# Patient Record
Sex: Female | Born: 1955 | Race: White | Hispanic: No | Marital: Married | Smoking: Former smoker
Health system: Southern US, Community
[De-identification: ages and names within clinical notes are randomized; demographics above are authoritative.]

## PROBLEM LIST (undated history)

## (undated) DIAGNOSIS — E7800 Pure hypercholesterolemia, unspecified: Secondary | ICD-10-CM

## (undated) DIAGNOSIS — T8859XA Other complications of anesthesia, initial encounter: Secondary | ICD-10-CM

## (undated) DIAGNOSIS — J45909 Unspecified asthma, uncomplicated: Secondary | ICD-10-CM

## (undated) DIAGNOSIS — C50919 Malignant neoplasm of unspecified site of unspecified female breast: Secondary | ICD-10-CM

## (undated) DIAGNOSIS — G629 Polyneuropathy, unspecified: Secondary | ICD-10-CM

## (undated) DIAGNOSIS — F32A Depression, unspecified: Secondary | ICD-10-CM

## (undated) DIAGNOSIS — R569 Unspecified convulsions: Secondary | ICD-10-CM

## (undated) DIAGNOSIS — Z8619 Personal history of other infectious and parasitic diseases: Secondary | ICD-10-CM

## (undated) DIAGNOSIS — G35 Multiple sclerosis: Secondary | ICD-10-CM

## (undated) DIAGNOSIS — B029 Zoster without complications: Secondary | ICD-10-CM

## (undated) DIAGNOSIS — T7840XA Allergy, unspecified, initial encounter: Secondary | ICD-10-CM

## (undated) DIAGNOSIS — R911 Solitary pulmonary nodule: Secondary | ICD-10-CM

## (undated) DIAGNOSIS — K635 Polyp of colon: Secondary | ICD-10-CM

## (undated) DIAGNOSIS — R42 Dizziness and giddiness: Secondary | ICD-10-CM

## (undated) DIAGNOSIS — M722 Plantar fascial fibromatosis: Secondary | ICD-10-CM

## (undated) DIAGNOSIS — K5190 Ulcerative colitis, unspecified, without complications: Secondary | ICD-10-CM

## (undated) DIAGNOSIS — G709 Myoneural disorder, unspecified: Secondary | ICD-10-CM

## (undated) DIAGNOSIS — G35D Multiple sclerosis, unspecified: Secondary | ICD-10-CM

## (undated) DIAGNOSIS — L989 Disorder of the skin and subcutaneous tissue, unspecified: Principal | ICD-10-CM

## (undated) DIAGNOSIS — J449 Chronic obstructive pulmonary disease, unspecified: Secondary | ICD-10-CM

## (undated) DIAGNOSIS — N959 Unspecified menopausal and perimenopausal disorder: Secondary | ICD-10-CM

## (undated) DIAGNOSIS — F419 Anxiety disorder, unspecified: Secondary | ICD-10-CM

## (undated) DIAGNOSIS — K219 Gastro-esophageal reflux disease without esophagitis: Secondary | ICD-10-CM

## (undated) DIAGNOSIS — F329 Major depressive disorder, single episode, unspecified: Secondary | ICD-10-CM

## (undated) DIAGNOSIS — G40909 Epilepsy, unspecified, not intractable, without status epilepticus: Secondary | ICD-10-CM

## (undated) DIAGNOSIS — H269 Unspecified cataract: Secondary | ICD-10-CM

## (undated) DIAGNOSIS — K2970 Gastritis, unspecified, without bleeding: Secondary | ICD-10-CM

## (undated) HISTORY — DX: Epilepsy, unspecified, not intractable, without status epilepticus: G40.909

## (undated) HISTORY — DX: Polyp of colon: K63.5

## (undated) HISTORY — DX: Polyneuropathy, unspecified: G62.9

## (undated) HISTORY — DX: Unspecified cataract: H26.9

## (undated) HISTORY — PX: ROTATOR CUFF REPAIR: SHX139

## (undated) HISTORY — DX: Malignant neoplasm of unspecified site of unspecified female breast: C50.919

## (undated) HISTORY — DX: Dizziness and giddiness: R42

## (undated) HISTORY — DX: Allergy, unspecified, initial encounter: T78.40XA

## (undated) HISTORY — DX: Ulcerative colitis, unspecified, without complications: K51.90

## (undated) HISTORY — DX: Unspecified menopausal and perimenopausal disorder: N95.9

## (undated) HISTORY — PX: FOOT SURGERY: SHX648

## (undated) HISTORY — DX: Zoster without complications: B02.9

## (undated) HISTORY — DX: Anxiety disorder, unspecified: F41.9

## (undated) HISTORY — DX: Plantar fascial fibromatosis: M72.2

## (undated) HISTORY — PX: TONSILLECTOMY: SUR1361

## (undated) HISTORY — DX: Depression, unspecified: F32.A

## (undated) HISTORY — DX: Unspecified convulsions: R56.9

## (undated) HISTORY — DX: Disorder of the skin and subcutaneous tissue, unspecified: L98.9

## (undated) HISTORY — DX: Myoneural disorder, unspecified: G70.9

## (undated) HISTORY — DX: Multiple sclerosis, unspecified: G35.D

## (undated) HISTORY — PX: DILATION AND CURETTAGE OF UTERUS: SHX78

## (undated) HISTORY — PX: CHOLECYSTECTOMY: SHX55

## (undated) HISTORY — PX: UPPER GASTROINTESTINAL ENDOSCOPY: SHX188

## (undated) HISTORY — DX: Chronic obstructive pulmonary disease, unspecified: J44.9

## (undated) HISTORY — PX: TUBAL LIGATION: SHX77

## (undated) HISTORY — DX: Multiple sclerosis: G35

## (undated) HISTORY — DX: Personal history of other infectious and parasitic diseases: Z86.19

## (undated) HISTORY — DX: Gastro-esophageal reflux disease without esophagitis: K21.9

## (undated) HISTORY — DX: Major depressive disorder, single episode, unspecified: F32.9

## (undated) HISTORY — DX: Solitary pulmonary nodule: R91.1

## (undated) HISTORY — DX: Gastritis, unspecified, without bleeding: K29.70

---

## 1994-12-23 ENCOUNTER — Encounter (INDEPENDENT_AMBULATORY_CARE_PROVIDER_SITE_OTHER): Payer: Self-pay | Admitting: *Deleted

## 1999-03-12 ENCOUNTER — Other Ambulatory Visit: Admission: RE | Admit: 1999-03-12 | Discharge: 1999-03-12 | Payer: Self-pay | Admitting: Family Medicine

## 1999-04-06 ENCOUNTER — Encounter: Admission: RE | Admit: 1999-04-06 | Discharge: 1999-04-06 | Payer: Self-pay | Admitting: Family Medicine

## 1999-04-06 ENCOUNTER — Encounter: Payer: Self-pay | Admitting: Family Medicine

## 1999-06-11 ENCOUNTER — Other Ambulatory Visit: Admission: RE | Admit: 1999-06-11 | Discharge: 1999-06-11 | Payer: Self-pay | Admitting: Family Medicine

## 1999-07-10 ENCOUNTER — Encounter (INDEPENDENT_AMBULATORY_CARE_PROVIDER_SITE_OTHER): Payer: Self-pay | Admitting: Specialist

## 1999-07-10 ENCOUNTER — Other Ambulatory Visit: Admission: RE | Admit: 1999-07-10 | Discharge: 1999-07-10 | Payer: Self-pay | Admitting: *Deleted

## 1999-12-22 ENCOUNTER — Ambulatory Visit (HOSPITAL_COMMUNITY): Admission: RE | Admit: 1999-12-22 | Discharge: 1999-12-22 | Payer: Self-pay | Admitting: Neurology

## 1999-12-22 ENCOUNTER — Encounter: Payer: Self-pay | Admitting: Neurology

## 1999-12-23 ENCOUNTER — Encounter: Payer: Self-pay | Admitting: Internal Medicine

## 1999-12-23 ENCOUNTER — Emergency Department (HOSPITAL_COMMUNITY): Admission: EM | Admit: 1999-12-23 | Discharge: 1999-12-23 | Payer: Self-pay | Admitting: Emergency Medicine

## 1999-12-25 ENCOUNTER — Other Ambulatory Visit: Admission: RE | Admit: 1999-12-25 | Discharge: 1999-12-25 | Payer: Self-pay | Admitting: *Deleted

## 2002-03-08 HISTORY — PX: CERVICAL DISCECTOMY: SHX98

## 2002-03-27 ENCOUNTER — Emergency Department (HOSPITAL_COMMUNITY): Admission: EM | Admit: 2002-03-27 | Discharge: 2002-03-27 | Payer: Self-pay | Admitting: Emergency Medicine

## 2002-03-27 ENCOUNTER — Encounter: Payer: Self-pay | Admitting: Emergency Medicine

## 2002-05-08 ENCOUNTER — Encounter: Payer: Self-pay | Admitting: Neurology

## 2002-05-08 ENCOUNTER — Ambulatory Visit (HOSPITAL_COMMUNITY): Admission: RE | Admit: 2002-05-08 | Discharge: 2002-05-08 | Payer: Self-pay | Admitting: Neurology

## 2002-05-09 ENCOUNTER — Encounter: Payer: Self-pay | Admitting: Family Medicine

## 2002-05-09 ENCOUNTER — Encounter: Admission: RE | Admit: 2002-05-09 | Discharge: 2002-05-09 | Payer: Self-pay | Admitting: Family Medicine

## 2002-05-23 ENCOUNTER — Other Ambulatory Visit: Admission: RE | Admit: 2002-05-23 | Discharge: 2002-05-23 | Payer: Self-pay | Admitting: Family Medicine

## 2002-08-09 ENCOUNTER — Encounter: Payer: Self-pay | Admitting: Neurology

## 2002-08-09 ENCOUNTER — Ambulatory Visit (HOSPITAL_COMMUNITY): Admission: RE | Admit: 2002-08-09 | Discharge: 2002-08-09 | Payer: Self-pay | Admitting: Neurology

## 2002-08-23 ENCOUNTER — Encounter: Admission: RE | Admit: 2002-08-23 | Discharge: 2002-09-20 | Payer: Self-pay | Admitting: Neurology

## 2002-10-22 ENCOUNTER — Encounter: Payer: Self-pay | Admitting: Family Medicine

## 2002-11-07 ENCOUNTER — Inpatient Hospital Stay (HOSPITAL_COMMUNITY): Admission: RE | Admit: 2002-11-07 | Discharge: 2002-11-08 | Payer: Self-pay | Admitting: Neurosurgery

## 2002-11-07 ENCOUNTER — Encounter: Payer: Self-pay | Admitting: Neurosurgery

## 2003-01-24 ENCOUNTER — Encounter: Admission: RE | Admit: 2003-01-24 | Discharge: 2003-01-24 | Payer: Self-pay | Admitting: Neurosurgery

## 2003-07-03 ENCOUNTER — Encounter: Admission: RE | Admit: 2003-07-03 | Discharge: 2003-07-03 | Payer: Self-pay | Admitting: Family Medicine

## 2003-07-29 ENCOUNTER — Encounter: Payer: Self-pay | Admitting: Family Medicine

## 2003-07-29 ENCOUNTER — Other Ambulatory Visit: Admission: RE | Admit: 2003-07-29 | Discharge: 2003-07-29 | Payer: Self-pay | Admitting: Family Medicine

## 2003-07-29 LAB — CONVERTED CEMR LAB: Pap Smear: NORMAL

## 2003-08-09 ENCOUNTER — Encounter: Admission: RE | Admit: 2003-08-09 | Discharge: 2003-08-09 | Payer: Self-pay | Admitting: Family Medicine

## 2003-09-12 ENCOUNTER — Emergency Department (HOSPITAL_COMMUNITY): Admission: EM | Admit: 2003-09-12 | Discharge: 2003-09-12 | Payer: Self-pay | Admitting: Emergency Medicine

## 2003-11-15 ENCOUNTER — Encounter: Admission: RE | Admit: 2003-11-15 | Discharge: 2003-12-19 | Payer: Self-pay | Admitting: Neurology

## 2004-01-16 ENCOUNTER — Ambulatory Visit: Payer: Self-pay | Admitting: Family Medicine

## 2004-02-18 ENCOUNTER — Ambulatory Visit: Payer: Self-pay | Admitting: Family Medicine

## 2004-02-25 ENCOUNTER — Ambulatory Visit: Payer: Self-pay | Admitting: Family Medicine

## 2004-03-26 ENCOUNTER — Ambulatory Visit: Payer: Self-pay | Admitting: Family Medicine

## 2004-04-02 ENCOUNTER — Encounter: Admission: RE | Admit: 2004-04-02 | Discharge: 2004-04-02 | Payer: Self-pay | Admitting: Family Medicine

## 2004-04-02 ENCOUNTER — Ambulatory Visit: Payer: Self-pay | Admitting: Family Medicine

## 2004-07-03 ENCOUNTER — Ambulatory Visit: Payer: Self-pay | Admitting: Family Medicine

## 2004-07-03 ENCOUNTER — Encounter: Admission: RE | Admit: 2004-07-03 | Discharge: 2004-07-03 | Payer: Self-pay | Admitting: Family Medicine

## 2004-10-12 ENCOUNTER — Ambulatory Visit: Payer: Self-pay | Admitting: Family Medicine

## 2004-10-15 ENCOUNTER — Encounter: Admission: RE | Admit: 2004-10-15 | Discharge: 2004-10-15 | Payer: Self-pay | Admitting: Family Medicine

## 2004-11-23 ENCOUNTER — Ambulatory Visit (HOSPITAL_COMMUNITY): Admission: RE | Admit: 2004-11-23 | Discharge: 2004-11-23 | Payer: Self-pay | Admitting: Obstetrics and Gynecology

## 2004-11-23 ENCOUNTER — Encounter (INDEPENDENT_AMBULATORY_CARE_PROVIDER_SITE_OTHER): Payer: Self-pay | Admitting: Specialist

## 2004-12-29 ENCOUNTER — Ambulatory Visit: Payer: Self-pay | Admitting: Family Medicine

## 2005-01-13 ENCOUNTER — Ambulatory Visit: Payer: Self-pay | Admitting: Family Medicine

## 2005-03-27 ENCOUNTER — Emergency Department (HOSPITAL_COMMUNITY): Admission: EM | Admit: 2005-03-27 | Discharge: 2005-03-28 | Payer: Self-pay | Admitting: Emergency Medicine

## 2005-07-21 ENCOUNTER — Ambulatory Visit: Payer: Self-pay | Admitting: Family Medicine

## 2005-09-14 ENCOUNTER — Ambulatory Visit: Payer: Self-pay | Admitting: Family Medicine

## 2006-02-06 ENCOUNTER — Encounter: Payer: Self-pay | Admitting: Family Medicine

## 2006-02-06 ENCOUNTER — Emergency Department: Payer: Self-pay | Admitting: Emergency Medicine

## 2006-02-06 LAB — CONVERTED CEMR LAB: Blood Glucose, Fasting: 84 mg/dL

## 2006-02-10 ENCOUNTER — Ambulatory Visit: Payer: Self-pay | Admitting: Family Medicine

## 2006-03-09 ENCOUNTER — Ambulatory Visit: Payer: Self-pay | Admitting: General Surgery

## 2006-03-10 ENCOUNTER — Ambulatory Visit: Payer: Self-pay | Admitting: Family Medicine

## 2006-03-29 ENCOUNTER — Ambulatory Visit: Payer: Self-pay | Admitting: General Surgery

## 2006-04-21 ENCOUNTER — Ambulatory Visit: Payer: Self-pay | Admitting: Family Medicine

## 2006-04-21 LAB — CONVERTED CEMR LAB
ALT: 18 units/L (ref 0–40)
AST: 21 units/L (ref 0–37)
Albumin: 3.7 g/dL (ref 3.5–5.2)
Alkaline Phosphatase: 61 units/L (ref 39–117)
Basophils Absolute: 0 10*3/uL (ref 0.0–0.1)
Basophils Relative: 0.3 % (ref 0.0–1.0)
Bilirubin, Direct: 0.2 mg/dL (ref 0.0–0.3)
Eosinophils Absolute: 0.2 10*3/uL (ref 0.0–0.6)
Eosinophils Relative: 2.2 % (ref 0.0–5.0)
HCT: 41.1 % (ref 36.0–46.0)
Hemoglobin: 14.4 g/dL (ref 12.0–15.0)
Lymphocytes Relative: 28.2 % (ref 12.0–46.0)
MCHC: 35.1 g/dL (ref 30.0–36.0)
MCV: 97.3 fL (ref 78.0–100.0)
Monocytes Absolute: 0.4 10*3/uL (ref 0.2–0.7)
Monocytes Relative: 5.7 % (ref 3.0–11.0)
Neutro Abs: 4.6 10*3/uL (ref 1.4–7.7)
Neutrophils Relative %: 63.6 % (ref 43.0–77.0)
Platelets: 201 10*3/uL (ref 150–400)
RBC: 4.23 M/uL (ref 3.87–5.11)
RDW: 12.8 % (ref 11.5–14.6)
Total Bilirubin: 0.9 mg/dL (ref 0.3–1.2)
Total Protein: 6.8 g/dL (ref 6.0–8.3)
WBC: 7.2 10*3/uL (ref 4.5–10.5)

## 2006-04-28 ENCOUNTER — Encounter: Payer: Self-pay | Admitting: Family Medicine

## 2006-05-18 ENCOUNTER — Encounter: Payer: Self-pay | Admitting: Family Medicine

## 2006-09-30 ENCOUNTER — Encounter: Payer: Self-pay | Admitting: Family Medicine

## 2006-09-30 DIAGNOSIS — G35 Multiple sclerosis: Secondary | ICD-10-CM | POA: Insufficient documentation

## 2006-09-30 DIAGNOSIS — E162 Hypoglycemia, unspecified: Secondary | ICD-10-CM | POA: Insufficient documentation

## 2006-09-30 DIAGNOSIS — R42 Dizziness and giddiness: Secondary | ICD-10-CM | POA: Insufficient documentation

## 2006-09-30 DIAGNOSIS — R32 Unspecified urinary incontinence: Secondary | ICD-10-CM | POA: Insufficient documentation

## 2006-09-30 DIAGNOSIS — E7800 Pure hypercholesterolemia, unspecified: Secondary | ICD-10-CM | POA: Insufficient documentation

## 2006-09-30 DIAGNOSIS — R569 Unspecified convulsions: Secondary | ICD-10-CM | POA: Insufficient documentation

## 2006-09-30 DIAGNOSIS — K219 Gastro-esophageal reflux disease without esophagitis: Secondary | ICD-10-CM | POA: Insufficient documentation

## 2006-09-30 DIAGNOSIS — N6019 Diffuse cystic mastopathy of unspecified breast: Secondary | ICD-10-CM | POA: Insufficient documentation

## 2006-10-03 ENCOUNTER — Ambulatory Visit: Payer: Self-pay | Admitting: Family Medicine

## 2006-10-03 DIAGNOSIS — R609 Edema, unspecified: Secondary | ICD-10-CM | POA: Insufficient documentation

## 2006-10-03 LAB — CONVERTED CEMR LAB
Bilirubin Urine: NEGATIVE
Blood in Urine, dipstick: NEGATIVE
Glucose, Urine, Semiquant: NEGATIVE
Ketones, urine, test strip: NEGATIVE
Nitrite: NEGATIVE
Protein, U semiquant: NEGATIVE
Specific Gravity, Urine: 1.015
Urobilinogen, UA: 0.2
WBC Urine, dipstick: NEGATIVE
pH: 6

## 2006-10-04 ENCOUNTER — Encounter: Payer: Self-pay | Admitting: Family Medicine

## 2006-10-06 ENCOUNTER — Encounter (INDEPENDENT_AMBULATORY_CARE_PROVIDER_SITE_OTHER): Payer: Self-pay | Admitting: *Deleted

## 2006-10-06 LAB — CONVERTED CEMR LAB
ALT: 21 units/L (ref 0–35)
AST: 23 units/L (ref 0–37)
Albumin: 4.3 g/dL (ref 3.5–5.2)
Alkaline Phosphatase: 67 units/L (ref 39–117)
BUN: 11 mg/dL (ref 6–23)
Basophils Absolute: 0 10*3/uL (ref 0.0–0.1)
Basophils Relative: 0.4 % (ref 0.0–1.0)
Bilirubin, Direct: 0.1 mg/dL (ref 0.0–0.3)
CO2: 30 meq/L (ref 19–32)
Calcium: 10 mg/dL (ref 8.4–10.5)
Chloride: 104 meq/L (ref 96–112)
Creatinine, Ser: 1.1 mg/dL (ref 0.4–1.2)
Eosinophils Absolute: 0.3 10*3/uL (ref 0.0–0.6)
Eosinophils Relative: 3.1 % (ref 0.0–5.0)
GFR calc Af Amer: 68 mL/min
GFR calc non Af Amer: 56 mL/min
Glucose, Bld: 83 mg/dL (ref 70–99)
HCT: 42 % (ref 36.0–46.0)
Hemoglobin: 14.5 g/dL (ref 12.0–15.0)
Hgb A1c MFr Bld: 5.7 % (ref 4.6–6.0)
Lymphocytes Relative: 38.7 % (ref 12.0–46.0)
MCHC: 34.6 g/dL (ref 30.0–36.0)
MCV: 97.3 fL (ref 78.0–100.0)
Monocytes Absolute: 0.3 10*3/uL (ref 0.2–0.7)
Monocytes Relative: 3.8 % (ref 3.0–11.0)
Neutro Abs: 4.4 10*3/uL (ref 1.4–7.7)
Neutrophils Relative %: 54 % (ref 43.0–77.0)
Phosphorus: 4.6 mg/dL (ref 2.3–4.6)
Platelets: 188 10*3/uL (ref 150–400)
Potassium: 4.4 meq/L (ref 3.5–5.1)
RBC: 4.32 M/uL (ref 3.87–5.11)
RDW: 12.8 % (ref 11.5–14.6)
Sodium: 141 meq/L (ref 135–145)
TSH: 1.88 microintl units/mL (ref 0.35–5.50)
Total Bilirubin: 0.7 mg/dL (ref 0.3–1.2)
Total Protein: 7.4 g/dL (ref 6.0–8.3)
WBC: 8.2 10*3/uL (ref 4.5–10.5)

## 2006-10-17 ENCOUNTER — Ambulatory Visit: Payer: Self-pay | Admitting: Family Medicine

## 2006-10-17 DIAGNOSIS — G629 Polyneuropathy, unspecified: Secondary | ICD-10-CM | POA: Insufficient documentation

## 2006-10-17 DIAGNOSIS — F17200 Nicotine dependence, unspecified, uncomplicated: Secondary | ICD-10-CM | POA: Insufficient documentation

## 2006-10-17 DIAGNOSIS — Z87891 Personal history of nicotine dependence: Secondary | ICD-10-CM | POA: Insufficient documentation

## 2006-10-17 DIAGNOSIS — M79609 Pain in unspecified limb: Secondary | ICD-10-CM | POA: Insufficient documentation

## 2006-10-18 LAB — CONVERTED CEMR LAB
ALT: 18 units/L (ref 0–35)
AST: 26 units/L (ref 0–37)
Albumin: 3.6 g/dL (ref 3.5–5.2)
BUN: 10 mg/dL (ref 6–23)
CO2: 29 meq/L (ref 19–32)
Calcium: 9.2 mg/dL (ref 8.4–10.5)
Chloride: 105 meq/L (ref 96–112)
Creatinine, Ser: 1.1 mg/dL (ref 0.4–1.2)
Folate: 9.1 ng/mL
GFR calc Af Amer: 68 mL/min
GFR calc non Af Amer: 56 mL/min
Glucose, Bld: 106 mg/dL — ABNORMAL HIGH (ref 70–99)
Hgb A1c MFr Bld: 5.5 % (ref 4.6–6.0)
Phosphorus: 3.3 mg/dL (ref 2.3–4.6)
Potassium: 4.4 meq/L (ref 3.5–5.1)
Pro B Natriuretic peptide (BNP): 70 pg/mL (ref 0.0–100.0)
Sed Rate: 18 mm/hr (ref 0–25)
Sodium: 140 meq/L (ref 135–145)
TSH: 1.76 microintl units/mL (ref 0.35–5.50)
Vitamin B-12: 304 pg/mL (ref 211–911)

## 2007-02-07 ENCOUNTER — Ambulatory Visit: Payer: Self-pay | Admitting: Family Medicine

## 2007-02-07 LAB — CONVERTED CEMR LAB
Bilirubin Urine: NEGATIVE
Blood in Urine, dipstick: NEGATIVE
Casts: 0 /lpf
Epithelial cells, urine: 1 /lpf
Glucose, Urine, Semiquant: NEGATIVE
Ketones, urine, test strip: NEGATIVE
Nitrite: POSITIVE
Protein, U semiquant: NEGATIVE
Specific Gravity, Urine: 1.01
Urine crystals, microscopic: 0 /hpf
Urobilinogen, UA: 0.2
WBC Urine, dipstick: NEGATIVE
Yeast, UA: 0
pH: 6

## 2007-02-08 ENCOUNTER — Encounter: Payer: Self-pay | Admitting: Family Medicine

## 2007-02-13 ENCOUNTER — Encounter (INDEPENDENT_AMBULATORY_CARE_PROVIDER_SITE_OTHER): Payer: Self-pay | Admitting: *Deleted

## 2007-04-27 ENCOUNTER — Encounter: Admission: RE | Admit: 2007-04-27 | Discharge: 2007-04-27 | Payer: Self-pay | Admitting: Family Medicine

## 2007-05-03 ENCOUNTER — Encounter (INDEPENDENT_AMBULATORY_CARE_PROVIDER_SITE_OTHER): Payer: Self-pay | Admitting: *Deleted

## 2007-06-30 ENCOUNTER — Ambulatory Visit: Payer: Self-pay | Admitting: Family Medicine

## 2007-07-07 ENCOUNTER — Ambulatory Visit: Payer: Self-pay | Admitting: Cardiology

## 2007-07-14 ENCOUNTER — Ambulatory Visit: Payer: Self-pay

## 2007-07-14 ENCOUNTER — Encounter: Payer: Self-pay | Admitting: Family Medicine

## 2007-07-27 ENCOUNTER — Ambulatory Visit: Payer: Self-pay | Admitting: Family Medicine

## 2007-07-31 LAB — CONVERTED CEMR LAB
ALT: 24 units/L (ref 0–35)
AST: 24 units/L (ref 0–37)
Albumin: 4.1 g/dL (ref 3.5–5.2)
Alkaline Phosphatase: 72 units/L (ref 39–117)
BUN: 10 mg/dL (ref 6–23)
Basophils Absolute: 0 10*3/uL (ref 0.0–0.1)
Basophils Relative: 0.4 % (ref 0.0–1.0)
Bilirubin, Direct: 0.1 mg/dL (ref 0.0–0.3)
CO2: 26 meq/L (ref 19–32)
Calcium: 9.5 mg/dL (ref 8.4–10.5)
Chloride: 104 meq/L (ref 96–112)
Cholesterol: 141 mg/dL (ref 0–200)
Creatinine, Ser: 1 mg/dL (ref 0.4–1.2)
Eosinophils Absolute: 0.3 10*3/uL (ref 0.0–0.7)
Eosinophils Relative: 3.7 % (ref 0.0–5.0)
GFR calc Af Amer: 76 mL/min
GFR calc non Af Amer: 63 mL/min
Glucose, Bld: 100 mg/dL — ABNORMAL HIGH (ref 70–99)
HCT: 43.4 % (ref 36.0–46.0)
HDL: 41.6 mg/dL (ref >39.0)
Hemoglobin: 14.7 g/dL (ref 12.0–15.0)
LDL Cholesterol: 83 mg/dL (ref 0–99)
Lymphocytes Relative: 30.1 % (ref 12.0–46.0)
MCHC: 33.9 g/dL (ref 30.0–36.0)
MCV: 98.8 fL (ref 78.0–100.0)
Monocytes Absolute: 0.6 10*3/uL (ref 0.1–1.0)
Monocytes Relative: 7.6 % (ref 3.0–12.0)
Neutro Abs: 4.4 10*3/uL (ref 1.4–7.7)
Neutrophils Relative %: 58.2 % (ref 43.0–77.0)
Phosphorus: 3.7 mg/dL (ref 2.3–4.6)
Platelets: 181 10*3/uL (ref 150–400)
Potassium: 3.9 meq/L (ref 3.5–5.1)
RBC: 4.39 M/uL (ref 3.87–5.11)
RDW: 12.6 % (ref 11.5–14.6)
Sodium: 139 meq/L (ref 135–145)
TSH: 2.18 microintl units/mL (ref 0.35–5.50)
Total Bilirubin: 0.9 mg/dL (ref 0.3–1.2)
Total CHOL/HDL Ratio: 3.4
Total Protein: 7.2 g/dL (ref 6.0–8.3)
Triglycerides: 82 mg/dL (ref 0–149)
VLDL: 16 mg/dL (ref 0–40)
WBC: 7.6 10*3/uL (ref 4.5–10.5)

## 2007-08-01 ENCOUNTER — Ambulatory Visit: Payer: Self-pay | Admitting: Family Medicine

## 2007-08-01 DIAGNOSIS — J309 Allergic rhinitis, unspecified: Secondary | ICD-10-CM | POA: Insufficient documentation

## 2007-11-20 ENCOUNTER — Encounter: Payer: Self-pay | Admitting: Family Medicine

## 2007-11-22 ENCOUNTER — Encounter: Admission: RE | Admit: 2007-11-22 | Discharge: 2007-11-22 | Payer: Self-pay | Admitting: Family Medicine

## 2007-11-22 ENCOUNTER — Ambulatory Visit: Payer: Self-pay | Admitting: Family Medicine

## 2007-11-24 ENCOUNTER — Telehealth (INDEPENDENT_AMBULATORY_CARE_PROVIDER_SITE_OTHER): Payer: Self-pay | Admitting: *Deleted

## 2007-11-28 ENCOUNTER — Encounter: Payer: Self-pay | Admitting: Family Medicine

## 2007-12-12 ENCOUNTER — Ambulatory Visit: Payer: Self-pay | Admitting: Family Medicine

## 2007-12-12 DIAGNOSIS — M2550 Pain in unspecified joint: Secondary | ICD-10-CM | POA: Insufficient documentation

## 2007-12-12 LAB — CONVERTED CEMR LAB
Basophils Absolute: 0 10*3/uL (ref 0.0–0.1)
Basophils Relative: 0.3 % (ref 0.0–3.0)
Eosinophils Absolute: 0.2 10*3/uL (ref 0.0–0.7)
Eosinophils Relative: 3.3 % (ref 0.0–5.0)
HCT: 40.8 % (ref 36.0–46.0)
Hemoglobin: 14 g/dL (ref 12.0–15.0)
Lymphocytes Relative: 30.7 % (ref 12.0–46.0)
MCHC: 34.2 g/dL (ref 30.0–36.0)
MCV: 97.6 fL (ref 78.0–100.0)
Monocytes Absolute: 0.3 10*3/uL (ref 0.1–1.0)
Monocytes Relative: 4.7 % (ref 3.0–12.0)
Neutro Abs: 4.1 10*3/uL (ref 1.4–7.7)
Neutrophils Relative %: 61 % (ref 43.0–77.0)
Platelets: 170 10*3/uL (ref 150–400)
RBC: 4.18 M/uL (ref 3.87–5.11)
RDW: 12.8 % (ref 11.5–14.6)
Rhuematoid fact SerPl-aCnc: <20 intl units/mL — ABNORMAL LOW (ref 0.0–20.0)
Sed Rate: 23 mm/hr — ABNORMAL HIGH (ref 0–22)
WBC: 6.7 10*3/uL (ref 4.5–10.5)

## 2007-12-14 LAB — CONVERTED CEMR LAB: Anti Nuclear Antibody(ANA): NEGATIVE

## 2008-01-05 ENCOUNTER — Encounter: Payer: Self-pay | Admitting: Family Medicine

## 2008-03-22 ENCOUNTER — Ambulatory Visit: Payer: Self-pay | Admitting: Family Medicine

## 2008-03-22 DIAGNOSIS — M19019 Primary osteoarthritis, unspecified shoulder: Secondary | ICD-10-CM | POA: Insufficient documentation

## 2008-03-22 DIAGNOSIS — M719 Bursopathy, unspecified: Secondary | ICD-10-CM

## 2008-03-22 DIAGNOSIS — M67919 Unspecified disorder of synovium and tendon, unspecified shoulder: Secondary | ICD-10-CM | POA: Insufficient documentation

## 2008-03-27 ENCOUNTER — Encounter: Admission: RE | Admit: 2008-03-27 | Discharge: 2008-03-27 | Payer: Self-pay | Admitting: Family Medicine

## 2008-04-30 ENCOUNTER — Encounter: Admission: RE | Admit: 2008-04-30 | Discharge: 2008-04-30 | Payer: Self-pay | Admitting: Family Medicine

## 2008-05-02 ENCOUNTER — Encounter: Payer: Self-pay | Admitting: Family Medicine

## 2008-05-02 LAB — HM MAMMOGRAPHY: HM Mammogram: NORMAL

## 2008-05-20 ENCOUNTER — Telehealth: Payer: Self-pay | Admitting: Family Medicine

## 2008-06-01 DIAGNOSIS — Z9889 Other specified postprocedural states: Secondary | ICD-10-CM | POA: Insufficient documentation

## 2008-06-01 DIAGNOSIS — Z9089 Acquired absence of other organs: Secondary | ICD-10-CM | POA: Insufficient documentation

## 2008-06-01 DIAGNOSIS — Z9189 Other specified personal risk factors, not elsewhere classified: Secondary | ICD-10-CM | POA: Insufficient documentation

## 2008-06-01 DIAGNOSIS — Z8669 Personal history of other diseases of the nervous system and sense organs: Secondary | ICD-10-CM | POA: Insufficient documentation

## 2008-07-23 ENCOUNTER — Ambulatory Visit: Payer: Self-pay | Admitting: Family Medicine

## 2008-07-23 DIAGNOSIS — E559 Vitamin D deficiency, unspecified: Secondary | ICD-10-CM | POA: Insufficient documentation

## 2008-07-23 LAB — CONVERTED CEMR LAB
Bacteria, UA: 0
Bilirubin Urine: NEGATIVE
Blood in Urine, dipstick: NEGATIVE
Casts: 0 /lpf
Epithelial cells, urine: 1 /lpf
Glucose, Urine, Semiquant: NEGATIVE
Ketones, urine, test strip: NEGATIVE
Nitrite: NEGATIVE
Protein, U semiquant: NEGATIVE
RBC / HPF: 0
Specific Gravity, Urine: 1.015
Urine crystals, microscopic: 0 /hpf
Urobilinogen, UA: 0.2
WBC Urine, dipstick: NEGATIVE
WBC, UA: 0 cells/hpf
Yeast, UA: 0
pH: 6

## 2008-07-25 LAB — CONVERTED CEMR LAB
ALT: 15 units/L (ref 0–35)
AST: 21 units/L (ref 0–37)
Albumin: 3.7 g/dL (ref 3.5–5.2)
BUN: 11 mg/dL (ref 6–23)
Basophils Absolute: 0.1 10*3/uL (ref 0.0–0.1)
Basophils Relative: 0.9 % (ref 0.0–3.0)
CO2: 31 meq/L (ref 19–32)
Calcium: 9.6 mg/dL (ref 8.4–10.5)
Chloride: 104 meq/L (ref 96–112)
Cholesterol: 155 mg/dL (ref 0–200)
Creatinine, Ser: 0.9 mg/dL (ref 0.4–1.2)
Eosinophils Absolute: 0.1 10*3/uL (ref 0.0–0.7)
Eosinophils Relative: 2 % (ref 0.0–5.0)
Glucose, Bld: 97 mg/dL (ref 70–99)
HCT: 44 % (ref 36.0–46.0)
HDL: 50.6 mg/dL (ref >39.00)
Hemoglobin: 15.2 g/dL — ABNORMAL HIGH (ref 12.0–15.0)
Hgb A1c MFr Bld: 5.9 % (ref 4.6–6.5)
LDL Cholesterol: 89 mg/dL (ref 0–99)
Lymphocytes Relative: 33.9 % (ref 12.0–46.0)
Lymphs Abs: 2.1 10*3/uL (ref 0.7–4.0)
MCHC: 34.5 g/dL (ref 30.0–36.0)
MCV: 97.9 fL (ref 78.0–100.0)
Monocytes Absolute: 0.3 10*3/uL (ref 0.1–1.0)
Monocytes Relative: 5.4 % (ref 3.0–12.0)
Neutro Abs: 3.7 10*3/uL (ref 1.4–7.7)
Neutrophils Relative %: 57.8 % (ref 43.0–77.0)
Phosphorus: 3.8 mg/dL (ref 2.3–4.6)
Platelets: 144 10*3/uL — ABNORMAL LOW (ref 150.0–400.0)
Potassium: 4.8 meq/L (ref 3.5–5.1)
RBC: 4.5 M/uL (ref 3.87–5.11)
RDW: 12.1 % (ref 11.5–14.6)
Sodium: 140 meq/L (ref 135–145)
Total CHOL/HDL Ratio: 3
Triglycerides: 78 mg/dL (ref 0.0–149.0)
VLDL: 15.6 mg/dL (ref 0.0–40.0)
Vit D, 25-Hydroxy: 44 ng/mL (ref 30–89)
WBC: 6.3 10*3/uL (ref 4.5–10.5)

## 2008-07-30 ENCOUNTER — Ambulatory Visit: Payer: Self-pay | Admitting: Gastroenterology

## 2008-07-31 ENCOUNTER — Ambulatory Visit: Payer: Self-pay | Admitting: Family Medicine

## 2008-08-06 DIAGNOSIS — K635 Polyp of colon: Secondary | ICD-10-CM

## 2008-08-06 DIAGNOSIS — K2970 Gastritis, unspecified, without bleeding: Secondary | ICD-10-CM

## 2008-08-06 HISTORY — DX: Polyp of colon: K63.5

## 2008-08-06 HISTORY — PX: ESOPHAGOGASTRODUODENOSCOPY: SHX1529

## 2008-08-06 HISTORY — DX: Gastritis, unspecified, without bleeding: K29.70

## 2008-08-06 HISTORY — PX: COLONOSCOPY: SHX174

## 2008-08-14 ENCOUNTER — Ambulatory Visit: Payer: Self-pay | Admitting: Gastroenterology

## 2008-08-15 ENCOUNTER — Ambulatory Visit: Payer: Self-pay | Admitting: Gastroenterology

## 2008-08-15 ENCOUNTER — Encounter: Payer: Self-pay | Admitting: Gastroenterology

## 2008-08-15 LAB — HM COLONOSCOPY

## 2008-08-19 LAB — CONVERTED CEMR LAB
Fecal Occult Blood: NEGATIVE
OCCULT 1: NEGATIVE
OCCULT 2: NEGATIVE
OCCULT 3: NEGATIVE
OCCULT 4: NEGATIVE
OCCULT 5: NEGATIVE

## 2008-08-21 ENCOUNTER — Encounter: Payer: Self-pay | Admitting: Gastroenterology

## 2008-08-22 ENCOUNTER — Encounter: Payer: Self-pay | Admitting: Gastroenterology

## 2008-10-07 ENCOUNTER — Telehealth: Payer: Self-pay | Admitting: Gastroenterology

## 2008-10-08 ENCOUNTER — Ambulatory Visit: Payer: Self-pay | Admitting: Internal Medicine

## 2008-10-08 DIAGNOSIS — R109 Unspecified abdominal pain: Secondary | ICD-10-CM | POA: Insufficient documentation

## 2008-10-08 DIAGNOSIS — Z860100 Personal history of colon polyps, unspecified: Secondary | ICD-10-CM | POA: Insufficient documentation

## 2008-10-11 ENCOUNTER — Ambulatory Visit: Payer: Self-pay | Admitting: Cardiology

## 2008-10-14 ENCOUNTER — Ambulatory Visit: Payer: Self-pay | Admitting: Family Medicine

## 2008-10-14 DIAGNOSIS — R7303 Prediabetes: Secondary | ICD-10-CM | POA: Insufficient documentation

## 2008-10-14 DIAGNOSIS — J984 Other disorders of lung: Secondary | ICD-10-CM

## 2008-10-14 DIAGNOSIS — R911 Solitary pulmonary nodule: Secondary | ICD-10-CM | POA: Insufficient documentation

## 2008-10-28 ENCOUNTER — Ambulatory Visit: Payer: Self-pay | Admitting: Family Medicine

## 2008-10-30 LAB — CONVERTED CEMR LAB: Hgb A1c MFr Bld: 5.8 % (ref 4.6–6.5)

## 2008-11-06 HISTORY — PX: OTHER SURGICAL HISTORY: SHX169

## 2008-11-07 ENCOUNTER — Ambulatory Visit: Payer: Self-pay | Admitting: Gastroenterology

## 2008-11-07 DIAGNOSIS — K644 Residual hemorrhoidal skin tags: Secondary | ICD-10-CM | POA: Insufficient documentation

## 2008-11-07 DIAGNOSIS — K589 Irritable bowel syndrome without diarrhea: Secondary | ICD-10-CM | POA: Insufficient documentation

## 2008-11-12 ENCOUNTER — Ambulatory Visit: Payer: Self-pay | Admitting: Cardiology

## 2008-12-27 ENCOUNTER — Ambulatory Visit: Payer: Self-pay | Admitting: Family Medicine

## 2008-12-27 DIAGNOSIS — M545 Low back pain, unspecified: Secondary | ICD-10-CM | POA: Insufficient documentation

## 2008-12-31 ENCOUNTER — Encounter: Admission: RE | Admit: 2008-12-31 | Discharge: 2008-12-31 | Payer: Self-pay | Admitting: Family Medicine

## 2009-01-03 ENCOUNTER — Telehealth: Payer: Self-pay | Admitting: Family Medicine

## 2009-01-16 ENCOUNTER — Encounter: Admission: RE | Admit: 2009-01-16 | Discharge: 2009-01-16 | Payer: Self-pay | Admitting: Neurosurgery

## 2009-02-03 ENCOUNTER — Ambulatory Visit: Payer: Self-pay | Admitting: Family Medicine

## 2009-02-05 HISTORY — PX: CT SINUS LTD W/O CM: HXRAD914

## 2009-02-07 ENCOUNTER — Encounter: Admission: RE | Admit: 2009-02-07 | Discharge: 2009-02-25 | Payer: Self-pay | Admitting: Neurosurgery

## 2009-02-12 ENCOUNTER — Ambulatory Visit: Payer: Self-pay | Admitting: Family Medicine

## 2009-02-12 ENCOUNTER — Encounter (INDEPENDENT_AMBULATORY_CARE_PROVIDER_SITE_OTHER): Payer: Self-pay | Admitting: *Deleted

## 2009-02-14 ENCOUNTER — Ambulatory Visit: Payer: Self-pay | Admitting: Internal Medicine

## 2009-02-20 ENCOUNTER — Encounter: Admission: RE | Admit: 2009-02-20 | Discharge: 2009-02-20 | Payer: Self-pay | Admitting: Neurosurgery

## 2009-02-24 ENCOUNTER — Telehealth: Payer: Self-pay | Admitting: Family Medicine

## 2009-03-11 ENCOUNTER — Encounter: Payer: Self-pay | Admitting: Family Medicine

## 2009-03-19 ENCOUNTER — Telehealth (INDEPENDENT_AMBULATORY_CARE_PROVIDER_SITE_OTHER): Payer: Self-pay | Admitting: *Deleted

## 2009-03-21 ENCOUNTER — Ambulatory Visit (HOSPITAL_COMMUNITY): Admission: RE | Admit: 2009-03-21 | Discharge: 2009-03-22 | Payer: Self-pay | Admitting: Neurosurgery

## 2009-04-17 ENCOUNTER — Encounter: Admission: RE | Admit: 2009-04-17 | Discharge: 2009-04-17 | Payer: Self-pay | Admitting: Neurosurgery

## 2009-04-17 ENCOUNTER — Encounter: Payer: Self-pay | Admitting: Family Medicine

## 2009-05-05 ENCOUNTER — Emergency Department: Payer: Self-pay | Admitting: Emergency Medicine

## 2009-06-06 ENCOUNTER — Encounter: Admission: RE | Admit: 2009-06-06 | Discharge: 2009-06-06 | Payer: Self-pay | Admitting: Neurosurgery

## 2009-06-24 ENCOUNTER — Encounter: Payer: Self-pay | Admitting: Family Medicine

## 2009-07-06 HISTORY — PX: OTHER SURGICAL HISTORY: SHX169

## 2009-07-17 ENCOUNTER — Ambulatory Visit: Payer: Self-pay | Admitting: Family Medicine

## 2009-07-17 DIAGNOSIS — F419 Anxiety disorder, unspecified: Secondary | ICD-10-CM | POA: Insufficient documentation

## 2009-07-18 LAB — CONVERTED CEMR LAB
Albumin: 4.2 g/dL (ref 3.5–5.2)
BUN: 10 mg/dL (ref 6–23)
CO2: 29 meq/L (ref 19–32)
Calcium: 9.4 mg/dL (ref 8.4–10.5)
Chloride: 104 meq/L (ref 96–112)
Creatinine, Ser: 0.8 mg/dL (ref 0.4–1.2)
GFR calc non Af Amer: 76.88 mL/min (ref >60)
Glucose, Bld: 91 mg/dL (ref 70–99)
Phosphorus: 3.7 mg/dL (ref 2.3–4.6)
Potassium: 4.6 meq/L (ref 3.5–5.1)
Sodium: 141 meq/L (ref 135–145)

## 2009-07-21 ENCOUNTER — Encounter: Payer: Self-pay | Admitting: Family Medicine

## 2009-07-22 ENCOUNTER — Ambulatory Visit: Payer: Self-pay | Admitting: Internal Medicine

## 2009-09-01 ENCOUNTER — Ambulatory Visit: Payer: Self-pay | Admitting: Family Medicine

## 2009-09-02 ENCOUNTER — Telehealth: Payer: Self-pay | Admitting: Family Medicine

## 2009-09-02 ENCOUNTER — Ambulatory Visit: Payer: Self-pay | Admitting: Psychology

## 2009-09-10 ENCOUNTER — Ambulatory Visit: Payer: Self-pay | Admitting: Psychology

## 2009-09-19 ENCOUNTER — Ambulatory Visit: Payer: Self-pay | Admitting: Psychology

## 2009-09-24 ENCOUNTER — Ambulatory Visit: Payer: Self-pay | Admitting: Psychology

## 2009-10-01 ENCOUNTER — Telehealth: Payer: Self-pay | Admitting: Family Medicine

## 2009-10-01 ENCOUNTER — Ambulatory Visit: Payer: Self-pay | Admitting: Psychology

## 2009-10-08 ENCOUNTER — Ambulatory Visit: Payer: Self-pay | Admitting: Psychology

## 2009-10-13 ENCOUNTER — Ambulatory Visit: Payer: Self-pay | Admitting: Family Medicine

## 2009-10-15 ENCOUNTER — Encounter: Payer: Self-pay | Admitting: Family Medicine

## 2009-10-15 ENCOUNTER — Ambulatory Visit: Payer: Self-pay | Admitting: Psychology

## 2009-10-17 LAB — CONVERTED CEMR LAB
ALT: 20 units/L (ref 0–35)
AST: 24 units/L (ref 0–37)
Albumin: 4.1 g/dL (ref 3.5–5.2)
BUN: 11 mg/dL (ref 6–23)
CO2: 30 meq/L (ref 19–32)
Calcium: 10.1 mg/dL (ref 8.4–10.5)
Chloride: 99 meq/L (ref 96–112)
Cholesterol: 156 mg/dL (ref 0–200)
Creatinine, Ser: 0.9 mg/dL (ref 0.4–1.2)
GFR calc non Af Amer: 67.36 mL/min (ref >60)
Glucose, Bld: 76 mg/dL (ref 70–99)
HDL: 49.9 mg/dL (ref >39.00)
LDL Cholesterol: 80 mg/dL (ref 0–99)
Phosphorus: 3.1 mg/dL (ref 2.3–4.6)
Potassium: 4.7 meq/L (ref 3.5–5.1)
Sodium: 138 meq/L (ref 135–145)
Total CHOL/HDL Ratio: 3
Triglycerides: 130 mg/dL (ref 0.0–149.0)
VLDL: 26 mg/dL (ref 0.0–40.0)
Vit D, 25-Hydroxy: 45 ng/mL (ref 30–89)

## 2009-10-28 ENCOUNTER — Ambulatory Visit: Payer: Self-pay | Admitting: Psychology

## 2009-11-19 ENCOUNTER — Telehealth: Payer: Self-pay | Admitting: Family Medicine

## 2009-11-19 ENCOUNTER — Ambulatory Visit: Payer: Self-pay | Admitting: Psychology

## 2009-11-25 ENCOUNTER — Encounter: Payer: Self-pay | Admitting: Family Medicine

## 2009-11-26 ENCOUNTER — Ambulatory Visit: Payer: Self-pay | Admitting: Family Medicine

## 2009-11-28 ENCOUNTER — Encounter: Payer: Self-pay | Admitting: Family Medicine

## 2009-12-02 ENCOUNTER — Ambulatory Visit: Payer: Self-pay | Admitting: Psychology

## 2009-12-08 ENCOUNTER — Ambulatory Visit: Payer: Self-pay | Admitting: Otolaryngology

## 2010-01-27 ENCOUNTER — Telehealth: Payer: Self-pay | Admitting: Family Medicine

## 2010-04-02 ENCOUNTER — Ambulatory Visit
Admission: RE | Admit: 2010-04-02 | Discharge: 2010-04-02 | Payer: Self-pay | Source: Home / Self Care | Attending: Family Medicine | Admitting: Family Medicine

## 2010-04-02 DIAGNOSIS — H0289 Other specified disorders of eyelid: Secondary | ICD-10-CM | POA: Insufficient documentation

## 2010-04-02 LAB — CONVERTED CEMR LAB
Bacteria, UA: 0
Bilirubin Urine: NEGATIVE
Blood in Urine, dipstick: NEGATIVE
Casts: 0 /lpf
Glucose, Urine, Semiquant: NEGATIVE
Ketones, urine, test strip: NEGATIVE
Mucus, UA: 0
Nitrite: NEGATIVE
Protein, U semiquant: NEGATIVE
RBC / HPF: 0
Specific Gravity, Urine: 1.015
Urine crystals, microscopic: 0 /hpf
Urobilinogen, UA: 0.2
WBC Urine, dipstick: NEGATIVE
WBC, UA: 0 cells/hpf
Yeast, UA: 0
pH: 6

## 2010-04-06 ENCOUNTER — Encounter: Payer: Self-pay | Admitting: Family Medicine

## 2010-04-09 NOTE — Miscellaneous (Signed)
Summary: mammo results   Clinical Lists Changes  Observations: Added new observation of MAMMO DUE: 05/2008 (05/03/2007 8:19) Added new observation of MAMMOGRAM: normal (05/03/2007 8:19)       Preventive Care Screening  Mammogram:    Date:  05/03/2007    Next Due:  05/2008    Results:  normal

## 2010-04-09 NOTE — Assessment & Plan Note (Signed)
Summary: ?SINUS INFECTION/CLE   Vital Signs:  Patient profile:   55 year old female Height:      61.5 inches Weight:      185.50 pounds BMI:     34.61 Temp:     97.9 degrees F oral Pulse rate:   76 / minute Pulse rhythm:   regular BP sitting:   118 / 88  (right arm) Cuff size:   large  Vitals Entered By: Lewanda Rife LPN (Jul 17, 2009 8:47 AM) CC: sinus drainage at back of throat,face hurts and h/a. also wants med for nerves   History of Present Illness: allergies have turned into infectio - now with chest congestion  drainage in throat  some yellow nasal drainage coughs up a lot of yellow d/c  face hurts under eyes  lots of headaches  has been sick for 10 days  no fever   thinks a lot of her headaches are stress driven   (did see ENT this year who recommeded headache workup if not imp)  is very irritable  ? if hormonal  wants to isolate herself  out of prozac  wants to try going up on dose   ? if menopause had novasure years ago - no periods so unsure if menop    still smokes the same- wants to cut down   mole on chest -- brown freckle is larger and darker area below it pink and swollen  wants derm eval  Allergies: 1)  ! Amitriptyline Hcl 2)  ! Lyrica 3)  Biaxin 4)  Lipitor 5)  Sudafed 6)  * Cymbalta 7)  Levaquin  Past History:  Past Surgical History: Last updated: 02/16/2009 ANTERIOR CERVICAL DISKECTOMY and fusion, c4-5, c5-6, c6-7. Dr. Donalee Citrin. 2004 TUBAL LIGATION, HX OF (ICD-V26.51) DILATION AND CURETTAGE, HX OF (ICD-V45.89) TONSILLECTOMY, HX OF (ICD-V45.79) FOOT SURGERY, HX OF (ICD-V15.89) CHOLECYSTECTOMY, HX OF (ICD-V45.79) 9/10 chest CT with small 4mm nodule L lung base (rec re check in 1 yr) 12/10 sinus CT negative  6/10 colonosco- polyps /- re check 5y 6/10 EGD- erosive gastritis , h pylori (treated )  Family History: Last updated: 07/30/2008 Father: CAD 6 bypass Mother: migraines, CAD - died of MI Siblings: 2 brothers, 1  sister cousin- CAD gm- CAD aunt- colon cancer 52s MGF- colon cancer 74s  Social History: Last updated: 07/30/2008 Marital Status: Married Children: 3 Occupation: disabled- from [place] smokes 1/2 to 1ppd  Alcohol Use - no Daily Caffeine Use-2  Risk Factors: Smoking Status: quit (09/30/2006)  Past Medical History: Dizziness or vertigo- chronic  GERD Seizure disorder Urinary incontinence MS plantar fasciitis s/p sx allergic rhinitis ADENOMATOUS COLON POLYPS 08/2008 GASTRITIS /H.PYLORI +08/2008 lung nodule  menopausal disorder anxiety  cardilogy--Dr [facility] neurology-- Dr Sandria Manly   Review of Systems General:  Complains of fatigue and malaise; denies chills, fever, and loss of appetite. Eyes:  Denies blurring, discharge, and eye irritation. ENT:  Complains of earache, nasal congestion, postnasal drainage, sinus pressure, and sore throat; denies ear discharge. CV:  Denies chest pain or discomfort, palpitations, shortness of breath with exertion, and swelling of feet. Resp:  Complains of cough and sputum productive; denies wheezing. GI:  Denies abdominal pain, bloody stools, change in bowel habits, indigestion, nausea, and vomiting. GU:  Denies dysuria and hematuria. MS:  Complains of cramps and stiffness; denies joint redness, joint swelling, and muscle weakness. Derm:  Complains of lesion(s); denies itching, poor wound healing, and rash. Neuro:  Denies numbness and tingling. Psych:  Complains of anxiety,  depression, easily tearful, and irritability; denies panic attacks and sense of great danger. Endo:  Denies cold intolerance and heat intolerance. Heme:  Denies abnormal bruising and bleeding.  Physical Exam  General:  overweight but generally well appearing - seems mildly fatigued  Head:  normocephalic, atraumatic, and no abnormalities observed.  bilat ethmoid and maxillary sinus tenderness  Eyes:  vision grossly intact, pupils equal, pupils round, and pupils reactive to  light.  mild conj injection  Ears:  R ear normal and L ear normal.   Nose:  nares are injected and congested bilaterally  Mouth:  pharynx pink and moist, no erythema, and no exudates.  some clear post nasal drip Neck:  supple with full rom and no masses or thyromegally, no JVD or carotid bruit  Chest Wall:  No deformities, masses, or tenderness noted. Lungs:  CTA with diffusely distant bs no wheeze today no rales or rhonchi Heart:  Normal rate and regular rhythm. S1 and S2 normal without gallop, murmur, click, rub or other extra sounds. Abdomen:  soft, non-tender, and normal bowel sounds.   Msk:  No deformity or scoliosis noted of thoracic or lumbar spine.  no new joint changes  Extremities:  trace left pedal edema and trace right pedal edema.   Neurologic:  sensation intact to light touch, gait normal, and DTRs symmetrical and normal.  no tremor  Skin:  dark 3-4 mm macule on mid chest raised pink 2 mmm area under it  tanned - solar change and aging Cervical Nodes:  No lymphadenopathy noted Inguinal Nodes:  No significant adenopathy Psych:  generally anxious today fair eye contact and comm skills good insignt    Impression & Recommendations:  Problem # 1:  NEOPLASM, SKIN, UNCERTAIN BEHAVIOR (ICD-238.2) ref to derm for maclule and raised lesion on chest Orders: Dermatology Referral (Derma) Prescription Created Electronically [phone])  Problem # 2:  LUNG NODULE (ICD-518.89) re check for change in smoker Orders: Venipuncture (21308) TLB-Renal Function Panel (80069-RENAL) Radiology Referral (Radiology) Prescription Created Electronically [phone])  Problem # 3:  SINUSITIS - ACUTE-NOS (ICD-461.9)  tx with augmentin adv to quit smoking  The following medications were removed from the medication list:    Augmentin 875-125 Mg Tabs (Amoxicillin-pot clavulanate) .Marland Kitchen... 1 by mouth two times a day for 10 days    Zithromax Z-pak 250 Mg Tabs (Azithromycin) .Marland Kitchen... Take by mouth as  directed Her updated medication list for this problem includes:    Flonase 50 Mcg/act Susp (Fluticasone propionate) .Marland Kitchen... 2 sprays in each nostril daily as needed    Robitussin Chest Congestion 100 Mg/75ml Syrp (Guaifenesin) ..... Otc as directed.    Guaifenesin-codeine 100-10 Mg/50ml Syrp (Guaifenesin-codeine) .Marland Kitchen... 1-2 teaspoon by mouth up to every 6 hours as needed cough caution of sedation    Augmentin 875-125 Mg Tabs (Amoxicillin-pot clavulanate) .Marland Kitchen... 1 by mouth two times a day for 10 days for sinus infection  Orders: Prescription Created Electronically [phone])  Problem # 4:  TOBACCO USE (ICD-305.1)  discussed in detail risks of smoking, and possible outcomes including COPD, vascular dz, cancer and also respiratory infections/sinus problems   pt may try electric cigarette unsure if ready to quit   Orders: Prescription Created Electronically [phone])  Problem # 5:  ANXIETY, SITUATIONAL (ICD-308.3) Assessment: New  worsening with father's alz and her stress also menopause causing irritability and mood lability  disc stressors/ coping tech/ support sources/ symptoms/ opt for tx and poss side eff in detail today re stat prozac and then inc to 20  consider counseling  f/u 1-2 mo   Orders: Prescription Created Electronically [phone])  Complete Medication List: 1)  Crestor 20 Mg Tabs (Rosuvastatin calcium) .... Take one half by mouth daily 2)  Prozac 10 Mg Caps (Fluoxetine hcl) .... Take one by mouth once daily for 1 week and then increase to 2 pills once daily by mouth 3)  Flexeril 10 Mg Tabs (Cyclobenzaprine hcl) .... Take one by mouth in am and 2 tabs in pm 4)  Clarinex 5 Mg Tabs (Desloratadine) .Marland Kitchen.. 1 by mouth once daily as needed 5)  Omeprazole 20 Mg Cpdr (Omeprazole) .Marland Kitchen.. 1 by mouth two times a day 6)  Flonase 50 Mcg/act Susp (Fluticasone propionate) .... 2 sprays in each nostril daily as needed 7)  Nystatin 100000 Unit/gm Crea (Nystatin) .... Apply once daily to affected area  as needed 8)  Proventil Hfa 108 (90 Base) Mcg/act Aers (Albuterol sulfate) .... 2 puffs up to every 4 hours as needed wheezing 9)  Gabapentin 600 Mg Tabs (Gabapentin) .... Take one by mouth every 2 hours, total of 6 daily 10)  Beta Seron Shots  .... For ms - dr love 11)  Robinul-forte 2 Mg Tabs (Glycopyrrolate) .Marland Kitchen.. 1 tablet twice a day 12)  Align Caps (Probiotic product) .... Take 1 tab samples given 13)  Analpram E 2.5-1 & 1 % Kit (Hydrocortisone ace-pramoxine) .... Apply two times a day to rectum as needed 14)  Anusol-hc 25 Mg Supp (Hydrocortisone acetate) .... Use rectally two times a day as needed 15)  Robitussin Chest Congestion 100 Mg/62ml Syrp (Guaifenesin) .... Otc as directed. 16)  Guaifenesin-codeine 100-10 Mg/65ml Syrp (Guaifenesin-codeine) .Marland Kitchen.. 1-2 teaspoon by mouth up to every 6 hours as needed cough caution of sedation 17)  Augmentin 875-125 Mg Tabs (Amoxicillin-pot clavulanate) .Marland Kitchen.. 1 by mouth two times a day for 10 days for sinus infection  Patient Instructions: 1)  we will set up chest CT to follow lung nodule at check out  2)  lab today for that  3)  we will ref to derm at check out for moles on chest  4)  take augmentin for respiratory infection as directed 5)  take prozac and inc to 20 mg in a week 6)  follow up with me in 1-2 months about your anxiety  Prescriptions: PROZAC 10 MG  CAPS (FLUOXETINE HCL) take one by mouth once daily for 1 week and then increase to 2 pills once daily by mouth  #60 x 11   Entered and Authorized by:   Judith Part MD   Signed by:   Judith Part MD on 07/17/2009   Method used:   Electron[facility] to        [facility] (retail)       [address]       [place], [place]  [address]       Ph: [phone] or [phone]       Fax: [phone]   RxID:   [id number] AUGMENTIN 875-125 MG TABS (AMOXICILLIN-POT CLAVULANATE) 1 by mouth two times a day for 10 days for sinus infection  #20 x 0   Entered and Authorized by:    Judith Part MD   Signed by:   Judith Part MD on 07/17/2009   Method used:   [facility]        [facility] [id number] ([facility]       [address]       [place], [place]  [address]  Ph: [phone] or [phone]       Fax: [phone]   RxID:   [id number]   Current Allergies (reviewed today): ! AMITRIPTYLINE HCL ! LYRICA BIAXIN LIPITOR SUDAFED * CYMBALTA LEVAQUIN

## 2010-04-09 NOTE — Progress Notes (Signed)
Summary: needs referral to neurosurgeon  Phone Note Call from Patient Call back at Home Phone [phone]   Caller: Patient Call For: Dr. Patsy Lager Summary of Call: Pt cant see Dr. Wynetta Emery because she owes money.  She will need referral to a different neurosurgeon.  She doesnt care where. Initial call taken by: Lowella Petties CMA,  January 03, 2009 2:56 PM  Follow-up for Phone Call        Appt has been made to see Dr Henreitta Cea on 01/14/2009 at 8:30am, patient will pay them the balance owed. Follow-up by: Carlton Adam,  January 09, 2009 4:24 PM

## 2010-04-09 NOTE — Miscellaneous (Signed)
Summary: Controlled Substances Contract  Controlled Substances Contract   Imported By: Maryln Gottron 10/17/2009 11:08:59  _____________________________________________________________________  External Attachment:    Type:   Image     Comment:   External Document

## 2010-04-09 NOTE — Miscellaneous (Signed)
Summary: [facility]  [facility]   Imported By: Beau Fanny 12/05/2007 10:21:41  _____________________________________________________________________  External Attachment:    Type:   Image     Comment:   External Document

## 2010-04-09 NOTE — Letter (Signed)
Summary: Results Follow up Letter  [place] [facility]  [address] [place], [place] [address]   Phone: [phone]  Fax: [phone]    05/02/2008 MRN: [id number]  MARISHA RENIER [address] [place], [place]  [address]  Dear Ms. Wellen,  The following are the results of your recent test(s):  Test         Result    Pap Smear:        Normal _____  Not Normal _____ Comments: ______________________________________________________ Cholesterol: LDL(Bad cholesterol):         Your goal is less than:         HDL (Good cholesterol):       Your goal is more than: Comments:  ______________________________________________________ Mammogram:        Normal _X___  Not Normal _____ Comments: Repeat in 1 year ___________________________________________________________________ Hemoccult:        Normal _____  Not normal _______ Comments:    _____________________________________________________________________ Other Tests:    We routinely do not discuss normal results over the telephone.  If you desire a copy of the results, or you have any questions about this information we can discuss them at your next office visit.   Sincerely,      Roxy Manns, MD

## 2010-04-09 NOTE — Assessment & Plan Note (Signed)
Summary: Shoulder; Back Return Visit   Vital Signs:  Patient Profile:   55 Years Old Female Weight:      180 pounds Temp:     97.9 degrees F oral Pulse rate:   76 / minute Pulse rhythm:   regular BP sitting:   130 / 96  (left arm) Cuff size:   regular  Vitals Entered By: Liane Comber (December 12, 2007 11:56 AM)                 Chief Complaint:  shoulder and back pain.  History of Present Illness: pain is still in R shoulderblade and R shoulder too is about the same -- no better or worse with PT  vicodin cuts her pain in 1`/2 has not changed in nature - mostly sharp  only position she can sleep -- is on her back -- but pain does not go away completely then gets worse as the day goes on  worse if she uses her r arm more   now her hands are painful-- for the past 2 weeks -- now a little numb too  stiff and swollen feeling-- hurting "really bad" in joints with knots in her fingers no personal hx of inflammatory arthritis   mom had scleroderma        Current Allergies (reviewed today): ! AMITRIPTYLINE HCL ! * ? PEANUT BUTTER BIAXIN LIPITOR SUDAFED * CYMBALTA LEVAQUIN  Past Medical History:    Reviewed history from 11/22/2007 and no changes required:       Dizziness or vertigo- chronic        GERD       Seizure disorder       Urinary incontinence       smoker       MS       plantar fasciitis s/p sx       allergic rhinitis       back pain               cardilogy--Dr [facility]       neurology-- Dr Sandria Manly   Past Surgical History:    Reviewed history from 11/22/2007 and no changes required:       Tubal ligation       Colposcopy- CIN 1, HPV typing neg (07/1999)       Cervical spine surgery (11/2002)       GYN surgery       Abd [place]- gallstones (02/2006)       Cholecystectomy       foot sx- plantar fasciitis- summer 08 (did not work per pt)       5/09 normal stress nuclear study       9/09 nl cxr        9/09 nl TS films for L sided back  pain   Family History:    Reviewed history from 06/30/2007 and no changes required:       Father: CAD 6 bypass       Mother: migraines, CAD - died of MI       Siblings: 2 brothers, 1 sister       cousin- CAD       gm- CAD  Social History:    Reviewed history from 11/22/2007 and no changes required:       Marital Status: Married       Children: 3       Occupation: disabled- from MS       smokes 1/2 to 1ppd  Review of Systems  General      Complains of fatigue.      Denies loss of appetite and malaise.  Eyes      Denies blurring.  CV      Denies chest pain or discomfort, palpitations, and shortness of breath with exertion.  Resp      Denies cough and wheezing.  GI      Denies abdominal pain.  MS      Complains of joint pain, joint swelling, mid back pain, and stiffness.      Denies joint redness and cramps.  Derm      Denies itching, lesion(s), and rash.  Neuro      Complains of numbness, tingling, and weakness.      Denies tremors.      from her MS as well  Psych      mood is fair    Physical Exam  General:     fatigued but generally well appearing  Head:     normocephalic, atraumatic, and no abnormalities observed.   Eyes:     vision grossly intact, pupils equal, pupils round, and pupils reactive to light.  eyes are not overly dry Mouth:     pharynx pink and moist.   Neck:     some tenderness lower CS with fairly good rom  Chest Wall:     No deformities, masses, or tenderness noted. Lungs:     diffusely distant bs with good air exch no rales/rhonchi or wheeze Heart:     Normal rate and regular rhythm. S1 and S2 normal without gallop, murmur, click, rub or other extra sounds. Abdomen:     soft and non-tender.   Msk:     tender over TS and medial to R scapula mild lower CS tenderness with full rom  tender over R acromion , with pos hawking's sign and scratch test  is able to abduct shoulder over 90 deg- however with minimal pain neg  tinel's sign - bilat wrist pos phalen sign for thumb pain and tingling on R grip R limited by pain  tender middle pip joints in hands - without obvious defomity Pulses:     plus one pedal pulses  Extremities:     no edema  Neurologic:     dec sens to soft touch in all fingers of R hand  Skin:     Intact without suspicious lesions or rashes-- ruddy complexion Cervical Nodes:     No lymphadenopathy noted Psych:     somewhat depressed affect     Impression & Recommendations:  Problem # 1:  BACK PAIN (ICD-724.5) Assessment: Unchanged ongoing pain around R scapula persists  nl TS and cxr , and no imp with PT (tens, deep heat, other modalities and rom) no help with naproxen -- adv to stop it  refilled vicodin-- with warning of sedation and poss habit forming potential labs today for general arthropathy-- pend result and consider orthopedic referral  adv to stop PT f/u- as is is not helping -- paper signed for [place]  The following medications were removed from the medication list:    Naproxen 500 Mg Tabs (Naproxen) .Marland Kitchen... 1 by mouth two times a day with food  Her updated medication list for this problem includes:    Flexeril 10 Mg Tabs (Cyclobenzaprine hcl) .Marland Kitchen... Take one by mouth daily    Vicodin 5-500 Mg Tabs (Hydrocodone-acetaminophen) .Marland Kitchen... 1 by mouth q 6 hours as needed severe pain   Problem #  2:  ARTHRALGIA (ICD-719.40) Assessment: New worsening in the past 2 weeks with hand pain and swelling (also some R hand numbness -- neg tinel but pos phalen test ) also ongoing L foot pain and gen arthropathy all over body fam hx of scleroderma diff incl OA or inflam arthritis, carpal tunnel check labs today and advise further  Orders: Venipuncture ([address]) TLB-CBC Platelet - w/Differential (85025-CBCD) TLB-Rheumatoid Factor (RA) (86430-RA) TLB-Sedimentation Rate (ESR) (85651-ESR) T-Antinuclear Antib (ANA) ([id number])   Problem # 3:  Preventive Health Care  (ICD-V70.0) Assessment: Comment Only do advise flu shot if it is safe to take with her beta seron-- she will check with her neurologist office   Problem # 4:  MULTIPLE SCLEROSIS (ICD-340) Assessment: Unchanged has been fairly stable in terms of weakness, but neuropathy continues f/u per neurology- on beta seron  Problem # 5:  TOBACCO USE (ICD-305.1) Assessment: Unchanged again advised to quit smoking due to multiple health risks  pt states she is not ready yet   Complete Medication List: 1)  Neurontin 600 Mg Tabs (Gabapentin) .... Take one by mouth tid 2)  Crestor 20 Mg Tabs (Rosuvastatin calcium) .... Take one half by mouth daily 3)  Prozac 10 Mg Caps (Fluoxetine hcl) .... Take one by mouth daily 4)  Flexeril 10 Mg Tabs (Cyclobenzaprine hcl) .... Take one by mouth daily 5)  Betaseron 0.3 Mg Solr (Interferon beta-1b) .... Injections as directed 6)  Clarinex 5 Mg Tabs (Desloratadine) .Marland Kitchen.. 1 by mouth once daily as needed 7)  Omeprazole 20 Mg Cpdr (Omeprazole) .Marland Kitchen.. 1 by mouth once daily in am 30 min before breakfast 8)  Flonase 50 Mcg/act Susp (Fluticasone propionate) .... 2 sprays in each nostril daily 9)  Trazodone Hcl 50 Mg Tabs (Trazodone hcl) .... One by mouth hs as needed 10)  Rozerem 8 Mg Tabs (Ramelteon) .... One by mouth hs as needed 11)  Nystatin 100000 Unit/gm Crea (Nystatin) .... Apply once daily to affected area 12)  Vicodin 5-500 Mg Tabs (Hydrocodone-acetaminophen) .Marland Kitchen.. 1 by mouth q 6 hours as needed severe pain   Patient Instructions: 1)  call and ask your neurology office if it safe to take a flu shot with your betaseron  2)  if so-- call us to schedule flu shot  3)  we are doing some blood work for arthritis today-- and make a plan based on result 4)  you can cancel any more PT appointments    Prescriptions: VICODIN 5-500 MG TABS (HYDROCODONE-ACETAMINOPHEN) 1 by mouth q 6 hours as needed severe pain  #30 x 0   Entered and Authorized by:   Judith Part MD    Signed by:   Judith Part MD on 12/12/2007   Method used:   Print then Give to Patient   RxID:   [id number]  ]

## 2010-04-09 NOTE — Assessment & Plan Note (Signed)
Summary: 1-2 month follow up anxiety/rbh   Vital Signs:  Patient profile:   55 year old female Height:      61.5 inches Weight:      180.75 pounds BMI:     33.72 Temp:     98 degrees F oral Pulse rate:   76 / minute Pulse rhythm:   regular BP sitting:   110 / 70  (left arm) Cuff size:   large  Vitals Entered By: Lewanda Rife LPN (September 01, 2009 3:33 PM) CC: follow-up visit   History of Present Illness: here for f/u of anxiety with stress rxn   wt is down 5 lb  went back on prozac and then inc to 20 mg at last visit  no improved in any way so far  still can't sleep and no appetitie   tried sister's xanax and that helped a bit   feels like knots all inside  stress is about the same - dealing with father's alz  is a big worrier   in past cymbalta made her like a zombie  amytriptyline - made her itch   had Ms attack last week when she got too hot  some hand pain/ numbness and fatigue / cramps         Allergies: 1)  ! Amitriptyline Hcl 2)  ! Lyrica 3)  Biaxin 4)  Lipitor 5)  Sudafed 6)  * Cymbalta 7)  Levaquin  Past History:  Past Medical History: Last updated: 07/17/2009 Dizziness or vertigo- chronic  GERD Seizure disorder Urinary incontinence MS plantar fasciitis s/p sx allergic rhinitis ADENOMATOUS COLON POLYPS 08/2008 GASTRITIS /H.PYLORI +08/2008 lung nodule  menopausal disorder anxiety  cardilogy--Dr [facility] neurology-- Dr Sandria Manly   Past Surgical History: Last updated: 07/22/2009 ANTERIOR CERVICAL DISKECTOMY and fusion, c4-5, c5-6, c6-7. Dr. Donalee Citrin. 2004 TUBAL LIGATION, HX OF (ICD-V26.51) DILATION AND CURETTAGE, HX OF (ICD-V45.89) TONSILLECTOMY, HX OF (ICD-V45.79) FOOT SURGERY, HX OF (ICD-V15.89) CHOLECYSTECTOMY, HX OF (ICD-V45.79) 9/10 chest CT with small 4mm nodule L lung base (rec re check in 1 yr) 5/11 re check chest CT- lung nodule stable 12/10 sinus CT negative  6/10 colonosco- polyps /- re check 5y 6/10 EGD- erosive gastritis  , h pylori (treated )  Family History: Last updated: 07/30/2008 Father: CAD 6 bypass Mother: migraines, CAD - died of MI Siblings: 2 brothers, 1 sister cousin- CAD gm- CAD aunt- colon cancer 36s MGF- colon cancer 40s  Social History: Last updated: 07/30/2008 Marital Status: Married Children: 3 Occupation: disabled- from [place] smokes 1/2 to 1ppd  Alcohol Use - no Daily Caffeine Use-2  Risk Factors: Smoking Status: quit (09/30/2006)  Review of Systems General:  Complains of fatigue and sleep disorder; denies loss of appetite, malaise, and sweats. Eyes:  Denies blurring and eye pain. CV:  Denies chest pain or discomfort, palpitations, and shortness of breath with exertion. Resp:  Denies cough, shortness of breath, and wheezing. GI:  Denies abdominal pain, change in bowel habits, and nausea. GU:  Denies dysuria and urinary frequency. MS:  Complains of cramps; denies muscle weakness. Derm:  Denies lesion(s), poor wound healing, and rash. Neuro:  Complains of tingling; denies headaches. Psych:  Complains of anxiety, easily tearful, and irritability. Endo:  Denies cold intolerance, excessive thirst, and heat intolerance. Heme:  Denies abnormal bruising and bleeding.  Physical Exam  General:  overweight but generally well appearing - seems mildly fatigued  Head:  normocephalic, atraumatic, and no abnormalities observed.   Eyes:  vision grossly intact, pupils equal,  pupils round, and pupils reactive to light.   Mouth:  pharynx pink and moist.   Neck:  supple with full rom and no masses or thyromegally, no JVD or carotid bruit  Lungs:  CTA with diffusely distant bs no wheeze today no rales or rhonchi Heart:  Normal rate and regular rhythm. S1 and S2 normal without gallop, murmur, click, rub or other extra sounds. Extremities:  trace left pedal edema and trace right pedal edema.   Neurologic:  sensation intact to light touch, gait normal, and DTRs symmetrical and normal.  no  tremor  Skin:  Intact without suspicious lesions or rashes Cervical Nodes:  No lymphadenopathy noted Psych:  depressed affect with slowed speech and fatigue eye contact fairly good  not tearful  no SI   Impression & Recommendations:  Problem # 1:  ANXIETY, SITUATIONAL (ICD-308.3) not improved with prozac - esp in light of sleep problems and worry  will try zoloft to see if more effective - at 50 mg daily also ref to counseling - after discussion of situational stress in detail -- coping techniques are in need of fine tuning   Orders: Psychology Referral (Psychology) Prescription Created Electronically [phone])  Complete Medication List: 1)  Crestor 20 Mg Tabs (Rosuvastatin calcium) .... Take one half by mouth daily 2)  Flexeril 10 Mg Tabs (Cyclobenzaprine hcl) .... Take one by mouth in am and 2 tabs in pm 3)  Clarinex 5 Mg Tabs (Desloratadine) .Marland Kitchen.. 1 by mouth once daily as needed 4)  Omeprazole 20 Mg Cpdr (Omeprazole) .Marland Kitchen.. 1 by mouth two times a day 5)  Flonase 50 Mcg/act Susp (Fluticasone propionate) .... 2 sprays in each nostril daily as needed 6)  Nystatin 100000 Unit/gm Crea (Nystatin) .... Apply once daily to affected area as needed 7)  Proventil Hfa 108 (90 Base) Mcg/act Aers (Albuterol sulfate) .... 2 puffs up to every 4 hours as needed wheezing 8)  Gabapentin 600 Mg Tabs (Gabapentin) .... Take one by mouth every 2 hours, total of 6 daily 9)  Beta Seron Shots  .... For ms - dr love 10)  Robinul-forte 2 Mg Tabs (Glycopyrrolate) .Marland Kitchen.. 1 tablet twice a day 11)  Align Caps (Probiotic product) .... Take 1 tab samples given 12)  Analpram E 2.5-1 & 1 % Kit (Hydrocortisone ace-pramoxine) .... Apply two times a day to rectum as needed 13)  Anusol-hc 25 Mg Supp (Hydrocortisone acetate) .... Use rectally two times a day as needed 14)  Robitussin Chest Congestion 100 Mg/37ml Syrp (Guaifenesin) .... Otc as directed. 15)  Guaifenesin-codeine 100-10 Mg/58ml Syrp (Guaifenesin-codeine) .Marland Kitchen.. 1-2  teaspoon by mouth up to every 6 hours as needed cough caution of sedation 16)  Augmentin 875-125 Mg Tabs (Amoxicillin-pot clavulanate) .Marland Kitchen.. 1 by mouth two times a day for 10 days for sinus infection 17)  Zoloft 50 Mg Tabs (Sertraline hcl) .Marland Kitchen.. 1 by mouth once daily in pm 18)  Alprazolam 0.5 Mg Tabs (Alprazolam) .Marland Kitchen.. 1 by mouth up to two times a day as needed severe anxiety - only use if necessary  Patient Instructions: 1)  stop prozac  2)  start the zoloft 50 mg each evening  3)  use the xanax only when absolutely necessary  4)  we will do counseling referral at check out  5)  follow up with me in 6 weeks  Prescriptions: ALPRAZOLAM 0.5 MG TABS (ALPRAZOLAM) 1 by mouth up to two times a day as needed severe anxiety - only use if necessary  #30 x 0  Entered and Authorized by:   Judith Part MD   Signed by:   Judith Part MD on 09/01/2009   Method used:   Print then Give to Patient   RxID:   [id number] ZOLOFT 50 MG TABS (SERTRALINE HCL) 1 by mouth once daily in pm  #30 x 11   Entered and Authorized by:   Judith Part MD   Signed by:   Judith Part MD on 09/01/2009   Method used:   [facility]        [facility]  [address] [facility]       [address]       [place], [place]  [address]       Ph: [phone] or [phone]       Fax: [phone]   RxID:   [id number]   Current Allergies (reviewed today): ! AMITRIPTYLINE HCL ! LYRICA BIAXIN LIPITOR SUDAFED * CYMBALTA LEVAQUIN

## 2010-04-09 NOTE — Consult Note (Signed)
Summary: [facility]/Consultation Report/Dr. Meyerdierks  [facility]/Consultation Report/Dr. Meyerdierks   Imported By: Mickle Asper 01/19/2008 15:51:24  _____________________________________________________________________  External Attachment:    Type:   Image     Comment:   External Document

## 2010-04-09 NOTE — Assessment & Plan Note (Signed)
Summary: GERD,DIARRHEA   History of Present Illness Visit Type: Initial Consult Primary GI MD: Elie Goody MD [facility] Primary Sion Thane: Roxy Manns, MD Chief Complaint: diarrhea/GERD History of Present Illness:   This is a 55 year old female who relates a 6 month history of frequent, watery, nonbloody diarrhea brought on by meals. She notes frequent lower abdominal discomfort that improves with bowel movements. She feels constantly bloated but denies any weight gain. She states when she has a normal bowel movement she has more severe lower abdominal pain and pain generally improves following a bowel movement. Her family history is remarkable for an aunt and paternal grandfather both colon cancer. She has had chronic reflux symptoms since her 65s her symptoms are currently controlled on omeprazole 20 mg daily.   GI Review of Systems    Reports abdominal pain, acid reflux, bloating, and  nausea.     Location of  Abdominal pain: lower abdomen.    Denies belching, chest pain, dysphagia with liquids, dysphagia with solids, heartburn, loss of appetite, vomiting, vomiting blood, weight loss, and  weight gain.      Reports diarrhea, light color stool, and  rectal pain.     Denies anal fissure, black tarry stools, change in bowel habit, constipation, diverticulosis, fecal incontinence, heme positive stool, hemorrhoids, irritable bowel syndrome, jaundice, liver problems, and  rectal bleeding.   Current Medications (verified): 1)  Crestor 20 Mg  Tabs (Rosuvastatin Calcium) .... Take One Half By Mouth Daily 2)  Prozac 10 Mg  Caps (Fluoxetine Hcl) .... Take One By Mouth Daily 3)  Flexeril 10 Mg  Tabs (Cyclobenzaprine Hcl) .... Take One By Mouth Daily 4)  Clarinex 5 Mg  Tabs (Desloratadine) .Marland Kitchen.. 1 By Mouth Once Daily As Needed 5)  Omeprazole 20 Mg  Cpdr (Omeprazole) .Marland Kitchen.. 1 By Mouth Once Daily in Am 30 Min Before Breakfast 6)  Flonase 50 Mcg/act  Susp (Fluticasone Propionate) .... 2 Sprays in Each  Nostril Daily 7)  Nystatin 100000 Unit/gm Crea (Nystatin) .... Apply Once Daily To Affected Area 8)  Proventil Hfa 108 (90 Base) Mcg/act Aers (Albuterol Sulfate) .... 2 Puffs Up To Every 4 Hours As Needed Wheezing 9)  Gabapentin 600 Mg Tabs (Gabapentin) .... Take One By Mouth Every 2 Hours, Total of 6 Daily 10)  Beta Seron Shots .... For Ms - Dr Sandria Manly  Allergies: 1)  ! Amitriptyline Hcl 2)  ! Lyrica 3)  Biaxin 4)  Lipitor 5)  Sudafed 6)  * Cymbalta 7)  Levaquin  Past History:  Past Medical History:    Dizziness or vertigo- chronic     GERD    Seizure disorder    Urinary incontinence    MS    plantar fasciitis s/p sx    allergic rhinitis    back pain         cardilogy--Dr [facility]    neurology-- Dr Sandria Manly   Past Surgical History:    Reviewed history from 11/22/2007 and no changes required:    Tubal ligation    Colposcopy- CIN 1, HPV typing neg (07/1999)    Cervical spine surgery (11/2002)    GYN surgery    Abd [place]- gallstones (02/2006)    Cholecystectomy    foot sx- plantar fasciitis- summer 08 (did not work per pt)    5/09 normal stress nuclear study    9/09 nl cxr     9/09 nl TS films for L sided back pain  Family History:    Reviewed history from 07/23/2008  and no changes required:       Father: CAD 6 bypass       Mother: migraines, CAD - died of MI       Siblings: 2 brothers, 1 sister       cousin- CAD       gm- CAD       aunt- colon cancer 42s       MGF- colon cancer 46s  Social History:    Marital Status: Married    Children: 3    Occupation: disabled- from [place]    smokes 1/2 to 1ppd     Alcohol Use - no    Daily Caffeine Use-2  Review of Systems       The patient complains of allergy/sinus, anxiety-new, arthritis/joint pain, back pain, depression-new, fatigue, headaches-new, sleeping problems, swelling of feet/legs, and urine leakage.         The pertinent positives and negatives are noted as above and in the HPI. All other ROS were reviewed and were  negative.   Vital Signs:  Patient profile:   55 year old female Height:      61.5 inches Weight:      182.13 pounds Pulse rate:   76 / minute Pulse rhythm:   regular BP sitting:   110 / 62  (right arm) Cuff size:   regular  Vitals Entered By: June McMurray CMA (Jul 30, 2008 8:46 AM)  Physical Exam  General:  Well developed, well nourished, no acute distress. Head:  Normocephalic and atraumatic. Eyes:  PERRLA, no icterus. Ears:  Normal auditory acuity. Mouth:  No deformity or lesions, dentition normal. Neck:  Supple; no masses or thyromegaly. Lungs:  Clear throughout to auscultation. Heart:  Regular rate and rhythm; no murmurs, rubs,  or bruits. Abdomen:  Soft, nontender and nondistended. No masses, hepatosplenomegaly or hernias noted. Normal bowel sounds. Rectal:  deferred until time of colonoscopy.   Msk:  Symmetrical with no gross deformities. Normal posture. Pulses:  Normal pulses noted. Extremities:  No clubbing, cyanosis, edema or deformities noted. Neurologic:  Alert and  oriented x4;  grossly normal neurologically. Cervical Nodes:  No significant cervical adenopathy. Inguinal Nodes:  No significant inguinal adenopathy. Psych:  Alert and cooperative. Normal mood and affect.   Impression & Recommendations:  Problem # 1:  DIARRHEA (ICD-787.91) Assessment Unchanged Diarrhea associated with meals and lower abdominal pain. Suspect irritable bowel syndrome. Rule out inflammatory bowel disease and colorectal neoplasms. The risks, benefits and alternatives to colonoscopy with possible biopsy and possible polypectomy were discussed with the patient and they consent to proceed. The procedure will be scheduled electively. Orders: Colon/Endo (Colon/Endo) [facility] Hemoccult Cards #3 (take home) (Hem cards #3)  Problem # 2:  ABDOMINAL PAIN-MULTIPLE SITES (ICD-789.09) Assessment: New As in problem #1. Consider abdominal and pelvic CT scan pending findings at  colonoscopy.  Problem # 3:  GERD (ICD-530.81) Assessment: Unchanged Continue omeprazole 20 mg q.a.m. Standard antireflux measures. Rule out Barrett's and erosive esophagitis. The risks, benefits and alternatives to endoscopy with possible biopsy and possible dilation were discussed with the patient and they consent to proceed. The procedure will be scheduled electively. Orders: Colon/Endo (Colon/Endo)  Patient Instructions: 1)  Colonoscopy brochure given.  2)  Conscious Sedation brochure given.  3)  Upper Endoscopy brochure given.  4)  Complete Hemoccult cards and return back to [place] in 2 weeks.  5)  Copy sent to : Roxy Manns, MD 6)  The medication list was reviewed and reconciled.  All changed / newly prescribed medications were explained.  A complete medication list was provided to the patient / caregiver.  Prescriptions: MOVIPREP 100 GM  SOLR (PEG-KCL-NACL-NASULF-NA ASC-C) As per prep instructions.  #1 x 0   Entered by:   Christie Nottingham CMA   Authorized by:   Meryl Dare MD [facility]   Signed by:   Christie Nottingham CMA on 07/30/2008   Method used:   [facility]        [facility]  [address] [facility]       [address]       [place], [place]  [address]       Ph: [phone] or [phone]       Fax: [phone]   RxID:   [id number]

## 2010-04-09 NOTE — Consult Note (Signed)
Summary: Dr.Howard Miller,Ortho,Note  Dr.Howard Miller,Ortho,Note   Imported By: Beau Fanny 11/23/2007 10:02:55  _____________________________________________________________________  External Attachment:    Type:   Image     Comment:   External Document

## 2010-04-09 NOTE — Consult Note (Signed)
Summary: [facility]   Imported By: Lanelle Bal 05/02/2009 09:10:59  _____________________________________________________________________  External Attachment:    Type:   Image     Comment:   External Document

## 2010-04-09 NOTE — Miscellaneous (Signed)
Summary: [facility] rx  Clinical Lists Changes  Medications: Added new medication of PYLERA 140-125-125 MG CAPS (BIS SUBCIT-METRONID-TETRACYC) 3 capsule by mouth four times a day x 4 more days, already given 6 days worth of samples - Signed Rx of PYLERA 140-125-125 MG CAPS (BIS SUBCIT-METRONID-TETRACYC) 3 capsule by mouth four times a day x 4 more days, already given 6 days worth of samples;  #48 x 0;  Signed;  Entered by: Christie Nottingham CMA;  Authorized by: Meryl Dare MD [facility];  Method used: [facility]  [address] [place], [place]  [address], Ph: [phone] or [phone], Fax: [phone]    Prescriptions: PYLERA 140-125-125 MG CAPS (BIS SUBCIT-METRONID-TETRACYC) 3 capsule by mouth four times a day x 4 more days, already given 6 days worth of samples  #48 x 0   Entered by:   Christie Nottingham CMA   Authorized by:   Meryl Dare MD [facility]   Signed by:   Christie Nottingham CMA on 08/22/2008   Method used:   [facility]ron[facility]        [facility]  [address] (retail)       [address]       [place], [place]  [address]       Ph: [phone] or [phone]       Fax: [phone]   RxID:   [id number]

## 2010-04-09 NOTE — Consult Note (Signed)
Summary: [facility]   [facility]   Imported By: Maryln Gottron 12/24/2009 15:26:26  _____________________________________________________________________  External Attachment:    Type:   Image     Comment:   External Document

## 2010-04-09 NOTE — Letter (Signed)
Summary: Patient Notice- Polyp Results  [facility]  [address] [place], [place] [address]   Phone: [phone]  Fax: [phone]        August 21, 2008 MRN: [id number]    [facility] [address] [place], [place]  [address]    Dear Ms. Kerper,  I am pleased to inform you that the colon polyp(s) removed during your recent colonoscopy was (were) found to be benign (no cancer detected) upon pathologic examination. The colonic biopsies were otherwise normal-no evidence of colitis. Continue treatment for irritable bowel syndrome.  I recommend you have a repeat colonoscopy examination in 5 years to look for recurrent polyps, as having colon polyps increases your risk for having recurrent polyps or even colon cancer in the future.  Should you develop new or worsening symptoms of abdominal pain, bowel habit changes or bleeding from the rectum or bowels, please schedule an evaluation with either your primary care physician or with me.  Continue treatment plan as outlined the day of your exam.  Please call us if you are having persistent problems or have questions about your condition that have not been fully answered at this time.  Sincerely,  Meryl Dare MD [facility]  This letter has been electronically signed by your physician.

## 2010-04-09 NOTE — Letter (Signed)
Summary: Dr.Gary Cram,Jr.,[facility]  Dr.Gary Cram,Jr.,[facility]   Imported By: Beau Fanny 04/04/2008 08:56:04  _____________________________________________________________________  External Attachment:    Type:   Image     Comment:   External Document

## 2010-04-09 NOTE — Assessment & Plan Note (Signed)
Summary: PRE-OP CLEARANCE/CLE   Vital Signs:  Patient Profile:   55 Years Old Female Weight:      184 pounds Temp:     98.2 degrees F oral Pulse rate:   60 / minute Pulse rhythm:   regular BP sitting:   102 / 72  (left arm) Cuff size:   large  Vitals Entered By: Lowella Petties (June 30, 2007 8:15 AM)                 Chief Complaint:  Surgical clearance. Cough and sinus sxs.  History of Present Illness: is planning bladder sling proceedure- with Dr Ernestina Penna now- has a lot of leaking   has a cold and sinus issue today- going on for about a week  stuffy, st , cough with phlegm - yellow wheeze a little if she lays down  no fever   still has chest pains- and gets out of breath 2-3 times per week, gets L sided chest pain and occ rad to arms  arms and legs hurt so much- can not do very much activity gets out of breath after a couple of minutes  has never had a cardiac w/u   she is stil on crestor  Dr Sandria Manly put her on amitryptiline for sleep and pain- started making her itch and swell  cut down to 1/2 ppd and plans to quit before sx is using nicotine patch- which does help      Current Allergies: ! AMITRIPTYLINE HCL ! * ? PEANUT BUTTER BIAXIN LIPITOR SUDAFED * CYMBALTA LEVAQUIN  Past Medical History:    Reviewed history from 09/30/2006 and no changes required:       Dizziness or vertigo       GERD       Seizure disorder       Urinary incontinence       smoker       MS       plantar fasciitis s/p sx  Past Surgical History:    Reviewed history from 02/07/2007 and no changes required:       Tubal ligation       Colposcopy- CIN 1, HPV typing neg (07/1999)       Cervical spine surgery (11/2002)       GYN surgery       Abd [place]- gallstones (02/2006)       Cholecystectomy       foot sx- plantar fasciitis- summer 08 (did not work per pt)   Family History:    Father: CAD 6 bypass    Mother: migraines, CAD - died of MI    Siblings: 2 brothers, 1 sister   cousin- CAD    gm- CAD  Social History:    Marital Status: Married    Children: 3    Occupation: disabled    smokes 1/2 ppd     Physical Exam  General:     overweight but generally well appearing  Head:     normocephalic, atraumatic, and no abnormalities observed.  tender ethmoid and max sinuses Eyes:     vision grossly intact, pupils equal, pupils round, pupils reactive to light, and no injection.   Ears:     R ear normal and L ear normal.   Nose:     mucosal erythema and mucosal edema.   Mouth:     pharynx pink and moist, no erythema, and no exudates.   Neck:     supple with full rom and no masses or  thyromegally, no JVD or carotid bruit  Chest Wall:     No deformities, masses, or tenderness noted. Lungs:     diffusely distant bs with good air exch no rales/rhonchi or wheeze Heart:     Normal rate and regular rhythm. S1 and S2 normal without gallop, murmur, click, rub or other extra sounds. Abdomen:     soft and non-tender.   Msk:     no cva tenderness no acute joint changes Pulses:     plus one pedal pulses Extremities:     No clubbing, cyanosis, edema, or deformity noted with normal full range of motion of all joints.   Neurologic:     cranial nerves II-XII intact, sensation intact to light touch, gait normal, and DTRs symmetrical and normal.   Skin:     Intact without suspicious lesions or rashes- ruddy complexion Cervical Nodes:     No lymphadenopathy noted Psych:     affect is somewhat depressed/blunted     Impression & Recommendations:  Problem # 1:  CHEST PAIN (ICD-786.50) Assessment: New sometimes exertional no acute changes on EKG today  with mult cardiac risk factors- fam hx, smoking, hyperlipidemia, obesity ref to cardiol for further eval check fasting lipids when able Orders: Cardiology Referral (Cardiology)   Problem # 2:  SINUSITIS- ACUTE-NOS (ICD-461.9) Assessment: Deteriorated tx with augmentin sympt care- expectorant and  saline update if no imp The following medications were removed from the medication list:    Levaquin 500 Mg Tabs (Levofloxacin) .Marland Kitchen... 1 by mouth qd  Her updated medication list for this problem includes:    Augmentin 875-125 Mg Tabs (Amoxicillin-pot clavulanate) .Marland Kitchen... 1 by mouth two times a day for 10 days   Problem # 3:  TOBACCO USE (ICD-305.1) Assessment: Unchanged needs to quit smoking before planning urol sx is trying to quit disc risks in detai Orders: Cardiology Referral (Cardiology)   Problem # 4:  URINARY INCONTINENCE (ICD-788.30) Assessment: Deteriorated pt interested in urol sx for incontinence needs cardiac clearance labs- f/u would prefer she quit smoking first will disc further at 2mo f/u after labs  Problem # 5:  Hx of HYPERCHOLESTEROLEMIA (ICD-272.0) Assessment: Unchanged check lipids and update--statin and diet currently  Her updated medication list for this problem includes:    Crestor 20 Mg Tabs (Rosuvastatin calcium) .Marland Kitchen... Take one half by mouth daily  Orders: Cardiology Referral (Cardiology)  Labs Reviewed: SGOT: 26 (10/17/2006)   SGPT: 18 (10/17/2006)   Complete Medication List: 1)  Neurontin 600 Mg Tabs (Gabapentin) .... Take one by mouth tid 2)  Crestor 20 Mg Tabs (Rosuvastatin calcium) .... Take one half by mouth daily 3)  Prozac 10 Mg Caps (Fluoxetine hcl) .... Take one by mouth daily 4)  Flexeril 10 Mg Tabs (Cyclobenzaprine hcl) .... Take one by mouth daily 5)  Betaseron 0.3 Mg Solr (Interferon beta-1b) .... Injections as directed 6)  Hydrochlorothiazide 12.5 Mg Caps (Hydrochlorothiazide) .... Take one by mouth q am 7)  Clarinex 5 Mg Tabs (Desloratadine) .Marland Kitchen.. 1 by mouth once daily as needed 8)  Augmentin 875-125 Mg Tabs (Amoxicillin-pot clavulanate) .Marland Kitchen.. 1 by mouth two times a day for 10 days   Patient Instructions: 1)  keep working on quitting smoking 2)  you can try mucinex over the counter twice daily as directed and nasal saline spray  for congestion 3)  tylenol over the counter as directed may help with aches, headache and fever 4)  call if symptoms worsen or if not improved in 4-5 days  5)  try to watch diet for cholesterol 6)  we will do cardiology for chest pain at check out 7)  schedule fasting labs when able to check lipid/hepatic function and renal panel 272, and tsh and cbc with diff 780.79 8)  take the augmentin as directed for sinus infection 9)  follow up with me 1 month     Prescriptions: AUGMENTIN 875-125 MG  TABS (AMOXICILLIN-POT CLAVULANATE) 1 by mouth two times a day for 10 days  #20 x 0   Entered and Authorized by:   Judith Part MD   Signed by:   Judith Part MD on 06/30/2007   Method used:   Print then Give to Patient   RxID:   [id number]  ]  EKG  Procedure date:  06/30/2007  Findings:      NSR with rate of 80 no acute changes- some artifact

## 2010-04-09 NOTE — Miscellaneous (Signed)
Summary: [facility]  [facility]   Imported By: Maryln Gottron 07/03/2009 15:53:49  _____________________________________________________________________  External Attachment:    Type:   Image     Comment:   External Document

## 2010-04-09 NOTE — Assessment & Plan Note (Signed)
Summary: BACK,R SHOULDER PAIN/CLE   Vital Signs:  Patient Profile:   55 Years Old Female Weight:      181.25 pounds (82.39 kg) Temp:     98.3 degrees F (36.83 degrees C) oral Pulse rate:   64 / minute Pulse rhythm:   regular BP sitting:   110 / 80  (left arm) Cuff size:   regular  Vitals Entered By: Silas Sacramento (March 22, 2008 11:51 AM)                 Chief Complaint:  Back and rt shoulder pain/Dr. T sent her for x-rays.  History of Present Illness: 55 year old female here report he wish and continued RIGHT shoulder pain, radicular pain, and some numbness and tingling on the RIGHT side.  1. Shoulder blade pain, cervical spine surgery: h/o cervical spine surgery 2004 - ?? [place] - NSG, [facility] - patient not sure of Writer. Multiple other evals, [facility], [facility], neg nerve conduction studies. has MS  Was having some hand pain in her hands and back. Saw hand MD at [facility] orth[facility], subsequently had nerve conduction studies ordered, all of which were negative. Cervical spine x-rays at [facility] - printed out versions of these x-rays are present in the office today, and she does have hardware still in her neck. There is evidence of a prior cervical fusion.  2. shoulder pain, RIGHT colon Hurts at the bone at her [facility] joint on the R. Pain in the right. no specific injury to the RIGHT shoulder but the patient can recall. PT a lot, not helped, hurt the pain hin her back. Her primary region of pain is in the lower scapular retractor areas. He has been seen by Dr. Hyacinth Meeker at [facility] in the past and had this injected with corticosteroids, which provided minimal to no relief. No spinal injection, has had some steroids into the scapula region. physical therapy and home exercises have made the patient feel worse in this region.  All this is compounded by the patient's multiple sclerosis.  Fairly debilitated. Raising arm up goes numb.         Current Allergies: ! AMITRIPTYLINE HCL ! * ? PEANUT BUTTER BIAXIN LIPITOR SUDAFED * CYMBALTA LEVAQUIN  Past Medical History:    Reviewed history from 11/22/2007 and no changes required:       Dizziness or vertigo- chronic        GERD       Seizure disorder       Urinary incontinence       smoker       MS       plantar fasciitis s/p sx       allergic rhinitis       back pain               cardilogy--Dr [facility]       neurology-- Dr Sandria Manly   Past Surgical History:    Reviewed history from 11/22/2007 and no changes required:       Tubal ligation       Colposcopy- CIN 1, HPV typing neg (07/1999)       Cervical spine surgery (11/2002)       GYN surgery       Abd [place]- gallstones (02/2006)       Cholecystectomy       foot sx- plantar fasciitis- summer 08 (did not work per pt)       5/09 normal stress nuclear study  9/09 nl cxr        9/09 nl TS films for L sided back pain   Social History:    Reviewed history from 12/12/2007 and no changes required:       Marital Status: Married       Children: 3       Occupation: disabled- from MS       smokes 1/2 to 1ppd     Review of Systems      See HPI  General      Complains of fatigue.      Denies chills, fever, and sweats.  MS      Complains of joint pain, mid back pain, muscle aches, muscle, cramps, muscle weakness, stiffness, and thoracic pain.      Denies joint redness, joint swelling, loss of strength, low back pain, and muscle .  Neuro      Complains of numbness and tingling.   Physical Exam  General:     fatigued but generally well appearing  Head:     normocephalic, atraumatic, and no abnormalities observed.   Ears:     no external deformities.   Nose:     no external deformity.   Lungs:     normal respiratory effort.   Msk:     cervical spine: The patient does have limitation on flexion, extension, lateral bending, and rotation in all her records with her cervical spine. She has lost at least and  10-15 of motion in all directions. Spurling test is negative.  LEFT shoulder: Full range of motion, all provocative testing is negative.  RIGHT shoulder: Tenderness to palpation directly at the a.c. joint. Positive a.c. crossover compression test. Moderately tenderness is present at the supraspinatus insertion with isolation. Mildly tender at bicipital groove. Negative speed's, negative Yergason's test. Mildly positive Hawkins test and Neer test. Negative sulcus test. Very tender in the lower scapular stabilizer region with deep palpation. Pulses:     radial pulses intact Neurologic:     alert & oriented X3.    sensation to light touch throughout the upper extremities is intact without any evidence of focal sensory deficit.  Strength bilaterally is appropriate, slightly diminished in the LEFT compared to the RIGHT, which the patient says is her baseline. However, this is 4+/5 in the biceps and triceps region. Grossly overall the patient is appropriate in strength. Skin:     Intact without suspicious lesions or rashes Psych:     memory intact for recent and remote, normally interactive, good eye contact, not anxious appearing, and not depressed appearing.      Impression & Recommendations:  Problem # 1:  ARM NUMBNESS (ICD-782.0) Assessment: Deteriorated status post cervical fusion in 2004, no recent neurosurgical evaluation and no followup with her spine surgeon. His numbness and tingling has become quite debilitating for the patient. Her parascapular pain may be associated with thoracic nerve involvement as well.  We'll obtain an MRI of the cervical spine and the thoracic spine for evaluation of nerve impingement or cord edema, given the patient's ongoing symptoms.  All this is made more difficult given the patient's multiple sclerosis.  I am going to go for the patient to neurosurgery for evaluation, and hopefully they will be able to definitively answer whether or not this is  coming from her cervical spine, thoracic spine or not.  I'm going to increase her Neurontin 900 mg p.o. t.i.d. as well.  Orders: Radiology Referral (Radiology) Neurosurgeon Referral (Neurosurgeon)   Problem #  2:  NECK PAIN (ICD-723.1) Assessment: Deteriorated  Her updated medication list for this problem includes:    Flexeril 10 Mg Tabs (Cyclobenzaprine hcl) .Marland Kitchen... Take one by mouth daily    Vicodin 5-500 Mg Tabs (Hydrocodone-acetaminophen) .Marland Kitchen... 1 by mouth q 6 hours as needed severe pain  Orders: Radiology Referral (Radiology) Neurosurgeon Referral (Neurosurgeon)   Problem # 3:  ROTATOR CUFF SYNDROME, RIGHT (ICD-726.10) Assessment: New clearly has rotator cuff pathology, however this is independent of her other radicular symptoms, numbness, and parascapular pain.  The patient is resistant to formal physical therapy. She has not had good success with home exercise program in the past. I did discuss that ultimately with rotator cuff pathology alone, a dedicated program of rotator cuff strengthening and scapular stabilization tends to be the greatest benefit long-term.  I do think it is reasonable and may help her rotator cuff as well as her acromioclavicular pain to do a subacromial Kenalog injection.  Right SubAC Injection Verbal consent was obtained from the patient. Risks, benefits, and alternatives were explained. Patient prepped with Betadine and Ethyl Chloride used for anesthesia. The subacromial space was injected using the posterior approach. The patient tolerated the procedure well and had decreased pain post injection. No complications. Injection: 9 cc of Marcaine 0.5% and 1cc of Kenalog 40 mg. 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  Her updated medication list for this problem includes:    Vicodin 5-500 Mg Tabs  (Hydrocodone-acetaminophen) .Marland Kitchen... 1 by mouth q 6 hours as needed severe pain  Orders: Joint Aspirate / Injection, Large (16109) Kenalog 10 mg inj (J3301)   Complete Medication List: 1)  Gabapentin 300 Mg Caps (Gabapentin) .... 3 caps by mouth three times a day 2)  Crestor 20 Mg Tabs (Rosuvastatin calcium) .... Take one half by mouth daily 3)  Prozac 10 Mg Caps (Fluoxetine hcl) .... Take one by mouth daily 4)  Flexeril 10 Mg Tabs (Cyclobenzaprine hcl) .... Take one by mouth daily 5)  Clarinex 5 Mg Tabs (Desloratadine) .Marland Kitchen.. 1 by mouth once daily as needed 6)  Omeprazole 20 Mg Cpdr (Omeprazole) .Marland Kitchen.. 1 by mouth once daily in am 30 min before breakfast 7)  Flonase 50 Mcg/act Susp (Fluticasone propionate) .... 2 sprays in each nostril daily 8)  Nystatin 100000 Unit/gm Crea (Nystatin) .... Apply once daily to affected area 9)  Vicodin 5-500 Mg Tabs (Hydrocodone-acetaminophen) .Marland Kitchen.. 1 by mouth q 6 hours as needed severe pain   Patient Instructions: 1)  Referral Appointment Information 2)  Day/Date: 3)  Time: 4)  Place/MD: 5)  Address: 6)  Phone/Fax: 7)  Patient given appointment information. Information/Orders faxed/mailed.     Prescriptions: GABAPENTIN 300 MG CAPS (GABAPENTIN) 3 caps by mouth three times a day  #270 x 5   Entered and Authorized by:   Hannah Beat MD   Signed by:   Hannah Beat MD on 03/22/2008   Method used:   Print then Give to Patient   RxID:   [id number]

## 2010-04-09 NOTE — Progress Notes (Signed)
Summary: TRIAGE/rectal bleeding/cramping   Phone Note Call from Patient Call back at Home Phone [phone]   Caller: Patient Call For: STARK Reason for Call: Talk to Nurse Summary of Call: blood with BM's  Initial call taken by: Guadlupe Spanish [facility],  October 07, 2008 10:05 AM  Follow-up for Phone Call        Patient with bright red rectal bleeding since colon in June.  Patient  states this am had a large amount of bright red bleeding and has some lower abdominal cramping and discomfort.  Patient  will come in and see Amy Esterwood PA on 10-08-08 2:00 Follow-up by: Darcey Nora RN, CGRN,  October 07, 2008 10:34 AM

## 2010-04-09 NOTE — Assessment & Plan Note (Signed)
Summary: BACK PAIN,DIZZY/CLE   Vital Signs:  Patient Profile:   55 Years Old Female Weight:      174 pounds Temp:     98.1 degrees F oral Pulse rate:   88 / minute Pulse rhythm:   regular BP sitting:   120 / 76  (left arm) Cuff size:   regular  Vitals Entered By: Liane Comber (November 22, 2007 12:14 PM)                 Chief Complaint:  back pain and dizzy.  History of Present Illness: back pain is really bad-- just under R shoulder blade  has been going on for a long time -- well over a year  started getting worse over 3 mo ago has tried nsad tens unit-- last used on monday  hot and cold pads  nothing gets rid of pain -- a deep pain -- kind of sharp or burning -- and no rash   has not had any x rays latey  once had a shot -- ? what doctor did it -- right in that spot   hands hurt also  numbness and tingling is worse-- also in legs and feet  had follow up with Dr Sandria Manly on monday [place] did her exam ) is falling more often --? MS is getting worse   given trazodone and rozerem to help her sleep because amitryptiline and ambien did not help   no particular new trauma or activity-- moved but did not lift   broke her toe-- - part of a table dropped on it few months ago   itchy rash on her bottom -- desitin did not help -- 2 mo   has cut back on smoking about a ppd  no appetite-- has lost some wt   still has dizzy and nausea -- ? what causes it -- Dr Sandria Manly does not know        Current Allergies (reviewed today): ! AMITRIPTYLINE HCL ! * ? PEANUT BUTTER BIAXIN LIPITOR SUDAFED * CYMBALTA LEVAQUIN  Past Medical History:    Reviewed history from 08/01/2007 and no changes required:       Dizziness or vertigo- chronic        GERD       Seizure disorder       Urinary incontinence       smoker       MS       plantar fasciitis s/p sx       allergic rhinitis              cardilogy--Dr [facility]       neurology-- Dr Sandria Manly   Past Surgical  History:    Reviewed history from 07/27/2007 and no changes required:       Tubal ligation       Colposcopy- CIN 1, HPV typing neg (07/1999)       Cervical spine surgery (11/2002)       GYN surgery       Abd [place]- gallstones (02/2006)       Cholecystectomy       foot sx- plantar fasciitis- summer 08 (did not work per pt)       5/09 normal stress nuclear study   Family History:    Reviewed history from 06/30/2007 and no changes required:       Father: CAD 6 bypass       Mother: migraines, CAD - died of MI  Siblings: 2 brothers, 1 sister       cousin- CAD       gm- CAD  Social History:    Reviewed history from 06/30/2007 and no changes required:       Marital Status: Married       Children: 3       Occupation: disabled       smokes 1/2 to 1ppd     Review of Systems  General      Complains of fatigue, loss of appetite, and weakness.      Denies fever and malaise.  Eyes      Denies blurring and eye pain.  CV      Denies chest pain or discomfort, palpitations, shortness of breath with exertion, and swelling of feet.  Resp      Denies cough, shortness of breath, and wheezing.  GI      Denies abdominal pain, bloody stools, and change in bowel habits.  GU      Denies dysuria.  MS      Complains of joint pain and thoracic pain.      Denies joint redness and joint swelling.  Derm      Denies itching, lesion(s), and rash.  Neuro      Complains of falling down, numbness, tingling, and weakness.      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   Physical Exam  General:     overweight but generally well appearing  Head:     normocephalic, atraumatic, and no abnormalities observed.   Eyes:     vision grossly intact, pupils equal, pupils round, and pupils reactive to light.  mild bilat conj injection  Ears:     TMs clear bilat  Mouth:     pharynx pink  and moist.   Neck:     supple with full rom and no masses or thyromegally, no JVD or carotid bruit  full flex and rot bilat reproduces her thoracic area pain (no bony tenderness ) Chest Wall:     No deformities, masses, or tenderness noted. Lungs:     diffusely distant bs with good air exch no rales/rhonchi or wheeze Heart:     Normal rate and regular rhythm. S1 and S2 normal without gallop, murmur, click, rub or other extra sounds. Abdomen:     Bowel sounds positive,abdomen soft and non-tender without masses, organomegaly or hernias noted.-- no murphy's sign Msk:     tender over R parathoracic muculature- some worsening with twist and head movement  no spinal tenderness no cva tenderness  no kyphpsis or scoliosis  Pulses:     R and L carotid,radial,femoral,dorsalis pedis and posterior tibial pulses are full and equal bilaterally Extremities:     trace left pedal edema and trace right pedal edema.   Neurologic:     strength normal in all extremities, gait normal, and DTRs symmetrical and normal.  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 shot in past  will try naproxen two times a day to calm inflammation check both TS and cxr- and make plan  adv to use heat and tens if helpful Her updated medication list for this problem includes:    Flexeril 10 Mg Tabs (Cyclobenzaprine hcl) .Marland Kitchen... Take one by mouth daily    Naproxen 500 Mg Tabs (Naproxen) .Marland Kitchen... 1 by mouth two times a day with  food  Orders: Radiology Referral (Radiology)   Problem # 2:  TOBACCO USE (ICD-305.1) Assessment: Unchanged is cutting down-- per pt prep to quit since she moved to  a new house  discussed in detail risks of smoking, and possible outcomes including COPD, vascular dz, cancer and also respiratory infections/sinus problems   enc her to continue cutting down with goal of quitting  Orders: Radiology Referral (Radiology)   Problem # 3:  DIZZINESS OR VERTIGO (ICD-780.4) chronic and ? if related to MS  will f/u with neurology- Dr Sandria Manly for ongoing eval and tx for this  if falls continue due to MS-- will contact Dr Sandria Manly about recommendation for cane or walker  Her updated medication list for this problem includes:    Clarinex 5 Mg Tabs (Desloratadine) .Marland Kitchen... 1 by mouth once daily as needed   Problem # 4:  MULTIPLE SCLEROSIS (ICD-340) Assessment: Deteriorated see above- some worsening weakness and falls continue neurol follow up   Problem # 5:  INTERTRIGO (ICD-695.89) in inguinal skin folds  adv to keep cool and dry and avoid shaving tx with nystatin and update   Complete Medication List: 1)  Neurontin 600 Mg Tabs (Gabapentin) .... Take one by mouth tid 2)  Crestor 20 Mg Tabs (Rosuvastatin calcium) .... Take one half by mouth daily 3)  Prozac 10 Mg Caps (Fluoxetine hcl) .... Take one by mouth daily 4)  Flexeril 10 Mg Tabs (Cyclobenzaprine hcl) .... Take one by mouth daily 5)  Betaseron 0.3 Mg Solr (Interferon beta-1b) .... Injections as directed 6)  Clarinex 5 Mg Tabs (Desloratadine) .Marland Kitchen.. 1 by mouth once daily as needed 7)  Omeprazole 20 Mg Cpdr (Omeprazole) .Marland Kitchen.. 1 by mouth once daily in am 30 min before breakfast 8)  Flonase 50 Mcg/act Susp (Fluticasone propionate) .... 2 sprays in each nostril daily 9)  Trazodone Hcl 50 Mg Tabs (Trazodone hcl) .... One by mouth hs as needed 10)  Rozerem 8 Mg Tabs (Ramelteon) .... One by mouth hs as needed 11)  Nystatin 100000 Unit/gm Crea (Nystatin)  .... Apply once daily to affected area 12)  Naproxen 500 Mg Tabs (Naproxen) .Marland Kitchen.. 1 by mouth two times a day with food   Patient Instructions: 1)  we will send you for TS xrays and chest x ray at check out  2)  start the naproxen 500 mg - twice daily with food and stop it if it bothers your stomach  3)  use nystatin cream - on rash area- let me know if not improving in next 1-2 weeks  4)  we will make a plan when your x rays return    Prescriptions: NAPROXEN 500 MG TABS (NAPROXEN) 1 by mouth two times a day with food  #60 x 1   Entered and Authorized by:   Judith Part MD   Signed by:   Judith Part MD on 11/22/2007  Method used:   Print then Give to Patient   RxID:   [id number] NYSTATIN 100000 UNIT/GM CREA (NYSTATIN) apply once daily to affected area  #1 med x 1   Entered and Authorized by:   Judith Part MD   Signed by:   Judith Part MD on 11/22/2007   Method used:   Print then Give to Patient   RxID:   [id number]  ]

## 2010-04-09 NOTE — Assessment & Plan Note (Signed)
Summary: DISCUSS CT RESULTS/CLE   Vital Signs:  Patient profile:   55 year old female Height:      61.5 inches Weight:      181 pounds BMI:     33.77 Temp:     97.9 degrees F oral Pulse rate:   88 / minute Pulse rhythm:   regular BP sitting:   130 / 90  (left arm) Cuff size:   regular  Vitals Entered By: Liane Comber CMA (October 14, 2008 12:00 PM)  History of Present Illness: here for f/u discuss CT  pt has seen GI several times - for abd pain/ibs/gastritis/ blood in stool EGD found erosive gastritis and h pylori- this is being tx colon- found 2 polyps- 5 y f/u was recommended  ws given robinal forte and align  is getting a little better   CT of abd and pelvis done saw gastric wall thickening saw 2 mm tin lung nodule L lower lung -- rad rec re check in 1y if high risk uterine fibroid on pelvic CT  is having a lot of congestion in lungs -- coughing  3 weeks coughing up a lot of phlegm- and heavy sensation to breathe some wheezing  no fever or chills   is really trying to quit smoking "it is impossible"-- smokes 2 ppd  with stress- cannot quit  cannot afford chantix patches did not help-- did smoke with them on  has never been on wellbutrin/zyban   stress- father in hospital  has hydrocephalus - after a stroke  dementia is really bad  is taking care of him- he lives close by  no time to care of herself   some swelling in ankles at end of the day- better by am    Allergies: 1)  ! Amitriptyline Hcl 2)  ! Lyrica 3)  Biaxin 4)  Lipitor 5)  Sudafed 6)  * Cymbalta 7)  Levaquin  Past History:  Past Medical History: Last updated: 10/08/2008 Dizziness or vertigo- chronic  GERD Seizure disorder Urinary incontinence MS plantar fasciitis s/p sx allergic rhinitis ADENOMATOUS COLON POLYPS GASTRITIS /H.PYLORI +6/10  cardilogy--Dr [facility] neurology-- Dr Sandria Manly   Family History: Last updated: 07/30/2008 Father: CAD 6 bypass Mother: migraines, CAD -  died of MI Siblings: 2 brothers, 1 sister cousin- CAD gm- CAD aunt- colon cancer 72s MGF- colon cancer 59s  Social History: Last updated: 07/30/2008 Marital Status: Married Children: 3 Occupation: disabled- from [place] smokes 1/2 to 1ppd  Alcohol Use - no Daily Caffeine Use-2  Risk Factors: Smoking Status: quit (09/30/2006)  Past Surgical History: * ANTERIOR CERVICAL DISKECTOMY, HX OF TUBAL LIGATION, HX OF (ICD-V26.51) DILATION AND CURETTAGE, HX OF (ICD-V45.89) TONSILLECTOMY, HX OF (ICD-V45.79) FOOT SURGERY, HX OF (ICD-V15.89) CHOLECYSTECTOMY, HX OF (ICD-V45.79) 6/10 colonosco- polyps /- re check 5y 6/10 EGD- erosive gastritis , h pylori (treated )  Review of Systems General:  Complains of fatigue; denies chills, fever, loss of appetite, and malaise. Eyes:  Complains of discharge and itching. ENT:  Complains of hoarseness, nasal congestion, postnasal drainage, and sinus pressure; denies earache and sore throat. CV:  Complains of swelling of feet; denies chest pain or discomfort, lightheadness, and palpitations. Resp:  Complains of cough, sputum productive, and wheezing; denies pleuritic and shortness of breath. GI:  Complains of indigestion; denies bloody stools and change in bowel habits; stomach symptoms are getting better . MS:  Complains of stiffness. Derm:  Denies itching, lesion(s), and rash. Neuro:  Denies numbness, seizures, and tingling. Psych:  depressed -  taking care of her ill father . Endo:  Denies excessive thirst and excessive urination.  Physical Exam  General:  overweight but generally well appearing - seems mildly fatigued  Head:  bilat ethmoid and maxillary sinus tenderness  normocephalic, atraumatic, and no abnormalities observed.   Eyes:  vision grossly intact, pupils equal, pupils round, and pupils reactive to light.  mild conj injectoin without d/c Ears:  R ear normal and L ear normal.   Nose:  nares congested and injected  Mouth:  pharynx pink and  moist, no erythema, and no exudates.   Neck:  supple with full rom and no masses or thyromegally, no JVD or carotid bruit  Chest Wall:  No deformities, masses, or tenderness noted. Lungs:  CTA with distant/harsh bs at bases  few rhonchi no wheeze or rales   Heart:  Normal rate and regular rhythm. S1 and S2 normal without gallop, murmur, click, rub or other extra sounds. Abdomen:  mild epigastric tenderness witnout rebound or gaurding  Msk:  No deformity or scoliosis noted of thoracic or lumbar spine.   Extremities:  trace left pedal edema and trace right pedal edema.   Neurologic:  sensation intact to light touch, gait normal, and DTRs symmetrical and normal.   Skin:  Intact without suspicious lesions or rashes Cervical Nodes:  No lymphadenopathy noted Psych:  fatigued but pleasant/talkative   Impression & Recommendations:  Problem # 1:  LUNG NODULE (ICD-518.89) Assessment New small 2 mm nodule seen incidentally on abd ct -- in L lower lung  would like to image remaining lung - as pt is high risk/smoker  will plan CT in 4-6 weeks after tx of presumed bronchitis  disc imp of smoking cessation again- pt is aware of risks  Orders: Radiology Referral (Radiology)  Problem # 2:  SINUSITIS - ACUTE-NOS (ICD-461.9) Assessment: New with facial tenderness/congestion  also prod cough- suspect bronchitis  will cover with augmentin - and update rec mucinex for congestion as needed  Her updated medication list for this problem includes:    Flonase 50 Mcg/act Susp (Fluticasone propionate) .Marland Kitchen... 2 sprays in each nostril daily    Augmentin 875-125 Mg Tabs (Amoxicillin-pot clavulanate) .Marland Kitchen... 1 by mouth two times a day for 10 days  Problem # 3:  TOBACCO USE (ICD-305.1) Assessment: Unchanged reviewed again with pt - heavy smoker and does not think she can quit  cannot afford chantix - has looked into it  no help with nicotine repl does not want to try wellbutrin/zyban at this time in light of  some family members that had bad psych side eff on it (also she has personal hx of seizures) discussed in detail risks of smoking, and possible outcomes including COPD, vascular dz, cancer and also respiratory infections/sinus problems    Problem # 4:  HYPERGLYCEMIA, BORDERLINE (ICD-790.29) Assessment: Comment Only watching sugar in diet  for upcoming labs   Problem # 5:  GASTRITIS (ICD-535.50) Assessment: Improved is doing better overall on PPI and also after tx of h pylori  Her updated medication list for this problem includes:    Omeprazole 20 Mg Cpdr (Omeprazole) .Marland Kitchen... 1 by mouth two times a day    Robinul-forte 2 Mg Tabs (Glycopyrrolate) .Marland Kitchen... 1 tablet twice a day  Complete Medication List: 1)  Crestor 20 Mg Tabs (Rosuvastatin calcium) .... Take one half by mouth daily 2)  Prozac 10 Mg Caps (Fluoxetine hcl) .... Take one by mouth daily 3)  Flexeril 10 Mg Tabs (Cyclobenzaprine hcl) .... Take  one by mouth daily 4)  Clarinex 5 Mg Tabs (Desloratadine) .Marland Kitchen.. 1 by mouth once daily as needed 5)  Omeprazole 20 Mg Cpdr (Omeprazole) .Marland Kitchen.. 1 by mouth two times a day 6)  Flonase 50 Mcg/act Susp (Fluticasone propionate) .... 2 sprays in each nostril daily 7)  Nystatin 100000 Unit/gm Crea (Nystatin) .... Apply once daily to affected area 8)  Proventil Hfa 108 (90 Base) Mcg/act Aers (Albuterol sulfate) .... 2 puffs up to every 4 hours as needed wheezing 9)  Gabapentin 600 Mg Tabs (Gabapentin) .... Take one by mouth every 2 hours, total of 6 daily 10)  Beta Seron Shots  .... For ms - dr love 11)  Robinul-forte 2 Mg Tabs (Glycopyrrolate) .Marland Kitchen.. 1 tablet twice a day 12)  Align Caps (Probiotic product) .... Take 1 tab samples given 13)  Augmentin 875-125 Mg Tabs (Amoxicillin-pot clavulanate) .Marland Kitchen.. 1 by mouth two times a day for 10 days  Patient Instructions: 1)  take the augmentin for sinus infection and bronchitis as directed 2)  work on quitting smoking best you can  3)  drink lots of fluids 4)   mucinex = over the counter can help loosen congestion  5)  elevate feet when able and avoid excess salt  6)  update me if cough and congestion worsen or do not improve in 1-2 weeks  7)  we will schedule CT of chest in 4-6 weeks  Prescriptions: AUGMENTIN 875-125 MG TABS (AMOXICILLIN-POT CLAVULANATE) 1 by mouth two times a day for 10 days  #20 x 0   Entered and Authorized by:   Judith Part MD   Signed by:   Judith Part MD on 10/14/2008   Method used:   Print then Give to Patient   RxID:   [id number]   Prior Medications (reviewed today): CRESTOR 20 MG  TABS (ROSUVASTATIN CALCIUM) take one half by mouth daily PROZAC 10 MG  CAPS (FLUOXETINE HCL) take one by mouth daily FLEXERIL 10 MG  TABS (CYCLOBENZAPRINE HCL) take one by mouth daily CLARINEX 5 MG  TABS (DESLORATADINE) 1 by mouth once daily as needed OMEPRAZOLE 20 MG  CPDR (OMEPRAZOLE) 1 by mouth two times a day FLONASE 50 MCG/ACT  SUSP (FLUTICASONE PROPIONATE) 2 sprays in each nostril daily NYSTATIN 100000 UNIT/GM CREA (NYSTATIN) apply once daily to affected area PROVENTIL HFA 108 (90 BASE) MCG/ACT AERS (ALBUTEROL SULFATE) 2 puffs up to every 4 hours as needed wheezing GABAPENTIN 600 MG TABS (GABAPENTIN) Take one by mouth every 2 hours, total of 6 daily BETA SERON SHOTS () for MS - Dr Sandria Manly ROBINUL-FORTE 2 MG  TABS (GLYCOPYRROLATE) 1 tablet twice a day ALIGN  CAPS (PROBIOTIC PRODUCT) Take 1 tab samples given AUGMENTIN 875-125 MG TABS (AMOXICILLIN-POT CLAVULANATE) 1 by mouth two times a day for 10 days Current Allergies (reviewed today): ! AMITRIPTYLINE HCL ! LYRICA BIAXIN LIPITOR SUDAFED * CYMBALTA LEVAQUIN Current Medications (including changes made in today's visit):  CRESTOR 20 MG  TABS (ROSUVASTATIN CALCIUM) take one half by mouth daily PROZAC 10 MG  CAPS (FLUOXETINE HCL) take one by mouth daily FLEXERIL 10 MG  TABS (CYCLOBENZAPRINE HCL) take one by mouth daily CLARINEX 5 MG  TABS (DESLORATADINE) 1 by mouth once  daily as needed OMEPRAZOLE 20 MG  CPDR (OMEPRAZOLE) 1 by mouth two times a day FLONASE 50 MCG/ACT  SUSP (FLUTICASONE PROPIONATE) 2 sprays in each nostril daily NYSTATIN 100000 UNIT/GM CREA (NYSTATIN) apply once daily to affected area PROVENTIL HFA 108 (90 BASE) MCG/ACT AERS (ALBUTEROL  SULFATE) 2 puffs up to every 4 hours as needed wheezing GABAPENTIN 600 MG TABS (GABAPENTIN) Take one by mouth every 2 hours, total of 6 daily * BETA SERON SHOTS for MS - Dr Sandria Manly ROBINUL-FORTE 2 MG  TABS (GLYCOPYRROLATE) 1 tablet twice a day ALIGN  CAPS (PROBIOTIC PRODUCT) Take 1 tab samples given AUGMENTIN 875-125 MG TABS (AMOXICILLIN-POT CLAVULANATE) 1 by mouth two times a day for 10 days

## 2010-04-09 NOTE — Progress Notes (Signed)
Summary: still not feeling well, needs different antibiotic  Phone Note Call from Patient Call back at Home Phone [phone]   Caller: Patient Call For: Judith Part MD Summary of Call: Patient is still not feeling well and says that the Amoxicillin did not help at all. Next surgery is scheduled for Mar 21, 2009 and needs to be feeling better with every thing cleared up before then. [facility] [place]  Initial call taken by: Melody Comas,  February 24, 2009 11:26 AM  Follow-up for Phone Call        remind her to keep appt with ENT in jan -- since I do not know what is going on with sinuses  with bronchitis- will tx with zithromax (please verify she is not allergic to this) update me if not imp or if sob or fever px written on EMR for call in  Follow-up by: Judith Part MD,  February 24, 2009 1:12 PM  Additional Follow-up for Phone Call Additional follow up Details #1::        Advised pt, [facility] [address] Additional Follow-up by: Lowella Petties CMA,  February 24, 2009 2:29 PM    New/Updated Medications: ZITHROMAX Z-PAK 250 MG TABS (AZITHROMYCIN) take by mouth as directed Prescriptions: ZITHROMAX Z-PAK 250 MG TABS (AZITHROMYCIN) take by mouth as directed  #1 pack x 0   Entered and Authorized by:   Judith Part MD   Signed by:   Judith Part MD on 02/24/2009   Method used:   Telephoned to ...       [facility]  [place] (retail)       [address]       [place], [place]  [address]       Ph: [phone] or [phone]       Fax: [phone]   RxID:   [id number]

## 2010-04-09 NOTE — Letter (Signed)
Summary: CT Letter  [place] [facility]  [address] [place], [place] [address]   Phone: [phone]  Fax: [phone]    [facility] CT [facility]  02/12/2009  Patient's Name: Karina [name] MRN: [id number]  The [place] (Patient Protection and [employer] Act, Sections [phone] and [phone], dated March 23. 2010) mandates that we provide information to Medicare/Medicaid patients on facilities other than those owned by [employer].   Section [phone] of the Act amends the in-office ancillary services exception under [place] laws by requiring a referring physician to inform patients in writing, at the time of a referral, that the patients may obtain specified imaging services such as a CT exam from an entity other than the designated imaging equipment requested from the referring physician.  The provision specifically requires the referring physician to provide the patient with a written list of suppliers who furnish such services in the area in which the patient resides.  [facility] is pleased to provide you with the Medicare required information as it relates to CT services so that you an make an informed decision. [facility] takes pride in delivering high quality services where same day and next day exams are available. When your exam is performed at the [facility] CT scanner, the results are accessible to physicians and are retained in your electronic medical chart, where your physician can review the results at any time and compare them to any previous or future images you may have.  We hope you will continue to allow [place] the privilege of using our [facility] services. Your scheduler has provided you a list of Imaging Centers within a radius of this office and your signature below indicates you have been given this information to make a wise choice.         Patient Signature:            Date:         The above signature verifies that the patient was given this  letter and a choice in their imaging location.

## 2010-04-09 NOTE — Miscellaneous (Signed)
Summary: [facility] Physical Therapy Note  [facility] Physical Therapy Note   Imported By: Beau Fanny 12/12/2007 16:01:46  _____________________________________________________________________  External Attachment:    Type:   Image     Comment:   External Document

## 2010-04-09 NOTE — Progress Notes (Signed)
Summary: Request for Pain Medication  Phone Note Call from Patient Call back at Home Phone [phone]   Caller: Patient Summary of Call: Pt called requestion pain medication for her back. She uses [facility]. Initial call taken by: Mickle Asper,  November 24, 2007 9:56 AM  Follow-up for Phone Call        I do want her to take naproxen  can give short supply of vicodin-- use with caution due to sedation and habit forming potential  px written on EMR for call in  Follow-up by: Judith Part MD,  November 24, 2007 1:45 PM  Additional Follow-up for Phone Call Additional follow up Details #1::        Advised patient.  ......................................................Marland KitchenLiane Comber November 24, 2007 2:52 PM Rx called to pharmacy. ................................................Marland KitchenMarcelle Smiling Elika Godar November 24, 2007 2:52 PM     New/Updated Medications: VICODIN 5-500 MG TABS (HYDROCODONE-ACETAMINOPHEN) 1 by mouth q 6 hours as needed severe pain   Prescriptions: VICODIN 5-500 MG TABS (HYDROCODONE-ACETAMINOPHEN) 1 by mouth q 6 hours as needed severe pain  #30 x 0   Entered and Authorized by:   Judith Part MD   Signed by:   Liane Comber on 11/24/2007   Method used:   Telephoned to ...         RxID:   [id number]

## 2010-04-09 NOTE — Progress Notes (Signed)
   Phone Note Other Incoming   Caller: Boneta Lucks Action Taken: Information Sent Initial call taken by: Marijean Niemann Stress over to Short Stay to fax [phone] [facility]  March 19, 2009 2:12 PM

## 2010-04-09 NOTE — Assessment & Plan Note (Signed)
Summary: lower abdominal cramping and rectal bleeding/sheri    History of Present Illness Visit Type: Follow-up Visit Primary GI MD: Elie Goody MD [facility] Primary Provider: [facility] Chief Complaint: abdominal pain and rectal bleeding with BM's History of Present Illness:   55 YO FEMALE KNOWN TO DR Karina [name] WHO HAS UNDERGONE RECENT EVALUATION FOR UPPER AND LOWER ABDOMINAL PAIN. EGD 08/15/08 SHOWED GASTRITIS ,H.PYLORI +. SHE WAS TREATED WITH PYLERA,AND IS ON PRILOSEC TWICE DAILY. COLONOSCOPY SHOWED  2  SAMLL POLYPS/ADENOMATOUS. SHE COMES IN TODAY STILL C/O PAIN IN SAME LOCATIONS. ALSO HAD AN EPISODE YESTERDAY WITH INCREASED CRAMPY ABDOMINAL PAIN, AND HAD A LOOSE BM MIXED WITH RED BLOOD X 1 . HASNT FELT WELL . NO FURTHER BMS OR BLOOD SINCE. SHE IS USING ROBINUL ONCE DAILY.   GI Review of Systems    Reports abdominal pain, bloating, and  loss of appetite.     Location of  Abdominal pain: generalized.    Denies acid reflux, belching, chest pain, dysphagia with liquids, dysphagia with solids, heartburn, nausea, vomiting, vomiting blood, and  weight loss.      Reports change in bowel habits and  rectal bleeding.     Denies anal fissure, black tarry stools, diarrhea, diverticulosis, fecal incontinence, heme positive stool, hemorrhoids, irritable bowel syndrome, jaundice, light color stool, liver problems, and  rectal pain.    Current Medications (verified): 1)  Crestor 20 Mg  Tabs (Rosuvastatin Calcium) .... Take One Half By Mouth Daily 2)  Prozac 10 Mg  Caps (Fluoxetine Hcl) .... Take One By Mouth Daily 3)  Flexeril 10 Mg  Tabs (Cyclobenzaprine Hcl) .... Take One By Mouth Daily 4)  Clarinex 5 Mg  Tabs (Desloratadine) .Marland Kitchen.. 1 By Mouth Once Daily As Needed 5)  Omeprazole 20 Mg  Cpdr (Omeprazole) .Marland Kitchen.. 1 By Mouth Two Times A Day 6)  Flonase Karina Mcg/act  Susp (Fluticasone Propionate) .... 2 Sprays in Each Nostril Daily 7)  Nystatin 100000 Unit/gm Crea (Nystatin) .... Apply Once Daily To Affected  Area 8)  Proventil Hfa 108 (90 Base) Mcg/act Aers (Albuterol Sulfate) .... 2 Puffs Up To Every 4 Hours As Needed Wheezing 9)  Gabapentin 600 Mg Tabs (Gabapentin) .... Take One By Mouth Every 2 Hours, Total of 6 Daily 10)  Beta Seron Shots .... For Ms - Dr Sandria Manly 11)  Robinul-Forte 2 Mg  Tabs (Glycopyrrolate) .Marland Kitchen.. 1 Tablet Twice A Day  Allergies (verified): 1)  ! Amitriptyline Hcl 2)  ! Lyrica 3)  Biaxin 4)  Lipitor 5)  Sudafed 6)  * Cymbalta 7)  Levaquin  Past History:  Past Surgical History: Last updated: 06/01/2008 * ANTERIOR CERVICAL DISKECTOMY, HX OF TUBAL LIGATION, HX OF (ICD-V26.51) DILATION AND CURETTAGE, HX OF (ICD-V45.89) TONSILLECTOMY, HX OF (ICD-V45.79) FOOT SURGERY, HX OF (ICD-V15.89) CHOLECYSTECTOMY, HX OF (ICD-V45.79)  Family History: Last updated: 07/30/2008 Father: CAD 6 bypass Mother: migraines, CAD - died of MI Siblings: 2 brothers, 1 sister cousin- CAD gm- CAD aunt- colon cancer 38s MGF- colon cancer 57s  Social History: Last updated: 07/30/2008 Marital Status: Married Children: 3 Occupation: disabled- from [place] smokes 1/2 to 1ppd  Alcohol Use - no Daily Caffeine Use-2  Past Medical History: Dizziness or vertigo- chronic  GERD Seizure disorder Urinary incontinence MS plantar fasciitis s/p sx allergic rhinitis ADENOMATOUS COLON POLYPS GASTRITIS /H.PYLORI +6/10  cardilogy--Dr [facility] neurology-- Dr Sandria Manly   Review of Systems       The patient complains of allergy/sinus, fainting, and fatigue.  The patient denies anemia, anxiety-new, arthritis/joint  pain, back pain, blood in urine, breast changes/lumps, change in vision, confusion, cough, coughing up blood, depression-new, fever, headaches-new, hearing problems, heart murmur, heart rhythm changes, itching, menstrual pain, muscle pains/cramps, night sweats, nosebleeds, pregnancy symptoms, shortness of breath, skin rash, sleeping problems, sore throat, swelling of feet/legs, swollen lymph glands,  thirst - excessive , urination - excessive , urination changes/pain, urine leakage, vision changes, and voice change.         dizziness  Vital Signs:  Patient profile:   55 year old female Height:      61.5 inches Weight:      178.38 pounds BMI:     33.28 BSA:     1.81 Pulse rate:   100 / minute Pulse rhythm:   regular BP sitting:   110 / 70  (left arm)  Vitals Entered By: Merri Ray CMA (AAMA) (October 08, 2008 2:02 PM)  Physical Exam  General:  Well developed, well nourished, no acute distress. Head:  Normocephalic and atraumatic. Eyes:  PERRLA, no icterus. Lungs:  Clear throughout to auscultation. Heart:  Regular rate and rhythm; no murmurs, rubs,  or bruits. Abdomen:  SOFT, MILD TENDERNESS LEFT AND RIGHT ABDOMEN,NO GUARDING NO REBOUND BS+ Rectal:  HEME NEGATIVE ,BROWN STOOL. Neurologic:  Alert and  oriented x4;  grossly normal neurologically. Psych:  depressed affect.     Impression & Recommendations:  Problem # 1:  RECTAL BLEEDING (ICD-569.3) Assessment New  ETIOLOGY UNCLEAR, SELF-LIMITED,RECENT COLONOSCOPY6/10 NEGATIVE EXCEPT TWO SMALL POLYPS,STOOL HEME NEGATIVE TODAY. REASSURANCE,OBSERVE Orders: CT Abdomen/Pelvis with Contrast (CT Abd/Pelvis w/con)  Problem # 2:  ABDOMINAL PAIN-MULTIPLE SITES (ICD-789.09) Assessment: Unchanged PERSISTENT ABDOMINAL PAIN X 3 MONTHS,ETIOLOGY NOT CLEAR.  SCHEDULE CT ABDOMEN/PELVIS  TRIAL OF ALIGN ONE DAILY Orders: CT Abdomen/Pelvis with Contrast (CT Abd/Pelvis w/con)  Problem # 3:  GASTRITIS (ICD-535.Karina) Assessment: Improved CONTINUE OMEPRAZOLE TWICE DAILY  Problem # 4:  PERSONAL HX COLONIC POLYPS (ICD-V12.72) Assessment: Comment Only ADENOMATOUS,HAD RECENT COLON ,PLAN FOLLOW UP  2015.  Problem # 5:  MULTIPLE SCLEROSIS (ICD-340) Assessment: Comment Only  Patient Instructions: 1)  We scheduled the CT for 10-11-08 . Directions and contrast provided. 2)  Follow up with Dr Karina [name] on 11-07-08 at 10:30AM. 3)  Take 1 Align  capsule once daily x 30 days. Samples and coupon provided. 4)  Pt given information regarding Franki Cabot on location of CT scans. 5)  Copy sent to : Roxy Manns, MD 6)  The medication list was reviewed and reconciled.  All changed / newly prescribed medications were explained.  A complete medication list was provided to the patient / caregiver.

## 2010-04-09 NOTE — Miscellaneous (Signed)
Summary: allergy update   Clinical Lists Changes  Allergies: Added new allergy or adverse reaction of LEVAQUIN      Prior Medications: NEURONTIN 600 MG  TABS (GABAPENTIN) take one by mouth tid CRESTOR 20 MG  TABS (ROSUVASTATIN CALCIUM) take one half by mouth daily PROZAC 10 MG  CAPS (FLUOXETINE HCL) take one by mouth daily FLEXERIL 10 MG  TABS (CYCLOBENZAPRINE HCL) take one by mouth daily BETASERON 0.3 MG  SOLR (INTERFERON BETA-1B) injections as directed HYDROCHLOROTHIAZIDE 12.5 MG  CAPS (HYDROCHLOROTHIAZIDE) take one by mouth q am LEVAQUIN 500 MG  TABS (LEVOFLOXACIN) 1 by mouth qd Current Allergies: BIAXIN LIPITOR SUDAFED * CYMBALTA LEVAQUIN

## 2010-04-09 NOTE — Assessment & Plan Note (Signed)
Summary: 1 m f/u  dlo   Vital Signs:  Patient Profile:   55 Years Old Female Weight:      183 pounds Temp:     98.5 degrees F oral Pulse rate:   76 / minute Pulse rhythm:   regular BP sitting:   110 / 82  (left arm) Cuff size:   large  Vitals Entered By: Lowella Petties (Aug 01, 2007 8:40 AM)                 Chief Complaint:  1 month follow up.  History of Present Illness: sinus symptoms are not completely better was tx with augmentin still stuffiness- with yellow d/c is on clarinex   some trouble with early satiety with nausea- then really bloated and swollen going on since last visit  also heartburn  since here last - had stress test which was ok still some chest pains at times-- f/u is planned with cardiol in june? pain rad under her arms   chol LDL 83, HDL 41-- at goal cbc , liver , kidney, thyroid all nl   still smoking about the same -- is trying to quit- unsure if she wil be able to     Current Allergies: ! AMITRIPTYLINE HCL ! * ? PEANUT BUTTER BIAXIN LIPITOR SUDAFED * CYMBALTA LEVAQUIN  Past Medical History:    Reviewed history from 07/27/2007 and no changes required:       Dizziness or vertigo       GERD       Seizure disorder       Urinary incontinence       smoker       MS       plantar fasciitis s/p sx       allergic rhinitis              cardilogy--Dr Antoine Poche  Past Surgical History:    Reviewed history from 07/27/2007 and no changes required:       Tubal ligation       Colposcopy- CIN 1, HPV typing neg (07/1999)       Cervical spine surgery (11/2002)       GYN surgery       Abd [place]- gallstones (02/2006)       Cholecystectomy       foot sx- plantar fasciitis- summer 08 (did not work per pt)       5/09 normal stress nuclear study   Family History:    Reviewed history from 06/30/2007 and no changes required:       Father: CAD 6 bypass       Mother: migraines, CAD - died of MI       Siblings: 2 brothers, 1 sister       cousin-  CAD       gm- CAD  Social History:    Reviewed history from 06/30/2007 and no changes required:       Marital Status: Married       Children: 3       Occupation: disabled       smokes 1/2 ppd    Review of Systems  General      Complains of fatigue.      Denies chills, fever, and malaise.  Eyes      Denies discharge and eye irritation.      unless she mows   ENT      Complains of nasal congestion, postnasal drainage, and sinus pressure.  Denies sore throat.      ears feel stopped up throat is fine  CV      Complains of chest pain or discomfort and palpitations.      Denies shortness of breath with exertion.      still a little flutter to heartrate   Resp      Complains of cough and sputum productive.      still some white sputum production occ wheezing   GI      Complains of constipation, diarrhea, indigestion, and nausea.      Denies abdominal pain and vomiting.      intermittent diarrhea and constipation heartburn every time she eats-- tried some prilosec otc- helps   GU      Denies dysuria and urinary frequency.  Derm      Complains of lesion(s).      Denies rash.      occ sores on the back of her legs  Psych      Complains of irritability.      Denies depression.  Endo      Denies excessive thirst and excessive urination.   Physical Exam  General:     overweight but generally well appearing  Head:     normocephalic, atraumatic, and no abnormalities observed.  some pressure over maxillary sinuses but not pain Eyes:     vision grossly intact, pupils equal, pupils round, and pupils reactive to light.  no conjunctival pallor, injection or icterus  Ears:     R ear normal and L ear normal.   Nose:     mucosal erythema and mucosal edema.   Mouth:     pharynx pink and moist, no erythema, and no exudates.   Neck:     supple with full rom and no masses or thyromegally, no JVD or carotid bruit  Chest Wall:     No deformities, masses, or  tenderness noted. Lungs:     diffusely distant bs with good air exch no rales/rhonchi or wheeze Heart:     Normal rate and regular rhythm. S1 and S2 normal without gallop, murmur, click, rub or other extra sounds. Abdomen:     mild epigastric tenderness without rebound or gaurding  no murphy sign soft, normal bowel sounds, no distention, no masses, no hepatomegaly, and no splenomegaly.   Msk:     No deformity or scoliosis noted of thoracic or lumbar spine.  no acute joint changes  no CVA tenderness  Pulses:     R and L carotid,radial,femoral,dorsalis pedis and posterior tibial pulses are full and equal bilaterally Extremities:     trace left pedal edema and trace right pedal edema.   Neurologic:     sensation intact to light touch, gait normal, and DTRs symmetrical and normal.  no tremor  Skin:     Intact without suspicious lesions or rashes Cervical Nodes:     No lymphadenopathy noted Inguinal Nodes:     No significant adenopathy Psych:     nl affect, quiet and pleasant     Impression & Recommendations:  Problem # 1:  CHEST PAIN (ICD-786.50) Assessment: Improved ongoing with occ palpitations- overall some imp but still present pt will schedule own f/u with Dr Dennison Lions to disc stress test and symptoms  Problem # 2:  SINUSITIS- ACUTE-NOS (ICD-461.9) Assessment: Improved some imp- but still very congested ongoing allergic rhinitis- worse this season adv to work on quitting smoking and continue saline  px flonase ns 2 sp in  each nostril daily-- to see if this helps with all component in 2 weeks- if not imp- pt inst to call (consider cxr) also adv to update if fever or green nasal d/c The following medications were removed from the medication list:    Augmentin 875-125 Mg Tabs (Amoxicillin-pot clavulanate) .Marland Kitchen... 1 by mouth two times a day for 10 days  Her updated medication list for this problem includes:    Flonase 50 Mcg/act Susp (Fluticasone propionate) .Marland Kitchen... 2 sprays  in each nostril daily   Problem # 3:  TOBACCO USE (ICD-305.1) discussed in detail risks of smoking, and possible outcomes including COPD, vascular dz, cancer and also respiratory infections/sinus problems   she knows she will not heal as fast from sx as a smoker- and this also aff sinus health despite all this-- states she is not ready to quit yet  Problem # 4:  Hx of HYPERCHOLESTEROLEMIA (ICD-272.0) Assessment: Improved good control overall with crestor-- adv to continue it and good diet  Her updated medication list for this problem includes:    Crestor 20 Mg Tabs (Rosuvastatin calcium) .Marland Kitchen... Take one half by mouth daily  Labs Reviewed: Chol: 141 (07/27/2007)   HDL: 41.6 (07/27/2007)   LDL: 83 (07/27/2007)   TG: 82 (07/27/2007) SGOT: 24 (07/27/2007)   SGPT: 24 (07/27/2007)   Problem # 5:  GERD (ICD-530.81) Assessment: Deteriorated with new bloating/early satiety with increased severity and frequency of heartburn will dose prilosec daily in am instead of as needed diet discussed if not imp in 2 weeks- conside GI consult/test for H pylori note- pt has already had ccy Her updated medication list for this problem includes:    Omeprazole 20 Mg Cpdr (Omeprazole) .Marland Kitchen... 1 by mouth once daily in am 30 min before breakfast   Problem # 6:  URINARY INCONTINENCE (ICD-788.30) Assessment: Comment Only is contemplating bladder sling sx once other med probs are clinically improved-- will f/u with cardiology  Complete Medication List: 1)  Neurontin 600 Mg Tabs (Gabapentin) .... Take one by mouth tid 2)  Crestor 20 Mg Tabs (Rosuvastatin calcium) .... Take one half by mouth daily 3)  Prozac 10 Mg Caps (Fluoxetine hcl) .... Take one by mouth daily 4)  Flexeril 10 Mg Tabs (Cyclobenzaprine hcl) .... Take one by mouth daily 5)  Betaseron 0.3 Mg Solr (Interferon beta-1b) .... Injections as directed 6)  Hydrochlorothiazide 12.5 Mg Caps (Hydrochlorothiazide) .... Take one by mouth q am 7)  Clarinex 5  Mg Tabs (Desloratadine) .Marland Kitchen.. 1 by mouth once daily as needed 8)  Omeprazole 20 Mg Cpdr (Omeprazole) .Marland Kitchen.. 1 by mouth once daily in am 30 min before breakfast 9)  Flonase 50 Mcg/act Susp (Fluticasone propionate) .... 2 sprays in each nostril daily   Patient Instructions: 1)  please call Dr Saul Fordyce office and set up follow up appt when he returns in June- to further discuss chest pain and palpitations 2)  start flonase as directed for sinuses--let me know if no imp in 2 weeks 3)  if not improved- I may send you for a chest x ray 4)  start omeprazole (prilosec)- daily in am 5)  if not improved with stomach symptoms in 2 weeks- update me  6)  labs/cholesterol are ok today   Prescriptions: FLONASE 50 MCG/ACT  SUSP (FLUTICASONE PROPIONATE) 2 sprays in each nostril daily  #1 mdi x 11   Entered and Authorized by:   Judith Part MD   Signed by:   Judith Part MD on 08/01/2007  Method used:   Print then Give to Patient   RxID:   [id number] OMEPRAZOLE 20 MG  CPDR (OMEPRAZOLE) 1 by mouth once daily in am 30 min before breakfast  #30 x 11   Entered and Authorized by:   Judith Part MD   Signed by:   Judith Part MD on 08/01/2007   Method used:   Print then Give to Patient   RxID:   [id number]  ]

## 2010-04-09 NOTE — Miscellaneous (Signed)
Summary: Mammogram   Clinical Lists Changes  Observations: Added new observation of MAMMOGRAM: normal (05/02/2008 9:46)      Preventive Care Screening  Mammogram:    Date:  05/02/2008    Results:  normal

## 2010-04-09 NOTE — Assessment & Plan Note (Signed)
Summary: problems with throat/burning/rbh   Vital Signs:  Patient profile:   55 year old female Weight:      172 pounds BMI:     32.09 Temp:     98.5 degrees F oral Pulse rate:   84 / minute Pulse rhythm:   regular BP sitting:   110 / 80  (left arm) Cuff size:   large  Vitals Entered By: Sydell Axon LPN (November 26, 2009 11:56 AM) CC: Problem with throat burning, difficulty swallowing, throat feels like it is closing up, Back Pain   History of Present Illness: off and on for a long time but worse lately feels full on sides of throat and neck  throat is burning (does not feel like gerd and no heartburn) when she lies down- feels like she is choking on own saliva  some post nasal drip but front of mouth and throat feel dry  sometimes has trouble swallowing -- saliva- not food (hard to initiating a swallow)  no fever  keeps a cough - no prod   smokes 1 ppd and working on that   aunt has a brain tumor -- from her lung - this worries her   also upper gums hurt for 4 years since having teeth pulled     Allergies: 1)  ! Amitriptyline Hcl 2)  ! Lyrica 3)  Biaxin 4)  Lipitor 5)  Sudafed 6)  * Cymbalta 7)  Levaquin  Past History:  Past Medical History: Last updated: 10/13/2009 Dizziness or vertigo- chronic  GERD Seizure disorder Urinary incontinence MS plantar fasciitis s/p sx allergic rhinitis ADENOMATOUS COLON POLYPS 08/2008 GASTRITIS /H.PYLORI +08/2008 lung nodule  menopausal disorder anxiety  counselor -Evalina Field  cardilogy--Dr [facility] neurology-- Dr Sandria Manly   Past Surgical History: Last updated: 07/22/2009 ANTERIOR CERVICAL DISKECTOMY and fusion, c4-5, c5-6, c6-7. Dr. Donalee Citrin. 2004 TUBAL LIGATION, HX OF (ICD-V26.51) DILATION AND CURETTAGE, HX OF (ICD-V45.89) TONSILLECTOMY, HX OF (ICD-V45.79) FOOT SURGERY, HX OF (ICD-V15.89) CHOLECYSTECTOMY, HX OF (ICD-V45.79) 9/10 chest CT with small 4mm nodule L lung base (rec re check in 1 yr) 5/11 re check  chest CT- lung nodule stable 12/10 sinus CT negative  6/10 colonosco- polyps /- re check 5y 6/10 EGD- erosive gastritis , h pylori (treated )  Family History: Last updated: 11/26/2009 Father: CAD 6 bypass Mother: migraines, CAD - died of MI Siblings: 2 brothers, 1 sister cousin- CAD gm- CAD aunt- colon cancer 23s MGF- colon cancer 84s aunt- lung cancer  Social History: Last updated: 07/30/2008 Marital Status: Married Children: 3 Occupation: disabled- from [place] smokes 1/2 to 1ppd  Alcohol Use - no Daily Caffeine Use-2  Risk Factors: Smoking Status: quit (09/30/2006)  Family History: Father: CAD 6 bypass Mother: migraines, CAD - died of MI Siblings: 2 brothers, 1 sister cousin- CAD gm- CAD aunt- colon cancer 31s MGF- colon cancer 70s aunt- lung cancer  Review of Systems General:  Denies chills, fatigue, and fever. Eyes:  Denies blurring and eye irritation. ENT:  Complains of difficulty swallowing, hoarseness, and sore throat; denies nasal congestion, postnasal drainage, and sinus pressure. CV:  Denies chest pain or discomfort, lightheadness, palpitations, and shortness of breath with exertion. Resp:  Denies cough, shortness of breath, and wheezing. GI:  Denies abdominal pain, indigestion, nausea, and vomiting. Derm:  Denies itching, lesion(s), poor wound healing, and rash. Neuro:  Denies numbness and tingling. Psych:  depression is improving. Endo:  Denies excessive thirst and excessive urination. Heme:  Denies abnormal bruising, bleeding, and enlarge lymph nodes.  Physical Exam  General:  overweight but generally well appearing - seems less fatigued today Head:  normocephalic, atraumatic, and no abnormalities observed.  no sinus or temporal tenderness Eyes:  vision grossly intact, pupils equal, pupils round, pupils reactive to light, and no injection.   Ears:  R ear normal and L ear normal.   Nose:  nares are boggy Mouth:  denture top plate out  pharynx pink  and moist, no erythema, and no exudates.   Neck:  supple with full rom and no masses or thyromegally, no JVD or carotid bruit  pt was vaguely tender in submandibular area without palpable swelling Chest Wall:  No deformities, masses, or tenderness noted. Lungs:  CTA with diffusely distant bs no wheeze today no rales or rhonchi Heart:  Normal rate and regular rhythm. S1 and S2 normal without gallop, murmur, click, rub or other extra sounds. Abdomen:  soft and non-tender.   Extremities:  No clubbing, cyanosis, edema, or deformity noted with normal full range of motion of all joints.   Neurologic:  cranial nerves II-XII intact, sensation intact to light touch, gait normal, and DTRs symmetrical and normal.   Skin:  Intact without suspicious lesions or rashes Cervical Nodes:  No lymphadenopathy noted Psych:  nl affect today   Impression & Recommendations:  Problem # 1:  SORE THROAT (ICD-462) Assessment New with ongoing burning and dysphagia in smoker (also some mouth pain ) ongoing and worsening -- esp after ant neck surgery nl exam for the most part but some hoarseness disc poss ? MS is playing a role will ref to ENT for further eval Orders: ENT Referral (ENT)  Problem # 2:  OTHER DYSPHAGIA (ZOX-096.04) Assessment: New see above  Orders: ENT Referral (ENT)  Problem # 3:  TOBACCO USE (ICD-305.1) Assessment: Unchanged discussed in detail risks of smoking, and possible outcomes including COPD, vascular dz, cancer and also respiratory infections/sinus problems  pt is getting more motivated to quit  she does have a lung nodule that is being watched   Complete Medication List: 1)  Crestor 20 Mg Tabs (Rosuvastatin calcium) .... Take one half by mouth daily 2)  Flexeril 10 Mg Tabs (Cyclobenzaprine hcl) .... Take one by mouth in am and 2 tabs in pm 3)  Clarinex 5 Mg Tabs (Desloratadine) .Marland Kitchen.. 1 by mouth once daily as needed 4)  Omeprazole 20 Mg Cpdr (Omeprazole) .Marland Kitchen.. 1 by mouth two  times a day 5)  Flonase 50 Mcg/act Susp (Fluticasone propionate) .... 2 sprays in each nostril daily as needed 6)  Nystatin 100000 Unit/gm Crea (Nystatin) .... Apply once daily to affected area as needed 7)  Proventil Hfa 108 (90 Base) Mcg/act Aers (Albuterol sulfate) .... 2 puffs up to every 4 hours as needed wheezing 8)  Gabapentin 600 Mg Tabs (Gabapentin) .... Take one by mouth every 2 hours, total of 6 daily 9)  Beta Seron Shots  .... For ms - dr love 10)  Robinul-forte 2 Mg Tabs (Glycopyrrolate) .Marland Kitchen.. 1 tablet twice a day 11)  Align Caps (Probiotic product) .... Take 1 tab samples given 12)  Analpram E 2.5-1 & 1 % Kit (Hydrocortisone ace-pramoxine) .... Apply two times a day to rectum as needed 13)  Anusol-hc 25 Mg Supp (Hydrocortisone acetate) .... Use rectally two times a day as needed 14)  Robitussin Chest Congestion 100 Mg/54ml Syrp (Guaifenesin) .... Otc as directed. 15)  Zoloft 100 Mg Tabs (Sertraline hcl) .Marland Kitchen.. 1 by mouth once daily 16)  Alprazolam 0.5 Mg Tabs (  Alprazolam) .Marland Kitchen.. 1 by mouth up to two times a day as needed severe anxiety - only use if necessary 17)  Duke's Magic Mouthwash  .... 1 -2 teaspoons gargle and spit three times a day as needed mouth and throat discomfort  Patient Instructions: 1)  we will do ENT doctor referral at check out  2)  try the magic mouthwash for burning  3)  update me if symptoms worsen Prescriptions: DUKE'S MAGIC MOUTHWASH 1 -2 teaspoons gargle and spit three times a day as needed mouth and throat discomfort  #120cc x 0   Entered and Authorized by:   Judith Part MD   Signed by:   Judith Part MD on 11/26/2009   Method used:   Print then Give to Patient   RxID:   [id number]   Current Allergies (reviewed today): ! AMITRIPTYLINE HCL ! LYRICA BIAXIN LIPITOR SUDAFED * CYMBALTA LEVAQUIN

## 2010-04-09 NOTE — Progress Notes (Signed)
Summary: Proventil HFA Inhaler  Phone Note From Pharmacy   [facility] Call For: Dr. Ermalene Searing  Summary of Call: Patient needs a new Rx. for Proventil HFA Inhaler.  The old Rx. is outdated.  This medication is not on the patient's meds list.  [facility] Initial call taken by: Delilah Shan,  May 20, 2008 10:36 AM  Follow-up for Phone Call        Dr. Milinda Antis pt.   px written on EMR for call in  Follow-up by: Kerby Nora MD,  May 20, 2008 12:19 PM    New/Updated Medications: PROVENTIL HFA 108 (90 BASE) MCG/ACT AERS (ALBUTEROL SULFATE) 2 puffs up to every 4 hours as needed wheezing   Prescriptions: PROVENTIL HFA 108 (90 BASE) MCG/ACT AERS (ALBUTEROL SULFATE) 2 puffs up to every 4 hours as needed wheezing  #1 mdi x 1   Entered by:   Liane Comber   Authorized by:   Kerby Nora MD   Signed by:   Liane Comber on 05/20/2008   Method used:   [facility]        [facility]  [address] [facility]retail)       [address]       [place], [place]  [address]       Ph: [phone] or [phone]       Fax: [phone]   RxID:   [id number]

## 2010-04-09 NOTE — Assessment & Plan Note (Signed)
Summary: fell off ladder/hurt right shoulder and right side of nect/rbh   Vital Signs:  Patient profile:   55 year old female Height:      61.5 inches Weight:      170.25 pounds BMI:     31.76 Temp:     98.5 degrees F oral Pulse rate:   72 / minute Pulse rhythm:   regular BP sitting:   100 / 74  (left arm) Cuff size:   large  Vitals Entered By: Lewanda Rife LPN (April 02, 2010 12:03 PM) CC: fell off ladder after Christmas, Pain rt shoulder and side of neck on rt especially when moves. Now pain level is 10 upon movement.   History of Present Illness: after christmas on step ladder painting   fell and hit her TV with R shoulder/ neck was extended  knot on back of neck  most of pain in shoulder -- in the back and laterally  not swollen  since then putting ice and heat on it and taking some excedrin   neck hurts to flex fully and extend fully (rotation is ok )   her eyelids are droopy and it is affecting her vision- needs to see someone and discuss surgery   cannot afford crestor every month=- wanted to see if we had any samples   could have uti  some pain over bladder  some frequency/ no burning / urine looks cloudy    Allergies: 1)  ! Amitriptyline Hcl 2)  ! Lyrica 3)  Biaxin 4)  Lipitor 5)  Sudafed 6)  * Cymbalta 7)  Levaquin  Past History:  Past Medical History: Last updated: 10/13/2009 Dizziness or vertigo- chronic  GERD Seizure disorder Urinary incontinence MS plantar fasciitis s/p sx allergic rhinitis ADENOMATOUS COLON POLYPS 08/2008 GASTRITIS /H.PYLORI +08/2008 lung nodule  menopausal disorder anxiety  counselor -Evalina Field  cardilogy--Dr [facility] neurology-- Dr Sandria Manly   Past Surgical History: Last updated: 07/22/2009 ANTERIOR CERVICAL DISKECTOMY and fusion, c4-5, c5-6, c6-7. Dr. Donalee Citrin. 2004 TUBAL LIGATION, HX OF (ICD-V26.51) DILATION AND CURETTAGE, HX OF (ICD-V45.89) TONSILLECTOMY, HX OF (ICD-V45.79) FOOT SURGERY, HX OF  (ICD-V15.89) CHOLECYSTECTOMY, HX OF (ICD-V45.79) 9/10 chest CT with small 4mm nodule L lung base (rec re check in 1 yr) 5/11 re check chest CT- lung nodule stable 12/10 sinus CT negative  6/10 colonosco- polyps /- re check 5y 6/10 EGD- erosive gastritis , h pylori (treated )  Family History: Last updated: 11/26/2009 Father: CAD 6 bypass Mother: migraines, CAD - died of MI Siblings: 2 brothers, 1 sister cousin- CAD gm- CAD aunt- colon cancer 69s MGF- colon cancer 57s aunt- lung cancer  Social History: Last updated: 07/30/2008 Marital Status: Married Children: 3 Occupation: disabled- from [place] smokes 1/2 to 1ppd  Alcohol Use - no Daily Caffeine Use-2  Risk Factors: Smoking Status: quit (09/30/2006)  Review of Systems General:  Complains of fatigue; denies fever, loss of appetite, and malaise. Eyes:  Denies blurring and eye irritation; not blurring but partially obscured vision. CV:  Denies chest pain or discomfort, palpitations, and shortness of breath with exertion. Resp:  Denies cough, shortness of breath, and wheezing. GI:  Complains of abdominal pain; denies change in bowel habits, nausea, and vomiting. GU:  Denies dysuria and urinary frequency. MS:  Complains of joint pain, muscle aches, and stiffness; denies joint redness and joint swelling. Derm:  Denies itching, lesion(s), poor wound healing, and rash. Neuro:  Complains of weakness; denies numbness and tingling. Psych:  Denies anxiety and depression. Endo:  Denies cold intolerance, excessive thirst, excessive urination, and heat intolerance. Heme:  Denies abnormal bruising and bleeding.  Physical Exam  General:  overwt and fatigued appearing  Head:  normocephalic, atraumatic, and no abnormalities observed.   Eyes:  eyelid droop worse on R than L pupils equal, pupils round, and pupils reactive to light.   Mouth:  pharynx pink and moist.   Neck:  supple with full rom and no masses or thyromegally, no JVD or  carotid bruit  Chest Wall:  No deformities, masses, or tenderness noted. Lungs:  CTA with diffusely distant bs no wheeze today no rales or rhonchi Heart:  Normal rate and regular rhythm. S1 and S2 normal without gallop, murmur, click, rub or other extra sounds. Msk:  R shoulder- tender acromion  pos hawking and neer tests  no swelling or crepitice abd passive and active limited to 90 deg with pain  pain on int and ext rot - cannot scatch back no trap tenderness mild lower CS tenderness  Pulses:  R and L carotid,radial,femoral,dorsalis pedis and posterior tibial pulses are full and equal bilaterally Extremities:  no CCE Neurologic:  strength normal in all extremities, sensation intact to light touch, gait normal, and DTRs symmetrical and normal.   Skin:  Intact without suspicious lesions or rashes Cervical Nodes:  No lymphadenopathy noted Psych:  normal affect, talkative and pleasant    Impression & Recommendations:  Problem # 1:  SHOULDER PAIN, RIGHT (ICD-719.41) Assessment Deteriorated acute on chronic with limited rom after a fall and trauma x ray today and then adv  per exam rot cuff pathology is possible Her updated medication list for this problem includes:    Flexeril 10 Mg Tabs (Cyclobenzaprine hcl) .Marland Kitchen... Take one by mouth in am and 2 tabs in pm  Orders: T-Shoulder Right (73030TC) T-Cervicle Spine 2-3 Views (09811BJ)  Problem # 2:  NECK PAIN (ICD-723.1) Assessment: New new -- after what sounds like hyperextension after a fall in dec pain to fully flex x ray and update Her updated medication list for this problem includes:    Flexeril 10 Mg Tabs (Cyclobenzaprine hcl) .Marland Kitchen... Take one by mouth in am and 2 tabs in pm  Orders: T-Shoulder Right (73030TC) T-Cervicle Spine 2-3 Views [phone])  Problem # 3:  ABDOMINAL PAIN, LOWER (ICD-789.09) Assessment: New  pt feels like she is getting uti no dysuria however ua negative inst to drink more water  Her updated  medication list for this problem includes:    Flexeril 10 Mg Tabs (Cyclobenzaprine hcl) .Marland Kitchen... 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 Take one half by mouth daily 2)  Flexeril 10 Mg Tabs (Cyclobenzaprine hcl) .... Take one by mouth in am and 2 tabs in pm 3)  Clarinex 5 Mg Tabs (Desloratadine) .Marland Kitchen.. 1 by mouth once daily as needed 4)  Omeprazole 20 Mg Cpdr (Omeprazole) .Marland Kitchen.. 1 by mouth two times a day 5)  Flonase 50 Mcg/act Susp (Fluticasone propionate) .... 2 sprays in each nostril daily as needed 6)  Nystatin 100000 Unit/gm Crea (Nystatin) .... Apply once daily to affected area as needed 7)  Proventil Hfa 108 (  90 Base) Mcg/act Aers (Albuterol sulfate) .... 2 puffs up to every 4 hours as needed wheezing 8)  Gabapentin 600 Mg Tabs (Gabapentin) .... Take one by mouth every 2 hours, total of 6 daily 9)  Beta Seron Shots  .... For ms - dr love 10)  Robinul-forte 2 Mg Tabs (Glycopyrrolate) .Marland Kitchen.. 1 tablet twice a day 11)  Align Caps (Probiotic product) .... Take 1 tab samples given 12)  Analpram E 2.5-1 & 1 % Kit (Hydrocortisone ace-pramoxine) .... Apply two times a day to rectum as needed 13)  Anusol-hc 25 Mg Supp (Hydrocortisone acetate) .... Use rectally two times a day as needed 14)  Robitussin Chest Congestion 100 Mg/57ml Syrp (Guaifenesin) .... Otc as directed. 15)  Zoloft 100 Mg Tabs (Sertraline hcl) .Marland Kitchen.. 1 by mouth once daily 16)  Alprazolam 0.5 Mg Tabs (Alprazolam) .Marland Kitchen.. 1 by mouth up to two times a day as needed severe anxiety - only use if necessary 17)  Duke's Magic Mouthwash  .... 1 -2 teaspoons gargle and spit three times a day as  needed mouth and throat discomfort  Patient Instructions: 1)  x rays today- then I will update you with a plan when I get results  2)  continue heat on shoulder/ neck as needed 3)  we will refer you to eye doctor at check out    Orders Added: 1)  T-Shoulder Right [73030TC] 2)  T-Cervicle Spine 2-3 Views [72040TC] 3)  Ophthalmology Referral [Ophthalmology] 4)  UA Dipstick W/ Micro (manual) [81000] 5)  Est. Patient Level IV [60454]    Current Allergies (reviewed today): ! AMITRIPTYLINE HCL ! Lauro Franklin [facility]  Laboratory Results   Urine Tests  Date/Time Received: April 02, 2010 12:46 PM  Date/Time Reported: April 02, 2010 12:46 PM   Routine Urinalysis   Color: yellow Appearance: Clear Glucose: negative   (Normal Range: Negative) Bilirubin: negative   (Normal Range: Negative) Ketone: negative   (Normal Range: Negative) Spec. Gravity: 1.015   (Normal Range: 1.003-1.035) Blood: negative   (Normal Range: Negative) pH: 6.0   (Normal Range: 5.0-8.0) Protein: negative   (Normal Range: Negative) Urobilinogen: 0.2   (Normal Range: 0-1) Nitrite: negative   (Normal Range: Negative) Leukocyte Esterace: negative   (Normal Range: Negative)  Urine Microscopic WBC/HPF: 0 RBC/HPF: 0 Bacteria/HPF: 0 Mucous/HPF: 0 Epithelial/HPF: few Crystals/HPF: 0 Casts/LPF: 0 Yeast/HPF: 0 Other: 0        Appended Document: fell off ladder/hurt right shoulder and right side of nect/rbh please let pt know that ua was negative - please inst her to inc water intake and update me if symptoms do not improve   Appended Document: fell off ladder/hurt right shoulder and right side of nect/rbh Patient notified as instructed by telephone.

## 2010-04-09 NOTE — Progress Notes (Signed)
  Phone Note Outgoing Call   Summary of Call: I was informed by pt's counselor that her dep was much worse not suicidal but isolating herself and anxious  Follow-up for Phone Call        I spoke to pt by phone and we disc symptoms of inc depression and anx  is going through lots of stress and hormone change/ also caring for family member and much stress agreed to inc her zoloft to 100 mg  will start this tomorrow and called into pharm disc poss side eff  if worse or SI - voiced understanding to seek care asap and alert me  she will f/u aug 8 as planned and also with her counselor next wk Follow-up by: Judith Part MD,  October 01, 2009 5:36 PM    New/Updated Medications: ZOLOFT 100 MG TABS (SERTRALINE HCL) 1 by mouth once daily Prescriptions: ZOLOFT 100 MG TABS (SERTRALINE HCL) 1 by mouth once daily  #30 x 11   Entered and Authorized by:   Judith Part MD   Signed by:   Judith Part MD on 10/01/2009   Method used:   [facility]        [facility]  [address] [facility]       [address]       [place], [place]  [address]       Ph: [phone] or [phone]       Fax: [phone]   RxID:   [id number]
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
## 2010-04-09 NOTE — Consult Note (Signed)
Summary: [facility]  [facility]   Imported By: Lanelle Bal 03/24/2009 09:53:21  _____________________________________________________________________  External Attachment:    Type:   Image     Comment:   External Document

## 2010-04-09 NOTE — Consult Note (Signed)
Summary: [facility]/Dr. Love   Imported By: Eleonore Chiquito 11/27/2007 11:25:11  _____________________________________________________________________  External Attachment:    Type:   Image     Comment:   External Document

## 2010-04-09 NOTE — Consult Note (Signed)
Summary: [facility]  [facility]   Imported By: Lanelle Bal 08/12/2009 09:16:45  _____________________________________________________________________  External Attachment:    Type:   Image     Comment:   External Document
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
## 2010-04-09 NOTE — Procedures (Signed)
Summary: Colonoscopy   Colonoscopy  Procedure date:  08/15/2008  Findings:      Location:  [facility].    Procedures Next Due Date:    Colonoscopy: 08/2013  COLONOSCOPY PROCEDURE REPORT  PATIENT:  Karina [name], Karina [name]  MR#:  [id number] BIRTHDATE:   Mar 17, 1955, 55 yrs. old   GENDER:   female  ENDOSCOPIST:   Judie Petit T. Russella Dar, MD, [facility] Referred by: Marne A. Milinda Antis, M.D.  PROCEDURE DATE:  08/15/2008 PROCEDURE:  Colonoscopy with biopsy and snare polypectomy ASA CLASS:   Class II INDICATIONS: 1) unexplained diarrhea  2) lower abdominal pain   MEDICATIONS:    Fentanyl 75 mcg IV, Versed 7 mg IV  DESCRIPTION OF PROCEDURE:   After the risks benefits and alternatives of the procedure were thoroughly explained, informed consent was obtained.  Digital rectal exam was performed and revealed no abnormalities.   The LB [id number]-[id number] endoscope was introduced through the anus and advanced to the terminal ileum which was intubated for a short distance, without limitations.  The quality of the prep was excellent, using MoviPrep.  The instrument was then slowly withdrawn as the colon was fully examined. <<PROCEDUREIMAGES>>              <<OLD IMAGES>>  FINDINGS:  The terminal ileum appeared normal. A normal appearing cecum, ileocecal valve, and appendiceal orifice were identified. The ascending, hepatic flexure, splenic flexure, descending, sigmoid colon, and rectum appeared unremarkable. Random biopsies were obtained throughout the colon and sent to pathology.  Two polyps were found in the mid transverse colon. They were 4 - 5 mm in size. Polyps were snared without cautery. Retrieval was successful. The remainder of the transverse colon was normal. Retroflexed views in the rectum revealed no abnormalities. The time to cecum =  3.75  minutes. The scope was then withdrawn (time =  11.75  min) from the patient and the procedure completed.  COMPLICATIONS:   None  ENDOSCOPIC IMPRESSION:  1) 4  - 5 mm Two polyps in the mid transverse colon  RECOMMENDATIONS:  1) await pathology results  2) If the polyps removed today are adenomatous (pre-cancerous), you will need a repeat colonoscopy in 5 years. Otherwise you should continue to follow colorectal cancer screening guidelines for "routine risk" patients with colonoscopy in 10 years. 3) Presumed IBS: trial of Robinul forte BID #60, 11 refills 4) EGD today     [name], MD, [facility]        REPORT OF SURGICAL PATHOLOGY   Case #: [id number] Patient Name: Karina [name], Karina [name] Office Chart Number:  [id number]   MRN: [id number] Pathologist: Alden Server A. Delila Spence, MD DOB/Age  03/17/1955 (Age: 55)    Gender: F Date Taken:  08/15/2008 Date Received: 08/16/2008   FINAL DIAGNOSIS   ***MICROSCOPIC EXAMINATION AND DIAGNOSIS***   1.  COLON, BIOPSY:  BENIGN COLONIC MUCOSA.  NO SIGNIFICANT INFLAMMATION OR OTHER ABNORMALITIES IDENTIFIED.    2.  TRANSVERSE COLON, POLYP(S):  ADENOMATOUS POLYP(S).  NO HIGH GRADE DYSPLASIA OR INVASIVE MALIGNANCY IDENTIFIED. (TWO)   3.  DUODENUM:  BENIGN SMALL BOWEL MUCOSA.  NO ACTIVE INFLAMMATION OR VILLOUS ATROPHY IDENTIFIED.   4.  STOMACH, BIOPSY:  CHRONIC, ACTIVE GASTRITIS WITH HELICOBACTER PYLORI.  NO DYSPLASIA OR MALIGNANCY IDENTIFIED.   COMMENT 1.  There is colorectal mucosa with normal crypt architecture and no objective increase in inflammation.  No active inflammation, microscopic colitis, collagenous colitis or significant chronic changes identified. No hyperplastic or adenomatous changes are seen, and there is no evidence of  malignancy.   3.  There is small bowel mucosa with normal villous architecture and no objective increase in inflammation.  No villous atrophy, active inflammation or other significant changes identified.   4.  A Warthin-Starry stain is performed to determine the possibility of the presence of Helicobacter pylori. Organisms of Helicobacter pylori are identified on  the Warthin-Starry stain. The control(s) stained appropriately. (EAA:mj 08/19/08)   mj Date Reported:  08/19/2008     Alden Server A. Delila Spence, MD   August 21, 2008 MRN: [id number]    [facility] [address] [place], [place]  [address]    Dear Ms. Reisen,  I am pleased to inform you that the colon polyp(s) removed during your recent colonoscopy was (were) found to be benign (no cancer detected) upon pathologic examination. The colonic biopsies were otherwise normal-no evidence of colitis. Continue treatment for irritable bowel syndrome.  I recommend you have a repeat colonoscopy examination in 5 years to look for recurrent polyps, as having colon polyps increases your risk for having recurrent polyps or even colon cancer in the future.  Should you develop new or worsening symptoms of abdominal pain, bowel habit changes or bleeding from the rectum or bowels, please schedule an evaluation with either your primary care physician or with me.  Continue treatment plan as outlined the day of your exam.  Please call us if you are having persistent problems or have questions about your condition that have not been fully answered at this time.  Sincerely,  Meryl Dare MD [facility]  This letter has been electronically signed by your physician.    This report was created from the original endoscopy report, which was reviewed and signed by the above listed endoscopist.

## 2010-04-09 NOTE — Letter (Signed)
Summary: E-Mail from Dr.Jane Rudean Curt from Dr.Jane Slovan   Imported By: Beau Fanny 10/21/2009 13:46:22  _____________________________________________________________________  External Attachment:    Type:   Image     Comment:   External Document

## 2010-04-09 NOTE — Progress Notes (Signed)
Summary: Alprazolam 0.5mg  refill  Phone Note Refill Request Call back at [phone] Message from:  Patient on November 19, 2009 10:34 AM  Refills Requested: Medication #1:  ALPRAZOLAM 0.5 MG TABS 1 by mouth up to two times a day as needed severe anxiety - only use if necessary. Pt saw Dr Laymond Purser and asked me for refill for alprazolam 0.5mg . [facility]. Pt is making appt on her way out of office to see Dr Milinda Antis re; feels like throat is closing up  on and off.Please advise.    Method Requested: Telephone to Pharmacy Initial call taken by: Lewanda Rife LPN,  November 19, 2009 10:36 AM  Follow-up for Phone Call        px written on EMR for call in will see at f/u  if severe sob - of course go to ER Follow-up by: Judith Part MD,  November 19, 2009 10:46 AM  Additional Follow-up for Phone Call Additional follow up Details #1::        Patient notified as instructed by telephone. [facility] as instructed. Lewanda Rife LPN  November 19, 2009 12:05 PM     Prescriptions: ALPRAZOLAM 0.5 MG TABS (ALPRAZOLAM) 1 by mouth up to two times a day as needed severe anxiety - only use if necessary  #30 x 0   Entered and Authorized by:   Judith Part MD   Signed by:   Lewanda Rife LPN on 45/40/9811   Method used:   Telephoned to ...       [facility]       [address]       [place], [place]  [address]       Ph: [phone] or [phone]       Fax: [phone]   RxID:   [id number]

## 2010-04-09 NOTE — Progress Notes (Signed)
Summary: alprazolam  Phone Note Refill Request Message from:  Fax from Pharmacy on January 27, 2010 11:33 AM  Refills Requested: Medication #1:  ALPRAZOLAM 0.5 MG TABS 1 by mouth up to two times a day as needed severe anxiety - only use if necessary   Supply Requested: [phone]   Method Requested: Telephone to Pharmacy Initial call taken by: Benny Lennert CMA Duncan Dull),  January 27, 2010 11:34 AM  Follow-up for Phone Call        px written on EMR for call in  Follow-up by: Judith Part MD,  January 27, 2010 3:07 PM  Additional Follow-up for Phone Call Additional follow up Details #1::        [facility] as instructed. Lewanda Rife LPN  January 27, 2010 3:47 PM     Prescriptions: ALPRAZOLAM 0.5 MG TABS (ALPRAZOLAM) 1 by mouth up to two times a day as needed severe anxiety - only use if necessary  #30 x 0   Entered and Authorized by:   Judith Part MD   Signed by:   Lewanda Rife LPN on 95/63/8756   Method used:   Telephoned to ...       [facility] ([facility]ail)       [address]       [place], [place]  [address]       Ph: [phone] or [phone]       Fax: [phone]   RxID:   [id number]

## 2010-04-09 NOTE — Assessment & Plan Note (Signed)
Summary: SWOLLEN,FEET,ANKLES,STOMACH,HANDS   Vital Signs:  Patient Profile:   55 Years Old Female Weight:      176 pounds Temp:     97.9 degrees F oral Pulse rate:   60 / minute Pulse rhythm:   regular BP sitting:   112 / 74  (left arm)  Vitals Entered By: Lowella Petties (October 03, 2006 3:18 PM)               Chief Complaint:  Swelling.  History of Present Illness: is having trouble with swelling all over- esp her feet and legs- quite a bit worse at end of day feet hurt and burn at end of day- so bad she can hardly walk- and can't stand covers on her has had diarrhea- no constipation diet has not changed, no fatty foods has never had this before  this has gotten worse since she had gallbladder taken out did check for blood clot in leg right after sx- and was neg a lot of gas and lower abd pain after she eats swollen abdomen too urine has bad smell- for about 11/2 no burning, but does have frequency a little itching vaginal too thought she had some shortness of breath last week cannot breathe well if she sleeps on back, but lying on stomach is better    Current Allergies: BIAXIN LIPITOR SUDAFED * CYMBALTA     Review of Systems      See HPI  General      Complains of fatigue.      Denies fever and loss of appetite.  Eyes      Denies blurring.  CV      Complains of difficulty breathing at night.      Denies chest pain or discomfort.  Resp      Denies cough.  GI      Complains of diarrhea.      Denies bloody stools, dark tarry stools, nausea, and vomiting.  GU      Complains of urinary frequency.      Denies dysuria and urinary hesitancy.  MS      chronic pain in feet  Derm      Denies changes in color of skin and rash.  Neuro      Complains of numbness.  Psych      feels quite stressed from her health condition  Endo      Complains of excessive urination.      Denies excessive thirst.   Physical Exam  General:     overwt but  well app Head:     Normocephalic and atraumatic without obvious abnormalities. No apparent alopecia or balding. Eyes:     vision grossly intact, pupils equal, pupils round, and pupils reactive to light.  no icterus Ears:     R ear normal and L ear normal.   Mouth:     pharynx pink and moist.   Neck:     No deformities, masses, or tenderness noted.no masses, no thyromegaly, and no JVD.   Lungs:     Normal respiratory effort, chest expands symmetrically. Lungs are clear to auscultation, no crackles or wheezes. Heart:     Normal rate and regular rhythm. S1 and S2 normal without gallop, murmur, click, rub or other extra sounds. Abdomen:     tender suprapubic, no rebound or guarding soft, normal bowel sounds, no distention, no masses, no hepatomegaly, and no splenomegaly.   Msk:     normal ROM, no joint tenderness, no joint  swelling, and no joint warmth.   Pulses:     R and L carotid,radial,femoral,dorsalis pedis and posterior tibial pulses are full and equal bilaterally Extremities:     trace left pedal edema and trace right pedal edema.   Neurologic:     sensation intact to light touch, gait normal, and DTRs symmetrical and normal.  no tremor Skin:     turgor normal, color normal, no rashes, and ruddy.   Cervical Nodes:     No lymphadenopathy noted Psych:     pt seems fatigued and stressed    Impression & Recommendations:  Problem # 1:  FREQUENCY, URINARY (ICD-788.41) suspect low grade uti based on micro of urine will inc her water intake will tx with cipro anc cx urine Orders: UA Dipstick W/ Micro (81000) T-Culture, Urine ([address])   Problem # 2:  SYMPTOM, EDEMA (ICD-782.3) will check lab work  low index of susp to check heart if no imp will disc further at f/u in 2 weeks if no abnormal labs, will consider low dose diuretic Orders: Venipuncture ([address]) TLB-Renal Function Panel (80069-RENAL) TLB-CBC Platelet - w/Differential (85025-CBCD) TLB-Hepatic/Liver  Function Pnl (80076-HEPATIC) TLB-TSH (Thyroid Stimulating Hormone) (84443-TSH) TLB-A1C / Hgb A1C (Glycohemoglobin) (83036-A1C)   Problem # 3:  ABDOMINAL PAIN, SUPRAPUBIC (ICD-789.09) suspect from uti with some post ccy diarrhea (or IBS) urged to start daily fiber supplement   Her updated medication list for this problem includes:    Flexeril 10 Mg Tabs (Cyclobenzaprine hcl) .Marland Kitchen... Take one by mouth daily   Medications Added to Medication List This Visit: 1)  Cipro 250 Mg Tabs (Ciprofloxacin hcl) .Marland Kitchen.. 1 by mouth two times a day for 5 days   Patient Instructions: 1)  we are going to check some labs for swelling and infection 2)  try some fiber like citrucel once daily 3)  take the antibiotic (cipro) as directed for urinary tract infection 4)  make sure to drink enough water 5)  follow up in 2 weeks   Prescriptions: CIPRO 250 MG  TABS (CIPROFLOXACIN HCL) 1 by mouth two times a day for 5 days  #10 x 0   Entered and Authorized by:   Judith Part MD   Signed by:   Judith Part MD on 10/03/2006   Method used:   Print then Give to Patient   RxID:   [id number]      Laboratory Results   Urine Tests  Date/Time Recieved: October 03, 2006 3:57 PM  Date/Time Reported: October 03, 2006 3:57 PM   Routine Urinalysis   Color: yellow Appearance: Clear Glucose: negative   (Normal Range: Negative) Bilirubin: negative   (Normal Range: Negative) Ketone: negative   (Normal Range: Negative) Spec. Gravity: 1.015   (Normal Range: 1.003-1.035) Blood: negative   (Normal Range: Negative) pH: 6.0   (Normal Range: 5.0-8.0) Protein: negative   (Normal Range: Negative) Urobilinogen: 0.2   (Normal Range: 0-1) Nitrite: negative   (Normal Range: Negative) Leukocyte Esterace: negative   (Normal Range: Negative)

## 2010-04-09 NOTE — Procedures (Signed)
Summary: EGD   EGD  Procedure date:  08/15/2008  Findings:      Location: [facility]    ENDOSCOPY PROCEDURE REPORT  PATIENT:  Karina [name], Karina [name]  MR#:  [id number] BIRTHDATE:   [birth year], 52 yrs. old   GENDER:   female  ENDOSCOPIST:   Judie Petit T. Russella Dar, MD, [facility] Referred by: Marne A. Milinda Antis, M.D.  PROCEDURE DATE:  08/15/2008 PROCEDURE:  EGD with biopsy ASA CLASS:   Class II INDICATIONS: GERD, diarrhea   MEDICATIONS:   Fentanyl 25 mcg IV, Versed 3 mg IV, There was residual sedation effect present from prior procedure. TOPICAL ANESTHETIC:   Exactacain Spray  DESCRIPTION OF PROCEDURE:   After the risks benefits and alternatives of the procedure were thoroughly explained, informed consent was obtained.  The LB GIF-H180 [id number] endoscope was introduced through the mouth and advanced to the second portion of the duodenum, without limitations.  The instrument was slowly withdrawn as the mucosa was fully examined. <<PROCEDUREIMAGES>>          <<OLD IMAGES>>  Moderate gastritis was found in the antrum. It was erosive and erythematous. Multiple biopsies were obtained and sent to pathology.  The duodenal bulb was normal in appearance, as was the postbulbar duodenum. Random biopsies were obtained and sent to pathology.  he esophagus and gastroesophageal junction were completely normal in appearance. Otherwise the examination was normal. Retroflexed views revealed no abnormalities.  The scope was then withdrawn from the patient and the procedure completed.  COMPLICATIONS:   None  ENDOSCOPIC IMPRESSION:  1) Moderate erosive gastritis  RECOMMENDATIONS:  1) await pathology results  2) anti-reflux regimen  3) PPI qam  4) avoid NSAIDs    Karina T. Russella Dar, MD, [facility]          August 21, 2008 MRN: [id number]    [facility] [address] [place], [place]  [address]    Dear Ms. Ken,  I am pleased to inform you that the biopsies taken during your recent  endoscopic examination did not show any evidence of cancer upon pathologic examination. The biopies showed gastritis with H. pylori infection. Please complete the antibiotics prescribed.  Continue with the treatment plan as outlined on the day of your      exam.  Please call us if you are having persistent problems or have questions about your condition that have not been fully answered at this time.  Sincerely,  Meryl Dare MD [facility]  This letter has been electronically signed by your physician.    August 21, 2008 MRN: [id number]    Karina [name] [address] [place], [place]  [address]    Dear Ms. Hegner,  I am pleased to inform you that the biopsies taken during your recent endoscopic examination did not show any evidence of cancer upon pathologic examination. The biopies showed gastritis with H. pylori infection. Please complete the antibiotics prescribed.  Continue with the treatment plan as outlined on the day of your      exam.  Please call us if you are having persistent problems or have questions about your condition that have not been fully answered at this time.  Sincerely,  Meryl Dare MD [facility]  This letter has been electronically signed by your physician.     This report was created from the original endoscopy report, which was reviewed and signed by the above listed endoscopist.   /

## 2010-04-09 NOTE — Progress Notes (Signed)
Summary: ?sinus infection  Phone Note Call from Patient Call back at [phone]   Caller: Patient Call For: Judith Part MD Summary of Call: Pt was seen in office yesterday 09/01/09. Pt thought med for sinus infection was going to be called in with her other meds (pt said usually gets Augmentin) but no antibiotic was called in. Face is sore and head is stuffy, sorethroat, left earache and no fever. Pt is using the saline spray and nose spray with no relief. [phone]. Please advise.   Initial call taken by: Lewanda Rife LPN,  September 02, 2009 4:40 PM  Follow-up for Phone Call        please call in augmentin 875 1 by mouth two times a day for 10 days #20 no ref update me if not improved  Follow-up by: Judith Part MD,  September 02, 2009 5:10 PM  Additional Follow-up for Phone Call Additional follow up Details #1::        Patient notified as instructed by telephone. [facility]. Med l;ist updated.Lewanda Rife LPN  September 02, 2009 5:14 PM

## 2010-04-09 NOTE — Assessment & Plan Note (Signed)
Summary: F/U CT Results/ Abd cramping/pp   History of Present Illness Visit Type: Follow-up Visit Primary GI MD: Elie Goody MD [facility] Primary [name] Chief Complaint: pt states she still occasionally gets a stomachache after eating, and she is still having burning with bowel movements History of Present Illness:   Karina [name] returns today complaining of burning rectal pain with each bowel movement. Her symptoms have been present for several weeks. She has occasional lower abdominal pain after meals, but this symptom has improved substantially on Robinul. Recent abdominal and pelvic CT scan showed slight gastric wall thickening, which was felt most likely to be due to under distention of the stomach. Endoscopy performed in June showed Helicobacter pylori gastritis, which was treated.   GI Review of Systems      Denies abdominal pain, acid reflux, belching, bloating, chest pain, dysphagia with liquids, dysphagia with solids, heartburn, loss of appetite, nausea, vomiting, vomiting blood, weight loss, and  weight gain.      Reports rectal pain.     Denies anal fissure, black tarry stools, change in bowel habit, constipation, diarrhea, diverticulosis, fecal incontinence, heme positive stool, hemorrhoids, irritable bowel syndrome, jaundice, light color stool, liver problems, and  rectal bleeding.   Current Medications (verified): 1)  Crestor 20 Mg  Tabs (Rosuvastatin Calcium) .... Take One Half By Mouth Daily 2)  Prozac 10 Mg  Caps (Fluoxetine Hcl) .... Take One By Mouth Daily 3)  Flexeril 10 Mg  Tabs (Cyclobenzaprine Hcl) .... Take One By Mouth Daily 4)  Clarinex 5 Mg  Tabs (Desloratadine) .Marland Kitchen.. 1 By Mouth Once Daily As Needed 5)  Omeprazole 20 Mg  Cpdr (Omeprazole) .Marland Kitchen.. 1 By Mouth Two Times A Day 6)  Flonase 50 Mcg/act  Susp (Fluticasone Propionate) .... 2 Sprays in Each Nostril Daily 7)  Nystatin 100000 Unit/gm Crea (Nystatin) .... Apply Once Daily To Affected Area 8)   Proventil Hfa 108 (90 Base) Mcg/act Aers (Albuterol Sulfate) .... 2 Puffs Up To Every 4 Hours As Needed Wheezing 9)  Gabapentin 600 Mg Tabs (Gabapentin) .... Take One By Mouth Every 2 Hours, Total of 6 Daily 10)  Beta Seron Shots .... For Ms - Dr Sandria Manly 11)  Robinul-Forte 2 Mg  Tabs (Glycopyrrolate) .Marland Kitchen.. 1 Tablet Twice A Day 12)  Align  Caps (Probiotic Product) .... Take 1 Tab Samples Given  Allergies (verified): 1)  ! Amitriptyline Hcl 2)  ! Lyrica 3)  Biaxin 4)  Lipitor 5)  Sudafed 6)  * Cymbalta 7)  Levaquin  Past History:  Past Medical History: Dizziness or vertigo- chronic  GERD Seizure disorder Urinary incontinence MS plantar fasciitis s/p sx allergic rhinitis ADENOMATOUS COLON POLYPS 08/2008 GASTRITIS /H.PYLORI +08/2008  cardilogy--Dr [facility] neurology-- Dr Sandria Manly   Past Surgical History: ANTERIOR CERVICAL DISKECTOMY, HX OF TUBAL LIGATION, HX OF (ICD-V26.51) DILATION AND CURETTAGE, HX OF (ICD-V45.89) TONSILLECTOMY, HX OF (ICD-V45.79) FOOT SURGERY, HX OF (ICD-V15.89) CHOLECYSTECTOMY, HX OF (ICD-V45.79)  6/10 colonosco- polyps /- re check 5y 6/10 EGD- erosive gastritis , h pylori (treated )  Family History: Reviewed history from 07/30/2008 and no changes required. Father: CAD 6 bypass Mother: migraines, CAD - died of MI Siblings: 2 brothers, 1 sister cousin- CAD gm- CAD aunt- colon cancer 96s MGF- colon cancer 69s  Social History: Reviewed history from 07/30/2008 and no changes required. Marital Status: Married Children: 3 Occupation: disabled- from [place] smokes 1/2 to 1ppd  Alcohol Use - no Daily Caffeine Use-2  Review of Systems  The pertinent positives and negatives are noted as above and in the HPI. All other ROS were reviewed and were negative.   Vital Signs:  Patient profile:   55 year old female Height:      61.5 inches Weight:      180 pounds BMI:     33.58 Pulse rate:   68 / minute Pulse rhythm:   regular BP sitting:   108 / 66   (left arm) Cuff size:   regular  Vitals Entered By: Francee Piccolo CMA Duncan Dull) (November 07, 2008 10:37 AM)  Physical Exam  General:  Well developed, well nourished, no acute distress. Head:  Normocephalic and atraumatic. Eyes:  PERRLA, no icterus. Mouth:  No deformity or lesions, dentition normal. Lungs:  Clear throughout to auscultation. Heart:  Regular rate and rhythm; no murmurs, rubs,  or bruits. Abdomen:  Soft, nontender and nondistended. No masses, hepatosplenomegaly or hernias noted. Normal bowel sounds. Rectal:  Erythematous external hemorrhoids. Anal canal tenderness. No fissure noted. Hemoccult negative stool. Psych:  depressed affect.     Impression & Recommendations:  Problem # 1:  HEMORRHOIDS-EXTERNAL (ICD-455.3) Begin AnaMantle cream and Anusol-HC suppositories both twice daily as needed. Standard rectal care instructions including sitz bath. If her symptoms do not improve, consider empiric treatment for an anal fissure, although this has not been noted on recent exams.  Problem # 2:  ABNORMAL FINDINGS GI TRACT (ICD-793.4) Gastric wall thickening on recent CT scan, likely secondary to under distention. No need for further evaluation given recent endoscopy.  Problem # 3:  GASTRITIS (ICD-535.50) Helicobacter Pylori gastritis, recently treated.  Problem # 4:  IRRITABLE BOWEL SYNDROME (ICD-564.1) Continue Robinul forte twice daily as needed.  Patient Instructions: 1)  Copy sent to : Roxy Manns MD 2)  Your prescriptions have been sent to your pharmacy. 3)  Hemorrhoid care instructions given. 4)  The medication list was reviewed and reconciled.  All changed / newly prescribed medications were explained.  A complete medication list was provided to the patient / caregiver.  Prescriptions: ANUSOL-HC 25 MG SUPP (HYDROCORTISONE ACETATE) use rectally two times a day as needed  #30 x 2   Entered by:   Chales Abrahams CMA (AAMA)   Authorized by:   Meryl Dare MD [facility]    Signed by:   Chales Abrahams CMA (AAMA) on 11/07/2008   Method used:   [facility]        [facility] [id number]       [address]       [place], [place]  [address]       Ph: [phone] or [phone]       Fax: [phone]   RxID:   [id number] ANALPRAM E 2.5-1 & 1 % KIT (HYDROCORTISONE ACE-PRAMOXINE) apply two times a day to rectum as needed  #1 kit x 2   Entered by:   Chales Abrahams CMA (AAMA)   Authorized by:   Meryl Dare MD [facility]   Signed by:   Chales Abrahams CMA (AAMA) on 11/07/2008   Method used:   [facility]        [facility] (retail)       [address]       [place], [place]  [address]       Ph: [phone] or [phone]       Fax: [phone]   RxID:   [id number]

## 2010-04-09 NOTE — Miscellaneous (Signed)
Summary: rx robinul  Clinical Lists Changes  Medications: Added new medication of ROBINUL-FORTE 2 MG  TABS (GLYCOPYRROLATE) 1 tablet twice a day - Signed Rx of ROBINUL-FORTE 2 MG  TABS (GLYCOPYRROLATE) 1 tablet twice a day;  #60 x 11;  Signed;  Entered by: Sherren Kerns RN;  Authorized by: Meryl Dare MD [facility];  Method used: [facility], [place], [place]  [address], Ph: [phone] or [phone], Fax: [phone] Observations: Added new observation of ALLERGY REV: Done (08/15/2008 16:29)    Prescriptions: ROBINUL-FORTE 2 MG  TABS (GLYCOPYRROLATE) 1 tablet twice a day  #60 x 11   Entered by:   Sherren Kerns RN   Authorized by:   Meryl Dare MD [facility]   Signed by:   Sherren Kerns RN on 08/15/2008   Method used:   Electronically to        [facility] (retail)       [address]       [place], [place]  [address]       Ph: [phone] or [phone]       Fax: [phone]   RxID:   [id number]

## 2010-04-09 NOTE — Miscellaneous (Addendum)
Summary: [place] Medicare Consent Form  [place] Medicare Consent Form   Imported By: Beau Fanny 02/13/2009 11:44:48  _____________________________________________________________________  External Attachments:     1. Type:   Image          Comment:   External Document    2. Type:   Image          Comment:   External Document

## 2010-04-09 NOTE — Assessment & Plan Note (Signed)
Summary: F/U SHOULDER PAIN/DLO   Vital Signs:  Patient profile:   55 year old female Height:      61.5 inches Weight:      181.2 pounds BMI:     33.80 Temp:     97.5 degrees F oral Pulse rate:   76 / minute Pulse rhythm:   regular BP sitting:   100 / 70  (right arm) Cuff size:   regular  Vitals Entered By: Benny Lennert CMA (Jul 31, 2008 11:59 AM)  History of Present Illness: Chief complaint follow up shoulder pain  55 year old female with a history of cervical decompression done by [facility] in the past and a history of MS seen previously, a patient of Dr. Royden Purl:  I reviewed her cervical and thoracic spine MRIs today. She does have some mild bulging and degenerative changes, however there is no focal lesion.  She is primarily complaining of pain in her RIGHT lower parascapular musculature and her upper trap, which does bother her sometimes she is trying to lift things. At this point she is not complaining significantly of overhead activities, pain in the true shoulder joint.  She was having some tendinopathy on her prior evaluation, and I did a subacromial injection, which is helped somewhat. She was unable to complete a course of physical therapy due to pain.  Subjectively on my evaluation, she is actually considerably better today compared to her prior examination.  Tried Lyrica which helped a lot, but could not tolerate due to swelling. On high doses of Neurontin now.    Allergies: 1)  ! Amitriptyline Hcl 2)  ! Lyrica 3)  Biaxin 4)  Lipitor 5)  Sudafed 6)  * Cymbalta 7)  Levaquin  Past History:  Past medical, surgical, family and social histories (including risk factors) reviewed, and no changes noted (except as noted below).  Past Medical History: Reviewed history from 07/30/2008 and no changes required. Dizziness or vertigo- chronic  GERD Seizure disorder Urinary incontinence MS plantar fasciitis s/p sx allergic rhinitis back pain    cardilogy--Dr [facility] neurology-- Dr Sandria Manly   Past Surgical History: Reviewed history from 11/22/2007 and no changes required. Tubal ligation Colposcopy- CIN 1, HPV typing neg (07/1999) Cervical spine surgery (11/2002) GYN surgery Abd [place]- gallstones (02/2006) Cholecystectomy foot sx- plantar fasciitis- summer 08 (did not work per pt) 5/09 normal stress nuclear study 9/09 nl cxr  9/09 nl TS films for L sided back pain  Family History: Reviewed history from 07/30/2008 and no changes required. Father: CAD 6 bypass Mother: migraines, CAD - died of MI Siblings: 2 brothers, 1 sister cousin- CAD gm- CAD aunt- colon cancer 54s MGF- colon cancer 67s  Social History: Reviewed history from 07/30/2008 and no changes required. Marital Status: Married Children: 3 Occupation: disabled- from [place] smokes 1/2 to 1ppd  Alcohol Use - no Daily Caffeine Use-2  Review of Systems MS:  Complains of joint pain, mid back pain, muscle aches, muscle, cramps, stiffness, and thoracic pain; denies joint redness and joint swelling. Neuro:  Denies numbness and tingling.  Physical Exam  General:  overweight but generally well appearing - seems mildly fatigued  Head:  normocephalic, atraumatic, and no abnormalities observed.   Ears:  no external deformities.   Nose:  no external deformity.   Lungs:  normal respiratory effort.   Msk:  cervical spine: The patient does have limitation on flexion, extension, lateral bending, and rotation in all her motions with her cervical spine. She has lost at least  and 10-15 of motion in all directions. Spurling test is negative. Extremities:  No clubbing, cyanosis, edema, or deformity noted with normal full range of motion of all joints.     Shoulder/Elbow Exam  Shoulder Exam:    Right:    Inspection:  Normal    Palpation:  Normal    Stability:  stable    Tenderness:  no    Swelling:  no    Erythema:  no    Range of Motion:       Flexion-Active: 180        Extension-Active: 45       Flexion-Passive: 180       Extension-Passive: 45       External Rotation : 45       Interior Rotation : T7    Left:    Inspection:  Normal    Palpation:  Normal    Stability:  stable    Tenderness:  no    Swelling:  no    Erythema:  no    Range of Motion:       Flexion-Active: 180       Extension-Active: 45       Flexion-Passive: 180       Extension-Passive: 45       External Rotation : 45       Interior Rotation : T7  Impingement Sign NEER:    Right negative; Left negative Impingement Sign HAWKINS:    Right negative; Left negative Sulcus Sign:    Right negative; Left negative Yerguson:    Right negative; Left negative Speeds:    Right negative; Left negative    Impression & Recommendations:  Problem # 1:  NECK PAIN (ICD-723.1) Assessment Improved She has complaints today, but I think she is actually dramatically better than last time I saw her. She is having some mild periscapular spasm and some occasional upper trapezius pain.  I believe this is purely biomechanical, and I think that assisting with proper scapular stabilization is the primary treatment. She does have notable scapular dyskinesis on examination.  This could less likely be thoracic nerve impingement, however she is on appropriate neuropathic pain medications, and she is unable to tolerate Lyrica. She also has been intolerant to Elavil, Cymbalta in the past. I wouldn't add anything further at this point with this regard.  Her updated medication list for this problem includes:    Flexeril 10 Mg Tabs (Cyclobenzaprine hcl) .Marland Kitchen... Take one by mouth daily  Problem # 2:  ROTATOR CUFF SYNDROME, RIGHT (ICD-726.10) Assessment: Improved dramatically improved  Complete Medication List: 1)  Crestor 20 Mg Tabs (Rosuvastatin calcium) .... Take one half by mouth daily 2)  Prozac 10 Mg Caps (Fluoxetine hcl) .... Take one by mouth daily 3)  Flexeril 10 Mg Tabs (Cyclobenzaprine hcl) ....  Take one by mouth daily 4)  Clarinex 5 Mg Tabs (Desloratadine) .Marland Kitchen.. 1 by mouth once daily as needed 5)  Omeprazole 20 Mg Cpdr (Omeprazole) .Marland Kitchen.. 1 by mouth once daily in am 30 min before breakfast 6)  Flonase 50 Mcg/act Susp (Fluticasone propionate) .... 2 sprays in each nostril daily 7)  Nystatin 100000 Unit/gm Crea (Nystatin) .... Apply once daily to affected area 8)  Proventil Hfa 108 (90 Base) Mcg/act Aers (Albuterol sulfate) .... 2 puffs up to every 4 hours as needed wheezing 9)  Gabapentin 600 Mg Tabs (Gabapentin) .... Take one by mouth every 2 hours, total of 6 daily 10)  Beta Seron Shots  .... For  ms - dr love 11)  Moviprep 100 Gm Solr (Peg-kcl-nacl-nasulf-na asc-c) .... As per prep instructions.  Current Allergies (reviewed today): ! AMITRIPTYLINE HCL ! LYRICA BIAXIN LIPITOR SUDAFED * CYMBALTA LEVAQUIN

## 2010-04-09 NOTE — Assessment & Plan Note (Signed)
Summary: EAR, SINUS, THROAT & BLADDAR INFECTION / LFW  Medications Added LEVAQUIN 500 MG  TABS (LEVOFLOXACIN) 1 by mouth qd        Vital Signs:  Patient Profile:   55 Years Old Female Weight:      184 pounds Temp:     98.2 degrees F oral Pulse rate:   68 / minute Pulse rhythm:   regular BP sitting:   120 / 80  (right arm) Cuff size:   large  Vitals Entered By: Lowella Petties (February 07, 2007 2:57 PM)                 Chief Complaint:  Both ears hurting, sinus drainage, and ST. Also ? UTI.  History of Present Illness: has had uti since 2 weeks after last course of cipro- but has not been bad until recently odor to urine, with burning  both ears hurt sinuses are congested, full and painful- for 2 weeks nasal d/c is occ yellow no fever, but does have sore throat has cough that is productive in ams still smokes 1ppd  Current Allergies: BIAXIN LIPITOR SUDAFED * CYMBALTA  Past Surgical History:    Tubal ligation    Colposcopy- CIN 1, HPV typing neg (07/1999)    Cervical spine surgery (11/2002)    GYN surgery    Abd [place]- gallstones (02/2006)    Cholecystectomy    foot sx- plantar fasciitis- summer 08 (did not work per pt)   Social History:    Marital Status: Married    Children: 3    Occupation: disabled    smokes 1ppd    Review of Systems      See HPI  General      Complains of chills and fatigue.      Denies fever.  Eyes      Complains of eye irritation.      some crust from eyes in ams  ENT      Complains of ear discharge, earache, nasal congestion, postnasal drainage, sinus pressure, and sore throat.  Resp      Complains of cough, sputum productive, and wheezing.      some wheeze  GU      Complains of discharge, dysuria, and urinary frequency.      Denies hematuria.  MS      had foot sx- and now pain is worse  Derm      Denies rash.  Neuro      L sided headache lately-sees Dr Sandria Manly next week   Physical Exam  General:  fatigued appearing but well Head:     normocephalic, atraumatic, and no abnormalities observed.  diffuse eth and max sinus tenderness Eyes:     vision grossly intact, pupils equal, pupils round, and pupils reactive to light.  very mild conj injection without drainage Ears:     TMs dull without erythema Nose:     mucosal erythema and mucosal edema.   Mouth:     pharynx pink and moist.   Neck:     No deformities, masses, or tenderness noted. Lungs:     diffusely distant bs no wheeze, rales, or crackles Heart:     Normal rate and regular rhythm. S1 and S2 normal without gallop, murmur, click, rub or other extra sounds. Abdomen:     mild suprapubic tenderness without fullness soft, normal bowel sounds, no hepatomegaly, and no splenomegaly.   Msk:     mild R CVA tenderness Pulses:  R and L carotid,radial,femoral,dorsalis pedis and posterior tibial pulses are full and equal bilaterally Extremities:     No clubbing, cyanosis, edema, or deformity noted with normal full range of motion of all joints.   Skin:     Intact without suspicious lesions or rashes Cervical Nodes:     No lymphadenopathy noted Psych:     nl affect    Impression & Recommendations:  Problem # 1:  SINUSITIS- ACUTE-NOS (ICD-461.9) will tx with levaquin and sympt care for congestion update if worse or no imp in 1 week The following medications were removed from the medication list:    Cipro 250 Mg Tabs (Ciprofloxacin hcl) .Marland Kitchen... 1 by mouth two times a day for 5 days  Her updated medication list for this problem includes:    Levaquin 500 Mg Tabs (Levofloxacin) .Marland Kitchen... 1 by mouth qd   Problem # 2:  UTI (ICD-599.0) will tx with levaquin- pend cx result lots of water- update if worse or back pain or nausea The following medications were removed from the medication list:    Cipro 250 Mg Tabs (Ciprofloxacin hcl) .Marland Kitchen... 1 by mouth two times a day for 5 days  Her updated medication list for this problem  includes:    Levaquin 500 Mg Tabs (Levofloxacin) .Marland Kitchen... 1 by mouth qd  Orders: T-Culture, Urine ([address]-60454) UA Dipstick W/ Micro (81000)   Problem # 3:  TOBACCO USE (ICD-305.1) disc smoking cessation and risks in detail pt is still not interested in quitting quite yet  Complete Medication List: 1)  Neurontin 600 Mg Tabs (Gabapentin) .... Take one by mouth tid 2)  Crestor 20 Mg Tabs (Rosuvastatin calcium) .... Take one half by mouth daily 3)  Prozac 10 Mg Caps (Fluoxetine hcl) .... Take one by mouth daily 4)  Flexeril 10 Mg Tabs (Cyclobenzaprine hcl) .... Take one by mouth daily 5)  Betaseron 0.3 Mg Solr (Interferon beta-1b) .... Injections as directed 6)  Hydrochlorothiazide 12.5 Mg Caps (Hydrochlorothiazide) .... Take one by mouth q am 7)  Levaquin 500 Mg Tabs (Levofloxacin) .Marland Kitchen.. 1 by mouth qd   Patient Instructions: 1)  we will let you know when urine culture returns 2)  continue drinking lots of water 3)  call or seek care is symptoms don't improve in 2-3 days or if you develop back pain, nausea, or vomiting 4)  you can try mucinex over the counter twice daily as directed and nasal saline spray for congestion 5)  tylenol over the counter as directed may help with aches, headache and fever 6)  call if symptoms worsen or if not improved in 4-5 days 7)  take the levaquin as directed for sinus and bladder infections    Prescriptions: LEVAQUIN 500 MG  TABS (LEVOFLOXACIN) 1 by mouth qd  #10 x 0   Entered and Authorized by:   Judith Part MD   Signed by:   Judith Part MD on 02/07/2007   Method used:   Print then Give to Patient   RxID:   [id number]  ] Laboratory Results   Urine Tests  Date/Time Recieved: February 07, 2007 3:05 PM  Date/Time Reported: February 07, 2007 3:05 PM   Routine Urinalysis   Color: yellow Appearance: Hazy Glucose: negative   (Normal Range: Negative) Bilirubin: negative   (Normal Range: Negative) Ketone: negative   (Normal Range:  Negative) Spec. Gravity: 1.010   (Normal Range: 1.003-1.035) Blood: negative   (Normal Range: Negative) pH: 6.0   (Normal Range: 5.0-8.0)  Protein: negative   (Normal Range: Negative) Urobilinogen: 0.2   (Normal Range: 0-1) Nitrite: positive   (Normal Range: Negative) Leukocyte Esterace: negative   (Normal Range: Negative)  Urine Microscopic WBC/hpf: 2-5 RBC/hpf: 1-2 Bacteria: many Mucous: few Epithelial: 1 Crystals/LPF: 0 Casts/LPF: 0 Yeast/HPF: 0    Comments: Strong odor

## 2010-04-09 NOTE — Assessment & Plan Note (Signed)
Summary: cold/mk   Vital Signs:  Patient profile:   55 year old female Height:      61.5 inches Weight:      191.50 pounds BMI:     35.73 Temp:     97.8 degrees F oral Pulse rate:   64 / minute Pulse rhythm:   regular Resp:     24 per minute BP sitting:   116 / 76  (left arm) Cuff size:   large  Vitals Entered By: Lewanda Rife LPN (February 03, 2009 12:04 PM)  History of Present Illness: is really sick with a bad cold  started on 11/23 started with a sore throat and drainage  went into chest -- with bad prod cough  nasal congestion -- all mucous is yellow and "strangles her " - with sme clear  is wheezing some  roof of her mouth is sore  some fever on and off - thinks this is low grade  not a lot of aches or chills some hot/ sweats   some otc meds-- tussin for chest congestion  using nasal spray  cut down her smoking  Allergies: 1)  ! Amitriptyline Hcl 2)  ! 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 Dr. Donalee Citrin. 2004 TUBAL LIGATION, HX OF (ICD-V26.51) DILATION AND CURETTAGE, HX OF (ICD-V45.89) TONSILLECTOMY, HX OF (ICD-V45.79) FOOT SURGERY, HX OF (ICD-V15.89) CHOLECYSTECTOMY, HX OF (ICD-V45.79) 9/10 chest CT with small 4mm nodule L lung base (rec re check in 1 yr)  6/10 colonosco- polyps /- re check 5y 6/10 EGD- erosive gastritis , h pylori (treated )  Family History: Last updated: 07/30/2008 Father: CAD 6 bypass Mother: migraines, CAD - died of MI Siblings: 2 brothers, 1 sister cousin- CAD gm- CAD aunt-  colon cancer 75s MGF- colon cancer 45s  Social History: Last updated: 07/30/2008 Marital Status: Married Children: 3 Occupation: disabled- from MS smokes 1/2 to 1ppd  Alcohol Use - no Daily Caffeine Use-2  Risk Factors: Smoking Status: quit (09/30/2006)  Review of Systems General:  Complains of chills, fatigue, fever, loss of appetite, and malaise. Eyes:  Denies blurring, discharge, and eye irritation. ENT:  Complains of hoarseness, nasal congestion, postnasal drainage, sinus pressure, and sore throat; denies ear discharge and earache. CV:  Denies chest pain or discomfort and palpitations. Resp:  Complains of cough and sputum productive; denies wheezing. GI:  Denies abdominal pain, nausea, and vomiting. Derm:  Denies lesion(s) and rash.  Physical Exam  General:  overweight but generally well appearing - seems mildly fatigued  Head:  normocephalic, atraumatic, and no abnormalities observed.  no sinus tenderness  Eyes:  vision grossly intact, pupils equal, pupils round, pupils reactive to light, and no injection.   Ears:  R ear normal and L ear normal.   Nose:  nares are congested and injected  Mouth:  pharynx pink and moist, no erythema, and no exudates.   Neck:  supple with full rom and no masses or thyromegally, no JVD or carotid bruit  Chest Wall:  No deformities, masses, or tenderness noted. Lungs:  CTA with diffusely distant bs  harsh bs  and diffuse exp wheeze heard throughout  no rales or crackles  no labored breathing   Heart:  Normal rate and regular rhythm. S1 and S2 normal without gallop, murmur, click, rub or other extra sounds. Skin:  Intact without suspicious lesions or rashes Cervical Nodes:  No lymphadenopathy noted Psych:  nl affect    Impression & Recommendations:  Problem # 1:  BRONCHITIS- ACUTE (ICD-466.0) Assessment New with reactive airways and prod cough in smoker will cover with augmentin and update  guif- cod cough susp with caution proventil  mdi as needed  pred taper 30 mg - disc side eff pt advised to update me if symptoms worsen or do not improve - esp if inc cough or sob  adv to quit smoking Her updated medication list for this problem includes:    Proventil Hfa 108 (90 Base) Mcg/act Aers (Albuterol sulfate) .Marland Kitchen... 2 puffs up to every 4 hours as needed wheezing    Robitussin Chest Congestion 100 Mg/101ml Syrp (Guaifenesin) ..... Otc as directed.    Augmentin 875-125 Mg Tabs (Amoxicillin-pot clavulanate) .Marland Kitchen... 1 by mouth two times a day for 10 days    Guaifenesin-codeine 100-10 Mg/25ml Syrp (Guaifenesin-codeine) .Marland Kitchen... 1-2 teaspoon by mouth up to every 6 hours as needed cough caution of sedation  Problem # 2:  TOBACCO USE (ICD-305.1) Assessment: Unchanged discussed in detail risks of smoking, and possible outcomes including COPD, vascular dz, cancer and also respiratory infections/sinus problems  -- pt voiced understanding and is not yet ready to quit  disc this for 2-3 minutes today adv to come back for flu shot and pneumovax when she is over this uri  Complete Medication List: 1)  Crestor 20 Mg Tabs (Rosuvastatin calcium) .... Take one half by mouth daily 2)  Prozac 10 Mg Caps (Fluoxetine hcl) .... Take one by mouth daily a needed 3)  Flexeril 10 Mg Tabs (Cyclobenzaprine hcl) .... Take one by mouth in am and 2 tabs in pm 4)  Clarinex 5 Mg Tabs (Desloratadine) .Marland Kitchen.. 1 by mouth once daily as needed 5)  Omeprazole 20 Mg Cpdr (Omeprazole) .Marland Kitchen.. 1 by mouth two times a day 6)  Flonase 50 Mcg/act Susp (Fluticasone propionate) .... 2 sprays in each nostril daily 7)  Nystatin 100000 Unit/gm Crea (Nystatin) .... Apply once daily to affected area as needed 8)  Proventil Hfa 108 (90 Base) Mcg/act Aers (Albuterol sulfate) .... 2 puffs up to every 4 hours as needed wheezing 9)  Gabapentin 600 Mg Tabs (Gabapentin) .... Take one by mouth every 2 hours, total of 6 daily on hold 10)  Beta Seron Shots  .... For ms - dr love 11)  Robinul-forte 2 Mg  Tabs (Glycopyrrolate) .Marland Kitchen.. 1 tablet twice a day 12)  Align Caps (Probiotic product) .... Take 1 tab samples given 13)  Analpram E 2.5-1 & 1 % Kit (Hydrocortisone ace-pramoxine) .... Apply two times a day to rectum as needed 14)  Anusol-hc 25 Mg Supp (Hydrocortisone acetate) .... Use rectally two times a day as needed 15)  Lyrica 75 Mg Caps (Pregabalin) .Marland Kitchen.. 1 tab by mouth every am and 2 tabs by mouth at bedtime 16)  Robitussin Chest Congestion 100 Mg/44ml Syrp (Guaifenesin) .... Otc as directed. 17)  Augmentin 875-125 Mg Tabs (Amoxicillin-pot clavulanate) .Marland Kitchen.. 1 by mouth two times a day for 10 days 18)  Prednisone 10 Mg Tabs (Prednisone) .... Take by mouth as directed 19)  Guaifenesin-codeine 100-10 Mg/55ml Syrp (Guaifenesin-codeine) .Marland Kitchen.. 1-2 teaspoon by mouth up to every 6 hours  as needed cough caution of sedation  Patient Instructions: 1)  take augmentin as directed for bronchitis  2)  guifenesin cough syrup has codiene in it - caution of sedation  3)  use inhaler as needed  4)  take PREDNISONE as follows 5)  3 pills once daily for 3 days and then 6)  2 pills once daily for 3 days and then 7)  1 pill once daily for 3 days and then stop  8)  tylenol over the counter as directed may help with aches, headache and fever 9)  call if symptoms worsen or if not improved in 4-5 days  10)  update me if worse or more wheezy Prescriptions: PROVENTIL HFA 108 (90 BASE) MCG/ACT AERS (ALBUTEROL SULFATE) 2 puffs up to every 4 hours as needed wheezing  #1 mdi x 3   Entered and Authorized by:   Judith Part MD   Signed by:   Judith Part MD on 02/03/2009   Method used:   Print then Give to Patient   RxID:   [id number] GUAIFENESIN-CODEINE 100-10 MG/5ML SYRP (GUAIFENESIN-CODEINE) 1-2 teaspoon by mouth up to every 6 hours as needed cough caution of sedation  #120cc x 0   Entered and Authorized by:   Judith Part MD   Signed by:   Judith Part MD on 02/03/2009   Method used:   Print then  Give to Patient   RxID:   [id number] PREDNISONE 10 MG TABS (PREDNISONE) take by mouth as directed  #18 x 0   Entered and Authorized by:   Judith Part MD   Signed by:   Judith Part MD on 02/03/2009   Method used:   Print then Give to Patient   RxID:   [id number] AUGMENTIN 875-125 MG TABS (AMOXICILLIN-POT CLAVULANATE) 1 by mouth two times a day for 10 days  #20 x 0   Entered and Authorized by:   Judith Part MD   Signed by:   Judith Part MD on 02/03/2009   Method used:   Print then Give to Patient   RxID:   [id number]   Current Allergies (reviewed today): ! AMITRIPTYLINE HCL ! LYRICA BIAXIN LIPITOR SUDAFED * CYMBALTA LEVAQUIN

## 2010-04-09 NOTE — Letter (Signed)
Summary: Patient [facility] Biopsy Results  [facility]  [address] [place], [place] [address]   Phone: [phone]  Fax: [phone]        August 21, 2008 MRN: [id number]    [facility] [address] [place], [place]  [address]    Dear Ms. Bushway,  I am pleased to inform you that the biopsies taken during your recent endoscopic examination did not show any evidence of cancer upon pathologic examination. The biopies showed gastritis with H. pylori infection. Please complete the antibiotics prescribed.  Continue with the treatment plan as outlined on the day of your      exam.  Please call us if you are having persistent problems or have questions about your condition that have not been fully answered at this time.  Sincerely,  Meryl Dare MD [facility]  This letter has been electronically signed by your physician.

## 2010-04-09 NOTE — Assessment & Plan Note (Signed)
Summary: 6 WEEK FOLLOW UP/[facility]   Vital Signs:  Patient profile:   55 year old female Height:      61.5 inches Weight:      173.50 pounds BMI:     32.37 Temp:     98.2 degrees F oral Pulse rate:   72 / minute Pulse rhythm:   regular BP sitting:   110 / 84  (left arm) Cuff size:   large  Vitals Entered By: Lewanda Rife LPN (October 13, 2009 3:03 PM) CC: six week f/u   History of Present Illness: here for f/u of anxiety   wt is down 7 lb  saw counselor Evalina Field-- and was recommended to adv med went up to zoloft 100--on 7/27 is feeling a lot better - even got out of the house this weekend   never got suidcidal -- just wanted to isolate herself and lost all motivation  no side effects  a little light headed -- bp tends to be low -- when she stands up quickly  some hand tremor -- otherwise the MS is the same  always worse in the ams    Allergies: 1)  ! Amitriptyline Hcl 2)  ! Lyrica 3)  Biaxin 4)  Lipitor 5)  Sudafed 6)  * Cymbalta 7)  Levaquin  Past History:  Past Surgical History: Last updated: 07/22/2009 ANTERIOR CERVICAL DISKECTOMY and fusion, c4-5, c5-6, c6-7. Dr. Donalee Citrin. 2004 TUBAL LIGATION, HX OF (ICD-V26.51) DILATION AND CURETTAGE, HX OF (ICD-V45.89) TONSILLECTOMY, HX OF (ICD-V45.79) FOOT SURGERY, HX OF (ICD-V15.89) CHOLECYSTECTOMY, HX OF (ICD-V45.79) 9/10 chest CT with small 4mm nodule L lung base (rec re check in 1 yr) 5/11 re check chest CT- lung nodule stable 12/10 sinus CT negative  6/10 colonosco- polyps /- re check 5y 6/10 EGD- erosive gastritis , h pylori (treated )  Family History: Last updated: 07/30/2008 Father: CAD 6 bypass Mother: migraines, CAD - died of MI Siblings: 2 brothers, 1 sister cousin- CAD gm- CAD aunt- colon cancer 12s MGF- colon cancer 49s  Social History: Last updated: 07/30/2008 Marital Status: Married Children: 3 Occupation: disabled- from MS smokes 1/2 to 1ppd  Alcohol Use - no Daily Caffeine  Use-2  Risk Factors: Smoking Status: quit (09/30/2006)  Past Medical History: Dizziness or vertigo- chronic  GERD Seizure disorder Urinary incontinence MS plantar fasciitis s/p sx allergic rhinitis ADENOMATOUS COLON POLYPS 08/2008 GASTRITIS /H.PYLORI +08/2008 lung nodule  menopausal disorder anxiety  counselor -Evalina Field  cardilogy--Dr [facility] neurology-- Dr Sandria Manly   Review of Systems General:  Denies fatigue, fever, loss of appetite, and malaise. Eyes:  Denies blurring and eye irritation. CV:  Denies chest pain or discomfort, lightheadness, palpitations, and shortness of breath with exertion. Resp:  Denies cough, shortness of breath, and wheezing. GI:  Denies abdominal pain, change in bowel habits, indigestion, and nausea. MS:  Complains of muscle aches, muscle weakness, and stiffness; denies joint pain and cramps. Derm:  Denies itching, lesion(s), poor wound healing, and rash. Neuro:  Complains of numbness and tingling; denies memory loss. Psych:  Complains of anxiety and depression; denies suicidal thoughts/plans. Endo:  Denies cold intolerance, excessive thirst, excessive urination, and heat intolerance. Heme:  Denies abnormal bruising and bleeding.  Physical Exam  General:  overweight but generally well appearing - seems less fatigued today Head:  normocephalic, atraumatic, and no abnormalities observed.   Eyes:  vision grossly intact, pupils equal, pupils round, and pupils reactive to light.  no conjunctival pallor, injection or icterus  Mouth:  pharynx pink and moist.   Neck:  supple with full rom and no masses or thyromegally, no JVD or carotid bruit  Chest Wall:  No deformities, masses, or tenderness noted. Lungs:  CTA with diffusely distant bs no wheeze today no rales or rhonchi Heart:  Normal rate and regular rhythm. S1 and S2 normal without gallop, murmur, click, rub or other extra sounds. Abdomen:  soft, non-tender, and normal bowel sounds.  no renal  bruits  Msk:  No deformity or scoliosis noted of thoracic or lumbar spine.   Pulses:  R and L carotid,radial,femoral,dorsalis pedis and posterior tibial pulses are full and equal bilaterally Extremities:  no CCE Neurologic:  sensation intact to light touch, gait normal, and DTRs symmetrical and normal.  no tremor at this moment Skin:  Intact without suspicious lesions or rashes Cervical Nodes:  No lymphadenopathy noted Inguinal Nodes:  No significant adenopathy Psych:  much improved affect- smiling and not tearful good insight and eye contact   Impression & Recommendations:  Problem # 1:  ANXIETY, SITUATIONAL (ICD-308.3) Assessment Improved this is much improved (also dep) with zoloft 100 and counseling will continue both  disc situational stress- is about the same but generally handling it better now  commended on getting out more! f/u in 6 mo  Problem # 2:  HYPERGLYCEMIA, BORDERLINE (ICD-790.29) Assessment: Unchanged check sugar with labs today disc healthy diet (low simple sugar/ choose complex carbs/ low sat fat) diet and exercise in detail  Orders: Venipuncture (16109) TLB-Lipid Panel (80061-LIPID) TLB-Renal Function Panel (80069-RENAL) TLB-ALT (SGPT) (84460-ALT) TLB-AST (SGOT) (84450-SGOT) T-Vitamin D (25-Hydroxy) (60454-09811)  Problem # 3:  UNSPECIFIED VITAMIN D DEFICIENCY (ICD-268.9) Assessment: Unchanged check this with labs today rev ca and D intake Orders: Venipuncture (91478) TLB-Lipid Panel (80061-LIPID) TLB-Renal Function Panel (80069-RENAL) TLB-ALT (SGPT) (84460-ALT) TLB-AST (SGOT) (84450-SGOT) T-Vitamin D (25-Hydroxy) (29562-13086) Specimen Handling (57846)  Problem # 4:  Hx of HYPERCHOLESTEROLEMIA (ICD-272.0) Assessment: Unchanged  on crestor and low sat fat diet with some wt loss lab today and update  f/u 6 mo  rev low sat fat diet  Her updated medication list for this problem includes:    Crestor 20 Mg Tabs (Rosuvastatin calcium) .Marland Kitchen... Take  one half by mouth daily  Orders: Venipuncture (96295) TLB-Lipid Panel (80061-LIPID) TLB-Renal Function Panel (80069-RENAL) TLB-ALT (SGPT) (84460-ALT) TLB-AST (SGOT) (84450-SGOT) T-Vitamin D (25-Hydroxy) (28413-24401)  Labs Reviewed: SGOT: 21 (07/23/2008)   SGPT: 15 (07/23/2008)   HDL:50.60 (07/23/2008), 41.6 (07/27/2007)  LDL:89 (07/23/2008), 83 (07/27/2007)  Chol:155 (07/23/2008), 141 (07/27/2007)  Trig:78.0 (07/23/2008), 82 (07/27/2007)  Complete Medication List: 1)  Crestor 20 Mg Tabs (Rosuvastatin calcium) .... Take one half by mouth daily 2)  Flexeril 10 Mg Tabs (Cyclobenzaprine hcl) .... Take one by mouth in am and 2 tabs in pm 3)  Clarinex 5 Mg Tabs (Desloratadine) .Marland Kitchen.. 1 by mouth once daily as needed 4)  Omeprazole 20 Mg Cpdr (Omeprazole) .Marland Kitchen.. 1 by mouth two times a day 5)  Flonase 50 Mcg/act Susp (Fluticasone propionate) .... 2 sprays in each nostril daily as needed 6)  Nystatin 100000 Unit/gm Crea (Nystatin) .... Apply once daily to affected area as needed 7)  Proventil Hfa 108 (90 Base) Mcg/act Aers (Albuterol sulfate) .... 2 puffs up to every 4 hours as needed wheezing 8)  Gabapentin 600 Mg Tabs (Gabapentin) .... Take one by mouth every 2 hours, total of 6 daily 9)  Beta Seron Shots  .... For ms - dr love 10)  Robinul-forte 2 Mg Tabs (Glycopyrrolate) .Marland Kitchen.. 1 tablet  twice a day 11)  Align Caps (Probiotic product) .... Take 1 tab samples given 12)  Analpram E 2.5-1 & 1 % Kit (Hydrocortisone ace-pramoxine) .... Apply two times a day to rectum as needed 13)  Anusol-hc 25 Mg Supp (Hydrocortisone acetate) .... Use rectally two times a day as needed 14)  Robitussin Chest Congestion 100 Mg/41ml Syrp (Guaifenesin) .... Otc as directed. 15)  Guaifenesin-codeine 100-10 Mg/86ml Syrp (Guaifenesin-codeine) .Marland Kitchen.. 1-2 teaspoon by mouth up to every 6 hours as needed cough caution of sedation 16)  Zoloft 100 Mg Tabs (Sertraline hcl) .Marland Kitchen.. 1 by mouth once daily 17)  Alprazolam 0.5 Mg Tabs  (Alprazolam) .Marland Kitchen.. 1 by mouth up to two times a day as needed severe anxiety - only use if necessary  Patient Instructions: 1)  continue the 100 mg dose of zoloft-I think it is helping more 2)  continue your counseling  3)  keep working on getting out / and staying as active as you can be  4)  labs today for cholesterol and vit d and renal function 5)  follow up in about 6 months   Current Allergies (reviewed today): ! AMITRIPTYLINE HCL ! LYRICA BIAXIN LIPITOR SUDAFED * CYMBALTA LEVAQUIN

## 2010-04-09 NOTE — Assessment & Plan Note (Signed)
Summary: STOMACH & KIDNEY PROBLEMS/BIR   Vital Signs:  Patient profile:   55 year old female Height:      61.5 inches Weight:      182 pounds BMI:     33.95 Temp:     98.0 degrees F oral Pulse rate:   72 / minute Pulse rhythm:   regular BP sitting:   106 / 70  (left arm) Cuff size:   large  Vitals Entered By: Sydell Axon (Jul 23, 2008 12:25 PM)  History of Present Illness: pain in L flank area - like someone punched her  stomach is bloated and has diarrhea all the time  no blood in stool  tight and bloated in lower abd , no cramps  when she eats - urgency to have BM usually 2-3 bm per day -- worse some days than others occ goes 1 week between bms  rectum itches but does not hurt  does eat vegetables and lots of fiber  drinks plenty of water   some frequency of urination - has to go all the time  stays thirsty a lot   has had few episodes of vomiting - not often , her denture gags her  some nausea  often has indigestion - even though she takes omeprazole daily   skin is more dry lately   never had colonoscopy or endoscopy in past    is eating less and less and stomach keeps getting bigger and bigger  back looks darker/ tanner on R side - not tanning or out in the sun   was off MS med for a while and felt better - then Dr Sandria Manly started her back   sometimes has nasty smell in nose - stays congested on L side  no fever   unable to quit smoking- but has the patches  has cut down a bit     Allergies: 1)  ! Amitriptyline Hcl 2)  ! * ? Peanut Butter 3)  ! Lyrica 4)  Biaxin 5)  Lipitor 6)  Sudafed 7)  * Cymbalta 8)  Levaquin  Family History:    Father: CAD 6 bypass    Mother: migraines, CAD - died of MI    Siblings: 2 brothers, 1 sister    cousin- CAD    gm- CAD    aunt- colon ca    MGF- colon cancer   Review of Systems General:  Complains of fatigue and loss of appetite; denies chills and fever. Eyes:  Denies blurring and eye irritation. ENT:   Complains of nasal congestion and nosebleeds; denies earache, sinus pressure, and sore throat. CV:  Denies chest pain or discomfort, palpitations, shortness of breath with exertion, and swelling of feet. Resp:  Denies cough and wheezing. GI:  Complains of gas; denies bloody stools and vomiting. GU:  Complains of incontinence and urinary frequency; denies dysuria, hematuria, and urinary hesitancy; has incontinence - decided against surgery, deals with it ok . Derm:  Denies poor wound healing and rash. Neuro:  Complains of poor balance and weakness; from Ms- fairly stable . Psych:  generally very stressed , caring for her ill father . Endo:  Complains of excessive thirst and excessive urination.  Physical Exam  General:  overweight but generally well appearing - seems mildly fatigued  Head:  normocephalic, atraumatic, and no abnormalities observed.  no sinus tenderness  Eyes:  vision grossly intact, pupils equal, pupils round, and pupils reactive to light.  no conjunctival pallor, injection or icterus  Ears:  R ear normal and L ear normal.   Nose:  nares injected and boggy- worse on L with septal deviation Mouth:  pharynx pink and moist, no erythema, and no exudates.   Neck:  supple with full rom and no masses or thyromegally, no JVD or carotid bruit  Chest Wall:  No deformities, masses, or tenderness noted. Lungs:  diffusely distant bs, no wheeze or crackles Heart:  Normal rate and regular rhythm. S1 and S2 normal without gallop, murmur, click, rub or other extra sounds. Abdomen:  tender diff in all areas no rebound or gaurding  soft, normal bowel sounds, no distention, no masses, no hepatomegaly, and no splenomegaly.   Msk:  No deformity or scoliosis noted of thoracic or lumbar spine.   some tenderness over TS Extremities:  no CCE Neurologic:  sensation intact to light touch, gait normal, and DTRs symmetrical and normal.  no tremor  Skin:  somewhat tanned , some areas of  hyperpigmentation on back with lentigos Cervical Nodes:  No lymphadenopathy noted Inguinal Nodes:  No significant adenopathy Psych:  seems stressed/fatigued good historian   Impression & Recommendations:  Problem # 1:  FREQUENCY, URINARY (ICD-788.41) Assessment Deteriorated neg ua and no voiding symptoms  stress incontinence is stable - pt does not want tx at this time  suspect early DM - check AIC  disc good water intake Orders: TLB-A1C / Hgb A1C (Glycohemoglobin) (83036-A1C) UA Dipstick W/ Micro (manual) (04540)  Problem # 2:  UNSPECIFIED VITAMIN D DEFICIENCY (ICD-268.9) Assessment: New dx by neurology- pt has taken ? 1200 international units daily check level and advise Orders: T-Vitamin D (25-Hydroxy) [phone])  Problem # 3:  DIARRHEA (ICD-787.91) Assessment: New ongoing with urgency of stools after eating and bloating  IBS in differential , but pt also has remote fam hx of colon cancer  adv to start citrucel daily ref to GI for this and reflux Orders: Gastroenterology Referral (GI) Venipuncture (95621) TLB-Renal Function Panel (80069-RENAL) TLB-CBC Platelet - w/Differential (85025-CBCD) TLB-A1C / Hgb A1C (Glycohemoglobin) (83036-A1C)  Problem # 4:  ABDOMINAL PAIN, GENERALIZED (ICD-789.07) Assessment: New see above - in conj with stool changes GI ref done  Orders: Gastroenterology Referral (GI)  Problem # 5:  ROTATOR CUFF SYNDROME, RIGHT (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 Take one half by mouth daily  Orders: TLB-Lipid Panel (80061-LIPID) TLB-ALT (SGPT) (84460-ALT) TLB-AST (SGOT) (84450-SGOT)  Labs Reviewed: SGOT: 24  (07/27/2007)   SGPT: 24 (07/27/2007)   HDL:41.6 (07/27/2007)  LDL:83 (07/27/2007)  Chol:141 (07/27/2007)  Trig:82 (07/27/2007)  Problem # 7:  TOBACCO USE (ICD-305.1) Assessment: Unchanged this may be worsening chronic congestion discussed in detail risks of smoking, and possible outcomes including COPD, vascular dz, cancer and also respiratory infections/sinus problems  disc plan to use patches to quit   Complete Medication List: 1)  Crestor 20 Mg Tabs (Rosuvastatin calcium) .... Take one half by mouth daily 2)  Prozac 10 Mg Caps (Fluoxetine hcl) .... Take one by mouth daily 3)  Flexeril 10 Mg Tabs (Cyclobenzaprine hcl) .... Take one by mouth daily 4)  Clarinex 5 Mg Tabs (Desloratadine) .Marland Kitchen.. 1 by mouth once daily as needed 5)  Omeprazole 20 Mg Cpdr (  Omeprazole) .Marland Kitchen.. 1 by mouth once daily in am 30 min before breakfast 6)  Flonase 50 Mcg/act Susp (Fluticasone propionate) .... 2 sprays in each nostril daily 7)  Nystatin 100000 Unit/gm Crea (Nystatin) .... Apply once daily to affected area 8)  Vicodin 5-500 Mg Tabs (Hydrocodone-acetaminophen) .Marland Kitchen.. 1 by mouth q 6 hours as needed severe pain 9)  Proventil Hfa 108 (90 Base) Mcg/act Aers (Albuterol sulfate) .... 2 puffs up to every 4 hours as needed wheezing 10)  Gabapentin 600 Mg Tabs (Gabapentin) .... Take one by mouth every 2 hours, total of 6 daily 11)  Beta Seron Shots  .... For ms - dr love  Patient Instructions: 1)  schedule f/u with Dr Dallas Schimke for shoulder pain  2)  labs today - checking sugar with frequent urination  3)  start citrucel (fiber supplement over the counter) with water once daily  4)  start eating 1 small serving of yogurt daily  5)  watch what you eat with reflux - and update me if heartburn/ indigestion/ stomach pain happen more frequenly than once per week  6)  we will ref to GI doctor at check out     Current Allergies (reviewed today): ! AMITRIPTYLINE HCL ! * ? PEANUT BUTTER ! LYRICA BIAXIN LIPITOR SUDAFED *  CYMBALTA LEVAQUIN  Laboratory Results   Urine Tests  Date/Time Received: Jul 23, 2008 12:34 PM  Date/Time Reported: Jul 23, 2008 12:34 PM   Routine Urinalysis   Color: yellow Appearance: Clear Glucose: negative   (Normal Range: Negative) Bilirubin: negative   (Normal Range: Negative) Ketone: negative   (Normal Range: Negative) Spec. Gravity: 1.015   (Normal Range: 1.003-1.035) Blood: negative   (Normal Range: Negative) pH: 6.0   (Normal Range: 5.0-8.0) Protein: negative   (Normal Range: Negative) Urobilinogen: 0.2   (Normal Range: 0-1) Nitrite: negative   (Normal Range: Negative) Leukocyte Esterace: negative   (Normal Range: Negative)  Urine Microscopic WBC/HPF: 0 RBC/HPF: 0 Bacteria/HPF: 0 Mucous/HPF: few Epithelial/HPF: 1 Crystals/HPF: 0 Casts/LPF: 0 Yeast/HPF: 0 Other: 0

## 2010-04-09 NOTE — Assessment & Plan Note (Signed)
Summary: 3 month follow up /lsf   Vital Signs:  Patient profile:   55 year old female Weight:      189 pounds BMI:     35.26 Temp:     98 degrees F oral Pulse rate:   72 / minute Pulse rhythm:   regular BP sitting:   122 / 90  (left arm) Cuff size:   large  Vitals Entered By: Lowella Petties CMA (February 12, 2009 11:06 AM) CC: 3 month follow up, also, URI is not any better.   History of Present Illness: is here still with uri symptoms  splitting headache and feels weak in general  congestion and runny nose  cough -- is couging stuff up with color  no st and ears feel ok   headache is over eyes  nasal d/c is clear  still taking cough med over the counter and amoxicillin and just finished prednisone    is having sweats --- from the prednisone    still smoking - -- has cut way down to 1-2 day      Allergies: 1)  ! Amitriptyline Hcl 2)  ! Lyrica 3)  Biaxin 4)  Lipitor 5)  Sudafed 6)  * Cymbalta 7)  Levaquin  Past History:  Past Medical History: Last updated: 11/12/2008 Dizziness or vertigo- chronic  GERD Seizure disorder Urinary incontinence MS plantar fasciitis s/p sx allergic rhinitis ADENOMATOUS COLON POLYPS 08/2008 GASTRITIS /H.PYLORI +08/2008 lung nodule   cardilogy--Dr [facility] neurology-- Dr Sandria Manly   Past Surgical History: Last updated: 12/27/2008 ANTERIOR CERVICAL DISKECTOMY and fusion, c4-5, c5-6, c6-7. Dr. Donalee Citrin. 2004 TUBAL LIGATION, HX OF (ICD-V26.51) DILATION AND CURETTAGE, HX OF (ICD-V45.89) TONSILLECTOMY, HX OF (ICD-V45.79) FOOT SURGERY, HX OF (ICD-V15.89) CHOLECYSTECTOMY, HX OF (ICD-V45.79) 9/10 chest CT with small 4mm nodule L lung base (rec re check in 1 yr)  6/10 colonosco- polyps /- re check 5y 6/10 EGD- erosive gastritis , h pylori (treated )  Family History: Last updated: 07/30/2008 Father: CAD 6 bypass Mother: migraines, CAD - died of MI Siblings: 2 brothers, 1 sister cousin- CAD gm- CAD aunt- colon cancer  32s MGF- colon cancer 32s  Social History: Last updated: 07/30/2008 Marital Status: Married Children: 3 Occupation: disabled- from MS smokes 1/2 to 1ppd  Alcohol Use - no Daily Caffeine Use-2  Risk Factors: Smoking Status: quit (09/30/2006)  Review of Systems General:  Complains of chills, fatigue, fever, loss of appetite, and malaise. Eyes:  Complains of discharge and eye irritation; denies blurring. ENT:  Complains of nasal congestion, nosebleeds, postnasal drainage, and sinus pressure; denies ear discharge. CV:  Denies chest pain or discomfort, palpitations, and shortness of breath with exertion. Resp:  Complains of cough and wheezing; denies shortness of breath and sputum productive; the wheezing has improved. GI:  Denies abdominal pain, change in bowel habits, nausea, and vomiting. MS:  Complains of muscle aches. Derm:  Denies lesion(s) and rash.  Physical Exam  General:  overweight but generally well appearing - seems mildly fatigued  Head:  normocephalic, atraumatic, and no abnormalities observed.  bilat ethmoid and maxillary sinus tenderness  Eyes:  vision grossly intact, pupils equal, pupils round, and pupils reactive to light.  mild conj injection  Ears:  R ear normal and L ear normal.   Nose:  nares are congested and injected -wors on R Mouth:  pharynx pink and moist, no erythema, and no exudates.  some clear post nasal drip Neck:  supple with full rom and no masses or thyromegally,  no JVD or carotid bruit  Lungs:  CTA with diffusely distant bs no wheeze today no rales or rhonchi Heart:  Normal rate and regular rhythm. S1 and S2 normal without gallop, murmur, click, rub or other extra sounds. Neurologic:  cranial nerves II-XII intact, strength normal in all extremities, sensation intact to light touch, gait normal, and DTRs symmetrical and normal.   Skin:  Intact without suspicious lesions or rashes Cervical Nodes:  No lymphadenopathy noted Psych:  fatigued but nl  affect   Impression & Recommendations:  Problem # 1:  SINUSITIS - ACUTE-NOS (ICD-461.9) Assessment Deteriorated not improved after course of abx / augmentin and steroids has made imp with smoking -enc her to stop today / is ready  sched CT of sinuses and update  recommend sympt care- see pt instructions   Her updated medication list for this problem includes:    Flonase 50 Mcg/act Susp (Fluticasone propionate) .Marland Kitchen... 2 sprays in each nostril daily    Robitussin Chest Congestion 100 Mg/34ml Syrp (Guaifenesin) ..... Otc as directed.    Augmentin 875-125 Mg Tabs (Amoxicillin-pot clavulanate) .Marland Kitchen... 1 by mouth two times a day for 10 days    Guaifenesin-codeine 100-10 Mg/45ml Syrp (Guaifenesin-codeine) .Marland Kitchen... 1-2 teaspoon by mouth up to every 6 hours as needed cough caution of sedation  Orders: Radiology Referral (Radiology)  Problem # 2:  TOBACCO USE (ICD-305.1) Assessment: Improved this was disc briefly discussed in detail risks of smoking, and possible outcomes including COPD, vascular dz, cancer and also respiratory infections/sinus problems   pt plans to quit now since she has almost already  Complete Medication List: 1)  Crestor 20 Mg Tabs (Rosuvastatin calcium) .... Take one half by mouth daily 2)  Prozac 10 Mg Caps (Fluoxetine hcl) .... Take one by mouth daily a needed 3)  Flexeril 10 Mg Tabs (Cyclobenzaprine hcl) .... Take one by mouth in am and 2 tabs in pm 4)  Clarinex 5 Mg Tabs (Desloratadine) .Marland Kitchen.. 1 by mouth once daily as needed 5)  Omeprazole 20 Mg Cpdr (Omeprazole) .Marland Kitchen.. 1 by mouth two times a day 6)  Flonase 50 Mcg/act Susp (Fluticasone propionate) .... 2 sprays in each nostril daily 7)  Nystatin 100000 Unit/gm Crea (Nystatin) .... Apply once daily to affected area as needed 8)  Proventil Hfa 108 (90 Base) Mcg/act Aers (Albuterol sulfate) .... 2 puffs up to every 4 hours as needed wheezing 9)  Gabapentin 600 Mg Tabs (Gabapentin) .... Take one by mouth every 2 hours, total of 6  daily on hold 10)  Beta Seron Shots  .... For ms - dr love 11)  Robinul-forte 2 Mg Tabs (Glycopyrrolate) .Marland Kitchen.. 1 tablet twice a day 12)  Align Caps (Probiotic product) .... Take 1 tab samples given 13)  Analpram E 2.5-1 & 1 % Kit (Hydrocortisone ace-pramoxine) .... Apply two times a day to rectum as needed 14)  Anusol-hc 25 Mg Supp (Hydrocortisone acetate) .... Use rectally two times a day as needed 15)  Lyrica 75 Mg Caps (Pregabalin) .Marland Kitchen.. 1 tab by mouth every am and 2 tabs by mouth at bedtime 16)  Robitussin Chest Congestion 100 Mg/47ml Syrp (Guaifenesin) .... Otc as directed. 17)  Augmentin 875-125 Mg Tabs (Amoxicillin-pot clavulanate) .Marland Kitchen.. 1 by mouth two times a day for 10 days 18)  Guaifenesin-codeine 100-10 Mg/59ml Syrp (Guaifenesin-codeine) .Marland Kitchen.. 1-2 teaspoon by mouth up to every 6 hours as needed cough caution of sedation  Patient Instructions: 1)  continue cough medicine if you need it 2)  use nasal  saline spray at least two times a day  3)  we will schedule CT of sinuses at check out  4)  stop smoking today - you are ready   Prior Medications (reviewed today): CRESTOR 20 MG  TABS (ROSUVASTATIN CALCIUM) take one half by mouth daily PROZAC 10 MG  CAPS (FLUOXETINE HCL) take one by mouth daily a needed FLEXERIL 10 MG  TABS (CYCLOBENZAPRINE HCL) take one by mouth in AM and 2 tabs in PM CLARINEX 5 MG  TABS (DESLORATADINE) 1 by mouth once daily as needed OMEPRAZOLE 20 MG  CPDR (OMEPRAZOLE) 1 by mouth two times a day FLONASE 50 MCG/ACT  SUSP (FLUTICASONE PROPIONATE) 2 sprays in each nostril daily NYSTATIN 100000 UNIT/GM CREA (NYSTATIN) apply once daily to affected area as needed PROVENTIL HFA 108 (90 BASE) MCG/ACT AERS (ALBUTEROL SULFATE) 2 puffs up to every 4 hours as needed wheezing GABAPENTIN 600 MG TABS (GABAPENTIN) Take one by mouth every 2 hours, total of 6 daily On hold BETA SERON SHOTS () for MS - Dr Sandria Manly ROBINUL-FORTE 2 MG  TABS (GLYCOPYRROLATE) 1 tablet twice a day ALIGN  CAPS  (PROBIOTIC PRODUCT) Take 1 tab samples given ANALPRAM E 2.5-1 & 1 % KIT (HYDROCORTISONE ACE-PRAMOXINE) apply two times a day to rectum as needed ANUSOL-HC 25 MG SUPP (HYDROCORTISONE ACETATE) use rectally two times a day as needed LYRICA 75 MG CAPS (PREGABALIN) 1 tab by mouth every am and 2 tabs by mouth at bedtime ROBITUSSIN CHEST CONGESTION 100 MG/5ML SYRP (GUAIFENESIN) OTC As directed. AUGMENTIN 875-125 MG TABS (AMOXICILLIN-POT CLAVULANATE) 1 by mouth two times a day for 10 days GUAIFENESIN-CODEINE 100-10 MG/5ML SYRP (GUAIFENESIN-CODEINE) 1-2 teaspoon by mouth up to every 6 hours as needed cough caution of sedation Current Allergies: ! AMITRIPTYLINE HCL ! LYRICA BIAXIN LIPITOR SUDAFED * CYMBALTA LEVAQUIN

## 2010-04-10 NOTE — Consult Note (Signed)
Summary: Consultation Report  Consultation Report   Imported By: Eleonore Chiquito 10/21/2006 15:59:23  _____________________________________________________________________  External Attachment:    Type:   Image     Comment:   External Document

## 2010-04-20 ENCOUNTER — Ambulatory Visit (INDEPENDENT_AMBULATORY_CARE_PROVIDER_SITE_OTHER): Payer: Medicare Other | Admitting: Family Medicine

## 2010-04-20 ENCOUNTER — Other Ambulatory Visit: Payer: Self-pay | Admitting: Family Medicine

## 2010-04-20 ENCOUNTER — Encounter: Payer: Self-pay | Admitting: Family Medicine

## 2010-04-20 DIAGNOSIS — J209 Acute bronchitis, unspecified: Secondary | ICD-10-CM

## 2010-04-20 DIAGNOSIS — E7800 Pure hypercholesterolemia, unspecified: Secondary | ICD-10-CM

## 2010-04-20 DIAGNOSIS — F17200 Nicotine dependence, unspecified, uncomplicated: Secondary | ICD-10-CM

## 2010-04-20 DIAGNOSIS — E785 Hyperlipidemia, unspecified: Secondary | ICD-10-CM

## 2010-04-20 LAB — LIPID PANEL
Cholesterol: 241 mg/dL — ABNORMAL HIGH (ref 0–200)
HDL: 60.5 mg/dL (ref >39.00)
Total CHOL/HDL Ratio: 4
Triglycerides: 61 mg/dL (ref 0.0–149.0)
VLDL: 12.2 mg/dL (ref 0.0–40.0)

## 2010-04-20 LAB — AST: AST: 26 U/L (ref 0–37)

## 2010-04-20 LAB — ALT: ALT: 34 U/L (ref 0–35)

## 2010-04-20 LAB — LDL CHOLESTEROL, DIRECT: Direct LDL: 176.9 mg/dL

## 2010-04-29 ENCOUNTER — Telehealth: Payer: Self-pay | Admitting: Family Medicine

## 2010-04-29 NOTE — Assessment & Plan Note (Signed)
Summary: 6 MTH FOLLOW-UP/ [facility]   Vital Signs:  Patient profile:   55 year old female Height:      61.5 inches Weight:      168.25 pounds BMI:     31.39 O2 Sat:      97 % on Room air Temp:     98.6 degrees F oral Pulse rate:   76 / minute Pulse rhythm:   regular Resp:     20 per minute BP sitting:   100 / 70  (left arm) Cuff size:   large  Vitals Entered By: Lewanda Rife LPN (April 20, 2010 9:13 AM)  O2 Flow:  Room air CC: sixmonth f/u and productive cough with thick brown phlegm   History of Present Illness: here for f/u of lipids and tab abuse and new prod cough   wt is down 2 lb with bmi of 31  tab-- about the same - needs to quit but not plans to   lipids on crestor were in good control  due for 6 mo check  diet is good   cough- started -- about 2 weeks before  progessively worse - cannot sleep at night -- post nasal drip and deep cough  brown phlegm  has cut down smoking a bit  some mild wheeze  nose is runny mild  no ear or throat pain  mainly in chest  proventil is helping   taking some mucinex and theraflu  no fever   Allergies: 1)  ! Amitriptyline Hcl 2)  ! Lyrica 3)  Biaxin 4)  Lipitor 5)  Sudafed 6)  * Cymbalta 7)  Levaquin  Past History:  Past Medical History: Last updated: 10/13/2009 Dizziness or vertigo- chronic  GERD Seizure disorder Urinary incontinence MS plantar fasciitis s/p sx allergic rhinitis ADENOMATOUS COLON POLYPS 08/2008 GASTRITIS /H.PYLORI +08/2008 lung nodule  menopausal disorder anxiety  counselor -Evalina Field  cardilogy--Dr [facility] neurology-- Dr Sandria Manly   Past Surgical History: Last updated: 07/22/2009 ANTERIOR CERVICAL DISKECTOMY and fusion, c4-5, c5-6, c6-7. Dr. Donalee Citrin. 2004 TUBAL LIGATION, HX OF (ICD-V26.51) DILATION AND CURETTAGE, HX OF (ICD-V45.89) TONSILLECTOMY, HX OF (ICD-V45.79) FOOT SURGERY, HX OF (ICD-V15.89) CHOLECYSTECTOMY, HX OF (ICD-V45.79) 9/10 chest CT with small 4mm nodule L lung base  (rec re check in 1 yr) 5/11 re check chest CT- lung nodule stable 12/10 sinus CT negative  6/10 colonosco- polyps /- re check 5y 6/10 EGD- erosive gastritis , h pylori (treated )  Family History: Last updated: 11/26/2009 Father: CAD 6 bypass Mother: migraines, CAD - died of MI Siblings: 2 brothers, 1 sister cousin- CAD gm- CAD aunt- colon cancer 75s MGF- colon cancer 57s aunt- lung cancer  Social History: Last updated: 07/30/2008 Marital Status: Married Children: 3 Occupation: disabled- from MS smokes 1/2 to 1ppd  Alcohol Use - no Daily Caffeine Use-2  Risk Factors: Smoking Status: quit (09/30/2006)  Review of Systems General:  Complains of chills, fatigue, fever, loss of appetite, and malaise. Eyes:  Denies blurring, discharge, and eye irritation. ENT:  Complains of nasal congestion and postnasal drainage; denies sore throat. CV:  Denies chest pain or discomfort, lightheadness, palpitations, and shortness of breath with exertion. Resp:  Complains of cough, sputum productive, and wheezing; denies pleuritic. GI:  Denies diarrhea, nausea, and vomiting. MS:  Denies muscle and cramps. Derm:  Denies itching, lesion(s), poor wound healing, and rash. Neuro:  Denies numbness and tingling. Psych:  Complains of depression. Endo:  Denies excessive thirst and excessive urination. Heme:  Denies abnormal bruising  and bleeding.  Physical Exam  General:  overwt and fatigued appearing  Head:  normocephalic, atraumatic, and no abnormalities observed.  no sinus tenderness Eyes:  vision grossly intact, pupils equal, pupils round, pupils reactive to light, and no injection.   Ears:  R ear normal and L ear normal.   Nose:  boggy with clear rhinorrhea Mouth:  pharynx pink and moist.   Neck:  supple with full rom and no masses or thyromegally, no JVD or carotid bruit  Chest Wall:  No deformities, masses, or tenderness noted. Lungs:  CTA with diffusely distant bs no wheeze no rales    few rhonchi at bases and harsh bs Heart:  Normal rate and regular rhythm. S1 and S2 normal without gallop, murmur, click, rub or other extra sounds. Abdomen:  Bowel sounds positive,abdomen soft and non-tender without masses, organomegaly or hernias noted. no renal bruits  Extremities:  no CCE Skin:  Intact without suspicious lesions or rashes Cervical Nodes:  No lymphadenopathy noted Psych:  normal affect, pleasant and generally fatigued    Impression & Recommendations:  Problem # 1:  Hx of HYPERCHOLESTEROLEMIA (ICD-272.0) Assessment Unchanged  due for check on crestor and diet  rev low sat fat diet rev prev labs  lab today - do not exp change Her updated medication list for this problem includes:    Crestor 20 Mg Tabs (Rosuvastatin calcium) .Marland Kitchen... Take one half by mouth daily  Orders: TLB-Lipid Panel (80061-LIPID) Venipuncture (16109) TLB-ALT (SGPT) (84460-ALT) TLB-AST (SGOT) (84450-SGOT)  Labs Reviewed: SGOT: 24 (10/13/2009)   SGPT: 20 (10/13/2009)   HDL:49.90 (10/13/2009), 50.60 (07/23/2008)  LDL:80 (10/13/2009), 89 (07/23/2008)  Chol:156 (10/13/2009), 155 (07/23/2008)  Trig:130.0 (10/13/2009), 78.0 (07/23/2008)  Problem # 2:  BRONCHITIS, ACUTE (ICD-466.0) Assessment: New  in smoker with reactive airways (not heard today) recommend sympt care- see pt instructions   tx with zithromax (has taken without complic in past) guifen- codiene cough syrup with caution pt advised to update me if symptoms worsen or do not improve - esp wheeze disc smoking cessation Her updated medication list for this problem includes:    Proventil Hfa 108 (90 Base) Mcg/act Aers (Albuterol sulfate) .Marland Kitchen... 2 puffs up to every 4 hours as needed wheezing    Robitussin Chest Congestion 100 Mg/66ml Syrp (Guaifenesin) ..... Otc as directed.    Mucinex Dm 30-600 Mg Xr12h-tab (Dextromethorphan-guaifenesin) ..... Otc as directed.    Zithromax Z-pak 250 Mg Tabs (Azithromycin) .Marland Kitchen... Take by mouth as directed     Guaifenesin-codeine 100-10 Mg/82ml Syrp (Guaifenesin-codeine) .Marland Kitchen... 1-2 teaspoons by mouth up to every 4-6 hours as needed cough warn- does sedate  Orders: Prescription Created Electronically [id number])  Problem # 3:  TOBACCO USE (ICD-305.1) Assessment: Unchanged discussed in detail risks of smoking, and possible outcomes including COPD, vascular dz, cancer and also respiratory infections/sinus problems  disc best time to quit is when sick does not seem motivated voices understanding  Complete Medication List: 1)  Crestor 20 Mg Tabs (Rosuvastatin calcium) .... Take one half by mouth daily 2)  Flexeril 10 Mg Tabs (Cyclobenzaprine hcl) .... Take one by mouth in am and 2 tabs in pm 3)  Clarinex 5 Mg Tabs (Desloratadine) .Marland Kitchen.. 1 by mouth once daily as needed 4)  Omeprazole 20 Mg Cpdr (Omeprazole) .Marland Kitchen.. 1 by mouth two times a day 5)  Flonase 50 Mcg/act Susp (Fluticasone propionate) .... 2 sprays in each nostril daily as needed 6)  Nystatin 100000 Unit/gm Crea (Nystatin) .... Apply once daily to affected area as needed  7)  Proventil Hfa 108 (90 Base) Mcg/act Aers (Albuterol sulfate) .... 2 puffs up to every 4 hours as needed wheezing 8)  Beta Seron Shots  .... For ms - dr love 9)  Robinul-forte 2 Mg Tabs (Glycopyrrolate) .Marland Kitchen.. 1 tablet twice a day 10)  Align Caps (Probiotic product) .... Take 1 tab by mouth daily 11)  Analpram E 2.5-1 & 1 % Kit (Hydrocortisone ace-pramoxine) .... Apply two times a day to rectum as needed 12)  Anusol-hc 25 Mg Supp (Hydrocortisone acetate) .... Use rectally two times a day as needed 13)  Robitussin Chest Congestion 100 Mg/80ml Syrp (Guaifenesin) .... Otc as directed. 14)  Zoloft 100 Mg Tabs (Sertraline hcl) .Marland Kitchen.. 1 by mouth once daily 15)  Alprazolam 0.5 Mg Tabs (Alprazolam) .Marland Kitchen.. 1 by mouth up to two times a day as needed severe anxiety - only use if necessary 16)  Duke's Magic Mouthwash  .... 1 -2 teaspoons gargle and spit three times a day as needed mouth and throat  discomfort 17)  Gabapentin 300 Mg Caps (Gabapentin) .... Two capsules by mouth twice a day. 18)  Mucinex Dm 30-600 Mg Xr12h-tab (Dextromethorphan-guaifenesin) .... Otc as directed. 19)  Theraflu Multi Symptom 25 Mg Strp (Diphenhydramine hcl) .... Otc as directed. 20)  Zithromax Z-pak 250 Mg Tabs (Azithromycin) .... Take by mouth as directed 21)  Guaifenesin-codeine 100-10 Mg/45ml Syrp (Guaifenesin-codeine) .Marland Kitchen.. 1-2 teaspoons by mouth up to every 4-6 hours as needed cough warn- does sedate  Patient Instructions: 1)  labs today  2)  take zithromax as directed for bronchitis 3)  cough syrup with caution  4)  drink lots of fluids 5)  really think hard about quitting smoking  THIS IS THE TIME 6)  use proventil as needed for wheeze and update if worse 7)  update me if not imroving in a week  8)  follow up with me in 6 months  Prescriptions: GUAIFENESIN-CODEINE 100-10 MG/5ML SYRP (GUAIFENESIN-CODEINE) 1-2 teaspoons by mouth up to every 4-6 hours as needed cough warn- does sedate  #120cc x 0   Entered and Authorized by:   Judith Part MD   Signed by:   Judith Part MD on 04/20/2010   Method used:   Print then Give to Patient   RxID:   [id number] ZITHROMAX Z-PAK 250 MG TABS (AZITHROMYCIN) take by mouth as directed  #1 pack x 0   Entered and Authorized by:   Judith Part MD   Signed by:   Judith Part MD on 04/20/2010   Method used:   Electronically to        [facility] (retail)       [address]       [place], [place]  [address]       Ph: [phone] or [phone]       Fax: [phone]   RxID:   [id number] PROVENTIL HFA 108 (90 BASE) MCG/ACT AERS (ALBUTEROL SULFATE) 2 puffs up to every 4 hours as needed wheezing  #1 mdi x 11   Entered and Authorized by:   Judith Part MD   Signed by:   Judith Part MD on 04/20/2010   Method used:   [facility] to        [facility] (retail)       [address]        [place], [place]  [address]       Ph: [phone] or [phone]  Fax: [phone]   RxID:   [id number]    Orders Added: 1)  TLB-Lipid Panel [80061-LIPID] 2)  Venipuncture [64403] 3)  TLB-ALT (SGPT) [84460-ALT] 4)  TLB-AST (SGOT) [84450-SGOT] 5)  Prescription Created Electronically [G8553] 6)  Est. Patient Level IV [47425]    Current Allergies (reviewed today): ! AMITRIPTYLINE HCL ! LYRICA BIAXIN LIPITOR SUDAFED * CYMBALTA LEVAQUIN

## 2010-05-05 NOTE — Letter (Signed)
Summary: [facility]   [facility]   Imported By: Kassie Mends 04/28/2010 08:25:10  _____________________________________________________________________  External Attachment:    Type:   Image     Comment:   External Document

## 2010-05-05 NOTE — Progress Notes (Addendum)
Summary: needs written script for crestor  Phone Note Refill Request Call back at Home Phone [phone] Message from:  Patient  Refills Requested: Medication #1:  CRESTOR 20 MG  TABS take one half by mouth daily Pt is asking for a written 90 day script to mail to [facility]- she is trying to get pt assistance, says she doesnt need [place] to fill any paper work out.   Please call when ready and she will pick up.           Lowella Petties CMA, AAMA  April 29, 2010 3:03 PM   Initial call taken by: Lowella Petties CMA, AAMA,  April 29, 2010 3:03 PM  Follow-up for Phone Call        no problem printed in put in nurse in box for pickup  Follow-up by: Judith Part MD,  April 29, 2010 3:11 PM  Additional Follow-up for Phone Call Additional follow up Details #1::        Advised pt script is ready for pick up, placed up front. Additional Follow-up by: Lowella Petties CMA, AAMA,  April 29, 2010 3:58 PM    Prescriptions: CRESTOR 20 MG  TABS (ROSUVASTATIN CALCIUM) take one half by mouth daily  #45 x 3   Entered and Authorized by:   Judith Part MD   Signed by:   Judith Part MD on 04/29/2010   Method used:   Print then Give to Patient   RxID:   [id number]   Appended Document: needs written script for crestor     Clinical Lists Changes  Medications: Rx of CRESTOR 20 MG  TABS (ROSUVASTATIN CALCIUM) take one half by mouth daily;  #45 x 3;  Signed;  Entered by: Judith Part MD;  Authorized by: Colon Flattery [name] MD;  Method used: Reprint    Prescriptions: CRESTOR 20 MG  TABS (ROSUVASTATIN CALCIUM) take one half by mouth daily  #45 x 3   Entered and Authorized by:   Judith Part MD   Signed by:   Judith Part MD on 05/19/2010   Method used:   Reprint   RxID:   [id number]

## 2010-05-12 ENCOUNTER — Encounter: Payer: Self-pay | Admitting: Family Medicine

## 2010-05-23 LAB — CBC
HCT: 44.1 % (ref 36.0–46.0)
Hemoglobin: 15.3 g/dL — ABNORMAL HIGH (ref 12.0–15.0)
MCHC: 34.7 g/dL (ref 30.0–36.0)
MCV: 97.3 fL (ref 78.0–100.0)
Platelets: 156 10*3/uL (ref 150–400)
RBC: 4.54 MIL/uL (ref 3.87–5.11)
RDW: 13.3 % (ref 11.5–15.5)
WBC: 8 10*3/uL (ref 4.0–10.5)

## 2010-05-23 LAB — GLUCOSE, CAPILLARY
Glucose-Capillary: 113 mg/dL — ABNORMAL HIGH (ref 70–99)
Glucose-Capillary: 166 mg/dL — ABNORMAL HIGH (ref 70–99)
Glucose-Capillary: 97 mg/dL (ref 70–99)

## 2010-05-23 LAB — BASIC METABOLIC PANEL
BUN: 11 mg/dL (ref 6–23)
CO2: 30 mEq/L (ref 19–32)
Calcium: 10.2 mg/dL (ref 8.4–10.5)
Chloride: 98 mEq/L (ref 96–112)
Creatinine, Ser: 1.05 mg/dL (ref 0.4–1.2)
GFR calc Af Amer: >60 mL/min (ref >60)
GFR calc non Af Amer: 55 mL/min — ABNORMAL LOW (ref >60)
Glucose, Bld: 91 mg/dL (ref 70–99)
Potassium: 4.5 mEq/L (ref 3.5–5.1)
Sodium: 138 mEq/L (ref 135–145)

## 2010-06-03 ENCOUNTER — Other Ambulatory Visit: Payer: Self-pay | Admitting: Family Medicine

## 2010-06-03 DIAGNOSIS — E7800 Pure hypercholesterolemia, unspecified: Secondary | ICD-10-CM

## 2010-06-04 ENCOUNTER — Other Ambulatory Visit (INDEPENDENT_AMBULATORY_CARE_PROVIDER_SITE_OTHER): Payer: Medicare Other | Admitting: Family Medicine

## 2010-06-04 DIAGNOSIS — E7800 Pure hypercholesterolemia, unspecified: Secondary | ICD-10-CM

## 2010-06-04 LAB — LIPID PANEL
Cholesterol: 145 mg/dL (ref 0–200)
HDL: 62 mg/dL (ref >39.00)
LDL Cholesterol: 73 mg/dL (ref 0–99)
Total CHOL/HDL Ratio: 2
Triglycerides: 49 mg/dL (ref 0.0–149.0)
VLDL: 9.8 mg/dL (ref 0.0–40.0)

## 2010-06-04 LAB — AST: AST: 20 U/L (ref 0–37)

## 2010-06-04 LAB — ALT: ALT: 16 U/L (ref 0–35)

## 2010-06-04 NOTE — Medication Information (Signed)
Summary: [employer]   Imported By: Kassie Mends 05/25/2010 10:39:04  _____________________________________________________________________  External Attachment:    Type:   Image     Comment:   External Document

## 2010-07-06 ENCOUNTER — Encounter: Payer: Self-pay | Admitting: Family Medicine

## 2010-07-07 ENCOUNTER — Encounter: Payer: Self-pay | Admitting: Family Medicine

## 2010-07-07 ENCOUNTER — Ambulatory Visit (INDEPENDENT_AMBULATORY_CARE_PROVIDER_SITE_OTHER): Payer: Medicare Other | Admitting: Family Medicine

## 2010-07-07 VITALS — BP 124/78 | HR 82 | Temp 98.3°F | Wt 171.1 lb

## 2010-07-07 DIAGNOSIS — R229 Localized swelling, mass and lump, unspecified: Secondary | ICD-10-CM

## 2010-07-07 NOTE — Progress Notes (Signed)
  Subjective:    Patient ID: Karina [name], female    DOB: [birth year], 55 y.o.   MRN: [id number]  HPI Is getting beta serum shots  One spot on R buttock -- is quite sore -- hurts to move  Not taking them anymore ( Dr Sandria Manly) -- because knots are recurrent and bothersome She is investigating another option in pill form- but many side eff  Overall feels better off the shots after 14 years MS is relatively stable - not a lot of change   Knots in abd too - they are not as painful because no friction      Review of Systems Review of Systems  Constitutional: Negative for fever, appetite change,  and unexpected weight change.  Eyes: Negative for pain and visual disturbance.  Respiratory: Negative for cough and shortness of breath.   Cardiovascular: Negative.   Gastrointestinal: Negative for nausea, diarrhea and constipation.  Genitourinary: Negative for urgency and frequency.  Skin: Negative for pallor.  Neurological: Negative for weakness, light-headedness, numbness and headaches.  Hematological: Negative for adenopathy. Does not bruise/bleed easily.  Psychiatric/Behavioral: Negative for dysphoric mood. The patient is not nervous/anxious.          Objective:   Physical Exam  Constitutional: She appears well-developed and well-nourished.       overwt and well appearing   HENT:  Head: Normocephalic and atraumatic.  Eyes: Conjunctivae and EOM are normal. Pupils are equal, round, and reactive to light.  Neck: Normal range of motion. No JVD present. No thyromegaly present.  Cardiovascular: Normal rate and normal heart sounds.   Pulmonary/Chest: Effort normal.  Musculoskeletal: She exhibits tenderness.  Lymphadenopathy:    She has no cervical adenopathy.  Skin: Skin is warm and dry. No erythema.       Several 1 cm knots felt in soft tissue of abdomen that are nt and mobile Larger 3-4 cm linear mobile tender soft tissue mass in R buttock -- per pt hurts most to sit on it No skin  color change or interruption  Psychiatric: She has a normal mood and affect.          Assessment & Plan:

## 2010-07-07 NOTE — Patient Instructions (Signed)
We will do general surgeon referral at check out for soft tissue lump  Pt has no preference - [place] or [place]  Continue ice and heat if they help

## 2010-07-07 NOTE — Assessment & Plan Note (Signed)
Chronic soft tissue lump in R buttock from past beta seron injections -- is becoming more painful Pt thinks it is over a year old Suspect scar rxn/ hematoma  Also has some on abdomen- but they do not hurt No signs of infection Ref to gen surgeon to eval

## 2010-07-21 NOTE — Assessment & Plan Note (Signed)
[facility] OFFICE NOTE   Karina [name], Karina [name]                       MRN:          [id number]  DATE:07/07/2007                            DOB:          10/26/1955    PRIMARY CARE PHYSICIAN:  Marne A. Milinda Antis, M.D.   REASON FOR PRESENTATION:  Evaluate patient with chest pain.  She is  being considered for bladder sling surgery.   HISTORY OF PRESENT ILLNESS:  The patient is a pleasant 55 year old white  female without a prior cardiac history.  She does have significant  cardiovascular risk factors.  She has some urinary incontinence and is  being considered for a bladder sling operation.  She saw Dr. Milinda Antis for  preoperative evaluation and described for her chest discomfort.   The patient reports that she has had chest discomfort on and off for a  year or more.  It does seem like it is increasing in frequency and  intensity.  It may happen 2-3 times a week.  It is in her left upper  chest.  It radiates down her left arm.  It is a sharp discomfort.  It  happens at rest.  It may be 10/10 and last for a couple of minutes.  At  times she has taken an aspirin a rid of it.  She does not feel any  palpitations, nausea, vomiting, or diaphoresis, though she might get a  little lightheaded with it.  She has had some shortness of breath and  chest heaviness and maybe these symptoms at times when doing activities  such as working in the yard.  She is limited by low back pain and  multiple sclerosis.  She does not report any palpitations, presyncope,  or syncope.  She is not having any PND or orthopnea.   PAST MEDICAL HISTORY:  1. Hyperlipidemia since 1987.  2. Syncope related to low blood sugars in the past (this was      apparently the seizure that she experienced in the past).  3. Gastroesophageal reflux disease.  4. Multiple sclerosis, diagnosed in 1997.   PAST SURGICAL HISTORY:  1. Disk surgery.  2. Cholecystectomy.  3. Foot surgery.  4. Tonsillectomy.   ALLERGIES:  BACLOFEN, BEE STINGS, AMITRIPTYLINE.   MEDICATIONS:  1. Crestor 10 mg daily.  2. Clarinex 5 mg daily.  3. Amoxicillin.  4. Betaseron.  5. Neurontin 3 mg q.h.s.  6. Prozac 20 mg daily.   SOCIAL HISTORY:  The patient is disabled.  She is a housewife.  She is  married.  She has 3 children and 11 grandchildren.  She had been smoking  since age 89.  She was at 2 packs per day but is now down to a pack per  day.  She occasionally drinks alcohol.   FAMILY HISTORY:  Contributory for her mother having heart failure and  dying at age 89.  The patient reports she died to an anaphylactic  reaction to penicillin.  Her father had heart disease with bypass at age  40 and is still alive at age 22.   REVIEW  OF SYSTEMS:  Positive for occasional headaches, dizziness,  vertigo, cough, occasional diarrhea and constipation, reflux, feet and  leg cramps, joint pains, slowly progressive weight gain.  Negative for  other systems.   PHYSICAL EXAMINATION:  GENERAL:  The patient is in no distress.  VITAL SIGNS:  Blood pressure 110/72, heart rate 63 and regular, weight  185 pounds.  HEENT:  Eyelids unremarkable.  Pupils equal, round, and reactive to  light.  Fundi not well visualized.  Oral mucosa normal.  NECK:  No jugular venous distention at 45 degrees.  Carotid upstroke  brisk and symmetrical.  ABDOMEN:  No bruits, no rebound, no guarding.  No midline pulsatile  masses.  No hepatomegaly and no splenomegaly.  SKIN:  No rashes, no nodules.  EXTREMITIES:  2+ pulses throughout, no edema.  No cyanosis or clubbing.  NEUROLOGIC:  Oriented to person, place, time.  Cranial nerves II-XII  grossly intact, motor grossly intact.   EKG (done in Dr. Royden Purl office):  Sinus rhythm, rate 80, axis within  normal limits, intervals within normal limits, no acute ST-T wave  changes.   ASSESSMENT AND PLAN:  1. Chest.  The patient has chest discomfort that is  suspicious for      exertional angina.  It is progressive.  She has multiple      cardiovascular risk factors.  Given this, the pretest probability      of obstructive coronary disease is at least moderately high.  The      patient has limited functional capacity.  She is going for surgery      that is moderate risk at most.  Therefore, according to [facility]/American Heart Association guidelines, the      patient needs screening with a stress perfusion study.  I am going      to try to walk her on a treadmill and do perfusion imaging.      However, if she cannot achieve a target heart rate, she can be      converted to adenosine.  Further preoperative clearance and medical      management will be based on these results.  2. Tobacco.  We talked about the need to stop smoking (greater than 3      minutes).  She is trying.  3. Dyslipidemia.  Per Dr. Milinda Antis.  I think the goal should be a low-      density lipoprotein of less than 100, high-density lipoprotein of      greater than 50 given her family history and risk factors.  4. Obesity.  She understands the need to lose weight with exercise.   FOLLOW UP:  I will see her back based on the results of the above.     Rollene Rotunda, MD, [facility]  Electronically Signed    JH/MedQ  DD: 07/07/2007  DT: 07/07/2007  Job #: [id number]   cc:   Marne A. Milinda Antis, MD  Lendon Colonel, MD

## 2010-07-23 ENCOUNTER — Ambulatory Visit: Payer: Self-pay

## 2010-07-24 NOTE — Op Note (Signed)
NAME:  Karina [name], Karina [name]                          ACCOUNT NO.:  [id number]   MEDICAL RECORD NO.:  [id number]                   PATIENT TYPE:  INP   LOCATION:  3012                                 FACILITY:  [facility]   PHYSICIAN:  Donalee Citrin, M.D.                     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   OPERATION PERFORMED:  Anterior cervical diskectomy with fusion at C4-5, C5-6  and C6-7 using two 7 mm patellar wedges at C4-5 and C6-7 and a 6 mm at C5-6  and Premiere plating system with eight 13 mm variable angle screws.   SURGEON:  Donalee Citrin, M.D.   ASSISTANT:  Kathaleen Maser. Pool, M.D.   ANESTHESIA:  General endotracheal.   INDICATIONS FOR PROCEDURE:  The patient is a very pleasant 55 year old  female who has had longstanding neck and arm pain that has been managed  through the pain clinic, having been refractory to conservative treatment.  The patient has complained predominantly of neck pain with secondary  radiation down the left arm in what appeared to be a C5-6 distribution.  Preoperative imaging showed significant cervical spondylitic disease and  cervical stenosis C4-5, C5-6 and C6-7 with uncinate hypertrophy and severe  biforaminal stenosis at C4-5 and C5-6.  There was a large central disk  compressing the spinal cord at C6-7.  There was predominant spondylosis with  biforaminal stenosis C7.  The patient was recommended two-level and three-  level cervical diskectomy and fusion.  I extensively went over the risks and  benefits of surgery with her. She understands anastomosis agrees to proceed  forward.   DESCRIPTION OF PROCEDURE:  The patient was brought to the  operating room and  was induced under general anesthesia supine with the neck in slight  extension and five pounds of halter traction.  C-arm location.  The C5-6  disk space.  A curvilinear incision was made at this level from just the  midline to the anterior sternocleidomastoid.  The superficial layer of  platysma was dissected down and divided longitudinally.  The avascular plane  between the sternocleidomastoid and strap muscles was developed down to the  prevertebral fascia.  Prevertebral fascia was dissected with Kitners.  Intraoperative x-ray confirmed localization of C4-5 disk space. Then the  prevertebral fascia was dissected away at C4-5, C5-6 and C6-7 disk spaces.  The longus colli was reflected laterally and a self-retaining retractor was  placed.  Then using 15 blade scalpel, annulotomy was made over all three  disk spaces and pituitary rongeurs were used to remove the anterior margin  of the annulus  of all three disk spaces.  Then using high speed drill  remainder of disk space was drilled down.  The end plates were scraped down  to the posterior osteophyte and posterior longitudinal ligament at all three  disk spaces.  Then the operating microscope was draped and brought onto the  field and first attention was taken to the C4-5 disk space.  Using the high  speed drill again, further drilling __________  posterior annulus.  Then  using 1 and 2 mm Kerrison punches, large anterior osteophytes were removed  off the C5 vertebral body as well as uncinate process to decompress the  posterior longitudinal ligament which was removed in piecemeal fashion  decompressing the thecal sac on both the C5 neural foramina.  Both neural  foramina widely opened up and the angled nerve hook was explored.  Each  neural foramina and noted to be widely patent.  Gelfoam was placed in the  disk space.  Attention was turned to C5-6.  Pathology here was predominantly  central and disk not spondylosis.  There was a large central fragment that  had migrated subligamentous compressing on the thecal sac. This was teased  away with a blunt nerve hook and the __________ thecal sac was decompressed  at the proximal aspect of both C6 neural foramina.  End plates were scraped  to prepare for receiving bone graft and Gelfoam was placed.  At C7 again, a  high speed drill was used to drill down to the posterior annulus.  The  posterior longitudinal ligament was removed in piecemeal fashion 1 and 2 mm  Kerrison punches.  There was a very large osteophytic process coming off the  C6 vertebral body.  This was all underbitten and both C7 neural foramina  widely decompressed out their foramina.  Then again the end plates were  scraped and prepared to receive bone graft.  Two 7 mm wedges were inserted 1  mm deep to the anterior vertebral body line at C4-5 and C6-7. Then the 6 mm  wedge inserted C5-6.  Meticulous hemostasis was maintained. Then the  Premiere plate was inserted 57 mm with eight 13 mm fixed angle screws.  These were all inserted.  Excellent purchase on all screws and the locking  mechanism was engaged.  The plate, screws and bone grafts were noted to be  in good position by fluoroscopy.  The wound was then copiously irrigated.  Meticulous hemostasis was maintained.  The platysma was reapproximated with  3-0 interrupted Vicryls.  Skin was closed with running 4-0 subcuticular.  Benzoin and Steri-Strips applied.  The patient was taken to the recovery  room in stable condition. At the end of the case sponge, needle and  instrument counts were correct.                                               Donalee Citrin, M.D.    GC/MEDQ  D:  11/07/2002  T:  11/07/2002  Job:  [id number]

## 2010-07-24 NOTE — H&P (Signed)
NAMEAMORETTE, CHARRETTE NO.:  [id number]   MEDICAL RECORD NO.:  [id number]          PATIENT TYPE:  AMB   LOCATION:  SDC                           FACILITY:  [facility]   PHYSICIAN:  Richardean Sale, M.D.   DATE OF BIRTH:  1955/06/26   DATE OF ADMISSION:  DATE OF DISCHARGE:                                HISTORY & PHYSICAL   PREOPERATIVE DIAGNOSIS:  Menorrhagia.   HISTORY OF PRESENT ILLNESS:  This is a 55 year old white female, gravida 3,  para 3, with persistent heavy menses.  Ultrasound shows normal  __________endometrial stripe and endometrial biopsy was negative for  hyperplasia or malignancy.  The patient presents today for surgical  management with hysteroscopy, D&C, and NovaSure endometrial ablation.   PAST MEDICAL HISTORY:  1.  Multiple sclerosis.  2.  Hypercholesterolemia.   PAST SURGICAL HISTORY:  1.  Bilateral tubal ligation.  2.  Herniated disk removal.   OBSTETRIC HISTORY:  Vaginal delivery x3.   GYNECOLOGIC HISTORY:  No abnormal Pap smears or sexually transmitted  infections.   MEDICATIONS:  Betaseron, Crestor, Tizanidine, and Neurontin.   ALLERGIES:  No known drug allergies.   SOCIAL HISTORY:  She smokes one pack a day, denies alcohol or illicit drugs.   FAMILY HISTORY:  Dad has coronary artery disease, mom has scleroderma.  Positive for colon cancer in a grandparent.  No breast, ovarian, or uterine  cancer.   PHYSICAL EXAMINATION:  VITAL SIGNS:  Height 5 feet 2 inches, weight 184  pounds, blood pressure 120/80, afebrile.  GENERAL:  She is a well-developed, well-nourished, white female, who is in  no acute distress.  HEART:  Regular rate and rhythm.  LUNGS:  Clear to auscultation bilaterally.  NECK:  Neck and thyroid are within normal limits without masses.  ABDOMEN:  Soft, nontender, nondistended, with no palpable masses.  Liver and  spleen are normal.  NEUROLOGIC:   Dictation stopped at this point.      Richardean Sale, M.D.     JW/MEDQ  D:  11/22/2004  T:  11/22/2004  Job:  [id number]

## 2010-07-24 NOTE — Consult Note (Signed)
[facility]  Patient:    Karina [name], Karina [name]                       MRN: [id number] Proc. Date: 12/23/99 Adm. Date:  [id number] Disc. Date: [id number] Attending:  Donnetta Hutching CC:         Genene Churn. Love, M.D.   Consultation Report  HISTORY OF PRESENT ILLNESS:  Karina [name] is a 55 year old right-handed white female, born Aug 22, 1955, with a history of multiple sclerosis, followed by Dr. Genene Churn. Love.  This patient has been treated with Betaseron and was diagnosed approximately four years ago.  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  Patient was set up for an MRI scan of the brain, which was done at [facility] yesterday.  Patient was at the grocery store today around 2:30 p.m. when she noted onset of ______.  Patient then blacked out, apparently striking the vertex of her head on the way down, had a witnessed generalized seizure event with urinary incontinence and tongue biting.  Patient was brought to the emergency room and has fully recovered neurologically from the above event.  Patient does not some soreness on the head following the fall.  Patient has undergone some blood work and has not received any anticonvulsants at this time.  Patient denies any sudden withdrawal off of any medications in the recent past. Patient denies use of illicit drugs; drug screen was unremarkable.  Neurology was asked to see this patient for further evaluation.  PAST MEDICAL HISTORY 1. History of multiple sclerosis. 2. New-onset seizures, as above. 3. History of hypercholesterolemia. 4. History of obesity. 5. History of bilateral tubal ligation in the past.  MEDICATIONS 1. Betaseron. 2. Lipitor one a day. 3. Paxil 20 mg a day. 4. Celebrex one a day. 5. Neurontin  tablet are taken on occasion for sleep but not on a regular    basis.  ALLERGIES:  Patient has no known allergies.  SOCIAL HISTORY:  Quit smoking six weeks; was smoking two packs of cigarettes a day.  Does not drink alcohol.  Patient is married and has three children who are alive and well.  [place]  FAMILY MEDICAL HISTORY:  Notable for mother with scleroderma and fibromyalgia. Father is alive and well.  Patient has two brothers and one sister who are alive and well.  Patient does have a grandson with seizures at age 74.  REVIEW OF SYSTEMS:  Notable for no fever or chills.  Patient denies significant headache.  Has occasional problems with swallowing.  Denies shortness of breath.  Has had some chest pain off and on over the last two weeks.  Had some slight nausea yesterday with gadolinium contrast agent and denies any problems controlling the bowel or bladder.  Has had some increasing arm weakness recently.  Denies any falls.  PHYSICAL EXAMINATION  VITALS:  Blood pressure is 116/62.  Heart rate 81.  Respiratory rate is 22. Temperature:  Afebrile.  GENERAL:  This patient is a moderately obese white female who is alert and cooperative at the time of examination.  HEENT:  Head is atraumatic.  Eyes:  Pupils are equal, round and reactive to light.  Disks are flat bilaterally.  A "goose egg"  is noted at the vertex of the head.  NECK:  Supple.  No carotid bruits noted.  RESPIRATORY:  Clear to auscultation and percussion.  CARDIOVASCULAR:  Regular rate and rhythm without obvious murmurs or rubs noted.  EXTREMITIES:  Without significant edema.  NEUROLOGIC:  Cranial nerves as above.  Facial symmetry is present.  Patient has good pinprick sensation on the face bilaterally, good strength of facial muscles and the muscles of head turning and shoulder shrugs bilaterally. Speech is well-enunciated.  Visual fields are full to double  simultaneous stimulation.  Patient has good strength in all four extremities.  Good and symmetric motor tone is noted bilaterally.  Sensory testing is intact to pinprick, soft touch and vibratory sensation throughout.  Patient has fairly good finger-to-nose-to-finger and toe-to-finger bilaterally.  No pronator drift is seen.  Deep tendon reflexes are symmetric and normal.  Toes are neutral bilaterally.  Again, pinprick, soft touch, vibratory sensation and position sense on the body is unremarkable.  LABORATORY AND X-RAY FINDINGS:  Laboratory values are notable for sodium of 136, potassium 4.5, chloride of 106, CO2 of 25, glucose of 92, BUN of 8, creatinine 0.9, calcium 9.3, total protein 7.3, albumin of 4.1, AST of 34, ALT of 29, alkaline phosphatase 59, bilirubin 0.4.  Drug screen negative.  CBC was done but is pending at this time.  A reading of the MRI scan is pending at this point.  IMPRESSION 1. History of multiple sclerosis. 2. New-onset seizure.  The cause of the seizure is not clear.  Patient does have multiple sclerosis, which can be associated with increased risk of seizures.  Patient seems to be back at her baseline at this point.  This is the first seizure in this patient and will hold off on anticonvulsant therapy for now; will pursue a bit further workup, however.  PLAN 1. I will follow up with the report of the MRI scan of the brain. 2. EEG studies as an outpatient. 3. No anticonvulsants for now. 4. Patient is not to drive or work at [place] until further notice. 5. Patient will follow up with [facility] in about three    weeks.  DIAGNOSIS CODE:  345.10. DD:  12/23/99 TD:  12/24/99 Job: [id number]

## 2010-07-24 NOTE — Op Note (Signed)
NAMEMALEEKA, SABATINO NO.:  [id number]   MEDICAL RECORD NO.:  [id number]          PATIENT TYPE:  AMB   LOCATION:  SDC                           FACILITY:  [facility]   PHYSICIAN:  Richardean Sale, M.D.   DATE OF BIRTH:  Jun 06, 1955   DATE OF PROCEDURE:  11/23/2004  DATE OF DISCHARGE:                                 OPERATIVE REPORT   PREOPERATIVE DIAGNOSIS:  Menorrhagia.   POSTOPERATIVE DIAGNOSES:  Menorrhagia.   PROCEDURE:  D&C hysteroscopy with NovaSure endometrial ablation.   SURGEON:  Richardean Sale, M.D.   ASSISTANT:  None.   ANESTHESIA:  General and local.   SPECIMENS:  Endometrial curettings sent to pathology.   COMPLICATIONS:  None.   BLOOD LOSS:  Minimal.   FLUID DEFICIT:  40 mL   FINDINGS:  Normal-appearing endometrial cavity with benign appearing  endometrial polyps and uterine sound length 10 cm, cervical length 5.5 cm.   INDICATIONS:  This is a 55 year old white female gravida 3, para 3 with  menorrhagia resulting in limitation of daily activity with flooding of her  clothes. The patient is not a candidate for oral contraceptive pills  secondary to multiple medical problems in addition to smoking. The patient  underwent ultrasound which showed a normal appearing uterus. There were two  small fibroids that were not impinging on the cavity. Endometrial thickness  was only 5 mm. Endometrial biopsy previously was negative. The patient  presents today for hysteroscopy D&C and NovaSure endometrial ablation for  diagnosis and to control bleeding. Prior to the procedure, risks, benefits,  alternatives of the procedure reviewed with the patient in detail. We  discussed risks which include but not limited to hemorrhage requiring  transfusion, infection, injury to the uterus which could require additional  surgery including hysterectomy or abdominal surgery to evaluate for any  injury to the bowel or bladder. We also discussed general surgical risks  including deep vein thrombosis and pulmonary embolus and anesthetic risks  was reviewed as well. The patient voiced understanding of all risks prior to  the procedure and desired to proceed. Informed consent was obtained before  proceeding to the OR.   PROCEDURE:  The patient was taken to the operating room where she was given  a general anesthetic. She was then prepped and draped in usual sterile  fashion and placed in the dorsal lithotomy position. A red rubber catheter  was used to drain the bladder. Bimanual exam was performed which revealed  the presence of a slightly enlarged anteverted uterus with no obvious  masses. Adnexa were not palpable. A single-tooth tenaculum was then used to  grasp the anterior lip of cervix and a paracervical block was then  administered using a total of 20 mL of 1% Nesacaine. The uterus was then  sounded to 10 cm. Cervical length was measured at 5.5 cm. The uterus was  then very carefully dilated with Hegar dilators and hysteroscope was then  introduced. It revealed a benign-appearing endometrial polyp as well as  shaggy appearing endometrium. No obvious malignancy was identified. Tubal  ostia appeared normal. At this point  the polyp forceps were then introduced  to remove the endometrial polyp. This was followed by sharp curettage and  the specimen was then sent to pathology labeled as endometrial curetting.  The NovaSure ablation device was then inserted and the uterus. Length was  set at 4.5 cm. After appropriately fitting the apparatus to the uterus, the  width was set at 4.4 cm. The CO2 test was then performed and passed and the  ablation procedure was then performed with a power of 109 and total of 1  minute and 44 seconds. At the completion of the procedure the NovaSure  device was then removed. The hysteroscope was then introduced. There  appeared to be good ablation of the entire endometrial cavity with no  evidence of perforation. At this point  the procedure was terminated. The  hysteroscope was removed. The single-tooth tenaculum was removed. There was  no bleeding coming from the tenaculum site and no bleeding coming from the  cervical os. The speculum was then removed by manual and the procedure was  terminated. The patient was then taken out of the dorsal lithotomy position,  was awakened from her general anesthesia and was taken to recovery room  awake and stable condition. There were no complications.  All sponge, lap,  needle, and instrumet counts were correct times two.      Richardean Sale, M.D.  Electronically Signed     JW/MEDQ  D:  11/23/2004  T:  11/24/2004  Job:  [id number]

## 2010-07-24 NOTE — H&P (Signed)
Karina [name], DIMINO NO.:  [id number]   MEDICAL RECORD NO.:  [id number]          PATIENT TYPE:  AMB   LOCATION:  [facility]                           FACILITY:  [facility]   PHYSICIAN:  Richardean Sale, M.D.   DATE OF BIRTH:  [birth year]   DATE OF ADMISSION:  DATE OF DISCHARGE:                                HISTORY & PHYSICAL   PREOPERATIVE DIAGNOSIS:  Menorrhagia.   HISTORY OF PRESENT ILLNESS:  This is a 55 year old white female, gravida 3,  para 3, with menorrhagia, who presents today for hysteroscopy, D&C, and  NovaSure endometrial ablation.  The patient's menses have become  progressively more heavy with soiling of her clothes and interrupting her  normal daily activities.  The patient underwent ultrasound which showed two  small fibroids that do not impinge upon the endometrium.  Maximum diameter  is only 2.5 cm.  Endometrium appeared normal on ultrasound.  Endometrial  biopsy was obtained which was negative for hyperplasia or malignancy.  The  patient is not a candidate for oral contraceptive pills, given tobacco use  and being 55 years old.   PAST MEDICAL HISTORY:  1.  Multiple sclerosis.  2.  Hypercholesterolemia.   PAST SURGICAL HISTORY:  1.  Bilateral tubal ligation.  2.  Herniated disk removal.   PAST OBSTETRIC HISTORY:  Vaginal delivery x3.   GYNECOLOGIC HISTORY:  No history of abnormal Pap smears or STDs.   MEDICATIONS:  Betaseron, Crestor, Tizanidine, Neurontin.   ALLERGIES:  No known drug allergies.   SOCIAL HISTORY:  She smokes a pack a day, denies alcohol or illicit drug  use.   FAMILY HISTORY:  Positive for colon cancer in a grandparent.  No history of  breast, ovarian, or uterine cancer.  Positive for coronary artery disease in  her father and scleroderma in her mother.   PHYSICAL EXAMINATION:  VITAL SIGNS:  Height 5 feet 2 inches, weight 184  pounds.  Blood pressure was 120/80.  GENERAL:  She is a well-developed, well-nourished white female  who is in no  acute distress.  HEART:  Regular rate and rhythm.  LUNGS:  Clear to auscultation bilaterally.  NECK:  Neck and thyroid within normal limits without masses.  ABDOMEN:  Soft, nontender, nondistended.  No palpable hernia.  Liver and  spleen are normal.  NEUROLOGIC:  Mood and affect are normal.  BREASTS:  Symmetric.  No masses, no lymphadenopathy.  Nontender.  No nipple  discharge.  GENITALIA:  External genitalia appear normal.  The vagina is pink, moist,  and rugated with good support.  The cervix is without lesions.  There is no  cervical motion tenderness.  Uterus is anteverted, midline, mobile,  approximately 6 weeks' size.  Adnexa:  No palpable masses, nontender.  RECTAL:  Rectal exam confirms above exam.   ASSESSMENT:  A 55 year old gravida 3, para 3 white female with menorrhagia.   PLAN:  Will proceed with hysteroscopy, D&C, and NovaSure endometrial  ablation.  Risks, benefits, and alternatives have been reviewed with the  patient in detail.  We discussed risks, which include, but are not limited  to, hemorrhage requiring transfusion, infection, uterine perforation which  could cause injury to the bowel, the bladder, or other organs which could  require additional surgery either at the time of this procedure or in the  future.  Also reviewed risks of anesthesia and general surgical risks  including DVT.  The patient voiced understanding of all these risks and  desires to proceed.  Informed consent has been obtained and all questions  answered.      Richardean Sale, M.D.  Electronically Signed     JW/MEDQ  D:  11/22/2004  T:  11/22/2004  Job:  [id number]

## 2010-08-27 ENCOUNTER — Encounter: Payer: Self-pay | Admitting: Cardiovascular Disease

## 2010-08-27 DIAGNOSIS — K635 Polyp of colon: Secondary | ICD-10-CM | POA: Insufficient documentation

## 2010-10-12 ENCOUNTER — Encounter: Payer: Self-pay | Admitting: Family Medicine

## 2010-10-12 ENCOUNTER — Ambulatory Visit (INDEPENDENT_AMBULATORY_CARE_PROVIDER_SITE_OTHER)
Admission: RE | Admit: 2010-10-12 | Discharge: 2010-10-12 | Disposition: A | Discharge status: Home / Self Care | Payer: Medicare Other | Source: Ambulatory Visit | Attending: Family Medicine | Admitting: Family Medicine

## 2010-10-12 ENCOUNTER — Ambulatory Visit (INDEPENDENT_AMBULATORY_CARE_PROVIDER_SITE_OTHER): Payer: Medicare Other | Admitting: Family Medicine

## 2010-10-12 DIAGNOSIS — R059 Cough, unspecified: Secondary | ICD-10-CM

## 2010-10-12 DIAGNOSIS — F17200 Nicotine dependence, unspecified, uncomplicated: Secondary | ICD-10-CM

## 2010-10-12 DIAGNOSIS — M542 Cervicalgia: Secondary | ICD-10-CM

## 2010-10-12 DIAGNOSIS — R05 Cough: Secondary | ICD-10-CM

## 2010-10-12 MED ORDER — VARENICLINE TARTRATE 0.5 MG X 11 & 1 MG X 42 PO MISC: ORAL | Status: AC

## 2010-10-12 MED ORDER — VARENICLINE TARTRATE 1 MG PO TABS: 1.0000 mg | ORAL_TABLET | Freq: Two times a day (BID) | ORAL | Status: AC

## 2010-10-12 NOTE — Assessment & Plan Note (Signed)
More cough lately - at times productive Disc in detail risks of smoking and possible outcomes including copd, vascular/ heart disease, cancer , respiratory and sinus infections  Pt voices understanding  She wants to try chantix (lost last px)- disc poss side eff  cxr today and comment Also hx of pulm nodule

## 2010-10-12 NOTE — Patient Instructions (Signed)
Try the chantix if you want to  Work hard on quitting smoking  - consider the program at [facility]  We are doing neck and chest x rays today Will update you with results and what to do next (re: neurosurgeon or more imaging)

## 2010-10-12 NOTE — Progress Notes (Signed)
Subjective:    Patient ID: Karina [name], female    DOB: 1955/09/01, 55 y.o.   MRN: [id number]  HPI Here for several issues incl joint pain in fingers/ pain in back of head  L arm problems   Cervical fusion in past C3-C7 Thinks it is "bad" again -- -- has had fusion twice Back of head swollen and getting hot  Ears feel pressure  Painful to move neck in any directions  It locks - if she looks R and she has to manually move her head  This is worse than before when it was revised  L arm is weak and with shoulder pain (has had shots in it) - weak also  Fingers hurt - hard to grip  Both arms to numb to raise up  neurosurg in past was Dr Alfredo Bach she will want to go somewhere else   No trauma to her neck No heavy lifting at all   Overall MS is doing very well   Has cut back on smoking  More cough and feels like there is more fluid in it   No new stressors   Patient Active Problem List  Diagnoses  . HYPOGLYCEMIA  . UNSPECIFIED VITAMIN D DEFICIENCY  . HYPERCHOLESTEROLEMIA  . TOBACCO USE  . ANXIETY, SITUATIONAL  . MULTIPLE SCLEROSIS  . NEUROPATHY-PERIPHERAL  . HEMORRHOIDS-EXTERNAL  . ALLERGIC RHINITIS  . LUNG NODULE  . GERD  . IRRITABLE BOWEL SYNDROME  . FIBROCYSTIC BREAST DISEASE  . OSTEOARTHRITIS, SHOULDER, RIGHT  . ARTHRALGIA  . BACK PAIN, LUMBAR, WITH RADICULOPATHY  . ROTATOR CUFF SYNDROME, RIGHT  . FOOT PAIN, BILATERAL  . SEIZURE DISORDER  . DIZZINESS OR VERTIGO  . SYMPTOM, EDEMA  . URINARY INCONTINENCE  . HYPERGLYCEMIA, BORDERLINE  . SYNCOPE, HX OF  . PERSONAL HX COLONIC POLYPS  . FOOT SURGERY, HX OF  . Other acquired absence of organ  . DILATION AND CURETTAGE, HX OF  . OTHER DISORDERS OF EYELID  . ABDOMINAL PAIN-MULTIPLE SITES  . Lump of skin  . Cough  . Neck pain   Past Medical History  Diagnosis Date  . Vertigo   . GERD (gastroesophageal reflux disease)   . Seizure disorder   . Urinary incontinence   . Plantar fasciitis   . Allergy   . Colon  polyps 08/2008    Adenomatous  . Gastritis 08/2008    H. pylori  . Lung nodule   . Menopausal disorder   . Anxiety     [facility]- Evalina Field  . Multiple sclerosis   . Allergic rhinitis    Past Surgical History  Procedure Date  . Cervical discectomy 2004    Anterior; Fusion C4-5, C5-6, C6-7, Dr. Donalee Citrin  . Tubal ligation   . Dilation and curettage of uterus   . Tonsillectomy   . Foot surgery   . Cholecystectomy   . Chest ct 11/2008    With small 4 mm nodule L lung base (rec re check in 1 year)  . Chest ct 07/2009    Re check chest CT - lung nodule stable  . Ct sinus ltd w/o cm 02/2009    negative  . Colonoscopy 08/2008    Polyps/ re check 5 yrs  . Esophagogastroduodenoscopy 08/2008    Erosive gastritis, h pylori (treated)   History  Substance Use Topics  . Smoking status: Current Everyday Smoker -- 0.8 packs/day    Types: Cigarettes  . Smokeless tobacco: Not on file   Comment: 1/2 to 1  pack per day   . Alcohol Use: No   Family History  Problem Relation Age of Onset  . Migraines Mother   . Heart attack Mother   . Coronary artery disease Mother   . Coronary artery disease Father     6 bypasses   . Colon cancer Maternal Grandfather 80  . Coronary artery disease Cousin   . Coronary artery disease Other     Grandmother  . Colon cancer Other 50    Aunt  . Lung cancer Other     Aunt   Allergies  Allergen Reactions  . Percocet (Oxycodone-Acetaminophen) Shortness Of Breath    Felt like going to pass out.  . Amitriptyline Hcl     REACTION: swelling and itching  . Atorvastatin     REACTION: elevated LFT's  . Clarithromycin     REACTION: reaction not known  . Duloxetine     REACTION: pain  . Levofloxacin     REACTION: Rash  . Pregabalin     REACTION: LE swelling  . Pseudoephedrine     REACTION: legs hurt   Current Outpatient Prescriptions on File Prior to Visit  Medication Sig Dispense Refill  . albuterol (PROVENTIL HFA) 108 (90 BASE) MCG/ACT inhaler  2 puffs up to every 4 hours as needed for wheezing      . cyclobenzaprine (FLEXERIL) 10 MG tablet Take 10 mg by mouth at bedtime.       . fluticasone (FLONASE) 50 MCG/ACT nasal spray 2 sprays in each nostril daily as needed      . gabapentin (NEURONTIN) 300 MG capsule Take 600 mg by mouth at bedtime.       Marland Kitchen guaiFENesin-codeine (ROBITUSSIN AC) 100-10 MG/5ML syrup Take 5 mLs by mouth 3 (three) times daily as needed. 1-2 teaspoons by mouth up to every 4-6 hours as needed for cough warn- does sedate       . Omeprazole 20 MG TBEC Take by mouth. One by mouth two times a day       . rosuvastatin (CRESTOR) 20 MG tablet Take 10 mg by mouth daily.       Marland Kitchen ALPRAZolam (XANAX) 0.5 MG tablet 1 by mouth up to two times a day as needed for severe anxiety- only use if necessary      . Alum & Mag Hydroxide-Simeth (MAGIC MOUTHWASH) SOLN Take by mouth. 1-2 teaspoons  Gargle and spit three times a day as needed for mouth and throat discomfort       . azithromycin (ZITHROMAX) 250 MG tablet Take 2 tablets by mouth on day 1, followed by 1 tablet by mouth daily for 4 days.        . bifidobacterium infantis (ALIGN) capsule Take 1 capsule by mouth daily.        . cetirizine (ZYRTEC) 10 MG tablet Take 10 mg by mouth daily.        Marland Kitchen desloratadine (CLARINEX) 5 MG tablet Take 5 mg by mouth daily.        Marland Kitchen dextromethorphan-guaiFENesin (MUCINEX DM) 30-600 MG per 12 hr tablet Take 1 tablet by mouth every 12 (twelve) hours. OTC as directed       . DiphenhydrAMINE HCl (THERAFLU MULTI SYMPTOM) 25 MG STRP Take by mouth as directed.        Marland Kitchen glycopyrrolate (ROBINUL-FORTE) 2 MG tablet Take 2 mg by mouth 2 (two) times daily.        . hydrocortisone (ANUSOL-HC) 25 MG suppository Place 25 mg rectally 2 (two)  times daily as needed.       . Hydrocortisone Ace-Pramoxine (ANALPRAM E) 2.5-1 & 1 % KIT Place rectally. Apply two times a day to rectum as needed       . NON FORMULARY BETA SERON SHOTS. For Ms. Dr. Sandria Manly.       . nystatin (MYCOSTATIN)  cream as needed. Apply once daily to affected area as needed      . sertraline (ZOLOFT) 100 MG tablet Take 100 mg by mouth daily.              Review of Systems Review of Systems  Constitutional: Negative for fever, appetite change, fatigue and unexpected weight change.  Eyes: Negative for pain and visual disturbance.  Respiratory: Negative for cough and shortness of breath.  (feels like her lungs are full at times)  Cardiovascular: Negative.   Gastrointestinal: Negative for nausea, diarrhea and constipation.  Genitourinary: Negative for urgency and frequency.  Skin: Negative for pallor.  MSK pos for neck pain and also cramps in her feet  Neurological: Negative for weakness, light-headedness, numbness and headaches.  Hematological: Negative for adenopathy. Does not bruise/bleed easily.  Psychiatric/Behavioral: Negative for dysphoric mood. The patient is not nervous/anxious.          Objective:   Physical Exam  Constitutional: She appears well-developed and well-nourished. No distress.       overwt and fatigued appearing   HENT:  Head: Normocephalic and atraumatic.  Mouth/Throat: Oropharynx is clear and moist.  Eyes: Conjunctivae and EOM are normal. Pupils are equal, round, and reactive to light.  Neck: Neck supple. No JVD present. Carotid bruit is not present. No thyromegaly present.       Neck flex 20 deg and ext 20 deg with pain Pain to rotate L  Bilateral trapezius tenderness  Mild lower CS tenderness  No crepitice  Cardiovascular: Normal rate, regular rhythm and normal heart sounds.   Pulmonary/Chest: Effort normal and breath sounds normal.       Diffusely distant bs   Abdominal: Soft. Bowel sounds are normal.  Musculoskeletal: She exhibits tenderness. She exhibits no edema.       L shoulder is painful to move through full rom but pt is able to do it   Lymphadenopathy:    She has no cervical adenopathy.  Neurological: She is alert. She has normal strength and normal  reflexes. No cranial nerve deficit or sensory deficit. She displays a negative Romberg sign. Coordination and gait normal.  Skin: Skin is warm and dry. No rash noted. No erythema. No pallor.  Psychiatric:       Seems fatigued but with normal affect           Assessment & Plan:

## 2010-10-12 NOTE — Assessment & Plan Note (Signed)
Pt feels like her fusion is out of whack again  Cs x ray today May need to follow with MRI  Wants to see diff neurosurgeon  Also interested in MRI of brain due to fam hx brain tumors and her headaches

## 2010-10-12 NOTE — Assessment & Plan Note (Signed)
See assessment for smoking  cxr today  More prod cough lately

## 2010-10-14 ENCOUNTER — Telehealth: Payer: Self-pay

## 2010-10-14 DIAGNOSIS — M542 Cervicalgia: Secondary | ICD-10-CM

## 2010-10-14 NOTE — Telephone Encounter (Signed)
Message copied by Patience Musca on Wed Oct 14, 2010  6:16 PM ------      Message from: Roxy Manns A      Created: Wed Oct 14, 2010  8:20 AM       Please let pt know that her CS x ray is ok and fusion looks intact       I'm wondering if her pain is more muscular      I am leaning toward orthopedic consult rather than neurosurg at this time - let me know if that is ok       Also cxr ok -- I recommend starting the chantix to quit and update if cough worsens or persists

## 2010-10-14 NOTE — Telephone Encounter (Signed)
Patient notified as instructed by telephone. Pt is agreeable for orthopedic referral and will wait to hear from pt care coordinator about appt info. Pt can be reached at [phone]. Pt did say if fusion looks intact why is her pain there. I explained that you were wondering if could be muscular and you are referring her to specialist for further evaluation.

## 2010-10-15 NOTE — Telephone Encounter (Signed)
I will do ortho ref for neck pain

## 2010-10-19 ENCOUNTER — Ambulatory Visit: Payer: Medicare Other | Admitting: Family Medicine

## 2010-12-10 ENCOUNTER — Telehealth: Payer: Self-pay | Admitting: Family Medicine

## 2010-12-10 NOTE — Telephone Encounter (Signed)
She received a letter that she had a no show charge for a date of 8/13 and her appt was on 8/6.  Informed of follow up with billing to take care of it.

## 2011-01-08 ENCOUNTER — Other Ambulatory Visit: Payer: Self-pay

## 2011-01-08 NOTE — Telephone Encounter (Signed)
[facility] [employer] [facility] faxed form for diabetic supplies.  Spoke with pt and she needs new diabetic glucose monitor with battery and all supplies listed on form under step 6. Form is on your shelf in the in box.

## 2011-01-08 NOTE — Telephone Encounter (Signed)
Form is done and in IN box

## 2011-01-11 NOTE — Telephone Encounter (Signed)
Completed form faxed to [phone].

## 2011-02-05 ENCOUNTER — Other Ambulatory Visit: Payer: Self-pay

## 2011-02-05 MED ORDER — ROSUVASTATIN CALCIUM 20 MG PO TABS: 10.0000 mg | ORAL_TABLET | Freq: Every day | ORAL | Status: DC

## 2011-02-05 MED ORDER — OMEPRAZOLE 20 MG PO TBEC: DELAYED_RELEASE_TABLET | ORAL | Status: DC

## 2011-02-05 NOTE — Telephone Encounter (Signed)
Pt request refill on Crestor pt said requested [facility] to get [facility] weeks ago and has not received yet. Crestor 20 mg # 15 x 3 and Omeprazole 20 mg # 60 X 3.

## 2011-02-17 ENCOUNTER — Encounter: Payer: Self-pay | Admitting: Family Medicine

## 2011-02-17 ENCOUNTER — Ambulatory Visit (INDEPENDENT_AMBULATORY_CARE_PROVIDER_SITE_OTHER): Payer: Medicare Other | Admitting: Family Medicine

## 2011-02-17 VITALS — BP 100/70 | HR 88 | Temp 98.5°F | Resp 20 | Ht 61.5 in | Wt 191.8 lb

## 2011-02-17 DIAGNOSIS — J329 Chronic sinusitis, unspecified: Secondary | ICD-10-CM

## 2011-02-17 DIAGNOSIS — R0602 Shortness of breath: Secondary | ICD-10-CM

## 2011-02-17 DIAGNOSIS — R609 Edema, unspecified: Secondary | ICD-10-CM

## 2011-02-17 MED ORDER — AMOXICILLIN-POT CLAVULANATE 875-125 MG PO TABS: 1.0000 | ORAL_TABLET | Freq: Two times a day (BID) | ORAL | Status: DC

## 2011-02-17 NOTE — Progress Notes (Signed)
Subjective:    Patient ID: Karina [name], female    DOB: 16-Feb-1956, 55 y.o.   MRN: [id number]  HPI Here for many complaints/ problems today inc edema/ sob/ prod cough  Having hands and feet swelling - usual for her  Swelling in stomach- worse since her shoulder surgery (slow recovery but is doing better - able to raise her R arm half way) Wt is up 15 lb  Feels like she is very very full  Does not drink alcohol  Has not eaten very little overall  More short of breath for the past 2 weeks   Not sleeping well -- because her brain won't shut off when she goes to bed  ? If also some sob when she lies flat   Started productive cough -- with sinus symptoms 2 weeks ago Used px nasal spray and then saline  Now moved into lungs- prod cough with yellow sputum Just a little wheezing  Coughing a lot - but no chest pain or soreness   She does have a known hx of CAD   No nsaids   Patient Active Problem List  Diagnoses  . HYPOGLYCEMIA  . UNSPECIFIED VITAMIN D DEFICIENCY  . HYPERCHOLESTEROLEMIA  . TOBACCO USE  . ANXIETY, SITUATIONAL  . MULTIPLE SCLEROSIS  . NEUROPATHY-PERIPHERAL  . HEMORRHOIDS-EXTERNAL  . ALLERGIC RHINITIS  . LUNG NODULE  . GERD  . IRRITABLE BOWEL SYNDROME  . FIBROCYSTIC BREAST DISEASE  . OSTEOARTHRITIS, SHOULDER, RIGHT  . ARTHRALGIA  . BACK PAIN, LUMBAR, WITH RADICULOPATHY  . ROTATOR CUFF SYNDROME, RIGHT  . FOOT PAIN, BILATERAL  . SEIZURE DISORDER  . DIZZINESS OR VERTIGO  . SYMPTOM, EDEMA  . URINARY INCONTINENCE  . HYPERGLYCEMIA, BORDERLINE  . SYNCOPE, HX OF  . PERSONAL HX COLONIC POLYPS  . FOOT SURGERY, HX OF  . Other acquired absence of organ  . DILATION AND CURETTAGE, HX OF  . OTHER DISORDERS OF EYELID  . ABDOMINAL PAIN-MULTIPLE SITES  . Lump of skin  . Cough  . Neck pain  . Sinusitis  . Shortness of breath   Past Medical History  Diagnosis Date  . Vertigo   . GERD (gastroesophageal reflux disease)   . Seizure disorder   . Urinary  incontinence   . Plantar fasciitis   . Allergy   . Colon polyps 08/2008    Adenomatous  . Gastritis 08/2008    H. pylori  . Lung nodule   . Menopausal disorder   . Anxiety     [facility]or- Evalina Field  . Multiple sclerosis   . Allergic rhinitis    Past Surgical History  Procedure Date  . Cervical discectomy 2004    Anterior; Fusion C4-5, C5-6, C6-7, Dr. Donalee Citrin  . Tubal ligation   . Dilation and curettage of uterus   . Tonsillectomy   . Foot surgery   . Cholecystectomy   . Chest ct 11/2008    With small 4 mm nodule L lung base (rec re check in 1 year)  . Chest ct 07/2009    Re check chest CT - lung nodule stable  . Ct sinus ltd w/o cm 02/2009    negative  . Colonoscopy 08/2008    Polyps/ re check 5 yrs  . Esophagogastroduodenoscopy 08/2008    Erosive gastritis, h pylori (treated)   History  Substance Use Topics  . Smoking status: Former Smoker -- 0.8 packs/day    Types: Cigarettes    Quit date: 11/07/2010  . Smokeless tobacco: Not on  file   Comment: 1/2 to 1 pack per day   . Alcohol Use: No   Family History  Problem Relation Age of Onset  . Migraines Mother   . Heart attack Mother   . Coronary artery disease Mother   . Coronary artery disease Father     6 bypasses   . Colon cancer Maternal Grandfather 80  . Coronary artery disease Cousin   . Coronary artery disease Other     Grandmother  . Colon cancer Other 50    Aunt  . Lung cancer Other     Aunt   Allergies  Allergen Reactions  . Percocet (Oxycodone-Acetaminophen) Shortness Of Breath    Felt like going to pass out.  . Amitriptyline Hcl     REACTION: swelling and itching  . Atorvastatin     REACTION: elevated LFT's  . Clarithromycin     REACTION: reaction not known  . Duloxetine     REACTION: pain  . Levofloxacin     REACTION: Rash  . Pregabalin     REACTION: LE swelling  . Pseudoephedrine     REACTION: legs hurt   Current Outpatient Prescriptions on File Prior to Visit  Medication Sig  Dispense Refill  . albuterol (PROVENTIL HFA) 108 (90 BASE) MCG/ACT inhaler 2 puffs up to every 4 hours as needed for wheezing      . bifidobacterium infantis (ALIGN) capsule Take 1 capsule by mouth daily.        . cyclobenzaprine (FLEXERIL) 10 MG tablet Take 10 mg by mouth at bedtime.       Marland Kitchen dextromethorphan-guaiFENesin (MUCINEX DM) 30-600 MG per 12 hr tablet Take 1 tablet by mouth every 12 (twelve) hours. OTC as directed       . fluticasone (FLONASE) 50 MCG/ACT nasal spray 2 sprays in each nostril daily as needed      . gabapentin (NEURONTIN) 300 MG capsule Take 600 mg by mouth at bedtime.       . rosuvastatin (CRESTOR) 20 MG tablet Take 0.5 tablets (10 mg total) by mouth daily.  15 tablet  3  . ALPRAZolam (XANAX) 0.5 MG tablet 1 by mouth up to two times a day as needed for severe anxiety- only use if necessary      . sertraline (ZOLOFT) 100 MG tablet Take 100 mg by mouth daily.              Review of Systems Review of Systems  Constitutional: Negative for fever, appetite change, fatigue and unexpected weight change.  Eyes: Negative for pain and visual disturbance.  ENT pos for facial pain and purulent nasal discharge  Respiratory: Negative for wheezing or stridor   Cardiovascular: Negative for cp or palpitations   pos for edema  Gastrointestinal: Negative for nausea, diarrhea and constipation.  Genitourinary: Negative for urgency and frequency.  Skin: Negative for pallor or rash   Neurological: Negative for weakness, light-headedness, numbness and headaches.  Hematological: Negative for adenopathy. Does not bruise/bleed easily.  Psychiatric/Behavioral: Negative for dysphoric mood. The patient is generally anxious         Objective:   Physical Exam  Constitutional: She appears well-developed and well-nourished. No distress.       overwt and well appearing   HENT:  Head: Normocephalic and atraumatic.  Right Ear: External ear normal.  Left Ear: External ear normal.    Mouth/Throat: Oropharynx is clear and moist.       Nares are injected and congested   Postnasal drip  noted  Frontal and maxillary sinus tenderness   Eyes: Conjunctivae and EOM are normal. Pupils are equal, round, and reactive to light. No scleral icterus.  Neck: Normal range of motion. Neck supple. No JVD present. Carotid bruit is not present. No thyromegaly present.  Cardiovascular: Normal rate, regular rhythm, normal heart sounds and intact distal pulses.  Exam reveals no gallop.   Pulmonary/Chest: Effort normal and breath sounds normal. No respiratory distress. She has no wheezes.       Diffusely distant bs  No crackles at all   Abdominal: Soft. Bowel sounds are normal. She exhibits no shifting dullness, no distension, no fluid wave, no abdominal bruit, no ascites and no mass. There is no tenderness.  Musculoskeletal: Normal range of motion. She exhibits edema. She exhibits no tenderness.       Trace edema in ankles - not pitting  ? Perhaps hands are puffy- is vague and difficult to tell  Lymphadenopathy:    She has no cervical adenopathy.  Neurological: She is alert. She has normal reflexes. No cranial nerve deficit. She exhibits normal muscle tone. Coordination normal.  Skin: Skin is warm and dry. No rash noted. No erythema. No pallor.  Psychiatric: She has a normal mood and affect.          Assessment & Plan:

## 2011-02-17 NOTE — Patient Instructions (Signed)
For swelling- doing some labs today  Avoid salt and drink enough water  We will likely try you on a fluid pill depending on your labs  Take the augmentin for sinus infection as directed- I sent it to your pharmacy

## 2011-02-18 ENCOUNTER — Telehealth: Payer: Self-pay | Admitting: Family Medicine

## 2011-02-18 LAB — RENAL FUNCTION PANEL
Albumin: 4.2 g/dL (ref 3.5–5.2)
BUN: 10 mg/dL (ref 6–23)
CO2: 29 mEq/L (ref 19–32)
Calcium: 10 mg/dL (ref 8.4–10.5)
Chloride: 103 mEq/L (ref 96–112)
Creatinine, Ser: 1.1 mg/dL (ref 0.4–1.2)
GFR: 58.25 mL/min — ABNORMAL LOW (ref >60.00)
Glucose, Bld: 88 mg/dL (ref 70–99)
Phosphorus: 3.7 mg/dL (ref 2.3–4.6)
Potassium: 4.9 mEq/L (ref 3.5–5.1)
Sodium: 141 mEq/L (ref 135–145)

## 2011-02-18 LAB — BRAIN NATRIURETIC PEPTIDE: Pro B Natriuretic peptide (BNP): 47 pg/mL (ref 0.0–100.0)

## 2011-02-18 MED ORDER — FUROSEMIDE 20 MG PO TABS: 20.0000 mg | ORAL_TABLET | Freq: Every day | ORAL | Status: DC

## 2011-02-18 NOTE — Telephone Encounter (Signed)
Labs look ok  Test for heart failure negative I want to start her on lasix for swelling  Px written for call in   Then f/u with me around 2 weeks for visit and labs If side eff like dizziness (from low bp) stop med and update me

## 2011-02-18 NOTE — Assessment & Plan Note (Signed)
Normal and reassuring exam Some wt gain since shoulder surgery - but no signs of chf and no pitting edema ? If poss deconditioning and wt related Lab today BNP and update

## 2011-02-18 NOTE — Assessment & Plan Note (Signed)
Suspect this is cause of the cough  Will tx with augmentin and sympt care  inst to update if worse or not improving

## 2011-02-18 NOTE — Assessment & Plan Note (Signed)
Worse since shoulder surgery with 15 lb wt gain - ? If some fluid retention No s/s of chf Check bnp and renal prof Consider a course of diuretic and then f/u if labs are ok  Adv to avoid salt and drink more water and work on wt loss

## 2011-02-19 NOTE — Telephone Encounter (Signed)
Left vm for pt to callback 

## 2011-02-22 NOTE — Telephone Encounter (Signed)
Patient notified as instructed by telephone. Medication phoned to [facility] pharmacy as instructed.Pt scheduled f/u appt on 03/14/10 at 11:30am.(Pt will be out of town for the 2 wk f/u).

## 2011-02-27 ENCOUNTER — Emergency Department (HOSPITAL_COMMUNITY)
Admission: EM | Admit: 2011-02-27 | Discharge: 2011-02-27 | Discharge status: Against Medical Advice | Payer: Medicare Other | Attending: Emergency Medicine | Admitting: Emergency Medicine

## 2011-02-27 ENCOUNTER — Encounter (HOSPITAL_COMMUNITY): Payer: Self-pay

## 2011-02-27 DIAGNOSIS — R220 Localized swelling, mass and lump, head: Secondary | ICD-10-CM | POA: Insufficient documentation

## 2011-02-27 DIAGNOSIS — R221 Localized swelling, mass and lump, neck: Secondary | ICD-10-CM | POA: Insufficient documentation

## 2011-02-27 DIAGNOSIS — R51 Headache: Secondary | ICD-10-CM | POA: Insufficient documentation

## 2011-02-27 NOTE — ED Notes (Signed)
Pt states that she has spoken to neurologist and was advised that it may be GERD and to lie down at night w/ pillows propped under her head. I am unable to palpate any swelling and pt denies tenderness w/ palpation

## 2011-02-27 NOTE — ED Notes (Signed)
Called patient in general waiting, pediatric waiting, and triage waiting w/o answer

## 2011-03-15 ENCOUNTER — Encounter: Payer: Self-pay | Admitting: Family Medicine

## 2011-03-15 ENCOUNTER — Ambulatory Visit (INDEPENDENT_AMBULATORY_CARE_PROVIDER_SITE_OTHER): Payer: Medicare Other | Admitting: Family Medicine

## 2011-03-15 ENCOUNTER — Ambulatory Visit (INDEPENDENT_AMBULATORY_CARE_PROVIDER_SITE_OTHER)
Admission: RE | Admit: 2011-03-15 | Discharge: 2011-03-15 | Disposition: A | Discharge status: Home / Self Care | Payer: Medicare Other | Source: Ambulatory Visit | Attending: Family Medicine | Admitting: Family Medicine

## 2011-03-15 VITALS — BP 118/82 | HR 80 | Temp 98.4°F | Ht 61.5 in | Wt 191.0 lb

## 2011-03-15 DIAGNOSIS — R059 Cough, unspecified: Secondary | ICD-10-CM

## 2011-03-15 DIAGNOSIS — F17200 Nicotine dependence, unspecified, uncomplicated: Secondary | ICD-10-CM

## 2011-03-15 DIAGNOSIS — R05 Cough: Secondary | ICD-10-CM

## 2011-03-15 DIAGNOSIS — R609 Edema, unspecified: Secondary | ICD-10-CM

## 2011-03-15 MED ORDER — AZITHROMYCIN 250 MG PO TABS: ORAL_TABLET | ORAL | Status: AC

## 2011-03-15 NOTE — Assessment & Plan Note (Signed)
Nl labs and little to no imp with lasix Will stop that ? Perhaps puffiness from neurontin -- no pitting on exam Will continue to follow Nl BNP cxr today also

## 2011-03-15 NOTE — Assessment & Plan Note (Signed)
Pt will try chantix again which worked well  .5 bid for 1 week and adv to 1 mg bid if well tolerated  Disc in detail risks of smoking and possible outcomes including copd, vascular/ heart disease, cancer , respiratory and sinus infections  Pt voices understanding

## 2011-03-15 NOTE — Patient Instructions (Addendum)
Re start your chantix 1 mg- start with 1/2 pill twice daily for 1 week and if doing well go to one pill twice a day  Chest xray today  Take the zpak as directed  Also try zyrtec (antihistamine ) over the counter 10 mg daily for watery eyes and nasal symptoms  (take it at night) Good luck quitting smoking  Stop the lasix/ furosemide since it is not working

## 2011-03-15 NOTE — Assessment & Plan Note (Signed)
Ongoing in smoker after a sinus infx  Cover with zithromax in light of prod cough/ length of illness cxr today and report  Disc symptomatic care - see instructions on AVS  Update if not starting to improve in a week or if worsening

## 2011-03-15 NOTE — Progress Notes (Signed)
Subjective:    Patient ID: Karina [name], female    DOB: 12/10/1955, 56 y.o.   MRN: [id number]  HPI Here for f/u of edema with sob/ also cough with recent sinusitis  Wt is stable Good bp  Given lasix 20 for edema following nl labs (incl BNP) This does not seem to help much or make her urinate much more  Stays swollen  Is still sob - no change  Still on gabapentin   Has a spot on eye - wants to check Feels swollen in face too   Glucose ok   Was tx for sinus infx with augmentin - and is still stuffy Blowing out green and yellow stuff Now prod cough / yellow phlegm Is a smoker   (quit once for 3 months -- and gained wt )  chantix - really helped -- needs starter pack or can use her 1 mg pill  Just a little headache- no other side eff  No fever   Patient Active Problem List  Diagnoses  . HYPOGLYCEMIA  . UNSPECIFIED VITAMIN D DEFICIENCY  . HYPERCHOLESTEROLEMIA  . Smoker  . ANXIETY, SITUATIONAL  . MULTIPLE SCLEROSIS  . NEUROPATHY-PERIPHERAL  . HEMORRHOIDS-EXTERNAL  . ALLERGIC RHINITIS  . LUNG NODULE  . GERD  . IRRITABLE BOWEL SYNDROME  . FIBROCYSTIC BREAST DISEASE  . OSTEOARTHRITIS, SHOULDER, RIGHT  . ARTHRALGIA  . BACK PAIN, LUMBAR, WITH RADICULOPATHY  . ROTATOR CUFF SYNDROME, RIGHT  . FOOT PAIN, BILATERAL  . SEIZURE DISORDER  . DIZZINESS OR VERTIGO  . SYMPTOM, EDEMA  . URINARY INCONTINENCE  . HYPERGLYCEMIA, BORDERLINE  . SYNCOPE, HX OF  . PERSONAL HX COLONIC POLYPS  . FOOT SURGERY, HX OF  . Other acquired absence of organ  . DILATION AND CURETTAGE, HX OF  . OTHER DISORDERS OF EYELID  . ABDOMINAL PAIN-MULTIPLE SITES  . Lump of skin  . Cough  . Neck pain  . Sinusitis  . Shortness of breath   Past Medical History  Diagnosis Date  . Vertigo   . GERD (gastroesophageal reflux disease)   . Seizure disorder   . Urinary incontinence   . Plantar fasciitis   . Allergy   . Colon polyps 08/2008    Adenomatous  . Gastritis 08/2008    H. pylori  . Lung  nodule   . Menopausal disorder   . Anxiety     Councelor- Evalina Field  . Multiple sclerosis   . Allergic rhinitis    Past Surgical History  Procedure Date  . Cervical discectomy 2004    Anterior; Fusion C4-5, C5-6, C6-7, Dr. Donalee Citrin  . Tubal ligation   . Dilation and curettage of uterus   . Tonsillectomy   . Foot surgery   . Cholecystectomy   . Chest ct 11/2008    With small 4 mm nodule L lung base (rec re check in 1 year)  . Chest ct 07/2009    Re check chest CT - lung nodule stable  . Ct sinus ltd w/o cm 02/2009    negative  . Colonoscopy 08/2008    Polyps/ re check 5 yrs  . Esophagogastroduodenoscopy 08/2008    Erosive gastritis, h pylori (treated)   History  Substance Use Topics  . Smoking status: Current Everyday Smoker -- 0.8 packs/day    Types: Cigarettes    Last Attempt to Quit: 11/07/2010  . Smokeless tobacco: Not on file   Comment: 1/2 to 1 pack per day   . Alcohol Use: No  Family History  Problem Relation Age of Onset  . Migraines Mother   . Heart attack Mother   . Coronary artery disease Mother   . Coronary artery disease Father     6 bypasses   . Colon cancer Maternal Grandfather 80  . Coronary artery disease Cousin   . Coronary artery disease Other     Grandmother  . Colon cancer Other 50    Aunt  . Lung cancer Other     Aunt   Allergies  Allergen Reactions  . Percocet (Oxycodone-Acetaminophen) Shortness Of Breath    Felt like going to pass out.  . Amitriptyline Hcl     REACTION: swelling and itching  . Atorvastatin     REACTION: elevated LFT's  . Clarithromycin     REACTION: reaction not known  . Duloxetine     REACTION: pain  . Levofloxacin     REACTION: Rash  . Pregabalin     REACTION: LE swelling  . Pseudoephedrine     REACTION: legs hurt   Current Outpatient Prescriptions on File Prior to Visit  Medication Sig Dispense Refill  . albuterol (PROVENTIL HFA) 108 (90 BASE) MCG/ACT inhaler 2 puffs up to every 4 hours as needed  for wheezing      . fluticasone (FLONASE) 50 MCG/ACT nasal spray Place 2 sprays into the nose daily. For nasal irritation      . gabapentin (NEURONTIN) 300 MG capsule Take 600 mg by mouth at bedtime.       Marland Kitchen omeprazole (PRILOSEC) 40 MG capsule Take 40 mg by mouth daily.        . rosuvastatin (CRESTOR) 20 MG tablet Take 10 mg by mouth daily.        Marland Kitchen ALPRAZolam (XANAX) 0.5 MG tablet Take 0.5 mg by mouth 2 (two) times daily as needed. 1 by mouth up to two times a day as needed for severe anxiety- only use if necessary      . cyclobenzaprine (FLEXERIL) 10 MG tablet Take 10 mg by mouth at bedtime.         Review of Systems Review of Systems  Constitutional: Negative for fever, appetite change,  and unexpected weight change. pos for fatigue and general malaise Eyes: Negative for pain and visual disturbance.  ENT pos for nasal cong and facial pain (improved), no ear or throat pain Respiratory: pos for cough/ sob/ neg for wheezing   Cardiovascular: Negative for cp or palpitations   pos for swelling all over  Gastrointestinal: Negative for nausea, diarrhea and constipation.  Genitourinary: Negative for urgency and frequency.  Skin: Negative for pallor or rash   Neurological: Negative for weakness, light-headedness, numbness and headaches.  Hematological: Negative for adenopathy. Does not bruise/bleed easily.  Psychiatric/Behavioral: Negative for dysphoric mood. The patient stays anxious         Objective:   Physical Exam  Constitutional: She appears well-developed and well-nourished. No distress.  HENT:  Head: Normocephalic and atraumatic.  Right Ear: External ear normal.  Left Ear: External ear normal.  Mouth/Throat: Oropharynx is clear and moist.       Nares are injected and congested  Very slight maxillary sinus tenderness Some clear post nasal drip  Eyes: Conjunctivae and EOM are normal. Pupils are equal, round, and reactive to light. Right eye exhibits no discharge. Left eye exhibits  no discharge.  Neck: Normal range of motion. Neck supple. No JVD present. Carotid bruit is not present. No thyromegaly present.  Cardiovascular: Normal rate, regular rhythm,  normal heart sounds and intact distal pulses.  Exam reveals no gallop.   Pulmonary/Chest: Effort normal and breath sounds normal. No respiratory distress. She has no wheezes. She has no rales. She exhibits no tenderness.       Diffusely distant bs  Wheeze on forced exp only  Harsh throughout  On prolonged exp phase No crackles  Abdominal: Soft. Bowel sounds are normal. She exhibits no distension, no abdominal bruit and no mass. There is no tenderness.  Musculoskeletal: Normal range of motion. She exhibits no edema and no tenderness.       I do not appreciate edema or pitting in extremeties/ abd or face Pt disagress and thinks she appears swollen  Lymphadenopathy:    She has no cervical adenopathy.  Neurological: She is alert. She has normal reflexes. She exhibits normal muscle tone.       Baseline mild head tremor   Skin: Skin is warm and dry. No rash noted. No erythema. No pallor.  Psychiatric:       Mildly anxious - but pleasant  Nl eye contact and comm skills           Assessment & Plan:

## 2011-05-27 ENCOUNTER — Encounter: Payer: Self-pay | Admitting: Family Medicine

## 2011-05-27 ENCOUNTER — Ambulatory Visit (INDEPENDENT_AMBULATORY_CARE_PROVIDER_SITE_OTHER): Payer: Medicare Other | Admitting: Family Medicine

## 2011-05-27 VITALS — BP 110/80 | HR 83 | Temp 97.9°F | Ht 61.5 in | Wt 192.2 lb

## 2011-05-27 DIAGNOSIS — F438 Other reactions to severe stress: Secondary | ICD-10-CM

## 2011-05-27 DIAGNOSIS — F17200 Nicotine dependence, unspecified, uncomplicated: Secondary | ICD-10-CM

## 2011-05-27 DIAGNOSIS — F4389 Other reactions to severe stress: Secondary | ICD-10-CM

## 2011-05-27 MED ORDER — SERTRALINE HCL 100 MG PO TABS: 100.0000 mg | ORAL_TABLET | Freq: Every day | ORAL | Status: DC

## 2011-05-27 MED ORDER — ALPRAZOLAM 0.5 MG PO TABS
0.5000 mg | ORAL_TABLET | Freq: Two times a day (BID) | ORAL | Status: DC | PRN
Start: 2011-05-27 — End: 2011-09-23

## 2011-05-27 MED ORDER — VARENICLINE TARTRATE 1 MG PO TABS: 1.0000 mg | ORAL_TABLET | Freq: Two times a day (BID) | ORAL | Status: AC

## 2011-05-27 MED ORDER — VARENICLINE TARTRATE 0.5 MG X 11 & 1 MG X 42 PO MISC: ORAL | Status: AC

## 2011-05-27 NOTE — Assessment & Plan Note (Signed)
Pt is again dedicated to trying to quit  Given px for chantix which helped in past Disc poss side eff  Will fill when able to afford  Disc in detail risks of smoking and possible outcomes including copd, vascular/ heart disease, cancer , respiratory and sinus infections  Pt voices understanding

## 2011-05-27 NOTE — Patient Instructions (Signed)
Go ahead and start back on zoloft (if side effects or problems - let me know )  Call back for counseling referral as needed  Start looking for nursing home if necessary  Use xanax as needed Here is px for chantix - fill it when ready and good luck Follow up with me in about 3 months

## 2011-05-27 NOTE — Assessment & Plan Note (Signed)
Pt having enormous anxiety trying to care for disabled father with dementia and facing nursing home placement Very difficult for her  Having some panic attacks- disc symptoms/ coping skills/ tx opt and poss side eff in detail Is going to make decision and then may return to [facility] for counseling Also re start zoloft 100-has helped in past  Xanax prn -refilled for emergency use  Disc coping skills Not much support  Will remain in contact and also f/u 3 mo  >25 min spent with face to face with patient, >50% counseling and/or coordinating care

## 2011-05-27 NOTE — Progress Notes (Signed)
Subjective:    Patient ID: Karina [name], female    DOB: [birth year], 56 y.o.   MRN: [id number]  HPI Here for anxiety  Lots of stress  Her father requires 24 hour care now  His home care agency was not helpful / and new agency has a difficult case worker to deal with  Not able to keep up with his care completely This is really hard on her  Will end up having to put him in home  He fell on the floor and she messed up shoulder trying to pick him up Her house is too small for his wheelchair and his home is too small too    He is wheelchair bound  Dementia - but still understands some things    Got so anxious over the weekend - had some panic symptoms/ sob/ dizziness/cp  Better now   Had a friend come help with her father  As soon as she got some relief - her symtoms improved --is much better  Had xanax in the past  Was on zoloft for a while -- was helpful    Only had one seizure in the distant past from low sugar   Is really trying to quit smoking  Also wants to loose weight  Needs chantix px again to try- worked in past  No sob or cough  Patient Active Problem List  Diagnoses  . HYPOGLYCEMIA  . UNSPECIFIED VITAMIN D DEFICIENCY  . HYPERCHOLESTEROLEMIA  . Smoker  . ANXIETY, SITUATIONAL  . MULTIPLE SCLEROSIS  . NEUROPATHY-PERIPHERAL  . HEMORRHOIDS-EXTERNAL  . ALLERGIC RHINITIS  . LUNG NODULE  . GERD  . IRRITABLE BOWEL SYNDROME  . FIBROCYSTIC BREAST DISEASE  . OSTEOARTHRITIS, SHOULDER, RIGHT  . ARTHRALGIA  . BACK PAIN, LUMBAR, WITH RADICULOPATHY  . ROTATOR CUFF SYNDROME, RIGHT  . FOOT PAIN, BILATERAL  . SEIZURE DISORDER  . DIZZINESS OR VERTIGO  . SYMPTOM, EDEMA  . URINARY INCONTINENCE  . HYPERGLYCEMIA, BORDERLINE  . SYNCOPE, HX OF  . PERSONAL HX COLONIC POLYPS  . FOOT SURGERY, HX OF  . Other acquired absence of organ  . DILATION AND CURETTAGE, HX OF  . OTHER DISORDERS OF EYELID  . ABDOMINAL PAIN-MULTIPLE SITES  . Neck pain   Past Medical History    Diagnosis Date  . Vertigo   . GERD (gastroesophageal reflux disease)   . Seizure disorder   . Urinary incontinence   . Plantar fasciitis   . Allergy   . Colon polyps 08/2008    Adenomatous  . Gastritis 08/2008    H. pylori  . Lung nodule   . Menopausal disorder   . Anxiety     Councelor- Evalina Field  . Multiple sclerosis   . Allergic rhinitis    Past Surgical History  Procedure Date  . Cervical discectomy 2004    Anterior; Fusion C4-5, C5-6, C6-7, Dr. Donalee Citrin  . Tubal ligation   . Dilation and curettage of uterus   . Tonsillectomy   . Foot surgery   . Cholecystectomy   . Chest ct 11/2008    With small 4 mm nodule L lung base (rec re check in 1 year)  . Chest ct 07/2009    Re check chest CT - lung nodule stable  . Ct sinus ltd w/o cm 02/2009    negative  . Colonoscopy 08/2008    Polyps/ re check 5 yrs  . Esophagogastroduodenoscopy 08/2008    Erosive gastritis, h pylori (treated)   History  Substance  Use Topics  . Smoking status: Current Everyday Smoker -- 0.8 packs/day    Types: Cigarettes    Last Attempt to Quit: 11/07/2010  . Smokeless tobacco: Not on file   Comment: 1/2 to 1 pack per day   . Alcohol Use: No   Family History  Problem Relation Age of Onset  . Migraines Mother   . Heart attack Mother   . Coronary artery disease Mother   . Coronary artery disease Father     6 bypasses   . Colon cancer Maternal Grandfather 80  . Coronary artery disease Cousin   . Coronary artery disease Other     Grandmother  . Colon cancer Other 50    Aunt  . Lung cancer Other     Aunt   Allergies  Allergen Reactions  . Percocet (Oxycodone-Acetaminophen) Shortness Of Breath    Felt like going to pass out.  . Amitriptyline Hcl     REACTION: swelling and itching  . Atorvastatin     REACTION: elevated LFT's  . Clarithromycin     REACTION: reaction not known  . Duloxetine     REACTION: pain  . Levofloxacin     REACTION: Rash  . Pregabalin     REACTION: LE  swelling  . Pseudoephedrine     REACTION: legs hurt   Current Outpatient Prescriptions on File Prior to Visit  Medication Sig Dispense Refill  . albuterol (PROVENTIL HFA) 108 (90 BASE) MCG/ACT inhaler 2 puffs up to every 4 hours as needed for wheezing      . cyclobenzaprine (FLEXERIL) 10 MG tablet Take 10 mg by mouth at bedtime.       . fluticasone (FLONASE) 50 MCG/ACT nasal spray Place 2 sprays into the nose daily. For nasal irritation      . gabapentin (NEURONTIN) 300 MG capsule Take 600 mg by mouth at bedtime.       Marland Kitchen omeprazole (PRILOSEC) 40 MG capsule Take 40 mg by mouth daily.        . rosuvastatin (CRESTOR) 20 MG tablet Take 10 mg by mouth daily.              Review of Systems     Objective:   Physical Exam  Constitutional: She appears well-developed and well-nourished. No distress.       overwt and well appearing   HENT:  Head: Normocephalic and atraumatic.  Mouth/Throat: Oropharynx is clear and moist.  Eyes: Conjunctivae and EOM are normal. Pupils are equal, round, and reactive to light. Right eye exhibits no discharge. Left eye exhibits no discharge. No scleral icterus.  Neck: Normal range of motion. Neck supple. No JVD present. Carotid bruit is not present. No thyromegaly present.  Cardiovascular: Normal rate, regular rhythm, normal heart sounds and intact distal pulses.  Exam reveals no gallop.   Pulmonary/Chest: Effort normal and breath sounds normal. No respiratory distress. She has no wheezes. She exhibits no tenderness.  Musculoskeletal: Normal range of motion. She exhibits no edema.  Lymphadenopathy:    She has no cervical adenopathy.  Neurological: She is alert. She has normal reflexes. No cranial nerve deficit. She exhibits normal muscle tone. Coordination normal.       No tremor   Skin: Skin is warm and dry. No rash noted. No erythema. No pallor.  Psychiatric: Her speech is normal and behavior is normal. Judgment and thought content normal. Her mood appears  anxious. Her affect is not blunt, not labile and not inappropriate. She exhibits a depressed  mood.       Is generally anxious and sometimes tearful when talking about her father  Answers questions app Good eye contact and comm skills          Assessment & Plan:

## 2011-07-15 ENCOUNTER — Other Ambulatory Visit: Payer: Self-pay | Admitting: Neurology

## 2011-07-15 DIAGNOSIS — G35 Multiple sclerosis: Secondary | ICD-10-CM

## 2011-07-21 ENCOUNTER — Ambulatory Visit
Admission: RE | Admit: 2011-07-21 | Discharge: 2011-07-21 | Disposition: A | Discharge status: Home / Self Care | Payer: Medicare Other | Source: Ambulatory Visit | Attending: Neurology | Admitting: Neurology

## 2011-07-21 DIAGNOSIS — G35D Multiple sclerosis, unspecified: Secondary | ICD-10-CM

## 2011-07-21 DIAGNOSIS — G35 Multiple sclerosis: Secondary | ICD-10-CM

## 2011-07-21 MED ORDER — GADOBENATE DIMEGLUMINE 529 MG/ML IV SOLN
18.0000 mL | Freq: Once | INTRAVENOUS | Status: AC | PRN
  Administered 2011-07-21: 18 mL via INTRAVENOUS

## 2011-07-31 ENCOUNTER — Other Ambulatory Visit: Payer: Self-pay | Admitting: Family Medicine

## 2011-08-24 ENCOUNTER — Ambulatory Visit (INDEPENDENT_AMBULATORY_CARE_PROVIDER_SITE_OTHER): Payer: Medicare Other | Admitting: Family Medicine

## 2011-08-24 ENCOUNTER — Ambulatory Visit (INDEPENDENT_AMBULATORY_CARE_PROVIDER_SITE_OTHER)
Admission: RE | Admit: 2011-08-24 | Discharge: 2011-08-24 | Disposition: A | Discharge status: Home / Self Care | Payer: Medicare Other | Source: Ambulatory Visit | Attending: Family Medicine | Admitting: Family Medicine

## 2011-08-24 ENCOUNTER — Encounter: Payer: Self-pay | Admitting: Family Medicine

## 2011-08-24 VITALS — BP 106/72 | HR 78 | Temp 97.9°F | Ht 62.5 in | Wt 187.8 lb

## 2011-08-24 DIAGNOSIS — R1013 Epigastric pain: Secondary | ICD-10-CM

## 2011-08-24 DIAGNOSIS — R0789 Other chest pain: Secondary | ICD-10-CM

## 2011-08-24 DIAGNOSIS — R071 Chest pain on breathing: Secondary | ICD-10-CM

## 2011-08-24 DIAGNOSIS — F17200 Nicotine dependence, unspecified, uncomplicated: Secondary | ICD-10-CM

## 2011-08-24 DIAGNOSIS — R52 Pain, unspecified: Secondary | ICD-10-CM

## 2011-08-24 NOTE — Assessment & Plan Note (Signed)
With rib tenderness in a smoker  cxr and rib films today Counseled on smoking cessation

## 2011-08-24 NOTE — Progress Notes (Signed)
Subjective:    Patient ID: Karina [name], female    DOB: Nov 24, 1955, 56 y.o.   MRN: [id number]  HPI 56 yo female with complex med hx incl h pylori gastritis and IBS presents with acute nausea/ diarrhea/ bloating/L flank pain   Is nauseated - not vomiting  Bloated  Diarrhea = very watery - "everything she eats goes right through thru her "  For 2 months  Pain is getting worse - above umbilicus and on top of it  No fever or chills  No blood in stool   L flank/ rib pain worse with movement  Worse to cough  Coughs up white mucous Still smoking     Chemistry      Component Value Date/Time   NA 141 02/17/2011 1701   K 4.9 02/17/2011 1701   CL 103 02/17/2011 1701   CO2 29 02/17/2011 1701   BUN 10 02/17/2011 1701   CREATININE 1.1 02/17/2011 1701      Component Value Date/Time   CALCIUM 10.0 02/17/2011 1701   ALKPHOS 72 07/27/2007 1040   AST 20 06/04/2010 0847   ALT 16 06/04/2010 0847   BILITOT 0.9 07/27/2007 1040      Lab Results  Component Value Date   WBC 8.0 03/19/2009   HGB 15.3* 03/19/2009   HCT 44.1 03/19/2009   MCV 97.3 03/19/2009   PLT 156 03/19/2009     Patient Active Problem List  Diagnosis  . HYPOGLYCEMIA  . UNSPECIFIED VITAMIN D DEFICIENCY  . HYPERCHOLESTEROLEMIA  . Smoker  . ANXIETY, SITUATIONAL  . MULTIPLE SCLEROSIS  . NEUROPATHY-PERIPHERAL  . HEMORRHOIDS-EXTERNAL  . ALLERGIC RHINITIS  . LUNG NODULE  . GERD  . IRRITABLE BOWEL SYNDROME  . FIBROCYSTIC BREAST DISEASE  . OSTEOARTHRITIS, SHOULDER, RIGHT  . ARTHRALGIA  . BACK PAIN, LUMBAR, WITH RADICULOPATHY  . ROTATOR CUFF SYNDROME, RIGHT  . FOOT PAIN, BILATERAL  . SEIZURE DISORDER  . DIZZINESS OR VERTIGO  . SYMPTOM, EDEMA  . URINARY INCONTINENCE  . HYPERGLYCEMIA, BORDERLINE  . SYNCOPE, HX OF  . PERSONAL HX COLONIC POLYPS  . FOOT SURGERY, HX OF  . Other acquired absence of organ  . DILATION AND CURETTAGE, HX OF  . OTHER DISORDERS OF EYELID  . ABDOMINAL PAIN-MULTIPLE SITES  . Neck pain   Past  Medical History  Diagnosis Date  . Vertigo   . GERD (gastroesophageal reflux disease)   . Seizure disorder   . Urinary incontinence   . Plantar fasciitis   . Allergy   . Colon polyps 08/2008    Adenomatous  . Gastritis 08/2008    H. pylori  . Lung nodule   . Menopausal disorder   . Anxiety     Councelor- Evalina Field  . Multiple sclerosis   . Allergic rhinitis    Past Surgical History  Procedure Date  . Cervical discectomy 2004    Anterior; Fusion C4-5, C5-6, C6-7, Dr. Donalee Citrin  . Tubal ligation   . Dilation and curettage of uterus   . Tonsillectomy   . Foot surgery   . Cholecystectomy   . Chest ct 11/2008    With small 4 mm nodule L lung base (rec re check in 1 year)  . Chest ct 07/2009    Re check chest CT - lung nodule stable  . Ct sinus ltd w/o cm 02/2009    negative  . Colonoscopy 08/2008    Polyps/ re check 5 yrs  . Esophagogastroduodenoscopy 08/2008    Erosive gastritis, h  pylori (treated)   History  Substance Use Topics  . Smoking status: Current Everyday Smoker -- 0.8 packs/day    Types: Cigarettes    Last Attempt to Quit: 11/07/2010  . Smokeless tobacco: Not on file   Comment: 1/2 to 1 pack per day   . Alcohol Use: No   Family History  Problem Relation Age of Onset  . Migraines Mother   . Heart attack Mother   . Coronary artery disease Mother   . Coronary artery disease Father     6 bypasses   . Colon cancer Maternal Grandfather 80  . Coronary artery disease Cousin   . Coronary artery disease Other     Grandmother  . Colon cancer Other 50    Aunt  . Lung cancer Other     Aunt   Allergies  Allergen Reactions  . Percocet (Oxycodone-Acetaminophen) Shortness Of Breath    Felt like going to pass out.  . Amitriptyline Hcl     REACTION: swelling and itching  . Atorvastatin     REACTION: elevated LFT's  . Clarithromycin     REACTION: reaction not known  . Duloxetine     REACTION: pain  . Levofloxacin     REACTION: Rash  . Pregabalin      REACTION: LE swelling  . Pseudoephedrine     REACTION: legs hurt   Current Outpatient Prescriptions on File Prior to Visit  Medication Sig Dispense Refill  . albuterol (PROVENTIL HFA) 108 (90 BASE) MCG/ACT inhaler 2 puffs up to every 4 hours as needed for wheezing      . ALPRAZolam (XANAX) 0.5 MG tablet Take 1 tablet (0.5 mg total) by mouth 2 (two) times daily as needed. 1 by mouth up to two times a day as needed for severe anxiety- only use if necessary  60 tablet  0  . CRESTOR 20 MG tablet TAKE 1/2 TABLET BY MOUTH DAILY  15 tablet  11  . cyclobenzaprine (FLEXERIL) 10 MG tablet Take 10 mg by mouth at bedtime.       . fluticasone (FLONASE) 50 MCG/ACT nasal spray Place 2 sprays into the nose daily. For nasal irritation      . gabapentin (NEURONTIN) 300 MG capsule Take 600 mg by mouth at bedtime.       Marland Kitchen omeprazole (PRILOSEC) 40 MG capsule Take 40 mg by mouth daily.        . varenicline (CHANTIX STARTING MONTH PAK) 0.5 MG X 11 & 1 MG X 42 tablet Take one 0.5 mg tablet by mouth once daily for 3 days, then increase to one 0.5 mg tablet twice daily for 4 days, then increase to one 1 mg tablet twice daily.  53 tablet  0  . sertraline (ZOLOFT) 100 MG tablet Take 1 tablet (100 mg total) by mouth daily.  30 tablet  3  . varenicline (CHANTIX) 1 MG tablet Take 1 tablet (1 mg total) by mouth 2 (two) times daily. After finishing starter pack  60 tablet  3  . DISCONTD: rosuvastatin (CRESTOR) 20 MG tablet Take 10 mg by mouth daily.            Review of Systems Review of Systems  Constitutional: Negative for fever, appetite change,and unexpected weight change.pos for fatigue  Eyes: Negative for pain and visual disturbance.  Respiratory: Negative for cough and shortness of breath.  pos for pain on deep inspiration Cardiovascular: Negative for  palpitations    Gastrointestinal: pos for nausea, diarrhea and constipation.  Also epigastric pain, neg for blood in stool or dark stool Genitourinary: Negative for  urgency and frequency.  Skin: Negative for pallor or rash   Neurological: Negative for weakness, light-headedness, numbness and headaches.  Hematological: Negative for adenopathy. Does not bruise/bleed easily.  Psychiatric/Behavioral: Negative for dysphoric mood. The patient is not nervous/anxious.          Objective:   Physical Exam  Constitutional: She appears well-developed and well-nourished. No distress.  HENT:  Head: Normocephalic and atraumatic.  Mouth/Throat: Oropharynx is clear and moist. No oropharyngeal exudate.  Eyes: Conjunctivae and EOM are normal. Pupils are equal, round, and reactive to light. No scleral icterus.  Neck: Normal range of motion. Neck supple. No JVD present. Carotid bruit is not present. No thyromegaly present.  Cardiovascular: Normal rate, regular rhythm and normal heart sounds.   Pulmonary/Chest: Breath sounds normal. No respiratory distress. She has no wheezes. She has no rales. She exhibits tenderness.       Diffusely distant bs   Anterior L rib tenderness without skin change or crepitus  Abdominal: She exhibits no shifting dullness, no pulsatile liver, no fluid wave, no abdominal bruit, no ascites and no mass. There is no hepatosplenomegaly. There is tenderness in the epigastric area. There is no rigidity, no rebound, no guarding and no CVA tenderness.  Musculoskeletal: She exhibits no edema.  Lymphadenopathy:    She has no cervical adenopathy.  Neurological: She is alert. She has normal reflexes.  Skin: Skin is warm and dry. No rash noted. No erythema. No pallor.  Psychiatric: She has a normal mood and affect.          Assessment & Plan:

## 2011-08-24 NOTE — Patient Instructions (Addendum)
Labs today for abdominal pain  Avoid greasy foods and spicy foods  If labs are ok we will set you up with GI appt  Chest and rib xrays today- will update you tomorrow If worse pain / fever or other symptoms let me know

## 2011-08-24 NOTE — Assessment & Plan Note (Signed)
Worse lately with bloating and diarrhea Hx of h pylori gastritis and tx in past as well as IBS Lab today Will likely ref to GI

## 2011-08-25 LAB — HEPATIC FUNCTION PANEL
ALT: 17 U/L (ref 0–35)
AST: 25 U/L (ref 0–37)
Albumin: 4.4 g/dL (ref 3.5–5.2)
Alkaline Phosphatase: 77 U/L (ref 39–117)
Bilirubin, Direct: 0.1 mg/dL (ref 0.0–0.3)
Total Bilirubin: 0.4 mg/dL (ref 0.3–1.2)
Total Protein: 8 g/dL (ref 6.0–8.3)

## 2011-08-25 LAB — CBC WITH DIFFERENTIAL/PLATELET
Basophils Absolute: 0.1 10*3/uL (ref 0.0–0.1)
Basophils Relative: 0.9 % (ref 0.0–3.0)
Eosinophils Absolute: 0.2 10*3/uL (ref 0.0–0.7)
Eosinophils Relative: 1.9 % (ref 0.0–5.0)
HCT: 46.6 % — ABNORMAL HIGH (ref 36.0–46.0)
Hemoglobin: 15.5 g/dL — ABNORMAL HIGH (ref 12.0–15.0)
Lymphocytes Relative: 27.7 % (ref 12.0–46.0)
Lymphs Abs: 2.7 10*3/uL (ref 0.7–4.0)
MCHC: 33.2 g/dL (ref 30.0–36.0)
MCV: 98.1 fl (ref 78.0–100.0)
Monocytes Absolute: 0.3 10*3/uL (ref 0.1–1.0)
Monocytes Relative: 2.6 % — ABNORMAL LOW (ref 3.0–12.0)
Neutro Abs: 6.6 10*3/uL (ref 1.4–7.7)
Neutrophils Relative %: 66.9 % (ref 43.0–77.0)
Platelets: 153 10*3/uL (ref 150.0–400.0)
RBC: 4.75 Mil/uL (ref 3.87–5.11)
RDW: 13.4 % (ref 11.5–14.6)
WBC: 9.8 10*3/uL (ref 4.5–10.5)

## 2011-08-25 LAB — BASIC METABOLIC PANEL
BUN: 9 mg/dL (ref 6–23)
CO2: 26 mEq/L (ref 19–32)
Calcium: 9.6 mg/dL (ref 8.4–10.5)
Chloride: 105 mEq/L (ref 96–112)
Creatinine, Ser: 1.1 mg/dL (ref 0.4–1.2)
GFR: 53.96 mL/min — ABNORMAL LOW (ref >60.00)
Glucose, Bld: 79 mg/dL (ref 70–99)
Potassium: 4.5 mEq/L (ref 3.5–5.1)
Sodium: 140 mEq/L (ref 135–145)

## 2011-08-25 LAB — AMYLASE: Amylase: 87 U/L (ref 27–131)

## 2011-08-25 LAB — LIPASE: Lipase: 38 U/L (ref 11.0–59.0)

## 2011-08-26 ENCOUNTER — Telehealth: Payer: Self-pay | Admitting: Family Medicine

## 2011-08-26 DIAGNOSIS — R1013 Epigastric pain: Secondary | ICD-10-CM

## 2011-08-26 NOTE — Telephone Encounter (Signed)
LMOM with contact name & number to inform patient and explain referral process/SLS

## 2011-08-26 NOTE — Telephone Encounter (Signed)
Labs are all ok - so let pt know I am going ahead and referring her to GI for further eval  Will do referral now

## 2011-09-14 ENCOUNTER — Other Ambulatory Visit: Payer: Self-pay | Admitting: Family Medicine

## 2011-09-14 NOTE — Telephone Encounter (Signed)
Last OV was 03/15/11, ok to refill?

## 2011-09-14 NOTE — Telephone Encounter (Signed)
Will refill electronically  

## 2011-09-20 ENCOUNTER — Encounter: Payer: Self-pay | Admitting: Gastroenterology

## 2011-09-20 ENCOUNTER — Ambulatory Visit (INDEPENDENT_AMBULATORY_CARE_PROVIDER_SITE_OTHER): Payer: 59 | Admitting: Gastroenterology

## 2011-09-20 ENCOUNTER — Other Ambulatory Visit (INDEPENDENT_AMBULATORY_CARE_PROVIDER_SITE_OTHER): Payer: 59

## 2011-09-20 VITALS — BP 104/72 | HR 80 | Ht 62.0 in | Wt 187.4 lb

## 2011-09-20 DIAGNOSIS — Z86010 Personal history of colonic polyps: Secondary | ICD-10-CM

## 2011-09-20 DIAGNOSIS — R1011 Right upper quadrant pain: Secondary | ICD-10-CM

## 2011-09-20 DIAGNOSIS — R197 Diarrhea, unspecified: Secondary | ICD-10-CM

## 2011-09-20 LAB — TSH: TSH: 1.23 u[IU]/mL (ref 0.35–5.50)

## 2011-09-20 MED ORDER — GLYCOPYRROLATE 2 MG PO TABS: 2.0000 mg | ORAL_TABLET | Freq: Two times a day (BID) | ORAL | Status: DC

## 2011-09-20 MED ORDER — ALIGN 4 MG PO CAPS: 1.0000 | ORAL_CAPSULE | Freq: Every day | ORAL | Status: DC

## 2011-09-20 NOTE — Patient Instructions (Addendum)
You have been given a separate informational sheet regarding your tobacco use, the importance of quitting and local resources to help you quit.  Your physician has requested that you go to the basement for the following lab work before leaving today:TSH, Stool studies. Follow instructions on Hemoccult cards and mail them back to [place] when finished.  We have sent the following medications to your pharmacy for you to pick up at your convenience: Robinul forte. We have given you samples of Align. This puts good bacteria back into your colon. You should take 1 capsule by mouth once daily x 2 weeks. If this works well for you, it can be purchased over the counter. Take Gas-X four times a day as needed for gas and bloating.   cc: Roxy Manns, MD

## 2011-09-20 NOTE — Progress Notes (Signed)
History of Present Illness: This is a 56 year-old female with a history of irritable bowel syndrome who complains of frequent postprandial right upper quadrant pain and occasionally generalized abdominal pain associated with urgent, watery, non bloody diarrhea. She also has significant abdominal bloating. She underwent upper endoscopy and colonoscopy in June 2010 showing an adenomatous colon polyp GERD and erosive gastritis. Duodenal biopsies at the time of upper endoscopy were normal. Her reflux symptoms are well controlled on daily Prilosec. She was treated with glycopyrrolate 2010 but does not recall if it helped. She has noted a slight weight gain. She states she can only eat very bland foods. Denies constipation, change in stool caliber, melena, hematochezia, nausea, vomiting, dysphagia, reflux symptoms, chest pain.  Review of Systems: Pertinent positive and negative review of systems were noted in the above HPI section. All other review of systems were otherwise negative.  Current Medications, Allergies, Past Medical History, Past Surgical History, Family History and Social History were reviewed in [employer] record.  Physical Exam: General: Well developed , well nourished, no acute distress Head: Normocephalic and atraumatic Eyes:  sclerae anicteric, EOMI Ears: Normal auditory acuity Mouth: No deformity or lesions Neck: Supple, no masses or thyromegaly Lungs: coarse breath sounds bilaterally Heart: Regular rate and rhythm; no murmurs, rubs or bruits Abdomen: Soft, mild right upper quadrant tenderness without rebound or guarding and non distended. No masses, hepatosplenomegaly or hernias noted. Normal Bowel sounds Musculoskeletal: Symmetrical with no gross deformities  Skin: No lesions on visible extremities Pulses:  Normal pulses noted Extremities: No clubbing, cyanosis, edema or deformities noted Neurological: Alert oriented x 4, grossly nonfocal Cervical Nodes:   No significant cervical adenopathy Inguinal Nodes: No significant inguinal adenopathy Psychological:  Alert and cooperative. Flat affect  Assessment and Recommendations:  1. Postprandial abdominal pain and diarrhea. I suspect she has irritable bowel syndrome. Rule out infectious diarrhea and other causes. Send TSH, standard stool studies and stool Hemoccults. Begin glycopyrrolate 2 mg twice a day. If her symptoms do not abate consider colonoscopy.  2. Personal history of adenomatous colon polyps. Surveillance colonoscopy recommended June 2015.  3. GERD and erosive gastritis. Continue standard antireflux measures and Prilosec 40 mg daily.

## 2011-09-21 ENCOUNTER — Other Ambulatory Visit: Payer: 59

## 2011-09-21 DIAGNOSIS — R197 Diarrhea, unspecified: Secondary | ICD-10-CM

## 2011-09-21 DIAGNOSIS — R1011 Right upper quadrant pain: Secondary | ICD-10-CM

## 2011-09-21 LAB — HEMOCCULT SLIDES (X 3 CARDS)
Fecal Occult Blood: NEGATIVE
OCCULT 1: NEGATIVE
OCCULT 2: NEGATIVE
OCCULT 3: NEGATIVE
OCCULT 4: NEGATIVE
OCCULT 5: NEGATIVE

## 2011-09-22 LAB — FECAL FAT QUALITATIVE
Free Fatty Acids: NORMAL
NEUTRAL FAT: NORMAL

## 2011-09-22 LAB — FECAL LACTOFERRIN, QUANT: Lactoferrin: NEGATIVE

## 2011-09-23 ENCOUNTER — Other Ambulatory Visit: Payer: Self-pay

## 2011-09-23 LAB — CLOSTRIDIUM DIFFICILE BY PCR: Toxigenic C. Difficile by PCR: NOT DETECTED

## 2011-09-23 LAB — OVA AND PARASITE SCREEN: OP: NONE SEEN

## 2011-09-23 LAB — GIARDIA ANTIGEN: Giardia Screen (EIA): NEGATIVE

## 2011-09-23 MED ORDER — ALPRAZOLAM 0.5 MG PO TABS: 0.5000 mg | ORAL_TABLET | Freq: Two times a day (BID) | ORAL | Status: DC | PRN

## 2011-09-23 NOTE — Telephone Encounter (Signed)
Last filled 05/27/11. Last OV 08/24/11. Ok to refill?

## 2011-09-23 NOTE — Telephone Encounter (Signed)
Called in Rx as directed.  

## 2011-09-23 NOTE — Telephone Encounter (Signed)
Px written for call in   

## 2011-09-25 LAB — STOOL CULTURE

## 2011-09-30 ENCOUNTER — Emergency Department (HOSPITAL_COMMUNITY): Payer: Medicare Other

## 2011-09-30 ENCOUNTER — Other Ambulatory Visit: Payer: Self-pay

## 2011-09-30 ENCOUNTER — Emergency Department (HOSPITAL_COMMUNITY)
Admission: EM | Admit: 2011-09-30 | Discharge: 2011-09-30 | Disposition: A | Discharge status: Home / Self Care | Payer: Medicare Other | Attending: Emergency Medicine | Admitting: Emergency Medicine

## 2011-09-30 ENCOUNTER — Encounter (HOSPITAL_COMMUNITY): Payer: Self-pay | Admitting: *Deleted

## 2011-09-30 DIAGNOSIS — F17200 Nicotine dependence, unspecified, uncomplicated: Secondary | ICD-10-CM | POA: Insufficient documentation

## 2011-09-30 DIAGNOSIS — Z9089 Acquired absence of other organs: Secondary | ICD-10-CM | POA: Insufficient documentation

## 2011-09-30 DIAGNOSIS — Z79899 Other long term (current) drug therapy: Secondary | ICD-10-CM | POA: Insufficient documentation

## 2011-09-30 DIAGNOSIS — G35 Multiple sclerosis: Secondary | ICD-10-CM | POA: Insufficient documentation

## 2011-09-30 DIAGNOSIS — R1013 Epigastric pain: Secondary | ICD-10-CM

## 2011-09-30 DIAGNOSIS — F411 Generalized anxiety disorder: Secondary | ICD-10-CM | POA: Insufficient documentation

## 2011-09-30 DIAGNOSIS — E7800 Pure hypercholesterolemia, unspecified: Secondary | ICD-10-CM | POA: Insufficient documentation

## 2011-09-30 HISTORY — DX: Pure hypercholesterolemia, unspecified: E78.00

## 2011-09-30 LAB — CBC WITH DIFFERENTIAL/PLATELET
Basophils Absolute: 0.1 10*3/uL (ref 0.0–0.1)
Basophils Relative: 1 % (ref 0–1)
Eosinophils Absolute: 0.2 10*3/uL (ref 0.0–0.7)
Eosinophils Relative: 3 % (ref 0–5)
HCT: 40.2 % (ref 36.0–46.0)
Hemoglobin: 14 g/dL (ref 12.0–15.0)
Lymphocytes Relative: 42 % (ref 12–46)
Lymphs Abs: 3 10*3/uL (ref 0.7–4.0)
MCH: 33 pg (ref 26.0–34.0)
MCHC: 34.8 g/dL (ref 30.0–36.0)
MCV: 94.8 fL (ref 78.0–100.0)
Monocytes Absolute: 0.4 10*3/uL (ref 0.1–1.0)
Monocytes Relative: 6 % (ref 3–12)
Neutro Abs: 3.4 10*3/uL (ref 1.7–7.7)
Neutrophils Relative %: 48 % (ref 43–77)
Platelets: 166 10*3/uL (ref 150–400)
RBC: 4.24 MIL/uL (ref 3.87–5.11)
RDW: 13.4 % (ref 11.5–15.5)
WBC: 7.1 10*3/uL (ref 4.0–10.5)

## 2011-09-30 LAB — BASIC METABOLIC PANEL
BUN: 10 mg/dL (ref 6–23)
CO2: 24 mEq/L (ref 19–32)
Calcium: 9.3 mg/dL (ref 8.4–10.5)
Chloride: 103 mEq/L (ref 96–112)
Creatinine, Ser: 0.89 mg/dL (ref 0.50–1.10)
GFR calc Af Amer: 84 mL/min — ABNORMAL LOW (ref >90)
GFR calc non Af Amer: 73 mL/min — ABNORMAL LOW (ref >90)
Glucose, Bld: 90 mg/dL (ref 70–99)
Potassium: 3.8 mEq/L (ref 3.5–5.1)
Sodium: 138 mEq/L (ref 135–145)

## 2011-09-30 LAB — HEPATIC FUNCTION PANEL
ALT: 11 U/L (ref 0–35)
AST: 19 U/L (ref 0–37)
Albumin: 3.8 g/dL (ref 3.5–5.2)
Alkaline Phosphatase: 76 U/L (ref 39–117)
Bilirubin, Direct: <0.1 mg/dL (ref 0.0–0.3)
Total Bilirubin: 0.2 mg/dL — ABNORMAL LOW (ref 0.3–1.2)
Total Protein: 7.1 g/dL (ref 6.0–8.3)

## 2011-09-30 LAB — POCT I-STAT TROPONIN I: Troponin i, poc: 0 ng/mL (ref 0.00–0.08)

## 2011-09-30 LAB — LIPASE, BLOOD: Lipase: 51 U/L (ref 11–59)

## 2011-09-30 MED ORDER — GI COCKTAIL ~~LOC~~
30.0000 mL | Freq: Once | ORAL | Status: AC
  Administered 2011-09-30: 30 mL via ORAL
  Filled 2011-09-30: qty 30

## 2011-09-30 NOTE — ED Provider Notes (Signed)
Medical screening examination/treatment/procedure(s) were performed by non-physician practitioner and as supervising physician I was immediately available for consultation/collaboration.   Celene Kras, MD 09/30/11 2159

## 2011-09-30 NOTE — ED Provider Notes (Signed)
History     CSN: [id number]  Arrival date & time 09/30/11  1754   First MD Initiated Contact with Patient 09/30/11 2001      8:20 PM HPI Patient reports this evening having severe epigastric pain and bloating. States pain was more severe than her typical GERD. Reports pain radiates to left shoulder. Denies nausea, vomiting, chest pain, shortness of breath, fever, diarrhea, back pain. Reports recent history of upper respiratory tract infection and chest congestion for the last 2 days.  Patient is a 56 y.o. female presenting with abdominal pain. The history is provided by the patient.  Abdominal Pain The primary symptoms of the illness include abdominal pain. The primary symptoms of the illness do not include fever, shortness of breath, nausea, vomiting, diarrhea, hematemesis, hematochezia, dysuria, vaginal discharge or vaginal bleeding. The onset of the illness was sudden. The problem has been gradually improving.  The patient states that she believes she is currently not pregnant. Additional symptoms associated with the illness include heartburn. Symptoms associated with the illness do not include chills, anorexia, diaphoresis, constipation, urgency, hematuria, frequency or back pain. Significant associated medical issues include GERD.    Past Medical History  Diagnosis Date  . Vertigo   . GERD (gastroesophageal reflux disease)   . Seizure disorder   . Urinary incontinence   . Plantar fasciitis   . Colon polyps 08/2008    Adenomatous  . Gastritis 08/2008    H. pylori  . Lung nodule   . Menopausal disorder   . Anxiety     Councelor- Evalina Field  . Multiple sclerosis   . Allergic rhinitis   . Hypercholesteremia     Past Surgical History  Procedure Date  . Cervical discectomy 2004    x 2, Anterior; Fusion C4-5, C5-6, C6-7, Dr. Donalee Citrin  . Tubal ligation   . Dilation and curettage of uterus   . Tonsillectomy   . Foot surgery     left  . Cholecystectomy   . Chest ct  11/2008    With small 4 mm nodule L lung base (rec re check in 1 year)  . Chest ct 07/2009    Re check chest CT - lung nodule stable  . Ct sinus ltd w/o cm 02/2009    negative  . Colonoscopy 08/2008    Polyps/ re check 5 yrs  . Esophagogastroduodenoscopy 08/2008    Erosive gastritis, h pylori (treated)  . Rotator cuff repair     right    Family History  Problem Relation Age of Onset  . Migraines Mother   . Heart attack Mother   . Coronary artery disease Mother   . Coronary artery disease Father     6 bypasses   . Colon cancer Maternal Grandfather 80  . Coronary artery disease Cousin   . Coronary artery disease Paternal Grandmother   . Colon cancer Paternal Aunt 70  . Lung cancer Paternal Aunt     History  Substance Use Topics  . Smoking status: Current Everyday Smoker -- 0.8 packs/day    Types: Cigarettes    Last Attempt to Quit: 11/07/2010  . Smokeless tobacco: Never Used   Comment: 1/2 to 1 pack per day   . Alcohol Use: No    OB History    Grav Para Term Preterm Abortions TAB SAB Ect Mult Living                  Review of Systems  Constitutional: Negative for fever, chills  and diaphoresis.  Respiratory: Negative for shortness of breath.   Cardiovascular: Negative for chest pain.  Gastrointestinal: Positive for heartburn, abdominal pain and abdominal distention. Negative for nausea, vomiting, diarrhea, constipation, blood in stool, hematochezia, anorexia and hematemesis.       Belching  Genitourinary: Negative for dysuria, urgency, frequency, hematuria, vaginal bleeding and vaginal discharge.  Musculoskeletal: Negative for back pain.  All other systems reviewed and are negative.    Allergies  Percocet; Amitriptyline hcl; Atorvastatin; Clarithromycin; Duloxetine; Levofloxacin; Pregabalin; Pseudoephedrine; and Zoloft  Home Medications   Current Outpatient Rx  Name Route Sig Dispense Refill  . ALPRAZOLAM 0.5 MG PO TABS Oral Take 0.5 mg by mouth 2 (two)  times daily as needed. For anxiety.    Marland Kitchen FLUTICASONE PROPIONATE 50 MCG/ACT NA SUSP Nasal Place 2 sprays into the nose daily as needed. For nasal irritation    . GABAPENTIN 300 MG PO CAPS Oral Take 600 mg by mouth at bedtime.     Marland Kitchen GLYCOPYRROLATE 2 MG PO TABS Oral Take 1 tablet (2 mg total) by mouth 2 (two) times daily. 180 tablet 3  . OMEPRAZOLE 40 MG PO CPDR Oral Take 40 mg by mouth daily.      Marland Kitchen ALIGN 4 MG PO CAPS Oral Take 1 capsule by mouth daily. 14 capsule 0  . ROSUVASTATIN CALCIUM 20 MG PO TABS Oral Take 10 mg by mouth daily.    . VENTOLIN HFA 108 (90 BASE) MCG/ACT IN AERS  INHALE 2 PUFFS UP TO EVERY 4 HOURS AS NEEDED FOR WHEEZING 1 Inhaler 11    BP 123/69  Pulse 66  Temp 98.2 F (36.8 C) (Oral)  Resp 20  SpO2 94%  Physical Exam  Vitals reviewed. Constitutional: She is oriented to person, place, and time. Vital signs are normal. She appears well-developed and well-nourished.  HENT:  Head: Normocephalic and atraumatic.  Eyes: Conjunctivae are normal. Pupils are equal, round, and reactive to light.  Neck: Normal range of motion. Neck supple.  Cardiovascular: Normal rate, regular rhythm and normal heart sounds.  Exam reveals no friction rub.   No murmur heard. Pulmonary/Chest: Effort normal. She has no wheezes. She has rhonchi (bilateral). She has no rales. She exhibits no tenderness.  Abdominal: Soft. She exhibits no distension and no mass. There is no tenderness. There is no rebound and no guarding.  Musculoskeletal: Normal range of motion.  Neurological: She is alert and oriented to person, place, and time. Coordination normal.  Skin: Skin is warm and dry. No rash noted. No erythema. No pallor.    ED Course  Procedures  Results for orders placed during the hospital encounter of 09/30/11  CBC WITH DIFFERENTIAL      Component Value Range   WBC 7.1  4.0 - 10.5 K/uL   RBC 4.24  3.87 - 5.11 MIL/uL   Hemoglobin 14.0  12.0 - 15.0 g/dL   HCT 82.9  56.2 - 13.0 %   MCV 94.8   78.0 - 100.0 fL   MCH 33.0  26.0 - 34.0 pg   MCHC 34.8  30.0 - 36.0 g/dL   RDW 86.5  78.4 - 69.6 %   Platelets 166  150 - 400 K/uL   Neutrophils Relative 48  43 - 77 %   Neutro Abs 3.4  1.7 - 7.7 K/uL   Lymphocytes Relative 42  12 - 46 %   Lymphs Abs 3.0  0.7 - 4.0 K/uL   Monocytes Relative 6  3 - 12 %  Monocytes Absolute 0.4  0.1 - 1.0 K/uL   Eosinophils Relative 3  0 - 5 %   Eosinophils Absolute 0.2  0.0 - 0.7 K/uL   Basophils Relative 1  0 - 1 %   Basophils Absolute 0.1  0.0 - 0.1 K/uL  BASIC METABOLIC PANEL      Component Value Range   Sodium 138  135 - 145 mEq/L   Potassium 3.8  3.5 - 5.1 mEq/L   Chloride 103  96 - 112 mEq/L   CO2 24  19 - 32 mEq/L   Glucose, Bld 90  70 - 99 mg/dL   BUN 10  6 - 23 mg/dL   Creatinine, Ser 0.86  0.50 - 1.10 mg/dL   Calcium 9.3  8.4 - 57.8 mg/dL   GFR calc non Af Amer 73 (*) >90 mL/min   GFR calc Af Amer 84 (*) >90 mL/min  POCT I-STAT TROPONIN I      Component Value Range   Troponin i, poc 0.00  0.00 - 0.08 ng/mL   Comment 3           LIPASE, BLOOD      Component Value Range   Lipase 51  11 - 59 U/L  HEPATIC FUNCTION PANEL      Component Value Range   Total Protein 7.1  6.0 - 8.3 g/dL   Albumin 3.8  3.5 - 5.2 g/dL   AST 19  0 - 37 U/L   ALT 11  0 - 35 U/L   Alkaline Phosphatase 76  39 - 117 U/L   Total Bilirubin 0.2 (*) 0.3 - 1.2 mg/dL   Bilirubin, Direct <4.6  0.0 - 0.3 mg/dL   Indirect Bilirubin NOT CALCULATED  0.3 - 0.9 mg/dL   Dg Chest 2 View  9/62/9528  *RADIOLOGY REPORT*  Clinical Data: Cough.  Epigastric pain and shortness of breath.  CHEST - 2 VIEW  Comparison: Chest x-ray 08/24/2011.  Findings: The heart size is normal.  The lungs are clear. Postsurgical changes are again noted in the cervical spine.  The patient is status post cholecystectomy.  IMPRESSION: Negative chest.  Original Report Authenticated By: Jamesetta Orleans. MATTERN, M.D.     MDM   Patient reports improvement after having GI cocktail. Speaking with  patient, patient reports this is the same pain she's had for several months and being evaluated by Gastrology. Reports this evening heart worsened. Suspect pain is likely gastritis vs ulcer disease. Recommended continued use of medication that was prescribed by Dr. Russella Dar. Also recommended potentially using Maalox over-the-counter for additional pain relief if needed. Patient voices understanding and is ready for discharge. Patient's pain did not sound typical for her coronary artery disease however patient had i-STAT troponin ordered in triage which was negative EKG indicated normal sinus rhythm and a rate of 66.     Thomasene Lot, PA-C 09/30/11 2141

## 2011-09-30 NOTE — ED Notes (Signed)
Pt in by ems. Reports epigastric pain, burping and lying down helps relieve pain. Hx of same pain, GERD, saw last week at PCP. Pain radiates to L shoulder.

## 2011-10-14 ENCOUNTER — Other Ambulatory Visit: Payer: Self-pay

## 2011-10-14 ENCOUNTER — Other Ambulatory Visit: Payer: Self-pay | Admitting: Neurology

## 2011-10-14 ENCOUNTER — Ambulatory Visit
Admission: RE | Admit: 2011-10-14 | Discharge: 2011-10-14 | Disposition: A | Discharge status: Home / Self Care | Payer: 59 | Source: Ambulatory Visit | Attending: Neurology | Admitting: Neurology

## 2011-10-14 DIAGNOSIS — F17210 Nicotine dependence, cigarettes, uncomplicated: Secondary | ICD-10-CM

## 2011-10-14 NOTE — Telephone Encounter (Signed)
Looks like this was just refilled 2 wk ago- please verify with pt that she needs it and ask how many/ how often she is taking it, thanks

## 2011-10-14 NOTE — Telephone Encounter (Signed)
Request for Alprazolam 0.5 mg, last OV was 08/26/11  Last  ordered 09/30/11. Ok to refill?

## 2011-10-15 ENCOUNTER — Other Ambulatory Visit: Payer: Self-pay

## 2011-10-15 NOTE — Telephone Encounter (Signed)
That refil is too early

## 2011-10-15 NOTE — Telephone Encounter (Signed)
Request for Xanax 0.5 mg. Last OV was 08/24/11 for abdominal pain. Last filled 09/30/11. Ok to refill?

## 2011-10-22 ENCOUNTER — Ambulatory Visit (INDEPENDENT_AMBULATORY_CARE_PROVIDER_SITE_OTHER): Payer: 59 | Admitting: Family Medicine

## 2011-10-22 ENCOUNTER — Encounter: Payer: Self-pay | Admitting: Family Medicine

## 2011-10-22 VITALS — BP 117/85 | HR 85 | Temp 97.9°F | Wt 185.8 lb

## 2011-10-22 DIAGNOSIS — F17200 Nicotine dependence, unspecified, uncomplicated: Secondary | ICD-10-CM

## 2011-10-22 DIAGNOSIS — J209 Acute bronchitis, unspecified: Secondary | ICD-10-CM

## 2011-10-22 MED ORDER — DOXYCYCLINE HYCLATE 100 MG PO TABS: 100.0000 mg | ORAL_TABLET | Freq: Two times a day (BID) | ORAL | Status: DC

## 2011-10-22 MED ORDER — BENZONATATE 100 MG PO CAPS: 100.0000 mg | ORAL_CAPSULE | Freq: Three times a day (TID) | ORAL | Status: AC | PRN

## 2011-10-22 NOTE — Assessment & Plan Note (Signed)
This began with a reaction to a new MS medication - that caused mouth ulcers/ wheezing While those symptoms are much improved- still suffering from prod cough with malaise and mild wheeze Will cover with doxycycline and given tessalon for cough Disc symptomatic care - see instructions on AVS  By today's exam - does not need prednisone- but if wheezing increases she will call  Update if not starting to improve in a week or if worsening

## 2011-10-22 NOTE — Patient Instructions (Addendum)
Drink lots of fluids Get mucinex DM and take as directed for cough and congestion Also try the tessalon for congestion Take doxycycline - for bronchitis Work hard on quitting smoking  Update if not starting to improve in a week or if worsening

## 2011-10-22 NOTE — Assessment & Plan Note (Signed)
Disc in detail risks of smoking and possible outcomes including copd, vascular/ heart disease, cancer , respiratory and sinus infections  Pt voices understanding  Pt trying to quit-down to 1/2 ppd  Wants to try E cig-disc options

## 2011-10-22 NOTE — Progress Notes (Signed)
Subjective:    Patient ID: Karina [name], female    DOB: [birth year], 56 y.o.   MRN: [id number]  HPI  Here for cong and cough Getting over a rxn to  MS med Broke out in her mouth and throat - cannot even wear her teeth  Has not been on abx Taking her husb cough syrup  Is coughing and congested Yellow/ white stuff  Also uri symtoms - nasal congestion and st  Some sinus pain   Cannot sleep at night due to cough  Felt feverish at times  Some wheeze - using ventolin inhaler Cut down her smoking since this happened    Since last visit was on a new MS med from Dr Sandria Manly -- tecfidera  She got pneumonia from it  He got very concerned - bad reaction , also inc bp and pulse   Patient Active Problem List  Diagnosis  . HYPOGLYCEMIA  . UNSPECIFIED VITAMIN D DEFICIENCY  . HYPERCHOLESTEROLEMIA  . Smoker  . ANXIETY, SITUATIONAL  . MULTIPLE SCLEROSIS  . NEUROPATHY-PERIPHERAL  . HEMORRHOIDS-EXTERNAL  . ALLERGIC RHINITIS  . LUNG NODULE  . GERD  . IRRITABLE BOWEL SYNDROME  . FIBROCYSTIC BREAST DISEASE  . OSTEOARTHRITIS, SHOULDER, RIGHT  . ARTHRALGIA  . BACK PAIN, LUMBAR, WITH RADICULOPATHY  . ROTATOR CUFF SYNDROME, RIGHT  . FOOT PAIN, BILATERAL  . SEIZURE DISORDER  . DIZZINESS OR VERTIGO  . SYMPTOM, EDEMA  . URINARY INCONTINENCE  . HYPERGLYCEMIA, BORDERLINE  . SYNCOPE, HX OF  . PERSONAL HX COLONIC POLYPS  . FOOT SURGERY, HX OF  . Other acquired absence of organ  . DILATION AND CURETTAGE, HX OF  . OTHER DISORDERS OF EYELID  . ABDOMINAL PAIN-MULTIPLE SITES  . Neck pain  . Abdominal pain, acute, epigastric  . Left-sided chest wall pain  . Bronchitis, acute, with bronchospasm   Past Medical History  Diagnosis Date  . Vertigo   . GERD (gastroesophageal reflux disease)   . Seizure disorder   . Urinary incontinence   . Plantar fasciitis   . Colon polyps 08/2008    Adenomatous  . Gastritis 08/2008    H. pylori  . Lung nodule   . Menopausal disorder   . Anxiety    Councelor- Evalina Field  . Multiple sclerosis   . Allergic rhinitis   . Hypercholesteremia    Past Surgical History  Procedure Date  . Cervical discectomy 2004    x 2, Anterior; Fusion C4-5, C5-6, C6-7, Dr. Donalee Citrin  . Tubal ligation   . Dilation and curettage of uterus   . Tonsillectomy   . Foot surgery     left  . Cholecystectomy   . Chest ct 11/2008    With small 4 mm nodule L lung base (rec re check in 1 year)  . Chest ct 07/2009    Re check chest CT - lung nodule stable  . Ct sinus ltd w/o cm 02/2009    negative  . Colonoscopy 08/2008    Polyps/ re check 5 yrs  . Esophagogastroduodenoscopy 08/2008    Erosive gastritis, h pylori (treated)  . Rotator cuff repair     right   History  Substance Use Topics  . Smoking status: Current Everyday Smoker -- 0.8 packs/day    Types: Cigarettes    Last Attempt to Quit: 11/07/2010  . Smokeless tobacco: Never Used   Comment: 1/2 to 1 pack per day   . Alcohol Use: No   Family History  Problem Relation Age  of Onset  . Migraines Mother   . Heart attack Mother   . Coronary artery disease Mother   . Coronary artery disease Father     6 bypasses   . Colon cancer Maternal Grandfather 80  . Coronary artery disease Cousin   . Coronary artery disease Paternal Grandmother   . Colon cancer Paternal Aunt 53  . Lung cancer Paternal Aunt    Allergies  Allergen Reactions  . Percocet (Oxycodone-Acetaminophen) Shortness Of Breath    Felt like going to pass out and sweating  . Tecfidera (Dimethyl Fumarate) Shortness Of Breath, Palpitations, Rash and Cough  . Amitriptyline Hcl     REACTION: swelling and itching  . Atorvastatin     REACTION: elevated LFT's  . Clarithromycin     REACTION: reaction not known  . Duloxetine     REACTION: pain  . Levofloxacin     REACTION: Rash  . Pregabalin     REACTION: LE swelling  . Pseudoephedrine     REACTION: legs hurt  . Zoloft (Sertraline Hcl) Other (See Comments)    Headaches    Current  Outpatient Prescriptions on File Prior to Visit  Medication Sig Dispense Refill  . ALPRAZolam (XANAX) 0.5 MG tablet Take 0.5 mg by mouth 2 (two) times daily as needed. For anxiety.      . fluticasone (FLONASE) 50 MCG/ACT nasal spray Place 2 sprays into the nose daily as needed. For nasal irritation      . gabapentin (NEURONTIN) 300 MG capsule Take 600 mg by mouth at bedtime.       Marland Kitchen glycopyrrolate (ROBINUL) 2 MG tablet Take 1 tablet (2 mg total) by mouth 2 (two) times daily.  180 tablet  3  . omeprazole (PRILOSEC) 40 MG capsule Take 40 mg by mouth daily.        . Probiotic Product (ALIGN) 4 MG CAPS Take 1 capsule by mouth daily.  14 capsule  0  . rosuvastatin (CRESTOR) 20 MG tablet Take 10 mg by mouth daily.      . VENTOLIN HFA 108 (90 BASE) MCG/ACT inhaler INHALE 2 PUFFS UP TO EVERY 4 HOURS AS NEEDED FOR WHEEZING  1 Inhaler  11      Review of Systems Review of Systems  Constitutional: Negative for fever, appetite change, fatigue and unexpected weight change.  Eyes: Negative for pain and visual disturbance.  ENT pos for mild sore throat/ cong and post nasal drip Respiratory: Negative for sob, pos for mild wheeze and prod cough   Cardiovascular: Negative for cp or palpitations    Gastrointestinal: Negative for nausea, diarrhea and constipation.  Genitourinary: Negative for urgency and frequency.  Skin: Negative for pallor or rash   Neurological: Negative for weakness, light-headedness, numbness and headaches.  Hematological: Negative for adenopathy. Does not bruise/bleed easily.  Psychiatric/Behavioral: Negative for dysphoric mood. The patient is not nervous/anxious.         Objective:   Physical Exam  Constitutional: She appears well-developed and well-nourished. No distress.  HENT:  Head: Normocephalic and atraumatic.  Right Ear: External ear normal.  Left Ear: External ear normal.  Mouth/Throat: Oropharynx is clear and moist.       Nares are injected and congested  No sinus  tenderness Post nasal drip noted   Eyes: Conjunctivae and EOM are normal. Pupils are equal, round, and reactive to light. No scleral icterus.  Neck: Normal range of motion. Neck supple. No JVD present. Carotid bruit is not present. No thyromegaly present.  Cardiovascular: Normal rate and regular rhythm.   Pulmonary/Chest: Effort normal. No respiratory distress. She has wheezes. She has no rales. She exhibits no tenderness.       Very scant wheeze on forced exp only Diffusely distant bs  No rales or rhonchi bs are very harsh at the bases   Abdominal: Soft. Bowel sounds are normal. She exhibits no distension. There is no tenderness.  Lymphadenopathy:    She has no cervical adenopathy.  Neurological: She is alert.  Skin: Skin is warm and dry. No rash noted. No erythema. No pallor.       No clubbing of nails  Psychiatric: She has a normal mood and affect.          Assessment & Plan:

## 2011-10-27 ENCOUNTER — Telehealth: Payer: Self-pay

## 2011-10-27 NOTE — Telephone Encounter (Signed)
Pt no better, does not feel like med working, productive cough with white phlegm, cough causing heaving(stomach is sore from heaving.. No fever. Tessalon and Mucinex not helping; has one more dose of antibiotic. [facility].

## 2011-10-27 NOTE — Telephone Encounter (Signed)
Please schedule her for f/u- re check with first available

## 2011-10-28 NOTE — Telephone Encounter (Signed)
Patient scheduled for 10/29/11 at 10:15 for follow up

## 2011-10-29 ENCOUNTER — Ambulatory Visit (INDEPENDENT_AMBULATORY_CARE_PROVIDER_SITE_OTHER): Payer: 59 | Admitting: Family Medicine

## 2011-10-29 ENCOUNTER — Ambulatory Visit (INDEPENDENT_AMBULATORY_CARE_PROVIDER_SITE_OTHER)
Admission: RE | Admit: 2011-10-29 | Discharge: 2011-10-29 | Disposition: A | Discharge status: Home / Self Care | Payer: 59 | Source: Ambulatory Visit | Attending: Family Medicine | Admitting: Family Medicine

## 2011-10-29 ENCOUNTER — Encounter: Payer: Self-pay | Admitting: Family Medicine

## 2011-10-29 VITALS — BP 128/72 | HR 78 | Temp 98.1°F | Ht 62.0 in | Wt 185.8 lb

## 2011-10-29 DIAGNOSIS — J209 Acute bronchitis, unspecified: Secondary | ICD-10-CM

## 2011-10-29 DIAGNOSIS — F17200 Nicotine dependence, unspecified, uncomplicated: Secondary | ICD-10-CM

## 2011-10-29 NOTE — Progress Notes (Signed)
Subjective:    Patient ID: Karina [name], female    DOB: Feb 22, 1956, 56 y.o.   MRN: [id number]  HPI Here for f/u of bronchitis- in smoker Given doxycycline -- finished that   Still having symptoms  Productive cough - not slowed down at all - still white and clear , is deeper - makes her almost vomit No fever  Is wheezing some- not a lot  A lot of rattling in chest   Ears are plugged up in ams  Some nasal cong Throat ok  This has been going on almost a month now after stopping the tecfidera-- she thinks this was the cause She is also allergic to most abx  She is convinced that pcn meds like augmentin do not work for her   Still smoking  Knows she should quit but not motivated to do so  Has never been dx with copd  Patient Active Problem List  Diagnosis  . HYPOGLYCEMIA  . UNSPECIFIED VITAMIN D DEFICIENCY  . HYPERCHOLESTEROLEMIA  . Smoker  . ANXIETY, SITUATIONAL  . MULTIPLE SCLEROSIS  . NEUROPATHY-PERIPHERAL  . HEMORRHOIDS-EXTERNAL  . ALLERGIC RHINITIS  . LUNG NODULE  . GERD  . IRRITABLE BOWEL SYNDROME  . FIBROCYSTIC BREAST DISEASE  . OSTEOARTHRITIS, SHOULDER, RIGHT  . ARTHRALGIA  . BACK PAIN, LUMBAR, WITH RADICULOPATHY  . ROTATOR CUFF SYNDROME, RIGHT  . FOOT PAIN, BILATERAL  . SEIZURE DISORDER  . DIZZINESS OR VERTIGO  . SYMPTOM, EDEMA  . URINARY INCONTINENCE  . HYPERGLYCEMIA, BORDERLINE  . SYNCOPE, HX OF  . PERSONAL HX COLONIC POLYPS  . FOOT SURGERY, HX OF  . Other acquired absence of organ  . DILATION AND CURETTAGE, HX OF  . OTHER DISORDERS OF EYELID  . ABDOMINAL PAIN-MULTIPLE SITES  . Neck pain  . Abdominal pain, acute, epigastric  . Left-sided chest wall pain  . Bronchitis, acute, with bronchospasm   Past Medical History  Diagnosis Date  . Vertigo   . GERD (gastroesophageal reflux disease)   . Seizure disorder   . Urinary incontinence   . Plantar fasciitis   . Colon polyps 08/2008    Adenomatous  . Gastritis 08/2008    H. pylori  . Lung  nodule   . Menopausal disorder   . Anxiety     Councelor- Evalina Field  . Multiple sclerosis   . Allergic rhinitis   . Hypercholesteremia    Past Surgical History  Procedure Date  . Cervical discectomy 2004    x 2, Anterior; Fusion C4-5, C5-6, C6-7, Dr. Donalee Citrin  . Tubal ligation   . Dilation and curettage of uterus   . Tonsillectomy   . Foot surgery     left  . Cholecystectomy   . Chest ct 11/2008    With small 4 mm nodule L lung base (rec re check in 1 year)  . Chest ct 07/2009    Re check chest CT - lung nodule stable  . Ct sinus ltd w/o cm 02/2009    negative  . Colonoscopy 08/2008    Polyps/ re check 5 yrs  . Esophagogastroduodenoscopy 08/2008    Erosive gastritis, h pylori (treated)  . Rotator cuff repair     right   History  Substance Use Topics  . Smoking status: Current Everyday Smoker -- 0.8 packs/day    Types: Cigarettes    Last Attempt to Quit: 11/07/2010  . Smokeless tobacco: Never Used   Comment: 1/2 to 1 pack per day   . Alcohol Use:  No   Family History  Problem Relation Age of Onset  . Migraines Mother   . Heart attack Mother   . Coronary artery disease Mother   . Coronary artery disease Father     6 bypasses   . Colon cancer Maternal Grandfather 80  . Coronary artery disease Cousin   . Coronary artery disease Paternal Grandmother   . Colon cancer Paternal Aunt 71  . Lung cancer Paternal Aunt    Allergies  Allergen Reactions  . Percocet (Oxycodone-Acetaminophen) Shortness Of Breath    Felt like going to pass out and sweating  . Tecfidera (Dimethyl Fumarate) Shortness Of Breath, Palpitations, Rash and Cough  . Amitriptyline Hcl     REACTION: swelling and itching  . Atorvastatin     REACTION: elevated LFT's  . Clarithromycin     REACTION: reaction not known  . Duloxetine     REACTION: pain  . Levofloxacin     REACTION: Rash  . Pregabalin     REACTION: LE swelling  . Pseudoephedrine     REACTION: legs hurt  . Zoloft (Sertraline Hcl)  Other (See Comments)    Headaches    Current Outpatient Prescriptions on File Prior to Visit  Medication Sig Dispense Refill  . ALPRAZolam (XANAX) 0.5 MG tablet Take 0.5 mg by mouth 2 (two) times daily as needed. For anxiety.      Marland Kitchen doxycycline (VIBRA-TABS) 100 MG tablet Take 1 tablet (100 mg total) by mouth 2 (two) times daily.  14 tablet  0  . fluticasone (FLONASE) 50 MCG/ACT nasal spray Place 2 sprays into the nose daily as needed. For nasal irritation      . gabapentin (NEURONTIN) 300 MG capsule Take 600 mg by mouth at bedtime.       Marland Kitchen glycopyrrolate (ROBINUL) 2 MG tablet Take 1 tablet (2 mg total) by mouth 2 (two) times daily.  180 tablet  3  . omeprazole (PRILOSEC) 40 MG capsule Take 40 mg by mouth daily.        . Probiotic Product (ALIGN) 4 MG CAPS Take 1 capsule by mouth daily.  14 capsule  0  . rosuvastatin (CRESTOR) 20 MG tablet Take 10 mg by mouth daily.      . VENTOLIN HFA 108 (90 BASE) MCG/ACT inhaler INHALE 2 PUFFS UP TO EVERY 4 HOURS AS NEEDED FOR WHEEZING  1 Inhaler  11    Patient Active Problem List  Diagnosis  . HYPOGLYCEMIA  . UNSPECIFIED VITAMIN D DEFICIENCY  . HYPERCHOLESTEROLEMIA  . Smoker  . ANXIETY, SITUATIONAL  . MULTIPLE SCLEROSIS  . NEUROPATHY-PERIPHERAL  . HEMORRHOIDS-EXTERNAL  . ALLERGIC RHINITIS  . LUNG NODULE  . GERD  . IRRITABLE BOWEL SYNDROME  . FIBROCYSTIC BREAST DISEASE  . OSTEOARTHRITIS, SHOULDER, RIGHT  . ARTHRALGIA  . BACK PAIN, LUMBAR, WITH RADICULOPATHY  . ROTATOR CUFF SYNDROME, RIGHT  . FOOT PAIN, BILATERAL  . SEIZURE DISORDER  . DIZZINESS OR VERTIGO  . SYMPTOM, EDEMA  . URINARY INCONTINENCE  . HYPERGLYCEMIA, BORDERLINE  . SYNCOPE, HX OF  . PERSONAL HX COLONIC POLYPS  . FOOT SURGERY, HX OF  . Other acquired absence of organ  . DILATION AND CURETTAGE, HX OF  . OTHER DISORDERS OF EYELID  . ABDOMINAL PAIN-MULTIPLE SITES  . Neck pain  . Abdominal pain, acute, epigastric  . Left-sided chest wall pain  . Bronchitis, acute, with  bronchospasm   Past Medical History  Diagnosis Date  . Vertigo   . GERD (gastroesophageal reflux disease)   .  Seizure disorder   . Urinary incontinence   . Plantar fasciitis   . Colon polyps 08/2008    Adenomatous  . Gastritis 08/2008    H. pylori  . Lung nodule   . Menopausal disorder   . Anxiety     Councelor- Evalina Field  . Multiple sclerosis   . Allergic rhinitis   . Hypercholesteremia    Past Surgical History  Procedure Date  . Cervical discectomy 2004    x 2, Anterior; Fusion C4-5, C5-6, C6-7, Dr. Donalee Citrin  . Tubal ligation   . Dilation and curettage of uterus   . Tonsillectomy   . Foot surgery     left  . Cholecystectomy   . Chest ct 11/2008    With small 4 mm nodule L lung base (rec re check in 1 year)  . Chest ct 07/2009    Re check chest CT - lung nodule stable  . Ct sinus ltd w/o cm 02/2009    negative  . Colonoscopy 08/2008    Polyps/ re check 5 yrs  . Esophagogastroduodenoscopy 08/2008    Erosive gastritis, h pylori (treated)  . Rotator cuff repair     right   History  Substance Use Topics  . Smoking status: Current Everyday Smoker -- 0.8 packs/day    Types: Cigarettes    Last Attempt to Quit: 11/07/2010  . Smokeless tobacco: Never Used   Comment: 1/2 to 1 pack per day   . Alcohol Use: No   Family History  Problem Relation Age of Onset  . Migraines Mother   . Heart attack Mother   . Coronary artery disease Mother   . Coronary artery disease Father     6 bypasses   . Colon cancer Maternal Grandfather 80  . Coronary artery disease Cousin   . Coronary artery disease Paternal Grandmother   . Colon cancer Paternal Aunt 48  . Lung cancer Paternal Aunt    Allergies  Allergen Reactions  . Percocet (Oxycodone-Acetaminophen) Shortness Of Breath    Felt like going to pass out and sweating  . Tecfidera (Dimethyl Fumarate) Shortness Of Breath, Palpitations, Rash and Cough  . Amitriptyline Hcl     REACTION: swelling and itching  . Atorvastatin      REACTION: elevated LFT's  . Clarithromycin     REACTION: reaction not known  . Duloxetine     REACTION: pain  . Levofloxacin     REACTION: Rash  . Pregabalin     REACTION: LE swelling  . Pseudoephedrine     REACTION: legs hurt  . Zoloft (Sertraline Hcl) Other (See Comments)    Headaches    Current Outpatient Prescriptions on File Prior to Visit  Medication Sig Dispense Refill  . ALPRAZolam (XANAX) 0.5 MG tablet Take 0.5 mg by mouth 2 (two) times daily as needed. For anxiety.      . benzonatate (TESSALON) 100 MG capsule Take 1 capsule (100 mg total) by mouth 3 (three) times daily as needed for cough.  30 capsule  0  . doxycycline (VIBRA-TABS) 100 MG tablet Take 1 tablet (100 mg total) by mouth 2 (two) times daily.  14 tablet  0  . fluticasone (FLONASE) 50 MCG/ACT nasal spray Place 2 sprays into the nose daily as needed. For nasal irritation      . gabapentin (NEURONTIN) 300 MG capsule Take 600 mg by mouth at bedtime.       Marland Kitchen glycopyrrolate (ROBINUL) 2 MG tablet Take 1 tablet (2 mg  total) by mouth 2 (two) times daily.  180 tablet  3  . omeprazole (PRILOSEC) 40 MG capsule Take 40 mg by mouth daily.        . Probiotic Product (ALIGN) 4 MG CAPS Take 1 capsule by mouth daily.  14 capsule  0  . rosuvastatin (CRESTOR) 20 MG tablet Take 10 mg by mouth daily.      . VENTOLIN HFA 108 (90 BASE) MCG/ACT inhaler INHALE 2 PUFFS UP TO EVERY 4 HOURS AS NEEDED FOR WHEEZING  1 Inhaler  11       Review of Systems    Review of Systems  Constitutional: Negative for fever, appetite change, fatigue and unexpected weight change.  Eyes: Negative for pain and visual disturbance.  Respiratory: Negative for sob- pos for cough and wheeze --(wheeze is not severe)   Cardiovascular: Negative for cp or palpitations  Chest is sore from coughing however  Gastrointestinal: Negative for nausea, diarrhea and constipation.  Genitourinary: Negative for urgency and frequency.  Skin: Negative for pallor or rash     Neurological: Negative for weakness, light-headedness, numbness and headaches.  Hematological: Negative for adenopathy. Does not bruise/bleed easily.  Psychiatric/Behavioral: Negative for dysphoric mood. The patient is not nervous/anxious.      Objective:   Physical Exam  Constitutional: She appears well-developed and well-nourished. No distress.       obese and well appearing   HENT:  Head: Normocephalic and atraumatic.  Right Ear: External ear normal.  Left Ear: External ear normal.  Mouth/Throat: Oropharynx is clear and moist. No oropharyngeal exudate.       Nares are boggy No facial tenderness  Eyes: Conjunctivae and EOM are normal. Pupils are equal, round, and reactive to light. No scleral icterus.  Neck: Normal range of motion. Neck supple. No JVD present. Carotid bruit is not present. No thyromegaly present.  Cardiovascular: Normal rate, regular rhythm, normal heart sounds and intact distal pulses.  Exam reveals no gallop.   Pulmonary/Chest: Effort normal. No respiratory distress. She has no wheezes. She has no rales. She exhibits tenderness.       Diffusely distant bs  bs are harsh at bases Scant wheeze on forced exp only  Abdominal: Soft. Bowel sounds are normal. She exhibits no distension, no abdominal bruit and no mass. There is no tenderness.  Lymphadenopathy:    She has no cervical adenopathy.  Neurological: She is alert. No cranial nerve deficit.  Skin: Skin is warm and dry. No rash noted.  Psychiatric: She has a normal mood and affect.          Assessment & Plan:

## 2011-10-29 NOTE — Patient Instructions (Signed)
Chest xray today  We will call you with result later today and make a plan  We will refer you to pulmonary at check out  If you get worse or more short of breath please update me  Keep thinking about quitting smoking- I think that is very important

## 2011-10-29 NOTE — Assessment & Plan Note (Signed)
Ongoing since she had reaction to her last MS med - she is worried about pneumonia No imp with doxycycline Has many abx allergies  Also cannot take narcotics for cough - is on tessalon and mucinex Miserable- desires ref to pulm inst she does need to quit smoking as well

## 2011-10-29 NOTE — Assessment & Plan Note (Signed)
Disc in detail risks of smoking and possible outcomes including copd, vascular/ heart disease, cancer , respiratory and sinus infections  Pt voices understanding Now has ongoing bronchial symptoms cxr today and ref to pulm

## 2011-11-01 ENCOUNTER — Other Ambulatory Visit: Payer: 59

## 2011-11-01 ENCOUNTER — Encounter: Payer: Self-pay | Admitting: Gastroenterology

## 2011-11-01 ENCOUNTER — Ambulatory Visit (INDEPENDENT_AMBULATORY_CARE_PROVIDER_SITE_OTHER): Payer: 59 | Admitting: Gastroenterology

## 2011-11-01 VITALS — BP 124/72 | HR 68 | Ht 62.0 in | Wt 185.0 lb

## 2011-11-01 DIAGNOSIS — K219 Gastro-esophageal reflux disease without esophagitis: Secondary | ICD-10-CM

## 2011-11-01 DIAGNOSIS — K589 Irritable bowel syndrome without diarrhea: Secondary | ICD-10-CM

## 2011-11-01 MED ORDER — HYOSCYAMINE SULFATE 0.125 MG SL SUBL: SUBLINGUAL_TABLET | SUBLINGUAL | Status: DC

## 2011-11-01 MED ORDER — OMEPRAZOLE 40 MG PO CPDR: 40.0000 mg | DELAYED_RELEASE_CAPSULE | Freq: Two times a day (BID) | ORAL | Status: DC

## 2011-11-01 NOTE — Progress Notes (Signed)
History of Present Illness: This is a 56 year old female who relates persistent problems with upper abdominal pain following meals associated with bloating, cramping and urgent loose stools. Glycopyrrolate has not been helpful in controlling the symptoms. She was seen in the emergency department in late July for these complaints and her symptoms improved with the GI cocktail.  Current Medications, Allergies, Past Medical History, Past Surgical History, Family History and Social History were reviewed in [employer] record.  Physical Exam: General: Well developed , well nourished, no acute distress Head: Normocephalic and atraumatic Eyes:  sclerae anicteric, EOMI Ears: Normal auditory acuity Mouth: No deformity or lesions Lungs: Clear throughout to auscultation Heart: Regular rate and rhythm; no murmurs, rubs or bruits Abdomen: Soft, non tender and non distended. No masses, hepatosplenomegaly or hernias noted. Normal Bowel sounds Musculoskeletal: Symmetrical with no gross deformities  Extremities: No clubbing, cyanosis, edema or deformities noted Neurological: Alert oriented x 4, grossly nonfocal Psychological:  Alert and cooperative. Normal mood and affect  Assessment and Recommendations:  1. Persistent postprandial upper abdominal pain associated with bloating cramping and urgent stools. Obtain a celiac panel. I presume her symptoms are due to irritable bowel syndrome, GERD and/or gastritis. Continue all current GI medications. Begin hyoscyamine 1-2 before meals. Increase omeprazole 40 mg twice a day. She is advised to call if her symptoms do not come under good control.  2. Personal history of adenomatous colon polyps. Surveillance colonoscopy recommended in June 2015.

## 2011-11-01 NOTE — Patient Instructions (Addendum)
Your physician has requested that you go to the basement for the following lab work before leaving today: Celiac Panel.  We have sent the following medications to your pharmacy for you to pick up at your convenience: Omeprazole to twice daily dosing and Hyoscyamine.  Call back if your symptoms are not any better after taking these medications.  cc: Roxy Manns, MD

## 2011-11-02 LAB — CELIAC PANEL 10
Endomysial Screen: NEGATIVE
Gliadin IgA: 4.3 U/mL (ref <20)
Gliadin IgG: 3.8 U/mL (ref <20)
IgA: 176 mg/dL (ref 69–380)
Tissue Transglut Ab: 3.8 U/mL (ref <20)
Tissue Transglutaminase Ab, IgA: 5.4 U/mL (ref <20)

## 2011-11-19 ENCOUNTER — Observation Stay (HOSPITAL_COMMUNITY): Payer: Medicare Other

## 2011-11-19 ENCOUNTER — Emergency Department (HOSPITAL_COMMUNITY): Payer: Medicare Other

## 2011-11-19 ENCOUNTER — Encounter (HOSPITAL_COMMUNITY): Payer: Self-pay

## 2011-11-19 ENCOUNTER — Observation Stay (HOSPITAL_COMMUNITY)
Admission: EM | Admit: 2011-11-19 | Discharge: 2011-11-20 | Disposition: A | Discharge status: Home / Self Care | Payer: Medicare Other | Attending: Emergency Medicine | Admitting: Emergency Medicine

## 2011-11-19 DIAGNOSIS — E119 Type 2 diabetes mellitus without complications: Secondary | ICD-10-CM | POA: Insufficient documentation

## 2011-11-19 DIAGNOSIS — Z79899 Other long term (current) drug therapy: Secondary | ICD-10-CM | POA: Insufficient documentation

## 2011-11-19 DIAGNOSIS — R531 Weakness: Secondary | ICD-10-CM

## 2011-11-19 DIAGNOSIS — F17200 Nicotine dependence, unspecified, uncomplicated: Secondary | ICD-10-CM | POA: Insufficient documentation

## 2011-11-19 DIAGNOSIS — R5381 Other malaise: Secondary | ICD-10-CM | POA: Insufficient documentation

## 2011-11-19 DIAGNOSIS — F411 Generalized anxiety disorder: Secondary | ICD-10-CM | POA: Insufficient documentation

## 2011-11-19 DIAGNOSIS — G35 Multiple sclerosis: Secondary | ICD-10-CM | POA: Insufficient documentation

## 2011-11-19 DIAGNOSIS — K219 Gastro-esophageal reflux disease without esophagitis: Secondary | ICD-10-CM | POA: Insufficient documentation

## 2011-11-19 DIAGNOSIS — R059 Cough, unspecified: Secondary | ICD-10-CM | POA: Insufficient documentation

## 2011-11-19 DIAGNOSIS — E7800 Pure hypercholesterolemia, unspecified: Secondary | ICD-10-CM | POA: Insufficient documentation

## 2011-11-19 DIAGNOSIS — R51 Headache: Secondary | ICD-10-CM | POA: Insufficient documentation

## 2011-11-19 DIAGNOSIS — R05 Cough: Secondary | ICD-10-CM | POA: Insufficient documentation

## 2011-11-19 DIAGNOSIS — R4182 Altered mental status, unspecified: Principal | ICD-10-CM | POA: Insufficient documentation

## 2011-11-19 DIAGNOSIS — R5383 Other fatigue: Secondary | ICD-10-CM | POA: Insufficient documentation

## 2011-11-19 LAB — URINALYSIS, ROUTINE W REFLEX MICROSCOPIC
Bilirubin Urine: NEGATIVE
Glucose, UA: NEGATIVE mg/dL
Hgb urine dipstick: NEGATIVE
Ketones, ur: NEGATIVE mg/dL
Nitrite: POSITIVE — AB
Protein, ur: NEGATIVE mg/dL
Specific Gravity, Urine: 1.008 (ref 1.005–1.030)
Urobilinogen, UA: 0.2 mg/dL (ref 0.0–1.0)
pH: 5.5 (ref 5.0–8.0)

## 2011-11-19 LAB — URINE MICROSCOPIC-ADD ON

## 2011-11-19 LAB — LIPID PANEL
Cholesterol: 159 mg/dL (ref 0–200)
HDL: 67 mg/dL (ref >39)
LDL Cholesterol: 77 mg/dL (ref 0–99)
Total CHOL/HDL Ratio: 2.4 RATIO
Triglycerides: 76 mg/dL (ref <150)
VLDL: 15 mg/dL (ref 0–40)

## 2011-11-19 LAB — CBC WITH DIFFERENTIAL/PLATELET
Basophils Absolute: 0.1 10*3/uL (ref 0.0–0.1)
Basophils Relative: 1 % (ref 0–1)
Eosinophils Absolute: 0.1 10*3/uL (ref 0.0–0.7)
Eosinophils Relative: 2 % (ref 0–5)
HCT: 41 % (ref 36.0–46.0)
Hemoglobin: 14.1 g/dL (ref 12.0–15.0)
Lymphocytes Relative: 43 % (ref 12–46)
Lymphs Abs: 3.4 10*3/uL (ref 0.7–4.0)
MCH: 32.5 pg (ref 26.0–34.0)
MCHC: 34.4 g/dL (ref 30.0–36.0)
MCV: 94.5 fL (ref 78.0–100.0)
Monocytes Absolute: 0.4 10*3/uL (ref 0.1–1.0)
Monocytes Relative: 5 % (ref 3–12)
Neutro Abs: 3.9 10*3/uL (ref 1.7–7.7)
Neutrophils Relative %: 50 % (ref 43–77)
Platelets: 163 10*3/uL (ref 150–400)
RBC: 4.34 MIL/uL (ref 3.87–5.11)
RDW: 13.1 % (ref 11.5–15.5)
WBC: 7.9 10*3/uL (ref 4.0–10.5)

## 2011-11-19 LAB — PROTIME-INR
INR: 0.91 (ref 0.00–1.49)
Prothrombin Time: 12.5 seconds (ref 11.6–15.2)

## 2011-11-19 LAB — BASIC METABOLIC PANEL
BUN: 7 mg/dL (ref 6–23)
CO2: 27 mEq/L (ref 19–32)
Calcium: 9.7 mg/dL (ref 8.4–10.5)
Chloride: 102 mEq/L (ref 96–112)
Creatinine, Ser: 0.95 mg/dL (ref 0.50–1.10)
GFR calc Af Amer: 78 mL/min — ABNORMAL LOW (ref >90)
GFR calc non Af Amer: 67 mL/min — ABNORMAL LOW (ref >90)
Glucose, Bld: 100 mg/dL — ABNORMAL HIGH (ref 70–99)
Potassium: 3.6 mEq/L (ref 3.5–5.1)
Sodium: 137 mEq/L (ref 135–145)

## 2011-11-19 LAB — APTT: aPTT: 24 seconds (ref 24–37)

## 2011-11-19 MED ORDER — DEXTROSE 5 % IV SOLN
1.0000 g | Freq: Once | INTRAVENOUS | Status: AC
  Administered 2011-11-19: 1 g via INTRAVENOUS
  Filled 2011-11-19: qty 10

## 2011-11-19 MED ORDER — GABAPENTIN 300 MG PO CAPS
300.0000 mg | ORAL_CAPSULE | Freq: Every day | ORAL | Status: DC
  Filled 2011-11-19: qty 1

## 2011-11-19 MED ORDER — CEPHALEXIN 250 MG PO CAPS
500.0000 mg | ORAL_CAPSULE | Freq: Four times a day (QID) | ORAL | Status: DC
  Administered 2011-11-19: 500 mg via ORAL
  Filled 2011-11-19: qty 2

## 2011-11-19 MED ORDER — ALBUTEROL SULFATE HFA 108 (90 BASE) MCG/ACT IN AERS: 2.0000 | INHALATION_SPRAY | RESPIRATORY_TRACT | Status: DC | PRN

## 2011-11-19 MED ORDER — PANTOPRAZOLE SODIUM 40 MG PO TBEC: 40.0000 mg | DELAYED_RELEASE_TABLET | Freq: Every day | ORAL | Status: DC

## 2011-11-19 MED ORDER — SODIUM CHLORIDE 0.9 % IV BOLUS (SEPSIS)
500.0000 mL | Freq: Once | INTRAVENOUS | Status: AC
  Administered 2011-11-19: 500 mL via INTRAVENOUS

## 2011-11-19 MED ORDER — HYOSCYAMINE SULFATE 0.125 MG SL SUBL
0.1250 mg | SUBLINGUAL_TABLET | Freq: Once | SUBLINGUAL | Status: DC
  Filled 2011-11-19: qty 1

## 2011-11-19 MED ORDER — ROSUVASTATIN CALCIUM 10 MG PO TABS
10.0000 mg | ORAL_TABLET | Freq: Every day | ORAL | Status: DC
  Filled 2011-11-19: qty 1

## 2011-11-19 MED ORDER — ALPRAZOLAM 0.25 MG PO TABS
0.5000 mg | ORAL_TABLET | Freq: Once | ORAL | Status: AC
  Administered 2011-11-19: 0.5 mg via ORAL
  Filled 2011-11-19: qty 2

## 2011-11-19 NOTE — ED Provider Notes (Signed)
History     CSN: [id number]  Arrival date & time 11/19/11  1649   First MD Initiated Contact with Patient 11/19/11 1703      Chief Complaint  Patient presents with  . Altered Mental Status  . Weakness  . Anxiety    (Consider location/radiation/quality/duration/timing/severity/associated sxs/prior treatment) Patient is a 56 y.o. female presenting with altered mental status, weakness, and anxiety. The history is provided by the patient. No language interpreter was used.  Altered Mental Status This is a new problem. The current episode started today. The problem occurs rarely. The problem has been resolved. Associated symptoms include coughing, fatigue and weakness. Pertinent negatives include no abdominal pain, chest pain, fever, headaches, nausea, neck pain, numbness, visual change or vomiting. Nothing aggravates the symptoms. She has tried nothing for the symptoms.  Weakness The primary symptoms include altered mental status. Primary symptoms do not include headaches, dizziness, visual change, fever, nausea or vomiting.  Additional symptoms include weakness and anxiety.  Anxiety Associated symptoms include coughing, fatigue and weakness. Pertinent negatives include no abdominal pain, chest pain, fever, headaches, nausea, neck pain, numbness, visual change or vomiting.   56 year old female coming in today with episodes of right upper extremity weakness and slurred speech her husband. Husband states that he left her at 3 AM and that the last time she was seen normal. States that when he came on at 3 PM she was on the phone talking with someone and the person on the phone said she had slurred speech. The patient states that her right upper arm is weak since around 1 PM. Patient unable to lift arm but when asked to sit forward she moves the arm laterally with no problem area right lower extremity unremarkable. Patient stopped talking clear. States that she has been under a lot of stress lately.  States that her father was present nursing home yesterday. States that this morning they called her and he was kicking staff and causing a rectus and they kicked him out of the nursing home. States that she's been under months of stress trying to take her father and her family is not helping her. States that last month or on a new medication for her MS and they cause her to be sick. Also states that she has a headache. States she has a cough as well as is going to see Dr.Clance on Monday for the cough. She has had a recent round of doxycycline for her cough. Her PCP is Dr. Milinda Antis her neurologist is Dr. Sandria Manly. Multiple medical problems and multiple surgeries and procedures.  Past Medical History  Diagnosis Date  . Vertigo   . GERD (gastroesophageal reflux disease)   . Seizure disorder   . Urinary incontinence   . Plantar fasciitis   . Colon polyps 08/2008    Adenomatous  . Gastritis 08/2008    H. pylori  . Lung nodule   . Menopausal disorder   . Anxiety     Councelor- Evalina Field  . Multiple sclerosis   . Allergic rhinitis   . Hypercholesteremia   . Diabetes mellitus     Past Surgical History  Procedure Date  . Cervical discectomy 2004    x 2, Anterior; Fusion C4-5, C5-6, C6-7, Dr. Donalee Citrin  . Tubal ligation   . Dilation and curettage of uterus   . Tonsillectomy   . Foot surgery     left  . Cholecystectomy   . Chest ct 11/2008    With small 4 mm  nodule L lung base (rec re check in 1 year)  . Chest ct 07/2009    Re check chest CT - lung nodule stable  . Ct sinus ltd w/o cm 02/2009    negative  . Colonoscopy 08/2008    Polyps/ re check 5 yrs  . Esophagogastroduodenoscopy 08/2008    Erosive gastritis, h pylori (treated)  . Rotator cuff repair     right    Family History  Problem Relation Age of Onset  . Migraines Mother   . Heart attack Mother   . Coronary artery disease Mother   . Coronary artery disease Father     6 bypasses   . Colon cancer Maternal Grandfather 80    . Coronary artery disease Cousin   . Coronary artery disease Paternal Grandmother   . Colon cancer Paternal Aunt 24  . Lung cancer Paternal Aunt     History  Substance Use Topics  . Smoking status: Current Every Day Smoker -- 0.8 packs/day    Types: Cigarettes    Last Attempt to Quit: 11/07/2010  . Smokeless tobacco: Never Used   Comment: 1/2 to 1 pack per day   . Alcohol Use: No    OB History    Grav Para Term Preterm Abortions TAB SAB Ect Mult Living                  Review of Systems  Constitutional: Positive for fatigue. Negative for fever.  HENT: Negative.  Negative for neck pain.   Eyes: Negative.   Respiratory: Positive for cough. Negative for shortness of breath and wheezing.   Cardiovascular: Negative.  Negative for chest pain, palpitations and leg swelling.  Gastrointestinal: Negative.  Negative for nausea, vomiting and abdominal pain.  Musculoskeletal: Negative for back pain.  Neurological: Positive for weakness. Negative for dizziness, facial asymmetry, numbness and headaches.  Psychiatric/Behavioral: Positive for altered mental status.  All other systems reviewed and are negative.    Allergies  Percocet; Tecfidera; Amitriptyline hcl; Atorvastatin; Clarithromycin; Duloxetine; Levofloxacin; Pregabalin; Pseudoephedrine; and Zoloft  Home Medications   Current Outpatient Rx  Name Route Sig Dispense Refill  . ALPRAZOLAM 0.5 MG PO TABS Oral Take 0.5 mg by mouth 2 (two) times daily as needed. For anxiety.    Marland Kitchen GABAPENTIN 300 MG PO CAPS Oral Take 600 mg by mouth at bedtime.     Marland Kitchen HYOSCYAMINE SULFATE 0.125 MG SL SUBL  1-2 tablets by mouth before meals 60 tablet 11  . OMEPRAZOLE 40 MG PO CPDR Oral Take 1 capsule (40 mg total) by mouth 2 (two) times daily. 60 capsule 11  . ROSUVASTATIN CALCIUM 20 MG PO TABS Oral Take 10 mg by mouth daily.    . VENTOLIN HFA 108 (90 BASE) MCG/ACT IN AERS  INHALE 2 PUFFS UP TO EVERY 4 HOURS AS NEEDED FOR WHEEZING 1 Inhaler 11    BP  104/72  Pulse 81  Resp 21  SpO2 95%  Physical Exam  Nursing note and vitals reviewed. Constitutional: She is oriented to person, place, and time. She appears well-developed and well-nourished.  HENT:  Head: Normocephalic and atraumatic.  Eyes: Conjunctivae normal and EOM are normal. Pupils are equal, round, and reactive to light.  Neck: Normal range of motion. Neck supple.  Cardiovascular: Normal rate and regular rhythm.   Pulmonary/Chest: Effort normal.  Abdominal: Soft. Bowel sounds are normal. She exhibits no distension. There is no tenderness.  Musculoskeletal: Normal range of motion. She exhibits no edema and no tenderness.  Neurological: She is alert and oriented to person, place, and time. She has normal reflexes. No cranial nerve deficit or sensory deficit. Coordination and gait normal. GCS eye subscore is 4. GCS verbal subscore is 5. GCS motor subscore is 6.       PEARL   Skin: Skin is warm and dry.  Psychiatric: She has a normal mood and affect.    ED Course  Procedures (including critical care time)  Labs Reviewed  BASIC METABOLIC PANEL - Abnormal; Notable for the following:    Glucose, Bld 100 (*)     GFR calc non Af Amer 67 (*)     GFR calc Af Amer 78 (*)     All other components within normal limits  CBC WITH DIFFERENTIAL  URINALYSIS, ROUTINE W REFLEX MICROSCOPIC   Ct Head Wo Contrast  11/19/2011  *RADIOLOGY REPORT*  Clinical Data: 56 year old female with headache and altered mental status.  Arm weakness.  CT HEAD WITHOUT CONTRAST  Technique:  Contiguous axial images were obtained from the base of the skull through the vertex without contrast.  Comparison: 07/21/2011 MRI.  Findings: Mild chronic small vessel white matter ischemic changes are again noted.  No acute intracranial abnormalities are identified, including mass lesion or mass effect, hydrocephalus, extra-axial fluid collection, midline shift, hemorrhage, or acute infarction.  The visualized bony calvarium is  unremarkable.  IMPRESSION: No evidence of acute intracranial abnormality.  Mild chronic small vessel white matter ischemic changes.   Original Report Authenticated By: Rosendo Gros, M.D.      No diagnosis found.    MDM   Date: 11/19/2011  Rate: 84  Rhythm: normal sinus rhythm  QRS Axis: normal  Intervals: normal  ST/T Wave abnormalities: normal  Conduction Disutrbances:none  Narrative Interpretation:   Old EKG Reviewed: unchanged  2200 Report given to [facility] in CDU.  Patient will go to CDU for TIA protocol. Right upper extremity is stronger now patient is able to lift the arm but it is still weaker than the left upper extremity. CT of the head was negative for stroke. CBC and the metastases are unremarkable. Chest x-ray unremarkable. Patient is positive for UTI. Rocephin 1 g IV given in the ER and we will start Keflex. She will need to go home on a prescription for Keflex if she is not admitted. She can followup with Dr. love for MRI of the negative. She is an MS patient and goes to Dr. love already for ms.  0100 MRI shows no stroke.  Patient will be discharged home with neurology follow up.  Rx for keflex called in for + UTI.  Return for n/v or high fever.  RUE back to base line.  Speech normal.  Follow up with pcp for uti.         Remi Haggard, NP 11/20/11 1219  Remi Haggard, NP 11/20/11 1224

## 2011-11-19 NOTE — ED Notes (Signed)
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 Pt alert and oriented, states she feels tired and anxious, took half of a xanax this am. Pt with generalized weakness noted.

## 2011-11-19 NOTE — ED Notes (Signed)
Pt husband came home and found wife at 1530 with slurred speech and right sided deficits. Last said she was normal at 0300 this am. Pt able to ambulate and talk with EMS without problem

## 2011-11-19 NOTE — ED Notes (Signed)
Pt states that she woke up under a lot of stress this am. States she put her father in a nursing home yesterday and he tore the place up last night and he can't stay there. Pt states she got upset after. Pt reports that she took a half of a xanax to calm herself.

## 2011-11-19 NOTE — ED Notes (Signed)
Pt remains in MRI at this time  

## 2011-11-19 NOTE — ED Notes (Signed)
Pt to CT scan at this time, no distress noted.

## 2011-11-20 LAB — HEMOGLOBIN A1C
Hgb A1c MFr Bld: 5.5 % (ref <5.7)
Mean Plasma Glucose: 111 mg/dL (ref <117)

## 2011-11-21 NOTE — ED Provider Notes (Signed)
Medical screening examination/treatment/procedure(s) were conducted as a shared visit with non-physician practitioner(s) and myself.  I personally evaluated the patient during the encounter.  Derwood Kaplan, MD 11/21/11 0028

## 2011-11-22 ENCOUNTER — Other Ambulatory Visit: Payer: Self-pay | Admitting: Pulmonary Disease

## 2011-11-22 ENCOUNTER — Ambulatory Visit (INDEPENDENT_AMBULATORY_CARE_PROVIDER_SITE_OTHER): Payer: 59 | Admitting: Pulmonary Disease

## 2011-11-22 ENCOUNTER — Encounter: Payer: Self-pay | Admitting: Pulmonary Disease

## 2011-11-22 VITALS — BP 118/78 | HR 91 | Temp 98.5°F | Ht 62.0 in | Wt 186.0 lb

## 2011-11-22 DIAGNOSIS — Z23 Encounter for immunization: Secondary | ICD-10-CM

## 2011-11-22 DIAGNOSIS — R05 Cough: Secondary | ICD-10-CM

## 2011-11-22 DIAGNOSIS — R059 Cough, unspecified: Secondary | ICD-10-CM

## 2011-11-22 DIAGNOSIS — R053 Chronic cough: Secondary | ICD-10-CM | POA: Insufficient documentation

## 2011-11-22 NOTE — Assessment & Plan Note (Signed)
The patient has a chronic ongoing cough since the end of July, and it is unclear from her history how much this may be upper airway versus lower airway.  She clearly has a nasal voice with postnasal drip, and also has a history of reflux disease.  She also has ongoing smoking which can hear it patent the upper airway and prevent her cough from resolving.  Finally, it is unclear if she has chronic asthmatic bronchitis related to ongoing airway inflammation from her smoking.  At this point, I would like to treat her for postnasal drip, continue treatment for reflux disease, and have stressed the importance of total smoking cessation.  Will arrange for full pulmonary function studies to evaluate her air flow limitation.

## 2011-11-22 NOTE — Progress Notes (Signed)
  Subjective:    Patient ID: Karina [name], female    DOB: 06-18-1955, 56 y.o.   MRN: [id number]  HPI The patient is a 56 year old female who I have been asked to see for chronic cough.  She has a long history of tobacco abuse, but was in her usual state of health until the end of July when she started on some type of medication for multiple sclerosis.  She began to develop worsening shortness of breath as well as a cough with nonpurulent mucus.  She thought the symptoms came from her new medication.  She did not have any purulence or definite wheezing, and was therefore treated conservatively with Tessalon pearls.  She has continued to have more shortness of breath than usual with any activity, and also describes chest rattling.  Her cough will produce large amounts of mucus at times, and continues to be nonpurulent.  She also has had a nasal voice with chronic nasal congestion and postnasal drip.  She has not had any purulent discharge from her nose.  The patient also has a long history of acid reflux disease, for which she is on b.i.d. Proton pump inhibitor.  She does not feel that she has had breakthrough symptoms.  She has had a recent chest x-ray that showed totally clear lung fields.  She has never had pulmonary function studies.   Review of Systems  Constitutional: Positive for unexpected weight change ( loss of appetie ). Negative for fever.  HENT: Positive for congestion, sore throat, rhinorrhea and postnasal drip. Negative for ear pain, nosebleeds, sneezing, trouble swallowing, dental problem and sinus pressure.   Eyes: Negative for redness and itching.  Respiratory: Positive for cough, chest tightness, shortness of breath and wheezing.   Cardiovascular: Positive for leg swelling. Negative for palpitations.  Gastrointestinal: Negative for nausea and vomiting.  Genitourinary: Negative for dysuria.  Musculoskeletal: Negative for joint swelling.  Skin: Negative for rash.  Neurological:  Negative for headaches.  Hematological: Does not bruise/bleed easily.  Psychiatric/Behavioral: Negative for dysphoric mood. The patient is not nervous/anxious.        Objective:   Physical Exam Constitutional:  Overweight female, no acute distress  HENT:  Nares patent without discharge, but swollen turbinates with edema, no purulence.   Oropharynx without exudate, palate and uvula are normal  Eyes:  Perrla, eomi, no scleral icterus  Neck:  No JVD, no TMG  Cardiovascular:  Normal rate, regular rhythm, no rubs or gallops.  No murmurs        Intact distal pulses  Pulmonary :  Normal breath sounds, no stridor or respiratory distress   No rales, rhonchi, or wheezing  Abdominal:  Soft, nondistended, bowel sounds present.  No tenderness noted.   Musculoskeletal:  mild lower extremity edema noted.  Lymph Nodes:  No cervical lymphadenopathy noted  Skin:  No cyanosis noted  Neurologic:  Alert, appropriate, moves all 4 extremities without obvious deficit.         Assessment & Plan:

## 2011-11-22 NOTE — Patient Instructions (Addendum)
Stay on your acid reflux medication. Take chlorpheniramine 4mg  (over the counter)  2 tabs each night at bedtime. Stop smoking.  This may not be what originally caused your cough, but may be keeping the cough from getting better by irritating your breathing passages. Will schedule for breathing studies to see if you have underlying copd.  Will see you back the same day as the testing so we can review.

## 2011-12-06 ENCOUNTER — Ambulatory Visit (HOSPITAL_COMMUNITY)
Admission: RE | Admit: 2011-12-06 | Discharge: 2011-12-06 | Disposition: A | Discharge status: Home / Self Care | Payer: Medicare Other | Source: Ambulatory Visit | Attending: Pulmonary Disease | Admitting: Pulmonary Disease

## 2011-12-06 ENCOUNTER — Encounter: Payer: Self-pay | Admitting: Pulmonary Disease

## 2011-12-06 ENCOUNTER — Ambulatory Visit (INDEPENDENT_AMBULATORY_CARE_PROVIDER_SITE_OTHER): Payer: Medicare Other | Admitting: Pulmonary Disease

## 2011-12-06 VITALS — BP 110/78 | HR 83 | Temp 98.3°F | Ht 62.0 in | Wt 186.2 lb

## 2011-12-06 DIAGNOSIS — R053 Chronic cough: Secondary | ICD-10-CM

## 2011-12-06 DIAGNOSIS — J4489 Other specified chronic obstructive pulmonary disease: Secondary | ICD-10-CM | POA: Insufficient documentation

## 2011-12-06 DIAGNOSIS — J988 Other specified respiratory disorders: Secondary | ICD-10-CM | POA: Insufficient documentation

## 2011-12-06 DIAGNOSIS — R0989 Other specified symptoms and signs involving the circulatory and respiratory systems: Secondary | ICD-10-CM | POA: Insufficient documentation

## 2011-12-06 DIAGNOSIS — R05 Cough: Secondary | ICD-10-CM

## 2011-12-06 DIAGNOSIS — J449 Chronic obstructive pulmonary disease, unspecified: Secondary | ICD-10-CM

## 2011-12-06 DIAGNOSIS — R059 Cough, unspecified: Secondary | ICD-10-CM

## 2011-12-06 DIAGNOSIS — R0609 Other forms of dyspnea: Secondary | ICD-10-CM | POA: Insufficient documentation

## 2011-12-06 MED ORDER — ALBUTEROL SULFATE (5 MG/ML) 0.5% IN NEBU
2.5000 mg | INHALATION_SOLUTION | Freq: Once | RESPIRATORY_TRACT | Status: AC
  Administered 2011-12-06: 2.5 mg via RESPIRATORY_TRACT

## 2011-12-06 NOTE — Patient Instructions (Addendum)
Will start you on symbicort 160/4.5  2 inhalations am and pm everyday no matter what.  Rinse mouth well after using. Stop smoking!  We may not be able to stop cough if you continue to do this. Please call me in 4 weeks with your response to the new medication.

## 2011-12-06 NOTE — Assessment & Plan Note (Signed)
The patient has mild airflow obstruction on PFTs today that I suspect is related to asthmatic bronchitis, and therefore most likely reversible.  I suspect that she has ongoing airway inflammation with significant mucus production related to her ongoing smoking.  I would like to try her on a bronchodilator regimen that includes inhaled corticosteroids, and I have stressed to her the importance of total smoking cessation.

## 2011-12-06 NOTE — Progress Notes (Signed)
  Subjective:    Patient ID: Karina [name], female    DOB: [birth year], 56 y.o.   MRN: [id number]  HPI The patient comes in today for followup after her recent pulmonary function studies.  She has had a chronic cough with some mucus production, and has not responded to treatment of postnasal drip and reflux disease.  Unfortunately, she continues to smoke.  She did have pulmonary function studies today, and this showed mild airflow obstruction, positive air trapping, no restriction, and a normal diffusion capacity.  I have reviewed the studies with her in detail, and answered all her questions.   Review of Systems  Constitutional: Negative for fever and unexpected weight change.  HENT: Positive for sinus pressure. Negative for ear pain, nosebleeds, congestion, sore throat, rhinorrhea, sneezing, trouble swallowing, dental problem and postnasal drip.        Ears stopped up  Eyes: Negative for redness and itching.  Respiratory: Positive for cough, chest tightness, shortness of breath and wheezing.   Cardiovascular: Negative for palpitations and leg swelling.  Gastrointestinal: Negative for nausea and vomiting.  Genitourinary: Negative for dysuria.  Musculoskeletal: Negative for joint swelling.  Skin: Negative for rash.  Neurological: Positive for headaches.  Hematological: Does not bruise/bleed easily.  Psychiatric/Behavioral: Negative for dysphoric mood. The patient is not nervous/anxious.        Objective:   Physical Exam Obese female in no acute distress Nose without purulence or discharge noted Chest with a few rhonchi, no wheezes Cardiac exam is regular rate and rhythm Lower extremities with minimal edema, no cyanosis Alert and oriented, moves all 4 extremities.       Assessment & Plan:

## 2011-12-06 NOTE — Assessment & Plan Note (Signed)
The patient did not respond to treatment for postnasal drip and reflux disease, and the question is raised whether her cough may be secondary to her chronic airway inflammation.  I also think your dictation from her ongoing smoking is playing a role as well.  Hopefully, her cough is going to improve with the LABA/ICS.

## 2011-12-21 ENCOUNTER — Ambulatory Visit (INDEPENDENT_AMBULATORY_CARE_PROVIDER_SITE_OTHER): Payer: Medicare Other | Admitting: Family Medicine

## 2011-12-21 ENCOUNTER — Encounter: Payer: Self-pay | Admitting: Family Medicine

## 2011-12-21 VITALS — BP 108/74 | HR 74 | Temp 98.2°F | Ht 62.0 in | Wt 182.0 lb

## 2011-12-21 DIAGNOSIS — L738 Other specified follicular disorders: Secondary | ICD-10-CM

## 2011-12-21 DIAGNOSIS — M546 Pain in thoracic spine: Secondary | ICD-10-CM

## 2011-12-21 DIAGNOSIS — J0100 Acute maxillary sinusitis, unspecified: Secondary | ICD-10-CM

## 2011-12-21 DIAGNOSIS — L678 Other hair color and hair shaft abnormalities: Secondary | ICD-10-CM

## 2011-12-21 DIAGNOSIS — M545 Low back pain, unspecified: Secondary | ICD-10-CM

## 2011-12-21 DIAGNOSIS — M549 Dorsalgia, unspecified: Secondary | ICD-10-CM | POA: Insufficient documentation

## 2011-12-21 DIAGNOSIS — L739 Follicular disorder, unspecified: Secondary | ICD-10-CM | POA: Insufficient documentation

## 2011-12-21 LAB — POCT URINALYSIS DIPSTICK
Bilirubin, UA: NEGATIVE
Glucose, UA: NEGATIVE
Ketones, UA: NEGATIVE
Leukocytes, UA: NEGATIVE
Nitrite, UA: NEGATIVE
Protein, UA: NEGATIVE
Spec Grav, UA: 1.01
Urobilinogen, UA: 0.2
pH, UA: 7

## 2011-12-21 MED ORDER — MUPIROCIN 2 % EX OINT: TOPICAL_OINTMENT | Freq: Two times a day (BID) | CUTANEOUS | Status: DC

## 2011-12-21 MED ORDER — AMOXICILLIN-POT CLAVULANATE 875-125 MG PO TABS: 1.0000 | ORAL_TABLET | Freq: Two times a day (BID) | ORAL | Status: DC

## 2011-12-21 NOTE — Patient Instructions (Addendum)
Your urine is clear today-no infection Try the ointment (bactroban)-on spots on legs and if no improvement get back to the dermatologist Take the augmentin for sinus infection , and also keep doing nasal washes Keep up the good work with smoking cessation

## 2011-12-21 NOTE — Progress Notes (Signed)
Subjective:    Patient ID: Karina [name], female    DOB: 1955-10-31, 56 y.o.   MRN: [id number]  HPI Here for urinary or sinus symptoms   Is having L flank pain for 2 days Was in hospital 4 weeks ago was tx uti then she did know she had No dysuria  Frequency of urination is chronic as is stress incontinence   Saw lung doctor for cough  Put her on symbacort-- thought due to her MS pill she was on  Still coughing up mucous   Has cut down to 1-2 cig per day  Is chewing nicotine   Also some sinus pain- under both eyes Worse on L side Is a little congested Nostrils feel very dry Doing nasal rinses Coughing continues   Has bumps on her L leg and buttock Wont go away and are sore   Patient Active Problem List  Diagnosis  . HYPOGLYCEMIA  . UNSPECIFIED VITAMIN D DEFICIENCY  . HYPERCHOLESTEROLEMIA  . Smoker  . ANXIETY, SITUATIONAL  . MULTIPLE SCLEROSIS  . NEUROPATHY-PERIPHERAL  . HEMORRHOIDS-EXTERNAL  . ALLERGIC RHINITIS  . LUNG NODULE  . GERD  . IRRITABLE BOWEL SYNDROME  . FIBROCYSTIC BREAST DISEASE  . OSTEOARTHRITIS, SHOULDER, RIGHT  . ARTHRALGIA  . BACK PAIN, LUMBAR, WITH RADICULOPATHY  . ROTATOR CUFF SYNDROME, RIGHT  . FOOT PAIN, BILATERAL  . SEIZURE DISORDER  . DIZZINESS OR VERTIGO  . SYMPTOM, EDEMA  . URINARY INCONTINENCE  . HYPERGLYCEMIA, BORDERLINE  . SYNCOPE, HX OF  . PERSONAL HX COLONIC POLYPS  . FOOT SURGERY, HX OF  . Other acquired absence of organ  . DILATION AND CURETTAGE, HX OF  . OTHER DISORDERS OF EYELID  . ABDOMINAL PAIN-MULTIPLE SITES  . Neck pain  . Abdominal pain, acute, epigastric  . Left-sided chest wall pain  . Bronchitis, acute, with bronchospasm  . Chronic cough  . Asthmatic bronchitis , chronic  . Mid back pain on left side  . Acute maxillary sinusitis  . Folliculitis   Past Medical History  Diagnosis Date  . Vertigo   . GERD (gastroesophageal reflux disease)   . Seizure disorder   . Urinary incontinence   . Plantar  fasciitis   . Colon polyps 08/2008    Adenomatous  . Gastritis 08/2008    H. pylori  . Lung nodule   . Menopausal disorder   . Anxiety     Councelor- Evalina Field  . Multiple sclerosis   . Allergic rhinitis   . Hypercholesteremia   . Diabetes mellitus    Past Surgical History  Procedure Date  . Cervical discectomy 2004    x 2, Anterior; Fusion C4-5, C5-6, C6-7, Dr. Donalee Citrin  . Tubal ligation   . Dilation and curettage of uterus   . Tonsillectomy   . Foot surgery     left  . Cholecystectomy   . Chest ct 11/2008    With small 4 mm nodule L lung base (rec re check in 1 year)  . Chest ct 07/2009    Re check chest CT - lung nodule stable  . Ct sinus ltd w/o cm 02/2009    negative  . Colonoscopy 08/2008    Polyps/ re check 5 yrs  . Esophagogastroduodenoscopy 08/2008    Erosive gastritis, h pylori (treated)  . Rotator cuff repair     right   History  Substance Use Topics  . Smoking status: Current Every Day Smoker -- 0.2 packs/day for 30 years    Types:  Cigarettes    Last Attempt to Quit: 11/07/2010  . Smokeless tobacco: Never Used   Comment: 2-3 cigarettes a day  . Alcohol Use: No   Family History  Problem Relation Age of Onset  . Migraines Mother   . Heart attack Mother   . Coronary artery disease Mother   . Coronary artery disease Father     6 bypasses   . Colon cancer Maternal Grandfather 80  . Coronary artery disease Cousin   . Coronary artery disease Paternal Grandmother   . Colon cancer Paternal Aunt 15  . Lung cancer Paternal Aunt    Allergies  Allergen Reactions  . Percocet (Oxycodone-Acetaminophen) Shortness Of Breath    Felt like going to pass out and sweating  . Tecfidera (Dimethyl Fumarate) Shortness Of Breath, Palpitations, Rash and Cough  . Amitriptyline Hcl     REACTION: swelling and itching  . Atorvastatin     REACTION: elevated LFT's  . Clarithromycin     REACTION: reaction not known  . Duloxetine     REACTION: pain  . Levofloxacin       REACTION: Rash  . Pregabalin     REACTION: LE swelling  . Pseudoephedrine     REACTION: legs hurt  . Zoloft (Sertraline Hcl) Other (See Comments)    Headaches    Current Outpatient Prescriptions on File Prior to Visit  Medication Sig Dispense Refill  . ALPRAZolam (XANAX) 0.5 MG tablet Take 0.5 mg by mouth 2 (two) times daily as needed. For anxiety.      . benzonatate (TESSALON) 100 MG capsule       . budesonide-formoterol (SYMBICORT) 160-4.5 MCG/ACT inhaler Inhale 2 puffs into the lungs 2 (two) times daily.      Marland Kitchen gabapentin (NEURONTIN) 300 MG capsule Take 600 mg by mouth at bedtime.       Marland Kitchen guaiFENesin (MUCINEX) 600 MG 12 hr tablet Take 1,200 mg by mouth 2 (two) times daily.      Marland Kitchen omeprazole (PRILOSEC) 40 MG capsule Take 1 capsule (40 mg total) by mouth 2 (two) times daily.  60 capsule  11  . rosuvastatin (CRESTOR) 20 MG tablet Take 10 mg by mouth daily.      . VENTOLIN HFA 108 (90 BASE) MCG/ACT inhaler INHALE 2 PUFFS UP TO EVERY 4 HOURS AS NEEDED FOR WHEEZING  1 Inhaler  11      Review of Systems Review of Systems  Constitutional: Negative for fever, appetite change, fatigue and unexpected weight change.  Eyes: Negative for pain and visual disturbance.  ENT pos for sinus pain and congestion and post nasal drip  Respiratory: Negative for sob and wheeze  Cardiovascular: Negative for cp or palpitations    Gastrointestinal: Negative for nausea, diarrhea and constipation.  Genitourinary: Negative for urgency and frequency. pos for low back pain  Skin: Negative for pallor or rash   Neurological: Negative for weakness, light-headedness, numbness and headaches.  Hematological: Negative for adenopathy. Does not bruise/bleed easily.  Psychiatric/Behavioral: Negative for dysphoric mood. The patient is not nervous/anxious.         Objective:   Physical Exam  Constitutional: She appears well-developed and well-nourished. No distress.  HENT:  Head: Normocephalic and atraumatic.   Right Ear: External ear normal.  Left Ear: External ear normal.  Mouth/Throat: Oropharynx is clear and moist. No oropharyngeal exudate.       Nares are injected and congested   bilat maxillary sinus tenderness  Eyes: Conjunctivae normal and EOM are normal. Pupils  are equal, round, and reactive to light. Right eye exhibits no discharge. Left eye exhibits no discharge.  Neck: Normal range of motion. Neck supple.  Cardiovascular: Normal rate, regular rhythm and normal heart sounds.   Pulmonary/Chest: Effort normal and breath sounds normal. No respiratory distress. She has no wheezes. She has no rales.       Diffusely distant bs   Abdominal: Soft. Bowel sounds are normal. She exhibits no distension and no mass. There is no tenderness.       No suprapubic tenderness or fullness    Musculoskeletal: Normal range of motion. She exhibits no edema and no tenderness.       No spinal tenderness No cva tenderness  Lymphadenopathy:    She has no cervical adenopathy.  Neurological: She is alert. She has normal reflexes.  Skin: Skin is warm and dry. No rash noted. No erythema. No pallor.       Some scabbed lesions on legs- excoriations  Psychiatric: She has a normal mood and affect.          Assessment & Plan:

## 2011-12-23 NOTE — Assessment & Plan Note (Signed)
ua negative  Exam normal  Think this was MSK  If symptoms return will call

## 2011-12-23 NOTE — Assessment & Plan Note (Signed)
Disc symptomatic care - see instructions on AVS  Will tx with augmentin Update if not starting to improve in a week or if worsening   Enc to quit smoking

## 2011-12-23 NOTE — Assessment & Plan Note (Signed)
Disc holding off on shaving Given bactroban to try  augmentin should also help  If not imp- will need to f/u with derm again

## 2012-04-27 ENCOUNTER — Telehealth: Payer: Self-pay | Admitting: *Deleted

## 2012-04-27 NOTE — Telephone Encounter (Signed)
Received request for prior auth on Crestor 20 mg. Form received and placed in your inbox

## 2012-04-28 NOTE — Telephone Encounter (Signed)
Approval letter received and placed in your inbox 

## 2012-04-28 NOTE — Telephone Encounter (Signed)
Prior auth faxed

## 2012-04-28 NOTE — Telephone Encounter (Signed)
Done and in IN box 

## 2012-07-19 ENCOUNTER — Other Ambulatory Visit: Payer: Self-pay

## 2012-07-19 MED ORDER — GABAPENTIN 300 MG PO CAPS: 600.0000 mg | ORAL_CAPSULE | Freq: Every day | ORAL | Status: DC

## 2012-07-19 NOTE — Telephone Encounter (Signed)
Former [facility] patient, has not been assigned new provider.  Auth refills via [facility]  

## 2012-09-13 ENCOUNTER — Ambulatory Visit (INDEPENDENT_AMBULATORY_CARE_PROVIDER_SITE_OTHER): Payer: Medicare Other | Admitting: Family Medicine

## 2012-09-13 ENCOUNTER — Encounter: Payer: Self-pay | Admitting: Family Medicine

## 2012-09-13 VITALS — BP 118/72 | HR 81 | Temp 98.3°F | Ht 62.0 in | Wt 176.5 lb

## 2012-09-13 DIAGNOSIS — R0600 Dyspnea, unspecified: Secondary | ICD-10-CM

## 2012-09-13 DIAGNOSIS — R609 Edema, unspecified: Secondary | ICD-10-CM

## 2012-09-13 DIAGNOSIS — R0609 Other forms of dyspnea: Secondary | ICD-10-CM

## 2012-09-13 DIAGNOSIS — R0989 Other specified symptoms and signs involving the circulatory and respiratory systems: Secondary | ICD-10-CM

## 2012-09-13 LAB — COMPREHENSIVE METABOLIC PANEL
ALT: 1 U/L (ref 0–35)
AST: 20 U/L (ref 0–37)
Albumin: 4.2 g/dL (ref 3.5–5.2)
Alkaline Phosphatase: 82 U/L (ref 39–117)
BUN: 9 mg/dL (ref 6–23)
CO2: 25 mEq/L (ref 19–32)
Calcium: 9.9 mg/dL (ref 8.4–10.5)
Chloride: 104 mEq/L (ref 96–112)
Creatinine, Ser: 1.2 mg/dL (ref 0.4–1.2)
GFR: 52.14 mL/min — ABNORMAL LOW (ref >60.00)
Glucose, Bld: 90 mg/dL (ref 70–99)
Potassium: 4.8 mEq/L (ref 3.5–5.1)
Sodium: 139 mEq/L (ref 135–145)
Total Bilirubin: 0 mg/dL — ABNORMAL LOW (ref 0.3–1.2)
Total Protein: 7.7 g/dL (ref 6.0–8.3)

## 2012-09-13 LAB — TSH: TSH: 0.36 u[IU]/mL (ref 0.35–5.50)

## 2012-09-13 LAB — BRAIN NATRIURETIC PEPTIDE: Pro B Natriuretic peptide (BNP): 27 pg/mL (ref 0.0–100.0)

## 2012-09-13 NOTE — Patient Instructions (Addendum)
For swelling today I am going to check a blood test for heart failure along with kidney and liver function and thyroid tests Elevate you legs when you can /avoid high sodium foods and drink much more water Of note: gabapentin does cause fluid retention  Will call you with a plan  I will review your cardiology notes in the meantime Continue your lasix

## 2012-09-13 NOTE — Progress Notes (Signed)
Subjective:    Patient ID: Karina [name], female    DOB: [birth year], 57 y.o.   MRN: [id number]  HPI Here with edema "all over"  She saw pulmonary in the past for fluid in lungs- never found out why  Has been here once before - BNP neg/ cxr was ok and tried lasix and it did not work very well  Still takes lasix - ? Mg , thinks 20 mg and helps a little bit   Feels it in legs and hands and stomach and all over  Is short of breath -does not think it is from smoking  More sob the more swelling she gets Some PND / orthopnea    Last echo long time ago  ? If a chemo type drug for MS may have damaged her heart   Patient Active Problem List   Diagnosis Date Noted  . Dyspnea 09/13/2012  . Mid back pain on left side 12/21/2011  . Acute maxillary sinusitis 12/21/2011  . Folliculitis 12/21/2011  . Asthmatic bronchitis , chronic 12/06/2011  . Chronic cough 11/22/2011  . Bronchitis, acute, with bronchospasm 10/22/2011  . Abdominal pain, acute, epigastric 08/24/2011  . Left-sided chest wall pain 08/24/2011  . Neck pain 10/12/2010  . OTHER DISORDERS OF EYELID 04/02/2010  . ANXIETY, SITUATIONAL 07/17/2009  . BACK PAIN, LUMBAR, WITH RADICULOPATHY 12/27/2008  . HEMORRHOIDS-EXTERNAL 11/07/2008  . IRRITABLE BOWEL SYNDROME 11/07/2008  . LUNG NODULE 10/14/2008  . HYPERGLYCEMIA, BORDERLINE 10/14/2008  . PERSONAL HX COLONIC POLYPS 10/08/2008  . ABDOMINAL PAIN-MULTIPLE SITES 10/08/2008  . UNSPECIFIED VITAMIN D DEFICIENCY 07/23/2008  . SYNCOPE, HX OF 06/01/2008  . FOOT SURGERY, HX OF 06/01/2008  . Other acquired absence of organ 06/01/2008  . DILATION AND CURETTAGE, HX OF 06/01/2008  . OSTEOARTHRITIS, SHOULDER, RIGHT 03/22/2008  . ROTATOR CUFF SYNDROME, RIGHT 03/22/2008  . ARTHRALGIA 12/12/2007  . ALLERGIC RHINITIS 08/01/2007  . Smoker 10/17/2006  . NEUROPATHY-PERIPHERAL 10/17/2006  . FOOT PAIN, BILATERAL 10/17/2006  . SYMPTOM, EDEMA 10/03/2006  . HYPOGLYCEMIA 09/30/2006  .  HYPERCHOLESTEROLEMIA 09/30/2006  . MULTIPLE SCLEROSIS 09/30/2006  . GERD 09/30/2006  . FIBROCYSTIC BREAST DISEASE 09/30/2006  . SEIZURE DISORDER 09/30/2006  . DIZZINESS OR VERTIGO 09/30/2006  . URINARY INCONTINENCE 09/30/2006   Past Medical History  Diagnosis Date  . Vertigo   . GERD (gastroesophageal reflux disease)   . Seizure disorder   . Urinary incontinence   . Plantar fasciitis   . Colon polyps 08/2008    Adenomatous  . Gastritis 08/2008    H. pylori  . Lung nodule   . Menopausal disorder   . Anxiety     Councelor- Evalina Field  . Multiple sclerosis   . Allergic rhinitis   . Hypercholesteremia   . Diabetes mellitus    Past Surgical History  Procedure Laterality Date  . Cervical discectomy  2004    x 2, Anterior; Fusion C4-5, C5-6, C6-7, Dr. Donalee Citrin  . Tubal ligation    . Dilation and curettage of uterus    . Tonsillectomy    . Foot surgery      left  . Cholecystectomy    . Chest ct  11/2008    With small 4 mm nodule L lung base (rec re check in 1 year)  . Chest ct  07/2009    Re check chest CT - lung nodule stable  . Ct sinus ltd w/o cm  02/2009    negative  . Colonoscopy  08/2008    Polyps/ re check  5 yrs  . Esophagogastroduodenoscopy  08/2008    Erosive gastritis, h pylori (treated)  . Rotator cuff repair      right   History  Substance Use Topics  . Smoking status: Current Every Day Smoker -- 0.20 packs/day for 30 years    Types: Cigarettes    Last Attempt to Quit: 11/07/2010  . Smokeless tobacco: Never Used     Comment: 2-3 cigarettes a day  . Alcohol Use: No   Family History  Problem Relation Age of Onset  . Migraines Mother   . Heart attack Mother   . Coronary artery disease Mother   . Coronary artery disease Father     6 bypasses   . Colon cancer Maternal Grandfather 80  . Coronary artery disease Cousin   . Coronary artery disease Paternal Grandmother   . Colon cancer Paternal Aunt 84  . Lung cancer Paternal Aunt    Allergies   Allergen Reactions  . Percocet (Oxycodone-Acetaminophen) Shortness Of Breath    Felt like going to pass out and sweating  . Tecfidera (Dimethyl Fumarate) Shortness Of Breath, Palpitations, Rash and Cough  . Amitriptyline Hcl     REACTION: swelling and itching  . Atorvastatin     REACTION: elevated LFT's  . Clarithromycin     REACTION: reaction not known  . Duloxetine     REACTION: pain  . Levofloxacin     REACTION: Rash  . Pregabalin     REACTION: LE swelling  . Pseudoephedrine     REACTION: legs hurt  . Zoloft (Sertraline Hcl) Other (See Comments)    Headaches    Current Outpatient Prescriptions on File Prior to Visit  Medication Sig Dispense Refill  . ALPRAZolam (XANAX) 0.5 MG tablet Take 0.5 mg by mouth 2 (two) times daily as needed. For anxiety.      . gabapentin (NEURONTIN) 300 MG capsule Take 2 capsules (600 mg total) by mouth at bedtime.  180 capsule  2  . guaiFENesin (MUCINEX) 600 MG 12 hr tablet Take 1,200 mg by mouth as needed.       Marland Kitchen omeprazole (PRILOSEC) 40 MG capsule Take 1 capsule (40 mg total) by mouth 2 (two) times daily.  60 capsule  11  . VENTOLIN HFA 108 (90 BASE) MCG/ACT inhaler INHALE 2 PUFFS UP TO EVERY 4 HOURS AS NEEDED FOR WHEEZING  1 Inhaler  11  . budesonide-formoterol (SYMBICORT) 160-4.5 MCG/ACT inhaler Inhale 2 puffs into the lungs 2 (two) times daily.      . rosuvastatin (CRESTOR) 20 MG tablet Take 10 mg by mouth daily.       No current facility-administered medications on file prior to visit.    Review of Systems    Review of Systems  Constitutional: Negative for fever, appetite change,  and unexpected weight change.  Eyes: Negative for pain and visual disturbance.  Respiratory: Negative for cough and pos for sob on exertion   Cardiovascular: Negative for cp or palpitations   pos for self rep PND and orthopnea and edema pedal Gastrointestinal: Negative for nausea, diarrhea and constipation.  Genitourinary: Negative for urgency and frequency.   Skin: Negative for pallor or rash   Neurological: Negative for weakness, light-headedness, numbness and headaches.  Hematological: Negative for adenopathy. Does not bruise/bleed easily.  Psychiatric/Behavioral: Negative for dysphoric mood. The patient is not nervous/anxious.      Objective:   Physical Exam  Constitutional: She appears well-developed and well-nourished. No distress.  HENT:  Head: Normocephalic and atraumatic.  Mouth/Throat: Oropharynx is clear and moist.  Eyes: Conjunctivae and EOM are normal. Pupils are equal, round, and reactive to light. Right eye exhibits no discharge. Left eye exhibits no discharge. No scleral icterus.  Neck: Normal range of motion. Neck supple. No JVD present. Carotid bruit is not present. No thyromegaly present.  Cardiovascular: Normal rate, regular rhythm and normal heart sounds.  Exam reveals no gallop.   Pulmonary/Chest: Breath sounds normal. No respiratory distress. She has no wheezes. She has no rales.  Abdominal: Soft. Bowel sounds are normal. She exhibits no distension and no mass. There is no tenderness.  Musculoskeletal: She exhibits edema. She exhibits no tenderness.  Puffiness without pitting in ankles  No facial swelling noted  No joint changes   Lymphadenopathy:    She has no cervical adenopathy.  Neurological: She is alert. She has normal reflexes.  Skin: Skin is warm and dry. No rash noted. No erythema. No pallor.  Psychiatric: She has a normal mood and affect.          Assessment & Plan:

## 2012-09-14 ENCOUNTER — Telehealth: Payer: Self-pay | Admitting: Family Medicine

## 2012-09-14 DIAGNOSIS — R0600 Dyspnea, unspecified: Secondary | ICD-10-CM

## 2012-09-14 DIAGNOSIS — R609 Edema, unspecified: Secondary | ICD-10-CM

## 2012-09-14 NOTE — Telephone Encounter (Signed)
Message copied by Judy Pimple on Thu Sep 14, 2012  7:18 PM ------      Message from: Shon Millet      Created: Thu Sep 14, 2012  3:41 PM       Pt notified of lab results and agrees she need to see a cardiologist but she hasn't been to one in a while and would like you to put a referral in for one, pt said she has seen Dr. Mariah Milling in [place] the past and wouldn't mind going back to him, I advised Shirlee Limerick of this and she will call and get an appt set up she just needs you to put a referral in ------

## 2012-09-14 NOTE — Telephone Encounter (Signed)
Ref for cardiol

## 2012-09-14 NOTE — Assessment & Plan Note (Signed)
Pt strongly urged to quit smoking She does notice exertional symptoms and  ? PND and orthopnea Check BNP Consider re check with cardiol

## 2012-09-14 NOTE — Assessment & Plan Note (Signed)
Has had this in the past with neg w/u- now c/o it is worse despite lasix  Exam does not correlate with subj desc of her symptoms however Some new cardiac symptoms  Disc poss etiol - gabapendin Disc leg elevation/ salt rest and inc in water intake to help - and given copy of dash diet info Lab today

## 2012-09-20 ENCOUNTER — Encounter: Payer: Self-pay | Admitting: Cardiovascular Disease

## 2012-09-20 ENCOUNTER — Ambulatory Visit (INDEPENDENT_AMBULATORY_CARE_PROVIDER_SITE_OTHER): Payer: Medicare Other | Admitting: Cardiovascular Disease

## 2012-09-20 VITALS — BP 110/86 | HR 84 | Ht 63.0 in | Wt 177.5 lb

## 2012-09-20 DIAGNOSIS — R079 Chest pain, unspecified: Secondary | ICD-10-CM | POA: Insufficient documentation

## 2012-09-20 DIAGNOSIS — R0600 Dyspnea, unspecified: Secondary | ICD-10-CM

## 2012-09-20 DIAGNOSIS — R0989 Other specified symptoms and signs involving the circulatory and respiratory systems: Secondary | ICD-10-CM

## 2012-09-20 DIAGNOSIS — R0609 Other forms of dyspnea: Secondary | ICD-10-CM

## 2012-09-20 DIAGNOSIS — R609 Edema, unspecified: Secondary | ICD-10-CM

## 2012-09-20 DIAGNOSIS — F438 Other reactions to severe stress: Secondary | ICD-10-CM

## 2012-09-20 DIAGNOSIS — R0602 Shortness of breath: Secondary | ICD-10-CM | POA: Insufficient documentation

## 2012-09-20 DIAGNOSIS — F17200 Nicotine dependence, unspecified, uncomplicated: Secondary | ICD-10-CM

## 2012-09-20 NOTE — Assessment & Plan Note (Signed)
By her report, she has significant stress. She is meeting with lawyers to discuss her father's affairs. Perhaps this could be contributing to her symptoms.

## 2012-09-20 NOTE — Assessment & Plan Note (Signed)
Etiology of her shortness of breath and chest pain is uncertain. Echocardiogram has been ordered to exclude cardiac pathology. This will estimate right ventricular systolic pressures. Grossly, no significant pitting edema on exam. If normal echocardiogram, normal stress test, symptoms could be secondary to underlying lung disease.

## 2012-09-20 NOTE — Patient Instructions (Addendum)
We will schedule you a stress test , lexiscan  Also an echocardiogram for shortness of breath and chest pain  Please call us if you have new issues that need to be addressed before your next appt.   [facility] MYOVIEW  Your caregiver has ordered a Stress Test with nuclear imaging. The purpose of this test is to evaluate the blood supply to your heart muscle. This procedure is referred to as a "Non-Invasive Stress Test." This is because other than having an IV started in your vein, nothing is inserted or "invades" your body. Cardiac stress tests are done to find areas of poor blood flow to the heart by determining the extent of coronary artery disease (CAD). Some patients exercise on a treadmill, which naturally increases the blood flow to your heart, while others who are  unable to walk on a treadmill due to physical limitations have a pharmacologic/chemical stress agent called Lexiscan . This medicine will mimic walking on a treadmill by temporarily increasing your coronary blood flow.   Please note: these test may take anywhere between 2-4 hours to complete  PLEASE REPORT TO [facility] MEDICAL MALL ENTRANCE  THE VOLUNTEERS AT THE FIRST DESK WILL DIRECT YOU WHERE TO GO  Date of Procedure:_________July 22, 2014_________________  Arrival Time for Procedure:_____7:15 am.  _________________  Instructions regarding medication:    _x___:  Hold other medications as follows:_____Do not take you fluid pill (furosemide) the am of procedure.    PLEASE NOTIFY THE OFFICE AT LEAST 24 HOURS IN ADVANCE IF YOU ARE UNABLE TO KEEP YOUR APPOINTMENT.  [phone]  How to prepare for your Myoview test:  1. Do not eat or drink after midnight 2. No caffeine for 24 hours prior to test 3. No smoking 24 hours prior to test. 4. Your medication may be taken with water.  If your doctor stopped a medication because of this test, do not take that medication. 5. Ladies, please do not wear dresses.  Skirts or pants are  appropriate. Please wear a short sleeve shirt. 6. No perfume, cologne or lotion. 7. Wear comfortable walking shoes. No heels!

## 2012-09-20 NOTE — Assessment & Plan Note (Signed)
Chest pain is somewhat atypical in nature. She states that she is unable to treadmill given her shortness of breath, leg pain. She is willing to have a pharmacologic Myoview for her chest pain to exclude ischemia.

## 2012-09-20 NOTE — Progress Notes (Signed)
Patient ID: Karina [name], female    DOB: [birth year], 57 y.o.   MRN: [id number]  HPI Comments: Karina [name] is a 57 year old woman with a long smoking history from age [age], history of asthma, multiple sclerosis, previous history of chest pain with negative stress test in 08-08-2007 (nuclear treadmill study), who presents by referral from Dr. Ermalene Searing for chest pain, swelling.  The patient reports that last August 2013 she was started on a new medication for her MS. After being on this for 7 days, she felt poorly. She had fluid retention, abdominal swelling. Medication was held. Since then she has continued to have swelling in her upper tremors, legs, feet, stomach, "all over". She's not feel well in general. She has symptoms of chest pain, also pain in her shoulder blades with exertion. She uses nebulizers at home for her asthma. She reports having a cough that is thick, productive. She wonders if she has underlying infection.  She reports that she is under significant stress. Father died in Aug 08, 2011. She is meeting with lawyers discuss his affairs. This has been stressful.  EKG shows normal sinus rhythm with no significant ST or T wave changes, rate 84 beats per minute   Outpatient Encounter Prescriptions as of 09/20/2012  Medication Sig Dispense Refill  . ALPRAZolam (XANAX) 0.5 MG tablet Take 0.5 mg by mouth 2 (two) times daily as needed. For anxiety.      . budesonide-formoterol (SYMBICORT) 160-4.5 MCG/ACT inhaler Inhale 2 puffs into the lungs 2 (two) times daily.      . furosemide (LASIX) 20 MG tablet Take 20 mg by mouth daily.      Marland Kitchen gabapentin (NEURONTIN) 300 MG capsule Take 2 capsules (600 mg total) by mouth at bedtime.  180 capsule  2  . guaiFENesin (MUCINEX) 600 MG 12 hr tablet Take 1,200 mg by mouth as needed.       Marland Kitchen omeprazole (PRILOSEC) 40 MG capsule Take 1 capsule (40 mg total) by mouth 2 (two) times daily.  60 capsule  11  . VENTOLIN HFA 108 (90 BASE) MCG/ACT inhaler INHALE 2 PUFFS UP TO  EVERY 4 HOURS AS NEEDED FOR WHEEZING  1 Inhaler  11    Review of Systems  Constitutional: Negative.   HENT: Negative.   Eyes: Negative.   Respiratory: Negative.   Cardiovascular: Positive for chest pain and leg swelling.  Gastrointestinal: Negative.   Musculoskeletal: Negative.   Skin: Negative.   Neurological: Negative.   Psychiatric/Behavioral: Negative.   All other systems reviewed and are negative.    BP 110/86  Pulse 84  Ht 5\' 3"  (1.6 m)  Wt 177 lb 8 oz (80.513 kg)  BMI 31.45 kg/m2  Physical Exam  Nursing note and vitals reviewed. Constitutional: She is oriented to person, place, and time. She appears well-developed and well-nourished.  HENT:  Head: Normocephalic.  Nose: Nose normal.  Mouth/Throat: Oropharynx is clear and moist.  Eyes: Conjunctivae are normal. Pupils are equal, round, and reactive to light.  Neck: Normal range of motion. Neck supple. No JVD present.  Cardiovascular: Normal rate, regular rhythm, S1 normal, S2 normal, normal heart sounds and intact distal pulses.  Exam reveals no gallop and no friction rub.   No murmur heard. Pulmonary/Chest: Effort normal and breath sounds normal. No respiratory distress. She has no wheezes. She has no rales. She exhibits no tenderness.  Abdominal: Soft. Bowel sounds are normal. She exhibits no distension. There is no tenderness.  Musculoskeletal: Normal range of motion. She exhibits  no edema and no tenderness.  Lymphadenopathy:    She has no cervical adenopathy.  Neurological: She is alert and oriented to person, place, and time. Coordination normal.  Skin: Skin is warm and dry. No rash noted. No erythema.  Psychiatric: She has a normal mood and affect. Her behavior is normal. Judgment and thought content normal.    Assessment and Plan

## 2012-09-20 NOTE — Assessment & Plan Note (Signed)
We have encouraged her to continue to work on weaning her cigarettes and smoking cessation. She will continue to work on this and does not want any assistance with chantix.  

## 2012-09-27 ENCOUNTER — Ambulatory Visit: Payer: Self-pay | Admitting: Cardiovascular Disease

## 2012-09-27 DIAGNOSIS — R079 Chest pain, unspecified: Secondary | ICD-10-CM

## 2012-09-28 ENCOUNTER — Other Ambulatory Visit: Payer: Self-pay

## 2012-09-28 DIAGNOSIS — R079 Chest pain, unspecified: Secondary | ICD-10-CM

## 2012-09-28 DIAGNOSIS — R0602 Shortness of breath: Secondary | ICD-10-CM

## 2012-10-03 ENCOUNTER — Other Ambulatory Visit (INDEPENDENT_AMBULATORY_CARE_PROVIDER_SITE_OTHER): Payer: Medicare Other

## 2012-10-03 ENCOUNTER — Other Ambulatory Visit: Payer: Self-pay

## 2012-10-03 DIAGNOSIS — R0602 Shortness of breath: Secondary | ICD-10-CM

## 2012-10-03 DIAGNOSIS — R079 Chest pain, unspecified: Secondary | ICD-10-CM

## 2012-10-04 ENCOUNTER — Ambulatory Visit (INDEPENDENT_AMBULATORY_CARE_PROVIDER_SITE_OTHER)
Admission: RE | Admit: 2012-10-04 | Discharge: 2012-10-04 | Disposition: A | Discharge status: Home / Self Care | Payer: Medicare Other | Source: Ambulatory Visit | Attending: Family Medicine | Admitting: Family Medicine

## 2012-10-04 ENCOUNTER — Encounter: Payer: Self-pay | Admitting: Family Medicine

## 2012-10-04 ENCOUNTER — Ambulatory Visit (INDEPENDENT_AMBULATORY_CARE_PROVIDER_SITE_OTHER): Payer: Medicare Other | Admitting: Family Medicine

## 2012-10-04 VITALS — BP 106/74 | HR 77 | Temp 98.6°F | Ht 62.0 in | Wt 176.5 lb

## 2012-10-04 DIAGNOSIS — S8012XA Contusion of left lower leg, initial encounter: Secondary | ICD-10-CM

## 2012-10-04 DIAGNOSIS — S8010XA Contusion of unspecified lower leg, initial encounter: Secondary | ICD-10-CM

## 2012-10-04 NOTE — Patient Instructions (Addendum)
Xray today  Elevate your leg when you can - and use a gentle warm compress  Update me if pain or swelling worsen We will call you with a result

## 2012-10-04 NOTE — Progress Notes (Signed)
Subjective:    Patient ID: Karina [name], female    DOB: 1955/08/24, 57 y.o.   MRN: [id number]  HPI Here for f/u after a fall   (sat night)- swelled immediately and she put ice on it  Lost her balance in a trailer - and fell forward into a bathtub (slipped) - her shin hit something hard - did not break the skin  Very large bruise - and very sore  Pain to walk on it - just starting to walk Did not twist her knee  L leg is very bruised   Nl rom of her hip No numbness or coldness of foot  Pain is not severe   Patient Active Problem List   Diagnosis Date Noted  . Chest pain 09/20/2012  . SOB (shortness of breath) 09/20/2012  . Dyspnea 09/13/2012  . Asthmatic bronchitis , chronic 12/06/2011  . Chronic cough 11/22/2011  . OTHER DISORDERS OF EYELID 04/02/2010  . ANXIETY, SITUATIONAL 07/17/2009  . BACK PAIN, LUMBAR, WITH RADICULOPATHY 12/27/2008  . HEMORRHOIDS-EXTERNAL 11/07/2008  . IRRITABLE BOWEL SYNDROME 11/07/2008  . LUNG NODULE 10/14/2008  . HYPERGLYCEMIA, BORDERLINE 10/14/2008  . PERSONAL HX COLONIC POLYPS 10/08/2008  . UNSPECIFIED VITAMIN D DEFICIENCY 07/23/2008  . SYNCOPE, HX OF 06/01/2008  . FOOT SURGERY, HX OF 06/01/2008  . DILATION AND CURETTAGE, HX OF 06/01/2008  . OSTEOARTHRITIS, SHOULDER, RIGHT 03/22/2008  . ROTATOR CUFF SYNDROME, RIGHT 03/22/2008  . ARTHRALGIA 12/12/2007  . ALLERGIC RHINITIS 08/01/2007  . Smoker 10/17/2006  . NEUROPATHY-PERIPHERAL 10/17/2006  . FOOT PAIN, BILATERAL 10/17/2006  . SYMPTOM, EDEMA 10/03/2006  . HYPOGLYCEMIA 09/30/2006  . HYPERCHOLESTEROLEMIA 09/30/2006  . MULTIPLE SCLEROSIS 09/30/2006  . GERD 09/30/2006  . FIBROCYSTIC BREAST DISEASE 09/30/2006  . SEIZURE DISORDER 09/30/2006  . DIZZINESS OR VERTIGO 09/30/2006  . URINARY INCONTINENCE 09/30/2006   Past Medical History  Diagnosis Date  . Vertigo   . GERD (gastroesophageal reflux disease)   . Seizure disorder   . Urinary incontinence   . Plantar fasciitis   . Colon polyps  08/2008    Adenomatous  . Gastritis 08/2008    H. pylori  . Lung nodule   . Menopausal disorder   . Anxiety     [facility]- Eval[facility] Field  . Multiple sclerosis   . Allergic rhinitis   . Hypercholesteremia   . Diabetes mellitus    Past Surgical History  Procedure Laterality Date  . Cervical discectomy  2004    x 2, Anterior; Fusion C4-5, C5-6, C6-7, Dr. Donalee Citrin  . Tubal ligation    . Dilation and curettage of uterus    . Tonsillectomy    . Foot surgery      left  . Cholecystectomy    . Chest ct  11/2008    With small 4 mm nodule L lung base (rec re check in 1 year)  . Chest ct  07/2009    Re check chest CT - lung nodule stable  . Ct sinus ltd w/o cm  02/2009    negative  . Colonoscopy  08/2008    Polyps/ re check 5 yrs  . Esophagogastroduodenoscopy  08/2008    Erosive gastritis, h pylori (treated)  . Rotator cuff repair      right   History  Substance Use Topics  . Smoking status: Current Every Day Smoker -- 0.20 packs/day for 30 years    Types: Cigarettes    Last Attempt to Quit: 11/07/2010  . Smokeless tobacco: Never Used     Comment:  2-3 cigarettes a day  . Alcohol Use: No   Family History  Problem Relation Age of Onset  . Migraines Mother   . Heart attack Mother   . Coronary artery disease Mother   . Coronary artery disease Father     6 bypasses   . Colon cancer Maternal Grandfather 80  . Coronary artery disease Cousin   . Coronary artery disease Paternal Grandmother   . Colon cancer Paternal Aunt 70  . Lung cancer Paternal Aunt    Allergies  Allergen Reactions  . Percocet (Oxycodone-Acetaminophen) Shortness Of Breath    Felt like going to pass out and sweating  . Tecfidera (Dimethyl Fumarate) Shortness Of Breath, Palpitations, Rash and Cough  . Amitriptyline Hcl     REACTION: swelling and itching  . Atorvastatin     REACTION: elevated LFT's  . Clarithromycin     REACTION: reaction not known  . Duloxetine     REACTION: pain  . Levofloxacin      REACTION: Rash  . Pregabalin     REACTION: LE swelling  . Pseudoephedrine     REACTION: legs hurt  . Zoloft (Sertraline Hcl) Other (See Comments)    Headaches    Current Outpatient Prescriptions on File Prior to Visit  Medication Sig Dispense Refill  . ALPRAZolam (XANAX) 0.5 MG tablet Take 0.5 mg by mouth 2 (two) times daily as needed. For anxiety.      . budesonide-formoterol (SYMBICORT) 160-4.5 MCG/ACT inhaler Inhale 2 puffs into the lungs 2 (two) times daily.      . furosemide (LASIX) 20 MG tablet Take 20 mg by mouth daily.      Marland Kitchen gabapentin (NEURONTIN) 300 MG capsule Take 2 capsules (600 mg total) by mouth at bedtime.  180 capsule  2  . guaiFENesin (MUCINEX) 600 MG 12 hr tablet Take 1,200 mg by mouth as needed.       Marland Kitchen omeprazole (PRILOSEC) 40 MG capsule Take 1 capsule (40 mg total) by mouth 2 (two) times daily.  60 capsule  11  . VENTOLIN HFA 108 (90 BASE) MCG/ACT inhaler INHALE 2 PUFFS UP TO EVERY 4 HOURS AS NEEDED FOR WHEEZING  1 Inhaler  11   No current facility-administered medications on file prior to visit.    Review of Systems    Review of Systems  Constitutional: Negative for fever, appetite change, fatigue and unexpected weight change.  Eyes: Negative for pain and visual disturbance.  Respiratory: Negative for cough and shortness of breath.   Cardiovascular: Negative for cp or palpitations    Gastrointestinal: Negative for nausea, diarrhea and constipation.  Genitourinary: Negative for urgency and frequency.  Skin: Negative for pallor or rash   MSK pos for left leg pain , neg for joint swelling  Neurological: Negative for weakness, light-headedness, and headaches.  Hematological: Negative for adenopathy. Does not bruise/bleed easily.  Psychiatric/Behavioral: Negative for dysphoric mood. The patient is not nervous/anxious.      Objective:   Physical Exam  Constitutional: She appears well-developed and well-nourished. No distress.  obese and well appearing   HENT:   Head: Normocephalic and atraumatic.  Neck: Normal range of motion. Neck supple.  Cardiovascular: Normal rate and regular rhythm.   Musculoskeletal: She exhibits edema and tenderness.  Lateral left shin - large hematoma that is evolving - blue and yellow- with swelling and mild tenderness  Nl perf of extremity  Nl sens and rom  Nl rom hip and foot as well   No erythema /  warmth or sign of infx  Neurological: She is alert. She has normal reflexes.  Skin: Skin is warm and dry. No erythema.  Ecchymosis L lat lower leg  Psychiatric: She has a normal mood and affect.          Assessment & Plan:

## 2012-10-05 ENCOUNTER — Telehealth: Payer: Self-pay

## 2012-10-05 NOTE — Telephone Encounter (Signed)
Echo results given see results in EPIC.

## 2012-10-05 NOTE — Assessment & Plan Note (Signed)
With evolving hematoma and no s/s of compartment syndrome  Recommend elevation and warm compresses  Bear wt as tolerated   Xray shows no fx today

## 2012-10-05 NOTE — Telephone Encounter (Signed)
Pt would like echo results 

## 2013-01-11 ENCOUNTER — Other Ambulatory Visit: Payer: Self-pay

## 2013-01-23 ENCOUNTER — Telehealth: Payer: Self-pay | Admitting: Family Medicine

## 2013-01-23 DIAGNOSIS — E559 Vitamin D deficiency, unspecified: Secondary | ICD-10-CM

## 2013-01-23 DIAGNOSIS — R7309 Other abnormal glucose: Secondary | ICD-10-CM

## 2013-01-23 DIAGNOSIS — Z0000 Encounter for general adult medical examination without abnormal findings: Secondary | ICD-10-CM | POA: Insufficient documentation

## 2013-01-23 DIAGNOSIS — R609 Edema, unspecified: Secondary | ICD-10-CM

## 2013-01-23 NOTE — Telephone Encounter (Signed)
Message copied by Judy Pimple on Tue Jan 23, 2013  8:04 AM ------      Message from: Alvina Chou      Created: Wed Jan 17, 2013  3:02 PM      Regarding: Lab orders for Wednesday 11.19.14       Patient is scheduled for CPX labs, please order future labs, Thanks , Karina [name]       ------

## 2013-01-24 ENCOUNTER — Other Ambulatory Visit (INDEPENDENT_AMBULATORY_CARE_PROVIDER_SITE_OTHER): Payer: Medicare Other

## 2013-01-24 DIAGNOSIS — E7800 Pure hypercholesterolemia, unspecified: Secondary | ICD-10-CM

## 2013-01-24 DIAGNOSIS — E559 Vitamin D deficiency, unspecified: Secondary | ICD-10-CM

## 2013-01-24 DIAGNOSIS — Z0000 Encounter for general adult medical examination without abnormal findings: Secondary | ICD-10-CM

## 2013-01-24 DIAGNOSIS — E162 Hypoglycemia, unspecified: Secondary | ICD-10-CM

## 2013-01-24 DIAGNOSIS — R7309 Other abnormal glucose: Secondary | ICD-10-CM

## 2013-01-24 DIAGNOSIS — R609 Edema, unspecified: Secondary | ICD-10-CM

## 2013-01-24 LAB — COMPREHENSIVE METABOLIC PANEL
ALT: 18 U/L (ref 0–35)
AST: 21 U/L (ref 0–37)
Albumin: 3.9 g/dL (ref 3.5–5.2)
Alkaline Phosphatase: 76 U/L (ref 39–117)
BUN: 12 mg/dL (ref 6–23)
CO2: 27 mEq/L (ref 19–32)
Calcium: 9.5 mg/dL (ref 8.4–10.5)
Chloride: 105 mEq/L (ref 96–112)
Creatinine, Ser: 1.1 mg/dL (ref 0.4–1.2)
GFR: 55.98 mL/min — ABNORMAL LOW (ref >60.00)
Glucose, Bld: 102 mg/dL — ABNORMAL HIGH (ref 70–99)
Potassium: 4.7 mEq/L (ref 3.5–5.1)
Sodium: 138 mEq/L (ref 135–145)
Total Bilirubin: 0.6 mg/dL (ref 0.3–1.2)
Total Protein: 7.3 g/dL (ref 6.0–8.3)

## 2013-01-24 LAB — CBC WITH DIFFERENTIAL/PLATELET
Basophils Absolute: 0.1 10*3/uL (ref 0.0–0.1)
Basophils Relative: 0.9 % (ref 0.0–3.0)
Eosinophils Absolute: 0.2 10*3/uL (ref 0.0–0.7)
Eosinophils Relative: 2.3 % (ref 0.0–5.0)
HCT: 44.8 % (ref 36.0–46.0)
Hemoglobin: 15 g/dL (ref 12.0–15.0)
Lymphocytes Relative: 40 % (ref 12.0–46.0)
Lymphs Abs: 3.4 10*3/uL (ref 0.7–4.0)
MCHC: 33.5 g/dL (ref 30.0–36.0)
MCV: 95.6 fl (ref 78.0–100.0)
Monocytes Absolute: 0.5 10*3/uL (ref 0.1–1.0)
Monocytes Relative: 5.8 % (ref 3.0–12.0)
Neutro Abs: 4.4 10*3/uL (ref 1.4–7.7)
Neutrophils Relative %: 51 % (ref 43.0–77.0)
Platelets: 203 10*3/uL (ref 150.0–400.0)
RBC: 4.68 Mil/uL (ref 3.87–5.11)
RDW: 14.4 % (ref 11.5–14.6)
WBC: 8.6 10*3/uL (ref 4.5–10.5)

## 2013-01-24 LAB — LIPID PANEL
Cholesterol: 276 mg/dL — ABNORMAL HIGH (ref 0–200)
HDL: 57.9 mg/dL (ref >39.00)
Total CHOL/HDL Ratio: 5
Triglycerides: 107 mg/dL (ref 0.0–149.0)
VLDL: 21.4 mg/dL (ref 0.0–40.0)

## 2013-01-24 LAB — TSH: TSH: 1.64 u[IU]/mL (ref 0.35–5.50)

## 2013-01-24 LAB — HEMOGLOBIN A1C: Hgb A1c MFr Bld: 5.9 % (ref 4.6–6.5)

## 2013-01-24 LAB — LDL CHOLESTEROL, DIRECT: Direct LDL: 206.5 mg/dL

## 2013-01-25 LAB — VITAMIN D 25 HYDROXY (VIT D DEFICIENCY, FRACTURES): Vit D, 25-Hydroxy: 27 ng/mL — ABNORMAL LOW (ref 30–89)

## 2013-01-31 ENCOUNTER — Other Ambulatory Visit (HOSPITAL_COMMUNITY)
Admission: RE | Admit: 2013-01-31 | Discharge: 2013-01-31 | Disposition: A | Discharge status: Home / Self Care | Payer: Medicare Other | Source: Ambulatory Visit | Attending: Family Medicine | Admitting: Family Medicine

## 2013-01-31 ENCOUNTER — Ambulatory Visit (INDEPENDENT_AMBULATORY_CARE_PROVIDER_SITE_OTHER): Payer: Medicare Other | Admitting: Family Medicine

## 2013-01-31 ENCOUNTER — Encounter: Payer: Self-pay | Admitting: Family Medicine

## 2013-01-31 VITALS — BP 124/72 | HR 77 | Temp 98.0°F | Ht 61.5 in | Wt 174.2 lb

## 2013-01-31 DIAGNOSIS — S8012XS Contusion of left lower leg, sequela: Secondary | ICD-10-CM

## 2013-01-31 DIAGNOSIS — Z0000 Encounter for general adult medical examination without abnormal findings: Secondary | ICD-10-CM

## 2013-01-31 DIAGNOSIS — R300 Dysuria: Secondary | ICD-10-CM

## 2013-01-31 DIAGNOSIS — E559 Vitamin D deficiency, unspecified: Secondary | ICD-10-CM

## 2013-01-31 DIAGNOSIS — R7309 Other abnormal glucose: Secondary | ICD-10-CM

## 2013-01-31 DIAGNOSIS — F17200 Nicotine dependence, unspecified, uncomplicated: Secondary | ICD-10-CM

## 2013-01-31 DIAGNOSIS — E7800 Pure hypercholesterolemia, unspecified: Secondary | ICD-10-CM

## 2013-01-31 DIAGNOSIS — Z23 Encounter for immunization: Secondary | ICD-10-CM

## 2013-01-31 DIAGNOSIS — J019 Acute sinusitis, unspecified: Secondary | ICD-10-CM | POA: Insufficient documentation

## 2013-01-31 DIAGNOSIS — Z124 Encounter for screening for malignant neoplasm of cervix: Secondary | ICD-10-CM | POA: Insufficient documentation

## 2013-01-31 DIAGNOSIS — Z1151 Encounter for screening for human papillomavirus (HPV): Secondary | ICD-10-CM | POA: Insufficient documentation

## 2013-01-31 DIAGNOSIS — Z01419 Encounter for gynecological examination (general) (routine) without abnormal findings: Secondary | ICD-10-CM | POA: Insufficient documentation

## 2013-01-31 LAB — POCT URINALYSIS DIPSTICK
Bilirubin, UA: NEGATIVE
Blood, UA: NEGATIVE
Glucose, UA: NEGATIVE
Ketones, UA: NEGATIVE
Leukocytes, UA: NEGATIVE
Nitrite, UA: POSITIVE
Protein, UA: NEGATIVE
Spec Grav, UA: 1.025
Urobilinogen, UA: 0.2
pH, UA: 6

## 2013-01-31 NOTE — Patient Instructions (Signed)
Flu vaccine today Tdap (tetanus) vacine today Keep working on weight loss Think about quitting smoking Add 2000 iu of vitamin D over the counter daily  Leave a urine specimen on the way out  Here is a px for amoxicillin - for sinus infection  Keep using nasal saline  When you get your new insurance- let me know so we can get you back on crestor and refill medicines  Here is a packet on planning an advanced directive

## 2013-01-31 NOTE — Progress Notes (Signed)
Subjective:    Patient ID: Karina [name], female    DOB: Oct 15, 1955, 57 y.o.   MRN: [id number]  HPI I have personally reviewed the Medicare Annual Wellness questionnaire and have noted 1. The patient's medical and social history 2. Their use of alcohol, tobacco or illicit drugs 3. Their current medications and supplements 4. The patient's functional ability including ADL's, fall risks, home safety risks and hearing or visual             impairment. 5. Diet and physical activities 6. Evidence for depression or mood disorders  The patients weight, height, BMI have been recorded in the chart and visual acuity is per eye clinic.  I have made referrals, counseling and provided education to the patient based review of the above and I have provided the pt with a written personalized care plan for preventive services.  Has been feeling fair  She still has a lot of aching - from injury in July  This bothers her  Cannot afford any medicines right now  By Jan first will have some px coverage   She knows she has uti and sinus infection today  Sinuses hurt Ears really hurt   Burns to urinate, darker urine  And frequency and foul odor   Not currently on any meds for her MS- has been doing fairly well with that   See scanned forms.  Routine anticipatory guidance given to patient.  See health maintenance. Flu- will get a flu shot today Shingles- cannot afford  PNA 5/05 - she wants to hold off on that  Tetanus- is due for that also - will do that  Colonosc 6/12  Breast cancer screening 6/12 mammogram - she will make her own appt at breast center  Self exam -no lumps  Advance directive-does not have a living will  Cognitive function addressed- see scanned forms- and if abnormal then additional documentation follows. Does note memory problems/ short term with her MS ,occ word finding - about the same as it was (is on gabapentin)   PMH and SH reviewed  Meds, vitals, and allergies  reviewed.   ROS: See HPI.  Otherwise negative.    Hyperglycemia  Lab Results  Component Value Date   HGBA1C 5.9 01/24/2013   she has worked on loosing weight -- thinks 15 lb in the past year  Stress after her dad died a year ago Snacks too much at night   D level 27 - not taking any D  Smoking about the same Needs to quit- but not ready  Patches have not worked well  However stress is getting better - still in grief     Chemistry      Component Value Date/Time   NA 138 01/24/2013 0837   K 4.7 01/24/2013 0837   CL 105 01/24/2013 0837   CO2 27 01/24/2013 0837   BUN 12 01/24/2013 0837   CREATININE 1.1 01/24/2013 0837      Component Value Date/Time   CALCIUM 9.5 01/24/2013 0837   ALKPHOS 76 01/24/2013 0837   AST 21 01/24/2013 0837   ALT 18 01/24/2013 0837   BILITOT 0.6 01/24/2013 0837      Hyperlipidemia Lab Results  Component Value Date   CHOL 276* 01/24/2013   CHOL 159 11/19/2011   CHOL 145 06/04/2010   Lab Results  Component Value Date   HDL 57.90 01/24/2013   HDL 67 11/19/2011   HDL 62.00 06/04/2010   Lab Results  Component Value Date  LDLCALC 77 11/19/2011   LDLCALC 73 06/04/2010   LDLCALC 80 10/13/2009   Lab Results  Component Value Date   TRIG 107.0 01/24/2013   TRIG 76 11/19/2011   TRIG 49.0 06/04/2010   Lab Results  Component Value Date   CHOLHDL 5 01/24/2013   CHOLHDL 2.4 11/19/2011   CHOLHDL 2 06/04/2010   Lab Results  Component Value Date   LDLDIRECT 206.5 01/24/2013   LDLDIRECT 176.9 04/20/2010   the "only thing" she tolerates is crestor and she cannot afford it   Patient Active Problem List   Diagnosis Date Noted  . Encounter for routine gynecological examination 01/31/2013  . Acute sinusitis 01/31/2013  . Dysuria 01/31/2013  . Encounter for Medicare annual wellness exam 01/23/2013  . Contusion of lower leg, left 10/04/2012  . Chest pain 09/20/2012  . SOB (shortness of breath) 09/20/2012  . Dyspnea 09/13/2012  . Asthmatic bronchitis ,  chronic 12/06/2011  . Chronic cough 11/22/2011  . OTHER DISORDERS OF EYELID 04/02/2010  . ANXIETY, SITUATIONAL 07/17/2009  . BACK PAIN, LUMBAR, WITH RADICULOPATHY 12/27/2008  . HEMORRHOIDS-EXTERNAL 11/07/2008  . IRRITABLE BOWEL SYNDROME 11/07/2008  . LUNG NODULE 10/14/2008  . HYPERGLYCEMIA, BORDERLINE 10/14/2008  . PERSONAL HX COLONIC POLYPS 10/08/2008  . UNSPECIFIED VITAMIN D DEFICIENCY 07/23/2008  . SYNCOPE, HX OF 06/01/2008  . FOOT SURGERY, HX OF 06/01/2008  . DILATION AND CURETTAGE, HX OF 06/01/2008  . OSTEOARTHRITIS, SHOULDER, RIGHT 03/22/2008  . ROTATOR CUFF SYNDROME, RIGHT 03/22/2008  . ARTHRALGIA 12/12/2007  . ALLERGIC RHINITIS 08/01/2007  . Smoker 10/17/2006  . NEUROPATHY-PERIPHERAL 10/17/2006  . FOOT PAIN, BILATERAL 10/17/2006  . SYMPTOM, EDEMA 10/03/2006  . HYPOGLYCEMIA 09/30/2006  . HYPERCHOLESTEROLEMIA 09/30/2006  . MULTIPLE SCLEROSIS 09/30/2006  . GERD 09/30/2006  . FIBROCYSTIC BREAST DISEASE 09/30/2006  . SEIZURE DISORDER 09/30/2006  . DIZZINESS OR VERTIGO 09/30/2006  . URINARY INCONTINENCE 09/30/2006   Past Medical History  Diagnosis Date  . Vertigo   . GERD (gastroesophageal reflux disease)   . Seizure disorder   . Urinary incontinence   . Plantar fasciitis   . Colon polyps 08/2008    Adenomatous  . Gastritis 08/2008    H. pylori  . Lung nodule   . Menopausal disorder   . Anxiety     Councelor- Evalina Field  . Multiple sclerosis   . Allergic rhinitis   . Hypercholesteremia   . Diabetes mellitus    Past Surgical History  Procedure Laterality Date  . Cervical discectomy  2004    x 2, Anterior; Fusion C4-5, C5-6, C6-7, Dr. Donalee Citrin  . Tubal ligation    . Dilation and curettage of uterus    . Tonsillectomy    . Foot surgery      left  . Cholecystectomy    . Chest ct  11/2008    With small 4 mm nodule L lung base (rec re check in 1 year)  . Chest ct  07/2009    Re check chest CT - lung nodule stable  . Ct sinus ltd w/o cm  02/2009     negative  . Colonoscopy  08/2008    Polyps/ re check 5 yrs  . Esophagogastroduodenoscopy  08/2008    Erosive gastritis, h pylori (treated)  . Rotator cuff repair      right   History  Substance Use Topics  . Smoking status: Current Every Day Smoker -- 0.20 packs/day for 30 years    Types: Cigarettes  . Smokeless tobacco: Never Used  Comment: 2-3 cigarettes a day  . Alcohol Use: No   Family History  Problem Relation Age of Onset  . Migraines Mother   . Heart attack Mother   . Coronary artery disease Mother   . Coronary artery disease Father     6 bypasses   . Colon cancer Maternal Grandfather 80  . Coronary artery disease Cousin   . Coronary artery disease Paternal Grandmother   . Colon cancer Paternal Aunt 62  . Lung cancer Paternal Aunt    Allergies  Allergen Reactions  . Percocet [Oxycodone-Acetaminophen] Shortness Of Breath    Felt like going to pass out and sweating  . Tecfidera [Dimethyl Fumarate] Shortness Of Breath, Palpitations, Rash and Cough  . Amitriptyline Hcl     REACTION: swelling and itching  . Atorvastatin     REACTION: elevated LFT's  . Clarithromycin     REACTION: reaction not known  . Duloxetine     REACTION: pain  . Levofloxacin     REACTION: Rash  . Pregabalin     REACTION: LE swelling  . Pseudoephedrine     REACTION: legs hurt  . Zoloft [Sertraline Hcl] Other (See Comments)    Headaches    Current Outpatient Prescriptions on File Prior to Visit  Medication Sig Dispense Refill  . gabapentin (NEURONTIN) 300 MG capsule Take 2 capsules (600 mg total) by mouth at bedtime.  180 capsule  2  . ALPRAZolam (XANAX) 0.5 MG tablet Take 0.5 mg by mouth 2 (two) times daily as needed. For anxiety.      . budesonide-formoterol (SYMBICORT) 160-4.5 MCG/ACT inhaler Inhale 2 puffs into the lungs 2 (two) times daily.      . furosemide (LASIX) 20 MG tablet Take 20 mg by mouth daily.      Marland Kitchen guaiFENesin (MUCINEX) 600 MG 12 hr tablet Take 1,200 mg by mouth as  needed.       Marland Kitchen omeprazole (PRILOSEC) 40 MG capsule Take 1 capsule (40 mg total) by mouth 2 (two) times daily.  60 capsule  11  . VENTOLIN HFA 108 (90 BASE) MCG/ACT inhaler INHALE 2 PUFFS UP TO EVERY 4 HOURS AS NEEDED FOR WHEEZING  1 Inhaler  11   No current facility-administered medications on file prior to visit.    Review of Systems    Review of Systems  Constitutional: Negative for fever, appetite change, fatigue and unexpected weight change.  ENT pos for congestion/ sinus pressure  Eyes: Negative for pain and visual disturbance.  Respiratory: Negative for wheeze  and shortness of breath.   Cardiovascular: Negative for cp or palpitations    Gastrointestinal: Negative for nausea, diarrhea and constipation.  Genitourinary: pos for urgency and frequency.  MSK pos fo aches and pains Skin: Negative for pallor or rash   Neurological: Negative for weakness, light-headedness, numbness and headaches.  Hematological: Negative for adenopathy. Does not bruise/bleed easily.  Psychiatric/Behavioral: Negative for dysphoric mood. The patient is nervous/anxious.      Objective:   Physical Exam  Constitutional: She appears well-developed and well-nourished. No distress.  HENT:  Head: Normocephalic and atraumatic.  Right Ear: External ear normal.  Left Ear: External ear normal.  Mouth/Throat: Oropharynx is clear and moist.  Nares are injected and congested  No sinus tenderness noted   Eyes: Conjunctivae and EOM are normal. Pupils are equal, round, and reactive to light. Right eye exhibits no discharge. Left eye exhibits no discharge. No scleral icterus.  Neck: Normal range of motion. Neck supple. No JVD  present. No thyromegaly present.  Cardiovascular: Normal rate, regular rhythm, normal heart sounds and intact distal pulses.  Exam reveals no gallop.   Pulmonary/Chest: Effort normal and breath sounds normal. No respiratory distress. She has no wheezes. She has no rales.  Abdominal: Soft. Bowel  sounds are normal. She exhibits no distension and no mass. There is no tenderness.  Genitourinary:  o External genitalia nl appearing  o Urethral meatus nl appearing  o Urethra  Normally mobile and nt o Bladder nontender and non distended  o Vagina nl without abn discharge and nl mucosa o Cervix  No lesions or friability o Uterus  Not enlarged or fixed  o Adnexa/parametria no mass/ tenderness or fullness noted  o Anus and perineum normal appearing/ no hemorrhoids noted  Breast exam: No mass, nodules, thickening, tenderness, bulging, retraction, inflamation, nipple discharge or skin changes noted.  No axillary or clavicular LA.  Chaperoned exam.     Musculoskeletal: She exhibits no edema and no tenderness.  Lymphadenopathy:    She has no cervical adenopathy.  Neurological: She is alert. She has normal reflexes. No cranial nerve deficit. She exhibits normal muscle tone. Coordination normal.  Skin: Skin is warm and dry. No rash noted. No erythema. No pallor.  Mild faded ecchymosis and hyperpigmentation over area of prior injury to L lower leg laterally Minimally tender  Psychiatric: She has a normal mood and affect.          Assessment & Plan:

## 2013-01-31 NOTE — Progress Notes (Signed)
Pre-visit discussion using our clinic review tool. No additional management support is needed unless otherwise documented below in the visit note.  

## 2013-02-01 LAB — POCT UA - MICROSCOPIC ONLY
Casts, Ur, LPF, POC: 0
Yeast, UA: 0

## 2013-02-01 NOTE — Assessment & Plan Note (Signed)
Left lower leg Ecchymosis has faded/ some hyperpigmentation No fx on prev xray  Is improving slowly

## 2013-02-01 NOTE — Assessment & Plan Note (Signed)
Pt given px for amoxicillin-she will fill when she can afford to  No abx sample in the office Disc symptomatic care - see instructions on AVS  Update if not starting to improve in a week or if worsening

## 2013-02-01 NOTE — Assessment & Plan Note (Signed)
Lab Results  Component Value Date   HGBA1C 5.9 01/24/2013   Stable  Disc diet- doing better with that

## 2013-02-01 NOTE — Assessment & Plan Note (Signed)
Reviewed recommendation to raise D level for bone and overall health

## 2013-02-01 NOTE — Assessment & Plan Note (Signed)
Disc in detail risks of smoking and possible outcomes including copd, vascular/ heart disease, cancer , respiratory and sinus infections  Pt voices understanding Pt states she is not ready to quit  

## 2013-02-01 NOTE — Assessment & Plan Note (Signed)
Disc goals for lipids and reasons to control them Rev labs with pt Rev low sat fat diet in detail  Will plan to get back on crestor (the only statin she tolerates) when she gets insurance in Jan  She will alert [place] when to call in px then re check chol

## 2013-02-01 NOTE — Assessment & Plan Note (Signed)
Exam and pap done today Pt will schedule her own mammogram when she can afford one

## 2013-02-01 NOTE — Assessment & Plan Note (Signed)
ua pos for nitrite  Enc to inc water  Pt cannot afford any px right now - has px for amox for sinusitis  Can try that and re check urine for improvement

## 2013-02-01 NOTE — Assessment & Plan Note (Signed)
Reviewed health habits including diet and exercise and skin cancer prevention Reviewed appropriate screening tests for age  Also reviewed health mt list, fam hx and immunization status , as well as social and family history   See HPI Labs reviewed  

## 2013-02-07 ENCOUNTER — Encounter: Payer: Self-pay | Admitting: Family Medicine

## 2013-02-07 MED ORDER — AMOXICILLIN 500 MG PO CAPS: 500.0000 mg | ORAL_CAPSULE | Freq: Three times a day (TID) | ORAL | Status: DC

## 2013-02-07 NOTE — Addendum Note (Signed)
Addended by: Roxy Manns A on: 02/07/2013 07:27 PM   Modules accepted: Orders

## 2013-02-07 NOTE — Telephone Encounter (Signed)
amox sent to pharmacy

## 2013-03-14 ENCOUNTER — Other Ambulatory Visit: Payer: Self-pay | Admitting: Family Medicine

## 2013-03-15 NOTE — Telephone Encounter (Signed)
I phoned the patient since this medication was not on her current meds list and she said that she has her insurance card now so she is able to purchase the medication.  I sent the Rx but wanted to let the PCP know in case we need to schedule labs since she has apparently not been on the med in a while.  Please advise.

## 2013-04-11 ENCOUNTER — Other Ambulatory Visit: Payer: Self-pay | Admitting: Neurology

## 2013-04-11 NOTE — Telephone Encounter (Signed)
Former [facility] Patient, has not been assigned a new MD.  Auth 1 refill via [place].  I called the patient and asked that she contact our office for an appt.

## 2013-05-09 ENCOUNTER — Ambulatory Visit (INDEPENDENT_AMBULATORY_CARE_PROVIDER_SITE_OTHER): Payer: Medicare HMO | Admitting: Neurology

## 2013-05-09 ENCOUNTER — Encounter: Payer: Self-pay | Admitting: Neurology

## 2013-05-09 VITALS — BP 111/77 | HR 75 | Wt 186.0 lb

## 2013-05-09 DIAGNOSIS — G35 Multiple sclerosis: Secondary | ICD-10-CM

## 2013-05-09 NOTE — Progress Notes (Signed)
Reason for visit: Multiple sclerosis  Karina [name] is an 58 y.o. female  History of present illness:  Karina [name] is a 58 year old right-handed white female with a history of multiple sclerosis initially diagnosed in 21. The patient indicated that the diagnosis came to light following several syncopal episodes. The patient underwent a MRI of the brain, and she was found to have lesions consistent with MS. The patient indicates that she had a lumbar puncture, and the diagnosis of multiple sclerosis was confirmed. The patient has been on Betaseron for number of years, but she developed calcifications at the sites of injection. The patient was given a trial on Tecfidera, but she could not tolerate this. The patient has been on no medications for her multiple sclerosis in over 2 years mainly secondary to problems with her insurance, and inability to afford medications even with patient assistance. The patient has had some recent issues with depression, and she has become more withdrawn. The patient last had MRI evaluation of the brain on 07/21/2011, and no enhancing lesions were noted at that time. The patient has not had any new numbness or weakness of the extremities, vision changes, but she does have some ongoing issues with urinary incontinence. The patient denies any significant balance issues or problems with falling. The patient comes to this office for an evaluation.  Past Medical History  Diagnosis Date  . Vertigo   . GERD (gastroesophageal reflux disease)   . Seizure disorder     single seizure/hypoglycemia  . Urinary incontinence   . Plantar fasciitis   . Colon polyps 08/2008    Adenomatous  . Gastritis 08/2008    H. pylori  . Lung nodule   . Menopausal disorder   . Anxiety     Councelor- Karina [name]  . Multiple sclerosis   . Allergic rhinitis   . Hypercholesteremia   . Diabetes mellitus   . Depression   . COPD (chronic obstructive pulmonary disease)   . History of  shingles     Left V1    Past Surgical History  Procedure Laterality Date  . Cervical discectomy  2004    x 2, Anterior; Fusion C4-5, C5-6, C6-7, Dr. Kary Kos  . Tubal ligation    . Dilation and curettage of uterus    . Tonsillectomy    . Foot surgery      left plantar fascial problem  . Cholecystectomy    . Chest ct  11/2008    With small 4 mm nodule L lung base (rec re check in 1 year)  . Chest ct  07/2009    Re check chest CT - lung nodule stable  . Ct sinus ltd w/o cm  02/2009    negative  . Colonoscopy  08/2008    Polyps/ re check 5 yrs  . Esophagogastroduodenoscopy  08/2008    Erosive gastritis, h pylori (treated)  . Rotator cuff repair      right    Family History  Problem Relation Age of Onset  . Migraines Mother   . Heart attack Mother   . Coronary artery disease Mother   . Coronary artery disease Father     6 bypasses   . Stroke Father   . Diabetes Father   . Renal Disease Father   . Colon cancer Maternal Grandfather 56  . Coronary artery disease Cousin   . Coronary artery disease Paternal Grandmother   . Colon cancer Paternal Aunt 39  . Lung cancer Paternal  Aunt   . Multiple sclerosis Neg Hx     Social history:  reports that she has been smoking Cigarettes.  She has a 6 pack-year smoking history. She has never used smokeless tobacco. She reports that she does not drink alcohol or use illicit drugs.    Allergies  Allergen Reactions  . Percocet [Oxycodone-Acetaminophen] Shortness Of Breath    Felt like going to pass out and sweating  . Tecfidera [Dimethyl Fumarate] Shortness Of Breath, Palpitations, Rash and Cough  . Amitriptyline Hcl     REACTION: swelling and itching  . Atorvastatin     REACTION: elevated LFT's  . Clarithromycin     REACTION: reaction not known  . Duloxetine     REACTION: pain  . Levofloxacin     REACTION: Rash  . Pregabalin     REACTION: LE swelling  . Pseudoephedrine     REACTION: legs hurt  . Zoloft [Sertraline Hcl]  Other (See Comments)    Headaches     Medications:  Current Outpatient Prescriptions on File Prior to Visit  Medication Sig Dispense Refill  . gabapentin (NEURONTIN) 300 MG capsule TAKE 2 CAPSULES (600 MG TOTAL) BY MOUTH AT BEDTIME.  60 capsule  0  . ALPRAZolam (XANAX) 0.5 MG tablet Take 0.5 mg by mouth 2 (two) times daily as needed. For anxiety.      . budesonide-formoterol (SYMBICORT) 160-4.5 MCG/ACT inhaler Inhale 2 puffs into the lungs 2 (two) times daily.      . CRESTOR 20 MG tablet TAKE 1/2 TABLET BY MOUTH DAILY  15 tablet  2  . furosemide (LASIX) 20 MG tablet Take 20 mg by mouth daily.      Marland Kitchen guaiFENesin (MUCINEX) 600 MG 12 hr tablet Take 1,200 mg by mouth as needed.       Marland Kitchen omeprazole (PRILOSEC) 40 MG capsule Take 1 capsule (40 mg total) by mouth 2 (two) times daily.  60 capsule  11  . VENTOLIN HFA 108 (90 BASE) MCG/ACT inhaler INHALE 2 PUFFS UP TO EVERY 4 HOURS AS NEEDED FOR WHEEZING  1 Inhaler  11   No current facility-administered medications on file prior to visit.    ROS:  Out of a complete 14 system review of symptoms, the patient complains only of the following symptoms, and all other reviewed systems are negative.  Fatigue Ear discharge Difficulty swallowing on occasion Cough, wheezing, shortness of breath, choking Eye discharge, eye itching Rectal pain Restless legs, insomnia Joint pain, joint swelling, back pain Frequent infections Memory loss, dizziness, tremors Agitation, behavior problems, depression, anxiety  Blood pressure 111/77, pulse 75, weight 186 lb (84.369 kg).  Physical Exam  General: The patient is alert and cooperative at the time of the examination. The patient is moderately obese.  Skin: No significant peripheral edema is noted.   Neurologic Exam  Mental status: The patient is oriented x 3.  Cranial nerves: Facial symmetry is present. Speech is normal, no aphasia or dysarthria is noted. Extraocular movements are full. Visual fields  are full. Pupils are equal, round, and reactive to light. Discs are flat bilaterally.  Motor: The patient has good strength in all 4 extremities.  Sensory examination: Soft touch sensation is decreased on the left hand and foot, symmetric on the face.  Coordination: The patient has good finger-nose-finger and heel-to-shin bilaterally.  Gait and station: The patient has a normal gait. Tandem gait is normal. Romberg is negative. No drift is seen.  Reflexes: Deep tendon reflexes are symmetric.  MRI brain 07/21/2011:  Impression: Abnormal MRI brain (with and without contrast) demonstrating: 1. Multiple round and ovoid, periventricular, subcortical and juxtacortical chronic demyelinating plaques.  2. No acute plaques are seen.   Assessment/Plan:  1. Multiple sclerosis  2. Depression and anxiety  The patient is currently not on any disease modifying agents. I have discussed the possibility of going on Gilenya, but the patient is not clear that she wants to do this at this time. The patient has significant financial issues associated with an inability to afford  her medications. The patient has gone off of most of her medications because of inability to afford her drugs. The patient will followup through this office in one year. The patient will contact me if she wishes to consider the Gilenya.The multiple sclerosis appears to be relatively benign, with minimal progression over time.  Jill Alexanders MD 05/09/2013 8:54 PM  [facility] [address] [place], [name] [address]  Phone [phone] Fax [phone]

## 2013-05-09 NOTE — Patient Instructions (Signed)
Multiple Sclerosis  Multiple sclerosis (MS) is a disease of the central nervous system. It leads to loss of the insulating covering of the nerves (myelin sheath) of your brain. When this happens, brain signals do not get transmitted properly or may not get transmitted at all. The symptoms of MS occur in episodes or attacks. These attacks may last weeks to months. There may be long periods of nearly no problems between attacks. The age of onset of MS varies.   CAUSES  The cause of MS is unknown. However, it is more common in the [place] than in the southern [place].  RISK FACTORS  There is a higher incidence of MS in women than in men. MS is not an inherited illness, although your risk of MS is higher if you have a relative with MS.  SIGNS AND SYMPTOMS   The symptoms of MS occur in episodes or attacks. These attacks may last weeks to months. There may be long periods of almost no symptoms between attacks.  The symptoms of MS vary. This is because of the many different ways it affects the central nervous system. The main symptoms of MS include:   Vision problems and eye pain.   Numbness.   Weakness.   Paralysis in your arms, hands, feet, and legs (extremities).   Balance problems.   Tremors.  DIAGNOSIS   Your health care provider can diagnose MS with the help of imaging exams and lab tests. These may include specialized X-ray exams and spinal fluid tests. The best imaging exam to confirm a diagnosis of MS is MRI.  TREATMENT   There is no known cure for MS, but there are medicines that can decrease the number and frequency of attacks. Steroids are often used for short-term relief. Physical and occupational therapy may also help.  HOME CARE INSTRUCTIONS    Take medicines as directed by your health care provider.   Exercise as directed by your health care provider.  SEEK MEDICAL CARE IF:  You begin to feel depressed.  SEEK IMMEDIATE MEDICAL CARE IF:   You develop paralysis.   You develop  problems with bladder, bowel, or sexual function.   You develop mental changes, such as forgetfulness or mood swings.   You have a seizure.  Document Released: 02/20/2000 Document Revised: 12/13/2012 Document Reviewed: 10/30/2012  [employer] Patient Information 2014 [employer]

## 2013-05-11 ENCOUNTER — Other Ambulatory Visit: Payer: Self-pay

## 2013-05-11 ENCOUNTER — Other Ambulatory Visit: Payer: Self-pay | Admitting: Neurology

## 2013-05-11 DIAGNOSIS — Z1231 Encounter for screening mammogram for malignant neoplasm of breast: Secondary | ICD-10-CM

## 2013-05-28 ENCOUNTER — Encounter: Payer: Self-pay | Admitting: Family Medicine

## 2013-05-28 ENCOUNTER — Ambulatory Visit
Admission: RE | Admit: 2013-05-28 | Discharge: 2013-05-28 | Disposition: A | Discharge status: Home / Self Care | Payer: Medicare HMO | Source: Ambulatory Visit

## 2013-05-28 ENCOUNTER — Ambulatory Visit (INDEPENDENT_AMBULATORY_CARE_PROVIDER_SITE_OTHER): Payer: Medicare HMO | Admitting: Family Medicine

## 2013-05-28 VITALS — BP 126/87 | HR 86 | Temp 98.7°F | Ht 61.5 in | Wt 185.0 lb

## 2013-05-28 DIAGNOSIS — R42 Dizziness and giddiness: Secondary | ICD-10-CM

## 2013-05-28 DIAGNOSIS — Z1231 Encounter for screening mammogram for malignant neoplasm of breast: Secondary | ICD-10-CM

## 2013-05-28 DIAGNOSIS — R2990 Unspecified symptoms and signs involving the nervous system: Secondary | ICD-10-CM

## 2013-05-28 DIAGNOSIS — R269 Unspecified abnormalities of gait and mobility: Secondary | ICD-10-CM

## 2013-05-28 DIAGNOSIS — R29818 Other symptoms and signs involving the nervous system: Secondary | ICD-10-CM

## 2013-05-28 DIAGNOSIS — R300 Dysuria: Secondary | ICD-10-CM

## 2013-05-28 LAB — POCT URINALYSIS DIPSTICK
Bilirubin, UA: NEGATIVE
Blood, UA: NEGATIVE
Glucose, UA: NEGATIVE
Ketones, UA: NEGATIVE
Leukocytes, UA: NEGATIVE
Nitrite, UA: NEGATIVE
Protein, UA: NEGATIVE
Spec Grav, UA: 1.015
Urobilinogen, UA: 0.2
pH, UA: 5

## 2013-05-28 MED ORDER — CIPROFLOXACIN HCL 500 MG PO TABS: 500.0000 mg | ORAL_TABLET | Freq: Two times a day (BID) | ORAL | Status: DC

## 2013-05-28 NOTE — Patient Instructions (Signed)
REFERRALS TO SPECIALISTS, SPECIAL TESTS (MRI, CT, ULTRASOUNDS)  GO THE WAITING ROOM AND TELL CHECK IN YOU NEED HELP WITH A REFERRAL. Either MARION or LINDA will help you set it up.  If it is between 1-2 PM they may be at lunch.  After 5 PM, they will likely be at home.  They will call you, so please make sure the office has your correct phone number.  Referrals sometimes can be done same day if urgent, but others can take 2 or 3 days to get an appointment. Starting in 2015, many of the new Medicare insurance plans and Affordable Care Act (Obamacare) Health plans offered take much longer for referrals. They have added additional paperwork and steps.  MRI's and CT's can take up to a week for the test. (Emergencies like strokes take precedence. I will tell you if you have an emergency.)   If your referral is to an in-network [place] office, their office may contact you directly prior to our office reaching you.  -- Examples: [facility], [facility], [facility], [facility], [facility], and many more.  Specialist appointment times vary a great deal, mostly on the specialist's schedule and if they have openings. -- Our office tries to get you in as fast as possible. -- Some specialists have very long wait times. (Example. Dermatology. Usually months) -- If you have a true emergency like new cancer, we work to get you in ASAP.   

## 2013-05-28 NOTE — Progress Notes (Signed)
Date:  05/28/2013   Name:  AUTYM SIESS   DOB:  09-06-1955   MRN:  [id number]  Primary Physician:  Loura Pardon, MD   Chief Complaint: Dysuria, Dizziness and Acid taste in mouth   Subjective:   History of Present Illness:  Karina [name] is a 58 y.o. pleasant patient who presents with the following:  Patient of Dr. Marliss Coots with multiple medical problems including MS, neuropathy, anxiety, seizure disorder, tobacco abuse, and asthma who presents as an acute work-in with multiple complaints.   1. Dizzy for two weeks, dizzy all day long, dizzy with getting up. Dizzy with getting up. Feels nothing like vertigo that she has had before, "just dizzy." And not right.  Her balance has also been off and she has not left the house for 2 weeks.  No palpitations, no history of tachycardia or bradycardia.   BP Readings from Last 3 Encounters:  05/28/13 128/89  05/09/13 111/77  01/31/13 124/72   Filed Vitals:   05/28/13 1556 05/28/13 1652 05/28/13 1653 05/28/13 1654  BP: 128/89 115/87 144/90 126/87  Pulse: 79 76 75 86  Temp: 98.7 F (37.1 C)     TempSrc: Oral     Height: 5' 1.5" (1.562 m)     Weight: 185 lb (83.915 kg)       She is not orthostatic  H/o HLD, tobacco, h/o multiple CVA with her father. H/o MS. No blurred vision, no altered consciousness, no change in mentation or mood.  2. UTI symptoms? amox in 02/2013. Combined UTI and sinusitis - "never really got over it" Multiple abx allergies  Balance is off. Not walking straight line well.  3. GERD sx, not a new problem.  Patient Active Problem List   Diagnosis Date Noted  . Encounter for routine gynecological examination 01/31/2013  . Acute sinusitis 01/31/2013  . Dysuria 01/31/2013  . Encounter for Medicare annual wellness exam 01/23/2013  . Contusion of lower leg, left 10/04/2012  . Chest pain 09/20/2012  . SOB (shortness of breath) 09/20/2012  . Dyspnea 09/13/2012  . Asthmatic bronchitis , chronic 12/06/2011    . Chronic cough 11/22/2011  . OTHER DISORDERS OF EYELID 04/02/2010  . ANXIETY, SITUATIONAL 07/17/2009  . BACK PAIN, LUMBAR, WITH RADICULOPATHY 12/27/2008  . HEMORRHOIDS-EXTERNAL 11/07/2008  . IRRITABLE BOWEL SYNDROME 11/07/2008  . LUNG NODULE 10/14/2008  . HYPERGLYCEMIA, BORDERLINE 10/14/2008  . PERSONAL HX COLONIC POLYPS 10/08/2008  . UNSPECIFIED VITAMIN D DEFICIENCY 07/23/2008  . SYNCOPE, HX OF 06/01/2008  . FOOT SURGERY, HX OF 06/01/2008  . DILATION AND CURETTAGE, HX OF 06/01/2008  . OSTEOARTHRITIS, SHOULDER, RIGHT 03/22/2008  . ROTATOR CUFF SYNDROME, RIGHT 03/22/2008  . ARTHRALGIA 12/12/2007  . ALLERGIC RHINITIS 08/01/2007  . Smoker 10/17/2006  . NEUROPATHY-PERIPHERAL 10/17/2006  . FOOT PAIN, BILATERAL 10/17/2006  . SYMPTOM, EDEMA 10/03/2006  . HYPOGLYCEMIA 09/30/2006  . HYPERCHOLESTEROLEMIA 09/30/2006  . MULTIPLE SCLEROSIS 09/30/2006  . GERD 09/30/2006  . FIBROCYSTIC BREAST DISEASE 09/30/2006  . SEIZURE DISORDER 09/30/2006  . DIZZINESS OR VERTIGO 09/30/2006  . URINARY INCONTINENCE 09/30/2006    Past Medical History  Diagnosis Date  . Vertigo   . GERD (gastroesophageal reflux disease)   . Seizure disorder     single seizure/hypoglycemia  . Urinary incontinence   . Plantar fasciitis   . Colon polyps 08/2008    Adenomatous  . Gastritis 08/2008    H. pylori  . Lung nodule   . Menopausal disorder   . Anxiety     Councelor- Opal Sidles  [place]  . Multiple sclerosis   . Allergic rhinitis   . Hypercholesteremia   . Diabetes mellitus   . Depression   . COPD (chronic obstructive pulmonary disease)   . History of shingles     Left V1    Past Surgical History  Procedure Laterality Date  . Cervical discectomy  2004    x 2, Anterior; Fusion C4-5, C5-6, C6-7, Dr. Kary Kos  . Tubal ligation    . Dilation and curettage of uterus    . Tonsillectomy    . Foot surgery      left plantar fascial problem  . Cholecystectomy    . Chest ct  11/2008    With small 4 mm nodule  L lung base (rec re check in 1 year)  . Chest ct  07/2009    Re check chest CT - lung nodule stable  . Ct sinus ltd w/o cm  02/2009    negative  . Colonoscopy  08/2008    Polyps/ re check 5 yrs  . Esophagogastroduodenoscopy  08/2008    Erosive gastritis, h pylori (treated)  . Rotator cuff repair      right    History   Social History  . Marital Status: Married    Spouse Name: N/A    Number of Children: 3  . Years of Education: N/A   Occupational History  . Disabled     Social History Main Topics  . Smoking status: Current Every Day Smoker -- 0.20 packs/day for 30 years    Types: Cigarettes  . Smokeless tobacco: Never Used     Comment: 2-3 cigarettes a day  . Alcohol Use: No  . Drug Use: No  . Sexual Activity: Not on file   Other Topics Concern  . Not on file   Social History Narrative   Married with 3 children   Daily caffeine use - 2     Family History  Problem Relation Age of Onset  . Migraines Mother   . Heart attack Mother   . Coronary artery disease Mother   . Coronary artery disease Father     6 bypasses   . Stroke Father   . Diabetes Father   . Renal Disease Father   . Colon cancer Maternal Grandfather 51  . Coronary artery disease Cousin   . Coronary artery disease Paternal Grandmother   . Colon cancer Paternal Aunt 22  . Lung cancer Paternal Aunt   . Multiple sclerosis Neg Hx     Allergies  Allergen Reactions  . Percocet [Oxycodone-Acetaminophen] Shortness Of Breath    Felt like going to pass out and sweating  . Tecfidera [Dimethyl Fumarate] Shortness Of Breath, Palpitations, Rash and Cough  . Amitriptyline Hcl     REACTION: swelling and itching  . Atorvastatin     REACTION: elevated LFT's  . Clarithromycin     REACTION: reaction not known  . Duloxetine     REACTION: pain  . Levofloxacin     REACTION: Rash  . Pregabalin     REACTION: LE swelling  . Pseudoephedrine     REACTION: legs hurt  . Zoloft [Sertraline Hcl] Other (See  Comments)    Headaches     Medication list has been reviewed and updated.  Review of Systems:   GEN: No acute illnesses, no fevers, chills. GI: No n/v/d, eating normally Pulm: No SOB Neuro as above GU as above Interactive and getting along well at home.  Otherwise, ROS is as  per the HPI.  Objective:   Physical Examination: BP 126/87  Pulse 86  Temp(Src) 98.7 F (37.1 C) (Oral)  Ht 5' 1.5" (1.562 m)  Wt 185 lb (83.915 kg)  BMI 34.39 kg/m2  Ideal Body Weight: Weight in (lb) to have BMI = 25: 134.2   GEN: WDWN, NAD, Non-toxic, A & O x 3 HEENT: Atraumatic, Normocephalic. Neck supple. No masses, No LAD. Ears and Nose: No external deformity. CV: RRR, No M/G/R. No JVD. No thrill. No extra heart sounds. PULM: CTA B, no wheezes, crackles, rhonchi. No retractions. No resp. distress. No accessory muscle use. EXTR: No c/c/e PSYCH: Normally interactive. Conversant. Not depressed or anxious appearing.  Calm demeanor.   Neuro: CN 2-12 grossly intact. PERRLA. EOMI. Sensation intact throughout. Str 5/5 all extremities. DTR 2+. No clonus. A and o x 4. Romberg POSITIVE. UNABLE TO DO TANDEM GAIT. VERY UNSTEADY. UNSTEADY ON FOOT FOOT. Finger nose neg. Heel -shin POSITIVE  PSYCH: Normally interactive. Conversant. Not depressed or anxious appearing.  Calm demeanor.     Laboratory and Imaging Data: Recent Results (from the past 2160 hour(s))  POCT URINALYSIS DIPSTICK     Status: Normal   Collection Time    05/28/13  4:17 PM      Result Value Ref Range   Color, UA yellow     Clarity, UA clear     Glucose, UA negative     Bilirubin, UA negative     Ketones, UA negative     Spec Grav, UA 1.015     Blood, UA negative     pH, UA 5.0     Protein, UA negative     Urobilinogen, UA 0.2     Nitrite, UA negative     Leukocytes, UA Negative      Comprehensive Metabolic Panel:    Component Value Date/Time   NA 138 01/24/2013 0837   K 4.7 01/24/2013 0837   CL 105 01/24/2013 0837    CO2 27 01/24/2013 0837   BUN 12 01/24/2013 0837   CREATININE 1.1 01/24/2013 0837   GLUCOSE 102* 01/24/2013 0837   CALCIUM 9.5 01/24/2013 0837   AST 21 01/24/2013 0837   ALT 18 01/24/2013 0837   ALKPHOS 76 01/24/2013 0837   BILITOT 0.6 01/24/2013 0837   PROT 7.3 01/24/2013 0837   ALBUMIN 3.9 01/24/2013 0837   Lab Results  Component Value Date   TSH 1.64 01/24/2013   CBC:    Component Value Date/Time   WBC 8.6 01/24/2013 0837   HGB 15.0 01/24/2013 0837   HCT 44.8 01/24/2013 0837   PLT 203.0 01/24/2013 0837   MCV 95.6 01/24/2013 0837   NEUTROABS 4.4 01/24/2013 0837   LYMPHSABS 3.4 01/24/2013 0837   MONOABS 0.5 01/24/2013 0837   EOSABS 0.2 01/24/2013 0837   BASOSABS 0.1 01/24/2013 0837     Assessment & Plan:   Abnormality of gait - Plan: MR Brain Wo Contrast  Burning with urination - Plan: POCT Urinalysis Dipstick, Urine culture  Abnormal neurological exam - Plan: MR Brain Wo Contrast  Dizzy - Plan: MR Brain Wo Contrast  Clinical concern for potential 57 week old neurological event in altered gait, ataxia, cerebellar dysfunction on exam - patient may have had a subacute CVA, seems less likely to be MS event.  MRI of the brain without contrast to evaluate.   UA appears clear. Given written script. If culture is positive, then start ABX, if neg then this is not cause of symptoms.  No  Follow-up on file.  Orders Placed This Encounter  Procedures  . Urine culture  . MR Brain Wo Contrast  . POCT Urinalysis Dipstick   Patient's Medications  New Prescriptions   CIPROFLOXACIN (CIPRO) 500 MG TABLET    Take 1 tablet (500 mg total) by mouth 2 (two) times daily.  Previous Medications   ALPRAZOLAM (XANAX) 0.5 MG TABLET    Take 0.5 mg by mouth 2 (two) times daily as needed. For anxiety.   BUDESONIDE-FORMOTEROL (SYMBICORT) 160-4.5 MCG/ACT INHALER    Inhale 2 puffs into the lungs 2 (two) times daily.   CRESTOR 20 MG TABLET    TAKE 1/2 TABLET BY MOUTH DAILY   FUROSEMIDE (LASIX)  20 MG TABLET    Take 20 mg by mouth daily.   GABAPENTIN (NEURONTIN) 300 MG CAPSULE    TAKE 2 CAPSULES (600 MG TOTAL) BY MOUTH AT BEDTIME.   GUAIFENESIN (MUCINEX) 600 MG 12 HR TABLET    Take 1,200 mg by mouth as needed.    OMEPRAZOLE (PRILOSEC) 40 MG CAPSULE    Take 1 capsule (40 mg total) by mouth 2 (two) times daily.   VENTOLIN HFA 108 (90 BASE) MCG/ACT INHALER    INHALE 2 PUFFS UP TO EVERY 4 HOURS AS NEEDED FOR WHEEZING  Modified Medications   No medications on file  Discontinued Medications   No medications on file   Patient Instructions  REFERRALS TO SPECIALISTS, SPECIAL TESTS (MRI, CT, ULTRASOUNDS)  GO THE WAITING ROOM AND TELL CHECK IN YOU NEED HELP WITH A REFERRAL. Either MARION or LINDA will help you set it up.  If it is between 1-2 PM they may be at lunch.  After 5 PM, they will likely be at home.  They will call you, so please make sure the office has your correct phone number.  Referrals sometimes can be done same day if urgent, but others can take 2 or 3 days to get an appointment. Starting in 2015, many of the new Medicare insurance plans and [place] (Obamacare) Health plans offered take much longer for referrals. They have added additional paperwork and steps.  MRI's and CT's can take up to a week for the test. (Emergencies like strokes take precedence. I will tell you if you have an emergency.)   If your referral is to an [facility] office, their office may contact you directly prior to our office reaching you.  -- Examples: [facility], [facility], [place], [facility], [facility], and many more.  Specialist appointment times vary a great deal, mostly on the specialist's schedule and if they have openings. -- Our office tries to get you in as fast as possible. -- Some specialists have very long wait times. (Example. Dermatology. Usually months) -- If you have a true emergency like new cancer,  we work to get you in ASAP.     Signed,  Maud Deed. Kynslei Art, MD, [place] at [facility] [place] [address] Phone: [phone] Fax: [phone]

## 2013-05-28 NOTE — Progress Notes (Signed)
Pre visit review using our clinic review tool, if applicable. No additional management support is needed unless otherwise documented below in the visit note. 

## 2013-05-30 ENCOUNTER — Ambulatory Visit (HOSPITAL_COMMUNITY)
Admission: RE | Admit: 2013-05-30 | Discharge: 2013-05-30 | Disposition: A | Discharge status: Home / Self Care | Payer: Medicare HMO | Source: Ambulatory Visit | Attending: Family Medicine | Admitting: Family Medicine

## 2013-05-30 DIAGNOSIS — G35 Multiple sclerosis: Secondary | ICD-10-CM | POA: Insufficient documentation

## 2013-05-30 DIAGNOSIS — R2990 Unspecified symptoms and signs involving the nervous system: Secondary | ICD-10-CM

## 2013-05-30 DIAGNOSIS — R42 Dizziness and giddiness: Secondary | ICD-10-CM

## 2013-05-30 DIAGNOSIS — R269 Unspecified abnormalities of gait and mobility: Secondary | ICD-10-CM | POA: Insufficient documentation

## 2013-05-31 ENCOUNTER — Other Ambulatory Visit: Payer: Self-pay | Admitting: Family Medicine

## 2013-05-31 ENCOUNTER — Telehealth: Payer: Self-pay | Admitting: Neurology

## 2013-05-31 DIAGNOSIS — R928 Other abnormal and inconclusive findings on diagnostic imaging of breast: Secondary | ICD-10-CM

## 2013-05-31 LAB — URINE CULTURE: Colony Count: 25000

## 2013-05-31 NOTE — Telephone Encounter (Signed)
Pt called states Dr. Glori Bickers saw the MRI results and advised pt the problems could be coming from her MS and that Dr. Jannifer Franklin needs to call pt and go over the results and decide what she should do next. Thanks

## 2013-05-31 NOTE — Telephone Encounter (Signed)
Pt calling stating that she saw Dr. Glori Bickers and the doctor saw the results of the MRI and advised the pt to contact Dr. Jannifer Franklin concerning the MRI and that her problems could be coming from her MS. Pt would like Dr. Jannifer Franklin to give her a call please. Thanks

## 2013-05-31 NOTE — Telephone Encounter (Signed)
I called patient. Within the last week, she had onset of some dizziness and gait instability. MRI the brain was done, this shows a new right temporal lesion. The patient believes that she is unable to get on a new medication because she has a high of intractable insurance, with a $4000 deductible on the front end. I'll try to find out whether the patient is eligible for any of the patient assistance programs for medications. The patient needs to be on any disease modifying agent for her multiple sclerosis.  MRI brain 05/31/2013:  IMPRESSION:  Multiple white matter lesions consistent with the clinical diagnosis  of multiple sclerosis. The majority of these are unchanged since  September 2013. However, there are new areas of demyelination in the  right temporal lobe periventricular white matter that could be  responsible for new symptoms.

## 2013-06-01 ENCOUNTER — Telehealth: Payer: Self-pay | Admitting: Neurology

## 2013-06-01 NOTE — Telephone Encounter (Signed)
I called patient. I discussed possibly going on Gilenya with her. The patient was given and 800 number to call to see what kind of patient assistance that she might be a will to get given her insurance situation and income situation.

## 2013-06-01 NOTE — Telephone Encounter (Signed)
Message copied by Margette Fast on Fri Jun 01, 2013  3:13 PM ------      Message from: Yates Decamp      Created: Fri Jun 01, 2013  7:59 AM      Regarding: Co-Pay Assistance       Good Morning Dr Jannifer Franklin,       Each manufacturer has their own assistance available.  If, for some reason, the patient does not qualify, there are non-profit organizations that may be able to help.  The patient will have to provide her income info as well as specific ins info to the program so they can determine eligibility.        The following is contact info for patient to call the manufacturers:      Avonex, Plegridy, Tecfidera and Tysabri = [phone]      Copaxone = [phone]      Gilenya = [phone]      Rebif = [phone]      Once a drug is chosen, I can gladly speak with the patient regarding this.       Thank you,      Janett Billow ------

## 2013-06-05 ENCOUNTER — Ambulatory Visit
Admission: RE | Admit: 2013-06-05 | Discharge: 2013-06-05 | Disposition: A | Discharge status: Home / Self Care | Payer: Medicare HMO | Source: Ambulatory Visit | Attending: Family Medicine | Admitting: Family Medicine

## 2013-06-05 DIAGNOSIS — R928 Other abnormal and inconclusive findings on diagnostic imaging of breast: Secondary | ICD-10-CM

## 2013-06-13 ENCOUNTER — Telehealth: Payer: Self-pay | Admitting: Family Medicine

## 2013-06-13 NOTE — Telephone Encounter (Signed)
Pt dropped off a prescription savings program application for Crestor for Dr. Glori Bickers to complete. Pt would like a call when this has been completed and faxed.

## 2013-06-13 NOTE — Telephone Encounter (Signed)
Form placed in your inbox

## 2013-06-14 ENCOUNTER — Encounter: Payer: Self-pay | Admitting: Gastroenterology

## 2013-06-14 ENCOUNTER — Other Ambulatory Visit: Payer: Self-pay

## 2013-06-17 NOTE — Telephone Encounter (Signed)
Done and in IN box 

## 2013-06-19 NOTE — Telephone Encounter (Signed)
Forms faxed and copy mailed to pt

## 2013-07-09 ENCOUNTER — Emergency Department: Payer: Self-pay | Admitting: Emergency Medicine

## 2013-07-09 LAB — BASIC METABOLIC PANEL
Anion Gap: 9 (ref 7–16)
BUN: 16 mg/dL (ref 7–18)
Calcium, Total: 9.6 mg/dL (ref 8.5–10.1)
Chloride: 105 mmol/L (ref 98–107)
Co2: 22 mmol/L (ref 21–32)
Creatinine: 1.13 mg/dL (ref 0.60–1.30)
EGFR (African American): >60
EGFR (Non-African Amer.): 55 — ABNORMAL LOW
Glucose: 96 mg/dL (ref 65–99)
Osmolality: 273 (ref 275–301)
Potassium: 4.2 mmol/L (ref 3.5–5.1)
Sodium: 136 mmol/L (ref 136–145)

## 2013-07-09 LAB — CBC
HCT: 45 % (ref 35.0–47.0)
HGB: 15.1 g/dL (ref 12.0–16.0)
MCH: 32.4 pg (ref 26.0–34.0)
MCHC: 33.6 g/dL (ref 32.0–36.0)
MCV: 96 fL (ref 80–100)
Platelet: 147 10*3/uL — ABNORMAL LOW (ref 150–440)
RBC: 4.67 10*6/uL (ref 3.80–5.20)
RDW: 13.6 % (ref 11.5–14.5)
WBC: 6.5 10*3/uL (ref 3.6–11.0)

## 2013-07-09 LAB — TROPONIN I: Troponin-I: <0.02 ng/mL

## 2013-07-09 LAB — MONONUCLEOSIS SCREEN: Mono Test: POSITIVE

## 2013-07-09 LAB — RAPID INFLUENZA A&B ANTIGENS

## 2013-07-10 ENCOUNTER — Telehealth: Payer: Self-pay | Admitting: Family Medicine

## 2013-07-10 NOTE — Telephone Encounter (Signed)
I will see her tomorrow

## 2013-07-10 NOTE — Telephone Encounter (Signed)
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  Please notify patient of instructions.  RN Note:  Appts full;  This is also an er f/u visit patient is requesting but triage disposition is to go to office now;  Appts are full in emr;  Back line called and transferred to [facility] for advice if patient needs worked in to office or referred to another location,  but line unavailable.  Note will be sent for advice for patient to be worked in now at office or seen at another location.    Symptoms  Reason For Call & Symptoms: Seen in [place] er last night for congestion, cough, difficulty breathing, ha;   Diagnosed with mono;  5/5 still coughing, feels like she can't breath, has a runny nose, feels hot;  Not sure if she has fever, no thermometer.  Reviewed Health History In EMR: Yes  Reviewed Medications In EMR: Yes  Reviewed Allergies In EMR: Yes  Reviewed Surgeries / Procedures: Yes  Date of Onset of Symptoms: 07/07/2013  Treatments Tried: Inhaler, Mucinex, Robitussin  Treatments Tried Worked: No OB / GYN:  LMP: Unknown  Guideline(s) Used:  Cough  Disposition Per Guideline:   Go to Office Now  Reason For Disposition Reached:   Wheezing is present  Advice Given:  Cough Medicines:  OTC Cough Drops: Cough drops can help a lot, especially for mild coughs. They reduce coughing by soothing your irritated throat and removing that tickle sensation in the back of the throat. Cough drops also have the advantage of portability - you can carry them with you.  Home Remedy - Honey: This old home remedy has been shown to  help decrease coughing at night. The adult dosage is 2 teaspoons (10 ml) at bedtime. Honey should not be given to infants under one year of age.  OTC Cough Syrup - Dextromethorphan:  Cough syrups containing the cough suppressant dextromethorphan (DM) may help decrease your cough. Cough syrups work best for coughs that keep you awake at night. They can also sometimes help in the late stages of a respiratory infection when the cough is dry and hacking. They can be used along with cough drops.  Coughing Spasms:  Drink warm fluids. Inhale warm mist (Reason: both relax the airway and loosen up the phlegm).  Suck on cough drops or hard candy to coat the irritated throat.  Prevent Dehydration:  Drink adequate liquids.  This will help soothe an irritated or dry throat and loosen up the phlegm.  Call Back If:  Difficulty breathing  Cough lasts more than 3 weeks  Fever lasts > 3 days  You become worse.  Patient Will Follow Care Advice:  YES

## 2013-07-10 NOTE — Telephone Encounter (Signed)
Thank you -I rev records-if no avail appts here or at another [place] I think she needs to go to UC or ER for eval given the breathing issue I am leaving the office now unfortunately  Please let me know

## 2013-07-10 NOTE — Telephone Encounter (Signed)
Please call and see what is going on -thanks , sounds like they could not get through to [facility]

## 2013-07-10 NOTE — Telephone Encounter (Signed)
Received records and placed them in your inbox

## 2013-07-10 NOTE — Telephone Encounter (Signed)
Pt went to [facility] last night with, SOB, cough, congestion. Pt said they did a nasal swab and dx her with mono. Pt said she had 2 breathing treatments while at the hospital. Pt woke up today with no improvement in her sxs (coughing spells, SOB when coughing, and chills but she has not taken temp. No appts available in office, pt wants to know what she should do, please advise   *I have sent request for records form [facility]*

## 2013-07-10 NOTE — Telephone Encounter (Signed)
Pt advise that Dr. Glori Bickers wants her to go back to ER or UC, pt said she is laying down and she is feeling a little better so she didn't want to go back to ER, I scheduled an appt with you tomorrow at 11:45 but I did advise pt that if her sxs worsen over night she needs to go back to ER, pt verbalized understanding

## 2013-07-11 ENCOUNTER — Encounter: Payer: Self-pay | Admitting: Family Medicine

## 2013-07-11 ENCOUNTER — Ambulatory Visit (INDEPENDENT_AMBULATORY_CARE_PROVIDER_SITE_OTHER): Payer: Medicare HMO | Admitting: Family Medicine

## 2013-07-11 VITALS — BP 122/84 | HR 91 | Temp 98.6°F | Ht 61.5 in | Wt 182.2 lb

## 2013-07-11 DIAGNOSIS — J209 Acute bronchitis, unspecified: Secondary | ICD-10-CM

## 2013-07-11 DIAGNOSIS — B2790 Infectious mononucleosis, unspecified without complication: Secondary | ICD-10-CM | POA: Insufficient documentation

## 2013-07-11 DIAGNOSIS — R062 Wheezing: Secondary | ICD-10-CM

## 2013-07-11 MED ORDER — GUAIFENESIN-CODEINE 100-10 MG/5ML PO SYRP: 5.0000 mL | ORAL_SOLUTION | Freq: Four times a day (QID) | ORAL | Status: DC | PRN

## 2013-07-11 MED ORDER — ALBUTEROL SULFATE (2.5 MG/3ML) 0.083% IN NEBU
2.5000 mg | INHALATION_SOLUTION | Freq: Once | RESPIRATORY_TRACT | Status: AC
  Administered 2013-07-11: 2.5 mg via RESPIRATORY_TRACT

## 2013-07-11 MED ORDER — PREDNISONE 10 MG PO TABS: ORAL_TABLET | ORAL | Status: DC

## 2013-07-11 MED ORDER — BUDESONIDE-FORMOTEROL FUMARATE 160-4.5 MCG/ACT IN AERO: 2.0000 | INHALATION_SPRAY | Freq: Two times a day (BID) | RESPIRATORY_TRACT | Status: DC

## 2013-07-11 MED ORDER — PROMETHAZINE HCL 25 MG PO TABS: 25.0000 mg | ORAL_TABLET | Freq: Three times a day (TID) | ORAL | Status: DC | PRN

## 2013-07-11 NOTE — Patient Instructions (Signed)
Drink fluids and get rest  Take the prednisone with little food as directed  Use your albuterol inhaler as needed for wheezing  Phenergan as needed for nausea- careful of sedation  Robitussin ac for cough - this has codeine and you have taken it in the past but if you have side effects stop it and let us know- also careful of sedation Update if not starting to improve in a week or if worsening     When done with the prednisone get back on your symbicort

## 2013-07-11 NOTE — Progress Notes (Signed)
Subjective:    Patient ID: Karina [name], female    DOB: [birth year], 58 y.o.   MRN: [id number]  HPI Here for f/u after ER visit - at [facility]  She thought she got sick from her husband  Started Saturday - and went to ER on Monday  Dx with mono  Did not give her any medicines   Throat hurts - raw/not severe  Mouth is "raw" and gums hurt  Bad cough on and off -occ phlegm - white in color  Headache - face and head -- takes advil for that  Wheezing - since sat -and getting worse   Out of symbicort Uses rescue inhaler 3-4 times per day  Not tremendously helpful   Did NMT just now albuterol   She had also inhaled some paint fumes -thought that started it   Patient Active Problem List   Diagnosis Date Noted  . Infectious mononucleosis 07/11/2013  . Bronchitis, acute, with bronchospasm 07/11/2013  . Encounter for routine gynecological examination 01/31/2013  . Acute sinusitis 01/31/2013  . Dysuria 01/31/2013  . Encounter for Medicare annual wellness exam 01/23/2013  . Contusion of lower leg, left 10/04/2012  . Chest pain 09/20/2012  . SOB (shortness of breath) 09/20/2012  . Dyspnea 09/13/2012  . Asthmatic bronchitis , chronic 12/06/2011  . Chronic cough 11/22/2011  . OTHER DISORDERS OF EYELID 04/02/2010  . ANXIETY, SITUATIONAL 07/17/2009  . BACK PAIN, LUMBAR, WITH RADICULOPATHY 12/27/2008  . HEMORRHOIDS-EXTERNAL 11/07/2008  . IRRITABLE BOWEL SYNDROME 11/07/2008  . LUNG NODULE 10/14/2008  . HYPERGLYCEMIA, BORDERLINE 10/14/2008  . PERSONAL HX COLONIC POLYPS 10/08/2008  . UNSPECIFIED VITAMIN D DEFICIENCY 07/23/2008  . SYNCOPE, HX OF 06/01/2008  . FOOT SURGERY, HX OF 06/01/2008  . DILATION AND CURETTAGE, HX OF 06/01/2008  . OSTEOARTHRITIS, SHOULDER, RIGHT 03/22/2008  . ROTATOR CUFF SYNDROME, RIGHT 03/22/2008  . ARTHRALGIA 12/12/2007  . ALLERGIC RHINITIS 08/01/2007  . Smoker 10/17/2006  . NEUROPATHY-PERIPHERAL 10/17/2006  . FOOT PAIN, BILATERAL 10/17/2006  . SYMPTOM,  EDEMA 10/03/2006  . HYPOGLYCEMIA 09/30/2006  . HYPERCHOLESTEROLEMIA 09/30/2006  . MULTIPLE SCLEROSIS 09/30/2006  . GERD 09/30/2006  . FIBROCYSTIC BREAST DISEASE 09/30/2006  . SEIZURE DISORDER 09/30/2006  . DIZZINESS OR VERTIGO 09/30/2006  . URINARY INCONTINENCE 09/30/2006   Past Medical History  Diagnosis Date  . Vertigo   . GERD (gastroesophageal reflux disease)   . Seizure disorder     single seizure/hypoglycemia  . Urinary incontinence   . Plantar fasciitis   . Colon polyps 08/2008    Adenomatous  . Gastritis 08/2008    H. pylori  . Lung nodule   . Menopausal disorder   . Anxiety     [facility]  . Multiple sclerosis   . Allergic rhinitis   . Hypercholesteremia   . Diabetes mellitus   . Depression   . COPD (chronic obstructive pulmonary disease)   . History of shingles     Left V1   Past Surgical History  Procedure Laterality Date  . Cervical discectomy  2004    x 2, Anterior; Fusion C4-5, C5-6, C6-7, Dr. Kary Kos  . Tubal ligation    . Dilation and curettage of uterus    . Tonsillectomy    . Foot surgery      left plantar fascial problem  . Cholecystectomy    . Chest ct  11/2008    With small 4 mm nodule L lung base (rec re check in 1 year)  . Chest ct  07/2009  Re check chest CT - lung nodule stable  . Ct sinus ltd w/o cm  02/2009    negative  . Colonoscopy  08/2008    Polyps/ re check 5 yrs  . Esophagogastroduodenoscopy  08/2008    Erosive gastritis, h pylori (treated)  . Rotator cuff repair      right   History  Substance Use Topics  . Smoking status: Former Smoker -- 30 years    Types: Cigarettes    Quit date: 06/27/2013  . Smokeless tobacco: Never Used  . Alcohol Use: No   Family History  Problem Relation Age of Onset  . Migraines Mother   . Heart attack Mother   . Coronary artery disease Mother   . Coronary artery disease Father     6 bypasses   . Stroke Father   . Diabetes Father   . Renal Disease Father   . Colon  cancer Maternal Grandfather 5  . Coronary artery disease Cousin   . Coronary artery disease Paternal Grandmother   . Colon cancer Paternal Aunt 82  . Lung cancer Paternal Aunt   . Multiple sclerosis Neg Hx    Allergies  Allergen Reactions  . Percocet [Oxycodone-Acetaminophen] Shortness Of Breath    Felt like going to pass out and sweating  . Tecfidera [Dimethyl Fumarate] Shortness Of Breath, Palpitations, Rash and Cough  . Amitriptyline Hcl     REACTION: swelling and itching  . Atorvastatin     REACTION: elevated LFT's  . Clarithromycin     REACTION: reaction not known  . Duloxetine     REACTION: pain  . Levofloxacin     REACTION: Rash  . Pregabalin     REACTION: LE swelling  . Pseudoephedrine     REACTION: legs hurt  . Zoloft [Sertraline Hcl] Other (See Comments)    Headaches    Current Outpatient Prescriptions on File Prior to Visit  Medication Sig Dispense Refill  . ALPRAZolam (XANAX) 0.5 MG tablet Take 0.5 mg by mouth 2 (two) times daily as needed. For anxiety.      . ciprofloxacin (CIPRO) 500 MG tablet Take 1 tablet (500 mg total) by mouth 2 (two) times daily.  14 tablet  0  . CRESTOR 20 MG tablet TAKE 1/2 TABLET BY MOUTH DAILY  15 tablet  2  . furosemide (LASIX) 20 MG tablet Take 20 mg by mouth daily.      Marland Kitchen gabapentin (NEURONTIN) 300 MG capsule TAKE 2 CAPSULES (600 MG TOTAL) BY MOUTH AT BEDTIME.  60 capsule  11  . guaiFENesin (MUCINEX) 600 MG 12 hr tablet Take 1,200 mg by mouth as needed.       Marland Kitchen omeprazole (PRILOSEC) 40 MG capsule Take 1 capsule (40 mg total) by mouth 2 (two) times daily.  60 capsule  11  . VENTOLIN HFA 108 (90 BASE) MCG/ACT inhaler INHALE 2 PUFFS UP TO EVERY 4 HOURS AS NEEDED FOR WHEEZING  1 Inhaler  11   No current facility-administered medications on file prior to visit.      Review of Systems Review of Systems  Constitutional: Negative for fever, appetite change,  and unexpected weight change.  ENT pos for congestion/ rhinorrhea/st Eyes:  Negative for pain and visual disturbance.  Respiratory: pos for cough and shortness of breath.  pos for wheezing  Cardiovascular: Negative for cp or palpitations    Gastrointestinal: Negative for nausea, diarrhea and constipation.  Genitourinary: Negative for urgency and frequency.  Skin: Negative for pallor or rash  Neurological: Negative for weakness, light-headedness, numbness and headaches.  Hematological: Negative for adenopathy. Does not bruise/bleed easily.  Psychiatric/Behavioral: Negative for dysphoric mood. The patient is not nervous/anxious.         Objective:   Physical Exam  Constitutional: She appears well-developed and well-nourished. No distress.  Fatigued appearing   HENT:  Head: Normocephalic and atraumatic.  Right Ear: External ear normal.  Left Ear: External ear normal.  Mouth/Throat: Oropharynx is clear and moist. No oropharyngeal exudate.  Nares are injected and congested  No sinus tenderness Throat- post erythema/no swelling or exudate   Eyes: Conjunctivae and EOM are normal. Pupils are equal, round, and reactive to light. Right eye exhibits no discharge. No scleral icterus.  No eye or facial swelling  Neck: Normal range of motion. Neck supple.  Cardiovascular: Regular rhythm.   slt tachycardia after NMT  Pulmonary/Chest: Effort normal. No respiratory distress. She has wheezes. She has no rales. She exhibits tenderness.  Diffuse exp wheezes with prolonged exp phase- resolved after albuterol NMT Air exch is then good with occ faint rhonchi and baseline distant bs  No rales Tender chest wall diffusely  Abdominal: Soft. Bowel sounds are normal. She exhibits no distension and no mass. There is no tenderness.  No HSM  Musculoskeletal: She exhibits no edema.  Lymphadenopathy:    She has no cervical adenopathy.  Neurological: She is alert.  Skin: Skin is warm and dry. No rash noted. No pallor.  Psychiatric: She has a normal mood and affect.            Assessment & Plan:

## 2013-07-11 NOTE — Progress Notes (Signed)
Pre visit review using our clinic review tool, if applicable. No additional management support is needed unless otherwise documented below in the visit note. 

## 2013-07-12 NOTE — Assessment & Plan Note (Signed)
With recent pos mono test  Disc viral nature of illness Prednisone taper -disc side eff Albuterol mdi/ also ref her symbicort  Robitussin AC for cough - she has taken this in the past  Update if not starting to improve in a week or if worsening

## 2013-07-12 NOTE — Assessment & Plan Note (Addendum)
Dx in ER- records rev from [facility] in detail incl lab and cxr  Robitussin with codine and pred taper px for symptoms Phenergan for nausea  Rest/ fluids Parameters for return or ER disc -ex: if unable to swallow  Unusual there is no cervical adenopathy on exam

## 2013-07-27 ENCOUNTER — Encounter: Payer: Self-pay | Admitting: Family Medicine

## 2013-09-26 ENCOUNTER — Encounter: Payer: Self-pay | Admitting: Gastroenterology

## 2013-09-28 ENCOUNTER — Ambulatory Visit (AMBULATORY_SURGERY_CENTER): Payer: Self-pay | Admitting: *Deleted

## 2013-09-28 VITALS — Ht 61.5 in | Wt 181.0 lb

## 2013-09-28 DIAGNOSIS — Z86010 Personal history of colonic polyps: Secondary | ICD-10-CM

## 2013-09-28 MED ORDER — MOVIPREP 100 G PO SOLR: ORAL | Status: DC

## 2013-09-28 NOTE — Progress Notes (Signed)
No egg or soy allergy  No anesthesia or intubation problems per pt  No diet medications taken  Registered in EMMI   

## 2013-10-11 ENCOUNTER — Encounter: Payer: Self-pay | Admitting: Gastroenterology

## 2013-10-11 ENCOUNTER — Ambulatory Visit (AMBULATORY_SURGERY_CENTER): Payer: Medicare HMO | Admitting: Gastroenterology

## 2013-10-11 VITALS — BP 115/67 | HR 61 | Temp 98.7°F | Resp 15 | Ht 61.0 in | Wt 181.0 lb

## 2013-10-11 DIAGNOSIS — Z86010 Personal history of colonic polyps: Secondary | ICD-10-CM

## 2013-10-11 DIAGNOSIS — D122 Benign neoplasm of ascending colon: Secondary | ICD-10-CM

## 2013-10-11 DIAGNOSIS — D126 Benign neoplasm of colon, unspecified: Secondary | ICD-10-CM

## 2013-10-11 MED ORDER — SODIUM CHLORIDE 0.9 % IV SOLN: 500.0000 mL | INTRAVENOUS | Status: DC

## 2013-10-11 NOTE — Progress Notes (Signed)
Called to room to assist during endoscopic procedure.  Patient ID and intended procedure confirmed with present staff. Received instructions for my participation in the procedure from the performing physician.  

## 2013-10-11 NOTE — Op Note (Signed)
[address]  [address]. [place], [address]   COLONOSCOPY PROCEDURE REPORT  PATIENT: Karina, [name]  MR#: [id number] BIRTHDATE: 07-12-1955 , 76  yrs. old GENDER: Female ENDOSCOPIST: Ladene Artist, MD, [facility] PROCEDURE DATE:  10/11/2013 PROCEDURE:   Colonoscopy with snare polypectomy First Screening Colonoscopy - Avg.  risk and is 50 yrs.  old or older - No.  Prior Negative Screening - Now for repeat screening. N/A  History of Adenoma - Now for follow-up colonoscopy & has been > or = to 3 yrs.  Yes hx of adenoma.  Has been 3 or more years since last colonoscopy.  Polyps Removed Today? Yes. ASA CLASS:   Class III INDICATIONS:Patient's personal history of adenomatous colon polyps.  MEDICATIONS: MAC sedation, administered by CRNA and propofol (Diprivan) 300mg  IV DESCRIPTION OF PROCEDURE:   After the risks benefits and alternatives of the procedure were thoroughly explained, informed consent was obtained.  A digital rectal exam revealed no abnormalities of the rectum.   The LB [id number]  endoscope was introduced through the anus and advanced to the cecum, which was identified by both the appendix and ileocecal valve. No adverse events experienced.   The quality of the prep was good, using MoviPrep  The instrument was then slowly withdrawn as the colon was fully examined.  COLON FINDINGS: A sessile polyp measuring 5 mm in size was found in the ascending colon.  A polypectomy was performed with a cold snare.  The resection was complete and the polyp tissue was completely retrieved.   The colon was otherwise normal.  There was no diverticulosis, inflammation, polyps or cancers unless previously stated.  Retroflexed views revealed moderate internal hemorrhoids. The time to cecum=2 minutes 09 seconds.  Withdrawal time=12 minutes 37 seconds.  The scope was withdrawn and the procedure completed.  COMPLICATIONS: There were no complications.  ENDOSCOPIC  IMPRESSION: 1.   Sessile polyp measuring 5 mm in the ascending colon; polypectomy performed with a cold snare 2.   Moderate internal hemorrhoids  RECOMMENDATIONS: 1.  Await pathology results 2.  Repeat Colonoscopy in 5 years.  eSigned:  Ladene Artist, MD, [facility] 10/11/2013 10:22 AM

## 2013-10-11 NOTE — Patient Instructions (Signed)
Impressions/recommendations:  Polyp (handout given) Hemorrhoids (handout given)  Repeat colonoscopy in 5 years.  YOU HAD AN ENDOSCOPIC PROCEDURE TODAY AT [facility]: Refer to the procedure report that was given to you for any specific questions about what was found during the examination.  If the procedure report does not answer your questions, please call your gastroenterologist to clarify.  If you requested that your care partner not be given the details of your procedure findings, then the procedure report has been included in a sealed envelope for you to review at your convenience later.  YOU SHOULD EXPECT: Some feelings of bloating in the abdomen. Passage of more gas than usual.  Walking can help get rid of the air that was put into your GI tract during the procedure and reduce the bloating. If you had a lower endoscopy (such as a colonoscopy or flexible sigmoidoscopy) you may notice spotting of blood in your stool or on the toilet paper. If you underwent a bowel prep for your procedure, then you may not have a normal bowel movement for a few days.  DIET: Your first meal following the procedure should be a light meal and then it is ok to progress to your normal diet.  A half-sandwich or bowl of soup is an example of a good first meal.  Heavy or fried foods are harder to digest and may make you feel nauseous or bloated.  Likewise meals heavy in dairy and vegetables can cause extra gas to form and this can also increase the bloating.  Drink plenty of fluids but you should avoid alcoholic beverages for 24 hours.  ACTIVITY: Your care partner should take you home directly after the procedure.  You should plan to take it easy, moving slowly for the rest of the day.  You can resume normal activity the day after the procedure however you should NOT DRIVE or use heavy machinery for 24 hours (because of the sedation medicines used during the test).    SYMPTOMS TO REPORT IMMEDIATELY: A  gastroenterologist can be reached at any hour.  During normal business hours, 8:30 AM to 5:00 PM Monday through Friday, call [phone].  After hours and on weekends, please call the GI answering service at [phone] who will take a message and have the physician on call contact you.   Following lower endoscopy (colonoscopy or flexible sigmoidoscopy):  Excessive amounts of blood in the stool  Significant tenderness or worsening of abdominal pains  Swelling of the abdomen that is new, acute  Fever of 100F or higher   FOLLOW UP: If any biopsies were taken you will be contacted by phone or by letter within the next 1-3 weeks.  Call your gastroenterologist if you have not heard about the biopsies in 3 weeks.  Our staff will call the home number listed on your records the next business day following your procedure to check on you and address any questions or concerns that you may have at that time regarding the information given to you following your procedure. This is a courtesy call and so if there is no answer at the home number and we have not heard from you through the emergency physician on call, we will assume that you have returned to your regular daily activities without incident.  SIGNATURES/CONFIDENTIALITY: You and/or your care partner have signed paperwork which will be entered into your electronic medical record.  These signatures attest to the fact that that the information above on your After  Visit Summary has been reviewed and is understood.  Full responsibility of the confidentiality of this discharge information lies with you and/or your care-partner. 

## 2013-10-11 NOTE — Progress Notes (Signed)
Report to PACU, RN, vss, BBS= Clear.  

## 2013-10-12 ENCOUNTER — Telehealth: Payer: Self-pay

## 2013-10-12 NOTE — Telephone Encounter (Signed)
  Follow up Call-  Call back number 10/11/2013  Post procedure Call Back phone  # [phone]  Permission to leave phone message No     Patient questions:  Do you have a fever, pain , or abdominal swelling? No. Pain Score  0 *  Have you tolerated food without any problems? Yes.    Have you been able to return to your normal activities? Yes.    Do you have any questions about your discharge instructions: Diet   No. Medications  No. Follow up visit  No.  Do you have questions or concerns about your Care? No.  Actions: * If pain score is 4 or above: No action needed, pain <4.  No problems per the pt. maw

## 2013-10-18 ENCOUNTER — Encounter: Payer: Self-pay | Admitting: Gastroenterology

## 2013-10-29 ENCOUNTER — Other Ambulatory Visit: Payer: Self-pay | Admitting: Family Medicine

## 2013-10-29 DIAGNOSIS — N63 Unspecified lump in unspecified breast: Secondary | ICD-10-CM

## 2013-11-14 ENCOUNTER — Ambulatory Visit
Admission: RE | Admit: 2013-11-14 | Discharge: 2013-11-14 | Disposition: A | Discharge status: Home / Self Care | Payer: Medicare HMO | Source: Ambulatory Visit | Attending: Family Medicine | Admitting: Family Medicine

## 2013-11-14 DIAGNOSIS — N63 Unspecified lump in unspecified breast: Secondary | ICD-10-CM

## 2013-11-30 ENCOUNTER — Ambulatory Visit (INDEPENDENT_AMBULATORY_CARE_PROVIDER_SITE_OTHER)
Admission: RE | Admit: 2013-11-30 | Discharge: 2013-11-30 | Disposition: A | Discharge status: Home / Self Care | Payer: Medicare HMO | Source: Ambulatory Visit | Attending: Family Medicine | Admitting: Family Medicine

## 2013-11-30 ENCOUNTER — Ambulatory Visit (INDEPENDENT_AMBULATORY_CARE_PROVIDER_SITE_OTHER): Payer: Medicare HMO | Admitting: Family Medicine

## 2013-11-30 ENCOUNTER — Encounter: Payer: Self-pay | Admitting: Family Medicine

## 2013-11-30 VITALS — BP 114/76 | HR 73 | Temp 98.4°F | Ht 61.5 in | Wt 179.5 lb

## 2013-11-30 DIAGNOSIS — M545 Low back pain, unspecified: Secondary | ICD-10-CM

## 2013-11-30 DIAGNOSIS — B373 Candidiasis of vulva and vagina: Secondary | ICD-10-CM

## 2013-11-30 DIAGNOSIS — M546 Pain in thoracic spine: Secondary | ICD-10-CM | POA: Insufficient documentation

## 2013-11-30 DIAGNOSIS — N764 Abscess of vulva: Secondary | ICD-10-CM | POA: Insufficient documentation

## 2013-11-30 DIAGNOSIS — B3731 Acute candidiasis of vulva and vagina: Secondary | ICD-10-CM

## 2013-11-30 DIAGNOSIS — R300 Dysuria: Secondary | ICD-10-CM

## 2013-11-30 DIAGNOSIS — N898 Other specified noninflammatory disorders of vagina: Secondary | ICD-10-CM

## 2013-11-30 DIAGNOSIS — J309 Allergic rhinitis, unspecified: Secondary | ICD-10-CM

## 2013-11-30 LAB — POCT URINALYSIS DIPSTICK
Bilirubin, UA: NEGATIVE
Blood, UA: NEGATIVE
Glucose, UA: NEGATIVE
Ketones, UA: NEGATIVE
Leukocytes, UA: NEGATIVE
Nitrite, UA: NEGATIVE
Protein, UA: NEGATIVE
Spec Grav, UA: 1.02
Urobilinogen, UA: 0.2
pH, UA: 5

## 2013-11-30 MED ORDER — FLUCONAZOLE 150 MG PO TABS
150.0000 mg | ORAL_TABLET | Freq: Once | ORAL | Status: DC
Start: 2013-11-30 — End: 2014-03-14

## 2013-11-30 NOTE — Progress Notes (Signed)
Subjective:    Patient ID: Karina [name], female    DOB: 07/16/1955, 58 y.o.   MRN: [id number]  HPI Here for many issues   Urinary symptoms  Itching and burning  Results for orders placed in visit on 11/30/13  POCT URINALYSIS DIPSTICK      Result Value Ref Range   Color, UA yellow     Clarity, UA clear     Glucose, UA neg.     Bilirubin, UA neg.     Ketones, UA neg.     Spec Grav, UA 1.020     Blood, UA neg.     pH, UA 5.0     Protein, UA neg.     Urobilinogen, UA 0.2     Nitrite, UA neg.     Leukocytes, UA Negative     little vaginal discharge - hard to tell if there is a color  Had a cyst on vulva - she burst it and it drained - then it started to get sore again  2 weeks  On the left side   She is worried about STD exposure - long time partner 15 years  She does not trust him  Gave her mono   Sinus symptoms - pain across her face under eyes  Nasal d/c is clear  No fever  Lot of post nasal drip  Does get allergies this time of year  No ear or throat pain   Back is painful - middle of back- worse on the R side (ever since her neck surgery) Low back and L hip hurt as well - has always been a problem but worse since moving large furniture a year ago  No radiation to leg this time   Patient Active Problem List   Diagnosis Date Noted  . Infectious mononucleosis 07/11/2013  . Bronchitis, acute, with bronchospasm 07/11/2013  . Encounter for routine gynecological examination 01/31/2013  . Dysuria 01/31/2013  . Encounter for Medicare annual wellness exam 01/23/2013  . Contusion of lower leg, left 10/04/2012  . Chest pain 09/20/2012  . SOB (shortness of breath) 09/20/2012  . Dyspnea 09/13/2012  . Asthmatic bronchitis , chronic 12/06/2011  . Chronic cough 11/22/2011  . OTHER DISORDERS OF EYELID 04/02/2010  . ANXIETY, SITUATIONAL 07/17/2009  . BACK PAIN, LUMBAR, WITH RADICULOPATHY 12/27/2008  . HEMORRHOIDS-EXTERNAL 11/07/2008  . IRRITABLE BOWEL SYNDROME  11/07/2008  . LUNG NODULE 10/14/2008  . HYPERGLYCEMIA, BORDERLINE 10/14/2008  . PERSONAL HX COLONIC POLYPS 10/08/2008  . UNSPECIFIED VITAMIN D DEFICIENCY 07/23/2008  . SYNCOPE, HX OF 06/01/2008  . FOOT SURGERY, HX OF 06/01/2008  . DILATION AND CURETTAGE, HX OF 06/01/2008  . OSTEOARTHRITIS, SHOULDER, RIGHT 03/22/2008  . ROTATOR CUFF SYNDROME, RIGHT 03/22/2008  . ARTHRALGIA 12/12/2007  . ALLERGIC RHINITIS 08/01/2007  . Smoker 10/17/2006  . NEUROPATHY-PERIPHERAL 10/17/2006  . FOOT PAIN, BILATERAL 10/17/2006  . SYMPTOM, EDEMA 10/03/2006  . HYPOGLYCEMIA 09/30/2006  . HYPERCHOLESTEROLEMIA 09/30/2006  . MULTIPLE SCLEROSIS 09/30/2006  . GERD 09/30/2006  . FIBROCYSTIC BREAST DISEASE 09/30/2006  . SEIZURE DISORDER 09/30/2006  . DIZZINESS OR VERTIGO 09/30/2006  . URINARY INCONTINENCE 09/30/2006   Past Medical History  Diagnosis Date  . Vertigo   . GERD (gastroesophageal reflux disease)   . Seizure disorder     single seizure/hypoglycemia  . Urinary incontinence   . Plantar fasciitis   . Colon polyps 08/2008    Adenomatous  . Gastritis 08/2008    H. pylori  . Lung nodule   . Menopausal disorder   .  Multiple sclerosis   . Allergic rhinitis   . Hypercholesteremia   . Diabetes mellitus   . COPD (chronic obstructive pulmonary disease)   . History of shingles     Left V1  . Allergy   . Anxiety     Councelor- Pervis Hocking, no per pt  . Depression     no per pt  . Cataract   . Neuromuscular disorder     Multiple sclerosis  . Seizures     1 seizure in 2001 due to blood sugar dropping to 38, none since   Past Surgical History  Procedure Laterality Date  . Cervical discectomy  2004    x 2, Anterior; Fusion C4-5, C5-6, C6-7, Dr. Kary Kos  . Tubal ligation    . Dilation and curettage of uterus    . Tonsillectomy    . Foot surgery      left plantar fascial problem  . Cholecystectomy    . Chest ct  11/2008    With small 4 mm nodule L lung base (rec re check in 1 year)  .  Chest ct  07/2009    Re check chest CT - lung nodule stable  . Ct sinus ltd w/o cm  02/2009    negative  . Colonoscopy  08/2008    Polyps/ re check 5 yrs  . Esophagogastroduodenoscopy  08/2008    Erosive gastritis, h pylori (treated)  . Rotator cuff repair      right  . Upper gastrointestinal endoscopy     History  Substance Use Topics  . Smoking status: Heavy Tobacco Smoker -- 1.50 packs/day for 30 years    Types: Cigarettes  . Smokeless tobacco: Never Used  . Alcohol Use: No   Family History  Problem Relation Age of Onset  . Migraines Mother   . Heart attack Mother   . Coronary artery disease Mother   . Coronary artery disease Father     6 bypasses   . Stroke Father   . Diabetes Father   . Renal Disease Father   . Colon cancer Maternal Grandfather 11  . Coronary artery disease Cousin   . Coronary artery disease Paternal Grandmother   . Lung cancer Paternal Aunt   . Multiple sclerosis Neg Hx   . Esophageal cancer Neg Hx   . Rectal cancer Neg Hx   . Stomach cancer Neg Hx    Allergies  Allergen Reactions  . Percocet [Oxycodone-Acetaminophen] Shortness Of Breath    Felt like going to pass out and sweating  . Tecfidera [Dimethyl Fumarate] Shortness Of Breath, Palpitations, Rash and Cough  . Amitriptyline Hcl     REACTION: swelling and itching  . Atorvastatin     REACTION: elevated LFT's  . Clarithromycin     REACTION: reaction not known  . Duloxetine     REACTION: pain  . Levofloxacin     REACTION: Rash  . Pregabalin     REACTION: LE swelling  . Pseudoephedrine     REACTION: legs hurt  . Zoloft [Sertraline Hcl] Other (See Comments)    Headaches    Current Outpatient Prescriptions on File Prior to Visit  Medication Sig Dispense Refill  . ALPRAZolam (XANAX) 0.5 MG tablet Take 0.5 mg by mouth 2 (two) times daily as needed. For anxiety.      . budesonide-formoterol (SYMBICORT) 160-4.5 MCG/ACT inhaler Inhale 2 puffs into the lungs 2 (two) times daily.  1  Inhaler  11  . CRESTOR 20 MG tablet TAKE  1/2 TABLET BY MOUTH DAILY  15 tablet  2  . furosemide (LASIX) 20 MG tablet Take 20 mg by mouth daily.      Marland Kitchen gabapentin (NEURONTIN) 300 MG capsule TAKE 2 CAPSULES (600 MG TOTAL) BY MOUTH AT BEDTIME.  60 capsule  11  . Probiotic Product (ALIGN PO) Take by mouth daily.      . VENTOLIN HFA 108 (90 BASE) MCG/ACT inhaler INHALE 2 PUFFS UP TO EVERY 4 HOURS AS NEEDED FOR WHEEZING  1 Inhaler  11   No current facility-administered medications on file prior to visit.      Review of Systems    Review of Systems  Constitutional: Negative for fever, appetite change,  and unexpected weight change.  ENt pos for rhinorrhea and sinus pressur  Eyes: Negative for pain and visual disturbance.  Respiratory: Negative for wheeze  and shortness of breath.   Cardiovascular: Negative for cp or palpitations    Gastrointestinal: Negative for nausea, diarrhea and constipation.  Genitourinary: pos for dysuria/ neg for frequency Skin: Negative for pallor or rash   MSK pos for mid and lower back pain  Neurological: Negative for weakness, light-headedness, numbness and headaches.  Hematological: Negative for adenopathy. Does not bruise/bleed easily.  Psychiatric/Behavioral: Negative for dysphoric mood. The patient is not nervous/anxious.      Objective:   Physical Exam  Constitutional: She appears well-developed and well-nourished. No distress.  obese and well appearing   HENT:  Head: Normocephalic and atraumatic.  Right Ear: External ear normal.  Left Ear: External ear normal.  Nose: Nose normal.  Mouth/Throat: Oropharynx is clear and moist.  Nares are boggy and congested  Clear rhinorrhea No sinus tenderness  Eyes: Conjunctivae and EOM are normal. Pupils are equal, round, and reactive to light. Right eye exhibits no discharge. Left eye exhibits no discharge. No scleral icterus.  Neck: Normal range of motion. Neck supple. No JVD present. No thyromegaly present.    Cardiovascular: Normal rate, regular rhythm, normal heart sounds and intact distal pulses.  Exam reveals no gallop.   Pulmonary/Chest: Effort normal and breath sounds normal. No respiratory distress. She has no wheezes. She has no rales.  Abdominal: Soft. Bowel sounds are normal. She exhibits no distension and no mass. There is no tenderness.  Genitourinary:  Small (less than 1 cm) open vulvar cyst on L labia minora No erythema  Scab  Present  Scant white vaginal d/c Nl app urethra  Musculoskeletal: She exhibits tenderness. She exhibits no edema.  Tender over L thoracic and lumbar musculature  No bony tenderness Some piriformis tenderness Nl rom hips  Neg slr  Lymphadenopathy:    She has no cervical adenopathy.  Neurological: She is alert. She has normal reflexes. No cranial nerve deficit. She exhibits normal muscle tone. Coordination normal.  Skin: Skin is warm and dry. No rash noted. No erythema. No pallor.  Psychiatric: She has a normal mood and affect.          Assessment & Plan:   Problem List Items Addressed This Visit     Respiratory   ALLERGIC RHINITIS     No signs of bacterial infection  Adv to start back on antihistamine and flonase Update if not starting to improve in a week or if worsening        Musculoskeletal and Integument   Vulvar boil     Per pt hx -this is almost resolved and has already drained  Disc cleansing and use of warm compressed Update if  not starting to improve in a week or if worsening        Genitourinary   Yeast vaginitis     Will tx with diflucan  Update if not starting to improve in a week or if worsening       Relevant Medications      fluconazole (DIFLUCAN) tablet 150 mg     Other   Lumbar pain     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  Xray today and update  Disc activity recommendation/ use of heat/stretching     Relevant Orders      DG Lumbar Spine Complete (Completed)   Dysuria - Primary     ua neg  Suspect due to  vaginitis and vulvar abscess  Will update if no further improvement with tx     Relevant Orders      POCT urinalysis dipstick (Completed)   Thoracic back pain     Xray today  Muscular tenderness Disc use of heat and stretching     Relevant Orders      DG Thoracic Spine W/Swimmers (Completed)   Vaginal discharge     Will tx yeast vaginitis Also send gc/chlam cx at pt's request     Relevant Orders      POCT Wet Prep [facility]) (Completed)

## 2013-11-30 NOTE — Progress Notes (Signed)
Pre visit review using our clinic review tool, if applicable. No additional management support is needed unless otherwise documented below in the visit note. 

## 2013-11-30 NOTE — Patient Instructions (Signed)
Xray of lumbar spine and thoracic spine today- we will get back to you with a result and plan  Use heat on the painful areas 10 minutes at a time  I think your sinus pressure is from allergies-start back on zyrtec and flonase through the season Also think about quitting smoking The vaginal cyst /boil looks almost resolved - use heat on it if necessary -update if worse  We will send a STD test out  Here is a px for diflucan for yeast infection  Urine looks clear

## 2013-12-01 LAB — POCT WET PREP (WET MOUNT): KOH Wet Prep POC: POSITIVE

## 2013-12-01 LAB — GC/CHLAMYDIA PROBE AMP
CT Probe RNA: NEGATIVE
GC Probe RNA: NEGATIVE

## 2013-12-01 NOTE — Assessment & Plan Note (Signed)
Will tx with diflucan  Update if not starting to improve in a week or if worsening   

## 2013-12-01 NOTE — Assessment & Plan Note (Signed)
Xray today  Muscular tenderness Disc use of heat and stretching

## 2013-12-01 NOTE — Assessment & Plan Note (Signed)
ua neg  Suspect due to vaginitis and vulvar abscess  Will update if no further improvement with tx

## 2013-12-01 NOTE — Assessment & Plan Note (Signed)
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

## 2013-12-01 NOTE — Assessment & Plan Note (Signed)
Per pt hx -this is almost resolved and has already drained  Disc cleansing and use of warm compressed Update if not starting to improve in a week or if worsening

## 2013-12-01 NOTE — Assessment & Plan Note (Signed)
No signs of bacterial infection  Adv to start back on antihistamine and flonase Update if not starting to improve in a week or if worsening

## 2013-12-01 NOTE — Assessment & Plan Note (Signed)
Will tx yeast vaginitis Also send gc/chlam cx at pt's request

## 2013-12-12 ENCOUNTER — Telehealth: Payer: Self-pay | Admitting: Family Medicine

## 2013-12-12 DIAGNOSIS — M545 Low back pain, unspecified: Secondary | ICD-10-CM

## 2013-12-12 NOTE — Telephone Encounter (Signed)
Pt called stating she does want a referral to back dr

## 2013-12-12 NOTE — Telephone Encounter (Signed)
Referral done

## 2013-12-19 ENCOUNTER — Telehealth: Payer: Self-pay

## 2013-12-19 NOTE — Telephone Encounter (Signed)
Please check with Karina [name] re: status of referral so I know when her appt will be  I see she is allergic to percocet -this can cross react with other meds Is she taking anything otc right now?  Has she taken tramadol in the past ? Has she taken a muscle relaxer in the past?  Thanks

## 2013-12-19 NOTE — Telephone Encounter (Signed)
Pt request pain med for middle to lower back pain and lt hip pain. Pt said Rosaria Ferries is working on referral but pt said she needs med for pain now. Pain level now is 10. Back pain has worsened since saw Dr Glori Bickers on 11/30/13. [facility]. Pt request cb.

## 2013-12-20 MED ORDER — CYCLOBENZAPRINE HCL 10 MG PO TABS: 10.0000 mg | ORAL_TABLET | Freq: Three times a day (TID) | ORAL | Status: DC | PRN

## 2013-12-20 NOTE — Telephone Encounter (Signed)
Pt.notified

## 2013-12-20 NOTE — Telephone Encounter (Signed)
I sent flexeril to her pharmacy- use with care - it can sedate

## 2013-12-20 NOTE — Telephone Encounter (Addendum)
Referral still pending, Karina [name] has faxed over notes and referral and they advise her that they are about a month out for appts. So earliest appt pt might get would be mid-late Nov but no appt scheduled yet. Pt said that she doesn't remember taking tramadol in the past but she has taken Flexeril with no problems in the past, pt said that the only OTC med she is taking now is Advil, please advise

## 2014-03-06 ENCOUNTER — Telehealth: Payer: Self-pay | Admitting: Family Medicine

## 2014-03-09 IMAGING — CR DG CHEST 2V
2 series · 2 of 2 positions shown · non-contrast
Comparison: Chest x-ray of 10/14/2011

CLINICAL DATA: Cough.

CHEST - 2 VIEW

[view not recorded (1 of 2)]
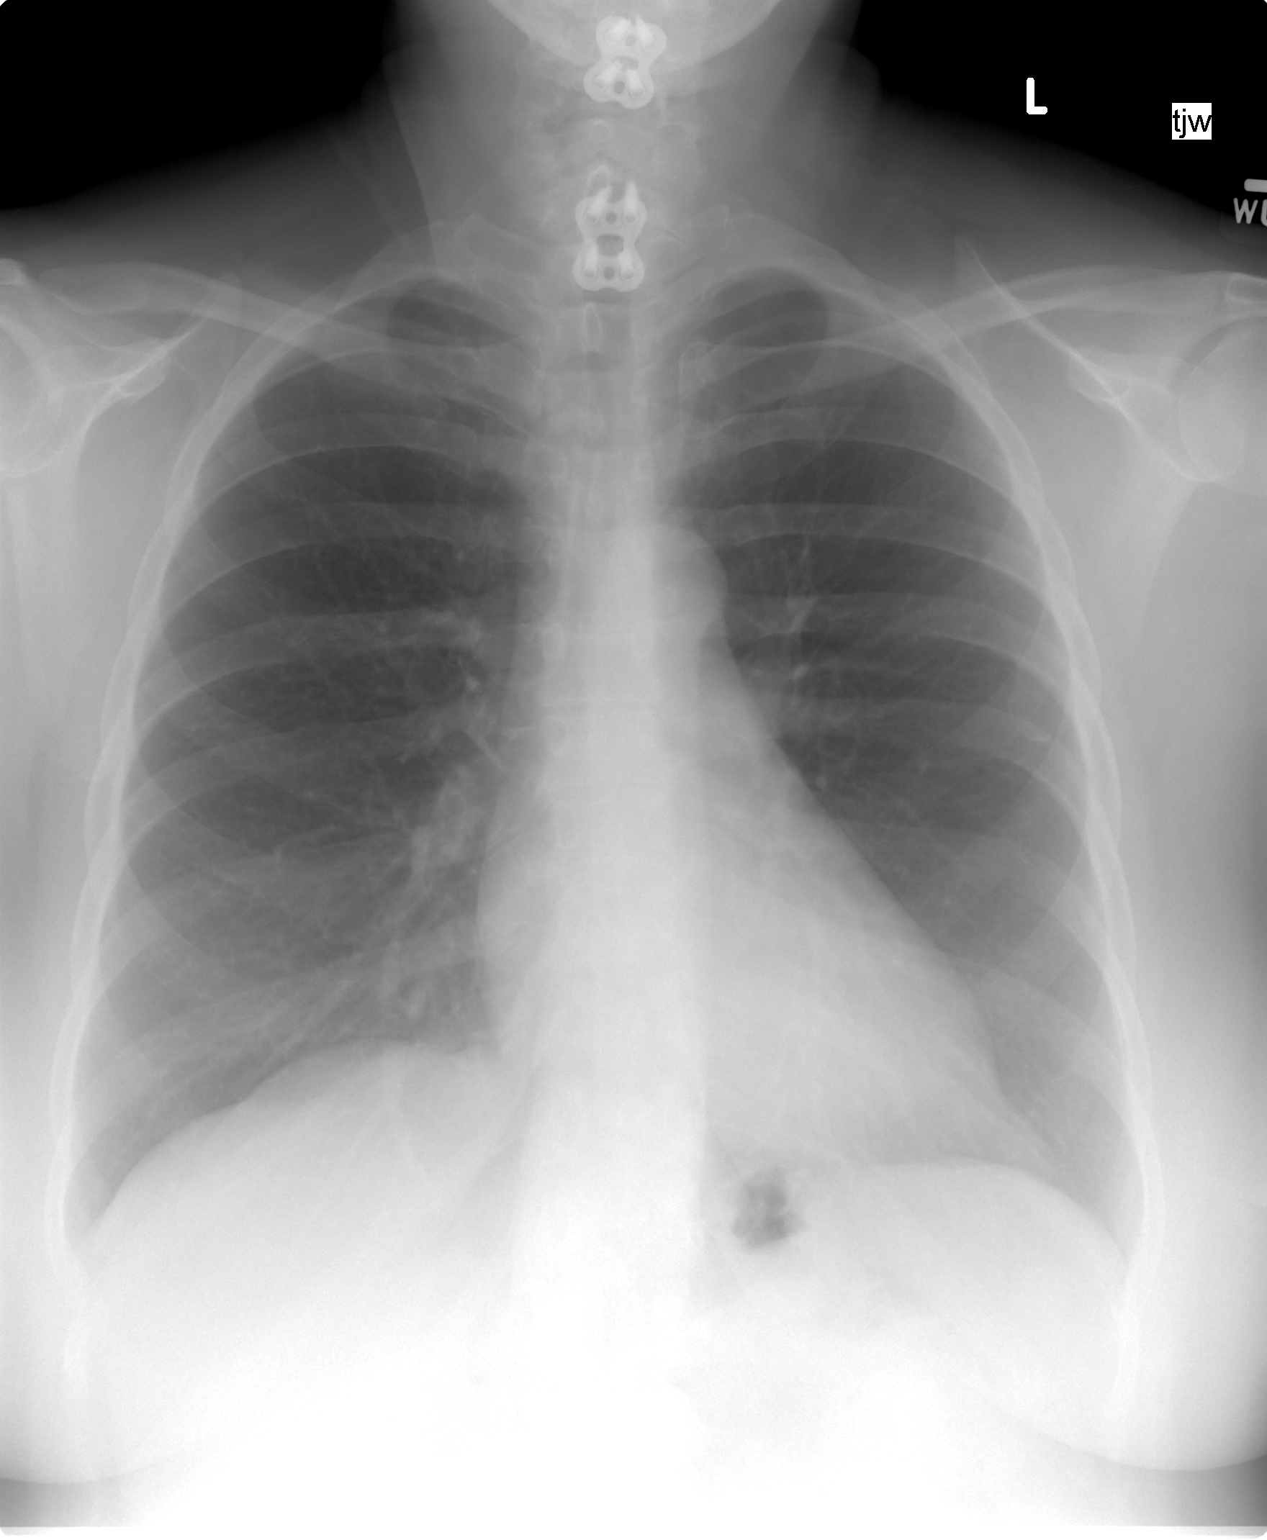

[view not recorded (2 of 2)]
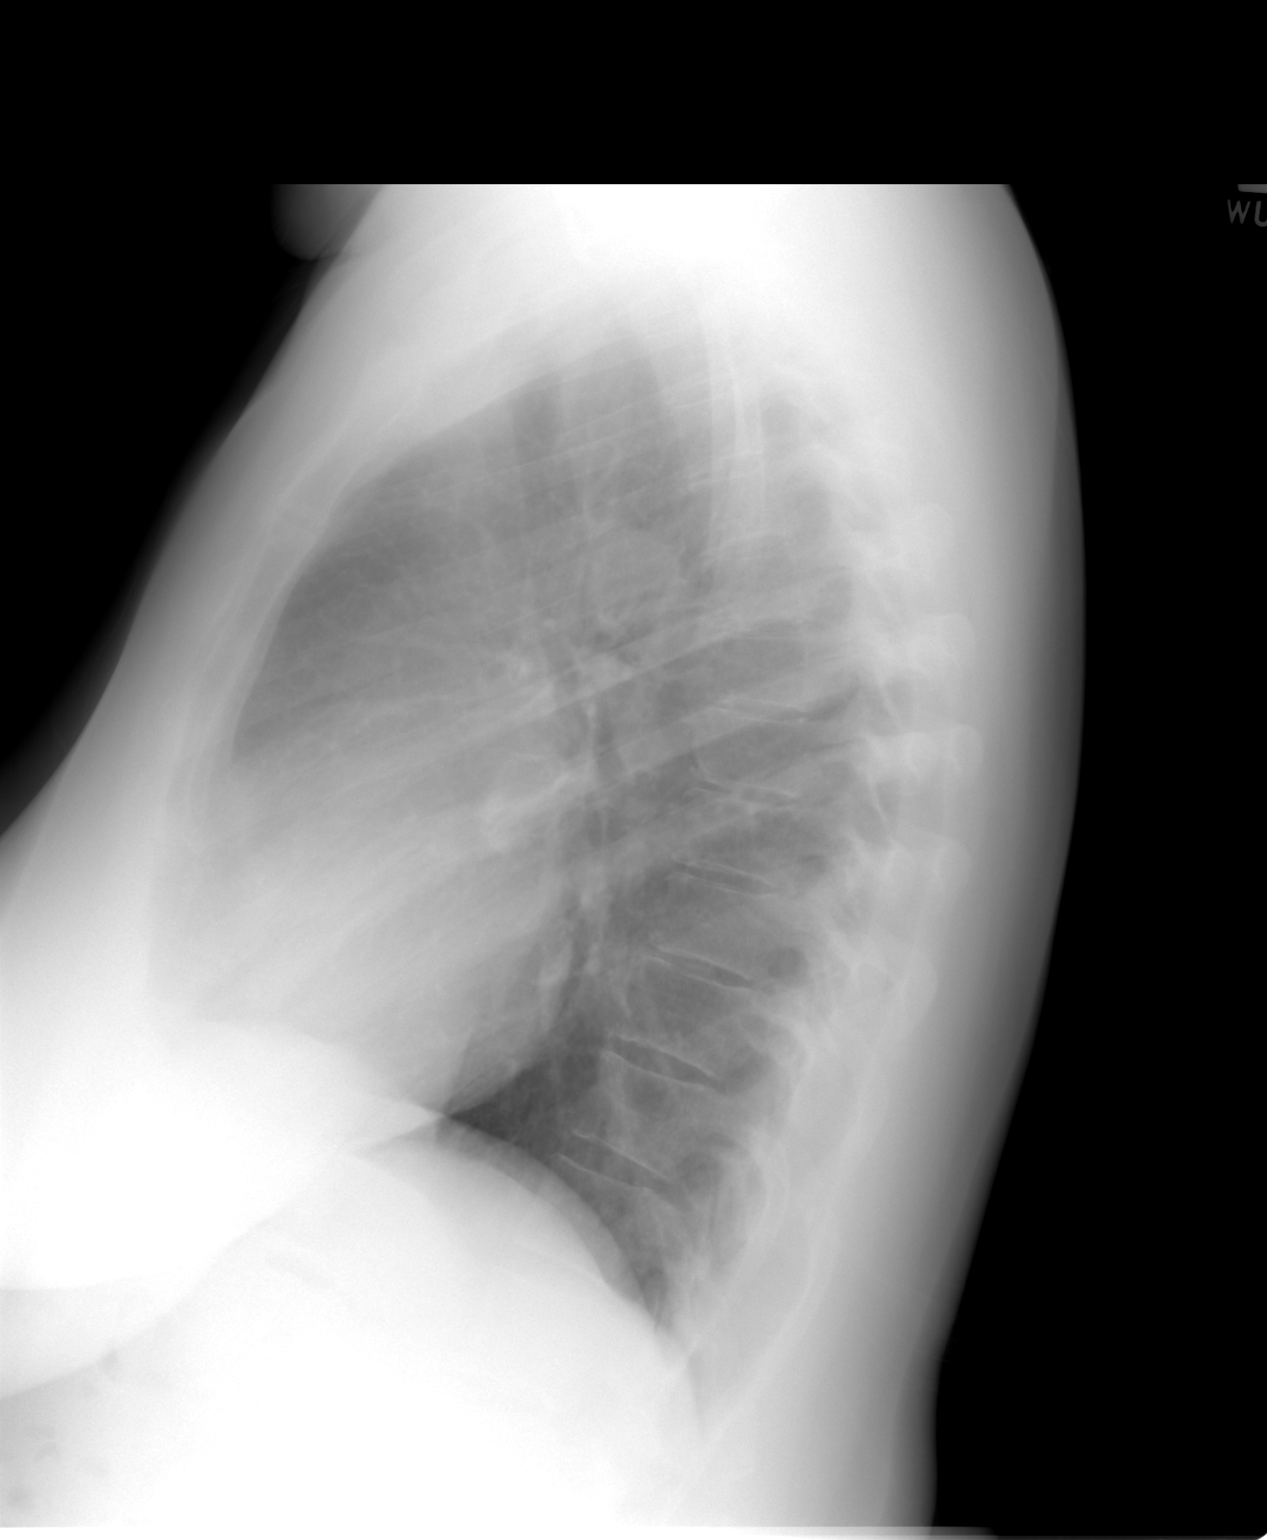

[2 of 2 positions shown; findings below may reference images not displayed]

FINDINGS: The lungs are clear.  Mediastinal contours appear stable.
The heart is borderline enlarged and stable.  No acute bony
abnormalities seen.  Mid and lower cervical spine fusion plates are
present
IMPRESSION: Stable chest x-ray with borderline cardiomegaly.  No active lung
disease.}

## 2014-03-11 ENCOUNTER — Ambulatory Visit (INDEPENDENT_AMBULATORY_CARE_PROVIDER_SITE_OTHER): Payer: Medicare HMO | Admitting: Family Medicine

## 2014-03-11 ENCOUNTER — Telehealth: Payer: Self-pay | Admitting: Family Medicine

## 2014-03-11 ENCOUNTER — Ambulatory Visit (INDEPENDENT_AMBULATORY_CARE_PROVIDER_SITE_OTHER)
Admission: RE | Admit: 2014-03-11 | Discharge: 2014-03-11 | Disposition: A | Discharge status: Home / Self Care | Payer: Medicare HMO | Source: Ambulatory Visit | Attending: Family Medicine | Admitting: Family Medicine

## 2014-03-11 ENCOUNTER — Encounter: Payer: Self-pay | Admitting: Family Medicine

## 2014-03-11 ENCOUNTER — Ambulatory Visit
Admission: RE | Admit: 2014-03-11 | Discharge: 2014-03-11 | Disposition: A | Discharge status: Home / Self Care | Payer: Medicare HMO | Source: Ambulatory Visit | Attending: Family Medicine | Admitting: Family Medicine

## 2014-03-11 VITALS — BP 122/78 | HR 75 | Temp 97.9°F | Ht 61.5 in | Wt 181.5 lb

## 2014-03-11 DIAGNOSIS — M25561 Pain in right knee: Secondary | ICD-10-CM

## 2014-03-11 DIAGNOSIS — J309 Allergic rhinitis, unspecified: Secondary | ICD-10-CM

## 2014-03-11 DIAGNOSIS — M25562 Pain in left knee: Principal | ICD-10-CM

## 2014-03-11 DIAGNOSIS — Z23 Encounter for immunization: Secondary | ICD-10-CM

## 2014-03-11 NOTE — Assessment & Plan Note (Signed)
Swollen turbinate vs nasal polyp in R nare Will continue flonase Also abx ointment topically if sore on the inside Update if no imp -would consider ENT opinion

## 2014-03-11 NOTE — Patient Instructions (Signed)
Use knee braces as needed  When resting - use ice / cold compress on knees for 10 minutes at a time and elevate legs  We will do some xrays today and then make a plan   If you take advil- take with food   For nose and allergies- continue flonase Also try over the counter antibiotic ointment - with a q tip to inside of nose as needed  Let me know if symptoms do not improved

## 2014-03-11 NOTE — Progress Notes (Signed)
Pre visit review using our clinic review tool, if applicable. No additional management support is needed unless otherwise documented below in the visit note. 
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
## 2014-03-11 NOTE — Telephone Encounter (Signed)
Please let pt know I released her xray reports on my chart- little to no arthritis I recommend she see Dr Lorelei Pont for further eval of knee pain

## 2014-03-11 NOTE — Assessment & Plan Note (Signed)
Tender over joint lines and posteriorly  Some crepitus Suspect early OA vs overuse/obesity rel strain Adv to continue use of soft braces for support as needed  advil with food as tol Cold compress Avoid high impact exercise xrays today

## 2014-03-12 NOTE — Telephone Encounter (Signed)
Pt notified of xray results and Dr. Marliss Coots comments. appt scheduled with Dr. Lorelei Pont

## 2014-03-14 ENCOUNTER — Ambulatory Visit (INDEPENDENT_AMBULATORY_CARE_PROVIDER_SITE_OTHER): Payer: Medicare HMO | Admitting: Family Medicine

## 2014-03-14 ENCOUNTER — Encounter: Payer: Self-pay | Admitting: Family Medicine

## 2014-03-14 VITALS — BP 106/72 | HR 81 | Temp 98.5°F | Ht 61.5 in | Wt 183.0 lb

## 2014-03-14 DIAGNOSIS — M25562 Pain in left knee: Secondary | ICD-10-CM

## 2014-03-14 DIAGNOSIS — M170 Bilateral primary osteoarthritis of knee: Secondary | ICD-10-CM

## 2014-03-14 DIAGNOSIS — M25561 Pain in right knee: Secondary | ICD-10-CM

## 2014-03-14 MED ORDER — MELOXICAM 15 MG PO TABS: 15.0000 mg | ORAL_TABLET | Freq: Every day | ORAL | Status: DC

## 2014-03-14 MED ORDER — TRAMADOL HCL 50 MG PO TABS: 50.0000 mg | ORAL_TABLET | Freq: Three times a day (TID) | ORAL | Status: DC | PRN

## 2014-03-14 NOTE — Progress Notes (Signed)
Pre visit review using our clinic review tool, if applicable. No additional management support is needed unless otherwise documented below in the visit note. 

## 2014-03-14 NOTE — Patient Instructions (Signed)
OSTEOARTHRITIS:  For symptomatic relief: Tylenol: 2 tablets up to 3-4 times a day Regular NSAIDS are helpful (avoid in kidney disease and ulcers) Topical Capzaicin Cream, as needed (wear glove to put on)  For flares, corticosteroid injections help. Hyaluronic Acid injections have good success, average relief is 6 months  Glucosamine and Chondroitin often helpful - will take about 3 months to see if you have an effect. If you do, great, keep them up, if none at that point, no need to take in the future.  Omega-3 fish oils may help, 2 grams daily Ice joints on bad days, 20 min, 2-3 x / day REGULAR EXERCISE: swimming, Yoga, Tai Chi, bicycle (NON-IMPACT activity)
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
## 2014-03-15 ENCOUNTER — Telehealth: Payer: Self-pay | Admitting: Family Medicine

## 2014-03-15 NOTE — Telephone Encounter (Signed)
emmi mailed  °

## 2014-03-19 NOTE — Telephone Encounter (Signed)
Error

## 2014-04-30 ENCOUNTER — Other Ambulatory Visit: Payer: Self-pay | Admitting: Family Medicine

## 2014-04-30 DIAGNOSIS — N6320 Unspecified lump in the left breast, unspecified quadrant: Secondary | ICD-10-CM

## 2014-05-03 ENCOUNTER — Other Ambulatory Visit: Payer: Self-pay | Admitting: Neurology

## 2014-05-08 ENCOUNTER — Other Ambulatory Visit: Payer: Self-pay | Admitting: Neurology

## 2014-05-08 ENCOUNTER — Telehealth: Payer: Self-pay | Admitting: *Deleted

## 2014-05-08 NOTE — Telephone Encounter (Signed)
Ins has been contacted and provided with all clinical info.  Request is currently under review.

## 2014-05-08 NOTE — Telephone Encounter (Signed)
I called back.  Provided additional info.  Request is under review Ref Case # [id number]

## 2014-05-08 NOTE — Telephone Encounter (Signed)
Talbert Nan with [facility] determination @ [phone], stated Rx BETASEROM 0.3 mg is a Non Formulary Rx.  Please call and advise.

## 2014-05-08 NOTE — Telephone Encounter (Signed)
The insurance is needing prior auth for betaserom  the number to call is [phone]

## 2014-05-09 NOTE — Telephone Encounter (Signed)
I contacted ins and verified info.

## 2014-05-09 NOTE — Telephone Encounter (Signed)
Pat with [facility] is calling back because she has questions about the name of the medication. Is the medication Betaseron or Avonex. Please call and advise. Thank you.

## 2014-05-10 ENCOUNTER — Ambulatory Visit: Payer: Medicare HMO | Admitting: Neurology

## 2014-05-10 ENCOUNTER — Ambulatory Visit: Payer: Medicare HMO | Admitting: Nurse Practitioner

## 2014-05-14 ENCOUNTER — Ambulatory Visit (INDEPENDENT_AMBULATORY_CARE_PROVIDER_SITE_OTHER): Payer: Medicare HMO | Admitting: Adult Health

## 2014-05-14 ENCOUNTER — Encounter: Payer: Self-pay | Admitting: Adult Health

## 2014-05-14 ENCOUNTER — Ambulatory Visit (INDEPENDENT_AMBULATORY_CARE_PROVIDER_SITE_OTHER)
Admission: RE | Admit: 2014-05-14 | Discharge: 2014-05-14 | Disposition: A | Discharge status: Home / Self Care | Payer: Medicare HMO | Source: Ambulatory Visit | Attending: Adult Health | Admitting: Adult Health

## 2014-05-14 VITALS — BP 126/90 | HR 61 | Temp 97.9°F | Ht 61.0 in | Wt 188.0 lb

## 2014-05-14 DIAGNOSIS — R053 Chronic cough: Secondary | ICD-10-CM

## 2014-05-14 DIAGNOSIS — R05 Cough: Secondary | ICD-10-CM

## 2014-05-14 DIAGNOSIS — J209 Acute bronchitis, unspecified: Secondary | ICD-10-CM

## 2014-05-14 DIAGNOSIS — R0600 Dyspnea, unspecified: Secondary | ICD-10-CM

## 2014-05-14 MED ORDER — PREDNISONE 10 MG PO TABS: ORAL_TABLET | ORAL | Status: DC

## 2014-05-14 MED ORDER — LEVALBUTEROL HCL 0.63 MG/3ML IN NEBU
0.6300 mg | INHALATION_SOLUTION | Freq: Once | RESPIRATORY_TRACT | Status: DC
Start: 2014-05-14 — End: 2014-05-14

## 2014-05-14 MED ORDER — CEFDINIR 300 MG PO CAPS: 300.0000 mg | ORAL_CAPSULE | Freq: Two times a day (BID) | ORAL | Status: DC

## 2014-05-14 NOTE — Assessment & Plan Note (Signed)
Flare  Smoking cessation  cxr without PNA   Plan  Omnicef 300mg  Twice daily   For 7 days  Mucinex DM Twice daily  As needed  Cough/congestion  Prednisone taper over next week.  Fluids and rest  Please contact office for sooner follow up if symptoms do not improve or worsen or seek emergency care  Follow up Dr. Gwenette Greet in 6 weeks and As needed

## 2014-05-14 NOTE — Patient Instructions (Addendum)
Omnicef 300mg  Twice daily   For 7 days  Mucinex DM Twice daily  As needed  Cough/congestion  Prednisone taper over next week.  Fluids and rest  Please contact office for sooner follow up if symptoms do not improve or worsen or seek emergency care  Follow up Dr. Gwenette Greet in 6 weeks and As needed

## 2014-05-14 NOTE — Progress Notes (Signed)
   Subjective:    Patient ID: Karina [name], female    DOB: 09-07-1955, 59 y.o.   MRN: [id number]  HPI  59 yo female smoker with minimal airflow obstruction on PFT  Has MS .   05/14/2014 Acute OV  Patient presents for an acute office visit. She complains over the last several weeks that her cough has been getting worse with thick, sticky mucus, nasal congestion, drainage, sinus pressure, wheezing and shortness of breath. She's been using over-the-counter medications without much relief. She is on Symbicort 2 puffs twice daily. She denies any hemoptysis, orthopnea, PND or leg swelling , or fever Not seen since 2013. Discussed smoking cessation.     Review of Systems Constitutional:   No  weight loss, night sweats,  Fevers, chills,  +fatigue, or  lassitude.  HEENT:   No headaches,  Difficulty swallowing,  Tooth/dental problems, or  Sore throat,                No sneezing, itching, ear ache, + nasal congestion, post nasal drip,   CV:  No chest pain,  Orthopnea, PND, swelling in lower extremities, anasarca, dizziness, palpitations, syncope.   GI  No heartburn, indigestion, abdominal pain, nausea, vomiting, diarrhea, change in bowel habits, loss of appetite, bloody stools.   Resp: .  No chest wall deformity  Skin: no rash or lesions.  GU: no dysuria, change in color of urine, no urgency or frequency.  No flank pain, no hematuria   MS:  No joint pain or swelling.  No decreased range of motion.  No back pain.  Psych:  No change in mood or affect. No depression or anxiety.  No memory loss.         Objective:   Physical Exam GEN: A/Ox3; pleasant , NAD, well nourished   HEENT:  [place]/AT,  EACs-clear, TMs-wnl, NOSE-clear drainage  THROAT-clear, no lesions, no postnasal drip or exudate noted.   NECK:  Supple w/ fair ROM; no JVD; normal carotid impulses w/o bruits; no thyromegaly or nodules palpated; no lymphadenopathy.  RESP  Scattered rhonchi and exp wheezing no accessory  muscle use, no dullness to percussion  CARD:  RRR, no m/r/g  , no peripheral edema, pulses intact, no cyanosis or clubbing.  GI:   Soft & nt; nml bowel sounds; no organomegaly or masses detected.  Musco: Warm bil, no deformities or joint swelling noted.   Neuro: alert, no focal deficits noted.    Skin: Warm, no lesions or rashes  CXR 05/14/2014 , no PNA  Bronchitic changes  Independently reviewed       Assessment & Plan:

## 2014-05-15 NOTE — Progress Notes (Signed)
Ov reviewed, and agree with plan as outlined.  

## 2014-05-17 ENCOUNTER — Ambulatory Visit (INDEPENDENT_AMBULATORY_CARE_PROVIDER_SITE_OTHER): Payer: Medicare HMO | Admitting: Adult Health

## 2014-05-17 ENCOUNTER — Encounter: Payer: Self-pay | Admitting: Adult Health

## 2014-05-17 ENCOUNTER — Telehealth: Payer: Self-pay

## 2014-05-17 VITALS — BP 118/72 | HR 68 | Ht 61.0 in | Wt 188.0 lb

## 2014-05-17 DIAGNOSIS — G35 Multiple sclerosis: Secondary | ICD-10-CM

## 2014-05-17 DIAGNOSIS — R350 Frequency of micturition: Secondary | ICD-10-CM

## 2014-05-17 NOTE — Progress Notes (Signed)
I have read the note, and I agree with the clinical assessment and plan.  WILLIS,CHARLES KEITH   

## 2014-05-17 NOTE — Telephone Encounter (Signed)
Karina [name] has approved the request for coverage on Betaseron effective until 03/08/2015.

## 2014-05-17 NOTE — Patient Instructions (Signed)
I will check on the status of Betaseron If you have any new symptoms please let us know immediately.

## 2014-05-17 NOTE — Progress Notes (Addendum)
PATIENT: Karina [name] DOB: Sep 21, 1955  REASON FOR VISIT: follow up- multiple sclerosis, urinary frequency HISTORY FROM: patient  HISTORY OF PRESENT ILLNESS [name] is a 59 year old female with a history of multiple sclerosis. She returns today for follow-up. She is currently in the process of starting Betaseron. She is waiting to hear back from the insurance company. She states that she did not want to start Gilenya because she was concerned about the side effects. The patient denies any new weakness. She does have intermittent numbness on the left side of the body. She states the numbness mainly affects the left arm and the left groin area. She denies any changes with her gait or balance. Denies any falls. She does have a history of urinary frequency. She has had one episode of bowel incontinence but states it was very little amount. The patient does have some blurring of her vision has been going on for the last 1-2 months. She states that she was diagnosed with cataracts when she saw her ophthalmologist last year. She has not followed back up with her ophthalmologist. Patient does complain of left hip pain. She is followed by " back MD" she is unsure of his name. She states that he sent her to physical therapy but she stopped due to pain. She has not followed back up with her doctor. The patient does report some changes with her memory. She states that she will have "blanking episodes." She states that she can walk into the kitchen and will forget where she came and therefore however after a few minutes she usually can recall it. She denies any new medical issues. She returns today for an evaluation.  HISTORY 05/09/13 (WILLIS): [name] is a 59 year old right-handed white female with a history of multiple sclerosis initially diagnosed in 29. The patient indicated that the diagnosis came to light following several syncopal episodes. The patient underwent a MRI of the brain, and she was found to  have lesions consistent with MS. The patient indicates that she had a lumbar puncture, and the diagnosis of multiple sclerosis was confirmed. The patient has been on Betaseron for number of years, but she developed calcifications at the sites of injection. The patient was given a trial on Tecfidera, but she could not tolerate this. The patient has been on no medications for her multiple sclerosis in over 2 years mainly secondary to problems with her insurance, and inability to afford medications even with patient assistance. The patient has had some recent issues with depression, and she has become more withdrawn. The patient last had MRI evaluation of the brain on 07/21/2011, and no enhancing lesions were noted at that time. The patient has not had any new numbness or weakness of the extremities, vision changes, but she does have some ongoing issues with urinary incontinence. The patient denies any significant balance issues or problems with falling. The patient comes to this office for an evaluation.   REVIEW OF SYSTEMS: Out of a complete 14 system review of symptoms, the patient complains only of the following symptoms, and all other reviewed systems are negative.  Fatigue, ear pain, eye discharge, eye itching, blurred vision, cough, wheezing, shortness of breath, swollen abdomen, rectal pain, restless leg, insomnia, frequent infections, incontinence of bladder, joint pain, joint swelling, back pain, memory loss, dizziness, numbness  ALLERGIES: Allergies  Allergen Reactions  . Percocet [Oxycodone-Acetaminophen] Shortness Of Breath    Felt like going to pass out and sweating  . Tecfidera [Dimethyl Fumarate] Shortness  Of Breath, Palpitations, Rash and Cough  . Amitriptyline Hcl     REACTION: swelling and itching  . Atorvastatin     REACTION: elevated LFT's  . Clarithromycin     REACTION: reaction not known  . Duloxetine     REACTION: pain  . Levofloxacin     REACTION: Rash  . Pregabalin      REACTION: LE swelling  . Pseudoephedrine     REACTION: legs hurt  . Zoloft [Sertraline Hcl] Other (See Comments)    Headaches     HOME MEDICATIONS: Outpatient Prescriptions Prior to Visit  Medication Sig Dispense Refill  . ALPRAZolam (XANAX) 0.5 MG tablet Take 0.5 mg by mouth 2 (two) times daily as needed. For anxiety.    . budesonide-formoterol (SYMBICORT) 160-4.5 MCG/ACT inhaler Inhale 2 puffs into the lungs 2 (two) times daily. 1 Inhaler 11  . cefdinir (OMNICEF) 300 MG capsule Take 1 capsule (300 mg total) by mouth 2 (two) times daily. 14 capsule 0  . CRESTOR 20 MG tablet TAKE 1/2 TABLET BY MOUTH DAILY 15 tablet 2  . cyclobenzaprine (FLEXERIL) 10 MG tablet Take 1 tablet (10 mg total) by mouth 3 (three) times daily as needed for muscle spasms (watch out for sedation). 30 tablet 1  . fluticasone (FLONASE) 50 MCG/ACT nasal spray Place 2 sprays into both nostrils daily.    . furosemide (LASIX) 20 MG tablet Take 20 mg by mouth daily.    Marland Kitchen gabapentin (NEURONTIN) 300 MG capsule TAKE 2 CAPSULES (600 MG TOTAL) BY MOUTH AT BEDTIME. 60 capsule 11  . predniSONE (DELTASONE) 10 MG tablet 4 tabs for 2 days, then 3 tabs for 2 days, 2 tabs for 2 days, then 1 tab for 2 days, then stop 20 tablet 0  . Probiotic Product (ALIGN PO) Take by mouth daily.    . VENTOLIN HFA 108 (90 BASE) MCG/ACT inhaler INHALE 2 PUFFS UP TO EVERY 4 HOURS AS NEEDED FOR WHEEZING 1 Inhaler 11  . meloxicam (MOBIC) 15 MG tablet Take 1 tablet (15 mg total) by mouth daily. (Patient not taking: Reported on 05/17/2014) 30 tablet 5  . traMADol (ULTRAM) 50 MG tablet Take 1 tablet (50 mg total) by mouth every 8 (eight) hours as needed. (Patient not taking: Reported on 05/17/2014) 40 tablet 2   No facility-administered medications prior to visit.    PAST MEDICAL HISTORY: Past Medical History  Diagnosis Date  . Vertigo   . GERD (gastroesophageal reflux disease)   . Seizure disorder     single seizure/hypoglycemia  . Urinary  incontinence   . Plantar fasciitis   . Colon polyps 08/2008    Adenomatous  . Gastritis 08/2008    H. pylori  . Lung nodule   . Menopausal disorder   . Multiple sclerosis   . Allergic rhinitis   . Hypercholesteremia   . Diabetes mellitus   . COPD (chronic obstructive pulmonary disease)   . History of shingles     Left V1  . Allergy   . Anxiety     [facility]- [facility] [place], no per pt  . Depression     no per pt  . Cataract   . Neuromuscular disorder     Multiple sclerosis  . Seizures     1 seizure in 2001 due to blood sugar dropping to 38, none since    PAST SURGICAL HISTORY: Past Surgical History  Procedure Laterality Date  . Cervical discectomy  2004    x 2, Anterior; Fusion C4-5, C5-6,  C6-7, Dr. Kary Kos  . Tubal ligation    . Dilation and curettage of uterus    . Tonsillectomy    . Foot surgery      left plantar fascial problem  . Cholecystectomy    . Chest ct  11/2008    With small 4 mm nodule L lung base (rec re check in 1 year)  . Chest ct  07/2009    Re check chest CT - lung nodule stable  . Ct sinus ltd w/o cm  02/2009    negative  . Colonoscopy  08/2008    Polyps/ re check 5 yrs  . Esophagogastroduodenoscopy  08/2008    Erosive gastritis, h pylori (treated)  . Rotator cuff repair      right  . Upper gastrointestinal endoscopy      FAMILY HISTORY: Family History  Problem Relation Age of Onset  . Migraines Mother   . Heart attack Mother   . Coronary artery disease Mother   . Coronary artery disease Father     6 bypasses   . Stroke Father   . Diabetes Father   . Renal Disease Father   . Colon cancer Maternal Grandfather 44  . Coronary artery disease Cousin   . Coronary artery disease Paternal Grandmother   . Lung cancer Paternal Aunt   . Multiple sclerosis Neg Hx   . Esophageal cancer Neg Hx   . Rectal cancer Neg Hx   . Stomach cancer Neg Hx     SOCIAL HISTORY: History   Social History  . Marital Status: Married    Spouse Name: N/A    . Number of Children: 3  . Years of Education: 9 th   Occupational History  . Disabled     Social History Main Topics  . Smoking status: Heavy Tobacco Smoker -- 1.50 packs/day for 30 years    Types: Cigarettes  . Smokeless tobacco: Never Used  . Alcohol Use: No  . Drug Use: No  . Sexual Activity: Not on file   Other Topics Concern  . Not on file   Social History Narrative   Married with 3 children   Daily caffeine use - 2    Disabled.   Education 9th grade    Caffeine one cup of coffee daily.      PHYSICAL EXAM  Filed Vitals:   05/17/14 1016  BP: 118/72  Pulse: 68  Height: 5\' 1"  (1.549 m)  Weight: 188 lb (85.276 kg)   Body mass index is 35.54 kg/(m^2).  Generalized: Well developed, in no acute distress   Neurological examination  Mentation: Alert oriented to time, place, history taking. Follows all commands speech and language fluent. MMSE 30/30 Cranial nerve II-XII: Pupils were equal round reactive to light. Extraocular movements were full, visual field were full on confrontational test. Facial sensation and strength were normal. Uvula tongue midline. Head turning and shoulder shrug  were normal and symmetric. Motor: The motor testing reveals 5 over 5 strength of all 4 extremities. Good symmetric motor tone is noted throughout.  Sensory: Sensory testing is intact to soft touch on all 4 extremities. No evidence of extinction is noted.  Coordination: Cerebellar testing reveals good finger-nose-finger and heel-to-shin bilaterally.  Gait and station: Gait is normal. Tandem gait is normal. Romberg is negative. No drift is seen.  Reflexes: Deep tendon reflexes are symmetric and normal bilaterally.    DIAGNOSTIC DATA (LABS, IMAGING, TESTING) - I reviewed patient records, labs, notes, testing and imaging myself  where available.      ASSESSMENT AND PLAN 59 y.o. year old female  has a past medical history of Vertigo; GERD (gastroesophageal reflux disease); Seizure  disorder; Urinary incontinence; Plantar fasciitis; Colon polyps (08/2008); Gastritis (08/2008); Lung nodule; Menopausal disorder; Multiple sclerosis; Allergic rhinitis; Hypercholesteremia; Diabetes mellitus; COPD (chronic obstructive pulmonary disease); History of shingles; Allergy; Anxiety; Depression; Cataract; Neuromuscular disorder; and Seizures. here with:  1. Multiple sclerosis 2. Urinary frequency  Overall the patient has remained stable. She states that she is in the process of starting Betaseron. She is waiting on insurance to fax over paperwork. I will have our pharmacy technician check on this as well. The patient had an MRI last year which showed new lesions in the temporal region. I explained to the patient how important it was for her to start on disease modifying therapy as soon as possible. She verbalized understanding. I advised the patient to follow-up with her spine doctor regarding her ongoing hip pain that he's been treating her for. Verbalized understanding. Also recommended that she follow up with her ophthalmologist. She will return to this office in 6 months or sooner if needed.    Ward Givens, MSN, NP-C 05/17/2014, 10:43 AM [facility] [address] [place], [place] [address] [phone]  Note: This document was prepared with digital dictation and possible smart phrase technology. Any transcriptional errors that result from this process are unintentional.

## 2014-05-28 ENCOUNTER — Telehealth: Payer: Self-pay | Admitting: Neurology

## 2014-05-28 NOTE — Telephone Encounter (Signed)
Info was already faxed to them on 03/17 at [phone].  I have re-faxed it.  I called back.  Spoke with [employer].  He has noted file.

## 2014-05-28 NOTE — Telephone Encounter (Signed)
[employer] is calling to have the authorization form for [employer] faxed back to them. She is refaxing the form to [phone] today and prior to this faxed the form to [phone]. Please call.

## 2014-05-28 NOTE — Telephone Encounter (Signed)
I am sending this to you

## 2014-05-30 ENCOUNTER — Ambulatory Visit
Admission: RE | Admit: 2014-05-30 | Discharge: 2014-05-30 | Disposition: A | Discharge status: Home / Self Care | Payer: Medicare HMO | Source: Ambulatory Visit | Attending: Family Medicine | Admitting: Family Medicine

## 2014-05-30 DIAGNOSIS — N6320 Unspecified lump in the left breast, unspecified quadrant: Secondary | ICD-10-CM

## 2014-06-25 ENCOUNTER — Ambulatory Visit: Payer: Medicare HMO | Admitting: Pulmonary Disease

## 2014-10-01 NOTE — Telephone Encounter (Signed)
ERROR

## 2014-11-01 ENCOUNTER — Encounter: Payer: Self-pay | Admitting: Family Medicine

## 2014-11-01 ENCOUNTER — Ambulatory Visit (INDEPENDENT_AMBULATORY_CARE_PROVIDER_SITE_OTHER): Payer: Medicare HMO | Admitting: Family Medicine

## 2014-11-01 VITALS — BP 118/66 | HR 78 | Temp 98.1°F | Ht 61.0 in | Wt 179.5 lb

## 2014-11-01 DIAGNOSIS — B373 Candidiasis of vulva and vagina: Secondary | ICD-10-CM

## 2014-11-01 DIAGNOSIS — M25552 Pain in left hip: Secondary | ICD-10-CM | POA: Diagnosis not present

## 2014-11-01 DIAGNOSIS — M545 Low back pain: Secondary | ICD-10-CM | POA: Diagnosis not present

## 2014-11-01 DIAGNOSIS — M519 Unspecified thoracic, thoracolumbar and lumbosacral intervertebral disc disorder: Secondary | ICD-10-CM | POA: Diagnosis not present

## 2014-11-01 DIAGNOSIS — B370 Candidal stomatitis: Secondary | ICD-10-CM

## 2014-11-01 DIAGNOSIS — K219 Gastro-esophageal reflux disease without esophagitis: Secondary | ICD-10-CM

## 2014-11-01 DIAGNOSIS — G35 Multiple sclerosis: Secondary | ICD-10-CM

## 2014-11-01 DIAGNOSIS — B3731 Acute candidiasis of vulva and vagina: Secondary | ICD-10-CM

## 2014-11-01 LAB — POCT URINALYSIS DIPSTICK
Bilirubin, UA: NEGATIVE
Blood, UA: NEGATIVE
Glucose, UA: NEGATIVE
Ketones, UA: NEGATIVE
Leukocytes, UA: NEGATIVE
Nitrite, UA: NEGATIVE
Protein, UA: NEGATIVE
Spec Grav, UA: 1.025
Urobilinogen, UA: 0.2
pH, UA: 6

## 2014-11-01 MED ORDER — NYSTATIN 100000 UNIT/ML MT SUSP: 5.0000 mL | Freq: Three times a day (TID) | OROMUCOSAL | Status: DC

## 2014-11-01 MED ORDER — FLUCONAZOLE 150 MG PO TABS: 150.0000 mg | ORAL_TABLET | Freq: Once | ORAL | Status: DC

## 2014-11-01 NOTE — Progress Notes (Signed)
Subjective:    Patient ID: Karina [name], female    DOB: 1956/03/03, 59 y.o.   MRN: [id number]  HPI Here with multiple c/o   Urine odor  No burning when she urinates Results for orders placed or performed in visit on 11/01/14  Urinalysis Dipstick  Result Value Ref Range   Color, UA Yellow    Clarity, UA Clear    Glucose, UA Neg.    Bilirubin, UA Neg.    Ketones, UA Neg.    Spec Grav, UA 1.025    Blood, UA Neg.    pH, UA 6.0    Protein, UA Neg.    Urobilinogen, UA 0.2    Nitrite, UA Neg.    Leukocytes, UA Negative Negative    Some vaginal itch -more on the outside  Used monistat No discharge   Low back pain  In the middle  Runs down to her hip on R side  Runs down her leg as well  Some numbness in R flank area   Sees neuro for MS  The beta s treatment is making her worse/headache and fatigue   Has seen a back specialist - Dr Elease Etienne to PT -and that made it worse - did not go back (also TENS)  Tongue feels bad - hurts and burns and looks like it has "splits" in it  Mouth feels slimy  Uses symbicort  This stops her from putting her teeth in   Some burning stomach pain occasionally and also heartburn ? With specific foods  Has not tried antacids  No n/v  Patient Active Problem List   Diagnosis Date Noted  . Oral thrush 11/01/2014  . Lumbar disc disease 11/01/2014  . Pain in left hip 11/01/2014  . Knee pain, bilateral 03/11/2014  . Yeast vaginitis 11/30/2013  . Thoracic back pain 11/30/2013  . Vaginal discharge 11/30/2013  . Vulvar boil 11/30/2013  . Infectious mononucleosis 07/11/2013  . Bronchitis, acute, with bronchospasm 07/11/2013  . Encounter for routine gynecological examination 01/31/2013  . Dysuria 01/31/2013  . Encounter for Medicare annual wellness exam 01/23/2013  . Contusion of lower leg, left 10/04/2012  . Chest pain 09/20/2012  . SOB (shortness of breath) 09/20/2012  . Dyspnea 09/13/2012  . Asthmatic bronchitis , chronic  12/06/2011  . Chronic cough 11/22/2011  . OTHER DISORDERS OF EYELID 04/02/2010  . ANXIETY, SITUATIONAL 07/17/2009  . Lumbar pain 12/27/2008  . HEMORRHOIDS-EXTERNAL 11/07/2008  . IRRITABLE BOWEL SYNDROME 11/07/2008  . LUNG NODULE 10/14/2008  . HYPERGLYCEMIA, BORDERLINE 10/14/2008  . PERSONAL HX COLONIC POLYPS 10/08/2008  . UNSPECIFIED VITAMIN D DEFICIENCY 07/23/2008  . SYNCOPE, HX OF 06/01/2008  . FOOT SURGERY, HX OF 06/01/2008  . DILATION AND CURETTAGE, HX OF 06/01/2008  . OSTEOARTHRITIS, SHOULDER, RIGHT 03/22/2008  . ROTATOR CUFF SYNDROME, RIGHT 03/22/2008  . ARTHRALGIA 12/12/2007  . Allergic rhinitis 08/01/2007  . Smoker 10/17/2006  . NEUROPATHY-PERIPHERAL 10/17/2006  . FOOT PAIN, BILATERAL 10/17/2006  . SYMPTOM, EDEMA 10/03/2006  . HYPOGLYCEMIA 09/30/2006  . HYPERCHOLESTEROLEMIA 09/30/2006  . MULTIPLE SCLEROSIS 09/30/2006  . GERD 09/30/2006  . FIBROCYSTIC BREAST DISEASE 09/30/2006  . SEIZURE DISORDER 09/30/2006  . DIZZINESS OR VERTIGO 09/30/2006  . URINARY INCONTINENCE 09/30/2006   Past Medical History  Diagnosis Date  . Vertigo   . GERD (gastroesophageal reflux disease)   . Seizure disorder     single seizure/hypoglycemia  . Urinary incontinence   . Plantar fasciitis   . Colon polyps 08/2008    Adenomatous  .  Gastritis 08/2008    H. pylori  . Lung nodule   . Menopausal disorder   . Multiple sclerosis   . Allergic rhinitis   . Hypercholesteremia   . Diabetes mellitus   . COPD (chronic obstructive pulmonary disease)   . History of shingles     Left V1  . Allergy   . Anxiety     [facility], no per pt  . Depression     no per pt  . Cataract   . Neuromuscular disorder     Multiple sclerosis  . Seizures     1 seizure in 2001 due to blood sugar dropping to 38, none since   Past Surgical History  Procedure Laterality Date  . Cervical discectomy  2004    x 2, Anterior; Fusion C4-5, C5-6, C6-7, Dr. Kary Kos  . Tubal ligation    . Dilation and  curettage of uterus    . Tonsillectomy    . Foot surgery      left plantar fascial problem  . Cholecystectomy    . Chest ct  11/2008    With small 4 mm nodule L lung base (rec re check in 1 year)  . Chest ct  07/2009    Re check chest CT - lung nodule stable  . Ct sinus ltd w/o cm  02/2009    negative  . Colonoscopy  08/2008    Polyps/ re check 5 yrs  . Esophagogastroduodenoscopy  08/2008    Erosive gastritis, h pylori (treated)  . Rotator cuff repair      right  . Upper gastrointestinal endoscopy     Social History  Substance Use Topics  . Smoking status: Heavy Tobacco Smoker -- 1.50 packs/day for 30 years    Types: Cigarettes  . Smokeless tobacco: Never Used  . Alcohol Use: No   Family History  Problem Relation Age of Onset  . Migraines Mother   . Heart attack Mother   . Coronary artery disease Mother   . Coronary artery disease Father     6 bypasses   . Stroke Father   . Diabetes Father   . Renal Disease Father   . Colon cancer Maternal Grandfather 52  . Coronary artery disease Cousin   . Coronary artery disease Paternal Grandmother   . Lung cancer Paternal Aunt   . Multiple sclerosis Neg Hx   . Esophageal cancer Neg Hx   . Rectal cancer Neg Hx   . Stomach cancer Neg Hx    Allergies  Allergen Reactions  . Percocet [Oxycodone-Acetaminophen] Shortness Of Breath    Felt like going to pass out and sweating  . Tecfidera [Dimethyl Fumarate] Shortness Of Breath, Palpitations, Rash and Cough  . Amitriptyline Hcl     REACTION: swelling and itching  . Atorvastatin     REACTION: elevated LFT's  . Clarithromycin     REACTION: reaction not known  . Duloxetine     REACTION: pain  . Levofloxacin     REACTION: Rash  . Pregabalin     REACTION: LE swelling  . Pseudoephedrine     REACTION: legs hurt  . Zoloft [Sertraline Hcl] Other (See Comments)    Headaches    Current Outpatient Prescriptions on File Prior to Visit  Medication Sig Dispense Refill  . ALPRAZolam  (XANAX) 0.5 MG tablet Take 0.5 mg by mouth 2 (two) times daily as needed. For anxiety.    . budesonide-formoterol (SYMBICORT) 160-4.5 MCG/ACT inhaler Inhale 2 puffs into  the lungs 2 (two) times daily. 1 Inhaler 11  . cyclobenzaprine (FLEXERIL) 10 MG tablet Take 1 tablet (10 mg total) by mouth 3 (three) times daily as needed for muscle spasms (watch out for sedation). 30 tablet 1  . furosemide (LASIX) 20 MG tablet Take 20 mg by mouth daily.    Marland Kitchen gabapentin (NEURONTIN) 300 MG capsule TAKE 2 CAPSULES (600 MG TOTAL) BY MOUTH AT BEDTIME. 60 capsule 11  . Probiotic Product (ALIGN PO) Take by mouth daily.    . VENTOLIN HFA 108 (90 BASE) MCG/ACT inhaler INHALE 2 PUFFS UP TO EVERY 4 HOURS AS NEEDED FOR WHEEZING 1 Inhaler 11   No current facility-administered medications on file prior to visit.    Review of Systems Review of Systems  Constitutional: Negative for fever, appetite change,  and unexpected weight change. pos for fatigue  Eyes: Negative for pain and visual disturbance.  Respiratory: Negative for cough and shortness of breath.   Cardiovascular: Negative for cp or palpitations    Gastrointestinal: Negative for nausea, diarrhea and constipation. pos for heartburn  Genitourinary: Negative for urgency and frequency. pos for urine odor and vag itching w/o d/c Skin: Negative for pallor or rash   Neurological: Negative for weakness, light-headedness, numbness and pos for headaches.  Hematological: Negative for adenopathy. Does not bruise/bleed easily.  Psychiatric/Behavioral: Negative for dysphoric mood. The patient is  nervous/anxious.         Objective:   Physical Exam  Constitutional: She appears well-developed and well-nourished. No distress.  Obese and fatigued appearing   HENT:  Head: Normocephalic and atraumatic.  Nose: Nose normal.  Mouth/Throat: Oropharynx is clear and moist. No oropharyngeal exudate.  Scant white coating on tongue (also fissuring)    Eyes: Conjunctivae and EOM  are normal. Pupils are equal, round, and reactive to light. No scleral icterus.  Neck: Normal range of motion. Neck supple.  Cardiovascular: Normal rate, regular rhythm and normal heart sounds.   Pulmonary/Chest: Effort normal and breath sounds normal. No respiratory distress. She has no wheezes. She has no rales.  Abdominal: Soft. Bowel sounds are normal. She exhibits no distension and no mass. There is tenderness. There is no rebound and no guarding.  No cva tenderness  no suprapubic tenderness  Tender in epigastrium- very mild/ no rebound or guarding  Musculoskeletal: She exhibits tenderness. She exhibits no edema.  Tender over L3, L4,L5 Limited rom spine Neg SLR  Pain with int rot of L hip and also L greater trochanteric tenderness    Lymphadenopathy:    She has no cervical adenopathy.  Neurological: She is alert. No cranial nerve deficit. She exhibits normal muscle tone. Coordination normal.  Skin: Skin is warm and dry. No rash noted. No erythema. No pallor.  Psychiatric: Her speech is normal and behavior is normal. Her mood appears not anxious. Her affect is not blunt and not labile. Thought content is not paranoid. She exhibits a depressed mood. She expresses no homicidal and no suicidal ideation.          Assessment & Plan:   Problem List Items Addressed This Visit    GERD    Pt mentions worse heartburn and epigastric burning symptoms  Adv trial of H2 blocker otc and update  Also rev diet       Lumbar disc disease    Ongoing discomfort  Has seen Dr Patrice Paradise and started PT which made her symptoms worse Urged her to f/u with him and disc next step -likely MRI  ua neg today       Lumbar pain - Primary   Relevant Orders   Urinalysis Dipstick (Completed)   MULTIPLE SCLEROSIS    Pt c/o of headache/fatigue and general malaise with betaseron  Enc her to disc with her neurologist at upcoming appt       Oral thrush    With tongue burning symptoms  Uses symbicort-rev  tech / this likely caused it  Will tx with nystatin and update  Enc to rinse mouth well every time she uses symbicort      Relevant Medications   nystatin (MYCOSTATIN) 100000 UNIT/ML suspension   fluconazole (DIFLUCAN) 150 MG tablet   Pain in left hip    This may be related to back pain however also has trochanteric tenderness- wonder if poss bursitis  Adv f/u with Dr Lorelei Pont if no improvement with ice and stretching       Yeast vaginitis    Pt c/o urine odor and vaginal itching  tx with diflucan times one  Update if not improved in a week      Relevant Medications   nystatin (MYCOSTATIN) 100000 UNIT/ML suspension   fluconazole (DIFLUCAN) 150 MG tablet

## 2014-11-01 NOTE — Progress Notes (Signed)
Pre visit review using our clinic review tool, if applicable. No additional management support is needed unless otherwise documented below in the visit note. 

## 2014-11-01 NOTE — Patient Instructions (Signed)
See neurology for your MS and talk about your side effects / sounds like you may need to change your medicine  I think you have thrush- use the nystatin mouthwash  For yeast vaginal infection - try the diflucan  If the hip keeps bothering you (the area that hurts when you lie on it)- see Dr Lorelei Pont  For your back call Dr Towanda Malkin office and tell them that PT made you worse - he may want to do an MRI  For stomach pain and heartburn get zantac (generic is fine) 150 mg and take twice daily for at least a few months

## 2014-11-03 NOTE — Assessment & Plan Note (Signed)
Pt mentions worse heartburn and epigastric burning symptoms  Adv trial of H2 blocker otc and update  Also rev diet

## 2014-11-03 NOTE — Assessment & Plan Note (Signed)
This may be related to back pain however also has trochanteric tenderness- wonder if poss bursitis  Adv f/u with Dr Lorelei Pont if no improvement with ice and stretching

## 2014-11-03 NOTE — Assessment & Plan Note (Signed)
Ongoing discomfort  Has seen Dr Patrice Paradise and started PT which made her symptoms worse Urged her to f/u with him and disc next step -likely MRI  ua neg today

## 2014-11-03 NOTE — Assessment & Plan Note (Signed)
Pt c/o of headache/fatigue and general malaise with betaseron  Enc her to disc with her neurologist at upcoming appt

## 2014-11-03 NOTE — Assessment & Plan Note (Signed)
Pt c/o urine odor and vaginal itching  tx with diflucan times one  Update if not improved in a week

## 2014-11-03 NOTE — Assessment & Plan Note (Signed)
With tongue burning symptoms  Uses symbicort-rev tech / this likely caused it  Will tx with nystatin and update  Enc to rinse mouth well every time she uses symbicort

## 2014-11-21 ENCOUNTER — Encounter: Payer: Self-pay | Admitting: Adult Health

## 2014-11-21 ENCOUNTER — Ambulatory Visit (INDEPENDENT_AMBULATORY_CARE_PROVIDER_SITE_OTHER): Payer: Medicare HMO | Admitting: Adult Health

## 2014-11-21 VITALS — BP 122/80 | HR 68 | Ht 62.0 in | Wt 178.0 lb

## 2014-11-21 DIAGNOSIS — G35 Multiple sclerosis: Secondary | ICD-10-CM

## 2014-11-21 DIAGNOSIS — Z5181 Encounter for therapeutic drug level monitoring: Secondary | ICD-10-CM | POA: Diagnosis not present

## 2014-11-21 NOTE — Progress Notes (Signed)
PATIENT: Karina [name] DOB: May 07, 1955  REASON FOR VISIT: follow up- multiple sclerosis HISTORY FROM: patient  HISTORY OF PRESENT ILLNESS: Ms. Schoenfelder is a 59 year old female with a history of multiple sclerosis. She returns today for follow-up. The patient has started back on Betaseron. She's been taking this for 3 months. She states that since she started Betaseron she has noticed that she's had a headache everyday that she takes injection. She describes the headache as sharp stabbing pain usually in the frontal region. This pain is intermittent throughout the day. She denies any new numbness or weakness. Denies any changes with her gait or balance. Denies any changes with the bowels or bladder. She did follow-up with ophthalmology. She does have cataracts however they're not severe enough for surgery at this point. She does feel that she has blurry vision- this has been ongoing. However she does feel like over time it has gotten worse. She plans to schedule a follow-up with her ophthalmologist. The patient continues to see orthopedist for back pain. She states that she has nerve related pain that starts in the middle of the spine and radiates to the right axilla. She states that this pain is exacerbated by movement of the right arm. She denies any new symptoms. She returns today for an evaluation.  HISTORY  05/17/14: Ms. Lewan is a 59 year old female with a history of multiple sclerosis. She returns today for follow-up. She is currently in the process of starting Betaseron. She is waiting to hear back from the insurance company. She states that she did not want to start Gilenya because she was concerned about the side effects. The patient denies any new weakness. She does have intermittent numbness on the left side of the body. She states the numbness mainly affects the left arm and the left groin area. She denies any changes with her gait or balance. Denies any falls. She does have a history of  urinary frequency. She has had one episode of bowel incontinence but states it was very little amount. The patient does have some blurring of her vision has been going on for the last 1-2 months. She states that she was diagnosed with cataracts when she saw her ophthalmologist last year. She has not followed back up with her ophthalmologist. Patient does complain of left hip pain. She is followed by " back MD" she is unsure of his name. She states that he sent her to physical therapy but she stopped due to pain. She has not followed back up with her doctor. The patient does report some changes with her memory. She states that she will have "blanking episodes." She states that she can walk into the kitchen and will forget where she came and therefore however after a few minutes she usually can recall it. She denies any new medical issues. She returns today for an evaluation.  HISTORY 05/09/13 (WILLIS): Marland Kitchen Lykens is a 59 year old right-handed white female with a history of multiple sclerosis initially diagnosed in 58. The patient indicated that the diagnosis came to light following several syncopal episodes. The patient underwent a MRI of the brain, and she was found to have lesions consistent with MS. The patient indicates that she had a lumbar puncture, and the diagnosis of multiple sclerosis was confirmed. The patient has been on Betaseron for number of years, but she developed calcifications at the sites of injection. The patient was given a trial on Tecfidera, but she could not tolerate this. The patient has been on  no medications for her multiple sclerosis in over 2 years mainly secondary to problems with her insurance, and inability to afford medications even with patient assistance. The patient has had some recent issues with depression, and she has become more withdrawn. The patient last had MRI evaluation of the brain on 07/21/2011, and no enhancing lesions were noted at that time. The patient has not had  any new numbness or weakness of the extremities, vision changes, but she does have some ongoing issues with urinary incontinence. The patient denies any significant balance issues or problems with falling. The patient comes to this office for an evaluation.  REVIEW OF SYSTEMS: Out of a complete 14 system review of symptoms, the patient complains only of the following symptoms, and all other reviewed systems are negative.  Eye discharge, eye redness, blurred vision, cough, ear discharge, restless leg, insomnia, joint pain, joint swelling, back pain, muscle cramps, bruise/bleed easily  ALLERGIES: Allergies  Allergen Reactions  . Percocet [Oxycodone-Acetaminophen] Shortness Of Breath    Felt like going to pass out and sweating  . Tecfidera [Dimethyl Fumarate] Shortness Of Breath, Palpitations, Rash and Cough  . Amitriptyline Hcl     REACTION: swelling and itching  . Atorvastatin     REACTION: elevated LFT's  . Clarithromycin     REACTION: reaction not known  . Duloxetine     REACTION: pain  . Levofloxacin     REACTION: Rash  . Pregabalin     REACTION: LE swelling  . Pseudoephedrine     REACTION: legs hurt  . Zoloft [Sertraline Hcl] Other (See Comments)    Headaches     HOME MEDICATIONS: Outpatient Prescriptions Prior to Visit  Medication Sig Dispense Refill  . ALPRAZolam (XANAX) 0.5 MG tablet Take 0.5 mg by mouth 2 (two) times daily as needed. For anxiety.    Marland Kitchen azelastine (ASTELIN) 0.1 % nasal spray Place 2 sprays into both nostrils 2 (two) times daily as needed.  12  . budesonide-formoterol (SYMBICORT) 160-4.5 MCG/ACT inhaler Inhale 2 puffs into the lungs 2 (two) times daily. 1 Inhaler 11  . cyclobenzaprine (FLEXERIL) 10 MG tablet Take 1 tablet (10 mg total) by mouth 3 (three) times daily as needed for muscle spasms (watch out for sedation). 30 tablet 1  . fluconazole (DIFLUCAN) 150 MG tablet Take 1 tablet (150 mg total) by mouth once. 1 tablet 0  . furosemide (LASIX) 20 MG  tablet Take 20 mg by mouth daily.    Marland Kitchen gabapentin (NEURONTIN) 300 MG capsule TAKE 2 CAPSULES (600 MG TOTAL) BY MOUTH AT BEDTIME. 60 capsule 11  . nystatin (MYCOSTATIN) 100000 UNIT/ML suspension Take 5 mLs (500,000 Units total) by mouth 3 (three) times daily. Swish in mouth 30 seconds and then swallow 150 mL 0  . Probiotic Product (ALIGN PO) Take by mouth daily.    . VENTOLIN HFA 108 (90 BASE) MCG/ACT inhaler INHALE 2 PUFFS UP TO EVERY 4 HOURS AS NEEDED FOR WHEEZING 1 Inhaler 11   No facility-administered medications prior to visit.    PAST MEDICAL HISTORY: Past Medical History  Diagnosis Date  . Vertigo   . GERD (gastroesophageal reflux disease)   . Seizure disorder     single seizure/hypoglycemia  . Urinary incontinence   . Plantar fasciitis   . Colon polyps 08/2008    Adenomatous  . Gastritis 08/2008    H. pylori  . Lung nodule   . Menopausal disorder   . Multiple sclerosis   . Allergic rhinitis   .  Hypercholesteremia   . Diabetes mellitus   . COPD (chronic obstructive pulmonary disease)   . History of shingles     Left V1  . Allergy   . Anxiety     Councelor- Pervis Hocking, no per pt  . Depression     no per pt  . Cataract   . Neuromuscular disorder     Multiple sclerosis  . Seizures     1 seizure in 2001 due to blood sugar dropping to 38, none since    PAST SURGICAL HISTORY: Past Surgical History  Procedure Laterality Date  . Cervical discectomy  2004    x 2, Anterior; Fusion C4-5, C5-6, C6-7, Dr. Kary Kos  . Tubal ligation    . Dilation and curettage of uterus    . Tonsillectomy    . Foot surgery      left plantar fascial problem  . Cholecystectomy    . Chest ct  11/2008    With small 4 mm nodule L lung base (rec re check in 1 year)  . Chest ct  07/2009    Re check chest CT - lung nodule stable  . Ct sinus ltd w/o cm  02/2009    negative  . Colonoscopy  08/2008    Polyps/ re check 5 yrs  . Esophagogastroduodenoscopy  08/2008    Erosive gastritis, h  pylori (treated)  . Rotator cuff repair      right  . Upper gastrointestinal endoscopy      FAMILY HISTORY: Family History  Problem Relation Age of Onset  . Migraines Mother   . Heart attack Mother   . Coronary artery disease Mother   . Coronary artery disease Father     6 bypasses   . Stroke Father   . Diabetes Father   . Renal Disease Father   . Colon cancer Maternal Grandfather 77  . Coronary artery disease Cousin   . Coronary artery disease Paternal Grandmother   . Lung cancer Paternal Aunt   . Multiple sclerosis Neg Hx   . Esophageal cancer Neg Hx   . Rectal cancer Neg Hx   . Stomach cancer Neg Hx     SOCIAL HISTORY: Social History   Social History  . Marital Status: Married    Spouse Name: N/A  . Number of Children: 3  . Years of Education: 9 th   Occupational History  . Disabled     Social History Main Topics  . Smoking status: Heavy Tobacco Smoker -- 1.50 packs/day for 30 years    Types: Cigarettes  . Smokeless tobacco: Never Used  . Alcohol Use: No  . Drug Use: No  . Sexual Activity: Not on file   Other Topics Concern  . Not on file   Social History Narrative   Married with 3 children   Daily caffeine use - 2    Disabled.   Education 9th grade    Caffeine one cup of coffee daily.      PHYSICAL EXAM  Filed Vitals:   11/21/14 1051  BP: 122/80  Pulse: 68  Height: 5\' 2"  (1.575 m)  Weight: 178 lb (80.74 kg)   Body mass index is 32.55 kg/(m^2).  Generalized: Well developed, in no acute distress   Neurological examination  Mentation: Alert oriented to time, place, history taking. Follows all commands speech and language fluent Cranial nerve II-XII: Pupils were equal round reactive to light. Extraocular movements were full, visual field were full on confrontational test. Facial sensation  and strength were normal. Uvula tongue midline. Head turning and shoulder shrug  were normal and symmetric. Motor: The motor testing reveals 5 over 5  strength of all 4 extremities. Good symmetric motor tone is noted throughout.  Sensory: Sensory testing is intact to soft touch on all 4 extremities. No evidence of extinction is noted.  Coordination: Cerebellar testing reveals good finger-nose-finger and heel-to-shin bilaterally.  Gait and station: Gait is normal. Tandem gait is normal. Romberg is negative. No drift is seen.  Reflexes: Deep tendon reflexes are symmetric and normal bilaterally.   DIAGNOSTIC DATA (LABS, IMAGING, TESTING) - I reviewed patient records, labs, notes, testing and imaging myself where available.  Lab Results  Component Value Date   WBC 6.5 07/09/2013   HGB 15.1 07/09/2013   HCT 45.0 07/09/2013   MCV 96 07/09/2013   PLT 147* 07/09/2013      Component Value Date/Time   NA 136 07/09/2013 1803   NA 138 01/24/2013 0837   K 4.2 07/09/2013 1803   K 4.7 01/24/2013 0837   CL 105 07/09/2013 1803   CL 105 01/24/2013 0837   CO2 22 07/09/2013 1803   CO2 27 01/24/2013 0837   GLUCOSE 96 07/09/2013 1803   GLUCOSE 102* 01/24/2013 0837   BUN 16 07/09/2013 1803   BUN 12 01/24/2013 0837   CREATININE 1.13 07/09/2013 1803   CREATININE 1.1 01/24/2013 0837   CALCIUM 9.6 07/09/2013 1803   CALCIUM 9.5 01/24/2013 0837   PROT 7.3 01/24/2013 0837   ALBUMIN 3.9 01/24/2013 0837   AST 21 01/24/2013 0837   ALT 18 01/24/2013 0837   ALKPHOS 76 01/24/2013 0837   BILITOT 0.6 01/24/2013 0837   GFRNONAA 55* 07/09/2013 1803   GFRNONAA 67* 11/19/2011 1721   GFRAA >60 07/09/2013 1803   GFRAA 41* 11/19/2011 1721   ASSESSMENT AND PLAN 59 y.o. year old female  has a past medical history of Vertigo; GERD (gastroesophageal reflux disease); Seizure disorder; Urinary incontinence; Plantar fasciitis; Colon polyps (08/2008); Gastritis (08/2008); Lung nodule; Menopausal disorder; Multiple sclerosis; Allergic rhinitis; Hypercholesteremia; Diabetes mellitus; COPD (chronic obstructive pulmonary disease); History of shingles; Allergy; Anxiety;  Depression; Cataract; Neuromuscular disorder; and Seizures. here with:  1. Multiple sclerosis  The patient will continue on Betaseron for now. If she continues to have headaches we may have to consider changing medications. Her last MRI was in March 2015. We will repeat MRI of the brain looking for any progression of MS. We will also check blood work today. Patient advised that if her symptoms worsen or she develops any new symptoms she should let us know. She will follow-up in 3-4 months with Dr. Jannifer Franklin.   Ward Givens, MSN, NP-C 11/21/2014, 11:13 AM [facility] [address] [place], [place] [address] [phone]

## 2014-11-21 NOTE — Patient Instructions (Signed)
Continue Betaseron If no improve with headache may have to consider switching medications I will check MRI brain Blood work today.

## 2014-11-21 NOTE — Progress Notes (Signed)
I have read the note, and I agree with the clinical assessment and plan.  Karina [name]   

## 2014-11-22 LAB — COMPREHENSIVE METABOLIC PANEL
ALT: 13 IU/L (ref 0–32)
AST: 15 IU/L (ref 0–40)
Albumin/Globulin Ratio: 1.7 (ref 1.1–2.5)
Albumin: 4.3 g/dL (ref 3.5–5.5)
Alkaline Phosphatase: 74 IU/L (ref 39–117)
BUN/Creatinine Ratio: 11 (ref 9–23)
BUN: 11 mg/dL (ref 6–24)
Bilirubin Total: 0.3 mg/dL (ref 0.0–1.2)
CO2: 28 mmol/L (ref 18–29)
Calcium: 9.8 mg/dL (ref 8.7–10.2)
Chloride: 98 mmol/L (ref 97–108)
Creatinine, Ser: 1 mg/dL (ref 0.57–1.00)
GFR calc Af Amer: 73 mL/min/{1.73_m2} (ref >59)
GFR calc non Af Amer: 63 mL/min/{1.73_m2} (ref >59)
Globulin, Total: 2.6 g/dL (ref 1.5–4.5)
Glucose: 86 mg/dL (ref 65–99)
Potassium: 4.2 mmol/L (ref 3.5–5.2)
Sodium: 141 mmol/L (ref 134–144)
Total Protein: 6.9 g/dL (ref 6.0–8.5)

## 2014-11-22 LAB — CBC WITH DIFFERENTIAL/PLATELET
Basophils Absolute: 0.1 10*3/uL (ref 0.0–0.2)
Basos: 1 %
EOS (ABSOLUTE): 0.2 10*3/uL (ref 0.0–0.4)
Eos: 3 %
Hematocrit: 44.2 % (ref 34.0–46.6)
Hemoglobin: 14.6 g/dL (ref 11.1–15.9)
Immature Grans (Abs): 0 10*3/uL (ref 0.0–0.1)
Immature Granulocytes: 0 %
Lymphocytes Absolute: 2.3 10*3/uL (ref 0.7–3.1)
Lymphs: 33 %
MCH: 32.2 pg (ref 26.6–33.0)
MCHC: 33 g/dL (ref 31.5–35.7)
MCV: 97 fL (ref 79–97)
Monocytes Absolute: 0.5 10*3/uL (ref 0.1–0.9)
Monocytes: 7 %
Neutrophils Absolute: 3.9 10*3/uL (ref 1.4–7.0)
Neutrophils: 56 %
Platelets: 203 10*3/uL (ref 150–379)
RBC: 4.54 x10E6/uL (ref 3.77–5.28)
RDW: 13.6 % (ref 12.3–15.4)
WBC: 6.9 10*3/uL (ref 3.4–10.8)

## 2014-11-25 ENCOUNTER — Telehealth: Payer: Self-pay

## 2014-11-25 NOTE — Telephone Encounter (Signed)
Called and spoke  to patient relayed labs were normal. Patient understood about her labs.

## 2014-11-25 NOTE — Telephone Encounter (Signed)
-----   Message from Ward Givens, NP sent at 11/22/2014  3:53 PM EDT ----- Lab work is normal. Please call patient.

## 2014-12-06 ENCOUNTER — Ambulatory Visit
Admission: RE | Admit: 2014-12-06 | Discharge: 2014-12-06 | Disposition: A | Discharge status: Home / Self Care | Payer: Medicare HMO | Source: Ambulatory Visit | Attending: Adult Health | Admitting: Adult Health

## 2014-12-06 DIAGNOSIS — G35 Multiple sclerosis: Secondary | ICD-10-CM

## 2014-12-06 MED ORDER — GADOBENATE DIMEGLUMINE 529 MG/ML IV SOLN
15.0000 mL | Freq: Once | INTRAVENOUS | Status: AC | PRN
  Administered 2014-12-06: 15 mL via INTRAVENOUS

## 2014-12-09 ENCOUNTER — Telehealth: Payer: Self-pay

## 2014-12-09 NOTE — Telephone Encounter (Signed)
-----   Message from Ward Givens, NP sent at 12/09/2014  1:01 PM EDT ----- No change from previous MRI.

## 2014-12-09 NOTE — Telephone Encounter (Signed)
Called cell#, spoke to a man that says best time to reach patient is after 5pm.

## 2014-12-11 NOTE — Telephone Encounter (Signed)
Tried to reach patient several times. Unsuccessful. Will send letter.

## 2014-12-12 NOTE — Telephone Encounter (Signed)
This encounter was created in error - please disregard.

## 2014-12-16 NOTE — Telephone Encounter (Signed)
Spoke to patient. Gave MRI results. Patient verbalized understanding.  

## 2014-12-16 NOTE — Telephone Encounter (Signed)
Pt called requesting MRI results. She states to call home number is best to speak with her. Please call and advise. Patient can be reached at [phone].

## 2015-01-20 ENCOUNTER — Telehealth: Payer: Self-pay | Admitting: Neurology

## 2015-01-20 NOTE — Telephone Encounter (Signed)
Ins has been contacted and provided with clinical info.  Request is currently under review Ref # [id number]

## 2015-01-20 NOTE — Telephone Encounter (Signed)
Engineer, materials program called and states this office needs to call pt's insurance company for prior auth Interferon Beta-1b (BETASERON) 0.3 MG KIT injection. Call back [phone]

## 2015-01-21 ENCOUNTER — Telehealth: Payer: Self-pay | Admitting: Neurology

## 2015-01-21 NOTE — Telephone Encounter (Signed)
Karina [name] with [employer] assistant program called to check status of prior auth. I relayed infor and she understood. Sts she will check back

## 2015-01-21 NOTE — Telephone Encounter (Signed)
Holland Falling has replied stating there is already an approval in place, please have the pharmacy submit the claim.  If there are any issues, they will need to contact the pharmacy help desk.  Ref Member ID# [id number]  I called Betaseron back.  Spoke with [place].  She will note the file.  They will call us back if anything further is needed.

## 2015-02-03 NOTE — Telephone Encounter (Signed)
error 

## 2015-02-05 ENCOUNTER — Other Ambulatory Visit: Payer: Self-pay

## 2015-02-05 MED ORDER — GABAPENTIN 300 MG PO CAPS: ORAL_CAPSULE | ORAL | Status: DC

## 2015-02-11 ENCOUNTER — Other Ambulatory Visit: Payer: Self-pay | Admitting: Family Medicine

## 2015-02-11 ENCOUNTER — Ambulatory Visit (INDEPENDENT_AMBULATORY_CARE_PROVIDER_SITE_OTHER): Payer: Medicare HMO | Admitting: Family Medicine

## 2015-02-11 ENCOUNTER — Encounter: Payer: Self-pay | Admitting: Family Medicine

## 2015-02-11 VITALS — BP 134/76 | HR 76 | Temp 97.8°F | Wt 178.8 lb

## 2015-02-11 DIAGNOSIS — J209 Acute bronchitis, unspecified: Secondary | ICD-10-CM | POA: Diagnosis not present

## 2015-02-11 DIAGNOSIS — Z720 Tobacco use: Secondary | ICD-10-CM

## 2015-02-11 DIAGNOSIS — F17200 Nicotine dependence, unspecified, uncomplicated: Secondary | ICD-10-CM

## 2015-02-11 MED ORDER — ALBUTEROL SULFATE HFA 108 (90 BASE) MCG/ACT IN AERS: INHALATION_SPRAY | RESPIRATORY_TRACT | Status: DC

## 2015-02-11 MED ORDER — BUDESONIDE-FORMOTEROL FUMARATE 160-4.5 MCG/ACT IN AERO: 2.0000 | INHALATION_SPRAY | Freq: Two times a day (BID) | RESPIRATORY_TRACT | Status: DC

## 2015-02-11 MED ORDER — PREDNISONE 10 MG PO TABS: ORAL_TABLET | ORAL | Status: DC

## 2015-02-11 MED ORDER — CEFDINIR 300 MG PO CAPS: 300.0000 mg | ORAL_CAPSULE | Freq: Two times a day (BID) | ORAL | Status: DC

## 2015-02-11 MED ORDER — ALPRAZOLAM 0.5 MG PO TABS: 0.5000 mg | ORAL_TABLET | Freq: Two times a day (BID) | ORAL | Status: DC | PRN

## 2015-02-11 NOTE — Patient Instructions (Signed)
You have bronchitis with wheezing Try to quit smoking I refilled you inhalers Take cefdinir (antibiotic)  Also prednisone for wheezing  Robitussin for cough  Drink lots of fluids

## 2015-02-11 NOTE — Progress Notes (Signed)
Pre visit review using our clinic review tool, if applicable. No additional management support is needed unless otherwise documented below in the visit note. 

## 2015-02-11 NOTE — Progress Notes (Signed)
Subjective:    Patient ID: Karina [name], female    DOB: Jun 27, 1955, 59 y.o.   MRN: [id number]  HPI Here with uri symptoms   Smoker with lung dz  Started with sneezing and ST  Now sob and coughing   Not a lot of nasal drainage   Wheezing a lot  Prod cough - green mucous  Sweats but no fever     Still smokes - less this week because she has been sick -would like to quit but not ready   Inhalers - is almost out  symbicort and ventolin   Patient Active Problem List   Diagnosis Date Noted  . Oral thrush 11/01/2014  . Lumbar disc disease 11/01/2014  . Pain in left hip 11/01/2014  . Knee pain, bilateral 03/11/2014  . Yeast vaginitis 11/30/2013  . Thoracic back pain 11/30/2013  . Vaginal discharge 11/30/2013  . Vulvar boil 11/30/2013  . Infectious mononucleosis 07/11/2013  . Bronchitis, acute, with bronchospasm 07/11/2013  . Encounter for routine gynecological examination 01/31/2013  . Dysuria 01/31/2013  . Encounter for Medicare annual wellness exam 01/23/2013  . Contusion of lower leg, left 10/04/2012  . Chest pain 09/20/2012  . SOB (shortness of breath) 09/20/2012  . Dyspnea 09/13/2012  . Asthmatic bronchitis , chronic ([place]) 12/06/2011  . Chronic cough 11/22/2011  . OTHER DISORDERS OF EYELID 04/02/2010  . ANXIETY, SITUATIONAL 07/17/2009  . Lumbar pain 12/27/2008  . HEMORRHOIDS-EXTERNAL 11/07/2008  . IRRITABLE BOWEL SYNDROME 11/07/2008  . LUNG NODULE 10/14/2008  . HYPERGLYCEMIA, BORDERLINE 10/14/2008  . PERSONAL HX COLONIC POLYPS 10/08/2008  . UNSPECIFIED VITAMIN D DEFICIENCY 07/23/2008  . SYNCOPE, HX OF 06/01/2008  . FOOT SURGERY, HX OF 06/01/2008  . DILATION AND CURETTAGE, HX OF 06/01/2008  . OSTEOARTHRITIS, SHOULDER, RIGHT 03/22/2008  . ROTATOR CUFF SYNDROME, RIGHT 03/22/2008  . ARTHRALGIA 12/12/2007  . Allergic rhinitis 08/01/2007  . Smoker 10/17/2006  . NEUROPATHY-PERIPHERAL 10/17/2006  . FOOT PAIN, BILATERAL 10/17/2006  . SYMPTOM, EDEMA 10/03/2006    . HYPOGLYCEMIA 09/30/2006  . HYPERCHOLESTEROLEMIA 09/30/2006  . MULTIPLE SCLEROSIS 09/30/2006  . GERD 09/30/2006  . FIBROCYSTIC BREAST DISEASE 09/30/2006  . SEIZURE DISORDER 09/30/2006  . DIZZINESS OR VERTIGO 09/30/2006  . URINARY INCONTINENCE 09/30/2006   Past Medical History  Diagnosis Date  . Vertigo   . GERD (gastroesophageal reflux disease)   . Seizure disorder ([place])     single seizure/hypoglycemia  . Urinary incontinence   . Plantar fasciitis   . Colon polyps 08/2008    Adenomatous  . Gastritis 08/2008    H. pylori  . Lung nodule   . Menopausal disorder   . Multiple sclerosis ([place])   . Allergic rhinitis   . Hypercholesteremia   . Diabetes mellitus   . COPD (chronic obstructive pulmonary disease) ([place])   . History of shingles     Left V1  . Allergy   . Anxiety     Councelor- Pervis Hocking, no per pt  . Depression     no per pt  . Cataract   . Neuromuscular disorder ([place])     Multiple sclerosis  . Seizures ([place])     1 seizure in 2001 due to blood sugar dropping to 38, none since   Past Surgical History  Procedure Laterality Date  . Cervical discectomy  2004    x 2, Anterior; Fusion C4-5, C5-6, C6-7, Dr. Kary Kos  . Tubal ligation    . Dilation and curettage of uterus    . Tonsillectomy    .  Foot surgery      left plantar fascial problem  . Cholecystectomy    . Chest ct  11/2008    With small 4 mm nodule L lung base (rec re check in 1 year)  . Chest ct  07/2009    Re check chest CT - lung nodule stable  . Ct sinus ltd w/o cm  02/2009    negative  . Colonoscopy  08/2008    Polyps/ re check 5 yrs  . Esophagogastroduodenoscopy  08/2008    Erosive gastritis, h pylori (treated)  . Rotator cuff repair      right  . Upper gastrointestinal endoscopy     Social History  Substance Use Topics  . Smoking status: Heavy Tobacco Smoker -- 1.50 packs/day for 30 years    Types: Cigarettes  . Smokeless tobacco: Never Used  . Alcohol Use: No   Family History   Problem Relation Age of Onset  . Migraines Mother   . Heart attack Mother   . Coronary artery disease Mother   . Coronary artery disease Father     6 bypasses   . Stroke Father   . Diabetes Father   . Renal Disease Father   . Colon cancer Maternal Grandfather 22  . Coronary artery disease Cousin   . Coronary artery disease Paternal Grandmother   . Lung cancer Paternal Aunt   . Multiple sclerosis Neg Hx   . Esophageal cancer Neg Hx   . Rectal cancer Neg Hx   . Stomach cancer Neg Hx    Allergies  Allergen Reactions  . Percocet [Oxycodone-Acetaminophen] Shortness Of Breath    Felt like going to pass out and sweating  . Tecfidera [Dimethyl Fumarate] Shortness Of Breath, Palpitations, Rash and Cough  . Amitriptyline Hcl     REACTION: swelling and itching  . Atorvastatin     REACTION: elevated LFT's  . Clarithromycin     REACTION: reaction not known  . Duloxetine     REACTION: pain  . Levofloxacin     REACTION: Rash  . Pregabalin     REACTION: LE swelling  . Pseudoephedrine     REACTION: legs hurt  . Zoloft [Sertraline Hcl] Other (See Comments)    Headaches    Current Outpatient Prescriptions on File Prior to Visit  Medication Sig Dispense Refill  . ALPRAZolam (XANAX) 0.5 MG tablet Take 0.5 mg by mouth 2 (two) times daily as needed. For anxiety.    Marland Kitchen azelastine (ASTELIN) 0.1 % nasal spray Place 2 sprays into both nostrils 2 (two) times daily as needed.  12  . budesonide-formoterol (SYMBICORT) 160-4.5 MCG/ACT inhaler Inhale 2 puffs into the lungs 2 (two) times daily. 1 Inhaler 11  . cyclobenzaprine (FLEXERIL) 10 MG tablet Take 1 tablet (10 mg total) by mouth 3 (three) times daily as needed for muscle spasms (watch out for sedation). 30 tablet 1  . furosemide (LASIX) 20 MG tablet Take 20 mg by mouth daily.    Marland Kitchen gabapentin (NEURONTIN) 300 MG capsule TAKE 2 CAPSULES (600 MG TOTAL) BY MOUTH AT BEDTIME. 180 capsule 0  . Interferon Beta-1b (BETASERON) 0.3 MG KIT injection Inject  into the skin every other day.    . Probiotic Product (ALIGN PO) Take by mouth daily.    . VENTOLIN HFA 108 (90 BASE) MCG/ACT inhaler INHALE 2 PUFFS UP TO EVERY 4 HOURS AS NEEDED FOR WHEEZING 1 Inhaler 11  . nystatin (MYCOSTATIN) 100000 UNIT/ML suspension Take 5 mLs (500,000 Units total) by mouth  3 (three) times daily. Swish in mouth 30 seconds and then swallow (Patient not taking: Reported on 02/11/2015) 150 mL 0   No current facility-administered medications on file prior to visit.      Review of Systems Review of Systems  Constitutional: Negative for fever, appetite change, and unexpected weight change.  ENT pos for cong and rhinorrhea and ST Eyes: Negative for pain and visual disturbance.  Respiratory: pos for cough and shortness of breath.  With wheezing  Cardiovascular: Negative for cp or palpitations    Gastrointestinal: Negative for nausea, diarrhea and constipation.  Genitourinary: Negative for urgency and frequency.  Skin: Negative for pallor or rash   Neurological: Negative for weakness, light-headedness, numbness and headaches.  Hematological: Negative for adenopathy. Does not bruise/bleed easily.  Psychiatric/Behavioral: Negative for dysphoric mood. The patient is not nervous/anxious.         Objective:   Physical Exam  Constitutional: She appears well-developed and well-nourished. No distress.  obese and well appearing   HENT:  Head: Normocephalic and atraumatic.  Right Ear: External ear normal.  Left Ear: External ear normal.  Mouth/Throat: Oropharynx is clear and moist.  Nares are injected and congested  No sinus tenderness Clear rhinorrhea and post nasal drip   Eyes: Conjunctivae and EOM are normal. Pupils are equal, round, and reactive to light. Right eye exhibits no discharge. Left eye exhibits no discharge.  Neck: Normal range of motion. Neck supple.  Cardiovascular: Normal rate and normal heart sounds.   Pulmonary/Chest: Effort normal. No respiratory  distress. She has wheezes. She has no rales. She exhibits no tenderness.  Diffusely distant bs  slt prolonged exp phase  Few scattered rhonchi  Wheeze on forced exp in all areas  No rales   Lymphadenopathy:    She has no cervical adenopathy.  Neurological: She is alert.  Skin: Skin is warm and dry. No rash noted.  Psychiatric: She has a normal mood and affect.          Assessment & Plan:   Problem List Items Addressed This Visit      Respiratory   Acute bronchitis with bronchospasm - Primary    In a smoker  Cover with cefdinir Prednisone taper for reactive airways Continue inhalers  Robitussin prn  Disc symptomatic care - see instructions on AVS  Update if not starting to improve in a week or if worsening   Enc to quit smoking         Other   Smoker    Disc in detail risks of smoking and possible outcomes including copd, vascular/ heart disease, cancer , respiratory and sinus infections  Pt voices understanding She has cut down with this illness but does not think she is ready to quit  Recommended nicotine patches

## 2015-02-12 NOTE — Assessment & Plan Note (Signed)
Disc in detail risks of smoking and possible outcomes including copd, vascular/ heart disease, cancer , respiratory and sinus infections  Pt voices understanding She has cut down with this illness but does not think she is ready to quit  Recommended nicotine patches

## 2015-02-12 NOTE — Assessment & Plan Note (Signed)
In a smoker  Cover with cefdinir Prednisone taper for reactive airways Continue inhalers  Robitussin prn  Disc symptomatic care - see instructions on AVS  Update if not starting to improve in a week or if worsening   Enc to quit smoking

## 2015-02-18 ENCOUNTER — Ambulatory Visit (INDEPENDENT_AMBULATORY_CARE_PROVIDER_SITE_OTHER): Payer: Medicare HMO | Admitting: Neurology

## 2015-02-18 ENCOUNTER — Encounter: Payer: Self-pay | Admitting: Neurology

## 2015-02-18 VITALS — BP 156/81 | HR 74 | Ht 62.0 in | Wt 181.5 lb

## 2015-02-18 DIAGNOSIS — G35 Multiple sclerosis: Secondary | ICD-10-CM | POA: Diagnosis not present

## 2015-02-18 NOTE — Progress Notes (Signed)
Reason for visit: Multiple sclerosis  Karina [name] is an 59 y.o. female  History of present illness:  Karina [name] is a 59 year old right-handed white female with a history of multiple sclerosis. She had been on Betaseron for quite a number of years, but she was taken off the medication to try Tecfidera, but she was unable to tolerate the Tecfidera secondary to severe fatigue. She went back on the Betaseron, but she began having severe headaches every time she injected the medication, and she had to stop the drug one month ago. She has not noted any new issues with the multiple sclerosis. She has noted over the last 2 years that she has had a change in personality, she is more irritable, more likely to say what is on her mind. She denies depression. She has not noted any visual changes, but she does have cataract problems which seems to impair visual acuity some. The patient denies any new numbness or weakness on the face, arms, or legs. She did have a bout of bronchitis 3 weeks ago, she was running some low-grade fevers, and she had one day of some burning dysesthesias on her right forearm. This has not been persistent. She denies any changes in bowel or bladder control. She returns for an evaluation.  Past Medical History  Diagnosis Date  . Vertigo   . GERD (gastroesophageal reflux disease)   . Seizure disorder ([place])     single seizure/hypoglycemia  . Urinary incontinence   . Plantar fasciitis   . Colon polyps 08/2008    Adenomatous  . Gastritis 08/2008    H. pylori  . Lung nodule   . Menopausal disorder   . Multiple sclerosis ([place])   . Allergic rhinitis   . Hypercholesteremia   . Diabetes mellitus   . COPD (chronic obstructive pulmonary disease) ([place])   . History of shingles     Left V1  . Allergy   . Anxiety     [place]- [facility] [place], no per pt  . Depression     no per pt  . Cataract   . Neuromuscular disorder ([place])     Multiple sclerosis  . Seizures ([place])     1  seizure in 2001 due to blood sugar dropping to 38, none since    Past Surgical History  Procedure Laterality Date  . Cervical discectomy  2004    x 2, Anterior; Fusion C4-5, C5-6, C6-7, Dr. Kary Kos  . Tubal ligation    . Dilation and curettage of uterus    . Tonsillectomy    . Foot surgery      left plantar fascial problem  . Cholecystectomy    . Chest ct  11/2008    With small 4 mm nodule L lung base (rec re check in 1 year)  . Chest ct  07/2009    Re check chest CT - lung nodule stable  . Ct sinus ltd w/o cm  02/2009    negative  . Colonoscopy  08/2008    Polyps/ re check 5 yrs  . Esophagogastroduodenoscopy  08/2008    Erosive gastritis, h pylori (treated)  . Rotator cuff repair      right  . Upper gastrointestinal endoscopy      Family History  Problem Relation Age of Onset  . Migraines Mother   . Heart attack Mother   . Coronary artery disease Mother   . Coronary artery disease Father     6 bypasses   .  Stroke Father   . Diabetes Father   . Renal Disease Father   . Colon cancer Maternal Grandfather 85  . Coronary artery disease Cousin   . Coronary artery disease Paternal Grandmother   . Lung cancer Paternal Aunt   . Multiple sclerosis Neg Hx   . Esophageal cancer Neg Hx   . Rectal cancer Neg Hx   . Stomach cancer Neg Hx     Social history:  reports that she has been smoking Cigarettes.  She has a 30 pack-year smoking history. She has never used smokeless tobacco. She reports that she does not drink alcohol or use illicit drugs.    Allergies  Allergen Reactions  . Percocet [Oxycodone-Acetaminophen] Shortness Of Breath    Felt like going to pass out and sweating  . Tecfidera [Dimethyl Fumarate] Shortness Of Breath, Palpitations, Rash and Cough  . Amitriptyline Hcl     REACTION: swelling and itching  . Atorvastatin     REACTION: elevated LFT's  . Clarithromycin     REACTION: reaction not known  . Duloxetine     REACTION: pain  . Levofloxacin      REACTION: Rash  . Pregabalin     REACTION: LE swelling  . Pseudoephedrine     REACTION: legs hurt  . Zoloft [Sertraline Hcl] Other (See Comments)    Headaches     Medications:  Prior to Admission medications   Medication Sig Start Date End Date Taking? Authorizing Provider  albuterol (VENTOLIN HFA) 108 (90 BASE) MCG/ACT inhaler INHALE 2 PUFFS UP TO EVERY 4 HOURS AS NEEDED FOR WHEEZING 02/11/15  Yes Abner Greenspan, MD  ALPRAZolam Duanne Moron) 0.5 MG tablet Take 1 tablet (0.5 mg total) by mouth 2 (two) times daily as needed. For anxiety. 02/11/15  Yes Abner Greenspan, MD  azelastine (ASTELIN) 0.1 % nasal spray Place 2 sprays into both nostrils 2 (two) times daily as needed. 09/20/14  Yes Historical Provider, MD  budesonide-formoterol (SYMBICORT) 160-4.5 MCG/ACT inhaler Inhale 2 puffs into the lungs 2 (two) times daily. 02/11/15  Yes Abner Greenspan, MD  cefdinir (OMNICEF) 300 MG capsule Take 1 capsule (300 mg total) by mouth 2 (two) times daily. 02/11/15  Yes Abner Greenspan, MD  CRESTOR 20 MG tablet TAKE 1/2 TABLET BY MOUTH DAILY 02/12/15  Yes Abner Greenspan, MD  cyclobenzaprine (FLEXERIL) 10 MG tablet Take 1 tablet (10 mg total) by mouth 3 (three) times daily as needed for muscle spasms (watch out for sedation). 12/20/13  Yes Abner Greenspan, MD  furosemide (LASIX) 20 MG tablet Take 20 mg by mouth daily.   Yes Historical Provider, MD  gabapentin (NEURONTIN) 300 MG capsule TAKE 2 CAPSULES (600 MG TOTAL) BY MOUTH AT BEDTIME. 02/05/15  Yes Kathrynn Ducking, MD  nystatin (MYCOSTATIN) 100000 UNIT/ML suspension Take 5 mLs (500,000 Units total) by mouth 3 (three) times daily. Swish in mouth 30 seconds and then swallow 11/01/14  Yes Abner Greenspan, MD  predniSONE (DELTASONE) 10 MG tablet Take 3 pills once daily by mouth for 3 days, then 2 pills once daily for 3 days, then 1 pill once daily for 3 days and then stop 02/11/15  Yes Abner Greenspan, MD  Probiotic Product (ALIGN PO) Take by mouth daily.   Yes Historical Provider,  MD  Interferon Beta-1b (BETASERON) 0.3 MG KIT injection Inject into the skin every other day.    Historical Provider, MD    ROS:  Out of a complete 14 system review of  symptoms, the patient complains only of the following symptoms, and all other reviewed systems are negative.  Excessive sweating Ear discharge Cough, wheezing, shortness of breath Restless legs, insomnia Incontinence of the bladder, frequency of urination Joint pain, joint swelling, back pain Skin wounds Headache Agitation  Blood pressure 156/81, pulse 74, height $RemoveBe'5\' 2"'MlSxOWVNw$  (1.575 m), weight 181 lb 8 oz (82.328 kg).  Physical Exam  General: The patient is alert and cooperative at the time of the examination.  Skin: No significant peripheral edema is noted.   Neurologic Exam  Mental status: The patient is alert and oriented x 3 at the time of the examination. The patient has apparent normal recent and remote memory, with an apparently normal attention span and concentration ability.   Cranial nerves: Facial symmetry is present. Speech is normal, no aphasia or dysarthria is noted. Extraocular movements are full. Visual fields are full. Pupils are equal, round, and reactive to light. Discs are flat bilaterally.  Motor: The patient has good strength in all 4 extremities.  Sensory examination: Soft touch sensation is symmetric on the face, arms, and legs.  Coordination: The patient has good finger-nose-finger and heel-to-shin bilaterally.  Gait and station: The patient has a normal gait. Tandem gait is normal. Romberg is negative. No drift is seen.  Reflexes: Deep tendon reflexes are symmetric.   MRI brain 12/09/14:  IMPRESSION: This is an abnormal MRI of the brain with and without contrast showing T2/FLAIR hyperintense foci in a pattern and configuration consistent with multiple sclerosis. There are no enhancing foci. Compared to the MRI of the brain dated 07/25/2013, there has been no interval change.  There  are no acute findings.  * MRI scan images were reviewed online. I agree with the written report.    Assessment/Plan:  1. Multiple sclerosis  The patient has not had good tolerance of the Betaseron. She has come off the medication. We have discussed other treatment options, she indicates that she may consider Gilenya, but she wants to discuss the issue with her cardiologist first, she does have a history of mild coronary artery disease. She is not on any medications that lower the heart rate. If the patient decides that she wants to go on Gilenya, she will need to sign the initiation form, and get blood work done. She will follow-up in 4 months. We have also discussed Aubagio, but the possibility of hair loss is concerning to her.  Jill Alexanders MD 02/18/2015 6:58 PM  [facility] [address] [place], [place] [address]  Phone [phone] Fax [phone]

## 2015-02-18 NOTE — Patient Instructions (Signed)
Multiple Sclerosis °Multiple sclerosis (MS) is a disease of the central nervous system. It leads to the loss of the insulating covering of the nerves (myelin sheath) of your brain. When this happens, brain signals do not get sent properly or may not get sent at all. The age of onset of MS varies.  °CAUSES °The cause of MS is unknown. However, it is more common in the northern [place] than in the southern [place]. °RISK FACTORS °There is a higher number of women with MS than men. MS is not an illness that is passed down to you from your family members (inherited). However, your risk of MS is higher if you have a relative with MS. °SIGNS AND SYMPTOMS  °The symptoms of MS occur in episodes or attacks. These attacks may last weeks to months. There may be long periods of almost no symptoms between attacks. The symptoms of MS vary. This is because of the many different ways it affects the central nervous system. The main symptoms of MS include: °· Vision problems and eye pain. °· Numbness. °· Weakness. °· Inability to move your arms, hands, feet, or legs (paralysis). °· Balance problems. °· Tremors. °DIAGNOSIS  °Your health care provider can diagnose MS with the help of imaging exams and lab tests. These may include specialized X-ray exams and spinal fluid tests. The best imaging exam to confirm a diagnosis of MS is an MRI. °TREATMENT  °There is no known cure for MS, but there are medicines that can decrease the number and frequency of attacks. Steroids are often used for short-term relief. Physical and occupational therapy may also help. There are also many new alternative or complementary treatments available to help control the symptoms of MS. Ask your health care provider if any of these other options are right for you. °HOME CARE INSTRUCTIONS  °· Take medicines as directed by your health care provider. °· Exercise as directed by your health care provider. °SEEK MEDICAL CARE IF: °You begin to feel  depressed. °SEEK IMMEDIATE MEDICAL CARE IF: °· You develop paralysis. °· You have problems with bladder, bowel, or sexual function. °· You develop mental changes, such as forgetfulness or mood swings. °· You have a period of uncontrolled movements (seizure). °  °This information is not intended to replace advice given to you by your health care provider. Make sure you discuss any questions you have with your health care provider. °  °Document Released: 02/20/2000 Document Revised: 02/27/2013 Document Reviewed: 10/30/2012 °Elsevier Interactive Patient Education ©2016 [employer] Inc[employer] ° °

## 2015-03-13 ENCOUNTER — Telehealth: Payer: Self-pay | Admitting: Neurology

## 2015-03-13 NOTE — Telephone Encounter (Signed)
Ins has been contacted and provided with clinical info.  Request is being reviewed.  I called back.  Betaseron will monitor request through ins, and will call us back if anything further is needed.   [employer] has approved the request for coverage on Betaseron effective until 03/16/2017 Ref # [id number]

## 2015-03-13 NOTE — Telephone Encounter (Signed)
Beta Seron pt assistance called to see if a prior auth form was received from this office and they wanted to know if they needed to monitor getting a response back.  please call back [phone]

## 2015-04-18 ENCOUNTER — Telehealth: Payer: Self-pay | Admitting: Family Medicine

## 2015-04-18 NOTE — Telephone Encounter (Signed)
Patient Name: Karina [name]  DOB: [birth year]    Initial Comment Caller states she's having chest pain, shortness of breath and night sweats.   Nurse Assessment  Nurse: Raphael Gibney, RN, Vanita Ingles Date/Time (Eastern Time): 04/18/2015 11:54:22 AM  Confirm and document reason for call. If symptomatic, describe symptoms. You must click the next button to save text entered. ---Caller states she is having episodes of chest pain and she has SOB. She is having night sweats. She is fatigued. Had some heart blockages in the past. No chest pain now. She is SOB. No fever.  Has the patient traveled out of the country within the last 30 days? ---Not Applicable  Does the patient have any new or worsening symptoms? ---Yes  Will a triage be completed? ---Yes  Related visit to physician within the last 2 weeks? ---No  Does the PT have any chronic conditions? (i.e. diabetes, asthma, etc.) ---Yes  List chronic conditions. ---cardiac blockages in the past but it was not bad enough to treat; COPD  Is this a behavioral health or substance abuse call? ---No     Guidelines    Guideline Title Affirmed Question Affirmed Notes  Cough - Chronic [1] Increasing difficulty breathing AND [2] always has some difficulty breathing    Final Disposition User   Go to ED Now (or PCP triage) Raphael Gibney, RN, [place]    Comments  Pt states she has a chronic cough.   Referrals  [facility] - ED   Disagree/Comply: Comply

## 2015-04-18 NOTE — Telephone Encounter (Signed)
Agree with adv to go to ED 

## 2015-04-29 ENCOUNTER — Ambulatory Visit: Payer: Medicare HMO

## 2015-04-30 ENCOUNTER — Ambulatory Visit (INDEPENDENT_AMBULATORY_CARE_PROVIDER_SITE_OTHER): Payer: Commercial Managed Care - HMO

## 2015-04-30 VITALS — BP 128/90 | HR 74 | Temp 98.5°F | Ht 62.0 in | Wt 183.5 lb

## 2015-04-30 DIAGNOSIS — Z1159 Encounter for screening for other viral diseases: Secondary | ICD-10-CM | POA: Diagnosis not present

## 2015-04-30 DIAGNOSIS — Z114 Encounter for screening for human immunodeficiency virus [HIV]: Secondary | ICD-10-CM | POA: Diagnosis not present

## 2015-04-30 DIAGNOSIS — G35 Multiple sclerosis: Secondary | ICD-10-CM

## 2015-04-30 DIAGNOSIS — Z0000 Encounter for general adult medical examination without abnormal findings: Secondary | ICD-10-CM

## 2015-04-30 LAB — COMPREHENSIVE METABOLIC PANEL
ALT: 13 U/L (ref 0–35)
AST: 19 U/L (ref 0–37)
Albumin: 4.4 g/dL (ref 3.5–5.2)
Alkaline Phosphatase: 66 U/L (ref 39–117)
BUN: 12 mg/dL (ref 6–23)
CO2: 30 mEq/L (ref 19–32)
Calcium: 10.3 mg/dL (ref 8.4–10.5)
Chloride: 104 mEq/L (ref 96–112)
Creatinine, Ser: 1.05 mg/dL (ref 0.40–1.20)
GFR: 57.36 mL/min — ABNORMAL LOW (ref >60.00)
Glucose, Bld: 92 mg/dL (ref 70–99)
Potassium: 4.6 mEq/L (ref 3.5–5.1)
Sodium: 140 mEq/L (ref 135–145)
Total Bilirubin: 0.4 mg/dL (ref 0.2–1.2)
Total Protein: 7.4 g/dL (ref 6.0–8.3)

## 2015-04-30 LAB — LIPID PANEL
Cholesterol: 265 mg/dL — ABNORMAL HIGH (ref 0–200)
HDL: 69.2 mg/dL (ref >39.00)
LDL Cholesterol: 176 mg/dL — ABNORMAL HIGH (ref 0–99)
NonHDL: 195.61
Total CHOL/HDL Ratio: 4
Triglycerides: 98 mg/dL (ref 0.0–149.0)
VLDL: 19.6 mg/dL (ref 0.0–40.0)

## 2015-04-30 NOTE — Progress Notes (Signed)
Pre visit review using our clinic review tool, if applicable. No additional management support is needed unless otherwise documented below in the visit note. 

## 2015-04-30 NOTE — Patient Instructions (Addendum)
Karina [name] , Thank you for taking time to come for your Medicare Wellness Visit. I appreciate your ongoing commitment to your health goals. Please review the following plan we discussed and let me know if I can assist you in the future.   These are the goals we discussed: Goals    Goal is to lose weight when physically able to exercise without SOB, fatigue, and back pain.       This is a list of the screening recommended for you and due dates:  Health Maintenance  Topic Date Due  .  Hepatitis C: One time screening is recommended by Center for Disease Control  (CDC) for  adults born from [birth year] through 1963.   Completed  . HIV Screening  Completed  . Flu Shot  06/05/2016 - declined  . Pap Smear  02/01/2016  . Mammogram  05/29/2016  . Colon Cancer Screening  10/12/2018  . Tetanus Vaccine  02/01/2023  *Topic was postponed. The date shown is not the original due date.   Preventive Care for Adults  A healthy lifestyle and preventive care can promote health and wellness. Preventive health guidelines for adults include the following key practices.  . A routine yearly physical is a good way to check with your health care provider about your health and preventive screening. It is a chance to share any concerns and updates on your health and to receive a thorough exam.  . Visit your dentist for a routine exam and preventive care every 6 months. Brush your teeth twice a day and floss once a day. Good oral hygiene prevents tooth decay and gum disease.  . The frequency of eye exams is based on your age, health, family medical history, use  of contact lenses, and other factors. Follow your health care provider's ecommendations for frequency of eye exams.  . Eat a healthy diet. Foods like vegetables, fruits, whole grains, low-fat dairy products, and lean protein foods contain the nutrients you need without too many calories. Decrease your intake of foods high in solid fats, added sugars, and salt.  Eat the right amount of calories for you. Get information about a proper diet from your health care provider, if necessary.  . Regular physical exercise is one of the most important things you can do for your health. Most adults should get at least 150 minutes of moderate-intensity exercise (any activity that increases your heart rate and causes you to sweat) each week. In addition, most adults need muscle-strengthening exercises on 2 or more days a week.  . Maintain a healthy weight. The body mass index (BMI) is a screening tool to identify possible weight problems. It provides an estimate of body fat based on height and weight. Your health care provider can find your BMI and can help you achieve or maintain a healthy weight.   For adults 20 years and older: ? A BMI below 18.5 is considered underweight. ? A BMI of 18.5 to 24.9 is normal. ? A BMI of 25 to 29.9 is considered overweight. ? A BMI of 30 and above is considered obese.   . Maintain normal blood lipids and cholesterol levels by exercising and minimizing your intake of saturated fat. Eat a balanced diet with plenty of fruit and vegetables. Blood tests for lipids and cholesterol should begin at age 53 and be repeated every 5 years. If your lipid or cholesterol levels are high, you are over 50, or you are at high risk for heart disease, you may  need your cholesterol levels checked more frequently. Ongoing high lipid and cholesterol levels should be treated with medicines if diet and exercise are not working.  . If you smoke, find out from your health care provider how to quit. If you do not use tobacco, please do not start.  . If you choose to drink alcohol, please do not consume more than 2 drinks per day. One drink is considered to be 12 ounces (355 mL) of beer, 5 ounces (148 mL) of wine, or 1.5 ounces (44 mL) of liquor.  . If you are 68-4 years old, ask your health care provider if you should take aspirin to prevent strokes.  .  Osteoporosis is a disease in which the bones lose minerals and strength with aging. This can result in serious bone fractures or breaks. The risk of osteoporosis can be identified using a bone density scan. Women ages 58 years and over and women at risk for fractures or osteoporosis should discuss screening with their health care providers. Ask your health care provider whether you should take a calcium supplement or vitamin D to reduce the rate of osteoporosis.  . Menopause can be associated with physical symptoms and risks. Hormone replacement therapy is available to decrease symptoms and risks. You should talk to your health care provider about whether hormone replacement therapy is right for you.  . Use sunscreen. Apply sunscreen liberally and repeatedly throughout the day. You should seek shade when your shadow is shorter than you. Protect yourself by wearing long sleeves, pants, a wide-brimmed hat, and sunglasses year round, whenever you are outdoors.  . Once a month, do a whole body skin exam, using a mirror to look at the skin on your back. Tell your health care provider of new moles, moles that have irregular borders, moles that are larger than a pencil eraser, or moles that have changed in shape or color.

## 2015-04-30 NOTE — Progress Notes (Signed)
Subjective:   Karina [name] is a 61 y.o. female who presents for Medicare Annual (Subsequent) preventive examination.  Review of Systems:  No ROS  Cardiac Risk Factors include: sedentary lifestyle;dyslipidemia;obesity (BMI >30kg/m2)     Objective:     Vitals: BP 128/90 mmHg  Pulse 74  Temp(Src) 98.5 F (36.9 C) (Oral)  Ht 5\' 2"  (1.575 m)  Wt 183 lb 8 oz (83.235 kg)  BMI 33.55 kg/m2  SpO2 97%  Tobacco History  Smoking status  . Current Every Day Smoker -- 1.00 packs/day for 30 years  . Types: Cigarettes  Smokeless tobacco  . Never Used     Ready to quit: No Counseling given: No   Past Medical History  Diagnosis Date  . Vertigo   . GERD (gastroesophageal reflux disease)   . Seizure disorder ([place])     single seizure/hypoglycemia  . Urinary incontinence   . Plantar fasciitis   . Colon polyps 08/2008    Adenomatous  . Gastritis 08/2008    H. pylori  . Lung nodule   . Menopausal disorder   . Multiple sclerosis ([place])   . Allergic rhinitis   . Hypercholesteremia   . Diabetes mellitus   . COPD (chronic obstructive pulmonary disease) ([place])   . History of shingles     Left V1  . Allergy   . Anxiety     [facility]- [facility] [place], no per pt  . Depression     no per pt  . Cataract   . Neuromuscular disorder ([place])     Multiple sclerosis  . Seizures ([place])     1 seizure in 2001 due to blood sugar dropping to 38, none since   Past Surgical History  Procedure Laterality Date  . Cervical discectomy  2004    x 2, Anterior; Fusion C4-5, C5-6, C6-7, Dr. Kary Kos  . Tubal ligation    . Dilation and curettage of uterus    . Tonsillectomy    . Foot surgery      left plantar fascial problem  . Cholecystectomy    . Chest ct  11/2008    With small 4 mm nodule L lung base (rec re check in 1 year)  . Chest ct  07/2009    Re check chest CT - lung nodule stable  . Ct sinus ltd w/o cm  02/2009    negative  . Colonoscopy  08/2008    Polyps/ re check 5 yrs  .  Esophagogastroduodenoscopy  08/2008    Erosive gastritis, h pylori (treated)  . Rotator cuff repair      right  . Upper gastrointestinal endoscopy     Family History  Problem Relation Age of Onset  . Migraines Mother   . Heart attack Mother   . Coronary artery disease Mother   . Coronary artery disease Father     6 bypasses   . Stroke Father   . Diabetes Father   . Renal Disease Father   . Colon cancer Maternal Grandfather 46  . Coronary artery disease Cousin   . Coronary artery disease Paternal Grandmother   . Lung cancer Paternal Aunt   . Multiple sclerosis Neg Hx   . Esophageal cancer Neg Hx   . Rectal cancer Neg Hx   . Stomach cancer Neg Hx    History  Sexual Activity  . Sexual Activity: Yes    Outpatient Encounter Prescriptions as of 04/30/2015  Medication Sig  . albuterol (VENTOLIN HFA) 108 (90 BASE)  MCG/ACT inhaler INHALE 2 PUFFS UP TO EVERY 4 HOURS AS NEEDED FOR WHEEZING  . ALPRAZolam (XANAX) 0.5 MG tablet Take 1 tablet (0.5 mg total) by mouth 2 (two) times daily as needed. For anxiety.  Marland Kitchen azelastine (ASTELIN) 0.1 % nasal spray Place 2 sprays into both nostrils 2 (two) times daily as needed.  . budesonide-formoterol (SYMBICORT) 160-4.5 MCG/ACT inhaler Inhale 2 puffs into the lungs 2 (two) times daily.  . cefdinir (OMNICEF) 300 MG capsule Take 1 capsule (300 mg total) by mouth 2 (two) times daily.  . CRESTOR 20 MG tablet TAKE 1/2 TABLET BY MOUTH DAILY  . cyclobenzaprine (FLEXERIL) 10 MG tablet Take 1 tablet (10 mg total) by mouth 3 (three) times daily as needed for muscle spasms (watch out for sedation).  . furosemide (LASIX) 20 MG tablet Take 20 mg by mouth daily.  Marland Kitchen gabapentin (NEURONTIN) 300 MG capsule TAKE 2 CAPSULES (600 MG TOTAL) BY MOUTH AT BEDTIME.  Marland Kitchen nystatin (MYCOSTATIN) 100000 UNIT/ML suspension Take 5 mLs (500,000 Units total) by mouth 3 (three) times daily. Swish in mouth 30 seconds and then swallow  . omeprazole (PRILOSEC) 20 MG capsule Take 20 mg by  mouth daily.  . Probiotic Product (ALIGN PO) Take by mouth daily.   No facility-administered encounter medications on file as of 04/30/2015.    Activities of Daily Living In your present state of health, do you have any difficulty performing the following activities: 04/30/2015  Hearing? N  Vision? Y  Difficulty concentrating or making decisions? N  Walking or climbing stairs? N  Dressing or bathing? N  Doing errands, shopping? N  Preparing Food and eating ? N  Using the Toilet? N  In the past six months, have you accidently leaked urine? Y  Do you have problems with loss of bowel control? N  Managing your Medications? N  Managing your Finances? N  Housekeeping or managing your Housekeeping? N    Patient Care Team: Abner Greenspan, MD as PCP - General Kathrynn Ducking, MD as Consulting Physician (Neurology)  Tanda Rockers, MD as Consulting Physician (Cardiology)   Provider list updated Assessment:     Exercise Activities and Dietary recommendations Current Exercise Habits:: The patient does not participate in regular exercise at present  Goals    Pt desires to begin exercise program once shortness of breath, fatigue, and back pain are resolved.      Fall Risk Fall Risk  04/30/2015 01/31/2013  Falls in the past year? No Yes  Number falls in past yr: - 1  Injury with Fall? - Yes   Depression Screen PHQ 2/9 Scores 04/30/2015 01/31/2013  PHQ - 2 Score 0 2     Cognitive Testing MMSE - Mini Mental State Exam 04/30/2015  Orientation to time 5  Orientation to Place 5  Registration 3  Attention/ Calculation 5  Recall 3  Language- repeat 1  Language- follow 3 step command 3  Language- read & follow direction 1    Immunization History  Administered Date(s) Administered  . Influenza,inj,Quad PF,36+ Mos 01/31/2013, 03/11/2014  . Pneumococcal Polysaccharide-23 07/29/2003  . Td 03/08/2000  . Tdap 01/31/2013   Screening Tests Health Maintenance  Topic Date Due  .  Hepatitis C Screening  completed  . HIV Screening  completed  . INFLUENZA VACCINE  06/05/2016 (Originally 10/07/2014) - declined  . PAP SMEAR  02/01/2016  . MAMMOGRAM  05/29/2016  . COLONOSCOPY  10/12/2018  . TETANUS/TDAP  02/01/2023      Plan:  During the course of the visit the patient was educated and counseled about the following appropriate screening and preventive services:   Vaccines to include Influenza - declined   Vision screening - referral submitted   HIV screening - completed  Hep C screening - completed  Advanced directives - paperwork provided  General preventive health recommendations  Patient Instructions (the written plan) was given to the patient.   See attached scanned questionnaire for additional information.   Lindell Noe, LPN  [id number]

## 2015-04-30 NOTE — Progress Notes (Signed)
Patient concerns:  1. States generic of Crestor, rosuvastatin, is causing chest pain.Pt states Crestor did not cause chest pain.   2. Discussed GERD and how it is being managed. States OTC omeprazole is not effective. Suggested to pt this may be causing chest pain or discomfort. Encouraged pt to discuss concerns with PCP at CPE.  3.  Reports increase in shortness of breath, general fatigue, and night sweats. O2 sat - 97% on room air.  4. Nodules to bilateral legs in thigh region. Consulted with dermatology who provided steroid cream. Pt states this was not effective. Consulted with neurology who stated nodules are unrelated to dx of MS. Pt wants nodules biopsied. Encouraged pt to show PCP nodules during CPE.   5. Requested a referral to have breasts lifted because they are causing shortness of breath. Encouraged pt to discuss concerns with PCP at CPE.

## 2015-05-01 LAB — HIV ANTIBODY (ROUTINE TESTING W REFLEX): HIV 1&2 Ab, 4th Generation: NONREACTIVE

## 2015-05-01 LAB — TSH: TSH: 1.3 u[IU]/mL (ref 0.35–4.50)

## 2015-05-01 LAB — HEPATITIS C ANTIBODY: HCV Ab: NEGATIVE

## 2015-05-01 NOTE — Progress Notes (Signed)
   Subjective:    Patient ID: Karina [name], female    DOB: [birth year], 60 y.o.   MRN: [id number]  HPI    Review of Systems     Objective:   Physical Exam        Assessment & Plan:  I reviewed health advisor's note, was available for consultation, and agree with documentation and plan.

## 2015-05-06 ENCOUNTER — Other Ambulatory Visit (HOSPITAL_COMMUNITY)
Admission: RE | Admit: 2015-05-06 | Discharge: 2015-05-06 | Disposition: A | Discharge status: Home / Self Care | Payer: Commercial Managed Care - HMO | Source: Ambulatory Visit | Attending: Family Medicine | Admitting: Family Medicine

## 2015-05-06 ENCOUNTER — Ambulatory Visit (INDEPENDENT_AMBULATORY_CARE_PROVIDER_SITE_OTHER): Payer: Commercial Managed Care - HMO | Admitting: Family Medicine

## 2015-05-06 ENCOUNTER — Encounter: Payer: Self-pay | Admitting: Family Medicine

## 2015-05-06 VITALS — BP 106/78 | HR 75 | Temp 97.6°F | Ht 62.0 in | Wt 182.8 lb

## 2015-05-06 DIAGNOSIS — Z720 Tobacco use: Secondary | ICD-10-CM

## 2015-05-06 DIAGNOSIS — Z124 Encounter for screening for malignant neoplasm of cervix: Secondary | ICD-10-CM | POA: Diagnosis not present

## 2015-05-06 DIAGNOSIS — L989 Disorder of the skin and subcutaneous tissue, unspecified: Secondary | ICD-10-CM

## 2015-05-06 DIAGNOSIS — N6320 Unspecified lump in the left breast, unspecified quadrant: Secondary | ICD-10-CM | POA: Insufficient documentation

## 2015-05-06 DIAGNOSIS — E7800 Pure hypercholesterolemia, unspecified: Secondary | ICD-10-CM

## 2015-05-06 DIAGNOSIS — Z0000 Encounter for general adult medical examination without abnormal findings: Secondary | ICD-10-CM | POA: Diagnosis not present

## 2015-05-06 DIAGNOSIS — Z01419 Encounter for gynecological examination (general) (routine) without abnormal findings: Secondary | ICD-10-CM | POA: Diagnosis not present

## 2015-05-06 DIAGNOSIS — F17200 Nicotine dependence, unspecified, uncomplicated: Secondary | ICD-10-CM

## 2015-05-06 DIAGNOSIS — N63 Unspecified lump in breast: Secondary | ICD-10-CM

## 2015-05-06 DIAGNOSIS — K219 Gastro-esophageal reflux disease without esophagitis: Secondary | ICD-10-CM

## 2015-05-06 DIAGNOSIS — N62 Hypertrophy of breast: Secondary | ICD-10-CM

## 2015-05-06 MED ORDER — OMEPRAZOLE 40 MG PO CPDR: 40.0000 mg | DELAYED_RELEASE_CAPSULE | Freq: Every day | ORAL | Status: DC

## 2015-05-06 NOTE — Progress Notes (Signed)
Pre visit review using our clinic review tool, if applicable. No additional management support is needed unless otherwise documented below in the visit note. 

## 2015-05-06 NOTE — Assessment & Plan Note (Signed)
Pt inquires about poss of breast reduction in the future  This causes shoulder/neck pain and headaches as well as pressure on chest/sob  She will move forward with her dx mm and [place] first If all ok- will ref to plastic surgeon -she will call for that ref when ready

## 2015-05-06 NOTE — Assessment & Plan Note (Signed)
Due for 12 mo f/u dx mm and [place]  Ref done  No change on exam

## 2015-05-06 NOTE — Assessment & Plan Note (Signed)
Not adequately controlled with omeprazole 20-will bump to 40 and plan f/u  Disc diet and eating small portions  Will update

## 2015-05-06 NOTE — Assessment & Plan Note (Signed)
Routine pap/exam done  Cystocele noted  Recommend kegel exercises for stress incontinence

## 2015-05-06 NOTE — Assessment & Plan Note (Signed)
Generic crestor works well but pt thinks it causes cp  Chol up significantly off of it  Diet is fair  Disc goals for lipids and reasons to control them Rev labs with pt Rev low sat fat diet in detail She will address this at her cardiology visit in April

## 2015-05-06 NOTE — Assessment & Plan Note (Signed)
Disc in detail risks of smoking and possible outcomes including copd, vascular/ heart disease, cancer , respiratory and sinus infections  Pt voices understanding Happy to hear she is cutting down  Says she is not ready to quit, however

## 2015-05-06 NOTE — Assessment & Plan Note (Signed)
Reviewed her recent amw visit with Kristeen Miss Reviewed health habits including diet and exercise and skin cancer prevention Reviewed appropriate screening tests for age  Also reviewed health mt list, fam hx and immunization status , as well as social and family history    See HPI Labs reviewed Pap today Stop at check out for referral for dermatology and mammogram  Talk to the cardiologist if another trial of generic crestor is not tolerated - cholesterol is up as expected   (Avoid red meat/ fried foods/ egg yolks/ fatty breakfast meats/ butter, cheese and high fat dairy/ and shellfish) See neurology and cardiology as planned  Increase your omeprazole to 40 mg once daily for acid reflux -let me know if not helpful   In the future if you want a referral to plastic surgery for consideration of breast reduction - let's wait until after your mammogram and [place]

## 2015-05-06 NOTE — Progress Notes (Signed)
Subjective:    Patient ID: Karina [name], female    DOB: 06-24-1955, 60 y.o.   MRN: [id number]  HPI Here for health maintenance exam and to review chronic medical problems    Wt is down 1 lb with bmi of 33 This is stable  She tries to loose wt - does not eat much and cannot get much exercise   Declines flu vaccine - she wears a mask a lot or stays home to avoid exposure   Pap 11/14 nl with neg hpv Wants to get that done today   Screening for Hep c and hiv are negative   Nl mm 3/16- has it upcoming - does not have it scheduled yet - needs a referral (they were watching a cyst in her breast) -thinks she may do a bx this time as well - at the breast center  Self exam C/o breast heaviness- pulls on shoulders and heavy on her chest  D cup  Has neck and back problems Is interested in a breast reduction   Colonoscopy 8/15 adenoma- 5 year recall  She occ has muscle spasms in rectum (around the times of bms) bms are regular   Td 11/14   Hyperlipidemia Having chest pain from generic crestor (not the name brand) Lab Results  Component Value Date   CHOL 265* 04/30/2015   CHOL 276* 01/24/2013   CHOL 159 11/19/2011   Lab Results  Component Value Date   HDL 69.20 04/30/2015   HDL 57.90 01/24/2013   HDL 67 11/19/2011   Lab Results  Component Value Date   LDLCALC 176* 04/30/2015   LDLCALC 77 11/19/2011   LDLCALC 73 06/04/2010   Lab Results  Component Value Date   TRIG 98.0 04/30/2015   TRIG 107.0 01/24/2013   TRIG 76 11/19/2011   Lab Results  Component Value Date   CHOLHDL 4 04/30/2015   CHOLHDL 5 01/24/2013   CHOLHDL 2.4 11/19/2011   Lab Results  Component Value Date   LDLDIRECT 206.5 01/24/2013   LDLDIRECT 176.9 04/20/2010  off crestor (generic ) LDL is way up  She may try going back on it - if cp comes back she will know that is the cause  Has appt with cardiology in April  Smoking status-was able to cut back to less than a pack per day  Not ready to  quit-taking it day by day   MS Has f/u with neuro in April   GERD- omeprazole 20 mg  Wants to try omep 40 mg px  regurg food and acid as well   Has some nodules in her legs - neuro said nothing to do with ms  Steroid cream did not help from derm  She is interested in biopsy   Results for orders placed or performed in visit on 04/30/15  Comprehensive metabolic panel  Result Value Ref Range   Sodium 140 135 - 145 mEq/L   Potassium 4.6 3.5 - 5.1 mEq/L   Chloride 104 96 - 112 mEq/L   CO2 30 19 - 32 mEq/L   Glucose, Bld 92 70 - 99 mg/dL   BUN 12 6 - 23 mg/dL   Creatinine, Ser 1.05 0.40 - 1.20 mg/dL   Total Bilirubin 0.4 0.2 - 1.2 mg/dL   Alkaline Phosphatase 66 39 - 117 U/L   AST 19 0 - 37 U/L   ALT 13 0 - 35 U/L   Total Protein 7.4 6.0 - 8.3 g/dL   Albumin 4.4 3.5 - 5.2  g/dL   Calcium 10.3 8.4 - 10.5 mg/dL   GFR 57.36 (L) >60.00 mL/min  Lipid Panel  Result Value Ref Range   Cholesterol 265 (H) 0 - 200 mg/dL   Triglycerides 98.0 0.0 - 149.0 mg/dL   HDL 69.20 >39.00 mg/dL   VLDL 19.6 0.0 - 40.0 mg/dL   LDL Cholesterol 176 (H) 0 - 99 mg/dL   Total CHOL/HDL Ratio 4    NonHDL 195.61   Hepatitis C Antibody  Result Value Ref Range   HCV Ab NEGATIVE NEGATIVE  HIV antibody (with reflex)  Result Value Ref Range   HIV 1&2 Ab, 4th Generation NONREACTIVE NONREACTIVE  TSH  Result Value Ref Range   TSH 1.30 0.35 - 4.50 uIU/mL      Patient Active Problem List   Diagnosis Date Noted  . Routine general medical examination at a health care facility 05/06/2015  . Lump of breast, left 05/06/2015  . Skin lesions 05/06/2015  . Large breasts 05/06/2015  . Acute bronchitis with bronchospasm 02/11/2015  . Oral thrush 11/01/2014  . Lumbar disc disease 11/01/2014  . Pain in left hip 11/01/2014  . Knee pain, bilateral 03/11/2014  . Yeast vaginitis 11/30/2013  . Thoracic back pain 11/30/2013  . Vulvar boil 11/30/2013  . Infectious mononucleosis 07/11/2013  . Encounter for routine  gynecological examination 01/31/2013  . Encounter for Medicare annual wellness exam 01/23/2013  . Chest pain 09/20/2012  . SOB (shortness of breath) 09/20/2012  . Dyspnea 09/13/2012  . Asthmatic bronchitis , chronic ([place]) 12/06/2011  . Chronic cough 11/22/2011  . OTHER DISORDERS OF EYELID 04/02/2010  . ANXIETY, SITUATIONAL 07/17/2009  . Lumbar pain 12/27/2008  . HEMORRHOIDS-EXTERNAL 11/07/2008  . IRRITABLE BOWEL SYNDROME 11/07/2008  . LUNG NODULE 10/14/2008  . HYPERGLYCEMIA, BORDERLINE 10/14/2008  . PERSONAL HX COLONIC POLYPS 10/08/2008  . UNSPECIFIED VITAMIN D DEFICIENCY 07/23/2008  . SYNCOPE, HX OF 06/01/2008  . FOOT SURGERY, HX OF 06/01/2008  . DILATION AND CURETTAGE, HX OF 06/01/2008  . OSTEOARTHRITIS, SHOULDER, RIGHT 03/22/2008  . ROTATOR CUFF SYNDROME, RIGHT 03/22/2008  . ARTHRALGIA 12/12/2007  . Allergic rhinitis 08/01/2007  . Smoker 10/17/2006  . NEUROPATHY-PERIPHERAL 10/17/2006  . FOOT PAIN, BILATERAL 10/17/2006  . SYMPTOM, EDEMA 10/03/2006  . HYPOGLYCEMIA 09/30/2006  . HYPERCHOLESTEROLEMIA 09/30/2006  . MULTIPLE SCLEROSIS 09/30/2006  . GERD 09/30/2006  . FIBROCYSTIC BREAST DISEASE 09/30/2006  . SEIZURE DISORDER 09/30/2006  . DIZZINESS OR VERTIGO 09/30/2006  . URINARY INCONTINENCE 09/30/2006   Past Medical History  Diagnosis Date  . Vertigo   . GERD (gastroesophageal reflux disease)   . Seizure disorder ([place])     single seizure/hypoglycemia  . Urinary incontinence   . Plantar fasciitis   . Colon polyps 08/2008    Adenomatous  . Gastritis 08/2008    H. pylori  . Lung nodule   . Menopausal disorder   . Multiple sclerosis ([place])   . Allergic rhinitis   . Hypercholesteremia   . Diabetes mellitus   . COPD (chronic obstructive pulmonary disease) ([place])   . History of shingles     Left V1  . Allergy   . Anxiety     [facility] [place], no per pt  . Depression     no per pt  . Cataract   . Neuromuscular disorder ([place])     Multiple sclerosis  .  Seizures ([place])     1 seizure in 2001 due to blood sugar dropping to 38, none since   Past Surgical History  Procedure Laterality  Date  . Cervical discectomy  2004    x 2, Anterior; Fusion C4-5, C5-6, C6-7, Dr. Kary Kos  . Tubal ligation    . Dilation and curettage of uterus    . Tonsillectomy    . Foot surgery      left plantar fascial problem  . Cholecystectomy    . Chest ct  11/2008    With small 4 mm nodule L lung base (rec re check in 1 year)  . Chest ct  07/2009    Re check chest CT - lung nodule stable  . Ct sinus ltd w/o cm  02/2009    negative  . Colonoscopy  08/2008    Polyps/ re check 5 yrs  . Esophagogastroduodenoscopy  08/2008    Erosive gastritis, h pylori (treated)  . Rotator cuff repair      right  . Upper gastrointestinal endoscopy     Social History  Substance Use Topics  . Smoking status: Current Every Day Smoker -- 1.00 packs/day for 30 years    Types: Cigarettes  . Smokeless tobacco: Never Used  . Alcohol Use: No   Family History  Problem Relation Age of Onset  . Migraines Mother   . Heart attack Mother   . Coronary artery disease Mother   . Coronary artery disease Father     6 bypasses   . Stroke Father   . Diabetes Father   . Renal Disease Father   . Colon cancer Maternal Grandfather 38  . Coronary artery disease Cousin   . Coronary artery disease Paternal Grandmother   . Lung cancer Paternal Aunt   . Multiple sclerosis Neg Hx   . Esophageal cancer Neg Hx   . Rectal cancer Neg Hx   . Stomach cancer Neg Hx    Allergies  Allergen Reactions  . Percocet [Oxycodone-Acetaminophen] Shortness Of Breath    Felt like going to pass out and sweating  . Tecfidera [Dimethyl Fumarate] Shortness Of Breath, Palpitations, Rash and Cough  . Amitriptyline Hcl     REACTION: swelling and itching  . Atorvastatin     REACTION: elevated LFT's  . Betaseron [Interferon Beta-1b]     Headache  . Clarithromycin     REACTION: reaction not known  . Duloxetine      REACTION: pain  . Levofloxacin     REACTION: Rash  . Pregabalin     REACTION: LE swelling  . Pseudoephedrine     REACTION: legs hurt  . Zoloft [Sertraline Hcl] Other (See Comments)    Headaches    Current Outpatient Prescriptions on File Prior to Visit  Medication Sig Dispense Refill  . albuterol (VENTOLIN HFA) 108 (90 BASE) MCG/ACT inhaler INHALE 2 PUFFS UP TO EVERY 4 HOURS AS NEEDED FOR WHEEZING 1 Inhaler 11  . ALPRAZolam (XANAX) 0.5 MG tablet Take 1 tablet (0.5 mg total) by mouth 2 (two) times daily as needed. For anxiety. 30 tablet 0  . azelastine (ASTELIN) 0.1 % nasal spray Place 2 sprays into both nostrils 2 (two) times daily as needed.  12  . budesonide-formoterol (SYMBICORT) 160-4.5 MCG/ACT inhaler Inhale 2 puffs into the lungs 2 (two) times daily. 1 Inhaler 11  . CRESTOR 20 MG tablet TAKE 1/2 TABLET BY MOUTH DAILY 15 tablet 5  . gabapentin (NEURONTIN) 300 MG capsule TAKE 2 CAPSULES (600 MG TOTAL) BY MOUTH AT BEDTIME. 180 capsule 0  . Probiotic Product (ALIGN PO) Take by mouth daily.     No current facility-administered medications on  file prior to visit.     Review of Systems Review of Systems  Constitutional: Negative for fever, appetite change,  and unexpected weight change. pos for fatigue and poor sleep Eyes: Negative for pain and visual disturbance.  Respiratory: Negative for cough and pos for chronic shortness of breath.   Cardiovascular: Negative for cp or palpitations   (had cp on generic crestor that is now gone) Gastrointestinal: Negative for nausea, diarrhea and constipation. pos for occ rectal spasms with bm Genitourinary: Negative for urgency and frequency. pos for stress incontinence  Skin: Negative for pallor or rash  pos for lesions on legs that hurt  Neurological: Negative for weakness, light-headedness, numbness and pos for headaches. pos for MS Hematological: Negative for adenopathy. Does not bruise/bleed easily.  Psychiatric/Behavioral: Negative for  dysphoric mood. The patient is nervous/anxious.  (but has not needed xanax in 2 wk) , pos for stressors       Objective:   Physical Exam  Constitutional: She appears well-developed and well-nourished. No distress.  obese and well appearing   HENT:  Head: Normocephalic and atraumatic.  Right Ear: External ear normal.  Left Ear: External ear normal.  Mouth/Throat: Oropharynx is clear and moist.  Eyes: Conjunctivae and EOM are normal. Pupils are equal, round, and reactive to light. No scleral icterus.  Neck: Normal range of motion. Neck supple. No JVD present. Carotid bruit is not present. No thyromegaly present.  Cardiovascular: Normal rate, regular rhythm, normal heart sounds and intact distal pulses.  Exam reveals no gallop.   Pulmonary/Chest: Effort normal and breath sounds normal. No respiratory distress. She has no wheezes. She exhibits no tenderness.  Diffusely distant bs   Abdominal: Soft. Bowel sounds are normal. She exhibits no distension, no abdominal bruit and no mass. There is no tenderness.  Genitourinary: Vagina normal and uterus normal. No breast swelling, tenderness, discharge or bleeding. There is no rash, tenderness or lesion on the right labia. There is no rash, tenderness or lesion on the left labia. Uterus is not enlarged and not tender. Cervix exhibits no motion tenderness, no discharge and no friability. Right adnexum displays no mass, no tenderness and no fullness. Left adnexum displays no mass, no tenderness and no fullness. No bleeding in the vagina. No vaginal discharge found.  Breast exam: No mass, nodules, thickening, bulging, retraction, inflamation, nipple discharge or skin changes noted.  No axillary or clavicular LA. Large pendulous breasts bilat Shoulder divots from bra straps noted      Mild cystocele noted    Musculoskeletal: Normal range of motion. She exhibits no edema or tenderness.  Lymphadenopathy:    She has no cervical adenopathy.  Neurological:  She is alert. She has normal reflexes. No cranial nerve deficit. She exhibits normal muscle tone. Coordination normal.  Skin: Skin is warm and dry. No rash noted. No erythema. No pallor.  Some areas of scar tissue under old injection sites in buttocks that are tender but not red  Few scabbed papules on upper legs  Solar aging/ lentigo diffusely  Psychiatric: She has a normal mood and affect.  Fatigued appearing           Assessment & Plan:   Problem List Items Addressed This Visit      Digestive   GERD    Not adequately controlled with omeprazole 20-will bump to 40 and plan f/u  Disc diet and eating small portions  Will update       Relevant Medications   omeprazole (PRILOSEC) 40 MG  capsule     Musculoskeletal and Integument   Skin lesions    Pt has 2 issues - acute on chronic  First is skin lesions that appear on legs- red/scaley that scab up (no imp with steroid cream from derm in the past)  2nd are bigger knots on buttocks from prev inj of MS med   Pt inquires about ? Poss of removal or skin bx  Will ref to derm      Relevant Orders   Ambulatory referral to Dermatology     Other   Encounter for routine gynecological examination    Routine pap/exam done  Cystocele noted  Recommend kegel exercises for stress incontinence       Relevant Orders   Cytology - PAP   HYPERCHOLESTEROLEMIA - Primary    Generic crestor works well but pt thinks it causes cp  Chol up significantly off of it  Diet is fair  Disc goals for lipids and reasons to control them Rev labs with pt Rev low sat fat diet in detail She will address this at her cardiology visit in April      Large breasts    Pt inquires about poss of breast reduction in the future  This causes shoulder/neck pain and headaches as well as pressure on chest/sob  She will move forward with her dx mm and [place] first If all ok- will ref to plastic surgeon -she will call for that ref when ready      Lump of breast,  left    Due for 12 mo f/u dx mm and [place]  Ref done  No change on exam      Relevant Orders   MM Digital Diagnostic Bilat   US BREAST LTD UNI LEFT INC AXILLA   Routine general medical examination at a health care facility    Reviewed her recent amw visit with [facility] Reviewed health habits including diet and exercise and skin cancer prevention Reviewed appropriate screening tests for age  Also reviewed health mt list, fam hx and immunization status , as well as social and family history    See HPI Labs reviewed Pap today Stop at check out for referral for dermatology and mammogram  Talk to the cardiologist if another trial of generic crestor is not tolerated - cholesterol is up as expected   (Avoid red meat/ fried foods/ egg yolks/ fatty breakfast meats/ butter, cheese and high fat dairy/ and shellfish) See neurology and cardiology as planned  Increase your omeprazole to 40 mg once daily for acid reflux -let me know if not helpful   In the future if you want a referral to plastic surgery for consideration of breast reduction - let's wait until after your mammogram and [place]       Smoker    Disc in detail risks of smoking and possible outcomes including copd, vascular/ heart disease, cancer , respiratory and sinus infections  Pt voices understanding Happy to hear she is cutting down  Says she is not ready to quit, however

## 2015-05-06 NOTE — Patient Instructions (Addendum)
Pap today Stop at check out for referral for dermatology and mammogram  Talk to the cardiologist if another trial of generic crestor is not tolerated - cholesterol is up as expected   (Avoid red meat/ fried foods/ egg yolks/ fatty breakfast meats/ butter, cheese and high fat dairy/ and shellfish) See neurology and cardiology as planned  Increase your omeprazole to 40 mg once daily for acid reflux -let me know if not helpful   In the future if you want a referral to plastic surgery for consideration of breast reduction - let's wait until after your mammogram and [place]

## 2015-05-06 NOTE — Assessment & Plan Note (Signed)
Pt has 2 issues - acute on chronic  First is skin lesions that appear on legs- red/scaley that scab up (no imp with steroid cream from derm in the past)  2nd are bigger knots on buttocks from prev inj of MS med   Pt inquires about ? Poss of removal or skin bx  Will ref to derm

## 2015-05-07 ENCOUNTER — Telehealth: Payer: Self-pay | Admitting: Family Medicine

## 2015-05-07 DIAGNOSIS — N6320 Unspecified lump in the left breast, unspecified quadrant: Secondary | ICD-10-CM

## 2015-05-07 NOTE — Telephone Encounter (Signed)
That order is done  Please cancel the other order if you can - epic won't let me because "the appt has already been made" Thanks

## 2015-05-07 NOTE — Telephone Encounter (Signed)
Karina [name] made appointmnet

## 2015-05-07 NOTE — Telephone Encounter (Signed)
Dr Glori Bickers [facility] to [facility] and ultra sound.  They said the mammogram appointment needs to be changed to [phone] before they will schedule Thanks Shirlean Mylar

## 2015-05-08 LAB — CYTOLOGY - PAP

## 2015-05-30 ENCOUNTER — Other Ambulatory Visit: Payer: Self-pay | Admitting: Neurology

## 2015-06-02 ENCOUNTER — Ambulatory Visit
Admission: RE | Admit: 2015-06-02 | Discharge: 2015-06-02 | Disposition: A | Discharge status: Home / Self Care | Payer: Commercial Managed Care - HMO | Source: Ambulatory Visit | Attending: Family Medicine | Admitting: Family Medicine

## 2015-06-02 DIAGNOSIS — N63 Unspecified lump in breast: Secondary | ICD-10-CM | POA: Diagnosis not present

## 2015-06-02 DIAGNOSIS — N6320 Unspecified lump in the left breast, unspecified quadrant: Secondary | ICD-10-CM

## 2015-06-09 DIAGNOSIS — G35 Multiple sclerosis: Secondary | ICD-10-CM | POA: Diagnosis not present

## 2015-06-13 ENCOUNTER — Encounter: Payer: Self-pay | Admitting: Cardiovascular Disease

## 2015-06-13 ENCOUNTER — Ambulatory Visit (INDEPENDENT_AMBULATORY_CARE_PROVIDER_SITE_OTHER): Payer: Commercial Managed Care - HMO | Admitting: Cardiovascular Disease

## 2015-06-13 ENCOUNTER — Telehealth: Payer: Self-pay

## 2015-06-13 VITALS — BP 120/84 | HR 75 | Ht 62.0 in | Wt 185.1 lb

## 2015-06-13 DIAGNOSIS — R0602 Shortness of breath: Secondary | ICD-10-CM

## 2015-06-13 DIAGNOSIS — I209 Angina pectoris, unspecified: Secondary | ICD-10-CM | POA: Diagnosis not present

## 2015-06-13 DIAGNOSIS — F17200 Nicotine dependence, unspecified, uncomplicated: Secondary | ICD-10-CM

## 2015-06-13 DIAGNOSIS — R0600 Dyspnea, unspecified: Secondary | ICD-10-CM

## 2015-06-13 DIAGNOSIS — Z720 Tobacco use: Secondary | ICD-10-CM

## 2015-06-13 MED ORDER — CRESTOR 20 MG PO TABS: 20.0000 mg | ORAL_TABLET | Freq: Every day | ORAL | Status: DC

## 2015-06-13 NOTE — Assessment & Plan Note (Signed)
We have encouraged her to continue to work on weaning her cigarettes and smoking cessation. She will continue to work on this and does not want any assistance with chantix.  

## 2015-06-13 NOTE — Telephone Encounter (Signed)
Prior Authorization sent by covermymeds for approval for brand name Crestor 20 mg. Awaiting for approval.

## 2015-06-13 NOTE — Patient Instructions (Addendum)
You are doing well. No medication changes were made.  Please retry the crestor 1/2 daily  We will schedule a lexiscan myoview stress test for chest pain, shortness of breath  [facility] MYOVIEW  Your caregiver has ordered a Stress Test with nuclear imaging. The purpose of this test is to evaluate the blood supply to your heart muscle. This procedure is referred to as a "Non-Invasive Stress Test." This is because other than having an IV started in your vein, nothing is inserted or "invades" your body. Cardiac stress tests are done to find areas of poor blood flow to the heart by determining the extent of coronary artery disease (CAD). Some patients exercise on a treadmill, which naturally increases the blood flow to your heart, while others who are  unable to walk on a treadmill due to physical limitations have a pharmacologic/chemical stress agent called Lexiscan . This medicine will mimic walking on a treadmill by temporarily increasing your coronary blood flow.   Please note: these test may take anywhere between 2-4 hours to complete  PLEASE REPORT TO [place] AT THE FIRST DESK WILL DIRECT YOU WHERE TO GO  Date of Procedure:_____Wednesday, April 12________________  Arrival Time for Procedure:_____7:45 am_____________________  How to prepare for your Myoview test:   Do not eat or drink after midnight  No caffeine for 24 hours prior to test  No smoking 24 hours prior to test.  Your medication may be taken with water.  If your doctor stopped a medication because of this test, do not take that medication.  Ladies, please do not wear dresses.  Skirts or pants are appropriate. Please wear a short sleeve shirt.  No perfume, cologne or lotion.  Please call us if you have new issues that need to be addressed before your next appt.  Your physician wants you to follow-up in: 6 months.  You will receive a reminder letter in the mail two months in advance. If  you don't receive a letter, please call our office to schedule the follow-up appointment.  Cardiac Nuclear Scanning A cardiac nuclear scan is used to check your heart for problems, such as the following:  A portion of the heart is not getting enough blood.  Part of the heart muscle has died, which happens with a heart attack.  The heart wall is not working normally.  In this test, a radioactive dye (tracer) is injected into your bloodstream. After the tracer has traveled to your heart, a scanning device is used to measure how much of the tracer is absorbed by or distributed to various areas of your heart. LET [facility] CARE PROVIDER KNOW ABOUT:  Any allergies you have.  All medicines you are taking, including vitamins, herbs, eye drops, creams, and over-the-counter medicines.  Previous problems you or members of your family have had with the use of anesthetics.  Any blood disorders you have.  Previous surgeries you have had.  Medical conditions you have.  RISKS AND COMPLICATIONS Generally, this is a safe procedure. However, as with any procedure, problems can occur. Possible problems include:   Serious chest pain.  Rapid heartbeat.  Sensation of warmth in your chest. This usually passes quickly. BEFORE THE PROCEDURE Ask your health care provider about changing or stopping your regular medicines. PROCEDURE This procedure is usually done at a hospital and takes 2-4 hours.  An IV tube is inserted into one of your veins.  Your health care provider will inject a small amount  of radioactive tracer through the tube.  You will then wait for 20-40 minutes while the tracer travels through your bloodstream.  You will lie down on an exam table so images of your heart can be taken. Images will be taken for about 15-20 minutes.  You will exercise on a treadmill or stationary bike. While you exercise, your heart activity will be monitored with an electrocardiogram (ECG), and your  blood pressure will be checked.  If you are unable to exercise, you may be given a medicine to make your heart beat faster.  When blood flow to your heart has peaked, tracer will again be injected through the IV tube.  After 20-40 minutes, you will get back on the exam table and have more images taken of your heart.  When the procedure is over, your IV tube will be removed. AFTER THE PROCEDURE  You will likely be able to leave shortly after the test. Unless your health care provider tells you otherwise, you may return to your normal schedule, including diet, activities, and medicines.  Make sure you find out how and when you will get your test results.   This information is not intended to replace advice given to you by your health care provider. Make sure you discuss any questions you have with your health care provider.   Document Released: 03/19/2004 Document Revised: 02/27/2013 Document Reviewed: 01/31/2013 Elsevier Interactive Patient Education 2016 [employer]. Steps to Quit Smoking  Smoking tobacco can be harmful to your health and can affect almost every organ in your body. Smoking puts you, and those around you, at risk for developing many serious chronic diseases. Quitting smoking is difficult, but it is one of the best things that you can do for your health. It is never too late to quit. WHAT ARE THE BENEFITS OF QUITTING SMOKING? When you quit smoking, you lower your risk of developing serious diseases and conditions, such as:  Lung cancer or lung disease, such as COPD.  Heart disease.  Stroke.  Heart attack.  Infertility.  Osteoporosis and bone fractures. Additionally, symptoms such as coughing, wheezing, and shortness of breath may get better when you quit. You may also find that you get sick less often because your body is stronger at fighting off colds and infections. If you are pregnant, quitting smoking can help to reduce your chances of having a baby of low  birth weight. HOW DO I GET READY TO QUIT? When you decide to quit smoking, create a plan to make sure that you are successful. Before you quit:  Pick a date to quit. Set a date within the next two weeks to give you time to prepare.  Write down the reasons why you are quitting. Keep this list in places where you will see it often, such as on your bathroom mirror or in your car or wallet.  Identify the people, places, things, and activities that make you want to smoke (triggers) and avoid them. Make sure to take these actions:  Throw away all cigarettes at home, at work, and in your car.  Throw away smoking accessories, such as Scientist, research (medical).  Clean your car and make sure to empty the ashtray.  Clean your home, including curtains and carpets.  Tell your family, friends, and coworkers that you are quitting. Support from your loved ones can make quitting easier.  Talk with your health care provider about your options for quitting smoking.  Find out what treatment options are covered by your  health insurance. WHAT STRATEGIES CAN I USE TO QUIT SMOKING?  Talk with your healthcare provider about different strategies to quit smoking. Some strategies include:  Quitting smoking altogether instead of gradually lessening how much you smoke over a period of time. Research shows that quitting "cold [place]" is more successful than gradually quitting.  Attending in-person counseling to help you build problem-solving skills. You are more likely to have success in quitting if you attend several counseling sessions. Even short sessions of 10 minutes can be effective.  Finding resources and support systems that can help you to quit smoking and remain smoke-free after you quit. These resources are most helpful when you use them often. They can include:  Online chats with a Social worker.  Telephone quitlines.  Printed Furniture conservator/restorer.  Support groups or group counseling.  Text messaging  programs.  Mobile phone applications.  Taking medicines to help you quit smoking. (If you are pregnant or breastfeeding, talk with your health care provider first.) Some medicines contain nicotine and some do not. Both types of medicines help with cravings, but the medicines that include nicotine help to relieve withdrawal symptoms. Your health care provider may recommend:  Nicotine patches, gum, or lozenges.  Nicotine inhalers or sprays.  Non-nicotine medicine that is taken by mouth. Talk with your health care provider about combining strategies, such as taking medicines while you are also receiving in-person counseling. Using these two strategies together makes you more likely to succeed in quitting than if you used either strategy on its own. If you are pregnant or breastfeeding, talk with your health care provider about finding counseling or other support strategies to quit smoking. Do not take medicine to help you quit smoking unless told to do so by your health care provider. WHAT THINGS CAN I DO TO MAKE IT EASIER TO QUIT? Quitting smoking might feel overwhelming at first, but there is a lot that you can do to make it easier. Take these important actions:  Reach out to your family and friends and ask that they support and encourage you during this time. Call telephone quitlines, reach out to support groups, or work with a counselor for support.  Ask people who smoke to avoid smoking around you.  Avoid places that trigger you to smoke, such as bars, parties, or smoke-break areas at work.  Spend time around people who do not smoke.  Lessen stress in your life, because stress can be a smoking trigger for some people. To lessen stress, try:  Exercising regularly.  Deep-breathing exercises.  Yoga.  Meditating.  Performing a body scan. This involves closing your eyes, scanning your body from head to toe, and noticing which parts of your body are particularly tense. Purposefully  relax the muscles in those areas.  Download or purchase mobile phone or tablet apps (applications) that can help you stick to your quit plan by providing reminders, tips, and encouragement. There are many free apps, such as QuitGuide from the [employer] Office manager for Disease Control and Prevention). You can find other support for quitting smoking (smoking cessation) through smokefree.gov and other websites. HOW WILL I FEEL WHEN I QUIT SMOKING? Within the first 24 hours of quitting smoking, you may start to feel some withdrawal symptoms. These symptoms are usually most noticeable 2-3 days after quitting, but they usually do not last beyond 2-3 weeks. Changes or symptoms that you might experience include:  Mood swings.  Restlessness, anxiety, or irritation.  Difficulty concentrating.  Dizziness.  Strong cravings for sugary foods  in addition to nicotine.  Mild weight gain.  Constipation.  Nausea.  Coughing or a sore throat.  Changes in how your medicines work in your body.  A depressed mood.  Difficulty sleeping (insomnia). After the first 2-3 weeks of quitting, you may start to notice more positive results, such as:  Improved sense of smell and taste.  Decreased coughing and sore throat.  Slower heart rate.  Lower blood pressure.  Clearer skin.  The ability to breathe more easily.  Fewer sick days. Quitting smoking is very challenging for most people. Do not get discouraged if you are not successful the first time. Some people need to make many attempts to quit before they achieve long-term success. Do your best to stick to your quit plan, and talk with your health care provider if you have any questions or concerns.   This information is not intended to replace advice given to you by your health care provider. Make sure you discuss any questions you have with your health care provider.   Document Released: 02/16/2001 Document Revised: 07/09/2014 Document Reviewed:  07/09/2014 Elsevier Interactive Patient Education 2016 [employer]. Smoking Hazards Smoking cigarettes is extremely bad for your health. Tobacco smoke has over 200 known poisons in it. It contains the poisonous gases nitrogen oxide and carbon monoxide. There are over 60 chemicals in tobacco smoke that cause cancer. Some of the chemicals found in cigarette smoke include:   Cyanide.   Benzene.   Formaldehyde.   Methanol (wood alcohol).   Acetylene (fuel used in welding torches).   Ammonia.  Even smoking lightly shortens your life expectancy by several years. You can greatly reduce the risk of medical problems for you and your family by stopping now. Smoking is the most preventable cause of death and disease in our society. Within days of quitting smoking, your circulation improves, you decrease the risk of having a heart attack, and your lung capacity improves. There may be some increased phlegm in the first few days after quitting, and it may take months for your lungs to clear up completely. Quitting for 10 years reduces your risk of developing lung cancer to almost that of a nonsmoker.  WHAT ARE THE RISKS OF SMOKING? Cigarette smokers have an increased risk of many serious medical problems, including:  Lung cancer.   Lung disease (such as pneumonia, bronchitis, and emphysema).   Heart attack and chest pain due to the heart not getting enough oxygen (angina).   Heart disease and peripheral blood vessel disease.   Hypertension.   Stroke.   Oral cancer (cancer of the lip, mouth, or voice box).   Bladder cancer.   Pancreatic cancer.   Cervical cancer.   Pregnancy complications, including premature birth.   Stillbirths and smaller newborn babies, birth defects, and genetic damage to sperm.   Early menopause.   Lower estrogen level for women.   Infertility.   Facial wrinkles.   Blindness.   Increased risk of broken bones (fractures).    Senile dementia.   Stomach ulcers and internal bleeding.   Delayed wound healing and increased risk of complications during surgery. Because of secondhand smoke exposure, children of smokers have an increased risk of the following:   Sudden infant death syndrome (SIDS).   Respiratory infections.   Lung cancer.   Heart disease.   Ear infections.  WHY IS SMOKING ADDICTIVE? Nicotine is the chemical agent in tobacco that is capable of causing addiction or dependence. When you smoke and inhale, nicotine is  absorbed rapidly into the bloodstream through your lungs. Both inhaled and noninhaled nicotine may be addictive.  WHAT ARE THE BENEFITS OF QUITTING?  There are many health benefits to quitting smoking. Some are:   The likelihood of developing cancer and heart disease decreases. Health improvements are seen almost immediately.   Blood pressure, pulse rate, and breathing patterns start returning to normal soon after quitting.   People who quit may see an improvement in their overall quality of life.  HOW DO YOU QUIT SMOKING? Smoking is an addiction with both physical and psychological effects, and longtime habits can be hard to change. Your health care provider can recommend:  Programs and community resources, which may include group support, education, or therapy.  Replacement products, such as patches, gum, and nasal sprays. Use these products only as directed. Do not replace cigarette smoking with electronic cigarettes (commonly called e-cigarettes). The safety of e-cigarettes is unknown, and some may contain harmful chemicals. FOR MORE INFORMATION  American Lung Association: www.lung.org  American Cancer Society: www.cancer.org   This information is not intended to replace advice given to you by your health care provider. Make sure you discuss any questions you have with your health care provider.   Document Released: 04/01/2004 Document Revised: 12/13/2012  Document Reviewed: 08/14/2012 Elsevier Interactive Patient Education 2016 [place] WHAT IS SECONDHAND SMOKE? Secondhand smoke is smoke that comes from burning tobacco. It could be the smoke from a cigarette, a pipe, or a cigar. Even if you are not the one smoking, secondhand smoke exposes you to the dangers of smoking. This is called involuntary, or passive, smoking. There are two types of secondhand smoke:  Sidestream smoke is the smoke that comes off the lighted end of a cigarette, pipe, or cigar.  This type of smoke has the highest amount of cancer-causing agents (carcinogens).  The particles in sidestream smoke are smaller. They get into your lungs more easily.  Mainstream smoke is the smoke that is exhaled by a person who is smoking.  This type of smoke is also dangerous to your health. HOW CAN SECONDHAND SMOKE AFFECT MY HEALTH? Studies show that there is no safe level of secondhand smoke. This smoke contains thousands of chemicals. At least 57 of them are known to cause cancer. Secondhand smoke can also cause many other health problems. It has been linked to:  Lung cancer.  Cancer of the voice box (larynx) or throat.  Cancer of the sinuses.  Brain cancer.  Bladder cancer.  Stomach cancer.  Breast cancer.  White blood cell cancers (lymphoma and leukemia).  Brain and liver tumors in children.  Heart disease and stroke in adults.  Pregnancy loss (miscarriage).  Diseases in children, such as:  Asthma.  Lung infections.  Ear infections.  Sudden infant death syndrome (SIDS).  Slow growth. WHERE CAN I BE AT RISK FOR EXPOSURE TO SECONDHAND SMOKE?   For adults, the workplace is the main source of exposure to secondhand smoke.  Your workplace should have a policy separating smoking areas from nonsmoking areas.  Smoking areas should have a system for ventilating and cleaning the air.  For children, the home may be the most dangerous place  for exposure to secondhand smoke.  Children who live in apartment buildings may be at risk from smoke drifting from hallways or other people's homes.  For everyone, many public places are possible sources of exposure to secondhand smoke.  These places include restaurants, shopping centers, and parks. HOW CAN I REDUCE  MY RISK FOR EXPOSURE TO SECONDHAND SMOKE? The most important thing you can do is not smoke. Discourage family members from smoking. Other ways to reduce exposure for you and your family include the following:  Keep your home smoke free.  Make sure your child care providers do not smoke.  Warn your child about the dangers of smoking and secondhand smoke.  Do not allow smoking in your car. When someone smokes in a car, all the damaging chemicals from the smoke are confined in a small area.  Avoid public places where smoking is allowed.   This information is not intended to replace advice given to you by your health care provider. Make sure you discuss any questions you have with your health care provider.   Document Released: 04/01/2004 Document Revised: 03/15/2014 Document Reviewed: 06/08/2013 Elsevier Interactive Patient Education [employer].

## 2015-06-13 NOTE — Progress Notes (Signed)
Patient ID: Karina [name], female    DOB: 1955/12/01, 60 y.o.   MRN: [id number]  HPI Comments: Ms. Karina [name] is a 60 year old woman with a long smoking history from age [age], history of asthma, multiple sclerosis, previous history of chest pain with negative stress test in 07-06-2007 (nuclear treadmill study), previously seen by myself in 2012-07-05 for chest pain, swelling, at that time had a stress test showing no ischemia who presents today for follow-up of her chest pain  In follow-up, she reports that she continues to smoke She has had several months of chest pain symptoms, sometimes at rest, sometimes with exertion. Pain on the left side of her chest, no significant radiation. Severe episode of chest pain 05/10/2015, went away without intervention She is concerned as episodes are becoming more frequent, more intense, happening with less exertion.  Also reports having episode of a "twisting" in her stomach. She's not been taking her cholesterol medication, reports unable to tolerate generic Crestor. She stopped the brand-name Crestor, Most recent total cholesterol 265 up from 150.  She reports that she is unable to walk very far secondary to severe shortness of breath Breathing has been getting worse, seen by pulmonary in 07-06-2014 Last seen by Dr. Normajean Baxter 06-Jul-2011 She reports that she was told there was no COPD  EKG on today's visit shows normal sinus rhythm with rate 72 bpm, no significant ST or T-wave changes  Other past medical history  August 2013 she was started on a new medication for her MS. After being on this for 7 days, she felt poorly. She had fluid retention, abdominal swelling. Medication was held. she continued to have swelling in her upper tremors, legs, feet, stomach, "all over".   Father died in 06-Jul-2011.   Allergies  Allergen Reactions  . Percocet [Oxycodone-Acetaminophen] Shortness Of Breath    Felt like going to pass out and sweating  . Tecfidera [Dimethyl Fumarate] Shortness Of  Breath, Palpitations, Rash and Cough  . Amitriptyline Hcl     REACTION: swelling and itching  . Atorvastatin     REACTION: elevated LFT's  . Betaseron [Interferon Beta-1b]     Headache  . Clarithromycin     REACTION: reaction not known  . Duloxetine     REACTION: pain  . Levofloxacin     REACTION: Rash  . Pregabalin     REACTION: LE swelling  . Pseudoephedrine     REACTION: legs hurt  . Zoloft [Sertraline Hcl] Other (See Comments)    Headaches     Current Outpatient Prescriptions on File Prior to Visit  Medication Sig Dispense Refill  . albuterol (VENTOLIN HFA) 108 (90 BASE) MCG/ACT inhaler INHALE 2 PUFFS UP TO EVERY 4 HOURS AS NEEDED FOR WHEEZING 1 Inhaler 11  . ALPRAZolam (XANAX) 0.5 MG tablet Take 1 tablet (0.5 mg total) by mouth 2 (two) times daily as needed. For anxiety. 30 tablet 0  . azelastine (ASTELIN) 0.1 % nasal spray Place 2 sprays into both nostrils 2 (two) times daily as needed.  12  . budesonide-formoterol (SYMBICORT) 160-4.5 MCG/ACT inhaler Inhale 2 puffs into the lungs 2 (two) times daily. 1 Inhaler 11  . gabapentin (NEURONTIN) 300 MG capsule TAKE 2 CAPSULES (600 MG TOTAL) BY MOUTH AT BEDTIME. 180 capsule 0  . omeprazole (PRILOSEC) 40 MG capsule Take 1 capsule (40 mg total) by mouth daily. 30 capsule 11  . Probiotic Product (ALIGN PO) Take by mouth daily.     No current facility-administered medications on file prior  to visit.    Past Medical History  Diagnosis Date  . Vertigo   . GERD (gastroesophageal reflux disease)   . Seizure disorder ([place])     single seizure/hypoglycemia  . Urinary incontinence   . Plantar fasciitis   . Colon polyps 08/2008    Adenomatous  . Gastritis 08/2008    H. pylori  . Lung nodule   . Menopausal disorder   . Multiple sclerosis ([place])   . Allergic rhinitis   . Hypercholesteremia   . Diabetes mellitus   . COPD (chronic obstructive pulmonary disease) ([place])   . History of shingles     Left V1  . Allergy   . Anxiety      Councelor- Pervis Hocking, no per pt  . Depression     no per pt  . Cataract   . Neuromuscular disorder ([place])     Multiple sclerosis  . Seizures ([place])     1 seizure in 2001 due to blood sugar dropping to 38, none since    Past Surgical History  Procedure Laterality Date  . Cervical discectomy  2004    x 2, Anterior; Fusion C4-5, C5-6, C6-7, Dr. Kary Kos  . Tubal ligation    . Dilation and curettage of uterus    . Tonsillectomy    . Foot surgery      left plantar fascial problem  . Cholecystectomy    . Chest ct  11/2008    With small 4 mm nodule L lung base (rec re check in 1 year)  . Chest ct  07/2009    Re check chest CT - lung nodule stable  . Ct sinus ltd w/o cm  02/2009    negative  . Colonoscopy  08/2008    Polyps/ re check 5 yrs  . Esophagogastroduodenoscopy  08/2008    Erosive gastritis, h pylori (treated)  . Rotator cuff repair      right  . Upper gastrointestinal endoscopy      Social History  reports that she has been smoking Cigarettes.  She has a 30 pack-year smoking history. She has never used smokeless tobacco. She reports that she does not drink alcohol or use illicit drugs.  Family History family history includes Colon cancer (age of onset: 39) in her maternal grandfather; Coronary artery disease in her cousin, father, mother, and paternal grandmother; Diabetes in her father; Heart attack in her mother; Lung cancer in her paternal aunt; Migraines in her mother; Renal Disease in her father; Stroke in her father. There is no history of Multiple sclerosis, Esophageal cancer, Rectal cancer, or Stomach cancer.   Review of Systems  Constitutional: Negative.   Respiratory: Positive for shortness of breath.   Cardiovascular: Positive for chest pain and leg swelling.  Gastrointestinal: Negative.   Musculoskeletal: Negative.   Neurological: Negative.   Psychiatric/Behavioral: Negative.   All other systems reviewed and are negative.   BP 120/84 mmHg  Pulse 75   Ht 5\' 2"  (1.575 m)  Wt 185 lb 1.9 oz (83.97 kg)  BMI 33.85 kg/m2  SpO2 94%  Physical Exam  Constitutional: She is oriented to person, place, and time. She appears well-developed and well-nourished.  HENT:  Head: Normocephalic.  Nose: Nose normal.  Mouth/Throat: Oropharynx is clear and moist.  Eyes: Conjunctivae are normal. Pupils are equal, round, and reactive to light.  Neck: Normal range of motion. Neck supple. No JVD present.  Cardiovascular: Normal rate, regular rhythm, S1 normal, S2 normal, normal heart sounds and intact  distal pulses.  Exam reveals no gallop and no friction rub.   No murmur heard. Pulmonary/Chest: Effort normal and breath sounds normal. No respiratory distress. She has no wheezes. She has no rales. She exhibits no tenderness.  Abdominal: Soft. Bowel sounds are normal. She exhibits no distension. There is no tenderness.  Musculoskeletal: Normal range of motion. She exhibits no edema or tenderness.  Lymphadenopathy:    She has no cervical adenopathy.  Neurological: She is alert and oriented to person, place, and time. Coordination normal.  Skin: Skin is warm and dry. No rash noted. No erythema.  Psychiatric: She has a normal mood and affect. Her behavior is normal. Judgment and thought content normal.    Assessment and Plan  Nursing note and vitals reviewed.

## 2015-06-13 NOTE — Telephone Encounter (Signed)
Notified patient & [facility] the Crestor has been approved until 06/12/2017.

## 2015-06-13 NOTE — Assessment & Plan Note (Signed)
Etiology of her chest pain is unclear, high concern of ischemia given continued smoking, hyperlipidemia. Unable to treadmill secondary to severe shortness of breath on exertion. Recommended pharmacologic Myoview given worsening chest pain symptoms concerning for angina She is in agreement with this We did discuss with her that there is no smoking and no caffeine for 24 hours prior to the test

## 2015-06-13 NOTE — Assessment & Plan Note (Signed)
Etiology of her shortness of breath is unclear though concerning for COPD Unable to exclude underlying ischemia If symptoms persist, echocardiogram could be performed to rule out elevated right heart pressures   Total encounter time more than 25 minutes  Greater than 50% was spent in counseling and coordination of care with the patient

## 2015-06-17 ENCOUNTER — Ambulatory Visit: Payer: Medicare HMO | Admitting: Adult Health

## 2015-06-18 ENCOUNTER — Encounter: Payer: Self-pay | Admitting: Adult Health

## 2015-06-18 ENCOUNTER — Encounter
Admission: RE | Admit: 2015-06-18 | Discharge: 2015-06-18 | Disposition: A | Discharge status: Home / Self Care | Payer: Commercial Managed Care - HMO | Source: Ambulatory Visit | Attending: Cardiovascular Disease | Admitting: Cardiovascular Disease

## 2015-06-18 DIAGNOSIS — R0602 Shortness of breath: Secondary | ICD-10-CM | POA: Diagnosis not present

## 2015-06-18 DIAGNOSIS — I209 Angina pectoris, unspecified: Secondary | ICD-10-CM | POA: Diagnosis not present

## 2015-06-18 LAB — NM MYOCAR MULTI W/SPECT W/WALL MOTION / EF
Estimated workload: 1 METS
LV dias vol: 61 mL (ref 46–106)
LV sys vol: 19 mL
Peak HR: 90 {beats}/min
Percent HR: 57 %
Percent of predicted max HR: 55 %
Rest HR: 65 {beats}/min
SDS: 0
SRS: 8
SSS: 8
Stage 1 HR: 67 {beats}/min
Stage 2 Grade: 0 %
Stage 2 HR: 65 {beats}/min
Stage 2 Speed: 0 mph
Stage 3 Grade: 0 %
Stage 3 HR: 65 {beats}/min
Stage 3 Speed: 0 mph
Stage 4 Grade: 0 %
Stage 4 HR: 90 {beats}/min
Stage 4 Speed: 0 mph
Stage 5 DBP: 59 mmHg
Stage 5 Grade: 0 %
Stage 5 HR: 88 {beats}/min
Stage 5 SBP: 111 mmHg
Stage 5 Speed: 0 mph
Stage 6 DBP: 59 mmHg
Stage 6 Grade: 0 %
Stage 6 HR: 81 {beats}/min
Stage 6 SBP: 111 mmHg
Stage 6 Speed: 0 mph
TID: 0.89

## 2015-06-18 MED ORDER — TECHNETIUM TC 99M SESTAMIBI - CARDIOLITE
30.6000 | Freq: Once | INTRAVENOUS | Status: AC | PRN
Start: 2015-06-18 — End: 2015-06-18
  Administered 2015-06-18: 10:00:00 30.6 via INTRAVENOUS

## 2015-06-18 MED ORDER — REGADENOSON 0.4 MG/5ML IV SOLN: 0.4000 mg | Freq: Once | INTRAVENOUS | Status: DC

## 2015-06-18 MED ORDER — TECHNETIUM TC 99M SESTAMIBI - CARDIOLITE
13.0400 | Freq: Once | INTRAVENOUS | Status: AC | PRN
  Administered 2015-06-18: 13.04 via INTRAVENOUS

## 2015-07-08 ENCOUNTER — Telehealth: Payer: Self-pay | Admitting: Cardiovascular Disease

## 2015-07-08 NOTE — Telephone Encounter (Signed)
Please call with nm stress test results.

## 2015-07-08 NOTE — Telephone Encounter (Signed)
Reviewed results w/ pt:   There was no ST segment deviation noted during stress.  Pharmacological myocardial perfusion imaging study with no significant Ischemia Breast attenuation artifact noted. Attenuation corrected images used Normal wall motion, EF estimated at 65% No EKG changes concerning for ischemia at peak stress or in recovery. Low risk scan   Signed, Esmond Plants, MD, Ph.D [facility]  She verbalizes understanding and will call back w/ any questions or concerns.

## 2015-07-28 DIAGNOSIS — D1800 Hemangioma unspecified site: Secondary | ICD-10-CM | POA: Diagnosis not present

## 2015-07-28 DIAGNOSIS — L578 Other skin changes due to chronic exposure to nonionizing radiation: Secondary | ICD-10-CM | POA: Diagnosis not present

## 2015-07-28 DIAGNOSIS — L280 Lichen simplex chronicus: Secondary | ICD-10-CM | POA: Diagnosis not present

## 2015-07-28 DIAGNOSIS — L821 Other seborrheic keratosis: Secondary | ICD-10-CM | POA: Diagnosis not present

## 2015-07-28 DIAGNOSIS — Z1283 Encounter for screening for malignant neoplasm of skin: Secondary | ICD-10-CM | POA: Diagnosis not present

## 2015-07-28 DIAGNOSIS — L812 Freckles: Secondary | ICD-10-CM | POA: Diagnosis not present

## 2015-07-28 DIAGNOSIS — D229 Melanocytic nevi, unspecified: Secondary | ICD-10-CM | POA: Diagnosis not present

## 2015-08-19 ENCOUNTER — Telehealth: Payer: Self-pay | Admitting: Cardiovascular Disease

## 2015-08-19 ENCOUNTER — Telehealth: Payer: Self-pay

## 2015-08-19 MED ORDER — OMEPRAZOLE 40 MG PO CPDR: 40.0000 mg | DELAYED_RELEASE_CAPSULE | Freq: Every day | ORAL | Status: DC

## 2015-08-19 MED ORDER — BUDESONIDE-FORMOTEROL FUMARATE 160-4.5 MCG/ACT IN AERO: 2.0000 | INHALATION_SPRAY | Freq: Two times a day (BID) | RESPIRATORY_TRACT | Status: DC

## 2015-08-19 MED ORDER — CRESTOR 20 MG PO TABS: 20.0000 mg | ORAL_TABLET | Freq: Every day | ORAL | Status: DC

## 2015-08-19 MED ORDER — ACCU-CHEK AVIVA PLUS W/DEVICE KIT
PACK | Status: DC
Start: 2015-08-19 — End: 2022-05-25

## 2015-08-19 NOTE — Telephone Encounter (Signed)
Rx sent for Crestor 20 mg one tablet daily 90 day supply with 3 refills to [facility].

## 2015-08-19 NOTE — Telephone Encounter (Signed)
Anderson Malta with [facility] pharmacy left v/m requesting refills on omeprazole, symbicort and accu chek aviva plus meter. I spoke with pt and verified instructions on meds. Pt said she had a meter to ck sugars due to hyperglycemia but was not sure what was wrong with it. Pt said if gets meds from [place] it is free to pt. Advised pt refills done per protocol. Last annual 05/06/15.

## 2015-08-19 NOTE — Telephone Encounter (Signed)
[facility] pharmacy calling following up on fax they sent They received an order but not a prescription for medication.  Crestor 20 mg  Please advise.

## 2015-09-01 ENCOUNTER — Ambulatory Visit: Payer: Commercial Managed Care - HMO

## 2015-09-15 ENCOUNTER — Telehealth: Payer: Self-pay | Admitting: Neurology

## 2015-09-15 MED ORDER — GABAPENTIN 300 MG PO CAPS: ORAL_CAPSULE | ORAL | Status: DC

## 2015-09-15 NOTE — Telephone Encounter (Signed)
Retailed 90 day rx to [facility]. Pt needs to keep follow-up appt scheduled in August for further refills.

## 2015-09-15 NOTE — Telephone Encounter (Signed)
Pt request refill for gabapentin (NEURONTIN) 300 MG capsule to be faxed to [facility]. She was given the fax # [phone]

## 2015-10-09 ENCOUNTER — Encounter: Payer: Self-pay | Admitting: Adult Health

## 2015-10-09 ENCOUNTER — Ambulatory Visit (INDEPENDENT_AMBULATORY_CARE_PROVIDER_SITE_OTHER): Payer: Commercial Managed Care - HMO | Admitting: Adult Health

## 2015-10-09 VITALS — BP 110/66 | HR 80 | Ht 62.0 in | Wt 181.6 lb

## 2015-10-09 DIAGNOSIS — G35 Multiple sclerosis: Secondary | ICD-10-CM

## 2015-10-09 NOTE — Progress Notes (Signed)
I have read the note, and I agree with the clinical assessment and plan.  Karina [name],Karina [name]   

## 2015-10-09 NOTE — Patient Instructions (Signed)
MRI brain Consider Gilenya If your symptoms worsen or you develop new symptoms please let us know.

## 2015-10-09 NOTE — Progress Notes (Signed)
PATIENT: Karina [name] DOB: [birth year]  REASON FOR VISIT: follow up- Multiple Sclerosis HISTORY FROM: patient  HISTORY OF PRESENT ILLNESS: Karina [name] is a 60 year old female with a history of multiple sclerosis. She returns today for follow-up. In the past she has been on Betaseron and Tecfidera however she's been unable to tolerate these medications. At the last visit Dr. Jannifer Franklin recommended Pierre Bali or Rogelia Rohrer however the patient has not been on medication since December. She reports that she feels like she has "inflammation" in the hands in stomach. She states when she bends over she feels as if her stomach gets "twisted." She states that she does have new numbness in the right fingers. This is intermittent and started approximately 1-2 months ago. She notices it the most when she does housework. She denies any significant changes with her gait or balance. She does state that she feels stiff if she gets up during the night. Reports that her vision is still "foggy/blurry and feels that she has to squint." Reports she has cataracts but they're not severe enough for surgery. Denies any falls. She reports intermittent bladder incontinence. Denies any changes with the bowels. Her last MRI was in October 2016 did not show any new changes. She returns today for an evaluation.  HISTORY 02/18/15 (MM): Karina [name] is a 60 year old right-handed white female with a history of multiple sclerosis. She had been on Betaseron for quite a number of years, but she was taken off the medication to try Tecfidera, but she was unable to tolerate the Tecfidera secondary to severe fatigue. She went back on the Betaseron, but she began having severe headaches every time she injected the medication, and she had to stop the drug one month ago. She has not noted any new issues with the multiple sclerosis. She has noted over the last 2 years that she has had a change in personality, she is more irritable, more likely to say what  is on her mind. She denies depression. She has not noted any visual changes, but she does have cataract problems which seems to impair visual acuity some. The patient denies any new numbness or weakness on the face, arms, or legs. She did have a bout of bronchitis 3 weeks ago, she was running some low-grade fevers, and she had one day of some burning dysesthesias on her right forearm. This has not been persistent. She denies any changes in bowel or bladder control. She returns for an evaluation  REVIEW OF SYSTEMS: Out of a complete 14 system review of symptoms, the patient complains only of the following symptoms, and all other reviewed systems are negative.  Ear discharge, hearing loss, ringing in ears, trouble swallowing, blurred vision, cough, wheezing, shortness of breath, choking, chest tightness, rectal pain, swollen abdomen, heat intolerance, restless leg, insomnia, joint pain, joint swelling, back pain, muscle cramps, walking difficulty, incontinence of bladder, frequency of urination, bruise/bleed easily  ALLERGIES: Allergies  Allergen Reactions  . Percocet [Oxycodone-Acetaminophen] Shortness Of Breath    Felt like going to pass out and sweating  . Tecfidera [Dimethyl Fumarate] Shortness Of Breath, Palpitations, Rash and Cough  . Amitriptyline Hcl     REACTION: swelling and itching  . Atorvastatin     REACTION: elevated LFT's  . Betaseron [Interferon Beta-1b]     Headache  . Clarithromycin     REACTION: reaction not known  . Duloxetine     REACTION: pain  . Levofloxacin     REACTION: Rash  . Pregabalin  REACTION: LE swelling  . Pseudoephedrine     REACTION: legs hurt  . Zoloft [Sertraline Hcl] Other (See Comments)    Headaches     HOME MEDICATIONS: Outpatient Medications Prior to Visit  Medication Sig Dispense Refill  . albuterol (VENTOLIN HFA) 108 (90 BASE) MCG/ACT inhaler INHALE 2 PUFFS UP TO EVERY 4 HOURS AS NEEDED FOR WHEEZING 1 Inhaler 11  . ALPRAZolam (XANAX)  0.5 MG tablet Take 1 tablet (0.5 mg total) by mouth 2 (two) times daily as needed. For anxiety. 30 tablet 0  . azelastine (ASTELIN) 0.1 % nasal spray Place 2 sprays into both nostrils 2 (two) times daily as needed.  12  . Blood Glucose Monitoring Suppl (ACCU-CHEK AVIVA PLUS) w/Device KIT Check blood sugar once daily and as directed Dx R73.09 1 kit 0  . budesonide-formoterol (SYMBICORT) 160-4.5 MCG/ACT inhaler Inhale 2 puffs into the lungs 2 (two) times daily. 3 Inhaler 2  . CRESTOR 20 MG tablet Take 1 tablet (20 mg total) by mouth daily. 90 tablet 3  . gabapentin (NEURONTIN) 300 MG capsule TAKE 2 CAPSULES (600 MG TOTAL) BY MOUTH AT BEDTIME. 180 capsule 0  . omeprazole (PRILOSEC) 40 MG capsule Take 1 capsule (40 mg total) by mouth daily. 90 capsule 2  . Probiotic Product (ALIGN PO) Take by mouth daily.     No facility-administered medications prior to visit.     PAST MEDICAL HISTORY: Past Medical History:  Diagnosis Date  . Allergic rhinitis   . Allergy   . Anxiety    Councelor- Pervis Hocking, no per pt  . Cataract   . Colon polyps 08/2008   Adenomatous  . COPD (chronic obstructive pulmonary disease) ([place])   . Depression    no per pt  . Diabetes mellitus   . Gastritis 08/2008   H. pylori  . GERD (gastroesophageal reflux disease)   . History of shingles    Left V1  . Hypercholesteremia   . Lung nodule   . Menopausal disorder   . Multiple sclerosis ([place])   . Neuromuscular disorder ([place])    Multiple sclerosis  . Plantar fasciitis   . Seizure disorder ([employer])    single seizure/hypoglycemia  . Seizures ([place])    1 seizure in 2001 due to blood sugar dropping to 38, none since  . Urinary incontinence   . Vertigo     PAST SURGICAL HISTORY: Past Surgical History:  Procedure Laterality Date  . CERVICAL DISCECTOMY  2004   x 2, Anterior; Fusion C4-5, C5-6, C6-7, Dr. Kary Kos  . CHEST CT  11/2008   With small 4 mm nodule L lung base (rec re check in 1 year)  . CHEST CT  07/2009    Re check chest CT - lung nodule stable  . CHOLECYSTECTOMY    . COLONOSCOPY  08/2008   Polyps/ re check 5 yrs  . CT SINUS LTD W/O CM  02/2009   negative  . DILATION AND CURETTAGE OF UTERUS    . ESOPHAGOGASTRODUODENOSCOPY  08/2008   Erosive gastritis, h pylori (treated)  . FOOT SURGERY     left plantar fascial problem  . ROTATOR CUFF REPAIR     right  . TONSILLECTOMY    . TUBAL LIGATION    . UPPER GASTROINTESTINAL ENDOSCOPY      FAMILY HISTORY: Family History  Problem Relation Age of Onset  . Migraines Mother   . Heart attack Mother   . Coronary artery disease Mother   . Coronary artery disease  Father     6 bypasses   . Stroke Father   . Diabetes Father   . Renal Disease Father   . Colon cancer Maternal Grandfather 71  . Coronary artery disease Cousin   . Coronary artery disease Paternal Grandmother   . Lung cancer Paternal Aunt   . Multiple sclerosis Neg Hx   . Esophageal cancer Neg Hx   . Rectal cancer Neg Hx   . Stomach cancer Neg Hx     SOCIAL HISTORY: Social History   Social History  . Marital status: Married    Spouse name: N/A  . Number of children: 3  . Years of education: 9 th   Occupational History  . Disabled  Disabled   Social History Main Topics  . Smoking status: Current Every Day Smoker    Packs/day: 1.00    Years: 30.00    Types: Cigarettes  . Smokeless tobacco: Never Used  . Alcohol use No  . Drug use: No  . Sexual activity: Not on file   Other Topics Concern  . Not on file   Social History Narrative   Married with 3 children   Daily caffeine use - 2    Disabled.   Education 9th grade    Caffeine one cup of coffee daily.   Patient is right handed.       PHYSICAL EXAM  Vitals:   10/09/15 0945  BP: 110/66  Pulse: 80  Weight: 181 lb 9.6 oz (82.4 kg)  Height: '5\' 2"'$  (1.575 m)   Body mass index is 33.22 kg/m.  Generalized: Well developed, in no acute distress   Neurological examination  Mentation: Alert oriented to  time, place, history taking. Follows all commands speech and language fluent Cranial nerve II-XII: Pupils were equal round reactive to light. Extraocular movements were full, visual field were full on confrontational test. Facial sensation and strength were normal. Uvula tongue midline. Head turning and shoulder shrug  were normal and symmetric. Motor: The motor testing reveals 5 over 5 strength of all 4 extremities. Good symmetric motor tone is noted throughout.  Sensory: Sensory testing is intact to soft touch on all 4 extremities but reports slightly decreased on the right upper and lower extremity.. No evidence of extinction is noted.  Coordination: Cerebellar testing reveals good finger-nose-finger and heel-to-shin bilaterally.  Gait and station: Gait is normal. Tandem gait is normal. Romberg is negative. No drift is seen.  Reflexes: Deep tendon reflexes are symmetric and normal bilaterally.   DIAGNOSTIC DATA (LABS, IMAGING, TESTING) - I reviewed patient records, labs, notes, testing and imaging myself where available.  Lab Results  Component Value Date   WBC 6.9 11/21/2014   HGB 15.1 07/09/2013   HCT 44.2 11/21/2014   MCV 97 11/21/2014   PLT 203 11/21/2014      Component Value Date/Time   NA 140 04/30/2015 1350   NA 141 11/21/2014 1137   NA 136 07/09/2013 1803   K 4.6 04/30/2015 1350   K 4.2 07/09/2013 1803   CL 104 04/30/2015 1350   CL 105 07/09/2013 1803   CO2 30 04/30/2015 1350   CO2 22 07/09/2013 1803   GLUCOSE 92 04/30/2015 1350   GLUCOSE 96 07/09/2013 1803   BUN 12 04/30/2015 1350   BUN 11 11/21/2014 1137   BUN 16 07/09/2013 1803   CREATININE 1.05 04/30/2015 1350   CREATININE 1.13 07/09/2013 1803   CALCIUM 10.3 04/30/2015 1350   CALCIUM 9.6 07/09/2013 1803   PROT 7.4  04/30/2015 1350   PROT 6.9 11/21/2014 1137   ALBUMIN 4.4 04/30/2015 1350   ALBUMIN 4.3 11/21/2014 1137   AST 19 04/30/2015 1350   ALT 13 04/30/2015 1350   ALKPHOS 66 04/30/2015 1350   BILITOT 0.4  04/30/2015 1350   BILITOT 0.3 11/21/2014 1137   GFRNONAA 63 11/21/2014 1137   GFRNONAA 55 (L) 07/09/2013 1803   GFRAA 73 11/21/2014 1137   GFRAA >60 07/09/2013 1803   Lab Results  Component Value Date   CHOL 265 (H) 04/30/2015   HDL 69.20 04/30/2015   LDLCALC 176 (H) 04/30/2015   LDLDIRECT 206.5 01/24/2013   TRIG 98.0 04/30/2015   CHOLHDL 4 04/30/2015     Lab Results  Component Value Date   TSH 1.30 04/30/2015      ASSESSMENT AND PLAN 60 y.o. year old female  has a past medical history of Allergic rhinitis; Allergy; Anxiety; Cataract; Colon polyps (08/2008); COPD (chronic obstructive pulmonary disease) ([place]); Depression; Diabetes mellitus; Gastritis (08/2008); GERD (gastroesophageal reflux disease); History of shingles; Hypercholesteremia; Lung nodule; Menopausal disorder; Multiple sclerosis ([place]); Neuromuscular disorder ([place]); Plantar fasciitis; Seizure disorder ([place]); Seizures ([place]); Urinary incontinence; and Vertigo. here with:  1. Multiple sclerosis  The patient is not currently on any medication. I have advised that she should consider Gilenya or Aubagio. She voiced understanding. Her last MRI was in October 2016. I will order a repeat of the MRI of the brain to look for any acute changes. Patient advised that if her symptoms worsen or she develops any new symptoms she should let us know. Follow-up in 6 months or sooner if needed.   Ward Givens, MSN, NP-C 10/09/2015, 9:51 AM [facility] [address] [place], [place] [address] [phone]

## 2015-10-23 ENCOUNTER — Encounter: Payer: Self-pay | Admitting: Adult Health

## 2015-11-11 ENCOUNTER — Inpatient Hospital Stay: Admission: RE | Admit: 2015-11-11 | Payer: Commercial Managed Care - HMO | Source: Ambulatory Visit

## 2015-12-15 ENCOUNTER — Other Ambulatory Visit: Payer: Self-pay | Admitting: Neurology

## 2016-03-10 ENCOUNTER — Other Ambulatory Visit: Payer: Self-pay | Admitting: Neurology

## 2016-03-23 ENCOUNTER — Ambulatory Visit (INDEPENDENT_AMBULATORY_CARE_PROVIDER_SITE_OTHER): Payer: Medicare HMO | Admitting: Adult Health

## 2016-03-23 ENCOUNTER — Encounter: Payer: Self-pay | Admitting: Adult Health

## 2016-03-23 VITALS — BP 116/74 | HR 75 | Ht 62.0 in | Wt 186.6 lb

## 2016-03-23 DIAGNOSIS — G35 Multiple sclerosis: Secondary | ICD-10-CM

## 2016-03-23 MED ORDER — GABAPENTIN 300 MG PO CAPS: ORAL_CAPSULE | ORAL | 3 refills | Status: DC

## 2016-03-23 NOTE — Patient Instructions (Signed)
MRI brain If your symptoms worsen or you develop new symptoms please let us know.

## 2016-03-23 NOTE — Progress Notes (Signed)
I have read the note, and I agree with the clinical assessment and plan.  Karina [name] KEITH   

## 2016-03-23 NOTE — Progress Notes (Signed)
PATIENT: Karina [name] DOB: 1955/08/07  REASON FOR VISIT: follow up- multiple sclerosis HISTORY FROM: patient  HISTORY OF PRESENT ILLNESS: Today 03/23/2016: Karina [name] is a 60 year old female with a history of multiple sclerosis. She returns today for follow-up. She is currently not on any disease modifying agents. At the last visit a MRI of the brain was ordered however the patient states that she did not have this completed due to cost. She states overall she's been doing well. She reports approximately 3 months ago she had numbness in the left hand. She states that this has improved. She still has some numbness in the last 2 digits however she reports that this is improving. She denies any changes with her gait or balance. Denies any new changes with the bowels or bladder. Continues to have incontinence with the bladder. She intermittently has blurry vision. Denies any falls. She returns today for an evaluation. HISTORY 10/09/15: Karina [name] is a 61 year old female with a history of multiple sclerosis. She returns today for follow-up. In the past she has been on Betaseron and Tecfidera however she's been unable to tolerate these medications. At the last visit Dr. Jannifer Franklin recommended Pierre Bali or Rogelia Rohrer however the patient has not been on medication since December. She reports that she feels like she has "inflammation" in the hands in stomach. She states when she bends over she feels as if her stomach gets "twisted." She states that she does have new numbness in the right fingers. This is intermittent and started approximately 1-2 months ago. She notices it the most when she does housework. She denies any significant changes with her gait or balance. She does state that she feels stiff if she gets up during the night. Reports that her vision is still "foggy/blurry and feels that she has to squint." Reports she has cataracts but they're not severe enough for surgery. Denies any falls. She reports  intermittent bladder incontinence. Denies any changes with the bowels. Her last MRI was in October 2016 did not show any new changes. She returns today for an evaluation.  HISTORY 02/18/15 (MM): Karina [name] is a 61 year old right-handed white female with a history of multiple sclerosis. She had been on Betaseron for quite a number of years, but she was taken off the medication to try Tecfidera, but she was unable to tolerate the Tecfidera secondary to severe fatigue. She went back on the Betaseron, but she began having severe headaches every time she injected the medication, and she had to stop the drug one month ago. She has not noted any new issues with the multiple sclerosis. She has noted over the last 2 years that she has had a change in personality, she is more irritable, more likely to say what is on her mind. She denies depression. She has not noted any visual changes, but she does have cataract problems which seems to impair visual acuity some. The patient denies any new numbness or weakness on the face, arms, or legs. She did have a bout of bronchitis 3 weeks ago, she was running some low-grade fevers, and she had one day of some burning dysesthesias on her right forearm. This has not been persistent. She denies any changes in bowel or bladder control. She returns for an evaluation  REVIEW OF SYSTEMS: Out of a complete 14 system review of symptoms, the patient complains only of the following symptoms, and all other reviewed systems are negative.  Joint swelling, back pain, rectal pain, restless leg, insomnia, incontinence  of bladder, numbness  ALLERGIES: Allergies  Allergen Reactions  . Percocet [Oxycodone-Acetaminophen] Shortness Of Breath    Felt like going to pass out and sweating  . Tecfidera [Dimethyl Fumarate] Shortness Of Breath, Palpitations, Rash and Cough  . Amitriptyline Hcl     REACTION: swelling and itching  . Atorvastatin     REACTION: elevated LFT's  . Betaseron [Interferon  Beta-1b]     Headache  . Clarithromycin     REACTION: reaction not known  . Duloxetine     REACTION: pain  . Levofloxacin     REACTION: Rash  . Pregabalin     REACTION: LE swelling  . Pseudoephedrine     REACTION: legs hurt  . Zoloft [Sertraline Hcl] Other (See Comments)    Headaches     HOME MEDICATIONS: Outpatient Medications Prior to Visit  Medication Sig Dispense Refill  . albuterol (VENTOLIN HFA) 108 (90 BASE) MCG/ACT inhaler INHALE 2 PUFFS UP TO EVERY 4 HOURS AS NEEDED FOR WHEEZING 1 Inhaler 11  . ALPRAZolam (XANAX) 0.5 MG tablet Take 1 tablet (0.5 mg total) by mouth 2 (two) times daily as needed. For anxiety. 30 tablet 0  . azelastine (ASTELIN) 0.1 % nasal spray Place 2 sprays into both nostrils 2 (two) times daily as needed.  12  . Blood Glucose Monitoring Suppl (ACCU-CHEK AVIVA PLUS) w/Device KIT Check blood sugar once daily and as directed Dx R73.09 1 kit 0  . budesonide-formoterol (SYMBICORT) 160-4.5 MCG/ACT inhaler Inhale 2 puffs into the lungs 2 (two) times daily. 3 Inhaler 2  . CRESTOR 20 MG tablet Take 1 tablet (20 mg total) by mouth daily. 90 tablet 3  . gabapentin (NEURONTIN) 300 MG capsule TAKE 2 CAPSULES AT BEDTIME (MUST KEEP FOLLOW UP APPT FOR FURTHER REFILLS) 180 capsule 0  . omeprazole (PRILOSEC) 40 MG capsule Take 1 capsule (40 mg total) by mouth daily. 90 capsule 2  . Probiotic Product (ALIGN PO) Take by mouth daily.     No facility-administered medications prior to visit.     PAST MEDICAL HISTORY: Past Medical History:  Diagnosis Date  . Allergic rhinitis   . Allergy   . Anxiety    [place] [place], no per pt  . Cataract   . Colon polyps 08/2008   Adenomatous  . COPD (chronic obstructive pulmonary disease) ([place])   . Depression    no per pt  . Diabetes mellitus   . Gastritis 08/2008   H. pylori  . GERD (gastroesophageal reflux disease)   . History of shingles    Left V1  . Hypercholesteremia   . Lung nodule   . Menopausal disorder     . Multiple sclerosis ([place])   . Neuromuscular disorder ([place])    Multiple sclerosis  . Plantar fasciitis   . Seizure disorder ([place])    single seizure/hypoglycemia  . Seizures ([place])    1 seizure in 2001 due to blood sugar dropping to 38, none since  . Urinary incontinence   . Vertigo     PAST SURGICAL HISTORY: Past Surgical History:  Procedure Laterality Date  . CERVICAL DISCECTOMY  2004   x 2, Anterior; Fusion C4-5, C5-6, C6-7, Dr. Kary Kos  . CHEST CT  11/2008   With small 4 mm nodule L lung base (rec re check in 1 year)  . CHEST CT  07/2009   Re check chest CT - lung nodule stable  . CHOLECYSTECTOMY    . COLONOSCOPY  08/2008   Polyps/ re check  5 yrs  . CT SINUS LTD W/O CM  02/2009   negative  . DILATION AND CURETTAGE OF UTERUS    . ESOPHAGOGASTRODUODENOSCOPY  08/2008   Erosive gastritis, h pylori (treated)  . FOOT SURGERY     left plantar fascial problem  . ROTATOR CUFF REPAIR     right  . TONSILLECTOMY    . TUBAL LIGATION    . UPPER GASTROINTESTINAL ENDOSCOPY      FAMILY HISTORY: Family History  Problem Relation Age of Onset  . Migraines Mother   . Heart attack Mother   . Coronary artery disease Mother   . Coronary artery disease Father     6 bypasses   . Stroke Father   . Diabetes Father   . Renal Disease Father   . Colon cancer Maternal Grandfather 83  . Coronary artery disease Cousin   . Coronary artery disease Paternal Grandmother   . Lung cancer Paternal Aunt   . Multiple sclerosis Neg Hx   . Esophageal cancer Neg Hx   . Rectal cancer Neg Hx   . Stomach cancer Neg Hx     SOCIAL HISTORY: Social History   Social History  . Marital status: Married    Spouse name: N/A  . Number of children: 3  . Years of education: 9 th   Occupational History  . Disabled  Disabled   Social History Main Topics  . Smoking status: Current Every Day Smoker    Packs/day: 1.00    Years: 30.00    Types: Cigarettes  . Smokeless tobacco: Never Used  . Alcohol  use No  . Drug use: No  . Sexual activity: Not on file   Other Topics Concern  . Not on file   Social History Narrative   Married with 3 children   Daily caffeine use - 2    Disabled.   Education 9th grade    Caffeine one cup of coffee daily.   Patient is right handed.       PHYSICAL EXAM  Vitals:   03/23/16 1409  BP: 116/74  Pulse: 75  Weight: 186 lb 9.6 oz (84.6 kg)  Height: '5\' 2"'$  (1.575 m)   Body mass index is 34.13 kg/m.  Generalized: Well developed, in no acute distress   Neurological examination  Mentation: Alert oriented to time, place, history taking. Follows all commands speech and language fluent Cranial nerve II-XII: Pupils were equal round reactive to light. Extraocular movements were full, visual field were full on confrontational test. Facial sensation and strength were normal. Uvula tongue midline. Head turning and shoulder shrug  were normal and symmetric. Motor: The motor testing reveals 5 over 5 strength of all 4 extremities. Good symmetric motor tone is noted throughout.  Sensory: Sensory testing is intact to soft touch on all 4 extremities. No evidence of extinction is noted.  Coordination: Cerebellar testing reveals good finger-nose-finger and heel-to-shin bilaterally.  Gait and station: Gait is normal. Tandem gait is normal. Romberg is negative. No drift is seen.  Reflexes: Deep tendon reflexes are symmetric and normal bilaterally.   DIAGNOSTIC DATA (LABS, IMAGING, TESTING) - I reviewed patient records, labs, notes, testing and imaging myself where available.  Lab Results  Component Value Date   WBC 6.9 11/21/2014   HGB 15.1 07/09/2013   HCT 44.2 11/21/2014   MCV 97 11/21/2014   PLT 203 11/21/2014      Component Value Date/Time   NA 140 04/30/2015 1350   NA 141 11/21/2014 1137  NA 136 07/09/2013 1803   K 4.6 04/30/2015 1350   K 4.2 07/09/2013 1803   CL 104 04/30/2015 1350   CL 105 07/09/2013 1803   CO2 30 04/30/2015 1350   CO2 22  07/09/2013 1803   GLUCOSE 92 04/30/2015 1350   GLUCOSE 96 07/09/2013 1803   BUN 12 04/30/2015 1350   BUN 11 11/21/2014 1137   BUN 16 07/09/2013 1803   CREATININE 1.05 04/30/2015 1350   CREATININE 1.13 07/09/2013 1803   CALCIUM 10.3 04/30/2015 1350   CALCIUM 9.6 07/09/2013 1803   PROT 7.4 04/30/2015 1350   PROT 6.9 11/21/2014 1137   ALBUMIN 4.4 04/30/2015 1350   ALBUMIN 4.3 11/21/2014 1137   AST 19 04/30/2015 1350   ALT 13 04/30/2015 1350   ALKPHOS 66 04/30/2015 1350   BILITOT 0.4 04/30/2015 1350   BILITOT 0.3 11/21/2014 1137   GFRNONAA 63 11/21/2014 1137   GFRNONAA 55 (L) 07/09/2013 1803   GFRAA 73 11/21/2014 1137   GFRAA >60 07/09/2013 1803   Lab Results  Component Value Date   CHOL 265 (H) 04/30/2015   HDL 69.20 04/30/2015   LDLCALC 176 (H) 04/30/2015   LDLDIRECT 206.5 01/24/2013   TRIG 98.0 04/30/2015   CHOLHDL 4 04/30/2015   Lab Results  Component Value Date   HGBA1C 5.9 01/24/2013    Lab Results  Component Value Date   TSH 1.30 04/30/2015      ASSESSMENT AND PLAN 61 y.o. year old female  has a past medical history of Allergic rhinitis; Allergy; Anxiety; Cataract; Colon polyps (08/2008); COPD (chronic obstructive pulmonary disease) ([place]); Depression; Diabetes mellitus; Gastritis (08/2008); GERD (gastroesophageal reflux disease); History of shingles; Hypercholesteremia; Lung nodule; Menopausal disorder; Multiple sclerosis ([place]); Neuromuscular disorder ([place]); Plantar fasciitis; Seizure disorder ([place]); Seizures ([place]); Urinary incontinence; and Vertigo. here with:  1. Multiple sclerosis  Overall the patient has remained stable. I've encouraged the patient to follow through with MRI of the brain. A new order was placed today. Patient is not on any disease modifying therapy. She does not wish to start any new medication today. I did review the potential risk associated with not being on any medication. Patient voiced understanding. She will follow-up in 6 months or  sooner if needed.     Ward Givens, MSN, NP-C 03/23/2016, 2:49 PM [facility] [address], [place], [place] [address] [phone]

## 2016-04-12 ENCOUNTER — Ambulatory Visit: Payer: Commercial Managed Care - HMO | Admitting: Adult Health

## 2016-04-30 ENCOUNTER — Telehealth: Payer: Self-pay | Admitting: Family Medicine

## 2016-04-30 ENCOUNTER — Other Ambulatory Visit: Payer: Self-pay | Admitting: Family Medicine

## 2016-04-30 DIAGNOSIS — N6002 Solitary cyst of left breast: Secondary | ICD-10-CM | POA: Insufficient documentation

## 2016-04-30 DIAGNOSIS — Z1231 Encounter for screening mammogram for malignant neoplasm of breast: Secondary | ICD-10-CM

## 2016-04-30 DIAGNOSIS — N644 Mastodynia: Secondary | ICD-10-CM

## 2016-04-30 NOTE — Telephone Encounter (Signed)
Please route those orders to me and I will sign them thanks

## 2016-04-30 NOTE — Telephone Encounter (Signed)
Spoke to the [facility] and they said if you want pt to have imaging done now before due date of 06/01/16 you would have to put [id number] (mammogram) and [phone] ([place]), if you want pt to have mammogram/US done after her due date of 06/01/16 then you would put [id number], and [id number] in

## 2016-04-30 NOTE — Telephone Encounter (Signed)
Pt notified orders are done and [facility] and [facility]t

## 2016-04-30 NOTE — Telephone Encounter (Signed)
I put orders in to be done before "due date" The second code was for [place] of R breast not left-but cyst was in the L so I changed it to left Thanks

## 2016-04-30 NOTE — Telephone Encounter (Signed)
Opened in error

## 2016-04-30 NOTE — Telephone Encounter (Signed)
[facility] called [facility] in [place] to scheduled her mammogram.  Patient was told she needed to call PCP to get a diagnostic order for burning in right breast and for a cyst in her left breast.  It looks like [facility] put an order in [place].  Please call patient when the order is done.

## 2016-05-06 ENCOUNTER — Ambulatory Visit
Admission: RE | Admit: 2016-05-06 | Discharge: 2016-05-06 | Disposition: A | Discharge status: Home / Self Care | Payer: Commercial Managed Care - HMO | Source: Ambulatory Visit | Attending: Family Medicine | Admitting: Family Medicine

## 2016-05-06 DIAGNOSIS — N6489 Other specified disorders of breast: Secondary | ICD-10-CM | POA: Diagnosis not present

## 2016-05-06 DIAGNOSIS — N644 Mastodynia: Secondary | ICD-10-CM

## 2016-05-06 DIAGNOSIS — N6002 Solitary cyst of left breast: Secondary | ICD-10-CM

## 2016-05-18 ENCOUNTER — Encounter: Payer: Self-pay | Admitting: Family Medicine

## 2016-05-18 ENCOUNTER — Ambulatory Visit (INDEPENDENT_AMBULATORY_CARE_PROVIDER_SITE_OTHER): Payer: Commercial Managed Care - HMO | Admitting: Family Medicine

## 2016-05-18 ENCOUNTER — Ambulatory Visit (INDEPENDENT_AMBULATORY_CARE_PROVIDER_SITE_OTHER)
Admission: RE | Admit: 2016-05-18 | Discharge: 2016-05-18 | Disposition: A | Discharge status: Home / Self Care | Payer: Commercial Managed Care - HMO | Source: Ambulatory Visit | Attending: Family Medicine | Admitting: Family Medicine

## 2016-05-18 VITALS — BP 130/92 | HR 71 | Temp 97.9°F | Ht 62.0 in | Wt 185.5 lb

## 2016-05-18 DIAGNOSIS — R05 Cough: Secondary | ICD-10-CM

## 2016-05-18 DIAGNOSIS — K219 Gastro-esophageal reflux disease without esophagitis: Secondary | ICD-10-CM

## 2016-05-18 DIAGNOSIS — R21 Rash and other nonspecific skin eruption: Secondary | ICD-10-CM | POA: Diagnosis not present

## 2016-05-18 DIAGNOSIS — R059 Cough, unspecified: Secondary | ICD-10-CM

## 2016-05-18 DIAGNOSIS — F419 Anxiety disorder, unspecified: Secondary | ICD-10-CM | POA: Diagnosis not present

## 2016-05-18 DIAGNOSIS — F17200 Nicotine dependence, unspecified, uncomplicated: Secondary | ICD-10-CM

## 2016-05-18 DIAGNOSIS — R1084 Generalized abdominal pain: Secondary | ICD-10-CM | POA: Insufficient documentation

## 2016-05-18 DIAGNOSIS — R1030 Lower abdominal pain, unspecified: Secondary | ICD-10-CM

## 2016-05-18 LAB — COMPREHENSIVE METABOLIC PANEL
ALT: 14 U/L (ref 0–35)
AST: 17 U/L (ref 0–37)
Albumin: 4.3 g/dL (ref 3.5–5.2)
Alkaline Phosphatase: 69 U/L (ref 39–117)
BUN: 7 mg/dL (ref 6–23)
CO2: 28 mEq/L (ref 19–32)
Calcium: 10.1 mg/dL (ref 8.4–10.5)
Chloride: 101 mEq/L (ref 96–112)
Creatinine, Ser: 0.97 mg/dL (ref 0.40–1.20)
GFR: 62.62 mL/min (ref >60.00)
Glucose, Bld: 101 mg/dL — ABNORMAL HIGH (ref 70–99)
Potassium: 4.4 mEq/L (ref 3.5–5.1)
Sodium: 135 mEq/L (ref 135–145)
Total Bilirubin: 0.4 mg/dL (ref 0.2–1.2)
Total Protein: 7.7 g/dL (ref 6.0–8.3)

## 2016-05-18 LAB — POC URINALSYSI DIPSTICK (AUTOMATED)
Bilirubin, UA: NEGATIVE
Blood, UA: NEGATIVE
Glucose, UA: NEGATIVE
Ketones, UA: NEGATIVE
Leukocytes, UA: NEGATIVE
Nitrite, UA: NEGATIVE
Protein, UA: NEGATIVE
Spec Grav, UA: 1.03
Urobilinogen, UA: 0.2
pH, UA: 6

## 2016-05-18 LAB — CBC WITH DIFFERENTIAL/PLATELET
Basophils Absolute: 0.1 10*3/uL (ref 0.0–0.1)
Basophils Relative: 1.1 % (ref 0.0–3.0)
Eosinophils Absolute: 0.2 10*3/uL (ref 0.0–0.7)
Eosinophils Relative: 2.2 % (ref 0.0–5.0)
HCT: 46 % (ref 36.0–46.0)
Hemoglobin: 15.3 g/dL — ABNORMAL HIGH (ref 12.0–15.0)
Lymphocytes Relative: 32.5 % (ref 12.0–46.0)
Lymphs Abs: 2.3 10*3/uL (ref 0.7–4.0)
MCHC: 33.4 g/dL (ref 30.0–36.0)
MCV: 95.9 fl (ref 78.0–100.0)
Monocytes Absolute: 0.4 10*3/uL (ref 0.1–1.0)
Monocytes Relative: 6.3 % (ref 3.0–12.0)
Neutro Abs: 4.1 10*3/uL (ref 1.4–7.7)
Neutrophils Relative %: 57.9 % (ref 43.0–77.0)
Platelets: 201 10*3/uL (ref 150.0–400.0)
RBC: 4.8 Mil/uL (ref 3.87–5.11)
RDW: 13.5 % (ref 11.5–15.5)
WBC: 7.1 10*3/uL (ref 4.0–10.5)

## 2016-05-18 LAB — LIPASE: Lipase: 47 U/L (ref 11.0–59.0)

## 2016-05-18 MED ORDER — MOMETASONE FUROATE 0.1 % EX CREA: 1.0000 "application " | TOPICAL_CREAM | Freq: Every day | CUTANEOUS | 1 refills | Status: DC

## 2016-05-18 MED ORDER — OMEPRAZOLE 40 MG PO CPDR: 40.0000 mg | DELAYED_RELEASE_CAPSULE | Freq: Two times a day (BID) | ORAL | 3 refills | Status: DC

## 2016-05-18 NOTE — Assessment & Plan Note (Signed)
In anxious pt with hx of gerd/gastritis and ibs  She is tender in all areas of abdomen  Will inc her omeprazole to bid  Labs today for abd pain  Update if symptoms worsen or change in the meantime  No nsaids currently

## 2016-05-18 NOTE — Patient Instructions (Addendum)
For the rash- stick with dove soap instead of dial  Try the mometasone cream daily until better - and let us know it if does not improve Try not to rub or scratch  For cough - chest xray today  Cough and clearing throat can also come from acid  Keep thinking about quitting smoking - good job cutting back! Chest xray today to check on lungs  For acid reflux - increase your omeprazole to twice daily  Avoid carbonation -this can make reflux and stomach symptoms worse  Labs today for abdominal pain

## 2016-05-18 NOTE — Assessment & Plan Note (Signed)
Worse lately and also more coughing and throat clearing  Rev last EGD (hx of erosive gastritis) Inc omeprazole to 40 mg bid and update if no imp

## 2016-05-18 NOTE — Assessment & Plan Note (Addendum)
In a long time smoker who is cutting down  Acute on chronic She uses symbicort Also more anxious /stressed lately  Also symptoms of gerd/could cause cough cxr today  Exam is stable

## 2016-05-18 NOTE — Assessment & Plan Note (Signed)
Worse currently with stressors and legal issues  This may be worsening other physical symptosm  Has used xanax in the past  Offered counseling

## 2016-05-18 NOTE — Assessment & Plan Note (Signed)
Disc in detail risks of smoking and possible outcomes including copd, vascular/ heart disease, cancer , respiratory and sinus infections  Pt voices understanding Pt has cut down with eventual intent to quit with patches but no quit date Encouraged I suspect she is having smoker's cough cxr today

## 2016-05-18 NOTE — Assessment & Plan Note (Signed)
Some scabs on forehead  bilat - so do not think zoster Resembles nonsp dermatitis Rev her products and urged to stick with dove soap Px mometasone cream to try  Update if no imp or if worse

## 2016-05-18 NOTE — Progress Notes (Signed)
Pre visit review using our clinic review tool, if applicable. No additional management support is needed unless otherwise documented below in the visit note. 
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
## 2016-06-18 ENCOUNTER — Emergency Department
Admission: EM | Admit: 2016-06-18 | Discharge: 2016-06-18 | Disposition: A | Discharge status: Home / Self Care | Payer: Medicare HMO | Attending: Emergency Medicine | Admitting: Emergency Medicine

## 2016-06-18 ENCOUNTER — Emergency Department: Payer: Medicare HMO

## 2016-06-18 DIAGNOSIS — R911 Solitary pulmonary nodule: Secondary | ICD-10-CM

## 2016-06-18 DIAGNOSIS — F17210 Nicotine dependence, cigarettes, uncomplicated: Secondary | ICD-10-CM | POA: Diagnosis not present

## 2016-06-18 DIAGNOSIS — Z79899 Other long term (current) drug therapy: Secondary | ICD-10-CM | POA: Diagnosis not present

## 2016-06-18 DIAGNOSIS — E119 Type 2 diabetes mellitus without complications: Secondary | ICD-10-CM | POA: Diagnosis not present

## 2016-06-18 DIAGNOSIS — R109 Unspecified abdominal pain: Secondary | ICD-10-CM | POA: Diagnosis not present

## 2016-06-18 DIAGNOSIS — J449 Chronic obstructive pulmonary disease, unspecified: Secondary | ICD-10-CM | POA: Insufficient documentation

## 2016-06-18 LAB — URINALYSIS, COMPLETE (UACMP) WITH MICROSCOPIC
Bacteria, UA: NONE SEEN
Bilirubin Urine: NEGATIVE
Glucose, UA: NEGATIVE mg/dL
Hgb urine dipstick: NEGATIVE
Ketones, ur: NEGATIVE mg/dL
Leukocytes, UA: NEGATIVE
Nitrite: NEGATIVE
Protein, ur: NEGATIVE mg/dL
RBC / HPF: NONE SEEN RBC/hpf (ref 0–5)
Specific Gravity, Urine: 1.011 (ref 1.005–1.030)
pH: 5 (ref 5.0–8.0)

## 2016-06-18 LAB — BASIC METABOLIC PANEL
Anion gap: 9 (ref 5–15)
BUN: 13 mg/dL (ref 6–20)
CO2: 27 mmol/L (ref 22–32)
Calcium: 9.6 mg/dL (ref 8.9–10.3)
Chloride: 102 mmol/L (ref 101–111)
Creatinine, Ser: 1.13 mg/dL — ABNORMAL HIGH (ref 0.44–1.00)
GFR calc Af Amer: >60 mL/min (ref >60)
GFR calc non Af Amer: 53 mL/min — ABNORMAL LOW (ref >60)
Glucose, Bld: 158 mg/dL — ABNORMAL HIGH (ref 65–99)
Potassium: 3.9 mmol/L (ref 3.5–5.1)
Sodium: 138 mmol/L (ref 135–145)

## 2016-06-18 LAB — CBC
HCT: 45.9 % (ref 35.0–47.0)
Hemoglobin: 15.5 g/dL (ref 12.0–16.0)
MCH: 32.1 pg (ref 26.0–34.0)
MCHC: 33.8 g/dL (ref 32.0–36.0)
MCV: 95 fL (ref 80.0–100.0)
Platelets: 211 10*3/uL (ref 150–440)
RBC: 4.83 MIL/uL (ref 3.80–5.20)
RDW: 13.8 % (ref 11.5–14.5)
WBC: 7.5 10*3/uL (ref 3.6–11.0)

## 2016-06-18 MED ORDER — SODIUM CHLORIDE 0.9 % IV BOLUS (SEPSIS)
1000.0000 mL | Freq: Once | INTRAVENOUS | Status: AC
  Administered 2016-06-18: 1000 mL via INTRAVENOUS

## 2016-06-18 MED ORDER — DICLOFENAC SODIUM 3 % TD GEL: 1.0000 "application " | Freq: Two times a day (BID) | TRANSDERMAL | 0 refills | Status: DC | PRN

## 2016-06-18 MED ORDER — KETOROLAC TROMETHAMINE 30 MG/ML IJ SOLN
30.0000 mg | Freq: Once | INTRAMUSCULAR | Status: AC
  Administered 2016-06-18: 30 mg via INTRAVENOUS
  Filled 2016-06-18: qty 1

## 2016-06-18 NOTE — ED Triage Notes (Signed)
Patient arrives via POV with complaint of left flank pain last night (acute onset without trauma). Denies difficulty urinating, denies radiation.

## 2016-06-18 NOTE — ED Provider Notes (Signed)
[facility] Emergency Department Provider Note  ____________________________________________   First MD Initiated Contact with Patient 06/18/16 1014     (approximate)  I have reviewed the triage vital signs and the nursing notes.   HISTORY  Chief Complaint Flank Pain   HPI Karina [name] is a 61 y.o. female with a history of COPD who is presenting with sudden onset left back and flank pain which started last night while sitting. She denies any recent strenuous activity or injury. Says the pain is a 10 out of 10 and worse with movement. She says that when she is sitting she is virtually painless. Denies any rash. No burning with urination. No history of kidney stones. Says that the pain is sharp and not associated with any nausea vomiting or diarrhea. Denies any radiation of the pain. Denies any numbness or weakness.  Past Medical History:  Diagnosis Date  . Allergic rhinitis   . Allergy   . Anxiety    Councelor- Pervis Hocking, no per pt  . Cataract   . Colon polyps 08/2008   Adenomatous  . COPD (chronic obstructive pulmonary disease) ([place])   . Depression    no per pt  . Diabetes mellitus   . Gastritis 08/2008   H. pylori  . GERD (gastroesophageal reflux disease)   . History of shingles    Left V1  . Hypercholesteremia   . Lung nodule   . Menopausal disorder   . Multiple sclerosis ([place])   . Neuromuscular disorder ([place])    Multiple sclerosis  . Plantar fasciitis   . Seizure disorder ([place])    single seizure/hypoglycemia  . Seizures ([place])    1 seizure in 2001 due to blood sugar dropping to 38, none since  . Urinary incontinence   . Vertigo     Patient Active Problem List   Diagnosis Date Noted  . Rash and nonspecific skin eruption 05/18/2016  . Cough 05/18/2016  . Abdominal pain, diffuse 05/18/2016  . Breast cyst, left 04/30/2016  . Breast pain, right 04/30/2016  . Routine general medical examination at a health care facility 05/06/2015    . Lump of breast, left 05/06/2015  . Large breasts 05/06/2015  . Acute bronchitis with bronchospasm 02/11/2015  . Lumbar disc disease 11/01/2014  . Knee pain, bilateral 03/11/2014  . Thoracic back pain 11/30/2013  . Encounter for routine gynecological examination 01/31/2013  . Encounter for Medicare annual wellness exam 01/23/2013  . Chest pain 09/20/2012  . SOB (shortness of breath) 09/20/2012  . Dyspnea 09/13/2012  . Asthmatic bronchitis , chronic ([place]) 12/06/2011  . Chronic cough 11/22/2011  . Anxiety disorder 07/17/2009  . HEMORRHOIDS-EXTERNAL 11/07/2008  . IRRITABLE BOWEL SYNDROME 11/07/2008  . LUNG NODULE 10/14/2008  . HYPERGLYCEMIA, BORDERLINE 10/14/2008  . PERSONAL HX COLONIC POLYPS 10/08/2008  . UNSPECIFIED VITAMIN D DEFICIENCY 07/23/2008  . SYNCOPE, HX OF 06/01/2008  . FOOT SURGERY, HX OF 06/01/2008  . DILATION AND CURETTAGE, HX OF 06/01/2008  . OSTEOARTHRITIS, SHOULDER, RIGHT 03/22/2008  . ROTATOR CUFF SYNDROME, RIGHT 03/22/2008  . ARTHRALGIA 12/12/2007  . Allergic rhinitis 08/01/2007  . Smoker 10/17/2006  . NEUROPATHY-PERIPHERAL 10/17/2006  . FOOT PAIN, BILATERAL 10/17/2006  . SYMPTOM, EDEMA 10/03/2006  . HYPOGLYCEMIA 09/30/2006  . HYPERCHOLESTEROLEMIA 09/30/2006  . MULTIPLE SCLEROSIS 09/30/2006  . GERD 09/30/2006  . FIBROCYSTIC BREAST DISEASE 09/30/2006  . SEIZURE DISORDER 09/30/2006  . DIZZINESS OR VERTIGO 09/30/2006  . URINARY INCONTINENCE 09/30/2006    Past Surgical History:  Procedure Laterality Date  .  CERVICAL DISCECTOMY  2004   x 2, Anterior; Fusion C4-5, C5-6, C6-7, Dr. Kary Kos  . CHEST CT  11/2008   With small 4 mm nodule L lung base (rec re check in 1 year)  . CHEST CT  07/2009   Re check chest CT - lung nodule stable  . CHOLECYSTECTOMY    . COLONOSCOPY  08/2008   Polyps/ re check 5 yrs  . CT SINUS LTD W/O CM  02/2009   negative  . DILATION AND CURETTAGE OF UTERUS    . ESOPHAGOGASTRODUODENOSCOPY  08/2008   Erosive gastritis, h pylori  (treated)  . FOOT SURGERY     left plantar fascial problem  . ROTATOR CUFF REPAIR     right  . TONSILLECTOMY    . TUBAL LIGATION    . UPPER GASTROINTESTINAL ENDOSCOPY      Prior to Admission medications   Medication Sig Start Date End Date Taking? Authorizing Provider  CRESTOR 20 MG tablet Take 1 tablet (20 mg total) by mouth daily. Patient taking differently: Take 10 mg by mouth daily.  08/19/15  Yes Minna Merritts, MD  gabapentin (NEURONTIN) 300 MG capsule TAKE 2 CAPSULES AT BEDTIME 03/23/16  Yes Ward Givens, NP  albuterol (VENTOLIN HFA) 108 (90 BASE) MCG/ACT inhaler INHALE 2 PUFFS UP TO EVERY 4 HOURS AS NEEDED FOR WHEEZING 02/11/15   Abner Greenspan, MD  ALPRAZolam Duanne Moron) 0.5 MG tablet Take 1 tablet (0.5 mg total) by mouth 2 (two) times daily as needed. For anxiety. 02/11/15   Abner Greenspan, MD  azelastine (ASTELIN) 0.1 % nasal spray Place 2 sprays into both nostrils 2 (two) times daily as needed. 09/20/14   Historical Provider, MD  Blood Glucose Monitoring Suppl (ACCU-CHEK AVIVA PLUS) w/Device KIT Check blood sugar once daily and as directed Dx R73.09 08/19/15   Abner Greenspan, MD  budesonide-formoterol [facility]) 160-4.5 MCG/ACT inhaler Inhale 2 puffs into the lungs 2 (two) times daily. 08/19/15   Marne A [name], MD  mometasone (ELOCON) 0.1 % cream Apply 1 application topically daily. Apply once daily to affected area as needed on forehead 05/18/16   Abner Greenspan, MD  omeprazole (PRILOSEC) 40 MG capsule Take 1 capsule (40 mg total) by mouth 2 (two) times daily. 05/18/16   Abner Greenspan, MD  Probiotic Product (ALIGN PO) Take by mouth daily.    Historical Provider, MD    Allergies Percocet [oxycodone-acetaminophen]; Tecfidera [dimethyl fumarate]; Amitriptyline hcl; Atorvastatin; Betaseron [interferon beta-1b]; Clarithromycin; Duloxetine; Levofloxacin; Pregabalin; Pseudoephedrine; and Zoloft [sertraline hcl]  Family History  Problem Relation Age of Onset  . Migraines Mother   . Heart  attack Mother   . Coronary artery disease Mother   . Coronary artery disease Father     6 bypasses   . Stroke Father   . Diabetes Father   . Renal Disease Father   . Colon cancer Maternal Grandfather 57  . Coronary artery disease Cousin   . Coronary artery disease Paternal Grandmother   . Lung cancer Paternal Aunt   . Multiple sclerosis Neg Hx   . Esophageal cancer Neg Hx   . Rectal cancer Neg Hx   . Stomach cancer Neg Hx     Social History Social History  Substance Use Topics  . Smoking status: Current Every Day Smoker    Packs/day: 1.00    Years: 30.00    Types: Cigarettes  . Smokeless tobacco: Never Used  . Alcohol use No    Review of Systems  Constitutional: No fever/chills Eyes: No visual changes. ENT: No sore throat. Cardiovascular: Denies chest pain. Respiratory: Denies shortness of breath. Gastrointestinal: No abdominal pain.  No nausea, no vomiting.  No diarrhea.  No constipation. Genitourinary: Negative for dysuria. Musculoskeletal: as above Skin: Negative for rash. Neurological: Negative for headaches, focal weakness or numbness.  10-point ROS otherwise negative.  ____________________________________________   PHYSICAL EXAM:  VITAL SIGNS: ED Triage Vitals [06/18/16 1001]  Enc Vitals Group     BP 140/69     Pulse Rate (!) 105     Resp (!) 22     Temp 97.7 F (36.5 C)     Temp Source Oral     SpO2 95 %     Weight 180 lb (81.6 kg)     Height '5\' 2"'$  (1.575 m)     Head Circumference      Peak Flow      Pain Score 10     Pain Loc      Pain Edu?      Excl. in [place]?     Constitutional: Alert and oriented. in no acute distress But lying on her right side to avoid pressure to the left flank and back. Eyes: Conjunctivae are normal. PERRL. EOMI. Head: Atraumatic. Nose: No congestion/rhinnorhea. Mouth/Throat: Mucous membranes are moist.   Neck: No stridor.   Cardiovascular: Normal rate, regular rhythm. Grossly normal heart sounds.   Respiratory:  Normal respiratory effort.  No retractions. Lungs CTAB. Gastrointestinal: Soft and nontender. No distention. Positive for left-sided CVA tenderness to palpation.  Patient with pain with moving for evaluation. Musculoskeletal: No lower extremity tenderness nor edema.  No joint effusions. Neurologic:  Normal speech and language. No gross focal neurologic deficits are appreciated.  Skin:  Skin is warm, dry and intact. No rash noted. Psychiatric: Mood and affect are normal. Speech and behavior are normal.  ____________________________________________   LABS (all labs ordered are listed, but only abnormal results are displayed)  Labs Reviewed  URINALYSIS, COMPLETE (UACMP) WITH MICROSCOPIC - Abnormal; Notable for the following:       Result Value   Color, Urine YELLOW (*)    APPearance CLOUDY (*)    Squamous Epithelial / LPF 0-5 (*)    All other components within normal limits  BASIC METABOLIC PANEL - Abnormal; Notable for the following:    Glucose, Bld 158 (*)    Creatinine, Ser 1.13 (*)    GFR calc non Af Amer 53 (*)    All other components within normal limits  CBC   ____________________________________________  EKG   ____________________________________________  RADIOLOGY  CT RENAL STONE STUDY (Final result)  Result time 06/18/16 11:51:19  Final result by Logan Bores, MD (06/18/16 11:51:19)           Narrative:   CLINICAL DATA: Left flank pain, acute onset last night.  EXAM: CT ABDOMEN AND PELVIS WITHOUT CONTRAST  TECHNIQUE: Multidetector CT imaging of the abdomen and pelvis was performed following the standard protocol without IV contrast.  COMPARISON: 10/11/2008  FINDINGS: Lower chest: 4 mm ground-glass subpleural nodule in the left lower lobe has not significantly changed in size and is considered benign (series 4, image 9).  Hepatobiliary: No focal liver abnormality is seen. Status post cholecystectomy. No biliary dilatation.  Pancreas:  Unremarkable.  Spleen: Unremarkable. Tiny low-density lesion on the prior study is not apparent on this unenhanced examination.  Adrenals/Urinary Tract: Unremarkable adrenal glands. No evidence of renal mass, calculi, or hydronephrosis. No ureteral dilatation. Unremarkable bladder.  Stomach/Bowel: The stomach is within normal limits. There is no evidence of bowel obstruction or inflammation. Unremarkable appendix.  Vascular/Lymphatic: Mild abdominal aortic atherosclerosis without aneurysm. No enlarged lymph nodes.  Reproductive: 8 mm coarse calcification in the uterus is unchanged and may represent a small fibroid. Unremarkable adnexa.  Other: No intraperitoneal free fluid. No abdominal wall hernia. Numerous calcified injection granulomata are again noted in the subcutaneous tissues bilaterally.  Musculoskeletal: Mild lower lumbar facet arthrosis. No acute osseous abnormality or suspicious osseous lesion.  IMPRESSION: 1. No urinary tract calculi or acute abnormality identified. 2. Unchanged left lower lobe lung nodule. 3. Aortic atherosclerosis.   Electronically Signed By: Logan Bores M.D. On: 06/18/2016 11:51          ____________________________________________   PROCEDURES  Procedure(s) performed:   Procedures  Critical Care performed:   ____________________________________________   INITIAL IMPRESSION / ASSESSMENT AND PLAN / ED COURSE  Pertinent labs & imaging results that were available during my care of the patient were reviewed by me and considered in my medical decision making (see chart for details).  ----------------------------------------- 12:17 PM on 06/18/2016 -----------------------------------------  Patient feeling improved this time. No obvious cause for her pain on workup. However, on her exam does appear that it is most likely musculoskeletal. I'll be discharging her with diclofenac gel. She'll be following up in the office with  primary care. To return for any worsening or concerning symptoms. She is understanding of this plan and willing to comply.      ____________________________________________   FINAL CLINICAL IMPRESSION(S) / ED DIAGNOSES  Final diagnoses:  Left flank pain      NEW MEDICATIONS STARTED DURING THIS VISIT:  New Prescriptions   No medications on file     Note:  This document was prepared using Dragon voice recognition software and may include unintentional dictation errors.    Orbie Pyo, MD 06/18/16 [phone]

## 2016-08-28 ENCOUNTER — Telehealth: Payer: Self-pay | Admitting: Family Medicine

## 2016-08-28 DIAGNOSIS — E559 Vitamin D deficiency, unspecified: Secondary | ICD-10-CM

## 2016-08-28 DIAGNOSIS — Z0000 Encounter for general adult medical examination without abnormal findings: Secondary | ICD-10-CM

## 2016-08-28 DIAGNOSIS — R7309 Other abnormal glucose: Secondary | ICD-10-CM

## 2016-08-28 NOTE — Telephone Encounter (Signed)
-----   Message from Eustace Pen, LPN sent at 2/58/5277  6:18 PM EDT ----- Regarding: Labs 6/27 [employer]  Please place lab orders.

## 2016-09-01 ENCOUNTER — Ambulatory Visit (INDEPENDENT_AMBULATORY_CARE_PROVIDER_SITE_OTHER): Payer: Medicare HMO | Admitting: Family Medicine

## 2016-09-01 ENCOUNTER — Ambulatory Visit (INDEPENDENT_AMBULATORY_CARE_PROVIDER_SITE_OTHER): Payer: Medicare HMO

## 2016-09-01 VITALS — BP 124/80 | HR 86 | Temp 97.8°F | Ht 61.0 in | Wt 185.8 lb

## 2016-09-01 DIAGNOSIS — E559 Vitamin D deficiency, unspecified: Secondary | ICD-10-CM | POA: Diagnosis not present

## 2016-09-01 DIAGNOSIS — R7309 Other abnormal glucose: Secondary | ICD-10-CM | POA: Diagnosis not present

## 2016-09-01 DIAGNOSIS — R220 Localized swelling, mass and lump, head: Secondary | ICD-10-CM

## 2016-09-01 DIAGNOSIS — Z0000 Encounter for general adult medical examination without abnormal findings: Secondary | ICD-10-CM | POA: Diagnosis not present

## 2016-09-01 LAB — COMPREHENSIVE METABOLIC PANEL
ALT: 13 U/L (ref 0–35)
AST: 17 U/L (ref 0–37)
Albumin: 4.2 g/dL (ref 3.5–5.2)
Alkaline Phosphatase: 70 U/L (ref 39–117)
BUN: 7 mg/dL (ref 6–23)
CO2: 33 mEq/L — ABNORMAL HIGH (ref 19–32)
Calcium: 10.3 mg/dL (ref 8.4–10.5)
Chloride: 102 mEq/L (ref 96–112)
Creatinine, Ser: 0.98 mg/dL (ref 0.40–1.20)
GFR: 61.82 mL/min (ref >60.00)
Glucose, Bld: 100 mg/dL — ABNORMAL HIGH (ref 70–99)
Potassium: 4.3 mEq/L (ref 3.5–5.1)
Sodium: 139 mEq/L (ref 135–145)
Total Bilirubin: 0.4 mg/dL (ref 0.2–1.2)
Total Protein: 7.2 g/dL (ref 6.0–8.3)

## 2016-09-01 LAB — CBC WITH DIFFERENTIAL/PLATELET
Basophils Absolute: 0.1 10*3/uL (ref 0.0–0.1)
Basophils Relative: 1.2 % (ref 0.0–3.0)
Eosinophils Absolute: 0.1 10*3/uL (ref 0.0–0.7)
Eosinophils Relative: 1.2 % (ref 0.0–5.0)
HCT: 43.2 % (ref 36.0–46.0)
Hemoglobin: 14.7 g/dL (ref 12.0–15.0)
Lymphocytes Relative: 33.7 % (ref 12.0–46.0)
Lymphs Abs: 3 10*3/uL (ref 0.7–4.0)
MCHC: 34 g/dL (ref 30.0–36.0)
MCV: 95 fl (ref 78.0–100.0)
Monocytes Absolute: 0.6 10*3/uL (ref 0.1–1.0)
Monocytes Relative: 6.2 % (ref 3.0–12.0)
Neutro Abs: 5.1 10*3/uL (ref 1.4–7.7)
Neutrophils Relative %: 57.7 % (ref 43.0–77.0)
Platelets: 222 10*3/uL (ref 150.0–400.0)
RBC: 4.55 Mil/uL (ref 3.87–5.11)
RDW: 13.8 % (ref 11.5–15.5)
WBC: 8.9 10*3/uL (ref 4.0–10.5)

## 2016-09-01 LAB — LIPID PANEL
Cholesterol: 266 mg/dL — ABNORMAL HIGH (ref 0–200)
HDL: 62.2 mg/dL (ref >39.00)
LDL Cholesterol: 178 mg/dL — ABNORMAL HIGH (ref 0–99)
NonHDL: 203.52
Total CHOL/HDL Ratio: 4
Triglycerides: 130 mg/dL (ref 0.0–149.0)
VLDL: 26 mg/dL (ref 0.0–40.0)

## 2016-09-01 LAB — TSH: TSH: 1.53 u[IU]/mL (ref 0.35–4.50)

## 2016-09-01 LAB — VITAMIN D 25 HYDROXY (VIT D DEFICIENCY, FRACTURES): VITD: 31.41 ng/mL (ref 30.00–100.00)

## 2016-09-01 LAB — HEMOGLOBIN A1C: Hgb A1c MFr Bld: 5.9 % (ref 4.6–6.5)

## 2016-09-01 NOTE — Progress Notes (Signed)
Pre visit review using our clinic review tool, if applicable. No additional management support is needed unless otherwise documented below in the visit note. 

## 2016-09-01 NOTE — Progress Notes (Signed)
Subjective:   Karina [name] is a 61 y.o. female who presents for Medicare Annual (Subsequent) preventive examination.  Review of Systems:  N/A Cardiac Risk Factors include: obesity (BMI >30kg/m2);smoking/ tobacco exposure;dyslipidemia     Objective:     Vitals: BP 124/80 (BP Location: Right Arm, Patient Position: Sitting, Cuff Size: Normal)   Pulse 86   Temp 97.8 F (36.6 C) (Oral)   Ht _0  (1.549 m) Comment: NO SHOES  Wt 185 lb 12 oz (84.3 kg)   SpO2 94%   BMI 35.10 kg/m   Body mass index is 35.1 kg/m.   Tobacco History  Smoking Status  . Current Every Day Smoker  . Packs/day: 1.00  . Years: 30.00  . Types: Cigarettes  Smokeless Tobacco  . Never Used     Ready to quit: Yes Counseling given: No   Past Medical History:  Diagnosis Date  . Allergic rhinitis   . Allergy   . Anxiety    Councelor- Pervis Hocking, no per pt  . Cataract   . Colon polyps 08/2008   Adenomatous  . COPD (chronic obstructive pulmonary disease) ([place])   . Depression    no per pt  . Diabetes mellitus   . Gastritis 08/2008   H. pylori  . GERD (gastroesophageal reflux disease)   . History of shingles    Left V1  . Hypercholesteremia   . Lung nodule   . Menopausal disorder   . Multiple sclerosis ([place])   . Neuromuscular disorder ([place])    Multiple sclerosis  . Plantar fasciitis   . Seizure disorder ([place])    single seizure/hypoglycemia  . Seizures ([place])    1 seizure in 2001 due to blood sugar dropping to 38, none since  . Urinary incontinence   . Vertigo    Past Surgical History:  Procedure Laterality Date  . CERVICAL DISCECTOMY  2004   x 2, Anterior; Fusion C4-5, C5-6, C6-7, Dr. Kary Kos  . CHEST CT  11/2008   With small 4 mm nodule L lung base (rec re check in 1 year)  . CHEST CT  07/2009   Re check chest CT - lung nodule stable  . CHOLECYSTECTOMY    . COLONOSCOPY  08/2008   Polyps/ re check 5 yrs  . CT SINUS LTD W/O CM  02/2009   negative  . DILATION AND CURETTAGE  OF UTERUS    . ESOPHAGOGASTRODUODENOSCOPY  08/2008   Erosive gastritis, h pylori (treated)  . FOOT SURGERY     left plantar fascial problem  . ROTATOR CUFF REPAIR     right  . TONSILLECTOMY    . TUBAL LIGATION    . UPPER GASTROINTESTINAL ENDOSCOPY     Family History  Problem Relation Age of Onset  . Migraines Mother   . Heart attack Mother   . Coronary artery disease Mother   . Coronary artery disease Father        6 bypasses   . Stroke Father   . Diabetes Father   . Renal Disease Father   . Colon cancer Maternal Grandfather 64  . Coronary artery disease Cousin   . Coronary artery disease Paternal Grandmother   . Lung cancer Paternal Aunt   . Multiple sclerosis Neg Hx   . Esophageal cancer Neg Hx   . Rectal cancer Neg Hx   . Stomach cancer Neg Hx    History  Sexual Activity  . Sexual activity: Not on file    Outpatient  Encounter Prescriptions as of 09/01/2016  Medication Sig  . albuterol (VENTOLIN HFA) 108 (90 BASE) MCG/ACT inhaler INHALE 2 PUFFS UP TO EVERY 4 HOURS AS NEEDED FOR WHEEZING  . ALPRAZolam (XANAX) 0.5 MG tablet Take 1 tablet (0.5 mg total) by mouth 2 (two) times daily as needed. For anxiety.  Marland Kitchen azelastine (ASTELIN) 0.1 % nasal spray Place 2 sprays into both nostrils 2 (two) times daily as needed.  . Blood Glucose Monitoring Suppl (ACCU-CHEK AVIVA PLUS) w/Device KIT Check blood sugar once daily and as directed Dx R73.09  . budesonide-formoterol (SYMBICORT) 160-4.5 MCG/ACT inhaler Inhale 2 puffs into the lungs 2 (two) times daily.  . CRESTOR 20 MG tablet Take 1 tablet (20 mg total) by mouth daily. (Patient taking differently: Take 10 mg by mouth daily. )  . Diclofenac Sodium 3 % GEL Place 1 application onto the skin every 12 (twelve) hours as needed.  . gabapentin (NEURONTIN) 300 MG capsule TAKE 2 CAPSULES AT BEDTIME  . mometasone (ELOCON) 0.1 % cream Apply 1 application topically daily. Apply once daily to affected area as needed on forehead  . omeprazole  (PRILOSEC) 40 MG capsule Take 1 capsule (40 mg total) by mouth 2 (two) times daily.  . Probiotic Product (ALIGN PO) Take by mouth daily.   No facility-administered encounter medications on file as of 09/01/2016.     Activities of Daily Living In your present state of health, do you have any difficulty performing the following activities: 09/01/2016  Hearing? Y  Vision? Y  Difficulty concentrating or making decisions? Y  Walking or climbing stairs? Y  Dressing or bathing? N  Doing errands, shopping? N  Preparing Food and eating ? N  Using the Toilet? N  In the past six months, have you accidently leaked urine? Y  Do you have problems with loss of bowel control? Y  Managing your Medications? N  Managing your Finances? N  Housekeeping or managing your Housekeeping? N  Some recent data might be hidden    Patient Care Team: [name], Wynelle Fanny, MD as PCP - General Kathrynn Ducking, MD as Consulting Physician (Neurology) Minna Merritts, MD as Consulting Physician (Cardiology)    Assessment:     Hearing Screening   _0  _1  _2  _3  _4  _5  _6  _7  _8   Right ear:   0 0 40  40    Left ear:   0 0 40  40    Vision Screening Comments: Last vision exam in 2017 @ [facility]  Exercise Activities and Dietary recommendations Current Exercise Habits: The patient does not participate in regular exercise at present, Exercise limited by: cardiac condition(s);respiratory conditions(s)  Goals    . Weight < 180 lb (81.647 kg)          Pt desires to lose weight by increasing the amount of physical activity she does once SOB, back pain, and fatigue are resolved.       Fall Risk Fall Risk  09/01/2016 04/30/2015 01/31/2013  Falls in the past year? No No Yes  Number falls in past yr: - - 1  Injury with Fall? - - Yes   Depression Screen PHQ 2/9 Scores 09/01/2016 04/30/2015 01/31/2013  PHQ - 2 Score 0 0 2     Cognitive Function MMSE - Mini Mental State Exam  09/01/2016 04/30/2015  Orientation to time 5 5  Orientation to Place 5 5  Registration 3 3  Attention/ Calculation 0 5  Recall 3 3  Language- name 2 objects  0 -  Language- repeat 1 1  Language- follow 3 step command 3 3  Language- read & follow direction 0 1  Write a sentence 0 -  Copy design 0 -  Total score 20 -       PLEASE NOTE: A Mini-Cog screen was completed. Maximum score is 20. A value of 0 denotes this part of [place] MMSE was not completed or the patient failed this part of the Mini-Cog screening.   Mini-Cog Screening Orientation to Time - Max 5 pts Orientation to Place - Max 5 pts Registration - Max 3 pts Recall - Max 3 pts Language Repeat - Max 1 pts Language Follow 3 Step Command - Max 3 pts   Immunization History  Administered Date(s) Administered  . Influenza,inj,Quad PF,36+ Mos 01/31/2013, 03/11/2014  . Pneumococcal Polysaccharide-23 07/29/2003  . Td 03/08/2000  . Tdap 01/31/2013   Screening Tests Health Maintenance  Topic Date Due  . INFLUENZA VACCINE  10/06/2016  . PAP SMEAR  05/05/2018  . MAMMOGRAM  05/07/2018  . COLONOSCOPY  10/12/2018  . TETANUS/TDAP  02/01/2023  . Hepatitis C Screening  Completed  . HIV Screening  Completed      Plan:     I have personally reviewed and addressed the Medicare Annual Wellness questionnaire and have noted the following in the patient's chart:  A. Medical and social history B. Use of alcohol, tobacco or illicit drugs  C. Current medications and supplements D. Functional ability and status E.  Nutritional status F.  Physical activity G. Advance directives H. List of other physicians I.  Hospitalizations, surgeries, and ER visits in previous 12 months J.  [place] to include hearing, vision, cognitive, depression L. Referrals and appointments - none  In addition, I have reviewed and discussed with patient certain preventive protocols, quality metrics, and best practice recommendations. A written  personalized care plan for preventive services as well as general preventive health recommendations were provided to patient.  See attached scanned questionnaire for additional information.   Signed,   Lindell Noe, MHA, BS, LPN Health Coach

## 2016-09-01 NOTE — Progress Notes (Signed)
124

## 2016-09-01 NOTE — Progress Notes (Signed)
PCP notes:   Health maintenance:  No gaps identified.   Abnormal screenings:   Hearing - failed  Patient concerns:   Pt presented a list of multiple health concerns to nurse. Pt was asked to share which concern was the most most acute. Pt stated the swollen area on the back of her head because it was affecting her vision. Same day appt scheduled with Dr. Lorelei Pont for further evaluation. Pt was scheduled a 30 min OV on 09/07/16 with PCP to discuss other health concerns.   Nurse concerns:  None  Next PCP appt:   09/07/16 @ 1400

## 2016-09-01 NOTE — Progress Notes (Signed)
Dr. Frederico Hamman T. Analee Montee, MD, [facility] [place], [address] Phone: [phone] Fax: [phone]  09/01/2016  Patient: Karina [name], MRN: [id number], DOB: 1956-02-03, 61 y.o.  Primary Physician:  [name], Wynelle Fanny, MD   No chief complaint on file.  Subjective:   Karina [name] is a 61 y.o. very pleasant female patient who presents with the following:  Place on head: L  I remember this patient well who is the daughter of one of my former patients.  I was asked to see her emergently for evaluation of swelling on the posterior aspect of her head.  On reflection, this problem is actually been present for 2 years and it is been waxing and waning since then.  Currently he does not really swollen, but there is some pain associated with the region.  She is not having any fevers, chills, sweats, or other systemic signs of infection.  Recent CBC was entirely normal.  She has a wealth of other complaints that don't relate to this emergent evaluation, and I'm going to have her follow-up with her primary care provider to discuss them.  Past Medical History, Surgical History, Social History, Family History, Problem List, Medications, and Allergies have been reviewed and updated if relevant.  Patient Active Problem List   Diagnosis Date Noted  . Rash and nonspecific skin eruption 05/18/2016  . Cough 05/18/2016  . Abdominal pain, diffuse 05/18/2016  . Breast cyst, left 04/30/2016  . Breast pain, right 04/30/2016  . Routine general medical examination at a health care facility 05/06/2015  . Lump of breast, left 05/06/2015  . Large breasts 05/06/2015  . Acute bronchitis with bronchospasm 02/11/2015  . Lumbar disc disease 11/01/2014  . Knee pain, bilateral 03/11/2014  . Thoracic back pain 11/30/2013  . Encounter for routine gynecological examination 01/31/2013  . Encounter for Medicare annual wellness exam 01/23/2013  . Chest  pain 09/20/2012  . SOB (shortness of breath) 09/20/2012  . Dyspnea 09/13/2012  . Asthmatic bronchitis , chronic ([place]) 12/06/2011  . Chronic cough 11/22/2011  . Anxiety disorder 07/17/2009  . HEMORRHOIDS-EXTERNAL 11/07/2008  . IRRITABLE BOWEL SYNDROME 11/07/2008  . LUNG NODULE 10/14/2008  . HYPERGLYCEMIA, BORDERLINE 10/14/2008  . PERSONAL HX COLONIC POLYPS 10/08/2008  . Vitamin D deficiency 07/23/2008  . SYNCOPE, HX OF 06/01/2008  . FOOT SURGERY, HX OF 06/01/2008  . DILATION AND CURETTAGE, HX OF 06/01/2008  . OSTEOARTHRITIS, SHOULDER, RIGHT 03/22/2008  . ROTATOR CUFF SYNDROME, RIGHT 03/22/2008  . ARTHRALGIA 12/12/2007  . Allergic rhinitis 08/01/2007  . Smoker 10/17/2006  . NEUROPATHY-PERIPHERAL 10/17/2006  . FOOT PAIN, BILATERAL 10/17/2006  . SYMPTOM, EDEMA 10/03/2006  . HYPOGLYCEMIA 09/30/2006  . HYPERCHOLESTEROLEMIA 09/30/2006  . MULTIPLE SCLEROSIS 09/30/2006  . GERD 09/30/2006  . FIBROCYSTIC BREAST DISEASE 09/30/2006  . SEIZURE DISORDER 09/30/2006  . DIZZINESS OR VERTIGO 09/30/2006  . URINARY INCONTINENCE 09/30/2006    Past Medical History:  Diagnosis Date  . Allergic rhinitis   . Allergy   . Anxiety    [place] [place], no per pt  . Cataract   . Colon polyps 08/2008   Adenomatous  . COPD (chronic obstructive pulmonary disease) ([place])   . Depression    no per pt  . Diabetes mellitus   . Gastritis 08/2008   H. pylori  . GERD (gastroesophageal reflux disease)   . History of shingles    Left V1  . Hypercholesteremia   . Lung nodule   .  Menopausal disorder   . Multiple sclerosis ([place])   . Neuromuscular disorder ([place])    Multiple sclerosis  . Plantar fasciitis   . Seizure disorder ([place])    single seizure/hypoglycemia  . Seizures ([place])    1 seizure in 2001 due to blood sugar dropping to 38, none since  . Urinary incontinence   . Vertigo     Past Surgical History:  Procedure Laterality Date  . CERVICAL DISCECTOMY  2004   x 2, Anterior; Fusion C4-5,  C5-6, C6-7, Dr. Kary Kos  . CHEST CT  11/2008   With small 4 mm nodule L lung base (rec re check in 1 year)  . CHEST CT  07/2009   Re check chest CT - lung nodule stable  . CHOLECYSTECTOMY    . COLONOSCOPY  08/2008   Polyps/ re check 5 yrs  . CT SINUS LTD W/O CM  02/2009   negative  . DILATION AND CURETTAGE OF UTERUS    . ESOPHAGOGASTRODUODENOSCOPY  08/2008   Erosive gastritis, h pylori (treated)  . FOOT SURGERY     left plantar fascial problem  . ROTATOR CUFF REPAIR     right  . TONSILLECTOMY    . TUBAL LIGATION    . UPPER GASTROINTESTINAL ENDOSCOPY      Social History   Social History  . Marital status: Married    Spouse name: N/A  . Number of children: 3  . Years of education: 9 th   Occupational History  . Disabled  Disabled   Social History Main Topics  . Smoking status: Current Every Day Smoker    Packs/day: 1.00    Years: 30.00    Types: Cigarettes  . Smokeless tobacco: Never Used  . Alcohol use No  . Drug use: No  . Sexual activity: Not on file   Other Topics Concern  . Not on file   Social History Narrative   Married with 3 children   Daily caffeine use - 2    Disabled.   Education 9th grade    Caffeine one cup of coffee daily.   Patient is right handed.     Family History  Problem Relation Age of Onset  . Migraines Mother   . Heart attack Mother   . Coronary artery disease Mother   . Coronary artery disease Father        6 bypasses   . Stroke Father   . Diabetes Father   . Renal Disease Father   . Colon cancer Maternal Grandfather 69  . Coronary artery disease Cousin   . Coronary artery disease Paternal Grandmother   . Lung cancer Paternal Aunt   . Multiple sclerosis Neg Hx   . Esophageal cancer Neg Hx   . Rectal cancer Neg Hx   . Stomach cancer Neg Hx     Allergies  Allergen Reactions  . Percocet [Oxycodone-Acetaminophen] Shortness Of Breath    Felt like going to pass out and sweating  . Tecfidera [Dimethyl Fumarate] Shortness  Of Breath, Palpitations, Rash and Cough  . Amitriptyline Hcl     REACTION: swelling and itching  . Atorvastatin     REACTION: elevated LFT's  . Betaseron [Interferon Beta-1b]     Headache  . Clarithromycin     REACTION: reaction not known  . Duloxetine     REACTION: pain  . Levofloxacin     REACTION: Rash  . Pregabalin     REACTION: LE swelling  . Pseudoephedrine  REACTION: legs hurt  . Zoloft [Sertraline Hcl] Other (See Comments)    Headaches     Medication list reviewed and updated in full in [facility].   GEN: No acute illnesses, no fevers, chills. GI: No n/v/d, eating normally Pulm: No SOB Interactive and getting along well at home.  Otherwise, ROS is as per the HPI.  Objective:   BP 124/80 (BP Location: Right Arm, Patient Position: Sitting, Cuff Size: Normal)   Pulse 86   Temp 97.8 F (36.6 C) (Oral)   Ht '5\' 1"'$  (1.549 m) Comment: no shoes  Wt 185 lb 12 oz (84.3 kg)   SpO2 94%   BMI 35.10 kg/m   GEN: WDWN, NAD, Non-toxic, A & O x 3 HEENT: Atraumatic, Normocephalic. Neck supple. No masses, No LAD. Ears and Nose: No external deformity. EXTR: No c/c/e NEURO Normal gait.  PSYCH: Normally interactive. Conversant. Not depressed or anxious appearing.  Calm demeanor.   Head and skin, posterior skull.  Well examined, hair moved out of the way to visualize the skin.  There appears to be no topical change to the skin at all.  There is no induration or fluctuance at all.  On the left side there is a very small lymph node in the area that the patient is concerned about.  There is no signs of trauma, and there is no bruising.  Laboratory and Imaging Data:  Assessment and Plan:   Swelling of head  I do not see any swelling.  There is no signs of infection or trauma.  There may be a small lymph node this there, but I do not see anything that is concerning at all. I did my best to reassure her.  She is having some pain and headaches, and I think it would be  reasonable to follow-up as neurology has suggested for her MRI of the brain given her chronic neurological conditions.  Follow-up: No Follow-up on file.  Future Appointments Date Time Provider [place]  09/07/2016 2:00 PM [name], Wynelle Fanny, MD [facility]  09/10/2016 11:30 AM [name], Wynelle Fanny, MD [facility]  09/21/2016 11:30 AM Ward Givens, NP [facility] None   Signed,  Maud Deed. Shaleah Nissley, MD   Allergies as of 09/01/2016      Reactions   Percocet [oxycodone-acetaminophen] Shortness Of Breath   Felt like going to pass out and sweating   Tecfidera [dimethyl Fumarate] Shortness Of Breath, Palpitations, Rash, Cough   Amitriptyline Hcl    REACTION: swelling and itching   Atorvastatin    REACTION: elevated LFT's   Betaseron [interferon Beta-1b]    Headache   Clarithromycin    REACTION: reaction not known   Duloxetine    REACTION: pain   Levofloxacin    REACTION: Rash   Pregabalin    REACTION: LE swelling   Pseudoephedrine    REACTION: legs hurt   Zoloft [sertraline Hcl] Other (See Comments)   Headaches      Medication List       Accurate as of 09/01/16 11:59 PM. Always use your most recent med list.          ACCU-CHEK AVIVA PLUS w/Device Kit Check blood sugar once daily and as directed Dx R73.09   albuterol 108 (90 Base) MCG/ACT inhaler Commonly known as:  VENTOLIN HFA INHALE 2 PUFFS UP TO EVERY 4 HOURS AS NEEDED FOR WHEEZING   ALIGN PO Take by mouth daily.   ALPRAZolam 0.5 MG tablet Commonly known as:  XANAX Take 1  tablet (0.5 mg total) by mouth 2 (two) times daily as needed. For anxiety.   azelastine 0.1 % nasal spray Commonly known as:  ASTELIN Place 2 sprays into both nostrils 2 (two) times daily as needed.   budesonide-formoterol 160-4.5 MCG/ACT inhaler Commonly known as:  SYMBICORT Inhale 2 puffs into the lungs 2 (two) times daily.   CRESTOR 20 MG tablet Generic drug:  rosuvastatin Take 1 tablet (20 mg total) by mouth daily.    Diclofenac Sodium 3 % Gel Place 1 application onto the skin every 12 (twelve) hours as needed.   gabapentin 300 MG capsule Commonly known as:  NEURONTIN TAKE 2 CAPSULES AT BEDTIME   mometasone 0.1 % cream Commonly known as:  ELOCON Apply 1 application topically daily. Apply once daily to affected area as needed on forehead   omeprazole 40 MG capsule Commonly known as:  PRILOSEC Take 1 capsule (40 mg total) by mouth 2 (two) times daily.

## 2016-09-01 NOTE — Patient Instructions (Signed)
Ms. Mikrut , Thank you for taking time to come for your Medicare Wellness Visit. I appreciate your ongoing commitment to your health goals. Please review the following plan we discussed and let me know if I can assist you in the future.   These are the goals we discussed: Goals    . Weight < 180 lb (81.647 kg)          Pt desires to lose weight by increasing the amount of physical activity she does once SOB, back pain, and fatigue are resolved.        This is a list of the screening recommended for you and due dates:  Health Maintenance  Topic Date Due  . Flu Shot  10/06/2016  . Pap Smear  05/05/2018  . Mammogram  05/07/2018  . Colon Cancer Screening  10/12/2018  . Tetanus Vaccine  02/01/2023  .  Hepatitis C: One time screening is recommended by Center for Disease Control  (CDC) for  adults born from [birth year] through 1963.   Completed  . HIV Screening  Completed   Preventive Care for Adults  A healthy lifestyle and preventive care can promote health and wellness. Preventive health guidelines for adults include the following key practices.  . A routine yearly physical is a good way to check with your health care provider about your health and preventive screening. It is a chance to share any concerns and updates on your health and to receive a thorough exam.  . Visit your dentist for a routine exam and preventive care every 6 months. Brush your teeth twice a day and floss once a day. Good oral hygiene prevents tooth decay and gum disease.  . The frequency of eye exams is based on your age, health, family medical history, use  of contact lenses, and other factors. Follow your health care provider's ecommendations for frequency of eye exams.  . Eat a healthy diet. Foods like vegetables, fruits, whole grains, low-fat dairy products, and lean protein foods contain the nutrients you need without too many calories. Decrease your intake of foods high in solid fats, added sugars, and salt. Eat  the right amount of calories for you. Get information about a proper diet from your health care provider, if necessary.  . Regular physical exercise is one of the most important things you can do for your health. Most adults should get at least 150 minutes of moderate-intensity exercise (any activity that increases your heart rate and causes you to sweat) each week. In addition, most adults need muscle-strengthening exercises on 2 or more days a week.  Silver Sneakers may be a benefit available to you. To determine eligibility, you may visit the website: www.silversneakers.com or contact program at 743-389-6772 Mon-Fri between 8AM-8PM.   . Maintain a healthy weight. The body mass index (BMI) is a screening tool to identify possible weight problems. It provides an estimate of body fat based on height and weight. Your health care provider can find your BMI and can help you achieve or maintain a healthy weight.   For adults 20 years and older: ? A BMI below 18.5 is considered underweight. ? A BMI of 18.5 to 24.9 is normal. ? A BMI of 25 to 29.9 is considered overweight. ? A BMI of 30 and above is considered obese.   . Maintain normal blood lipids and cholesterol levels by exercising and minimizing your intake of saturated fat. Eat a balanced diet with plenty of fruit and vegetables. Blood tests for lipids  and cholesterol should begin at age 63 and be repeated every 5 years. If your lipid or cholesterol levels are high, you are over 50, or you are at high risk for heart disease, you may need your cholesterol levels checked more frequently. Ongoing high lipid and cholesterol levels should be treated with medicines if diet and exercise are not working.  . If you smoke, find out from your health care provider how to quit. If you do not use tobacco, please do not start.  . If you choose to drink alcohol, please do not consume more than 2 drinks per day. One drink is considered to be 12 ounces (355 mL)  of beer, 5 ounces (148 mL) of wine, or 1.5 ounces (44 mL) of liquor.  . If you are 38-30 years old, ask your health care provider if you should take aspirin to prevent strokes.  . Use sunscreen. Apply sunscreen liberally and repeatedly throughout the day. You should seek shade when your shadow is shorter than you. Protect yourself by wearing long sleeves, pants, a wide-brimmed hat, and sunglasses year round, whenever you are outdoors.  . Once a month, do a whole body skin exam, using a mirror to look at the skin on your back. Tell your health care provider of new moles, moles that have irregular borders, moles that are larger than a pencil eraser, or moles that have changed in shape or color.

## 2016-09-02 NOTE — Progress Notes (Signed)
I reviewed health advisor's note, was available for consultation, and agree with documentation and plan.   Signed,  Johm Pfannenstiel T. Shenika Quint, MD  

## 2016-09-07 ENCOUNTER — Ambulatory Visit (INDEPENDENT_AMBULATORY_CARE_PROVIDER_SITE_OTHER): Payer: Medicare HMO | Admitting: Family Medicine

## 2016-09-07 ENCOUNTER — Encounter: Payer: Self-pay | Admitting: Family Medicine

## 2016-09-07 ENCOUNTER — Ambulatory Visit (INDEPENDENT_AMBULATORY_CARE_PROVIDER_SITE_OTHER)
Admission: RE | Admit: 2016-09-07 | Discharge: 2016-09-07 | Disposition: A | Discharge status: Home / Self Care | Payer: Medicare HMO | Source: Ambulatory Visit | Attending: Family Medicine | Admitting: Family Medicine

## 2016-09-07 VITALS — BP 118/68 | HR 73 | Temp 98.3°F | Ht 61.0 in | Wt 186.2 lb

## 2016-09-07 DIAGNOSIS — M79641 Pain in right hand: Secondary | ICD-10-CM

## 2016-09-07 DIAGNOSIS — M19042 Primary osteoarthritis, left hand: Secondary | ICD-10-CM | POA: Diagnosis not present

## 2016-09-07 DIAGNOSIS — R1084 Generalized abdominal pain: Secondary | ICD-10-CM | POA: Diagnosis not present

## 2016-09-07 DIAGNOSIS — R05 Cough: Secondary | ICD-10-CM | POA: Diagnosis not present

## 2016-09-07 DIAGNOSIS — J449 Chronic obstructive pulmonary disease, unspecified: Secondary | ICD-10-CM | POA: Diagnosis not present

## 2016-09-07 DIAGNOSIS — R1313 Dysphagia, pharyngeal phase: Secondary | ICD-10-CM

## 2016-09-07 DIAGNOSIS — R059 Cough, unspecified: Secondary | ICD-10-CM

## 2016-09-07 DIAGNOSIS — Z87891 Personal history of nicotine dependence: Secondary | ICD-10-CM | POA: Diagnosis not present

## 2016-09-07 DIAGNOSIS — M19049 Primary osteoarthritis, unspecified hand: Secondary | ICD-10-CM | POA: Diagnosis not present

## 2016-09-07 DIAGNOSIS — R5383 Other fatigue: Secondary | ICD-10-CM | POA: Insufficient documentation

## 2016-09-07 DIAGNOSIS — M79642 Pain in left hand: Secondary | ICD-10-CM

## 2016-09-07 DIAGNOSIS — R5382 Chronic fatigue, unspecified: Secondary | ICD-10-CM

## 2016-09-07 DIAGNOSIS — E559 Vitamin D deficiency, unspecified: Secondary | ICD-10-CM | POA: Diagnosis not present

## 2016-09-07 DIAGNOSIS — G35 Multiple sclerosis: Secondary | ICD-10-CM

## 2016-09-07 DIAGNOSIS — R0602 Shortness of breath: Secondary | ICD-10-CM

## 2016-09-07 DIAGNOSIS — R1310 Dysphagia, unspecified: Secondary | ICD-10-CM | POA: Insufficient documentation

## 2016-09-07 NOTE — Patient Instructions (Addendum)
Get vitamin D3 over the counter and take 5000 iu daily - this may help some pain and fatigue   Let's get you set up with pulmonary in [place]  Stay away from cigarettes- I expect further improvement in your breathing and energy level and sleep   Now that you are moving more- you may be able to loose weight   Get zyrtec over the counter 10 mg and take one daily to help with post nasal drip  We will refer you to ENT to evaluate swallowing/drainage and the swollen feeling under jaw   You can schedule your own eye/vision appt when you are ready   Xray of hands today

## 2016-09-07 NOTE — Progress Notes (Signed)
Subjective:    Patient ID: Karina [name], female    DOB: 03-26-1955, 61 y.o.   MRN: [id number]  HPI Here today for multiple physical c/o incl GI/ breathing and fatigue as well as pain in joints   Wt Readings from Last 3 Encounters:  09/07/16 186 lb 4 oz (84.5 kg)  09/01/16 185 lb 12 oz (84.3 kg)  09/01/16 185 lb 12 oz (84.3 kg)   bmi 35.1 Only eats one time per day- but gaining wt  Eats crackers/pb -then very little for dinner Lab Results  Component Value Date   TSH 1.53 09/01/2016     She is generally tired and feels "inflammation"  Has to take naps -no energy  Sleeping better at night since she quit smoking (is able to go to sleep earlier)  Now she is dreaming also  Hurts all over -hands especially (knuckles are swollen -worse in the right hand) - she is R handed   C/o easy sob/ doing anything (or going outside) Coughs up sputum Has not smoked in 6 days!   - after seeing several friends with cancer  She is using patch and gum  She would like to get set up with Dr Vella Kohler for pulmonary or new appt in [employer] makes her cough  Albuterol uses in am   Constant sinus drainage makes her choke - phlegm is foamy to clear (feels it in her throat) ? If from hardware in her neck or something else  Hard to swallow at times  She feels ? Swollen area under jaw also   Stomach swells though she eats very little  Also cramping in stomach and intestines for the past year  Takes omeprazole 40 mg bid -no heartburn but still has pain  Hx of gerd/gastritis and ibs  Colonoscopy 2015  She thinks she had EGD years ago (unsure if she can get one with her neck surgery)    Vision is not as good   Tends to have dry patchy skin with sores    Had labs on 6/27 for upcoming physical Results for orders placed or performed in visit on 09/01/16  CBC with Differential/Platelet  Result Value Ref Range   WBC 8.9 4.0 - 10.5 K/uL   RBC 4.55 3.87 - 5.11 Mil/uL   Hemoglobin 14.7  12.0 - 15.0 g/dL   HCT 43.2 36.0 - 46.0 %   MCV 95.0 78.0 - 100.0 fl   MCHC 34.0 30.0 - 36.0 g/dL   RDW 13.8 11.5 - 15.5 %   Platelets 222.0 150.0 - 400.0 K/uL   Neutrophils Relative % 57.7 43.0 - 77.0 %   Lymphocytes Relative 33.7 12.0 - 46.0 %   Monocytes Relative 6.2 3.0 - 12.0 %   Eosinophils Relative 1.2 0.0 - 5.0 %   Basophils Relative 1.2 0.0 - 3.0 %   Neutro Abs 5.1 1.4 - 7.7 K/uL   Lymphs Abs 3.0 0.7 - 4.0 K/uL   Monocytes Absolute 0.6 0.1 - 1.0 K/uL   Eosinophils Absolute 0.1 0.0 - 0.7 K/uL   Basophils Absolute 0.1 0.0 - 0.1 K/uL  Comprehensive metabolic panel  Result Value Ref Range   Sodium 139 135 - 145 mEq/L   Potassium 4.3 3.5 - 5.1 mEq/L   Chloride 102 96 - 112 mEq/L   CO2 33 (H) 19 - 32 mEq/L   Glucose, Bld 100 (H) 70 - 99 mg/dL   BUN 7 6 - 23 mg/dL   Creatinine, Ser 0.98 0.40 - 1.20  mg/dL   Total Bilirubin 0.4 0.2 - 1.2 mg/dL   Alkaline Phosphatase 70 39 - 117 U/L   AST 17 0 - 37 U/L   ALT 13 0 - 35 U/L   Total Protein 7.2 6.0 - 8.3 g/dL   Albumin 4.2 3.5 - 5.2 g/dL   Calcium 10.3 8.4 - 10.5 mg/dL   GFR 61.82 >60.00 mL/min  Hemoglobin A1c  Result Value Ref Range   Hgb A1c MFr Bld 5.9 4.6 - 6.5 %  Lipid panel  Result Value Ref Range   Cholesterol 266 (H) 0 - 200 mg/dL   Triglycerides 130.0 0.0 - 149.0 mg/dL   HDL 62.20 >39.00 mg/dL   VLDL 26.0 0.0 - 40.0 mg/dL   LDL Cholesterol 178 (H) 0 - 99 mg/dL   Total CHOL/HDL Ratio 4    NonHDL 203.52   TSH  Result Value Ref Range   TSH 1.53 0.35 - 4.50 uIU/mL  VITAMIN D 25 Hydroxy (Vit-D Deficiency, Fractures)  Result Value Ref Range   VITD 31.41 30.00 - 100.00 ng/mL    Patient Active Problem List   Diagnosis Date Noted  . Fatigue 09/07/2016  . Dysphagia 09/07/2016  . Bilateral hand pain 09/07/2016  . Rash and nonspecific skin eruption 05/18/2016  . Cough 05/18/2016  . Abdominal pain, diffuse 05/18/2016  . Breast cyst, left 04/30/2016  . Breast pain, right 04/30/2016  . Routine general medical  examination at a health care facility 05/06/2015  . Large breasts 05/06/2015  . Lumbar disc disease 11/01/2014  . Knee pain, bilateral 03/11/2014  . Thoracic back pain 11/30/2013  . Encounter for routine gynecological examination 01/31/2013  . Encounter for Medicare annual wellness exam 01/23/2013  . Chest pain 09/20/2012  . SOB (shortness of breath) 09/20/2012  . Dyspnea 09/13/2012  . Asthmatic bronchitis , chronic ([place]) 12/06/2011  . Chronic cough 11/22/2011  . Anxiety disorder 07/17/2009  . HEMORRHOIDS-EXTERNAL 11/07/2008  . IRRITABLE BOWEL SYNDROME 11/07/2008  . LUNG NODULE 10/14/2008  . HYPERGLYCEMIA, BORDERLINE 10/14/2008  . PERSONAL HX COLONIC POLYPS 10/08/2008  . Vitamin D deficiency 07/23/2008  . SYNCOPE, HX OF 06/01/2008  . FOOT SURGERY, HX OF 06/01/2008  . DILATION AND CURETTAGE, HX OF 06/01/2008  . OSTEOARTHRITIS, SHOULDER, RIGHT 03/22/2008  . ROTATOR CUFF SYNDROME, RIGHT 03/22/2008  . ARTHRALGIA 12/12/2007  . Allergic rhinitis 08/01/2007  . Former smoker 10/17/2006  . NEUROPATHY-PERIPHERAL 10/17/2006  . FOOT PAIN, BILATERAL 10/17/2006  . SYMPTOM, EDEMA 10/03/2006  . HYPOGLYCEMIA 09/30/2006  . HYPERCHOLESTEROLEMIA 09/30/2006  . MULTIPLE SCLEROSIS 09/30/2006  . GERD 09/30/2006  . FIBROCYSTIC BREAST DISEASE 09/30/2006  . SEIZURE DISORDER 09/30/2006  . URINARY INCONTINENCE 09/30/2006   Past Medical History:  Diagnosis Date  . Allergic rhinitis   . Allergy   . Anxiety    [place], no per pt  . Cataract   . Colon polyps 08/2008   Adenomatous  . COPD (chronic obstructive pulmonary disease) ([place])   . Depression    no per pt  . Diabetes mellitus   . Gastritis 08/2008   H. pylori  . GERD (gastroesophageal reflux disease)   . History of shingles    Left V1  . Hypercholesteremia   . Lung nodule   . Menopausal disorder   . Multiple sclerosis ([place])   . Neuromuscular disorder ([place])    Multiple sclerosis  . Plantar fasciitis   . Seizure  disorder ([place])    single seizure/hypoglycemia  . Seizures ([place])    1 seizure in 2001  due to blood sugar dropping to 38, none since  . Urinary incontinence   . Vertigo    Past Surgical History:  Procedure Laterality Date  . CERVICAL DISCECTOMY  2004   x 2, Anterior; Fusion C4-5, C5-6, C6-7, Dr. Kary Kos  . CHEST CT  11/2008   With small 4 mm nodule L lung base (rec re check in 1 year)  . CHEST CT  07/2009   Re check chest CT - lung nodule stable  . CHOLECYSTECTOMY    . COLONOSCOPY  08/2008   Polyps/ re check 5 yrs  . CT SINUS LTD W/O CM  02/2009   negative  . DILATION AND CURETTAGE OF UTERUS    . ESOPHAGOGASTRODUODENOSCOPY  08/2008   Erosive gastritis, h pylori (treated)  . FOOT SURGERY     left plantar fascial problem  . ROTATOR CUFF REPAIR     right  . TONSILLECTOMY    . TUBAL LIGATION    . UPPER GASTROINTESTINAL ENDOSCOPY     Social History  Substance Use Topics  . Smoking status: Former Smoker    Years: 30.00    Types: Cigarettes    Quit date: 09/01/2016  . Smokeless tobacco: Never Used  . Alcohol use No   Family History  Problem Relation Age of Onset  . Migraines Mother   . Heart attack Mother   . Coronary artery disease Mother   . Coronary artery disease Father        6 bypasses   . Stroke Father   . Diabetes Father   . Renal Disease Father   . Colon cancer Maternal Grandfather 2  . Coronary artery disease Cousin   . Coronary artery disease Paternal Grandmother   . Lung cancer Paternal Aunt   . Multiple sclerosis Neg Hx   . Esophageal cancer Neg Hx   . Rectal cancer Neg Hx   . Stomach cancer Neg Hx    Allergies  Allergen Reactions  . Percocet [Oxycodone-Acetaminophen] Shortness Of Breath    Felt like going to pass out and sweating  . Tecfidera [Dimethyl Fumarate] Shortness Of Breath, Palpitations, Rash and Cough  . Amitriptyline Hcl     REACTION: swelling and itching  . Atorvastatin     REACTION: elevated LFT's  . Betaseron [Interferon  Beta-1b]     Headache  . Clarithromycin     REACTION: reaction not known  . Duloxetine     REACTION: pain  . Levofloxacin     REACTION: Rash  . Pregabalin     REACTION: LE swelling  . Pseudoephedrine     REACTION: legs hurt  . Zoloft [Sertraline Hcl] Other (See Comments)    Headaches    Current Outpatient Prescriptions on File Prior to Visit  Medication Sig Dispense Refill  . albuterol (VENTOLIN HFA) 108 (90 BASE) MCG/ACT inhaler INHALE 2 PUFFS UP TO EVERY 4 HOURS AS NEEDED FOR WHEEZING 1 Inhaler 11  . ALPRAZolam (XANAX) 0.5 MG tablet Take 1 tablet (0.5 mg total) by mouth 2 (two) times daily as needed. For anxiety. 30 tablet 0  . azelastine (ASTELIN) 0.1 % nasal spray Place 2 sprays into both nostrils 2 (two) times daily as needed.  12  . Blood Glucose Monitoring Suppl (ACCU-CHEK AVIVA PLUS) w/Device KIT Check blood sugar once daily and as directed Dx R73.09 1 kit 0  . budesonide-formoterol (SYMBICORT) 160-4.5 MCG/ACT inhaler Inhale 2 puffs into the lungs 2 (two) times daily. 3 Inhaler 2  . CRESTOR 20 MG tablet  Take 1 tablet (20 mg total) by mouth daily. (Patient taking differently: Take 10 mg by mouth daily. ) 90 tablet 3  . Diclofenac Sodium 3 % GEL Place 1 application onto the skin every 12 (twelve) hours as needed. 50 g 0  . gabapentin (NEURONTIN) 300 MG capsule TAKE 2 CAPSULES AT BEDTIME 180 capsule 3  . omeprazole (PRILOSEC) 40 MG capsule Take 1 capsule (40 mg total) by mouth 2 (two) times daily. 180 capsule 3  . Probiotic Product (ALIGN PO) Take by mouth daily.     No current facility-administered medications on file prior to visit.      Review of Systems    Review of Systems  Constitutional: Negative for fever, appetite change, and unexpected weight change. pos for fatigue Eyes: Negative for pain and visual disturbance.  Respiratory: pos for cough and shortness of breath that has improved  Cardiovascular: Negative for cp or palpitations    Gastrointestinal: Negative for  nausea, diarrhea and constipation. pos for abd bloating, cramping and pain , neg for blood in stool or dark stool Genitourinary: Negative for urgency and frequency. neg for dysuria or hematuria  Skin: Negative for pallor or rash  pos for dry skin  MSK pos for joint stiffness and pain  Neurological: Negative for , light-headedness, numbness and pos for headaches. pos for baseline MS symptoms  Hematological: Negative for adenopathy. Does not bruise/bleed easily.  Psychiatric/Behavioral: pos for anxious and sometimes depressed mood intermittently with frustration over health problems .      Objective:   Physical Exam  Constitutional: She appears well-developed and well-nourished. No distress.  obese and well appearing   HENT:  Head: Normocephalic and atraumatic.  Right Ear: External ear normal.  Left Ear: External ear normal.  Mouth/Throat: Oropharynx is clear and moist.  Nares are boggy Clear pnd noted  Throat is clear  Eyes: Conjunctivae and EOM are normal. Pupils are equal, round, and reactive to light. Right eye exhibits no discharge. Left eye exhibits no discharge. No scleral icterus.  Neck: Neck supple. No JVD present. Carotid bruit is not present. No tracheal deviation present. No thyromegaly present.  Full area pt palpated under mandible feels like salivary gland - ? If more prominent on the R than the L  Cardiovascular: Normal rate, regular rhythm, normal heart sounds and intact distal pulses.  Exam reveals no gallop.   Pulmonary/Chest: Effort normal and breath sounds normal. No respiratory distress. She has no wheezes. She has no rales.  No crackles  Diffusely distant bs Scant wheeze on forced exp only No prolonged exp time   Abdominal: Soft. Bowel sounds are normal. She exhibits no distension, no abdominal bruit and no mass. There is no tenderness.  Musculoskeletal: She exhibits no edema.  Some tenderness of phalanges and mcp joints  with nl rom of all digits  No deformity  or crepitus  Limited rom of neck due to pain  Lymphadenopathy:    She has no cervical adenopathy.  Neurological: She is alert. She has normal reflexes. No cranial nerve deficit. She exhibits normal muscle tone. Coordination normal.  Skin: Skin is warm and dry. No rash noted. No pallor.  Psychiatric: She has a normal mood and affect.          Assessment & Plan:   Problem List Items Addressed This Visit      Respiratory   Asthmatic bronchitis , chronic ([place])    Pt wishes to change pulmonary clinic to [facility] - will do that  Digestive   Dysphagia    Pt c/o of sinus drainage and occ problems swallowing food and liquids -at the level of her throat  She thinks this may have to do with hardware from her 2nd neck surgery/but not sure , as well as allergies  No heartburn or regurgitation Ref to ENT for further eval       Relevant Orders   Ambulatory referral to ENT     Nervous and Auditory   MULTIPLE SCLEROSIS    Per pt fairly stable F/u with neuro is soon        Other   Abdominal pain, diffuse    Pt c/o mainly of cramping/ some epigastric pain but no heartburn  Hx of EGD showing gastritis On bid ppi  Will likely need to return to GI for this  Disc keeping a food diary to see if certain things make her symptoms worsen      Bilateral hand pain    With stiffness of MCP and proximal phalanges primarily  Thinks she has some swelling/ no deformity however Xray today and plan to follow        Relevant Orders   DG HANDS 1 VIEW BILAT BALLCATCHERS (Completed)   Cough    Pt thinks symbicort made this worse so she stopped it       Fatigue    Suspect multifactorial  This has improved since quitting smoking with better sleep and breathing  Reviewed labs  Will inc her vit D supplementation  Health mt exam upcoming       Former smoker    Commended on quitting 6 days ago and pt thinks she can stay smoke free  Already has better sleep and exercise tolerance  and less wheezing       SOB (shortness of breath)    Hx of asthmatic bronchitis and smoking She quit 6 days ago and has already noticed better exercise tolerance and less wheezing Not using symbicort because it causes cough Albuterol - once daily  She would like to re establish with a pulmonary doctor in [place] Ref made      Relevant Orders   Ambulatory referral to Pulmonology   Vitamin D deficiency - Primary    D level in the 24s  May add to [place] daily dose to 5000 iu daily

## 2016-09-08 NOTE — Assessment & Plan Note (Signed)
No further seizures

## 2016-09-08 NOTE — Assessment & Plan Note (Signed)
Pt c/o mainly of cramping/ some epigastric pain but no heartburn  Hx of EGD showing gastritis On bid ppi  Will likely need to return to GI for this  Disc keeping a food diary to see if certain things make her symptoms worsen

## 2016-09-08 NOTE — Assessment & Plan Note (Signed)
D level in the 30s  May add to [place] daily dose to 5000 iu daily

## 2016-09-08 NOTE — Assessment & Plan Note (Signed)
Pt thinks symbicort made this worse so she stopped it

## 2016-09-08 NOTE — Assessment & Plan Note (Signed)
Suspect multifactorial  This has improved since quitting smoking with better sleep and breathing  Reviewed labs  Will inc her vit D supplementation  Health mt exam upcoming

## 2016-09-08 NOTE — Assessment & Plan Note (Signed)
Hx of asthmatic bronchitis and smoking She quit 6 days ago and has already noticed better exercise tolerance and less wheezing Not using symbicort because it causes cough Albuterol - once daily  She would like to re establish with a pulmonary doctor in [place] Ref made

## 2016-09-08 NOTE — Assessment & Plan Note (Signed)
Commended on quitting 6 days ago and pt thinks she can stay smoke free  Already has better sleep and exercise tolerance and less wheezing

## 2016-09-08 NOTE — Assessment & Plan Note (Signed)
With stiffness of MCP and proximal phalanges primarily  Thinks she has some swelling/ no deformity however Xray today and plan to follow

## 2016-09-08 NOTE — Assessment & Plan Note (Signed)
Pt c/o of sinus drainage and occ problems swallowing food and liquids -at the level of her throat  She thinks this may have to do with hardware from her 2nd neck surgery/but not sure , as well as allergies  No heartburn or regurgitation Ref to ENT for further eval

## 2016-09-08 NOTE — Assessment & Plan Note (Signed)
Per pt fairly stable F/u with neuro is soon

## 2016-09-08 NOTE — Assessment & Plan Note (Signed)
Pt wishes to change pulmonary clinic to [facility] - will do that

## 2016-09-09 ENCOUNTER — Encounter: Payer: Self-pay | Admitting: Family Medicine

## 2016-09-09 DIAGNOSIS — M154 Erosive (osteo)arthritis: Secondary | ICD-10-CM | POA: Insufficient documentation

## 2016-09-10 ENCOUNTER — Ambulatory Visit (INDEPENDENT_AMBULATORY_CARE_PROVIDER_SITE_OTHER): Payer: Medicare HMO | Admitting: Family Medicine

## 2016-09-10 ENCOUNTER — Encounter: Payer: Self-pay | Admitting: Family Medicine

## 2016-09-10 VITALS — BP 122/84 | HR 76 | Temp 98.7°F | Ht 61.0 in | Wt 187.8 lb

## 2016-09-10 DIAGNOSIS — Z87891 Personal history of nicotine dependence: Secondary | ICD-10-CM

## 2016-09-10 DIAGNOSIS — G609 Hereditary and idiopathic neuropathy, unspecified: Secondary | ICD-10-CM

## 2016-09-10 DIAGNOSIS — M79642 Pain in left hand: Secondary | ICD-10-CM | POA: Diagnosis not present

## 2016-09-10 DIAGNOSIS — M154 Erosive (osteo)arthritis: Secondary | ICD-10-CM

## 2016-09-10 DIAGNOSIS — M79641 Pain in right hand: Secondary | ICD-10-CM | POA: Diagnosis not present

## 2016-09-10 DIAGNOSIS — E559 Vitamin D deficiency, unspecified: Secondary | ICD-10-CM

## 2016-09-10 DIAGNOSIS — G44201 Tension-type headache, unspecified, intractable: Secondary | ICD-10-CM | POA: Diagnosis not present

## 2016-09-10 DIAGNOSIS — Z0000 Encounter for general adult medical examination without abnormal findings: Secondary | ICD-10-CM

## 2016-09-10 DIAGNOSIS — R1313 Dysphagia, pharyngeal phase: Secondary | ICD-10-CM | POA: Diagnosis not present

## 2016-09-10 DIAGNOSIS — E7800 Pure hypercholesterolemia, unspecified: Secondary | ICD-10-CM | POA: Diagnosis not present

## 2016-09-10 LAB — URIC ACID: Uric Acid, Serum: 4.1 mg/dL (ref 2.4–7.0)

## 2016-09-10 LAB — SEDIMENTATION RATE: Sed Rate: 4 mm/hr (ref 0–30)

## 2016-09-10 MED ORDER — ALBUTEROL SULFATE HFA 108 (90 BASE) MCG/ACT IN AERS: INHALATION_SPRAY | RESPIRATORY_TRACT | 3 refills | Status: DC

## 2016-09-10 NOTE — Assessment & Plan Note (Signed)
Pt increased dose of D3 to 5000 iu daily  Continue to follow Disc imp to bone and overall health

## 2016-09-10 NOTE — Assessment & Plan Note (Signed)
Seen on recent xray with moderate pain - occ keeping her up at night Rheumatoid labs/esr and uric acid today  Plan from there 

## 2016-09-10 NOTE — Assessment & Plan Note (Signed)
Erosive changes on xray (joint) Will check rheum labs today as well as uric acid

## 2016-09-10 NOTE — Progress Notes (Signed)
Subjective:    Patient ID: Karina [name], female    DOB: 08-21-1955, 61 y.o.   MRN: [id number]  HPI Here for health maintenance exam and to review chronic medical problems    Had AMW visit this mo  Missed low tones in both ears   Wt Readings from Last 3 Encounters:  09/10/16 187 lb 12 oz (85.2 kg)  09/07/16 186 lb 4 oz (84.5 kg)  09/01/16 185 lb 12 oz (84.3 kg)   bmi 35.4  Was just seen for multiple health issues  C/o hand pain and xrays showed some erosive changes Also skin changes and sores occ  Feels inflamed all over    Dg Hands 1 View Bilat [place]  Result Date: 09/07/2016 CLINICAL DATA:  Pain hands.  Swollen joints. EXAM: HAND LIMITED 1 VIEW COMPARISON:  No recent prior. FINDINGS: No acute bony or joint abnormalities identified. No evidence of fracture or dislocation. Mild degenerative changes radiocarpal joints. Very subtle periarticular erosions at the base of the left fourth proximal phalanx and base of the right third and fourth proximal phalanges. Inflammatory arthropathy including rheumatoid arthritis cannot be completely excluded. Small calcific densities noted about the left wrist, most likely dystrophic calcifications. IMPRESSION: 1.  Mild bilateral radiocarpal degenerative change. 2. Mild periarticular erosions at the base of the proximal phalanx of the left fourth digit and of the proximal phalanges of the right third and fourth digits cannot be completely excluded. Inflammatory arthropathy including rheumatoid arthritis cannot be completely excluded. Electronically Signed   By: Marcello Moores  Register   On: 09/07/2016 15:38   Needs labs for autoimmune joint changes today  Flu shot- did not get in the fall (made her sick previously)   Pap 2/17 nl with gyn  No bleeding or symptoms   Mammogram 3/18 negative (she has fibrocystic change/ occ pain)  Self breast exam-no lumps   Colonoscopy 8/15 adenomatous polyp Has IBS and chronic abd pain   Tetanus shot  11/14  Hep C and HIV screen neg in 2/17  Vit D def 31.4 Told to inc her D to 5000 iu daily- she started that   Former smoker-quit recently --doing ok so far/not a lot of cravings Eating suckers instead  Starting to cough up some stuff   PNA vaccine 5/05-she wants to wait to get another one / it made her arm hurt   Recently ref to ENT for dysphagia and pnd (throat c/o) -hx of neck hardware from surgery appt is today   Continues to see neuro for her MS (doing pretty well with that)  She was supposed to have MRI but breathing was too bad/will re schedule   Pulmonary for copd/asthmatic bronchitis with smoking hx -appt 8/2  Hx of blood sugar instability This check 100 fasting   Hyperlipidemia  Lab Results  Component Value Date   CHOL 266 (H) 09/01/2016   CHOL 265 (H) 04/30/2015   CHOL 276 (H) 01/24/2013   Lab Results  Component Value Date   HDL 62.20 09/01/2016   HDL 69.20 04/30/2015   HDL 57.90 01/24/2013   Lab Results  Component Value Date   LDLCALC 178 (H) 09/01/2016   LDLCALC 176 (H) 04/30/2015   LDLCALC 77 11/19/2011   Lab Results  Component Value Date   TRIG 130.0 09/01/2016   TRIG 98.0 04/30/2015   TRIG 107.0 01/24/2013   Lab Results  Component Value Date   CHOLHDL 4 09/01/2016   CHOLHDL 4 04/30/2015   CHOLHDL 5 01/24/2013  Lab Results  Component Value Date   LDLDIRECT 206.5 01/24/2013   LDLDIRECT 176.9 04/20/2010   On crestor and diet  Just started back on crestor (was forgetting to take it at night)  Does not eat much meat  occ fried food/ not as much as she used to  No southern breakfast  Some cheese    Lab Results  Component Value Date   WBC 8.9 09/01/2016   HGB 14.7 09/01/2016   HCT 43.2 09/01/2016   MCV 95.0 09/01/2016   PLT 222.0 09/01/2016      Chemistry      Component Value Date/Time   NA 139 09/01/2016 1235   NA 141 11/21/2014 1137   NA 136 07/09/2013 1803   K 4.3 09/01/2016 1235   K 4.2 07/09/2013 1803   CL 102  09/01/2016 1235   CL 105 07/09/2013 1803   CO2 33 (H) 09/01/2016 1235   CO2 22 07/09/2013 1803   BUN 7 09/01/2016 1235   BUN 11 11/21/2014 1137   BUN 16 07/09/2013 1803   CREATININE 0.98 09/01/2016 1235   CREATININE 1.13 07/09/2013 1803      Component Value Date/Time   CALCIUM 10.3 09/01/2016 1235   CALCIUM 9.6 07/09/2013 1803   ALKPHOS 70 09/01/2016 1235   AST 17 09/01/2016 1235   ALT 13 09/01/2016 1235   BILITOT 0.4 09/01/2016 1235   BILITOT 0.3 11/21/2014 1137      She is eating candy while quitting smoking   Patient Active Problem List   Diagnosis Date Noted  . Erosive osteoarthritis of hands, bilateral 09/09/2016  . Fatigue 09/07/2016  . Dysphagia 09/07/2016  . Bilateral hand pain 09/07/2016  . Rash and nonspecific skin eruption 05/18/2016  . Cough 05/18/2016  . Abdominal pain, diffuse 05/18/2016  . Breast cyst, left 04/30/2016  . Breast pain, right 04/30/2016  . Routine general medical examination at a health care facility 05/06/2015  . Large breasts 05/06/2015  . Lumbar disc disease 11/01/2014  . Knee pain, bilateral 03/11/2014  . Thoracic back pain 11/30/2013  . Encounter for routine gynecological examination 01/31/2013  . Encounter for Medicare annual wellness exam 01/23/2013  . Chest pain 09/20/2012  . SOB (shortness of breath) 09/20/2012  . Dyspnea 09/13/2012  . Asthmatic bronchitis , chronic ([place]) 12/06/2011  . Chronic cough 11/22/2011  . Anxiety disorder 07/17/2009  . HEMORRHOIDS-EXTERNAL 11/07/2008  . IRRITABLE BOWEL SYNDROME 11/07/2008  . LUNG NODULE 10/14/2008  . HYPERGLYCEMIA, BORDERLINE 10/14/2008  . PERSONAL HX COLONIC POLYPS 10/08/2008  . Vitamin D deficiency 07/23/2008  . SYNCOPE, HX OF 06/01/2008  . FOOT SURGERY, HX OF 06/01/2008  . DILATION AND CURETTAGE, HX OF 06/01/2008  . OSTEOARTHRITIS, SHOULDER, RIGHT 03/22/2008  . ROTATOR CUFF SYNDROME, RIGHT 03/22/2008  . ARTHRALGIA 12/12/2007  . Allergic rhinitis 08/01/2007  . Former smoker  10/17/2006  . Peripheral neuropathy 10/17/2006  . FOOT PAIN, BILATERAL 10/17/2006  . SYMPTOM, EDEMA 10/03/2006  . HYPOGLYCEMIA 09/30/2006  . HYPERCHOLESTEROLEMIA 09/30/2006  . MULTIPLE SCLEROSIS 09/30/2006  . GERD 09/30/2006  . FIBROCYSTIC BREAST DISEASE 09/30/2006  . SEIZURE DISORDER 09/30/2006  . URINARY INCONTINENCE 09/30/2006   Past Medical History:  Diagnosis Date  . Allergic rhinitis   . Allergy   . Anxiety    [place] [place], no per pt  . Cataract   . Colon polyps 08/2008   Adenomatous  . COPD (chronic obstructive pulmonary disease) ([place])   . Depression    no per pt  . Diabetes mellitus   .  Gastritis 08/2008   H. pylori  . GERD (gastroesophageal reflux disease)   . History of shingles    Left V1  . Hypercholesteremia   . Lung nodule   . Menopausal disorder   . Multiple sclerosis ([place])   . Neuromuscular disorder ([place])    Multiple sclerosis  . Plantar fasciitis   . Seizure disorder ([place])    single seizure/hypoglycemia  . Seizures ([place])    1 seizure in 2001 due to blood sugar dropping to 38, none since  . Urinary incontinence   . Vertigo    Past Surgical History:  Procedure Laterality Date  . CERVICAL DISCECTOMY  2004   x 2, Anterior; Fusion C4-5, C5-6, C6-7, Dr. Kary Kos  . CHEST CT  11/2008   With small 4 mm nodule L lung base (rec re check in 1 year)  . CHEST CT  07/2009   Re check chest CT - lung nodule stable  . CHOLECYSTECTOMY    . COLONOSCOPY  08/2008   Polyps/ re check 5 yrs  . CT SINUS LTD W/O CM  02/2009   negative  . DILATION AND CURETTAGE OF UTERUS    . ESOPHAGOGASTRODUODENOSCOPY  08/2008   Erosive gastritis, h pylori (treated)  . FOOT SURGERY     left plantar fascial problem  . ROTATOR CUFF REPAIR     right  . TONSILLECTOMY    . TUBAL LIGATION    . UPPER GASTROINTESTINAL ENDOSCOPY     Social History  Substance Use Topics  . Smoking status: Former Smoker    Years: 30.00    Types: Cigarettes    Quit date: 09/01/2016  .  Smokeless tobacco: Never Used  . Alcohol use No   Family History  Problem Relation Age of Onset  . Migraines Mother   . Heart attack Mother   . Coronary artery disease Mother   . Coronary artery disease Father        6 bypasses   . Stroke Father   . Diabetes Father   . Renal Disease Father   . Colon cancer Maternal Grandfather 67  . Coronary artery disease Cousin   . Coronary artery disease Paternal Grandmother   . Lung cancer Paternal Aunt   . Multiple sclerosis Neg Hx   . Esophageal cancer Neg Hx   . Rectal cancer Neg Hx   . Stomach cancer Neg Hx    Allergies  Allergen Reactions  . Percocet [Oxycodone-Acetaminophen] Shortness Of Breath    Felt like going to pass out and sweating  . Tecfidera [Dimethyl Fumarate] Shortness Of Breath, Palpitations, Rash and Cough  . Amitriptyline Hcl     REACTION: swelling and itching  . Atorvastatin     REACTION: elevated LFT's  . Betaseron [Interferon Beta-1b]     Headache  . Clarithromycin     REACTION: reaction not known  . Duloxetine     REACTION: pain  . Levofloxacin     REACTION: Rash  . Pregabalin     REACTION: LE swelling  . Pseudoephedrine     REACTION: legs hurt  . Zoloft [Sertraline Hcl] Other (See Comments)    Headaches    Current Outpatient Prescriptions on File Prior to Visit  Medication Sig Dispense Refill  . ALPRAZolam (XANAX) 0.5 MG tablet Take 1 tablet (0.5 mg total) by mouth 2 (two) times daily as needed. For anxiety. 30 tablet 0  . azelastine (ASTELIN) 0.1 % nasal spray Place 2 sprays into both nostrils 2 (two) times daily  as needed.  12  . Blood Glucose Monitoring Suppl (ACCU-CHEK AVIVA PLUS) w/Device KIT Check blood sugar once daily and as directed Dx R73.09 1 kit 0  . budesonide-formoterol (SYMBICORT) 160-4.5 MCG/ACT inhaler Inhale 2 puffs into the lungs 2 (two) times daily. 3 Inhaler 2  . CRESTOR 20 MG tablet Take 1 tablet (20 mg total) by mouth daily. (Patient taking differently: Take 10 mg by mouth daily.  ) 90 tablet 3  . Diclofenac Sodium 3 % GEL Place 1 application onto the skin every 12 (twelve) hours as needed. 50 g 0  . gabapentin (NEURONTIN) 300 MG capsule TAKE 2 CAPSULES AT BEDTIME 180 capsule 3  . omeprazole (PRILOSEC) 40 MG capsule Take 1 capsule (40 mg total) by mouth 2 (two) times daily. 180 capsule 3  . Probiotic Product (ALIGN PO) Take by mouth daily.     No current facility-administered medications on file prior to visit.     Review of Systems    Review of Systems  Constitutional: Negative for fever, appetite change,  and unexpected weight change.  Eyes: Negative for pain and visual disturbance.  Respiratory: Negative for cough and pos for baseline shortness of breath.  (improved since smoking cessation) Cardiovascular: Negative for cp or palpitations    Gastrointestinal: Negative for nausea, diarrhea and constipation. pos for intermittent diffuse abd pain and cramping  Genitourinary: Negative for urgency and frequency. neg for dysuria Skin: Negative for pallor or rash  pos for dry skin with occ "sores" Neurological: Negative for weakness, light-headedness, numbness and headaches. pos for MS and symptoms of neuropathy in feet  Hematological: Negative for adenopathy. Does not bruise/bleed easily.  Psychiatric/Behavioral: Negative for dysphoric mood. The patient is nervous/anxious.      Objective:   Physical Exam  Constitutional: She appears well-developed and well-nourished. No distress.  obese and well appearing   HENT:  Head: Normocephalic and atraumatic.  Right Ear: External ear normal.  Left Ear: External ear normal.  Mouth/Throat: Oropharynx is clear and moist.  Eyes: Conjunctivae and EOM are normal. Pupils are equal, round, and reactive to light. No scleral icterus.  Neck: Normal range of motion. Neck supple. No JVD present. Carotid bruit is not present. No thyromegaly present.  Cardiovascular: Normal rate, regular rhythm, normal heart sounds and intact distal  pulses.  Exam reveals no gallop.   Pulmonary/Chest: Effort normal and breath sounds normal. No respiratory distress. She has no wheezes. She exhibits no tenderness.  Abdominal: Soft. Bowel sounds are normal. She exhibits no distension, no abdominal bruit and no mass. There is no tenderness.  Genitourinary: No breast swelling, tenderness, discharge or bleeding.  Genitourinary Comments: Breast exam: No mass, nodules, thickening, tenderness, bulging, retraction, inflamation, nipple discharge or skin changes noted.  No axillary or clavicular LA.      Musculoskeletal: She exhibits tenderness. She exhibits no edema.  Tender MCP joints and phalanges w/o deformity  Lymphadenopathy:    She has no cervical adenopathy.  Neurological: She is alert. She has normal reflexes. No cranial nerve deficit. She exhibits normal muscle tone. Coordination normal.  No tremor  Skin: Skin is warm and dry. No rash noted. No erythema. No pallor.  Mildly dry skin  No lesions today  Psychiatric: She has a normal mood and affect.          Assessment & Plan:   Problem List Items Addressed This Visit      Nervous and Auditory   Peripheral neuropathy    Worse sens of feet burning  lately - she will d/w neuro at upcoming f/u        Musculoskeletal and Integument   Erosive osteoarthritis of hands, bilateral - Primary    Seen on recent xray with moderate pain - occ keeping her up at night Rheumatoid labs/esr and uric acid today  Plan from there      Relevant Orders   ANA   Rheumatoid factor   Sedimentation rate   Uric acid     Other   Bilateral hand pain    Erosive changes on xray (joint) Will check rheum labs today as well as uric acid       Former smoker    Commended on quitting- doing fairly well       HYPERCHOLESTEROLEMIA    Non compliant with crestor-stressed imp with getting back on track  If needed-will dose in am  Disc goals for lipids and reasons to control them Rev labs with pt Rev low  sat fat diet in detail LDL is up as expected Re check in 6 wk on crestor      Routine general medical examination at a health care facility    Reviewed health habits including diet and exercise and skin cancer prevention Reviewed appropriate screening tests for age  Also reviewed health mt list, fam hx and immunization status , as well as social and family history   amw reviewed  Labs reviewed See HPI Lab for autoimmune joint dz today  Pt will get back on crestor for cholesterol and re check 6 wk Commended on smoking cessation Declines flu shots  Declines pna vaccine today  For ent and pulm f/u  Will disc GI symptoms in more detail after this  Enc diet low in fat and sugar with exercise as tolerated       Vitamin D deficiency    Pt increased dose of D3 to 5000 iu daily  Continue to follow Disc imp to bone and overall health

## 2016-09-10 NOTE — Assessment & Plan Note (Signed)
Reviewed health habits including diet and exercise and skin cancer prevention Reviewed appropriate screening tests for age  Also reviewed health mt list, fam hx and immunization status , as well as social and family history   amw reviewed  Labs reviewed See HPI Lab for autoimmune joint dz today  Pt will get back on crestor for cholesterol and re check 6 wk Commended on smoking cessation Declines flu shots  Declines pna vaccine today  For ent and pulm f/u  Will disc GI symptoms in more detail after this  Enc diet low in fat and sugar with exercise as tolerated

## 2016-09-10 NOTE — Patient Instructions (Addendum)
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

## 2016-09-10 NOTE — Assessment & Plan Note (Signed)
Worse sens of feet burning lately - she will d/w neuro at upcoming f/u

## 2016-09-10 NOTE — Assessment & Plan Note (Signed)
Non compliant with crestor-stressed imp with getting back on track  If needed-will dose in am  Disc goals for lipids and reasons to control them Rev labs with pt Rev low sat fat diet in detail LDL is up as expected Re check in 6 wk on crestor

## 2016-09-10 NOTE — Assessment & Plan Note (Signed)
Commended on quitting- doing fairly well

## 2016-09-13 LAB — RHEUMATOID FACTOR: Rhuematoid fact SerPl-aCnc: <14 IU/mL (ref <14)

## 2016-09-13 LAB — ANA: Anti Nuclear Antibody(ANA): NEGATIVE

## 2016-09-14 ENCOUNTER — Telehealth: Payer: Self-pay | Admitting: Family Medicine

## 2016-09-14 DIAGNOSIS — M154 Erosive (osteo)arthritis: Secondary | ICD-10-CM

## 2016-09-14 NOTE — Telephone Encounter (Signed)
Done and will route to [facility] 

## 2016-09-14 NOTE — Telephone Encounter (Signed)
-----   Message from Modena Nunnery, [place] sent at 09/14/2016  8:50 AM EDT ----- Spoke to pt and informed her of results. Pt is agreeable to referral and advised to await a call with appt details

## 2016-09-21 ENCOUNTER — Ambulatory Visit (INDEPENDENT_AMBULATORY_CARE_PROVIDER_SITE_OTHER): Payer: Medicare HMO | Admitting: Adult Health

## 2016-09-21 ENCOUNTER — Encounter: Payer: Self-pay | Admitting: Adult Health

## 2016-09-21 VITALS — BP 120/72 | HR 68 | Wt 190.2 lb

## 2016-09-21 DIAGNOSIS — G35 Multiple sclerosis: Secondary | ICD-10-CM

## 2016-09-21 NOTE — Progress Notes (Signed)
I have read the note, and I agree with the clinical assessment and plan.  WILLIS,CHARLES KEITH   

## 2016-09-21 NOTE — Progress Notes (Signed)
PATIENT: Karina [name] DOB: 19-Dec-1955  REASON FOR VISIT: follow up- multiple sclerosis HISTORY FROM: patient  HISTORY OF PRESENT ILLNESS: Today 09/21/16 Karina [name] is a 61 year old female with a history of multiple sclerosis. She returns today for follow-up. She is not on any disease modifying agents. An MRI of the brain was ordered and and she tried to complete this however she states that she was having difficulty breathing laying flat and could not finish the scan. She states that since then she has quit smoking and has noticed a difference in her breathing. She denies any new numbness or weakness. She does state that the bottom of her feet burn but this has been going on for quite some time. She does state that she is borderline diabetic and lately her numbers have been fluctuating quite a bit. She denies any changes with her gait or balance. Denies any changes with the bowel or bladder. She states that she finds herself squinting to see. She has been to a optometrist who gave her a prescription for glasses however she did not have these filled but is rather using store-bought glasses. She returns today for an evaluation.  HISTORY 03/23/2016: Karina [name] is a 61 year old female with a history of multiple sclerosis. She returns today for follow-up. She is currently not on any disease modifying agents. At the last visit a MRI of the brain was ordered however the patient states that she did not have this completed due to cost. She states overall she's been doing well. She reports approximately 3 months ago she had numbness in the left hand. She states that this has improved. She still has some numbness in the last 2 digits however she reports that this is improving. She denies any changes with her gait or balance. Denies any new changes with the bowels or bladder. Continues to have incontinence with the bladder. She intermittently has blurry vision. Denies any falls. She returns today for an  evaluation.  HISTORY 10/09/15: Karina [name] is a 61 year old female with a history of multiple sclerosis. She returns today for follow-up. In the past she has been on Betaseron and Tecfidera however she's been unable to tolerate these medications. At the last visit Dr. Jannifer Franklin recommended Pierre Bali or Rogelia Rohrer however the patient has not been on medication since December. She reports that she feels like she has "inflammation" in the hands in stomach. She states when she bends over she feels as if her stomach gets "twisted." She states that she does have new numbness in the right fingers. This is intermittent and started approximately 1-2 months ago. She notices it the most when she does housework. She denies any significant changes with her gait or balance. She does state that she feels stiff if she gets up during the night. Reports that her vision is still "foggy/blurry and feels that she has to squint." Reports she has cataracts but they're not severe enough for surgery. Denies any falls. She reports intermittent bladder incontinence. Denies any changes with the bowels. Her last MRI was in October 2016 did not show any new changes. She returns today for an evaluation.  REVIEW OF SYSTEMS: Out of a complete 14 system review of symptoms, the patient complains only of the following symptoms, and all other reviewed systems are negative.  Rectal pain, restless leg, daytime sleepiness, joint pain, joint swelling, back pain, muscle grams, walking difficulty, neck pain, neck stiffness, wounds, tremors, agitation, bruise easily, environmental allergies, incontinence of bladder, swollen abdomen, abdominal pain,  heat intolerance, eye discharge, light sensitivity, blurred vision, fatigue, ear discharge  ALLERGIES: Allergies  Allergen Reactions  . Percocet [Oxycodone-Acetaminophen] Shortness Of Breath    Felt like going to pass out and sweating  . Tecfidera [Dimethyl Fumarate] Shortness Of Breath, Palpitations, Rash and  Cough  . Amitriptyline Hcl     REACTION: swelling and itching  . Atorvastatin     REACTION: elevated LFT's  . Betaseron [Interferon Beta-1b]     Headache  . Clarithromycin     REACTION: reaction not known  . Duloxetine     REACTION: pain  . Levofloxacin     REACTION: Rash  . Pregabalin     REACTION: LE swelling  . Pseudoephedrine     REACTION: legs hurt  . Zoloft [Sertraline Hcl] Other (See Comments)    Headaches     HOME MEDICATIONS: Outpatient Medications Prior to Visit  Medication Sig Dispense Refill  . albuterol (VENTOLIN HFA) 108 (90 Base) MCG/ACT inhaler INHALE 2 PUFFS UP TO EVERY 4 HOURS AS NEEDED FOR WHEEZING 3 Inhaler 3  . ALPRAZolam (XANAX) 0.5 MG tablet Take 1 tablet (0.5 mg total) by mouth 2 (two) times daily as needed. For anxiety. 30 tablet 0  . azelastine (ASTELIN) 0.1 % nasal spray Place 2 sprays into both nostrils 2 (two) times daily as needed.  12  . Blood Glucose Monitoring Suppl (ACCU-CHEK AVIVA PLUS) w/Device KIT Check blood sugar once daily and as directed Dx R73.09 1 kit 0  . budesonide-formoterol (SYMBICORT) 160-4.5 MCG/ACT inhaler Inhale 2 puffs into the lungs 2 (two) times daily. 3 Inhaler 2  . CRESTOR 20 MG tablet Take 1 tablet (20 mg total) by mouth daily. (Patient taking differently: Take 10 mg by mouth daily. ) 90 tablet 3  . Diclofenac Sodium 3 % GEL Place 1 application onto the skin every 12 (twelve) hours as needed. 50 g 0  . gabapentin (NEURONTIN) 300 MG capsule TAKE 2 CAPSULES AT BEDTIME 180 capsule 3  . omeprazole (PRILOSEC) 40 MG capsule Take 1 capsule (40 mg total) by mouth 2 (two) times daily. 180 capsule 3  . Probiotic Product (ALIGN PO) Take by mouth daily.     No facility-administered medications prior to visit.     PAST MEDICAL HISTORY: Past Medical History:  Diagnosis Date  . Allergic rhinitis   . Allergy   . Anxiety    [place]- Evalina Field, no per pt  . Cataract   . Colon polyps 08/2008   Adenomatous  . COPD (chronic  obstructive pulmonary disease) (HCC)   . Depression    no per pt  . Diabetes mellitus   . Gastritis 08/2008   H. pylori  . GERD (gastroesophageal reflux disease)   . History of shingles    Left V1  . Hypercholesteremia   . Lung nodule   . Menopausal disorder   . Multiple sclerosis (HCC)   . Neuromuscular disorder (HCC)    Multiple sclerosis  . Plantar fasciitis   . Seizure disorder (HCC)    single seizure/hypoglycemia  . Seizures (HCC)    1 seizure in 2001 due to blood sugar dropping to 38, none since  . Urinary incontinence   . Vertigo     PAST SURGICAL HISTORY: Past Surgical History:  Procedure Laterality Date  . CERVICAL DISCECTOMY  2004   x 2, Anterior; Fusion C4-5, C5-6, C6-7, Dr. Donalee Citrin  . CHEST CT  11/2008   With small 4 mm nodule L lung base (rec re check  in 1 year)  . CHEST CT  07/2009   Re check chest CT - lung nodule stable  . CHOLECYSTECTOMY    . COLONOSCOPY  08/2008   Polyps/ re check 5 yrs  . CT SINUS LTD W/O CM  02/2009   negative  . DILATION AND CURETTAGE OF UTERUS    . ESOPHAGOGASTRODUODENOSCOPY  08/2008   Erosive gastritis, h pylori (treated)  . FOOT SURGERY     left plantar fascial problem  . ROTATOR CUFF REPAIR     right  . TONSILLECTOMY    . TUBAL LIGATION    . UPPER GASTROINTESTINAL ENDOSCOPY      FAMILY HISTORY: Family History  Problem Relation Age of Onset  . Migraines Mother   . Heart attack Mother   . Coronary artery disease Mother   . Coronary artery disease Father        6 bypasses   . Stroke Father   . Diabetes Father   . Renal Disease Father   . Colon cancer Maternal Grandfather 54  . Coronary artery disease Cousin   . Coronary artery disease Paternal Grandmother   . Lung cancer Paternal Aunt   . Multiple sclerosis Neg Hx   . Esophageal cancer Neg Hx   . Rectal cancer Neg Hx   . Stomach cancer Neg Hx     SOCIAL HISTORY: Social History   Social History  . Marital status: Married    Spouse name: N/A  . Number  of children: 3  . Years of education: 9 th   Occupational History  . Disabled  Disabled   Social History Main Topics  . Smoking status: Former Smoker    Years: 30.00    Types: Cigarettes    Quit date: 09/01/2016  . Smokeless tobacco: Never Used  . Alcohol use No  . Drug use: No  . Sexual activity: Not on file   Other Topics Concern  . Not on file   Social History Narrative   Married with 3 children   Daily caffeine use - 2    Disabled.   Education 9th grade    Caffeine one cup of coffee daily.   Patient is right handed.       PHYSICAL EXAM  Vitals:   09/21/16 1050  BP: 120/72  Pulse: 68  Weight: 190 lb 3.2 oz (86.3 kg)   Body mass index is 35.94 kg/m.  Generalized: Well developed, in no acute distress   Neurological examination  Mentation: Alert oriented to time, place, history taking. Follows all commands speech and language fluent Cranial nerve II-XII: Pupils were equal round reactive to light. Extraocular movements were full, visual field were full on confrontational test. Facial sensation and strength were normal. Uvula tongue midline. Head turning and shoulder shrug  were normal and symmetric. Motor: The motor testing reveals 5 over 5 strength of all 4 extremities. Good symmetric motor tone is noted throughout.  Sensory: Sensory testing is intact to soft touch on all 4 extremities. No evidence of extinction is noted.  Coordination: Cerebellar testing reveals good finger-nose-finger and heel-to-shin bilaterally.  Gait and station: Gait is normal.  Reflexes: Deep tendon reflexes are symmetric and normal bilaterally.   DIAGNOSTIC DATA (LABS, IMAGING, TESTING) - I reviewed patient records, labs, notes, testing and imaging myself where available.  Lab Results  Component Value Date   WBC 8.9 09/01/2016   HGB 14.7 09/01/2016   HCT 43.2 09/01/2016   MCV 95.0 09/01/2016   PLT 222.0 09/01/2016  Component Value Date/Time   NA 139 09/01/2016 1235   NA 141  11/21/2014 1137   NA 136 07/09/2013 1803   K 4.3 09/01/2016 1235   K 4.2 07/09/2013 1803   CL 102 09/01/2016 1235   CL 105 07/09/2013 1803   CO2 33 (H) 09/01/2016 1235   CO2 22 07/09/2013 1803   GLUCOSE 100 (H) 09/01/2016 1235   GLUCOSE 96 07/09/2013 1803   BUN 7 09/01/2016 1235   BUN 11 11/21/2014 1137   BUN 16 07/09/2013 1803   CREATININE 0.98 09/01/2016 1235   CREATININE 1.13 07/09/2013 1803   CALCIUM 10.3 09/01/2016 1235   CALCIUM 9.6 07/09/2013 1803   PROT 7.2 09/01/2016 1235   PROT 6.9 11/21/2014 1137   ALBUMIN 4.2 09/01/2016 1235   ALBUMIN 4.3 11/21/2014 1137   AST 17 09/01/2016 1235   ALT 13 09/01/2016 1235   ALKPHOS 70 09/01/2016 1235   BILITOT 0.4 09/01/2016 1235   BILITOT 0.3 11/21/2014 1137   GFRNONAA 53 (L) 06/18/2016 1000   GFRNONAA 55 (L) 07/09/2013 1803   GFRAA >60 06/18/2016 1000   GFRAA >60 07/09/2013 1803   Lab Results  Component Value Date   CHOL 266 (H) 09/01/2016   HDL 62.20 09/01/2016   LDLCALC 178 (H) 09/01/2016   LDLDIRECT 206.5 01/24/2013   TRIG 130.0 09/01/2016   CHOLHDL 4 09/01/2016   Lab Results  Component Value Date   HGBA1C 5.9 09/01/2016   Lab Results  Component Value Date   VITAMINB12 304 10/17/2006   Lab Results  Component Value Date   TSH 1.53 09/01/2016      ASSESSMENT AND PLAN 61 y.o. year old female  has a past medical history of Allergic rhinitis; Allergy; Anxiety; Cataract; Colon polyps (08/2008); COPD (chronic obstructive pulmonary disease) ([place]); Depression; Diabetes mellitus; Gastritis (08/2008); GERD (gastroesophageal reflux disease); History of shingles; Hypercholesteremia; Lung nodule; Menopausal disorder; Multiple sclerosis ([place]); Neuromuscular disorder ([place]); Plantar fasciitis; Seizure disorder ([place]); Seizures ([place]); Urinary incontinence; and Vertigo. here with:  1. Multiple sclerosis  Overall the patient is doing well. She is not on any disease modifying therapy. I instructed the patient she should try to  complete the MRI of the brain again. She prefers to go to [facility] imaging. I will have the order sent to this facility. The patient already has a prescription for Xanax. I advised that she can take 0.5 mg 30 minutes before her scan. She should have someone drive her. She is advised that if her symptoms worsen or she develops new symptoms she should let us know. She will follow-up in 6 months with Dr. Jannifer Franklin.  I spent 15 minutes with the patient. 50% of this time was spent discussing symptoms and MRI of the brain     Ward Givens, MSN, NP-C 09/21/2016, 10:48 AM [facility] [address] [place], [place] [address] [phone]

## 2016-09-21 NOTE — Patient Instructions (Addendum)
Your Plan:  Continue to monitor symptoms Consider trying the MRI again. Can take xanax 0.5 mg 30 minutes before your scan. If your symptoms worsen or you develop new symptoms please let us know.    Thank you for coming to see [place] at [facility]. I hope we have been able to provide you high quality care today.  You may receive a patient satisfaction survey over the next few weeks. We would appreciate your feedback and comments so that we may continue to improve ourselves and the health of our patients.

## 2016-09-23 DIAGNOSIS — M79641 Pain in right hand: Secondary | ICD-10-CM | POA: Diagnosis not present

## 2016-10-05 NOTE — Progress Notes (Addendum)
[facility] Consultation      Assessment and Plan:  Chronic bronchitis.  -Chronic mucous expectoration, and cough, which have improved significantly since smoking cessation. -Explained to the patient that she will continue to have chronic expectoration due to history of smoking, she can treat this symptomatically as needed with over-the-counter medications such as Mucinex.  Excessive daytime sleepiness due to insomnia associated with restless legs syndrome.  -Reports symptoms of restless leg syndrome, leading to insomnia and daytime sleepiness. -Check serum ferritin level, if low, will start iron replacement, if not, can start a primary medication for RLS such as Mirapex or Requip.  ADDENDUM:  Ferritin normal. Sent script for mirapex .125 1-2 tabs 2 hours before bedtime.   Multiple sclerosis.  -Multiple sclerosis can cause significant daytime sleepiness, we'll consider primary treatment with a daytime stimulant as needed, though this does not appear to be necessary at this time.  Lung Nodule.  -Left lower lobe lung nodule has been stable for several years, no need for further follow-up.   Date: 10/05/2016  MRN# [id number] Karina [name] [birth year]    Karina [name] is a 61 y.o. old female seen in consultation for chief complaint of:    Chief Complaint  Patient presents with  . Advice Only    Former Engineering geologist patient:  . Cough    Pt reports cough much better since she quit smoking 8 weeks ago.    HPI:   The patient is a 61 year old female with a history of multiple sclerosis, excessive daytime sleepiness, smoking. She quit smoking about 8 weeks ago and is now hardly coughing anymore and her breathing is significantly improved. She has gained 20 lbs since that time.  She notes that she is sleepy during the day, she takes a nap. She has trouble sleeping through the night. She takes has trouble falling asleep due to her legs hurting and aching and she has  to move them a lot. She was on ventolin and has not had to use it other than on very humid days.   I personally reviewed, images; chest x-ray 05/18/16, unremarkable lungs, CT abdomen 06/18/16 LLL nodule unchanged from 2010.     Current Outpatient Prescriptions:  .  albuterol (VENTOLIN HFA) 108 (90 Base) MCG/ACT inhaler, INHALE 2 PUFFS UP TO EVERY 4 HOURS AS NEEDED FOR WHEEZING, Disp: 3 Inhaler, Rfl: 3 .  ALPRAZolam (XANAX) 0.5 MG tablet, Take 1 tablet (0.5 mg total) by mouth 2 (two) times daily as needed. For anxiety., Disp: 30 tablet, Rfl: 0 .  azelastine (ASTELIN) 0.1 % nasal spray, Place 2 sprays into both nostrils 2 (two) times daily as needed., Disp: , Rfl: 12 .  Blood Glucose Monitoring Suppl (ACCU-CHEK AVIVA PLUS) w/Device KIT, Check blood sugar once daily and as directed Dx R73.09, Disp: 1 kit, Rfl: 0 .  budesonide-formoterol (SYMBICORT) 160-4.5 MCG/ACT inhaler, Inhale 2 puffs into the lungs 2 (two) times daily., Disp: 3 Inhaler, Rfl: 2 .  CRESTOR 20 MG tablet, Take 1 tablet (20 mg total) by mouth daily. (Patient taking differently: Take 10 mg by mouth daily. ), Disp: 90 tablet, Rfl: 3 .  Diclofenac Sodium 3 % GEL, Place 1 application onto the skin every 12 (twelve) hours as needed., Disp: 50 g, Rfl: 0 .  gabapentin (NEURONTIN) 300 MG capsule, TAKE 2 CAPSULES AT BEDTIME, Disp: 180 capsule, Rfl: 3 .  omeprazole (PRILOSEC) 40 MG capsule, Take 1 capsule (40 mg total) by mouth 2 (two) times daily., Disp: 180  capsule, Rfl: 3 .  Probiotic Product (ALIGN PO), Take by mouth daily., Disp: , Rfl:    Allergies:  Percocet [oxycodone-acetaminophen]; Tecfidera [dimethyl fumarate]; Amitriptyline hcl; Atorvastatin; Betaseron [interferon beta-1b]; Clarithromycin; Duloxetine; Levofloxacin; Pregabalin; Pseudoephedrine; and Zoloft [sertraline hcl]  Review of Systems: Gen:  Denies  fever, sweats, chills HEENT: Denies blurred vision, double vision. bleeds, sore throat Cvc:  No dizziness, chest pain. Resp:    Denies cough or sputum production, shortness of breath Gi: Denies swallowing difficulty, stomach pain. Gu:  Denies bladder incontinence, burning urine Ext:   No Joint pain, stiffness. Skin: No skin rash,  hives  Endoc:  No polyuria, polydipsia. Psych: No depression, insomnia. Other:  All other systems were reviewed with the patient and were negative other that what is mentioned in the HPI.   Physical Examination:   VS: BP 128/88 (BP Location: Left Arm, Patient Position: Sitting, Cuff Size: Normal)   Pulse 77   Resp 16   Ht '5\' 1"'$  (1.549 m)   Wt 197 lb (89.4 kg)   SpO2 97%   BMI 37.22 kg/m   General Appearance: No distress  Neuro:without focal findings,  speech normal,  HEENT: PERRLA, EOM intact.   Pulmonary: normal breath sounds, No wheezing.  CardiovascularNormal S1,S2.  No m/r/g.   Abdomen: Benign, Soft, non-tender. Renal:  No costovertebral tenderness  GU:  No performed at this time. Endoc: No evident thyromegaly, no signs of acromegaly. Skin:   warm, no rashes, no ecchymosis  Extremities: normal, no cyanosis, clubbing.  Other findings:    LABORATORY PANEL:   CBC No results for input(s): WBC, HGB, HCT, PLT in the last 168 hours. ------------------------------------------------------------------------------------------------------------------  Chemistries  No results for input(s): NA, K, CL, CO2, GLUCOSE, BUN, CREATININE, CALCIUM, MG, AST, ALT, ALKPHOS, BILITOT in the last 168 hours.  Invalid input(s): GFRCGP ------------------------------------------------------------------------------------------------------------------  Cardiac Enzymes No results for input(s): TROPONINI in the last 168 hours. ------------------------------------------------------------  RADIOLOGY:  No results found.     Thank  you for the consultation and for allowing [facility] to assist in the care of your patient. Our recommendations are noted above.  Please  contact us if we can be of further service.   Marda Stalker, MD.  Board Certified in Internal Medicine, Pulmonary Medicine, [place], and Sleep Medicine.  [facility] Office Number: [phone]  Patricia Pesa, M.D.  Merton Border, M.D  10/05/2016

## 2016-10-07 ENCOUNTER — Other Ambulatory Visit
Admission: RE | Admit: 2016-10-07 | Discharge: 2016-10-07 | Disposition: A | Discharge status: Home / Self Care | Payer: Medicare HMO | Source: Ambulatory Visit | Attending: Internal Medicine | Admitting: Internal Medicine

## 2016-10-07 ENCOUNTER — Encounter: Payer: Self-pay | Admitting: Internal Medicine

## 2016-10-07 ENCOUNTER — Ambulatory Visit (INDEPENDENT_AMBULATORY_CARE_PROVIDER_SITE_OTHER): Payer: Medicare HMO | Admitting: Internal Medicine

## 2016-10-07 ENCOUNTER — Telehealth: Payer: Self-pay | Admitting: *Deleted

## 2016-10-07 VITALS — BP 128/88 | HR 77 | Resp 16 | Ht 61.0 in | Wt 197.0 lb

## 2016-10-07 DIAGNOSIS — G4700 Insomnia, unspecified: Secondary | ICD-10-CM | POA: Diagnosis not present

## 2016-10-07 DIAGNOSIS — G2581 Restless legs syndrome: Secondary | ICD-10-CM

## 2016-10-07 DIAGNOSIS — J42 Unspecified chronic bronchitis: Secondary | ICD-10-CM | POA: Diagnosis not present

## 2016-10-07 LAB — FERRITIN: Ferritin: 138 ng/mL (ref 11–307)

## 2016-10-07 MED ORDER — PRAMIPEXOLE DIHYDROCHLORIDE 0.125 MG PO TABS: ORAL_TABLET | ORAL | 1 refills | Status: DC

## 2016-10-07 MED ORDER — PRAMIPEXOLE DIHYDROCHLORIDE 0.125 MG PO TABS: ORAL_TABLET | ORAL | 11 refills | Status: DC

## 2016-10-07 NOTE — Addendum Note (Signed)
Addended by: Laverle Hobby on: 10/07/2016 03:18 PM   Modules accepted: Orders

## 2016-10-07 NOTE — Addendum Note (Signed)
Addended by: Devona Konig on: 10/07/2016 09:12 AM   Modules accepted: Orders

## 2016-10-07 NOTE — Telephone Encounter (Signed)
Patient aware ferritin normal. Rx will be sent to [facility].

## 2016-10-07 NOTE — Patient Instructions (Addendum)
Congratulations on quitting smoking!  Will check a blood iron level for your restless legs, if this is low we will start iron tablets. If this is normal will start a medication specifically for restless legs.

## 2016-10-07 NOTE — Addendum Note (Signed)
Addended by: Devona Konig on: 10/07/2016 09:03 AM   Modules accepted: Orders

## 2016-10-19 ENCOUNTER — Telehealth: Payer: Self-pay | Admitting: Internal Medicine

## 2016-10-19 NOTE — Telephone Encounter (Signed)
Patient calling stating received a prescription Pramipexole  We were to send in another kind of medication, she is confused  She refuses to take this She states she googled this medication and it does something with the chemicals in her brain  She stated that " I am not taking anything else that will ruin my brain anymore than it already is"   Please advise.

## 2016-10-20 NOTE — Telephone Encounter (Signed)
Patient read the side effect of Mirapex. She refuses to take. She thought you were sending in Requip. Please advise.

## 2016-10-20 NOTE — Telephone Encounter (Signed)
We called in mirapex. Pramipexole is the other name for this medication. It calms down the  overactivity in the nerves which causes restless legs.

## 2016-10-21 MED ORDER — ROPINIROLE HCL 0.25 MG PO TABS: ORAL_TABLET | ORAL | 2 refills | Status: DC

## 2016-10-21 NOTE — Telephone Encounter (Signed)
Ok can send in requip (aka ropinorole).  0.25 mg , 1 tab 2 hours before bedtime. After 1 week may increase to 2 tabs 2 hours before bedtime.

## 2016-10-21 NOTE — Telephone Encounter (Signed)
Patient notified of medication change. 90 rx called [facility] . 90 Day with refills sent to [facility] mail order.

## 2016-11-29 ENCOUNTER — Ambulatory Visit (INDEPENDENT_AMBULATORY_CARE_PROVIDER_SITE_OTHER): Payer: Medicare HMO | Admitting: Family Medicine

## 2016-11-29 ENCOUNTER — Encounter: Payer: Self-pay | Admitting: Family Medicine

## 2016-11-29 VITALS — BP 124/82 | HR 73 | Temp 98.2°F | Wt 205.5 lb

## 2016-11-29 DIAGNOSIS — R1013 Epigastric pain: Secondary | ICD-10-CM

## 2016-11-29 LAB — CBC WITH DIFFERENTIAL/PLATELET
Basophils Absolute: 0.1 10*3/uL (ref 0.0–0.1)
Basophils Relative: 1.2 % (ref 0.0–3.0)
Eosinophils Absolute: 0.2 10*3/uL (ref 0.0–0.7)
Eosinophils Relative: 2.4 % (ref 0.0–5.0)
HCT: 40.3 % (ref 36.0–46.0)
Hemoglobin: 13.2 g/dL (ref 12.0–15.0)
Lymphocytes Relative: 31.3 % (ref 12.0–46.0)
Lymphs Abs: 2.1 10*3/uL (ref 0.7–4.0)
MCHC: 32.7 g/dL (ref 30.0–36.0)
MCV: 97.6 fl (ref 78.0–100.0)
Monocytes Absolute: 0.5 10*3/uL (ref 0.1–1.0)
Monocytes Relative: 7.1 % (ref 3.0–12.0)
Neutro Abs: 3.9 10*3/uL (ref 1.4–7.7)
Neutrophils Relative %: 58 % (ref 43.0–77.0)
Platelets: 216 10*3/uL (ref 150.0–400.0)
RBC: 4.13 Mil/uL (ref 3.87–5.11)
RDW: 13.9 % (ref 11.5–15.5)
WBC: 6.7 10*3/uL (ref 4.0–10.5)

## 2016-11-29 LAB — COMPREHENSIVE METABOLIC PANEL
ALT: 13 U/L (ref 0–35)
AST: 17 U/L (ref 0–37)
Albumin: 4.1 g/dL (ref 3.5–5.2)
Alkaline Phosphatase: 65 U/L (ref 39–117)
BUN: 9 mg/dL (ref 6–23)
CO2: 32 mEq/L (ref 19–32)
Calcium: 9.8 mg/dL (ref 8.4–10.5)
Chloride: 103 mEq/L (ref 96–112)
Creatinine, Ser: 1.01 mg/dL (ref 0.40–1.20)
GFR: 59.66 mL/min — ABNORMAL LOW (ref >60.00)
Glucose, Bld: 131 mg/dL — ABNORMAL HIGH (ref 70–99)
Potassium: 5 mEq/L (ref 3.5–5.1)
Sodium: 140 mEq/L (ref 135–145)
Total Bilirubin: 0.5 mg/dL (ref 0.2–1.2)
Total Protein: 7.1 g/dL (ref 6.0–8.3)

## 2016-11-29 LAB — LIPASE: Lipase: 56 U/L (ref 11.0–59.0)

## 2016-11-29 MED ORDER — SUCRALFATE 1 G PO TABS: 1.0000 g | ORAL_TABLET | Freq: Three times a day (TID) | ORAL | 0 refills | Status: DC

## 2016-11-29 NOTE — Assessment & Plan Note (Signed)
Ongoing  Not improved with bid omeprazole-will drop back to qd ? If some bloating if from this  Disc diet changes for gerd and gas/bloating  Stop carbonation Trial of carafate Ref to GI (hx of erosive esophagitis in the past)  Lab incl lipase today

## 2016-11-29 NOTE — Progress Notes (Signed)
Subjective:    Patient ID: Karina [name], female    DOB: [birth year], 61 y.o.   MRN: [id number]  HPI Here for c/o of abd pain and bloating   Swelling and tight  Especially after eating  No n/v  Pain is more on the L side (feels full like there is something in there) - tender  No heartburn   ibs is quiet  No change in bowel habits No blood in stool   Drinks diet pepsi 2-3 per day  Does not drink out of straws   Takes advil occ for the last week for sinus pressure    Wt Readings from Last 3 Encounters:  11/29/16 205 lb 8 oz (93.2 kg)  10/07/16 197 lb (89.4 kg)  09/21/16 190 lb 3.2 oz (86.3 kg)  gaining weight- hard to breathe  She is eating healthy- mostly salads (only eats one time per day)  No appetite  Not much exercise  38.83 kg/m  Colonoscopy 8/15 with one small polyp 5 y recall   She has hx of IBS Also GERD with remote hx of erosive esophagitis   (omeprazole inc to 40 mg bid in march)  No change at all/ not worse or improved  egd was 2010   Patient Active Problem List   Diagnosis Date Noted  . Erosive osteoarthritis of both hands 09/09/2016  . Fatigue 09/07/2016  . Dysphagia 09/07/2016  . Bilateral hand pain 09/07/2016  . Rash and nonspecific skin eruption 05/18/2016  . Cough 05/18/2016  . Abdominal pain, diffuse 05/18/2016  . Breast cyst, left 04/30/2016  . Breast pain, right 04/30/2016  . Routine general medical examination at a health care facility 05/06/2015  . Large breasts 05/06/2015  . Lumbar disc disease 11/01/2014  . Knee pain, bilateral 03/11/2014  . Thoracic back pain 11/30/2013  . Encounter for routine gynecological examination 01/31/2013  . Encounter for Medicare annual wellness exam 01/23/2013  . Chest pain 09/20/2012  . SOB (shortness of breath) 09/20/2012  . Dyspnea 09/13/2012  . Asthmatic bronchitis , chronic ([place]) 12/06/2011  . Chronic cough 11/22/2011  . Epigastric pain 08/24/2011  . Anxiety disorder 07/17/2009  .  HEMORRHOIDS-EXTERNAL 11/07/2008  . IRRITABLE BOWEL SYNDROME 11/07/2008  . LUNG NODULE 10/14/2008  . HYPERGLYCEMIA, BORDERLINE 10/14/2008  . PERSONAL HX COLONIC POLYPS 10/08/2008  . Vitamin D deficiency 07/23/2008  . SYNCOPE, HX OF 06/01/2008  . FOOT SURGERY, HX OF 06/01/2008  . DILATION AND CURETTAGE, HX OF 06/01/2008  . OSTEOARTHRITIS, SHOULDER, RIGHT 03/22/2008  . ROTATOR CUFF SYNDROME, RIGHT 03/22/2008  . ARTHRALGIA 12/12/2007  . Allergic rhinitis 08/01/2007  . Former smoker 10/17/2006  . Peripheral neuropathy 10/17/2006  . FOOT PAIN, BILATERAL 10/17/2006  . SYMPTOM, EDEMA 10/03/2006  . HYPOGLYCEMIA 09/30/2006  . HYPERCHOLESTEROLEMIA 09/30/2006  . MULTIPLE SCLEROSIS 09/30/2006  . GERD 09/30/2006  . FIBROCYSTIC BREAST DISEASE 09/30/2006  . SEIZURE DISORDER 09/30/2006  . URINARY INCONTINENCE 09/30/2006   Past Medical History:  Diagnosis Date  . Allergic rhinitis   . Allergy   . Anxiety    [facility]- P[facility] Hocking, no per pt  . Cataract   . Colon polyps 08/2008   Adenomatous  . COPD (chronic obstructive pulmonary disease) ([place])   . Depression    no per pt  . Diabetes mellitus   . Gastritis 08/2008   H. pylori  . GERD (gastroesophageal reflux disease)   . History of shingles    Left V1  . Hypercholesteremia   . Lung nodule   .  Menopausal disorder   . Multiple sclerosis ([place])   . Neuromuscular disorder ([place])    Multiple sclerosis  . Plantar fasciitis   . Seizure disorder ([place])    single seizure/hypoglycemia  . Seizures ([place])    1 seizure in 2001 due to blood sugar dropping to 38, none since  . Urinary incontinence   . Vertigo    Past Surgical History:  Procedure Laterality Date  . CERVICAL DISCECTOMY  2004   x 2, Anterior; Fusion C4-5, C5-6, C6-7, Dr. Kary Kos  . CHEST CT  11/2008   With small 4 mm nodule L lung base (rec re check in 1 year)  . CHEST CT  07/2009   Re check chest CT - lung nodule stable  . CHOLECYSTECTOMY    . COLONOSCOPY  08/2008    Polyps/ re check 5 yrs  . CT SINUS LTD W/O CM  02/2009   negative  . DILATION AND CURETTAGE OF UTERUS    . ESOPHAGOGASTRODUODENOSCOPY  08/2008   Erosive gastritis, h pylori (treated)  . FOOT SURGERY     left plantar fascial problem  . ROTATOR CUFF REPAIR     right  . TONSILLECTOMY    . TUBAL LIGATION    . UPPER GASTROINTESTINAL ENDOSCOPY     Social History  Substance Use Topics  . Smoking status: Former Smoker    Years: 30.00    Types: Cigarettes    Quit date: 09/01/2016  . Smokeless tobacco: Never Used  . Alcohol use No   Family History  Problem Relation Age of Onset  . Migraines Mother   . Heart attack Mother   . Coronary artery disease Mother   . Coronary artery disease Father        6 bypasses   . Stroke Father   . Diabetes Father   . Renal Disease Father   . Colon cancer Maternal Grandfather 9  . Coronary artery disease Cousin   . Coronary artery disease Paternal Grandmother   . Lung cancer Paternal Aunt   . Multiple sclerosis Neg Hx   . Esophageal cancer Neg Hx   . Rectal cancer Neg Hx   . Stomach cancer Neg Hx    Allergies  Allergen Reactions  . Percocet [Oxycodone-Acetaminophen] Shortness Of Breath    Felt like going to pass out and sweating  . Tecfidera [Dimethyl Fumarate] Shortness Of Breath, Palpitations, Rash and Cough  . Amitriptyline Hcl     REACTION: swelling and itching  . Atorvastatin     REACTION: elevated LFT's  . Betaseron [Interferon Beta-1b]     Headache  . Clarithromycin     REACTION: reaction not known  . Duloxetine     REACTION: pain  . Levofloxacin     REACTION: Rash  . Pregabalin     REACTION: LE swelling  . Pseudoephedrine     REACTION: legs hurt  . Zoloft [Sertraline Hcl] Other (See Comments)    Headaches    Current Outpatient Prescriptions on File Prior to Visit  Medication Sig Dispense Refill  . albuterol (VENTOLIN HFA) 108 (90 Base) MCG/ACT inhaler INHALE 2 PUFFS UP TO EVERY 4 HOURS AS NEEDED FOR WHEEZING 3 Inhaler 3   . ALPRAZolam (XANAX) 0.5 MG tablet Take 1 tablet (0.5 mg total) by mouth 2 (two) times daily as needed. For anxiety. 30 tablet 0  . azelastine (ASTELIN) 0.1 % nasal spray Place 2 sprays into both nostrils 2 (two) times daily as needed.  12  . Blood Glucose  Monitoring Suppl (ACCU-CHEK AVIVA PLUS) w/Device KIT Check blood sugar once daily and as directed Dx R73.09 1 kit 0  . budesonide-formoterol (SYMBICORT) 160-4.5 MCG/ACT inhaler Inhale 2 puffs into the lungs 2 (two) times daily. 3 Inhaler 2  . CRESTOR 20 MG tablet Take 1 tablet (20 mg total) by mouth daily. (Patient taking differently: Take 10 mg by mouth daily. ) 90 tablet 3  . Diclofenac Sodium 3 % GEL Place 1 application onto the skin every 12 (twelve) hours as needed. 50 g 0  . gabapentin (NEURONTIN) 300 MG capsule TAKE 2 CAPSULES AT BEDTIME 180 capsule 3  . omeprazole (PRILOSEC) 40 MG capsule Take 1 capsule (40 mg total) by mouth 2 (two) times daily. (Patient taking differently: Take 40 mg by mouth daily. ) 180 capsule 3  . Probiotic Product (ALIGN PO) Take by mouth daily.     No current facility-administered medications on file prior to visit.     Review of Systems  Constitutional: Negative for activity change, appetite change, fatigue, fever and unexpected weight change.  HENT: Negative for congestion, ear pain, rhinorrhea, sinus pressure and sore throat.   Eyes: Negative for pain, redness and visual disturbance.  Respiratory: Negative for cough, shortness of breath and wheezing.   Cardiovascular: Negative for chest pain and palpitations.  Gastrointestinal: Positive for abdominal distention and abdominal pain. Negative for blood in stool, constipation, diarrhea, nausea, rectal pain and vomiting.  Endocrine: Negative for polydipsia and polyuria.  Genitourinary: Negative for dysuria, frequency and urgency.  Musculoskeletal: Negative for arthralgias, back pain and myalgias.  Skin: Negative for pallor and rash.  Allergic/Immunologic:  Negative for environmental allergies.  Neurological: Negative for dizziness, syncope and headaches.  Hematological: Negative for adenopathy. Does not bruise/bleed easily.  Psychiatric/Behavioral: Negative for decreased concentration and dysphoric mood. The patient is not nervous/anxious.        Objective:   Physical Exam  Constitutional: She appears well-developed and well-nourished. No distress.  obese and well appearing   HENT:  Head: Normocephalic and atraumatic.  Mouth/Throat: Oropharynx is clear and moist.  Eyes: Pupils are equal, round, and reactive to light. Conjunctivae and EOM are normal. No scleral icterus.  Neck: Normal range of motion. Neck supple.  Cardiovascular: Normal rate, regular rhythm and normal heart sounds.   Pulmonary/Chest: Effort normal and breath sounds normal. No respiratory distress. She has no wheezes. She has no rales.  Abdominal: Soft. Bowel sounds are normal. She exhibits no distension and no mass. There is no hepatosplenomegaly. There is tenderness in the epigastric area and left upper quadrant. There is no rebound, no guarding, no CVA tenderness, no tenderness at McBurney's point and negative Murphy's sign.  Lymphadenopathy:    She has no cervical adenopathy.  Neurological: She is alert.  Skin: Skin is warm and dry. No erythema. No pallor.  No jaundice   Psychiatric: She has a normal mood and affect.          Assessment & Plan:   Problem List Items Addressed This Visit      Other   Epigastric pain - Primary    Ongoing  Not improved with bid omeprazole-will drop back to qd ? If some bloating if from this  Disc diet changes for gerd and gas/bloating  Stop carbonation Trial of carafate Ref to GI (hx of erosive esophagitis in the past)  Lab incl lipase today      Relevant Orders   CBC with Differential/Platelet (Completed)   Comprehensive metabolic panel (Completed)   Lipase (  Completed)   Ambulatory referral to Gastroenterology

## 2016-11-29 NOTE — Patient Instructions (Signed)
Try eating 3 small meals per day- spread it out  Stop carbonated beverages Cut omeprazole back to once daily instead of twice  Try carafate as directed for abdominal pain  Lab today  We will refer you to GI   Avoid ibuprofen/ aleve or other nsaids

## 2016-12-09 ENCOUNTER — Other Ambulatory Visit (INDEPENDENT_AMBULATORY_CARE_PROVIDER_SITE_OTHER): Payer: Medicare HMO

## 2016-12-09 ENCOUNTER — Ambulatory Visit (INDEPENDENT_AMBULATORY_CARE_PROVIDER_SITE_OTHER): Payer: Medicare HMO | Admitting: Physician Assistant

## 2016-12-09 ENCOUNTER — Encounter: Payer: Self-pay | Admitting: Physician Assistant

## 2016-12-09 VITALS — BP 130/90 | HR 76 | Ht 61.0 in | Wt 202.4 lb

## 2016-12-09 DIAGNOSIS — R140 Abdominal distension (gaseous): Secondary | ICD-10-CM | POA: Diagnosis not present

## 2016-12-09 DIAGNOSIS — R110 Nausea: Secondary | ICD-10-CM | POA: Diagnosis not present

## 2016-12-09 DIAGNOSIS — R1084 Generalized abdominal pain: Secondary | ICD-10-CM

## 2016-12-09 DIAGNOSIS — K219 Gastro-esophageal reflux disease without esophagitis: Secondary | ICD-10-CM | POA: Diagnosis not present

## 2016-12-09 LAB — CBC WITH DIFFERENTIAL/PLATELET
Basophils Absolute: 0 10*3/uL (ref 0.0–0.1)
Basophils Relative: 0.3 % (ref 0.0–3.0)
Eosinophils Absolute: 0.2 10*3/uL (ref 0.0–0.7)
Eosinophils Relative: 1.8 % (ref 0.0–5.0)
HCT: 43.6 % (ref 36.0–46.0)
Hemoglobin: 14.5 g/dL (ref 12.0–15.0)
Lymphocytes Relative: 28.8 % (ref 12.0–46.0)
Lymphs Abs: 2.6 10*3/uL (ref 0.7–4.0)
MCHC: 33.3 g/dL (ref 30.0–36.0)
MCV: 96.5 fl (ref 78.0–100.0)
Monocytes Absolute: 0.7 10*3/uL (ref 0.1–1.0)
Monocytes Relative: 7.2 % (ref 3.0–12.0)
Neutro Abs: 5.6 10*3/uL (ref 1.4–7.7)
Neutrophils Relative %: 61.9 % (ref 43.0–77.0)
Platelets: 228 10*3/uL (ref 150.0–400.0)
RBC: 4.52 Mil/uL (ref 3.87–5.11)
RDW: 13.9 % (ref 11.5–15.5)
WBC: 9.1 10*3/uL (ref 4.0–10.5)

## 2016-12-09 LAB — COMPREHENSIVE METABOLIC PANEL
ALT: 16 U/L (ref 0–35)
AST: 17 U/L (ref 0–37)
Albumin: 4.6 g/dL (ref 3.5–5.2)
Alkaline Phosphatase: 82 U/L (ref 39–117)
BUN: 12 mg/dL (ref 6–23)
CO2: 29 mEq/L (ref 19–32)
Calcium: 10.4 mg/dL (ref 8.4–10.5)
Chloride: 100 mEq/L (ref 96–112)
Creatinine, Ser: 1.08 mg/dL (ref 0.40–1.20)
GFR: 55.21 mL/min — ABNORMAL LOW (ref >60.00)
Glucose, Bld: 112 mg/dL — ABNORMAL HIGH (ref 70–99)
Potassium: 4.6 mEq/L (ref 3.5–5.1)
Sodium: 137 mEq/L (ref 135–145)
Total Bilirubin: 0.4 mg/dL (ref 0.2–1.2)
Total Protein: 8.1 g/dL (ref 6.0–8.3)

## 2016-12-09 LAB — LIPASE: Lipase: 60 U/L — ABNORMAL HIGH (ref 11.0–59.0)

## 2016-12-09 MED ORDER — PANTOPRAZOLE SODIUM 40 MG PO TBEC: 40.0000 mg | DELAYED_RELEASE_TABLET | Freq: Two times a day (BID) | ORAL | 2 refills | Status: DC

## 2016-12-09 NOTE — Progress Notes (Signed)
Reviewed and agree with initial management plan.  Tysha Grismore T. Shawnta Zimbelman, MD FACG 

## 2016-12-09 NOTE — Progress Notes (Signed)
Chief Complaint: Abdominal pain, nausea, GERD  HPI:  Karina [name] is a 61 year old Caucasian female with a past medical history as listed below,  who was referred to me by Abner Greenspan, MD for a complaint of abdominal pain, nausea and GERD .      Patient follows with Dr. Fuller Plan. She had an EGD last in 2010 showed moderate erosive gastritis. Patient was recently seen in clinic for a screening colonoscopy 10/11/13. This showed a sessile 5 mm polyp and moderate internal hemorrhoids. Repeat was recommended in 5 years.    Patient had recent labs 11/29/16 which showed a normal CMP, CBC and lipase.    Today, the patient tells me that she has been on Omeprazole 40 mg twice a day as well as Carafate 4 times a day for quite some time but this is not helping with her reflux or an epigastric pain which started occurring a couple of months ago. Patient tells me that if she touches her abdomen in this spot it hurts, this sometimes radiates to both sides of her abdomen and also down above her pelvis. This hurts worse  If the patient goes from standing to sitting or if she eats anything. Patient tells that she also becomes very bloated after eating and looks"like I'm pregnant". Associated symptoms include nausea and a feeling of "strangling on my own spit".    Patient denies fever, chills, blood in her stool, melena, change in bowel habits, weight loss, anorexia, vomiting, changes in medication, changes in diet or sick contacts.  Past Medical History:  Diagnosis Date  . Allergic rhinitis   . Allergy   . Anxiety    [facility]- Perv[facility] Hocking, no per pt  . Cataract   . Colon polyps 08/2008   Adenomatous  . COPD (chronic obstructive pulmonary disease) ([place])   . Depression    no per pt  . Diabetes mellitus   . Gastritis 08/2008   H. pylori  . GERD (gastroesophageal reflux disease)   . History of shingles    Left V1  . Hypercholesteremia   . Lung nodule   . Menopausal disorder   . Multiple sclerosis ([place])   .  Neuromuscular disorder ([place])    Multiple sclerosis  . Plantar fasciitis   . Seizure disorder ([place])    single seizure/hypoglycemia  . Seizures ([place])    1 seizure in 2001 due to blood sugar dropping to 38, none since  . Urinary incontinence   . Vertigo     Past Surgical History:  Procedure Laterality Date  . CERVICAL DISCECTOMY  2004   x 2, Anterior; Fusion C4-5, C5-6, C6-7, Dr. Kary Kos  . CHEST CT  11/2008   With small 4 mm nodule L lung base (rec re check in 1 year)  . CHEST CT  07/2009   Re check chest CT - lung nodule stable  . CHOLECYSTECTOMY    . COLONOSCOPY  08/2008   Polyps/ re check 5 yrs  . CT SINUS LTD W/O CM  02/2009   negative  . DILATION AND CURETTAGE OF UTERUS    . ESOPHAGOGASTRODUODENOSCOPY  08/2008   Erosive gastritis, h pylori (treated)  . FOOT SURGERY     left plantar fascial problem  . ROTATOR CUFF REPAIR     right  . TONSILLECTOMY    . TUBAL LIGATION    . UPPER GASTROINTESTINAL ENDOSCOPY      Current Outpatient Prescriptions  Medication Sig Dispense Refill  . albuterol (VENTOLIN HFA) 108 (  90 Base) MCG/ACT inhaler INHALE 2 PUFFS UP TO EVERY 4 HOURS AS NEEDED FOR WHEEZING 3 Inhaler 3  . ALPRAZolam (XANAX) 0.5 MG tablet Take 1 tablet (0.5 mg total) by mouth 2 (two) times daily as needed. For anxiety. 30 tablet 0  . azelastine (ASTELIN) 0.1 % nasal spray Place 2 sprays into both nostrils 2 (two) times daily as needed.  12  . Blood Glucose Monitoring Suppl (ACCU-CHEK AVIVA PLUS) w/Device KIT Check blood sugar once daily and as directed Dx R73.09 1 kit 0  . budesonide-formoterol (SYMBICORT) 160-4.5 MCG/ACT inhaler Inhale 2 puffs into the lungs 2 (two) times daily. 3 Inhaler 2  . CRESTOR 20 MG tablet Take 1 tablet (20 mg total) by mouth daily. (Patient taking differently: Take 10 mg by mouth daily. ) 90 tablet 3  . Diclofenac Sodium 3 % GEL Place 1 application onto the skin every 12 (twelve) hours as needed. 50 g 0  . gabapentin (NEURONTIN) 300 MG capsule  TAKE 2 CAPSULES AT BEDTIME 180 capsule 3  . Probiotic Product (ALIGN PO) Take by mouth daily.    . pantoprazole (PROTONIX) 40 MG tablet Take 1 tablet (40 mg total) by mouth 2 (two) times daily before a meal. 60 tablet 2   No current facility-administered medications for this visit.     Allergies as of 12/09/2016 - Review Complete 12/09/2016  Allergen Reaction Noted  . Percocet [oxycodone-acetaminophen] Shortness Of Breath 10/12/2010  . Tecfidera [dimethyl fumarate] Shortness Of Breath, Palpitations, Rash, and Cough 10/22/2011  . Amitriptyline hcl  06/30/2007  . Atorvastatin  09/30/2006  . Betaseron [interferon beta-1b]  02/18/2015  . Clarithromycin  09/30/2006  . Duloxetine  09/30/2006  . Levofloxacin  02/13/2007  . Pregabalin  07/23/2008  . Pseudoephedrine  09/30/2006  . Zoloft [sertraline hcl] Other (See Comments) 09/20/2011    Family History  Problem Relation Age of Onset  . Migraines Mother   . Heart attack Mother   . Coronary artery disease Mother   . Coronary artery disease Father        6 bypasses   . Stroke Father   . Diabetes Father   . Renal Disease Father   . Colon cancer Maternal Grandfather 80  . Coronary artery disease Cousin   . Coronary artery disease Paternal Grandmother   . Lung cancer Paternal Aunt   . Multiple sclerosis Neg Hx   . Esophageal cancer Neg Hx   . Rectal cancer Neg Hx   . Stomach cancer Neg Hx     Social History   Social History  . Marital status: Married    Spouse name: N/A  . Number of children: 3  . Years of education: 9 th   Occupational History  . Disabled  Disabled   Social History Main Topics  . Smoking status: Former Smoker    Years: 30.00    Types: Cigarettes    Quit date: 09/01/2016  . Smokeless tobacco: Never Used  . Alcohol use No  . Drug use: No  . Sexual activity: Not on file   Other Topics Concern  . Not on file   Social History Narrative   Married with 3 children   Daily caffeine use - 2    Disabled.     Education 9th grade    Caffeine one cup of coffee daily.   Patient is right handed.     Review of Systems:    Constitutional: No weight loss, fever or chills Skin: No rash  Cardiovascular: No  chest pain Respiratory: No SOB  Gastrointestinal: See HPI and otherwise negative Genitourinary: No dysuria Neurological: No headache Musculoskeletal: No new muscle or joint pain Hematologic: No bleeding  Psychiatric: No history of depression or anxiety   Physical Exam:  Vital signs: BP 130/90   Pulse 76   Ht '5\' 1"'$  (1.549 m)   Wt 202 lb 6.4 oz (91.8 kg)   BMI 38.24 kg/m   Constitutional:   Pleasant Overweight Caucasian female appears to be in NAD, Well developed, Well nourished, alert and cooperative Head:  Normocephalic and atraumatic. Eyes:   PEERL, EOMI. No icterus. Conjunctiva pink. Ears:  Normal auditory acuity. Neck:  Supple Throat: Oral cavity and pharynx without inflammation, swelling or lesion.  Respiratory: Respirations even and unlabored. Lungs clear to auscultation bilaterally.   No wheezes, crackles, or rhonchi.  Cardiovascular: Normal S1, S2. No MRG. Regular rate and rhythm. No peripheral edema, cyanosis or pallor.  Gastrointestinal:  Soft, mild distention, markedly tender in the epigastrium with voluntary guarding decreased bowel sounds all 4 quadrants, No appreciable masses or hepatomegaly. Rectal:  Not performed.  Msk:  Symmetrical without gross deformities. Without edema, no deformity or joint abnormality.  Neurologic:  Alert and  oriented x4;  grossly normal neurologically.  Skin:   Dry and intact without significant lesions or rashes. Psychiatric:  Demonstrates good judgement and reason without abnormal affect or behaviors.  RELEVANT LABS AND IMAGING: CBC    Component Value Date/Time   WBC 6.7 11/29/2016 1034   RBC 4.13 11/29/2016 1034   HGB 13.2 11/29/2016 1034   HGB 14.6 11/21/2014 1137   HCT 40.3 11/29/2016 1034   HCT 44.2 11/21/2014 1137   PLT 216.0  11/29/2016 1034   PLT 203 11/21/2014 1137   MCV 97.6 11/29/2016 1034   MCV 97 11/21/2014 1137   MCV 96 07/09/2013 1803   MCH 32.1 06/18/2016 1000   MCHC 32.7 11/29/2016 1034   RDW 13.9 11/29/2016 1034   RDW 13.6 11/21/2014 1137   RDW 13.6 07/09/2013 1803   LYMPHSABS 2.1 11/29/2016 1034   LYMPHSABS 2.3 11/21/2014 1137   MONOABS 0.5 11/29/2016 1034   EOSABS 0.2 11/29/2016 1034   EOSABS 0.2 11/21/2014 1137   BASOSABS 0.1 11/29/2016 1034   BASOSABS 0.1 11/21/2014 1137    CMP     Component Value Date/Time   NA 140 11/29/2016 1034   NA 141 11/21/2014 1137   NA 136 07/09/2013 1803   K 5.0 11/29/2016 1034   K 4.2 07/09/2013 1803   CL 103 11/29/2016 1034   CL 105 07/09/2013 1803   CO2 32 11/29/2016 1034   CO2 22 07/09/2013 1803   GLUCOSE 131 (H) 11/29/2016 1034   GLUCOSE 96 07/09/2013 1803   BUN 9 11/29/2016 1034   BUN 11 11/21/2014 1137   BUN 16 07/09/2013 1803   CREATININE 1.01 11/29/2016 1034   CREATININE 1.13 07/09/2013 1803   CALCIUM 9.8 11/29/2016 1034   CALCIUM 9.6 07/09/2013 1803   PROT 7.1 11/29/2016 1034   PROT 6.9 11/21/2014 1137   ALBUMIN 4.1 11/29/2016 1034   ALBUMIN 4.3 11/21/2014 1137   AST 17 11/29/2016 1034   ALT 13 11/29/2016 1034   ALKPHOS 65 11/29/2016 1034   BILITOT 0.5 11/29/2016 1034   BILITOT 0.3 11/21/2014 1137   GFRNONAA 53 (L) 06/18/2016 1000   GFRNONAA 55 (L) 07/09/2013 1803   GFRAA >60 06/18/2016 1000   GFRAA >60 07/09/2013 1803    Assessment: 1.Abdominal pain: Mostly in the epigastrium spreads to  bilateral sides and down to umbilicus, history of erosive gastritis, concern for PUD versus intestinal obstruction with decreased bowel sounds and time of exam today versus other 2. GERD: Increased symptoms over the past 2 months, no relief with Omeprazole 40 twice a day and Carafate 4 times a day per patient, she just stopped using these because they were not working 3. Nausea: With above  Plan: 1. Due to severity of pain on palpation ordered  a CT of the abdomen and pelvis with contrast stat. 2. Ordered repeat labs including a CBC, CMP and lipase 3. Prescribe Pantoprazole 40 mg twice daily, 30-60 minutes before eating. 4. Patient declines antinausea medication today 5. Will advise the patient after results of above studies today.  Ellouise Newer, PA-C [facility] 12/09/2016, 11:24 AM  Cc: [name], Wynelle Fanny, MD

## 2016-12-09 NOTE — Patient Instructions (Signed)
Your physician has requested that you go to the basement for lab work before leaving today.  We have sent the following medications to your pharmacy for you to pick up at your convenience: Pantoprazole 40 mg twice a day  You have been scheduled for a CT scan of the abdomen and pelvis at [facility] ([address]---this is in the same building as [facility]).   You are scheduled on 12-15-16 at 3 pm. You should arrive 15 minutes prior to your appointment time for registration. Please follow the written instructions below on the day of your exam:  WARNING: IF YOU ARE ALLERGIC TO IODINE/X-RAY DYE, PLEASE NOTIFY RADIOLOGY IMMEDIATELY AT [phone]! YOU WILL BE GIVEN A 13 HOUR PREMEDICATION PREP.  1) Do not eat anything after 11 am (4 hours prior to your test) 2) You have been given 2 bottles of oral contrast to drink. The solution may taste               better if refrigerated, but do NOT add ice or any other liquid to this solution. Shake             well before drinking.    Drink 1 bottle of contrast @ 1 pm (2 hours prior to your exam)  Drink 1 bottle of contrast @ 2 pm (1 hour prior to your exam)  You may take any medications as prescribed with a small amount of water except for the following: Metformin, Glucophage, Glucovance, Avandamet, Riomet, Fortamet, Actoplus Met, Janumet, Glumetza or Metaglip. The above medications must be held the day of the exam AND 48 hours after the exam.  The purpose of you drinking the oral contrast is to aid in the visualization of your intestinal tract. The contrast solution may cause some diarrhea. Before your exam is started, you will be given a small amount of fluid to drink. Depending on your individual set of symptoms, you may also receive an intravenous injection of x-ray contrast/dye. Plan on being at [facility] for 30 minutes or longer, depending on the type of exam you are having performed.  This test typically takes 30-45  minutes to complete.  If you have any questions regarding your exam or if you need to reschedule, you may call the CT department at [phone] between the hours of 8:00 am and 5:00 pm, Monday-Friday.  ________________________________________________________________________

## 2016-12-15 ENCOUNTER — Ambulatory Visit (INDEPENDENT_AMBULATORY_CARE_PROVIDER_SITE_OTHER)
Admission: RE | Admit: 2016-12-15 | Discharge: 2016-12-15 | Disposition: A | Discharge status: Home / Self Care | Payer: Medicare HMO | Source: Ambulatory Visit | Attending: Physician Assistant | Admitting: Physician Assistant

## 2016-12-15 DIAGNOSIS — R1084 Generalized abdominal pain: Secondary | ICD-10-CM | POA: Diagnosis not present

## 2016-12-15 DIAGNOSIS — R109 Unspecified abdominal pain: Secondary | ICD-10-CM | POA: Diagnosis not present

## 2016-12-15 MED ORDER — IOPAMIDOL (ISOVUE-300) INJECTION 61%
100.0000 mL | Freq: Once | INTRAVENOUS | Status: AC | PRN
  Administered 2016-12-15: 100 mL via INTRAVENOUS

## 2017-03-31 ENCOUNTER — Emergency Department
Admission: EM | Admit: 2017-03-31 | Discharge: 2017-03-31 | Discharge status: Against Medical Advice | Payer: Medicare HMO | Attending: Emergency Medicine | Admitting: Emergency Medicine

## 2017-03-31 ENCOUNTER — Ambulatory Visit (INDEPENDENT_AMBULATORY_CARE_PROVIDER_SITE_OTHER): Payer: Medicare HMO | Admitting: Family Medicine

## 2017-03-31 ENCOUNTER — Encounter: Payer: Self-pay | Admitting: Family Medicine

## 2017-03-31 ENCOUNTER — Emergency Department: Payer: Medicare HMO

## 2017-03-31 ENCOUNTER — Other Ambulatory Visit: Payer: Self-pay

## 2017-03-31 DIAGNOSIS — Z5321 Procedure and treatment not carried out due to patient leaving prior to being seen by health care provider: Secondary | ICD-10-CM | POA: Insufficient documentation

## 2017-03-31 DIAGNOSIS — J441 Chronic obstructive pulmonary disease with (acute) exacerbation: Secondary | ICD-10-CM | POA: Diagnosis not present

## 2017-03-31 DIAGNOSIS — R0602 Shortness of breath: Secondary | ICD-10-CM | POA: Insufficient documentation

## 2017-03-31 DIAGNOSIS — J45901 Unspecified asthma with (acute) exacerbation: Secondary | ICD-10-CM

## 2017-03-31 DIAGNOSIS — R05 Cough: Secondary | ICD-10-CM | POA: Diagnosis not present

## 2017-03-31 LAB — CBC WITH DIFFERENTIAL/PLATELET
Basophils Absolute: 0 10*3/uL (ref 0–0.1)
Basophils Relative: 0 %
Eosinophils Absolute: 0.2 10*3/uL (ref 0–0.7)
Eosinophils Relative: 2 %
HCT: 45.5 % (ref 35.0–47.0)
Hemoglobin: 15.3 g/dL (ref 12.0–16.0)
Lymphocytes Relative: 34 %
Lymphs Abs: 3.1 10*3/uL (ref 1.0–3.6)
MCH: 31.2 pg (ref 26.0–34.0)
MCHC: 33.5 g/dL (ref 32.0–36.0)
MCV: 93.1 fL (ref 80.0–100.0)
Monocytes Absolute: 0.9 10*3/uL (ref 0.2–0.9)
Monocytes Relative: 10 %
Neutro Abs: 4.8 10*3/uL (ref 1.4–6.5)
Neutrophils Relative %: 54 %
Platelets: 226 10*3/uL (ref 150–440)
RBC: 4.89 MIL/uL (ref 3.80–5.20)
RDW: 13.8 % (ref 11.5–14.5)
WBC: 9 10*3/uL (ref 3.6–11.0)

## 2017-03-31 LAB — COMPREHENSIVE METABOLIC PANEL
ALT: 17 U/L (ref 14–54)
AST: 22 U/L (ref 15–41)
Albumin: 4.5 g/dL (ref 3.5–5.0)
Alkaline Phosphatase: 72 U/L (ref 38–126)
Anion gap: 9 (ref 5–15)
BUN: 10 mg/dL (ref 6–20)
CO2: 26 mmol/L (ref 22–32)
Calcium: 9.6 mg/dL (ref 8.9–10.3)
Chloride: 101 mmol/L (ref 101–111)
Creatinine, Ser: 0.97 mg/dL (ref 0.44–1.00)
GFR calc Af Amer: >60 mL/min (ref >60)
GFR calc non Af Amer: >60 mL/min (ref >60)
Glucose, Bld: 93 mg/dL (ref 65–99)
Potassium: 4.3 mmol/L (ref 3.5–5.1)
Sodium: 136 mmol/L (ref 135–145)
Total Bilirubin: 0.3 mg/dL (ref 0.3–1.2)
Total Protein: 8.1 g/dL (ref 6.5–8.1)

## 2017-03-31 LAB — TROPONIN I: Troponin I: <0.03 ng/mL (ref <0.03)

## 2017-03-31 MED ORDER — IPRATROPIUM-ALBUTEROL 0.5-2.5 (3) MG/3ML IN SOLN
RESPIRATORY_TRACT | Status: AC
  Administered 2017-03-31: 3 mL via RESPIRATORY_TRACT
  Filled 2017-03-31: qty 3

## 2017-03-31 MED ORDER — PREDNISONE 20 MG PO TABS
ORAL_TABLET | ORAL | 0 refills | Status: DC
Start: 2017-03-31 — End: 2017-05-10

## 2017-03-31 MED ORDER — AZITHROMYCIN 250 MG PO TABS: ORAL_TABLET | ORAL | 0 refills | Status: DC

## 2017-03-31 MED ORDER — IPRATROPIUM-ALBUTEROL 0.5-2.5 (3) MG/3ML IN SOLN
3.0000 mL | Freq: Once | RESPIRATORY_TRACT | Status: AC
  Administered 2017-03-31: 3 mL via RESPIRATORY_TRACT

## 2017-03-31 NOTE — Patient Instructions (Addendum)
Use albuterol as needed.. Every 4-6 hours.  Start prednsione taper and course of antibitoics. Make a follow up with pulmonology to discuss a different controller medication given SE to symbicort.  Go to ER if severe shortness of breath.

## 2017-03-31 NOTE — ED Notes (Signed)
Pt seen walking out of waiting room with her husband. Pt did not announce leaving to STAT desk.

## 2017-03-31 NOTE — Assessment & Plan Note (Signed)
Pt decline neb treatment.  CXR clear, ER work up reviewed.  Treat with pred taper and antibiotics. Return precautions given.

## 2017-03-31 NOTE — ED Notes (Signed)
Pt husband to STAT desk. Asking how much longer until pt comes back to exam room and reports pt has been in waiting room for 2 hours now and states they would like to see a doctor. Pt husband reassured that they would see a provider and are next to go back. Informed that rooms were being cleaned and this nurse would update them when I was notified of a room becoming available.

## 2017-03-31 NOTE — ED Triage Notes (Signed)
Patient c/o SOB and productive cough. Patient wheezing with labored respirations in triage. Patient reports hx of COPD.

## 2017-03-31 NOTE — Progress Notes (Signed)
Subjective:    Patient ID: Karina [name], female    DOB: 08-16-1955, 62 y.o.   MRN: [id number]  Cough  This is a new problem. The current episode started in the past 7 days. The problem has been gradually worsening. The cough is productive of sputum ( change in sputum for her). Associated symptoms include ear pain, headaches, myalgias, nasal congestion, postnasal drip, a sore throat and shortness of breath. Pertinent negatives include no ear congestion or fever. Associated symptoms comments:  Bilateral ear pain. The symptoms are aggravated by lying down. Risk factors for lung disease include smoking/tobacco exposure. Treatments tried: mucinex , robitussin. The treatment provided mild relief. Her past medical history is significant for asthma and COPD. There is no history of environmental allergies.  Sore Throat   Associated symptoms include coughing, ear pain, headaches and shortness of breath.  Shortness of Breath  Associated symptoms include ear pain, headaches and a sore throat. Pertinent negatives include no fever. Her past medical history is significant for asthma and COPD.    Went to ER but left due the wait CXR:  No infiltrate  EKG at ER: no sig change from 2015  CMET: nml  troponin I : nml Cbc: nml Ipratropium/albuterol neb helped at ER temporarily. At 1 AM.   HX of asthma and chronic cough  dyspnea  She cannot take symbicort because it makes her cough more.  Albuterol using it every 12 hours.. Didn't help much.  Blood pressure 110/76, pulse 88, temperature 98.6 F (37 C), temperature source Oral, height 5\' 1"  (1.549 m), weight 194 lb (88 kg), SpO2 93 %.  Review of Systems  Constitutional: Negative for fever.  HENT: Positive for ear pain, postnasal drip and sore throat.   Respiratory: Positive for cough and shortness of breath.   Musculoskeletal: Positive for myalgias.  Allergic/Immunologic: Negative for environmental allergies.  Neurological: Positive for headaches.        Objective:   Physical Exam  Constitutional: Vital signs are normal. She appears well-developed and well-nourished. She is cooperative.  Non-toxic appearance. She does not appear ill. No distress.  unkempt  HENT:  Head: Normocephalic.  Right Ear: Hearing, tympanic membrane, external ear and ear canal normal. Tympanic membrane is not erythematous, not retracted and not bulging.  Left Ear: Hearing, tympanic membrane, external ear and ear canal normal. Tympanic membrane is not erythematous, not retracted and not bulging.  Nose: Mucosal edema and rhinorrhea present. Right sinus exhibits no maxillary sinus tenderness and no frontal sinus tenderness. Left sinus exhibits no maxillary sinus tenderness and no frontal sinus tenderness.  Mouth/Throat: Uvula is midline, oropharynx is clear and moist and mucous membranes are normal.  Eyes: Conjunctivae, EOM and lids are normal. Pupils are equal, round, and reactive to light. Lids are everted and swept, no foreign bodies found.  Neck: Trachea normal and normal range of motion. Neck supple. Carotid bruit is not present. No thyroid mass and no thyromegaly present.  Cardiovascular: Normal rate, regular rhythm, S1 normal, S2 normal, normal heart sounds, intact distal pulses and normal pulses. Exam reveals no gallop and no friction rub.  No murmur heard. Pulmonary/Chest: Effort normal. No tachypnea. No respiratory distress. She has no decreased breath sounds. She has wheezes. She has no rhonchi. She has no rales.  Diffuse wheeze  Neurological: She is alert.  Skin: Skin is warm, dry and intact. No rash noted.  Psychiatric: Her speech is normal and behavior is normal. Judgment normal. Her mood appears not anxious. Cognition  and memory are normal. She does not exhibit a depressed mood.          Assessment & Plan:

## 2017-04-01 ENCOUNTER — Ambulatory Visit: Payer: Medicare HMO | Admitting: Neurology

## 2017-04-05 ENCOUNTER — Other Ambulatory Visit: Payer: Self-pay | Admitting: Family Medicine

## 2017-04-05 ENCOUNTER — Telehealth: Payer: Self-pay

## 2017-04-05 DIAGNOSIS — Z139 Encounter for screening, unspecified: Secondary | ICD-10-CM

## 2017-04-05 DIAGNOSIS — L989 Disorder of the skin and subcutaneous tissue, unspecified: Secondary | ICD-10-CM | POA: Insufficient documentation

## 2017-04-05 HISTORY — DX: Disorder of the skin and subcutaneous tissue, unspecified: L98.9

## 2017-04-05 NOTE — Telephone Encounter (Signed)
Ref done  Will route to [facility] 

## 2017-04-05 NOTE — Telephone Encounter (Signed)
Copied from [place] #[id number]. Topic: Referral - Request >> Apr 05, 2017  1:19 PM Scherrie Gerlach wrote: Reason for CRM: pt would like referral to a dermatologist for a spot that is in her right eyebrow that is getting larger and having pain.  Also pt is reporting pain in that eye as well. Pt wants a dermatologist in [place] Pt states she needs referral due to her insurance.

## 2017-04-07 ENCOUNTER — Encounter: Payer: Self-pay | Admitting: Internal Medicine

## 2017-04-07 ENCOUNTER — Ambulatory Visit: Payer: Medicare HMO | Admitting: Internal Medicine

## 2017-04-07 VITALS — BP 136/84 | HR 92 | Resp 16 | Ht 61.0 in | Wt 200.0 lb

## 2017-04-07 DIAGNOSIS — J440 Chronic obstructive pulmonary disease with acute lower respiratory infection: Secondary | ICD-10-CM

## 2017-04-07 DIAGNOSIS — J209 Acute bronchitis, unspecified: Secondary | ICD-10-CM | POA: Diagnosis not present

## 2017-04-07 DIAGNOSIS — R69 Illness, unspecified: Secondary | ICD-10-CM | POA: Diagnosis not present

## 2017-04-07 MED ORDER — IPRATROPIUM-ALBUTEROL 0.5-2.5 (3) MG/3ML IN SOLN
3.0000 mL | Freq: Once | RESPIRATORY_TRACT | Status: AC
Start: 2017-04-07 — End: 2017-04-07
  Administered 2017-04-07: 3 mL via RESPIRATORY_TRACT

## 2017-04-07 MED ORDER — IPRATROPIUM-ALBUTEROL 0.5-2.5 (3) MG/3ML IN SOLN: 3.0000 mL | Freq: Four times a day (QID) | RESPIRATORY_TRACT | 5 refills | Status: DC | PRN

## 2017-04-07 NOTE — Progress Notes (Signed)
[name]      Assessment and Plan:  Acute on Chronic bronchitis.  -Continue symbicort.  --Recently started smoking again.  -Patient was given a nebulizer treatment today in office. -I am prescribing a nebulizer machine and duo nebs to be used 3-4 times per day as needed. -Check sputum culture  Excessive daytime sleepiness due to insomnia associated with restless legs syndrome.  -Reports symptoms of restless leg syndrome, leading to insomnia and daytime sleepiness. -Prescribed mirapex but she did not want to take it after she read the side effects and requested a different medication, then prescribed requip, but decided not to take it.   Multiple sclerosis.  -Multiple sclerosis can cause significant daytime sleepiness, we'll consider primary treatment with a daytime stimulant as needed, though this does not appear to be necessary at this time.  Lung Nodule.  -Left lower lobe lung nodule has been stable for several years, no need for further follow-up.  Nicotine abuse. -She recently restarted smoking, discussed the importance of again stopping smoking, spent greater than 3 minutes in discussion.   Orders Placed This Encounter  Procedures  . AMB REFERRAL FOR DME   Meds ordered this encounter  Medications  . ipratropium-albuterol (DUONEB) 0.5-2.5 (3) MG/3ML SOLN    Sig: Take 3 mLs by nebulization every 6 (six) hours as needed.    Dispense:  360 mL    Refill:  5   .   Date: 04/07/2017  MRN# [id number] Karina [name] 04-07-1955    Karina [name] is a 62 y.o. old female seen in consultation for chief complaint of:    Chief Complaint  Patient presents with  . Bronchitis    Pt here for f/u bronchitis/ she was at ED on 03/31/17.   Marland Kitchen Cough    tan mucus  . nasal drainage  . Shortness of Breath    with exertion  . Wheezing    HPI:   The patient is a 62 year old female with a history of multiple sclerosis, excessive daytime  sleepiness. She comes in today because of dyspnea and chest congestion with excess mucus production and sinus drainage. She notes that sinus drainage is making it hard to breathe. She came to the ED and was given a nebulizer, which helped, she left and saw her PCP, got a  course of prednisone and azithro., which she is completing today.  She had stopped smoking, then restarted, then stopped when this came on.   She has symbicort but she stopped taking it as it made her cough.   Last visit he was noted that she had trouble sleeping due to symptoms of RLS, she was calling the prescription for Mirapex however she read about the side effects and preferred to take Requip which was then prescribed.  I personally reviewed, images; chest x-ray 03/31/17 05/18/16, changes of chronic bronchitis, not significantly changed from previous unremarkable lungs, CT abdomen 06/18/16 LLL nodule unchanged from 2010.     Current Outpatient Medications:  .  albuterol (VENTOLIN HFA) 108 (90 Base) MCG/ACT inhaler, INHALE 2 PUFFS UP TO EVERY 4 HOURS AS NEEDED FOR WHEEZING, Disp: 3 Inhaler, Rfl: 3 .  ALPRAZolam (XANAX) 0.5 MG tablet, Take 1 tablet (0.5 mg total) by mouth 2 (two) times daily as needed. For anxiety., Disp: 30 tablet, Rfl: 0 .  azelastine (ASTELIN) 0.1 % nasal spray, Place 2 sprays into both nostrils 2 (two) times daily as needed., Disp: , Rfl: 12 .  azithromycin (ZITHROMAX) 250 MG tablet,  2 tab po x 1 day then 1 tab po daily, Disp: 6 tablet, Rfl: 0 .  Blood Glucose Monitoring Suppl (ACCU-CHEK AVIVA PLUS) w/Device KIT, Check blood sugar once daily and as directed Dx R73.09, Disp: 1 kit, Rfl: 0 .  budesonide-formoterol (SYMBICORT) 160-4.5 MCG/ACT inhaler, Inhale 2 puffs into the lungs 2 (two) times daily., Disp: 3 Inhaler, Rfl: 2 .  CRESTOR 20 MG tablet, Take 1 tablet (20 mg total) by mouth daily. (Patient taking differently: Take 10 mg by mouth daily. ), Disp: 90 tablet, Rfl: 3 .  Diclofenac Sodium 3 % GEL, Place  1 application onto the skin every 12 (twelve) hours as needed., Disp: 50 g, Rfl: 0 .  gabapentin (NEURONTIN) 300 MG capsule, TAKE 2 CAPSULES AT BEDTIME, Disp: 180 capsule, Rfl: 3 .  pantoprazole (PROTONIX) 40 MG tablet, Take 1 tablet (40 mg total) by mouth 2 (two) times daily before a meal., Disp: 60 tablet, Rfl: 2 .  predniSONE (DELTASONE) 20 MG tablet, 3 tabs by mouth daily x 3 days, then 2 tabs by mouth daily x 2 days then 1 tab by mouth daily x 2 days, Disp: 15 tablet, Rfl: 0 .  Probiotic Product (ALIGN PO), Take by mouth daily., Disp: , Rfl:    Allergies:  Percocet [oxycodone-acetaminophen]; Tecfidera [dimethyl fumarate]; Amitriptyline hcl; Atorvastatin; Betaseron [interferon beta-1b]; Clarithromycin; Duloxetine; Levofloxacin; Pregabalin; Pseudoephedrine; and Zoloft [sertraline hcl]  Review of Systems: Gen:  Denies  fever, sweats, chills HEENT: Denies blurred vision, double vision. bleeds, sore throat Cvc:  No dizziness, chest pain. Resp:   Denies cough or sputum production, shortness of breath Gi: Denies swallowing difficulty, stomach pain. Gu:  Denies bladder incontinence, burning urine Ext:   No Joint pain, stiffness. Skin: No skin rash,  hives  Endoc:  No polyuria, polydipsia. Psych: No depression, insomnia. Other:  All other systems were reviewed with the patient and were negative other that what is mentioned in the HPI.   Physical Examination:   VS: BP 136/84 (BP Location: Left Arm, Cuff Size: Large)   Pulse 92   Resp 16   Ht _0  (1.549 m)   Wt 200 lb (90.7 kg)   SpO2 96%   BMI 37.79 kg/m   General Appearance: No distress  Neuro:without focal findings,  speech normal,  HEENT: PERRLA, EOM intact.   Pulmonary: normal breath sounds, scattered bilateral wheezing.  CardiovascularNormal S1,S2.  No m/r/g.   Abdomen: Benign, Soft, non-tender. Renal:  No costovertebral tenderness  GU:  No performed at this time. Endoc: No evident thyromegaly, no signs of  acromegaly. Skin:   warm, no rashes, no ecchymosis  Extremities: normal, no cyanosis, clubbing.  Other findings:    LABORATORY PANEL:   CBC No results for input(s): WBC, HGB, HCT, PLT in the last 168 hours. ------------------------------------------------------------------------------------------------------------------  Chemistries  No results for input(s): NA, K, CL, CO2, GLUCOSE, BUN, CREATININE, CALCIUM, MG, AST, ALT, ALKPHOS, BILITOT in the last 168 hours.  Invalid input(s): GFRCGP ------------------------------------------------------------------------------------------------------------------  Cardiac Enzymes No results for input(s): TROPONINI in the last 168 hours. ------------------------------------------------------------  RADIOLOGY:  No results found.     Thank  you for the consultation and for allowing [facility] to assist in the care of your patient. Our recommendations are noted above.  Please contact us if we can be of further service.   Marda Stalker, MD.  Board Certified in Internal Medicine, Pulmonary Medicine, [place], and Sleep Medicine.  [facility] Office Number: [phone]  [place], M.D.  Merton Border, M.D  04/07/2017

## 2017-04-08 ENCOUNTER — Other Ambulatory Visit
Admission: RE | Admit: 2017-04-08 | Discharge: 2017-04-08 | Disposition: A | Discharge status: Home / Self Care | Payer: Medicare HMO | Source: Ambulatory Visit | Attending: Internal Medicine | Admitting: Internal Medicine

## 2017-04-08 ENCOUNTER — Telehealth: Payer: Self-pay | Admitting: Internal Medicine

## 2017-04-08 DIAGNOSIS — J440 Chronic obstructive pulmonary disease with acute lower respiratory infection: Secondary | ICD-10-CM | POA: Diagnosis not present

## 2017-04-08 DIAGNOSIS — J209 Acute bronchitis, unspecified: Secondary | ICD-10-CM | POA: Insufficient documentation

## 2017-04-08 LAB — EXPECTORATED SPUTUM ASSESSMENT W REFEX TO RESP CULTURE

## 2017-04-08 LAB — EXPECTORATED SPUTUM ASSESSMENT W GRAM STAIN, RFLX TO RESP C

## 2017-04-08 NOTE — Telephone Encounter (Signed)
Pt has been made aware. Nothing needed.

## 2017-04-08 NOTE — Telephone Encounter (Signed)
Karina [name] from [facility] lab is calling  The sputum specimen is not acceptable and will need to be recollected Please call to discuss [phone]

## 2017-04-08 NOTE — Telephone Encounter (Signed)
Please have patient see how she does over the weekend. If she is feeling the same on Monday, will need to repeat the sputum culture.

## 2017-04-25 DIAGNOSIS — L821 Other seborrheic keratosis: Secondary | ICD-10-CM | POA: Diagnosis not present

## 2017-04-25 DIAGNOSIS — L578 Other skin changes due to chronic exposure to nonionizing radiation: Secondary | ICD-10-CM | POA: Diagnosis not present

## 2017-04-25 DIAGNOSIS — L820 Inflamed seborrheic keratosis: Secondary | ICD-10-CM | POA: Diagnosis not present

## 2017-05-09 ENCOUNTER — Ambulatory Visit: Payer: Medicare HMO

## 2017-05-10 ENCOUNTER — Encounter: Payer: Self-pay | Admitting: Neurology

## 2017-05-10 ENCOUNTER — Ambulatory Visit: Payer: Medicare HMO | Admitting: Neurology

## 2017-05-10 VITALS — BP 141/85 | HR 78 | Wt 197.5 lb

## 2017-05-10 DIAGNOSIS — G35 Multiple sclerosis: Secondary | ICD-10-CM

## 2017-05-10 MED ORDER — GABAPENTIN 300 MG PO CAPS: ORAL_CAPSULE | ORAL | 3 refills | Status: DC

## 2017-05-10 NOTE — Progress Notes (Signed)
Reason for visit: Multiple sclerosis  Karina [name] is an 62 y.o. female  History of present illness:  Karina [name] is a 62 year old right-handed white female with a history of multiple sclerosis and mild diabetes.  The patient is not on any disease modifying agents, she has had problems in the past lying flat for the MRI scan, the MRI of the brain was ordered on her last visit but she claims that no one called to schedule the appointment.  The patient also has had prior neck issues, she continues to have neck stiffness and discomfort, she has pain coming up in the back of the head, pressure sensation and burning.  The patient also reports burning in the feet and some difficulty controlling the bladder and bowels, and episodes of diarrhea.  The patient has some left hand pain.  She does report some blurring of vision off and on, she has not had any falls.  She denies any significant gait instability.  She returns to this office for an evaluation.  Past Medical History:  Diagnosis Date  . Allergic rhinitis   . Allergy   . Anxiety    [place]- Karina [name], no per pt  . Cataract   . Colon polyps 08/2008   Adenomatous  . COPD (chronic obstructive pulmonary disease) ([place])   . Depression    no per pt  . Diabetes mellitus   . Gastritis 08/2008   H. pylori  . GERD (gastroesophageal reflux disease)   . History of shingles    Left V1  . Hypercholesteremia   . Lung nodule   . Menopausal disorder   . Multiple sclerosis ([place])   . Neuromuscular disorder ([place])    Multiple sclerosis  . Plantar fasciitis   . Seizure disorder ([place])    single seizure/hypoglycemia  . Seizures ([place])    1 seizure in 2001 due to blood sugar dropping to 38, none since  . Skin lesion 04/05/2017  . Urinary incontinence   . Vertigo     Past Surgical History:  Procedure Laterality Date  . CERVICAL DISCECTOMY  2004   x 2, Anterior; Fusion C4-5, C5-6, C6-7, Dr. Kary Kos  . CHEST CT  11/2008   With small 4 mm  nodule L lung base (rec re check in 1 year)  . CHEST CT  07/2009   Re check chest CT - lung nodule stable  . CHOLECYSTECTOMY    . COLONOSCOPY  08/2008   Polyps/ re check 5 yrs  . CT SINUS LTD W/O CM  02/2009   negative  . DILATION AND CURETTAGE OF UTERUS    . ESOPHAGOGASTRODUODENOSCOPY  08/2008   Erosive gastritis, h pylori (treated)  . FOOT SURGERY     left plantar fascial problem  . ROTATOR CUFF REPAIR     right  . TONSILLECTOMY    . TUBAL LIGATION    . UPPER GASTROINTESTINAL ENDOSCOPY      Family History  Problem Relation Age of Onset  . Migraines Mother   . Heart attack Mother   . Coronary artery disease Mother   . Coronary artery disease Father        6 bypasses   . Stroke Father   . Diabetes Father   . Renal Disease Father   . Colon cancer Maternal Grandfather 11  . Coronary artery disease Cousin   . Coronary artery disease Paternal Grandmother   . Lung cancer Paternal Aunt   . Multiple sclerosis Neg Hx   .  Esophageal cancer Neg Hx   . Rectal cancer Neg Hx   . Stomach cancer Neg Hx     Social history:  reports that she quit smoking about 8 months ago. Her smoking use included cigarettes. She has a 30.00 pack-year smoking history. she has never used smokeless tobacco. She reports that she does not drink alcohol or use drugs.    Allergies  Allergen Reactions  . Percocet [Oxycodone-Acetaminophen] Shortness Of Breath    Felt like going to pass out and sweating  . Tecfidera [Dimethyl Fumarate] Shortness Of Breath, Palpitations, Rash and Cough  . Amitriptyline Hcl     REACTION: swelling and itching  . Atorvastatin     REACTION: elevated LFT's  . Betaseron [Interferon Beta-1b]     Headache  . Clarithromycin     REACTION: reaction not known  . Duloxetine     REACTION: pain  . Levofloxacin     REACTION: Rash  . Pregabalin     REACTION: LE swelling  . Pseudoephedrine     REACTION: legs hurt  . Zoloft [Sertraline Hcl] Other (See Comments)    Headaches      Medications:  Prior to Admission medications   Medication Sig Start Date End Date Taking? Authorizing Provider  albuterol (VENTOLIN HFA) 108 (90 Base) MCG/ACT inhaler INHALE 2 PUFFS UP TO EVERY 4 HOURS AS NEEDED FOR WHEEZING 09/10/16  Yes [name], Wynelle Fanny, MD  ALPRAZolam (XANAX) 0.5 MG tablet Take 1 tablet (0.5 mg total) by mouth 2 (two) times daily as needed. For anxiety. 02/11/15  Yes [name], Wynelle Fanny, MD  azelastine (ASTELIN) 0.1 % nasal spray Place 2 sprays into both nostrils 2 (two) times daily as needed. 09/20/14  Yes [provider]  Blood Glucose Monitoring Suppl (ACCU-CHEK AVIVA PLUS) w/Device KIT Check blood sugar once daily and as directed Dx R73.09 08/19/15  Yes [name], Wynelle Fanny, MD  budesonide-formoterol (SYMBICORT) 160-4.5 MCG/ACT inhaler Inhale 2 puffs into the lungs 2 (two) times daily. 08/19/15  Yes [name], Marne A, MD  CRESTOR 20 MG tablet Take 1 tablet (20 mg total) by mouth daily. Patient taking differently: Take 10 mg by mouth daily.  08/19/15  Yes Gollan, Kathlene November, MD  Diclofenac Sodium 3 % GEL Place 1 application onto the skin every 12 (twelve) hours as needed. 06/18/16  Yes Orbie Pyo, MD  gabapentin (NEURONTIN) 300 MG capsule One capsule twice during the day and 2 at night 05/10/17  Yes Kathrynn Ducking, MD  ipratropium-albuterol (DUONEB) 0.5-2.5 (3) MG/3ML SOLN Take 3 mLs by nebulization every 6 (six) hours as needed. 04/07/17  Yes Laverle Hobby, MD  pantoprazole (PROTONIX) 40 MG tablet Take 1 tablet (40 mg total) by mouth 2 (two) times daily before a meal. 12/09/16  Yes Lemmon, Lavone Nian, PA  Probiotic Product (ALIGN PO) Take by mouth daily.   Yes [provider]    ROS:  Out of a complete 14 system review of symptoms, the patient complains only of the following symptoms, and all other reviewed systems are negative.  Left hand pain, foot pain Blurred vision Neck pain  Blood pressure (!) 141/85, pulse 78, weight 197 lb 8 oz (89.6  kg).  Physical Exam  General: The patient is alert and cooperative at the time of the examination.  The patient is obese.  Neuromuscular: Patient lacks about 30 degrees of full lateral rotation of the cervical spine bilaterally.  Skin: No significant peripheral edema is noted.   Neurologic Exam  Mental status:  The patient is alert and oriented x 3 at the time of the examination. The patient has apparent normal recent and remote memory, with an apparently normal attention span and concentration ability.   Cranial nerves: Facial symmetry is present. Speech is normal, no aphasia or dysarthria is noted. Extraocular movements are full. Visual fields are full.  Pupils are equal, round, and reactive to light.  Discs are flat bilaterally.  Motor: The patient has good strength in all 4 extremities.  Sensory examination: Soft touch sensation is decreased on the left face, arm, and leg as compared to the right.  Coordination: The patient has good finger-nose-finger and heel-to-shin bilaterally.  Gait and station: The patient has a normal gait. Tandem gait is normal. Romberg is negative. No drift is seen.  Reflexes: Deep tendon reflexes are symmetric.   Assessment/Plan:  1.  Multiple sclerosis  2.  Chronic neck pain, cervicogenic headache  3.  Bilateral foot pain  The patient is not on any disease modifying agents and she does need to have MRI follow-up.  If any changes are noted, we will push for initiation of medical therapy.  The patient is having increased problems with foot pain and she could potentially have a mild diabetic peripheral neuropathy.  She also has neck discomfort.  The gabapentin dose will be increased taking 300 mg twice during the day and 600 mg at night.  A prescription was sent in for this.  The patient will be sent for MRI of the brain.  She will follow-up in 6 months.  Jill Alexanders MD 05/10/2017 12:14 PM  [facility] [address] [place], [place] [address]  Phone [phone] Fax [phone]

## 2017-05-10 NOTE — Patient Instructions (Signed)
   We will check MRI of the brain.  Go up on the gabapentin 300 mg capsules to one twice during the day and 2 at night.  Neurontin (gabapentin) may result in drowsiness, ankle swelling, gait instability, or possibly dizziness. Please contact our office if significant side effects occur with this medication.

## 2017-05-17 ENCOUNTER — Telehealth: Payer: Self-pay | Admitting: *Deleted

## 2017-05-17 NOTE — Telephone Encounter (Signed)
Initiated PA gabapentin 300mg  on covermymeds.Key: [id number]. In process of completing

## 2017-05-18 NOTE — Telephone Encounter (Signed)
Received fax notification from [employer] that [employer] approved effective 03/08/2017-03/07/2018.

## 2017-05-18 NOTE — Telephone Encounter (Signed)
Submitted PA on covermymeds. Waiting on determination from insurance. Should have determination faxed within 1-5 days.

## 2017-05-19 ENCOUNTER — Ambulatory Visit
Admission: RE | Admit: 2017-05-19 | Discharge: 2017-05-19 | Disposition: A | Discharge status: Home / Self Care | Payer: Medicare HMO | Source: Ambulatory Visit | Attending: Neurology | Admitting: Neurology

## 2017-05-19 DIAGNOSIS — G35 Multiple sclerosis: Secondary | ICD-10-CM | POA: Diagnosis not present

## 2017-05-19 MED ORDER — GADOBENATE DIMEGLUMINE 529 MG/ML IV SOLN
18.0000 mL | Freq: Once | INTRAVENOUS | Status: AC | PRN
  Administered 2017-05-19: 18 mL via INTRAVENOUS

## 2017-05-20 ENCOUNTER — Telehealth: Payer: Self-pay | Admitting: Neurology

## 2017-05-20 DIAGNOSIS — M542 Cervicalgia: Secondary | ICD-10-CM

## 2017-05-20 NOTE — Telephone Encounter (Signed)
I called the patient.  MRI of the brain is stable.  The patient has had no changes when compared to a prior study done 2 and half years ago.  The patient is still having cervicogenic headache, the increase in the gabapentin did not help her.  I will get neuromuscular therapy for her.   MRI brain 05/20/17:  IMPRESSION:  Abnormal MRI scan of the brain showing scattered periventricular, subcortical and cerebellar white matter hyperintensities on T2/FLAIR images consistent with demyelinating disease. No enhancing lesions are noted. Presence of T 1 black holes indicates chronic disease. Overall no significant change compared with previous MRI scan dated 12/06/2014

## 2017-06-03 ENCOUNTER — Other Ambulatory Visit: Payer: Self-pay

## 2017-06-03 ENCOUNTER — Ambulatory Visit: Payer: Medicare HMO | Attending: Neurology

## 2017-06-03 DIAGNOSIS — M542 Cervicalgia: Secondary | ICD-10-CM

## 2017-06-03 DIAGNOSIS — M25672 Stiffness of left ankle, not elsewhere classified: Secondary | ICD-10-CM | POA: Diagnosis not present

## 2017-06-03 DIAGNOSIS — M25512 Pain in left shoulder: Secondary | ICD-10-CM | POA: Diagnosis not present

## 2017-06-03 DIAGNOSIS — M62838 Other muscle spasm: Secondary | ICD-10-CM | POA: Diagnosis not present

## 2017-06-03 DIAGNOSIS — G8929 Other chronic pain: Secondary | ICD-10-CM | POA: Diagnosis not present

## 2017-06-03 NOTE — Patient Instructions (Signed)
Shoulder Retraction  inch the pencil  squeeze shoulder together   Tuck in chin and gently pull shoulders back. Hold __3__ seconds. Repeat _10-12___ times. Do _3-4___ sessions per day.  [email or web address].[email or web address]   Copyright  VHI. All rights reserved.

## 2017-06-03 NOTE — Therapy (Signed)
[facility] [address] [place], [place], [address] Phone: [phone]   Fax:  [phone]  Physical Therapy Evaluation  Patient Details  Name: Karina [name] MRN: [id number] Date of Birth: 07-23-1955 Referring Provider: Kathrynn Ducking, MD   Encounter Date: 06/03/2017  PT End of Session - 06/03/17 1314    Visit Number  1    Number of Visits  9    Date for PT Re-Evaluation  07/29/17    PT Start Time  1100    PT Stop Time  1148    PT Time Calculation (min)  48 min    Activity Tolerance  Patient tolerated treatment well;Patient limited by pain    Behavior During Therapy  [facility] for tasks assessed/performed;Anxious       Past Medical History:  Diagnosis Date  . Allergic rhinitis   . Allergy   . Anxiety    [facility]- Pervis Hocking, no per pt  . Cataract   . Colon polyps 08/2008   Adenomatous  . COPD (chronic obstructive pulmonary disease) ([place])   . Depression    no per pt  . Diabetes mellitus   . Gastritis 08/2008   H. pylori  . GERD (gastroesophageal reflux disease)   . History of shingles    Left V1  . Hypercholesteremia   . Lung nodule   . Menopausal disorder   . Multiple sclerosis ([place])   . Neuromuscular disorder ([place])    Multiple sclerosis  . Plantar fasciitis   . Seizure disorder ([place])    single seizure/hypoglycemia  . Seizures ([place])    1 seizure in 2001 due to blood sugar dropping to 38, none since  . Skin lesion 04/05/2017  . Urinary incontinence   . Vertigo     Past Surgical History:  Procedure Laterality Date  . CERVICAL DISCECTOMY  2004   x 2, Anterior; Fusion C4-5, C5-6, C6-7, Dr. Kary Kos  . CHEST CT  11/2008   With small 4 mm nodule L lung base (rec re check in 1 year)  . CHEST CT  07/2009   Re check chest CT - lung nodule stable  . CHOLECYSTECTOMY    . COLONOSCOPY  08/2008   Polyps/ re check 5 yrs  . CT SINUS LTD W/O CM  02/2009   negative  . DILATION AND CURETTAGE OF UTERUS     . ESOPHAGOGASTRODUODENOSCOPY  08/2008   Erosive gastritis, h pylori (treated)  . FOOT SURGERY     left plantar fascial problem  . ROTATOR CUFF REPAIR     right  . TONSILLECTOMY    . TUBAL LIGATION    . UPPER GASTROINTESTINAL ENDOSCOPY      There were no vitals filed for this visit.   Subjective Assessment - 06/03/17 1107    Subjective  Pt is a 62 y/o F who has been referred to OPPT by Kathrynn Ducking, MD for Neck pain on 05/20/2017. pt reports she has posterior L head pain and swelling. pt reports it feels as if someone has had hit her in the head.  pt reports swelling has gone down now but when pain comes on she reports some visual disturbances. Pt report last time it was swollen was 3 days ago and had been that way for about a month. pt reports her L ear as been draining and she has 2 sores  around ears. pt reports she placed alcohol on her head and the sores went down. Pt has been  to PT in the past for neck pain and has had a very poor experience and refused to return after receiving aggressive manual therapy at her cervical spine.      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    Limitations  House hold activities    Patient Stated Goals  understand what is causing her pain in her head/ neck     Currently in Pain?  Yes 10/10 during the week  stopped     Pain Location  Head    Pain Orientation  Left;Right    Pain Descriptors / Indicators  Jabbing;Aching;Tender    Pain Type  Chronic pain    Pain Onset  More than a month ago    Pain Frequency  Constant    Aggravating Factors   putting hear up in pony tail     Pain Relieving Factors  heatting pads,          [facility] PT Assessment - 06/03/17 1105      Assessment   Medical Diagnosis  Neck and Head pain     Referring Provider  Kathrynn Ducking, MD    Onset Date/Surgical Date  05/20/17 referral date    Hand Dominance  Right    Prior Therapy  Yes -- cervical spine       Precautions   Precautions  None      Restrictions   Weight Bearing Restrictions  No      Balance Screen   Has the patient fallen in the past 6 months  No    Has the patient had a decrease in activity level because of a fear of falling?   No    Is the patient reluctant to leave their home because of a fear of falling?   No      Home Film/video editor residence    Living Arrangements  Spouse/significant other    Available Help at Discharge  Family    Type of [place] to enter    Entrance Stairs-Number of Steps  4    Entrance Stairs-Rails  Left;Right;Cannot reach both    [place]  One level    [place] - 2 wheels;Wheelchair - manual      Prior Function   Level of Independence  Independent    Vocation  On disability    Leisure  camping and fishing      Cognition   Overall Cognitive Status  Within Functional Limits for tasks assessed      Observation/Other Assessments   Observations  Small( pen tip sized) sores  in ramdom pattern on Bilateral UE , head, neck, chest    Skin Integrity  no open wounds observed       Posture/Postural Control   Posture/Postural Control  Postural limitations    Postural Limitations  Rounded Shoulders;Forward head;Increased thoracic kyphosis      ROM / Strength   AROM / PROM / Strength  AROM      AROM   Overall AROM   Deficits    AROM Assessment Site  Cervical    Cervical Flexion  30    Cervical Extension  20    Cervical - Right Side Bend  20    Cervical - Left Side Bend  20    Cervical - Right Rotation  mid clavical    Cervical - Left Rotation  mid clavical       Palpation   Palpation comment  point tender at L upper trapezius, levator scapulae, and bilateral middle traps and rhomboids . palpable muscle spasms near occipital  insertion of trapezius      Transfers   Transfers  Independent with all Transfers      Ambulation/Gait   Ambulation/Gait  Yes    Ambulation Distance (Feet)  120 Feet    Assistive device  None    Gait Pattern  Step-through pattern    Ambulation Surface  Level;Indoor              No data recorded  Objective measurements completed on examination: See above findings.     Intervention  -Manual therapy  ---- Sub occipital release 3 min.  -----Cervical axial stretching 5 min. --Response  Mild reduction in cervical stiffness, reduction in muscle spasm,  Mid- thoracic muscle soreness            PT Education - 06/03/17 1312    Education provided  Yes    Education Details  PT POC, posture ed, HEP, muscle spasms and pain generation     Person(s) Educated  Patient    Methods  Explanation;Handout;Demonstration;Tactile cues;Verbal cues    Comprehension  Verbalized understanding;Returned demonstration       PT Short Term Goals - 06/03/17 1319      PT SHORT TERM GOAL #1   Title  see LTG        PT Long Term Goals - 06/03/17 1320      PT LONG TERM GOAL #1   Title  Pt will be independent with HEP in order to maintain progress being made during PT session( ALL GOALS 07/01/2017)    Status  New    Target Date  07/01/17      PT LONG TERM GOAL #2   Baseline  PT will demonstrate methods used to reduce head/cervical spine pain during flare ups     Status  New      PT LONG TERM GOAL #3   Title  Pt will report>/= 1 week without head/cervical spine pain 5/10 or higher to improve QOL    Status  New      PT LONG TERM GOAL #4   Title  Pt will demonstrate an increase of 4* of pain free cervical/capital extension and flexion in order to improve QOL.     Status  New           Plan - 06/03/17 1315    Clinical Impression Statement  Pt presents to PT for evaluation ambulating without any AD and with good gait balance. Upon sitting down to conduct evaluation PT verbalized  and demonstrated apprehensive and defensive behavior. Upon further evaluation of patient head/neck patient Lt. Upper shoulder and cervical spine demonstrated significant muscle spasm with mild muscle guarding bilaterally. Patient also demonstrated recreation of concordant signs and symptoms with cervical flexion as well as with chin tuck (Capital flexion). Further, patient demonstrated mild restriction in all cervical ROM (extension more than other). Patients history of cervical spinal fusion also contributes to current reduction in cervical ROM. Application of gentile upper cervical distraction after light sub-occipital release seemed to mildly reduce patients muscle spasm and pain signs and symptoms. It is likely patients current symptom presentation is the result of poor posture and muscular spasm/guarding. In order for patient to reach  her goals of reduced pain and improved ability to complete ADLs with a reduction in cervical stiffness and pain, pt will benefit greatly from skilled PT intervention to address the issues above.     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    Clinical Presentation  Evolving    Clinical Presentation due to:  uncontrolled DM    Clinical Decision Making  Moderate    Rehab Potential  Fair    Clinical Impairments Affecting Rehab Potential  apprehension to PT services,     PT Frequency  2x / week    PT Duration  4 weeks    PT Treatment/Interventions  ADLs/Self Care Home Management;Moist Heat;Traction;Ultrasound;Electrical Stimulation;Iontophoresis 4mg /ml Dexamethasone;Therapeutic activities;Therapeutic exercise;Patient/family education;Manual techniques;Scar mobilization;Passive range of motion;Dry needling;Energy conservation;Vestibular    PT Next Visit Plan  re-eval pain and posture control, add  to HEP to address posture. light traction and sub occipital release, reduce pain to further build therapeutic alliance. Reduce cervical spine muscle spasm    Consulted and Agree with Plan of Care  Patient         Patient will benefit from skilled therapeutic intervention in order to improve the following deficits and impairments:  Pain, Improper body mechanics, Decreased range of motion, Increased muscle spasms  Visit Diagnosis: Cervicalgia  Other muscle spasm  Chronic left shoulder pain  Stiffness of left ankle, not elsewhere classified     Problem List Patient Active Problem List   Diagnosis Date Noted  . Skin lesion 04/05/2017  . Asthma exacerbation in COPD ([place]) 03/31/2017  . Erosive osteoarthritis of both hands 09/09/2016  . Fatigue 09/07/2016  . Dysphagia 09/07/2016  . Bilateral hand pain 09/07/2016  . Rash and nonspecific skin eruption 05/18/2016  . Cough 05/18/2016  . Abdominal pain, diffuse 05/18/2016  . Breast cyst, left 04/30/2016  . Breast pain, right 04/30/2016  . Routine general medical examination at a health care facility 05/06/2015  . Large breasts 05/06/2015  . Lumbar disc disease 11/01/2014  . Knee pain, bilateral 03/11/2014  . Thoracic back pain 11/30/2013  . Encounter for routine gynecological examination 01/31/2013  . Encounter for Medicare annual wellness exam 01/23/2013  . Chest pain 09/20/2012  . SOB (shortness of breath) 09/20/2012  . Dyspnea 09/13/2012  . Asthmatic bronchitis , chronic ([place]) 12/06/2011  . Chronic cough 11/22/2011  . Epigastric pain 08/24/2011  . Anxiety disorder 07/17/2009  . HEMORRHOIDS-EXTERNAL 11/07/2008  . IRRITABLE BOWEL SYNDROME 11/07/2008  . LUNG NODULE 10/14/2008  . HYPERGLYCEMIA, BORDERLINE 10/14/2008  . PERSONAL HX COLONIC POLYPS 10/08/2008  . Vitamin D deficiency 07/23/2008  . SYNCOPE, HX OF 06/01/2008  . FOOT SURGERY, HX OF 06/01/2008  . DILATION AND CURETTAGE, HX OF 06/01/2008  . OSTEOARTHRITIS,  SHOULDER, RIGHT 03/22/2008  . ROTATOR CUFF SYNDROME, RIGHT 03/22/2008  . ARTHRALGIA 12/12/2007  . Allergic rhinitis 08/01/2007  . Former smoker 10/17/2006  . Peripheral neuropathy 10/17/2006  . FOOT PAIN, BILATERAL 10/17/2006  . SYMPTOM, EDEMA 10/03/2006  . HYPOGLYCEMIA 09/30/2006  . HYPERCHOLESTEROLEMIA 09/30/2006  . MULTIPLE SCLEROSIS 09/30/2006  . GERD 09/30/2006  . FIBROCYSTIC BREAST DISEASE 09/30/2006  . SEIZURE DISORDER 09/30/2006  . URINARY INCONTINENCE 09/30/2006    Waunita Schooner SPT 06/03/2017, 1:54 PM  [facility] [address] [place], [place], [address] Phone: [phone]   Fax:  [phone]  Name: Karina [name] MRN: [id number]  Date of Birth: 1955/03/17

## 2017-06-06 ENCOUNTER — Ambulatory Visit: Payer: Medicare HMO | Attending: Neurology | Admitting: Physical Therapy

## 2017-06-06 ENCOUNTER — Encounter: Payer: Self-pay | Admitting: Physical Therapy

## 2017-06-06 DIAGNOSIS — M25672 Stiffness of left ankle, not elsewhere classified: Secondary | ICD-10-CM | POA: Insufficient documentation

## 2017-06-06 DIAGNOSIS — M542 Cervicalgia: Secondary | ICD-10-CM

## 2017-06-06 DIAGNOSIS — M25512 Pain in left shoulder: Secondary | ICD-10-CM | POA: Insufficient documentation

## 2017-06-06 DIAGNOSIS — M62838 Other muscle spasm: Secondary | ICD-10-CM | POA: Diagnosis not present

## 2017-06-06 DIAGNOSIS — G8929 Other chronic pain: Secondary | ICD-10-CM | POA: Diagnosis not present

## 2017-06-07 NOTE — Therapy (Signed)
[facility] [address] [place], [place], [address] Phone: [phone]   Fax:  [phone]  Physical Therapy Treatment  Patient Details  Name: Karina [name] MRN: [id number] Date of Birth: 1955/10/03 Referring Provider: Kathrynn Ducking, MD   Encounter Date: 06/06/2017  PT End of Session - 06/06/17 1453    Visit Number  2    Number of Visits  9    Date for PT Re-Evaluation  07/29/17    PT Start Time  1962    PT Stop Time  2297    PT Time Calculation (min)  38 min    Activity Tolerance  Patient tolerated treatment well;No increased pain    Behavior During Therapy  [facility] for tasks assessed/performed;Anxious       Past Medical History:  Diagnosis Date  . Allergic rhinitis   . Allergy   . Anxiety    [facility]- Pervis Hocking, no per pt  . Cataract   . Colon polyps 08/2008   Adenomatous  . COPD (chronic obstructive pulmonary disease) ([place])   . Depression    no per pt  . Diabetes mellitus   . Gastritis 08/2008   H. pylori  . GERD (gastroesophageal reflux disease)   . History of shingles    Left V1  . Hypercholesteremia   . Lung nodule   . Menopausal disorder   . Multiple sclerosis ([place])   . Neuromuscular disorder ([place])    Multiple sclerosis  . Plantar fasciitis   . Seizure disorder ([place])    single seizure/hypoglycemia  . Seizures ([place])    1 seizure in 2001 due to blood sugar dropping to 38, none since  . Skin lesion 04/05/2017  . Urinary incontinence   . Vertigo     Past Surgical History:  Procedure Laterality Date  . CERVICAL DISCECTOMY  2004   x 2, Anterior; Fusion C4-5, C5-6, C6-7, Dr. Kary Kos  . CHEST CT  11/2008   With small 4 mm nodule L lung base (rec re check in 1 year)  . CHEST CT  07/2009   Re check chest CT - lung nodule stable  . CHOLECYSTECTOMY    . COLONOSCOPY  08/2008   Polyps/ re check 5 yrs  . CT SINUS LTD W/O CM  02/2009   negative  . DILATION AND CURETTAGE OF UTERUS    .  ESOPHAGOGASTRODUODENOSCOPY  08/2008   Erosive gastritis, h pylori (treated)  . FOOT SURGERY     left plantar fascial problem  . ROTATOR CUFF REPAIR     right  . TONSILLECTOMY    . TUBAL LIGATION    . UPPER GASTROINTESTINAL ENDOSCOPY      There were no vitals filed for this visit.  Subjective Assessment - 06/06/17 1451    Subjective  Pt reports no pain today. Feels her neck pain is due to an ear problem. Reports she has a knot behind her left ear and it went away yesterday. Since then she has not had any pain.     Pertinent History  cervicogenic headache, MS, DM,gastritis, seizure disorder, urinary incontinence, vertigo, COPD, cervical disectomy( fussion c4-7(2004))(2x due to bone graft rejection.    Limitations  House hold activities    Patient Stated Goals  understand what is causing her pain in her head/ neck     Currently in Pain?  No/denies           [facility] Adult PT Treatment/Exercise - 06/06/17 1730  Exercises   Other Exercises   supported seated in chair: posterior shoulder rolls x 10 reps, scapular retraction for 5 sec holds x 10 reps. lateral flexion stretch for 20 sec holds x 2 reps each side, upper trap stretch for 20 sec's x 2 reps. cues needed on correct ex form and technique.       Modalities   Modalities  Ultrasound      Ultrasound   Ultrasound Location  bil cervical paraspinals/upper trap areas    Ultrasound Parameters  100%, 1.5 w/cm2, 1 Mhz x 10 minutes total    Ultrasound Goals  Pain      Manual Therapy   Manual Therapy  Soft tissue mobilization;Myofascial release;Manual Traction    Manual therapy comments  all manual therapy performed in supported sitting. manual therapy performed with goals of decreased pain and decreased mm tightness. Pt did report feeling "more loose" after manual therapy.     Soft tissue mobilization  bil upper traps, scalenes, rhomboids and cervical paraspinals    Myofascial Release  bil cervical paraspinals, upper traps    Manual  Traction  gentle suboccipital release for short duration holds, progressing to gentle cervical distraction with occiptial release.                               PT Short Term Goals - 06/03/17 1319      PT SHORT TERM GOAL #1   Title  see LTG        PT Long Term Goals - 06/03/17 1320      PT LONG TERM GOAL #1   Title  Pt will be independent with HEP in order to maintain progress being made during PT session( ALL GOALS 07/01/2017)    Status  New    Target Date  07/01/17      PT LONG TERM GOAL #2   Baseline  PT will demonstrate methods used to reduce head/cervical spine pain during flare ups     Status  New      PT LONG TERM GOAL #3   Title  Pt will report>/= 1 week without head/cervical spine pain 5/10 or higher to improve QOL    Status  New      PT LONG TERM GOAL #4   Title  Pt will demonstrate an increase of 4* of pain free cervical/capital extension and flexion in order to improve QOL.     Status  New            Plan - 06/06/17 1453    Clinical Impression Statement  Today's skilled session focused on decreased mm tightness and gentle stretching. No issues reported with session today. Pt did report feeling "less tight" after ultrasound and gentle manual therapy. Pt is progressing toward goals and should benefit from continued PT to progress toward unmet goals     Rehab Potential  Fair    Clinical Impairments Affecting Rehab Potential  apprehension to PT services,     PT Frequency  2x / week    PT Duration  4 weeks    PT Treatment/Interventions  ADLs/Self Care Home Management;Moist Heat;Traction;Ultrasound;Electrical Stimulation;Iontophoresis 4mg /ml Dexamethasone;Therapeutic activities;Therapeutic exercise;Patient/family education;Manual techniques;Scar mobilization;Passive range of motion;Dry needling;Energy conservation;Vestibular    PT Next Visit Plan  add to HEP as tolerated (performed gentle ex's/stretches with this session that may be good if did not irritater  her later in day), continue with gentle manual therapy and modalities as needed for  reduces mm spasms/tightness/pain                              Consulted and Agree with Plan of Care  Patient       Patient will benefit from skilled therapeutic intervention in order to improve the following deficits and impairments:  Pain, Improper body mechanics, Decreased range of motion, Increased muscle spasms  Visit Diagnosis: Cervicalgia  Other muscle spasm  Chronic left shoulder pain  Stiffness of left ankle, not elsewhere classified     Problem List Patient Active Problem List   Diagnosis Date Noted  . Skin lesion 04/05/2017  . Asthma exacerbation in COPD ([place]) 03/31/2017  . Erosive osteoarthritis of both hands 09/09/2016  . Fatigue 09/07/2016  . Dysphagia 09/07/2016  . Bilateral hand pain 09/07/2016  . Rash and nonspecific skin eruption 05/18/2016  . Cough 05/18/2016  . Abdominal pain, diffuse 05/18/2016  . Breast cyst, left 04/30/2016  . Breast pain, right 04/30/2016  . Routine general medical examination at a health care facility 05/06/2015  . Large breasts 05/06/2015  . Lumbar disc disease 11/01/2014  . Knee pain, bilateral 03/11/2014  . Thoracic back pain 11/30/2013  . Encounter for routine gynecological examination 01/31/2013  . Encounter for Medicare annual wellness exam 01/23/2013  . Chest pain 09/20/2012  . SOB (shortness of breath) 09/20/2012  . Dyspnea 09/13/2012  . Asthmatic bronchitis , chronic ([place]) 12/06/2011  . Chronic cough 11/22/2011  . Epigastric pain 08/24/2011  . Anxiety disorder 07/17/2009  . HEMORRHOIDS-EXTERNAL 11/07/2008  . IRRITABLE BOWEL SYNDROME 11/07/2008  . LUNG NODULE 10/14/2008  . HYPERGLYCEMIA, BORDERLINE 10/14/2008  . PERSONAL HX COLONIC POLYPS 10/08/2008  . Vitamin D deficiency 07/23/2008  . SYNCOPE, HX OF 06/01/2008  . FOOT SURGERY, HX OF 06/01/2008  . DILATION AND CURETTAGE, HX OF 06/01/2008  . OSTEOARTHRITIS, SHOULDER, RIGHT  03/22/2008  . ROTATOR CUFF SYNDROME, RIGHT 03/22/2008  . ARTHRALGIA 12/12/2007  . Allergic rhinitis 08/01/2007  . Former smoker 10/17/2006  . Peripheral neuropathy 10/17/2006  . FOOT PAIN, BILATERAL 10/17/2006  . SYMPTOM, EDEMA 10/03/2006  . HYPOGLYCEMIA 09/30/2006  . HYPERCHOLESTEROLEMIA 09/30/2006  . MULTIPLE SCLEROSIS 09/30/2006  . GERD 09/30/2006  . FIBROCYSTIC BREAST DISEASE 09/30/2006  . SEIZURE DISORDER 09/30/2006  . URINARY INCONTINENCE 09/30/2006    Willow Ora, PTA, [facility] [address], [place], [place] [address] [phone] 06/07/17, 11:09 AM   Name: AXELLE SZWED MRN: [id number] Date of Birth: 1956/02/23

## 2017-06-16 ENCOUNTER — Ambulatory Visit: Payer: Medicare HMO

## 2017-06-23 ENCOUNTER — Ambulatory Visit: Payer: Medicare HMO | Admitting: Physical Therapy

## 2017-06-28 ENCOUNTER — Ambulatory Visit: Payer: Medicare HMO

## 2017-07-04 NOTE — Progress Notes (Signed)
[facility] Consultation      Assessment and Plan:  Acute on Chronic bronchitis.  -Continue duobebs, can not do inhalers due to allergies/intolerance.    Excessive daytime sleepiness due to insomnia associated with restless legs syndrome.  -Reports symptoms of restless leg syndrome, leading to insomnia and daytime sleepiness. -Intolerant of requip, declined mirapex after reading potential side effects.   Multiple sclerosis.  -Multiple sclerosis can cause significant daytime sleepiness, we'll consider primary treatment with a daytime stimulant as needed, though this does not appear to be necessary at this time.  Lung Nodule.  -Left lower lobe lung nodule has been stable for several years, no need for further follow-up.  Nicotine abuse. -Now smoking a ppd, not really thinking about quitting. Spent 3 min in discussion.    Return in about 6 months (around 01/04/2018).   Date: 07/04/2017  MRN# [id number] Karina [name] 31-Oct-1955    Karina [name] is a 62 y.o. old female seen in consultation for chief complaint of:    Chief Complaint  Patient presents with  . Bronchitis    f/u from 06/05/17: Still smokes.... she c/o cough with foamy sputum  . Shortness of Breath    with exertion  . Wheezing    occasional but denies chest tightness.    HPI:   The patient is a 62 year old female with a history of multiple sclerosis, excessive daytime sleepiness.  She also has a history of acute on chronic bronchitis with excess mucus production, and difficulty with clearance of secretions.  At last visit she was started on duo nebs to be used 3-4 times daily.  She had recently restarted smoking at her last visit and she was strongly encouraged to stop. She is using duoneb once per day, and sometimes at night. She is no longer on inhalers.  She is still smoking about a ppd.   Previously complained of trouble sleeping due to symptoms of RLS, she prescribed Mirapex however  she read about the side effects and preferred to take Requip which was then prescribed. She stopped this as she had some unknown reaction, does not remember.   chest x-ray 03/31/17 05/18/16, changes of chronic bronchitis, not significantly changed from previous unremarkable lungs, CT abdomen 06/18/16 LLL nodule unchanged from 2010.     Current Outpatient Medications:  .  albuterol (VENTOLIN HFA) 108 (90 Base) MCG/ACT inhaler, INHALE 2 PUFFS UP TO EVERY 4 HOURS AS NEEDED FOR WHEEZING, Disp: 3 Inhaler, Rfl: 3 .  ALPRAZolam (XANAX) 0.5 MG tablet, Take 1 tablet (0.5 mg total) by mouth 2 (two) times daily as needed. For anxiety., Disp: 30 tablet, Rfl: 0 .  azelastine (ASTELIN) 0.1 % nasal spray, Place 2 sprays into both nostrils 2 (two) times daily as needed., Disp: , Rfl: 12 .  Blood Glucose Monitoring Suppl (ACCU-CHEK AVIVA PLUS) w/Device KIT, Check blood sugar once daily and as directed Dx R73.09, Disp: 1 kit, Rfl: 0 .  budesonide-formoterol (SYMBICORT) 160-4.5 MCG/ACT inhaler, Inhale 2 puffs into the lungs 2 (two) times daily., Disp: 3 Inhaler, Rfl: 2 .  CRESTOR 20 MG tablet, Take 1 tablet (20 mg total) by mouth daily. (Patient taking differently: Take 10 mg by mouth daily. ), Disp: 90 tablet, Rfl: 3 .  Diclofenac Sodium 3 % GEL, Place 1 application onto the skin every 12 (twelve) hours as needed., Disp: 50 g, Rfl: 0 .  gabapentin (NEURONTIN) 300 MG capsule, One capsule twice during the day and 2 at night, Disp: 360 capsule,  Rfl: 3 .  ipratropium-albuterol (DUONEB) 0.5-2.5 (3) MG/3ML SOLN, Take 3 mLs by nebulization every 6 (six) hours as needed., Disp: 360 mL, Rfl: 5 .  pantoprazole (PROTONIX) 40 MG tablet, Take 1 tablet (40 mg total) by mouth 2 (two) times daily before a meal., Disp: 60 tablet, Rfl: 2 .  Probiotic Product (ALIGN PO), Take by mouth daily., Disp: , Rfl:    Allergies:  Percocet [oxycodone-acetaminophen]; Tecfidera [dimethyl fumarate]; Amitriptyline hcl; Atorvastatin; Betaseron  [interferon beta-1b]; Clarithromycin; Duloxetine; Levofloxacin; Pregabalin; Pseudoephedrine; and Zoloft [sertraline hcl]  Review of Systems: Gen:  Denies  fever, sweats, chills HEENT: Denies blurred vision, double vision. bleeds, sore throat Cvc:  No dizziness, chest pain. Resp:   Denies cough or sputum production, shortness of breath Gi: Denies swallowing difficulty, stomach pain. Gu:  Denies bladder incontinence, burning urine Ext:   No Joint pain, stiffness. Skin: No skin rash,  hives  Endoc:  No polyuria, polydipsia. Psych: No depression, insomnia. Other:  All other systems were reviewed with the patient and were negative other that what is mentioned in the HPI.   Physical Examination:   VS: BP 110/74 (BP Location: Left Arm, Cuff Size: Normal)   Pulse 85   Resp 16   Ht '5\' 1"'$  (1.549 m)   Wt 198 lb (89.8 kg)   SpO2 95%   BMI 37.41 kg/m   General Appearance: No distress  Neuro:without focal findings,  speech normal,  HEENT: PERRLA, EOM intact.   Pulmonary: normal breath sounds, scattered bilateral wheezing.  CardiovascularNormal S1,S2.  No m/r/g.   Abdomen: Benign, Soft, non-tender. Renal:  No costovertebral tenderness  GU:  No performed at this time. Endoc: No evident thyromegaly, no signs of acromegaly. Skin:   warm, no rashes, no ecchymosis  Extremities: normal, no cyanosis, clubbing.  Other findings:    LABORATORY PANEL:   CBC No results for input(s): WBC, HGB, HCT, PLT in the last 168 hours. ------------------------------------------------------------------------------------------------------------------  Chemistries  No results for input(s): NA, K, CL, CO2, GLUCOSE, BUN, CREATININE, CALCIUM, MG, AST, ALT, ALKPHOS, BILITOT in the last 168 hours.  Invalid input(s): GFRCGP ------------------------------------------------------------------------------------------------------------------  Cardiac Enzymes No results for input(s): TROPONINI in the last 168  hours. ------------------------------------------------------------  RADIOLOGY:  No results found.     Thank  you for the consultation and for allowing [facility] to assist in the care of your patient. Our recommendations are noted above.  Please contact us if we can be of further service.   Marda Stalker, MD.  Board Certified in Internal Medicine, Pulmonary Medicine, [place], and Sleep Medicine.  [facility] Office Number: [phone]  Patricia Pesa, M.D.  Merton Border, M.D  07/04/2017

## 2017-07-05 ENCOUNTER — Encounter: Payer: Self-pay | Admitting: Internal Medicine

## 2017-07-05 ENCOUNTER — Ambulatory Visit: Payer: Medicare HMO | Admitting: Internal Medicine

## 2017-07-05 VITALS — BP 110/74 | HR 85 | Resp 16 | Ht 61.0 in | Wt 198.0 lb

## 2017-07-05 DIAGNOSIS — G2581 Restless legs syndrome: Secondary | ICD-10-CM

## 2017-07-05 DIAGNOSIS — J42 Unspecified chronic bronchitis: Secondary | ICD-10-CM

## 2017-07-05 NOTE — Therapy (Signed)
[facility] [address] [place], [place], [address] Phone: [phone]   Fax:  [phone]  Patient Details  Name: Karina [name] MRN: [id number] Date of Birth: 06-01-1955 Referring Provider:  No ref. provider found  Encounter Date: 07/05/2017  PHYSICAL THERAPY DISCHARGE SUMMARY  Visits from Start of Care: 2  Current functional level related to goals / functional outcomes: PT Long Term Goals - 06/03/17 1320      PT LONG TERM GOAL #1   Title  Pt will be independent with HEP in order to maintain progress being made during PT session( ALL GOALS 07/01/2017)    Status  New    Target Date  07/01/17      PT LONG TERM GOAL #2   Baseline  PT will demonstrate methods used to reduce head/cervical spine pain during flare ups     Status  New      PT LONG TERM GOAL #3   Title  Pt will report>/= 1 week without head/cervical spine pain 5/10 or higher to improve QOL    Status  New      PT LONG TERM GOAL #4   Title  Pt will demonstrate an increase of 4* of pain free cervical/capital extension and flexion in order to improve QOL.     Status  New         Remaining deficits: Unknown, as pt did not return after second visit.   Education / Equipment: HEP  Plan: Patient agrees to discharge.  Patient goals were not met. Patient is being discharged due to not returning since the last visit.  ?????       Marcelle Hepner L 07/05/2017, 2:27 PM  [facility] [address] [place], [place], [address] Phone: [phone]   Fax:  [phone]   Geoffry Paradise, PT,DPT 07/05/17 2:27 PM Phone: [phone] Fax: [phone]

## 2017-07-05 NOTE — Patient Instructions (Signed)
Continue duonebs, try to use 2 to 3 times per day.

## 2017-08-11 ENCOUNTER — Encounter: Payer: Self-pay | Admitting: Family Medicine

## 2017-08-11 ENCOUNTER — Ambulatory Visit (INDEPENDENT_AMBULATORY_CARE_PROVIDER_SITE_OTHER): Payer: Medicare HMO | Admitting: Family Medicine

## 2017-08-11 DIAGNOSIS — R5383 Other fatigue: Secondary | ICD-10-CM

## 2017-08-11 DIAGNOSIS — R300 Dysuria: Secondary | ICD-10-CM

## 2017-08-11 LAB — POC URINALSYSI DIPSTICK (AUTOMATED)
Bilirubin, UA: NEGATIVE
Blood, UA: NEGATIVE
Glucose, UA: NEGATIVE
Ketones, UA: NEGATIVE
Leukocytes, UA: NEGATIVE
Nitrite, UA: POSITIVE
Protein, UA: NEGATIVE
Spec Grav, UA: 1.015 (ref 1.010–1.025)
Urobilinogen, UA: 0.2 E.U./dL
pH, UA: 6 (ref 5.0–8.0)

## 2017-08-11 NOTE — Progress Notes (Signed)
Subjective:    Patient ID: Karina [name], female    DOB: 1955-07-07, 62 y.o.   MRN: [id number]  HPI   62 year old female pt of Dr.  Glori Bickers present with dysuria for  2 months.  She also has multiple  Other issues not addressed today given time.. Skin issue, skin dryness.   She states " I have been coming here for 30 years for the same things and no one has found an answer to my issues"   Noted burning with urination intermittantly, increase frequency, some incontinence that wears pads.  Sharp pain in left  Mid back. Occ lower central abdominal pain.   She does drink soda, trying to increase.  No ETOH. She is a smoker   no sex, no vaginal discharge.  No N/V, no fever.   cbc , CMET 03/2017   She has also noted fatigue x 1 year.  Always feels tired. No SOB, no CP.  Vitals:   08/11/17 1603  BP: 110/70  Pulse: 77  Temp: 98.3 F (36.8 C)  SpO2: 94%  Social History /Family History/Past Medical History reviewed in detail and updated in EMR if needed.    Review of Systems  Constitutional: Positive for fatigue. Negative for fever.  HENT: Negative for congestion and ear pain.   Eyes: Negative for pain.  Respiratory: Negative for cough, shortness of breath and wheezing.   Cardiovascular: Negative for chest pain, palpitations and leg swelling.  Gastrointestinal: Negative for abdominal pain.  Genitourinary: Negative for dysuria and vaginal bleeding.  Musculoskeletal: Negative for back pain.  Skin: Positive for rash.  Neurological: Positive for weakness. Negative for syncope, light-headedness and headaches.  Psychiatric/Behavioral: Negative for dysphoric mood.       Objective:   Physical Exam  Constitutional: Vital signs are normal. She appears well-developed and well-nourished. She is cooperative.  Non-toxic appearance. She does not appear ill. No distress.  HENT:  Head: Normocephalic.  Right Ear: Hearing, tympanic membrane, external ear and ear canal normal. Tympanic  membrane is not erythematous, not retracted and not bulging.  Left Ear: Hearing, tympanic membrane, external ear and ear canal normal. Tympanic membrane is not erythematous, not retracted and not bulging.  Nose: No mucosal edema or rhinorrhea. Right sinus exhibits no maxillary sinus tenderness and no frontal sinus tenderness. Left sinus exhibits no maxillary sinus tenderness and no frontal sinus tenderness.  Mouth/Throat: Uvula is midline, oropharynx is clear and moist and mucous membranes are normal.  Eyes: Pupils are equal, round, and reactive to light. Conjunctivae, EOM and lids are normal. Lids are everted and swept, no foreign bodies found.  Neck: Trachea normal and normal range of motion. Neck supple. Carotid bruit is not present. No thyroid mass and no thyromegaly present.  Cardiovascular: Normal rate, regular rhythm, S1 normal, S2 normal, normal heart sounds, intact distal pulses and normal pulses. Exam reveals no gallop and no friction rub.  No murmur heard. Pulmonary/Chest: Effort normal and breath sounds normal. No tachypnea. No respiratory distress. She has no decreased breath sounds. She has no wheezes. She has no rhonchi. She has no rales.  Abdominal: Soft. Normal appearance and bowel sounds are normal. There is tenderness in the suprapubic area. There is CVA tenderness.  Neurological: She is alert.  Skin: Skin is warm, dry and intact. No rash noted.  Psychiatric: Her speech is normal and behavior is normal. Judgment and thought content normal. Her mood appears not anxious. Cognition and memory are normal. She does not exhibit a  depressed mood.          Assessment & Plan:

## 2017-08-11 NOTE — Assessment & Plan Note (Signed)
MAy be dure to MS. Wille val with labs for reversible cause.

## 2017-08-11 NOTE — Addendum Note (Signed)
Addended by: Elmon Kirschner A on: 08/11/2017 04:42 PM   Modules accepted: Orders

## 2017-08-11 NOTE — Assessment & Plan Note (Signed)
No clear UTI but given nitrate in urine.. eval with culture.  Increase water , avoid irritants.

## 2017-08-11 NOTE — Patient Instructions (Addendum)
Incease water.  Avoid bladder irritants.  Please stop at the lab to have labs drawn.  We will call with culture results.

## 2017-08-12 LAB — CBC WITH DIFFERENTIAL/PLATELET
Basophils Absolute: 0.1 10*3/uL (ref 0.0–0.1)
Basophils Relative: 1.1 % (ref 0.0–3.0)
Eosinophils Absolute: 0.1 10*3/uL (ref 0.0–0.7)
Eosinophils Relative: 1.8 % (ref 0.0–5.0)
HCT: 44.8 % (ref 36.0–46.0)
Hemoglobin: 15.1 g/dL — ABNORMAL HIGH (ref 12.0–15.0)
Lymphocytes Relative: 34.4 % (ref 12.0–46.0)
Lymphs Abs: 2.5 10*3/uL (ref 0.7–4.0)
MCHC: 33.8 g/dL (ref 30.0–36.0)
MCV: 96 fl (ref 78.0–100.0)
Monocytes Absolute: 0.5 10*3/uL (ref 0.1–1.0)
Monocytes Relative: 7 % (ref 3.0–12.0)
Neutro Abs: 4.1 10*3/uL (ref 1.4–7.7)
Neutrophils Relative %: 55.7 % (ref 43.0–77.0)
Platelets: 204 10*3/uL (ref 150.0–400.0)
RBC: 4.67 Mil/uL (ref 3.87–5.11)
RDW: 13.4 % (ref 11.5–15.5)
WBC: 7.4 10*3/uL (ref 4.0–10.5)

## 2017-08-12 LAB — COMPREHENSIVE METABOLIC PANEL
ALT: 14 U/L (ref 0–35)
AST: 20 U/L (ref 0–37)
Albumin: 4.4 g/dL (ref 3.5–5.2)
Alkaline Phosphatase: 70 U/L (ref 39–117)
BUN: 10 mg/dL (ref 6–23)
CO2: 28 mEq/L (ref 19–32)
Calcium: 10 mg/dL (ref 8.4–10.5)
Chloride: 100 mEq/L (ref 96–112)
Creatinine, Ser: 0.98 mg/dL (ref 0.40–1.20)
GFR: 61.62 mL/min (ref >60.00)
Glucose, Bld: 79 mg/dL (ref 70–99)
Potassium: 4.5 mEq/L (ref 3.5–5.1)
Sodium: 137 mEq/L (ref 135–145)
Total Bilirubin: 0.4 mg/dL (ref 0.2–1.2)
Total Protein: 7.6 g/dL (ref 6.0–8.3)

## 2017-08-12 LAB — VITAMIN D 25 HYDROXY (VIT D DEFICIENCY, FRACTURES): VITD: 38.37 ng/mL (ref 30.00–100.00)

## 2017-08-12 LAB — VITAMIN B12: Vitamin B-12: 180 pg/mL — ABNORMAL LOW (ref 211–911)

## 2017-08-12 LAB — T4, FREE: Free T4: 0.93 ng/dL (ref 0.60–1.60)

## 2017-08-12 LAB — TSH: TSH: 1.64 u[IU]/mL (ref 0.35–4.50)

## 2017-08-12 LAB — URINE CULTURE
MICRO NUMBER:: 90681821
SPECIMEN QUALITY:: ADEQUATE

## 2017-08-12 LAB — T3, FREE: T3, Free: 3.8 pg/mL (ref 2.3–4.2)

## 2017-08-22 ENCOUNTER — Ambulatory Visit
Admission: RE | Admit: 2017-08-22 | Discharge: 2017-08-22 | Disposition: A | Discharge status: Home / Self Care | Payer: Medicare HMO | Source: Ambulatory Visit | Attending: Family Medicine | Admitting: Family Medicine

## 2017-08-22 DIAGNOSIS — Z139 Encounter for screening, unspecified: Secondary | ICD-10-CM

## 2017-08-22 DIAGNOSIS — Z1231 Encounter for screening mammogram for malignant neoplasm of breast: Secondary | ICD-10-CM | POA: Diagnosis not present

## 2017-08-24 ENCOUNTER — Encounter: Payer: Self-pay | Admitting: Family Medicine

## 2017-08-24 ENCOUNTER — Ambulatory Visit (INDEPENDENT_AMBULATORY_CARE_PROVIDER_SITE_OTHER): Payer: Medicare HMO | Admitting: Family Medicine

## 2017-08-24 ENCOUNTER — Other Ambulatory Visit: Payer: Self-pay | Admitting: Family Medicine

## 2017-08-24 VITALS — BP 114/78 | HR 78 | Temp 98.2°F | Ht 61.0 in | Wt 194.5 lb

## 2017-08-24 DIAGNOSIS — R69 Illness, unspecified: Secondary | ICD-10-CM | POA: Diagnosis not present

## 2017-08-24 DIAGNOSIS — R5382 Chronic fatigue, unspecified: Secondary | ICD-10-CM | POA: Diagnosis not present

## 2017-08-24 DIAGNOSIS — R928 Other abnormal and inconclusive findings on diagnostic imaging of breast: Secondary | ICD-10-CM

## 2017-08-24 DIAGNOSIS — M5442 Lumbago with sciatica, left side: Secondary | ICD-10-CM

## 2017-08-24 DIAGNOSIS — M545 Low back pain, unspecified: Secondary | ICD-10-CM | POA: Insufficient documentation

## 2017-08-24 DIAGNOSIS — G8929 Other chronic pain: Secondary | ICD-10-CM

## 2017-08-24 DIAGNOSIS — E538 Deficiency of other specified B group vitamins: Secondary | ICD-10-CM | POA: Diagnosis not present

## 2017-08-24 DIAGNOSIS — R21 Rash and other nonspecific skin eruption: Secondary | ICD-10-CM

## 2017-08-24 DIAGNOSIS — F17200 Nicotine dependence, unspecified, uncomplicated: Secondary | ICD-10-CM

## 2017-08-24 MED ORDER — CYANOCOBALAMIN 1000 MCG/ML IJ SOLN
1000.0000 ug | Freq: Once | INTRAMUSCULAR | Status: AC
  Administered 2017-08-24: 1000 ug via INTRAMUSCULAR

## 2017-08-24 NOTE — Patient Instructions (Addendum)
Make sure to get back to dermatology for your sores - since you are not improving   Let us know if you want to get back to urology   B12 shot today  Then 3 more in 3 weeks Then we will check labs   We will refer to ortho for your back

## 2017-08-24 NOTE — Progress Notes (Signed)
Subjective:    Patient ID: Karina [name], female    DOB: November 20, 1955, 62 y.o.   MRN: [id number]  HPI Here for f/u of chronic health problems   Saw Dr Diona Browner for dysuria  [employer] frequency some incont wears pads Sharp pain in L mid back -now on R  Little burning to urinate  She was going to consider a bladder tack in the past - was not ready at the time  She cut down her diet soda / drinking more water    (skin stays dry)  occ lower abd  CT oct- tiny cyst in one kidney  Fatigue  over a year   The back pain is bad /also L hip   Skin sores are not improving with eucrisa from derm  Tries to stay out of the sun   ua had nitrate ucx  Neg   Wt Readings from Last 3 Encounters:  08/24/17 194 lb 8 oz (88.2 kg)  08/11/17 195 lb (88.5 kg)  07/05/17 198 lb (89.8 kg)  no appetite  36.75 kg/m   GERD and IBS  BP Readings from Last 3 Encounters:  08/24/17 114/78  08/11/17 110/70  07/05/17 110/74   Pulse Readings from Last 3 Encounters:  08/24/17 78  08/11/17 77  07/05/17 85   Lab Results  Component Value Date   CREATININE 0.98 08/11/2017   BUN 10 08/11/2017   NA 137 08/11/2017   K 4.5 08/11/2017   CL 100 08/11/2017   CO2 28 08/11/2017   Lab Results  Component Value Date   ALT 14 08/11/2017   AST 20 08/11/2017   ALKPHOS 70 08/11/2017   BILITOT 0.4 08/11/2017    Lab Results  Component Value Date   WBC 7.4 08/11/2017   HGB 15.1 (H) 08/11/2017   HCT 44.8 08/11/2017   MCV 96.0 08/11/2017   PLT 204.0 08/11/2017    Lab Results  Component Value Date   VITAMINB12 180 (L) 08/11/2017   Told to start B12 orally  MS - stable   Vit D level in 30s   Mammogram 6/19- needs addnl views   Smoking status  About a ppd again  Wants to quit  Always starts back due to stress- level is better   Hyperlipidemia Lab Results  Component Value Date   CHOL 266 (H) 09/01/2016   HDL 62.20 09/01/2016   LDLCALC 178 (H) 09/01/2016   LDLDIRECT 206.5 01/24/2013   TRIG 130.0 09/01/2016   CHOLHDL 4 09/01/2016   On crestor/diet   Patient Active Problem List   Diagnosis Date Noted  . B12 deficiency 08/24/2017  . Left low back pain 08/24/2017  . Skin lesion 04/05/2017  . Asthma exacerbation in COPD ([place]) 03/31/2017  . Erosive osteoarthritis of both hands 09/09/2016  . Fatigue 09/07/2016  . Dysphagia 09/07/2016  . Bilateral hand pain 09/07/2016  . Rash and nonspecific skin eruption 05/18/2016  . Cough 05/18/2016  . Abdominal pain, diffuse 05/18/2016  . Breast cyst, left 04/30/2016  . Breast pain, right 04/30/2016  . Routine general medical examination at a health care facility 05/06/2015  . Large breasts 05/06/2015  . Lumbar disc disease 11/01/2014  . Knee pain, bilateral 03/11/2014  . Thoracic back pain 11/30/2013  . Encounter for routine gynecological examination 01/31/2013  . Dysuria 01/31/2013  . Encounter for Medicare annual wellness exam 01/23/2013  . Chest pain 09/20/2012  . SOB (shortness of breath) 09/20/2012  . Dyspnea 09/13/2012  . Asthmatic bronchitis , chronic ([place]) 12/06/2011  .  Chronic cough 11/22/2011  . Epigastric pain 08/24/2011  . Anxiety disorder 07/17/2009  . HEMORRHOIDS-EXTERNAL 11/07/2008  . IRRITABLE BOWEL SYNDROME 11/07/2008  . LUNG NODULE 10/14/2008  . HYPERGLYCEMIA, BORDERLINE 10/14/2008  . PERSONAL HX COLONIC POLYPS 10/08/2008  . Vitamin D deficiency 07/23/2008  . SYNCOPE, HX OF 06/01/2008  . FOOT SURGERY, HX OF 06/01/2008  . DILATION AND CURETTAGE, HX OF 06/01/2008  . OSTEOARTHRITIS, SHOULDER, RIGHT 03/22/2008  . ROTATOR CUFF SYNDROME, RIGHT 03/22/2008  . ARTHRALGIA 12/12/2007  . Allergic rhinitis 08/01/2007  . Smoker 10/17/2006  . Peripheral neuropathy 10/17/2006  . FOOT PAIN, BILATERAL 10/17/2006  . SYMPTOM, EDEMA 10/03/2006  . HYPOGLYCEMIA 09/30/2006  . HYPERCHOLESTEROLEMIA 09/30/2006  . MULTIPLE SCLEROSIS 09/30/2006  . GERD 09/30/2006  . FIBROCYSTIC BREAST DISEASE 09/30/2006  . SEIZURE  DISORDER 09/30/2006  . URINARY INCONTINENCE 09/30/2006   Past Medical History:  Diagnosis Date  . Allergic rhinitis   . Allergy   . Anxiety    [place], no per pt  . Cataract   . Colon polyps 08/2008   Adenomatous  . COPD (chronic obstructive pulmonary disease) ([place])   . Depression    no per pt  . Diabetes mellitus   . Gastritis 08/2008   H. pylori  . GERD (gastroesophageal reflux disease)   . History of shingles    Left V1  . Hypercholesteremia   . Lung nodule   . Menopausal disorder   . Multiple sclerosis ([place])   . Neuromuscular disorder ([place])    Multiple sclerosis  . Plantar fasciitis   . Seizure disorder ([place])    single seizure/hypoglycemia  . Seizures ([place])    1 seizure in 2001 due to blood sugar dropping to 38, none since  . Skin lesion 04/05/2017  . Urinary incontinence   . Vertigo    Past Surgical History:  Procedure Laterality Date  . CERVICAL DISCECTOMY  2004   x 2, Anterior; Fusion C4-5, C5-6, C6-7, Dr. Kary Kos  . CHEST CT  11/2008   With small 4 mm nodule L lung base (rec re check in 1 year)  . CHEST CT  07/2009   Re check chest CT - lung nodule stable  . CHOLECYSTECTOMY    . COLONOSCOPY  08/2008   Polyps/ re check 5 yrs  . CT SINUS LTD W/O CM  02/2009   negative  . DILATION AND CURETTAGE OF UTERUS    . ESOPHAGOGASTRODUODENOSCOPY  08/2008   Erosive gastritis, h pylori (treated)  . FOOT SURGERY     left plantar fascial problem  . ROTATOR CUFF REPAIR     right  . TONSILLECTOMY    . TUBAL LIGATION    . UPPER GASTROINTESTINAL ENDOSCOPY     Social History   Tobacco Use  . Smoking status: Former Smoker    Packs/day: 1.00    Years: 30.00    Pack years: 30.00    Types: Cigarettes    Last attempt to quit: 09/01/2016    Years since quitting: 0.9  . Smokeless tobacco: Never Used  Substance Use Topics  . Alcohol use: No    Alcohol/week: 0.0 oz  . Drug use: No   Family History  Problem Relation Age of Onset  . Migraines Mother    . Heart attack Mother   . Coronary artery disease Mother   . Coronary artery disease Father        6 bypasses   . Stroke Father   . Diabetes Father   . Renal  Disease Father   . Colon cancer Maternal Grandfather 53  . Coronary artery disease Cousin   . Coronary artery disease Paternal Grandmother   . Lung cancer Paternal Aunt   . Multiple sclerosis Neg Hx   . Esophageal cancer Neg Hx   . Rectal cancer Neg Hx   . Stomach cancer Neg Hx    Allergies  Allergen Reactions  . Percocet [Oxycodone-Acetaminophen] Shortness Of Breath    Felt like going to pass out and sweating  . Tecfidera [Dimethyl Fumarate] Shortness Of Breath, Palpitations, Rash and Cough  . Amitriptyline Hcl     REACTION: swelling and itching  . Atorvastatin     REACTION: elevated LFT's  . Betaseron [Interferon Beta-1b]     Headache  . Clarithromycin     REACTION: reaction not known  . Duloxetine     REACTION: pain  . Levofloxacin     REACTION: Rash  . Pregabalin     REACTION: LE swelling  . Pseudoephedrine     REACTION: legs hurt  . Zoloft [Sertraline Hcl] Other (See Comments)    Headaches    Current Outpatient Medications on File Prior to Visit  Medication Sig Dispense Refill  . albuterol (VENTOLIN HFA) 108 (90 Base) MCG/ACT inhaler INHALE 2 PUFFS UP TO EVERY 4 HOURS AS NEEDED FOR WHEEZING 3 Inhaler 3  . ALPRAZolam (XANAX) 0.5 MG tablet Take 1 tablet (0.5 mg total) by mouth 2 (two) times daily as needed. For anxiety. 30 tablet 0  . azelastine (ASTELIN) 0.1 % nasal spray Place 2 sprays into both nostrils 2 (two) times daily as needed.  12  . Blood Glucose Monitoring Suppl (ACCU-CHEK AVIVA PLUS) w/Device KIT Check blood sugar once daily and as directed Dx R73.09 1 kit 0  . budesonide-formoterol (SYMBICORT) 160-4.5 MCG/ACT inhaler Inhale 2 puffs into the lungs 2 (two) times daily. 3 Inhaler 2  . CRESTOR 20 MG tablet Take 1 tablet (20 mg total) by mouth daily. (Patient taking differently: Take 10 mg by mouth  daily. ) 90 tablet 3  . cyanocobalamin (,VITAMIN B-12,) 1000 MCG/ML injection Inject 1,000 mcg into the muscle once. Pt to receive injection once weekly for 4weeks    . Diclofenac Sodium 3 % GEL Place 1 application onto the skin every 12 (twelve) hours as needed. 50 g 0  . gabapentin (NEURONTIN) 300 MG capsule One capsule twice during the day and 2 at night 360 capsule 3  . ipratropium-albuterol (DUONEB) 0.5-2.5 (3) MG/3ML SOLN Take 3 mLs by nebulization every 6 (six) hours as needed. 360 mL 5  . pantoprazole (PROTONIX) 40 MG tablet Take 1 tablet (40 mg total) by mouth 2 (two) times daily before a meal. 60 tablet 2  . Probiotic Product (ALIGN PO) Take by mouth daily.     No current facility-administered medications on file prior to visit.     Review of Systems  Constitutional: Positive for fatigue. Negative for activity change, appetite change, fever and unexpected weight change.  HENT: Negative for congestion, ear pain, rhinorrhea, sinus pressure and sore throat.   Eyes: Negative for pain, redness and visual disturbance.  Respiratory: Negative for cough, shortness of breath and wheezing.   Cardiovascular: Negative for chest pain, palpitations and leg swelling.  Gastrointestinal: Negative for abdominal pain, blood in stool, constipation and diarrhea.  Endocrine: Negative for polydipsia and polyuria.  Genitourinary: Positive for frequency and urgency. Negative for decreased urine volume, difficulty urinating and dysuria.  Musculoskeletal: Positive for arthralgias and back pain. Negative for  gait problem and myalgias.  Skin: Negative for pallor and rash.  Allergic/Immunologic: Negative for environmental allergies.  Neurological: Negative for dizziness, syncope and headaches.  Hematological: Negative for adenopathy. Does not bruise/bleed easily.  Psychiatric/Behavioral: Negative for decreased concentration and dysphoric mood. The patient is not nervous/anxious.        Frustrated by fatigue and  other health problems        Objective:   Physical Exam  Constitutional: She appears well-developed and well-nourished. No distress.  obese and well appearing   HENT:  Head: Normocephalic and atraumatic.  Mouth/Throat: Oropharynx is clear and moist.  Eyes: Pupils are equal, round, and reactive to light. Conjunctivae and EOM are normal. No scleral icterus.  Neck: Normal range of motion. Neck supple. No JVD present. Carotid bruit is not present. No thyromegaly present.  Cardiovascular: Normal rate, regular rhythm, normal heart sounds and intact distal pulses. Exam reveals no gallop.  Pulmonary/Chest: Effort normal and breath sounds normal. No respiratory distress. She has no wheezes. She has no rales.  No crackles  Abdominal: Soft. Bowel sounds are normal. She exhibits no distension, no abdominal bruit and no mass. There is no tenderness.  No suprapubic tenderness or fullness     Musculoskeletal: She exhibits tenderness. She exhibits no edema.       Right shoulder: She exhibits decreased range of motion, tenderness and bony tenderness. She exhibits no crepitus, no spasm, normal pulse and normal strength.       Lumbar back: She exhibits decreased range of motion, tenderness and spasm. She exhibits no bony tenderness and no edema.  Poor rom of LS and hips   Tender in L lumbar (more than R ) musculature  Mild spinal tenderness Pain on full flexion      Lymphadenopathy:    She has no cervical adenopathy.  Neurological: She is alert. She has normal strength and normal reflexes. She displays no atrophy. No cranial nerve deficit or sensory deficit. She exhibits normal muscle tone. Coordination normal.  Negative SLR    Skin: Skin is warm and dry. No rash noted. No erythema. No pallor.  Psychiatric: Her affect is blunt.          Assessment & Plan:   Problem List Items Addressed This Visit      Musculoskeletal and Integument   Rash and nonspecific skin eruption    Now more  dermatitis on upper ext and trunk  Seeing dermatology        Other   B12 deficiency - Primary    Lab Results  Component Value Date   VITAMINB12 180 (L) 08/11/2017   Will plan for 4 B12 shots weekly for 4 weeks and then re check level Also continue 1000 mcg daily otc  Hope this will help chronic fatigue      Relevant Medications   cyanocobalamin ((VITAMIN B-12)) injection 1,000 mcg (Completed)   Fatigue    Recent diag of B12 def  Will tx this and see if helpful for chronic fatigue      Left low back pain    Ongoing and I do not think kidney related (that was her worry)  No neuro symptoms besides rad to buttock /hip Had failed consv tx  Will ref to orthopedics Stressed imp of stretching and walking       Relevant Orders   Ambulatory referral to Orthopedic Surgery   Smoker    Disc in detail risks of smoking and possible outcomes including copd, vascular/ heart disease, cancer ,  respiratory and sinus infections  Pt voices understanding

## 2017-08-26 NOTE — Assessment & Plan Note (Signed)
Now more dermatitis on upper ext and trunk  Seeing dermatology

## 2017-08-26 NOTE — Assessment & Plan Note (Signed)
Recent diag of B12 def  Will tx this and see if helpful for chronic fatigue

## 2017-08-26 NOTE — Assessment & Plan Note (Addendum)
Lab Results  Component Value Date   VITAMINB12 180 (L) 08/11/2017   Will plan for 4 B12 shots weekly for 4 weeks and then re check level Also continue 1000 mcg daily [place] this will help chronic fatigue

## 2017-08-26 NOTE — Assessment & Plan Note (Signed)
Ongoing and I do not think kidney related (that was her worry)  No neuro symptoms besides rad to buttock /hip Had failed consv tx  Will ref to orthopedics Stressed imp of stretching and walking

## 2017-08-26 NOTE — Assessment & Plan Note (Signed)
Disc in detail risks of smoking and possible outcomes including copd, vascular/ heart disease, cancer , respiratory and sinus infections  Pt voices understanding  

## 2017-08-27 ENCOUNTER — Other Ambulatory Visit: Payer: Self-pay | Admitting: Neurology

## 2017-08-29 ENCOUNTER — Other Ambulatory Visit: Payer: Self-pay

## 2017-08-29 MED ORDER — GABAPENTIN 300 MG PO CAPS: ORAL_CAPSULE | ORAL | 3 refills | Status: DC

## 2017-08-30 ENCOUNTER — Ambulatory Visit
Admission: RE | Admit: 2017-08-30 | Discharge: 2017-08-30 | Disposition: A | Discharge status: Home / Self Care | Payer: Medicare HMO | Source: Ambulatory Visit | Attending: Family Medicine | Admitting: Family Medicine

## 2017-08-30 DIAGNOSIS — R928 Other abnormal and inconclusive findings on diagnostic imaging of breast: Secondary | ICD-10-CM

## 2017-08-30 DIAGNOSIS — N6320 Unspecified lump in the left breast, unspecified quadrant: Secondary | ICD-10-CM | POA: Diagnosis not present

## 2017-08-31 ENCOUNTER — Ambulatory Visit (INDEPENDENT_AMBULATORY_CARE_PROVIDER_SITE_OTHER): Payer: Medicare HMO | Admitting: *Deleted

## 2017-08-31 DIAGNOSIS — E538 Deficiency of other specified B group vitamins: Secondary | ICD-10-CM | POA: Diagnosis not present

## 2017-08-31 MED ORDER — CYANOCOBALAMIN 1000 MCG/ML IJ SOLN
1000.0000 ug | Freq: Once | INTRAMUSCULAR | Status: AC
Start: 2017-08-31 — End: 2017-08-31
  Administered 2017-08-31: 1000 ug via INTRAMUSCULAR

## 2017-08-31 NOTE — Progress Notes (Signed)
Per orders of Dr. Copland, injection of B12 given by Melenda Bielak M. Patient tolerated injection well. 

## 2017-09-01 DIAGNOSIS — M5416 Radiculopathy, lumbar region: Secondary | ICD-10-CM | POA: Diagnosis not present

## 2017-09-05 ENCOUNTER — Ambulatory Visit: Payer: Medicare HMO | Admitting: Physician Assistant

## 2017-09-07 ENCOUNTER — Ambulatory Visit: Payer: Medicare HMO

## 2017-09-07 ENCOUNTER — Ambulatory Visit (INDEPENDENT_AMBULATORY_CARE_PROVIDER_SITE_OTHER): Payer: Medicare HMO | Admitting: Family Medicine

## 2017-09-07 ENCOUNTER — Encounter: Payer: Self-pay | Admitting: Family Medicine

## 2017-09-07 VITALS — BP 126/68 | HR 88 | Temp 98.1°F | Ht 61.0 in | Wt 190.0 lb

## 2017-09-07 DIAGNOSIS — J209 Acute bronchitis, unspecified: Secondary | ICD-10-CM | POA: Diagnosis not present

## 2017-09-07 DIAGNOSIS — R69 Illness, unspecified: Secondary | ICD-10-CM | POA: Diagnosis not present

## 2017-09-07 DIAGNOSIS — E538 Deficiency of other specified B group vitamins: Secondary | ICD-10-CM

## 2017-09-07 DIAGNOSIS — F17200 Nicotine dependence, unspecified, uncomplicated: Secondary | ICD-10-CM

## 2017-09-07 MED ORDER — CYANOCOBALAMIN 1000 MCG/ML IJ SOLN
1000.0000 ug | Freq: Once | INTRAMUSCULAR | Status: AC
  Administered 2017-09-07: 1000 ug via INTRAMUSCULAR

## 2017-09-07 MED ORDER — PROMETHAZINE-DM 6.25-15 MG/5ML PO SYRP: 5.0000 mL | ORAL_SOLUTION | Freq: Four times a day (QID) | ORAL | 0 refills | Status: DC | PRN

## 2017-09-07 MED ORDER — BENZONATATE 200 MG PO CAPS: 200.0000 mg | ORAL_CAPSULE | Freq: Three times a day (TID) | ORAL | 1 refills | Status: DC | PRN

## 2017-09-07 MED ORDER — PREDNISONE 20 MG PO TABS: ORAL_TABLET | ORAL | 0 refills | Status: DC

## 2017-09-07 MED ORDER — DOXYCYCLINE HYCLATE 100 MG PO TABS: 100.0000 mg | ORAL_TABLET | Freq: Two times a day (BID) | ORAL | 0 refills | Status: DC

## 2017-09-07 MED ORDER — METHYLPREDNISOLONE ACETATE 80 MG/ML IJ SUSP
80.0000 mg | Freq: Once | INTRAMUSCULAR | Status: AC
  Administered 2017-09-07: 80 mg via INTRAMUSCULAR

## 2017-09-07 NOTE — Assessment & Plan Note (Signed)
No cig in 1 week due to illness She plans to quit now  F/u1 week- will disc further

## 2017-09-07 NOTE — Assessment & Plan Note (Signed)
3rd weekly B12 shot today for low B12

## 2017-09-07 NOTE — Progress Notes (Signed)
Subjective:    Patient ID: Karina [name], female    DOB: 1956/02/07, 62 y.o.   MRN: [id number]  HPI Here for multiple medical problems   Wt Readings from Last 3 Encounters:  09/07/17 190 lb (86.2 kg)  08/24/17 194 lb 8 oz (88.2 kg)  08/11/17 195 lb (88.5 kg)   35.90 kg/m    Cough/congestion /nausea  H/o copd  Bad coughing fits - hard to breathe during the spell  A lot of mucous-- thick and yellow / white  Has not smoked in a week  A lot of wheezing   Robitussin and mucinex  NMT twice daily  Symptoms started a week ago   Runny /stuffy nose  L ear hurts as well   Has had nausea (can keep down water)  Also some diarrhea   02 is 95%    Due for B12 shot (getting weekly for 4 weeks)  Lab Results  Component Value Date   VITAMINB12 180 (L) 08/11/2017     Ongoing neck/shoulder/back pain/ interested in breast reduction     Review of Systems     Objective:   Physical Exam  Constitutional: She appears well-developed and well-nourished. No distress.  overwt and tired appearing  Persistent cough   HENT:  Head: Normocephalic and atraumatic.  Right Ear: External ear normal.  Left Ear: External ear normal.  Mouth/Throat: Oropharynx is clear and moist.  Nares are injected and congested  Mild maxillary  sinus tenderness Clear rhinorrhea and post nasal drip   Eyes: Pupils are equal, round, and reactive to light. Conjunctivae and EOM are normal. Right eye exhibits no discharge. Left eye exhibits no discharge.  Neck: Normal range of motion. Neck supple.  Cardiovascular: Normal rate and normal heart sounds.  Pulmonary/Chest: Effort normal. No stridor. No respiratory distress. She has wheezes. She has no rales. She exhibits tenderness.  Diffusely distant bs   Exp wheeze throughout with mildly prolonged exp phase Cough sounds productive Some upper airway sounds No rales     Abdominal: Soft. She exhibits no distension. There is no tenderness.  Lymphadenopathy:      She has no cervical adenopathy.  Neurological: She is alert. No cranial nerve deficit. Coordination normal.  Skin: Skin is warm and dry. No rash noted. No erythema. No pallor.  Psychiatric: She has a normal mood and affect.  pleasant          Assessment & Plan:   Problem List Items Addressed This Visit      Respiratory   Acute bronchitis with bronchospasm    With h/o copd /smoking  Stopped smoking 1 wk ago -enc her not to start back  Significant wheezing on exam but good pulse ox  Depo medrol 80 IM times one today Then start pred 60 mg taper Tessalon and prometh DM for cough  Doxycycline bid for 10 d F/u 1 wk for re check Continue NMTS prn  If suddenly worse- get to ER/pt voices understanding  Meds ordered this encounter  Medications  . benzonatate (TESSALON) 200 MG capsule    Sig: Take 1 capsule (200 mg total) by mouth 3 (three) times daily as needed for cough. Do not bite pill    Dispense:  30 capsule    Refill:  1  . promethazine-dextromethorphan (PROMETHAZINE-DM) 6.25-15 MG/5ML syrup    Sig: Take 5 mLs by mouth 4 (four) times daily as needed for cough.    Dispense:  118 mL    Refill:  0  .  doxycycline (VIBRA-TABS) 100 MG tablet    Sig: Take 1 tablet (100 mg total) by mouth 2 (two) times daily.    Dispense:  20 tablet    Refill:  0  . predniSONE (DELTASONE) 20 MG tablet    Sig: 3 pills by mouth once daily for 3 days then 2 pills once daily for 3 days then 1 pill once daily for 3 days    Dispense:  18 tablet    Refill:  0          Relevant Medications   methylPREDNISolone acetate (DEPO-MEDROL) injection 80 mg (Completed)     Other   B12 deficiency    3rd weekly B12 shot today for low B12      Relevant Medications   cyanocobalamin ((VITAMIN B-12)) injection 1,000 mcg (Completed)   Smoker - Primary    No cig in 1 week due to illness She plans to quit now  F/u1 week- will disc further      Relevant Medications   methylPREDNISolone acetate  (DEPO-MEDROL) injection 80 mg (Completed)

## 2017-09-07 NOTE — Patient Instructions (Signed)
B12 shot today  Steroid shot today  Stop smoking as planned   Start antibiotic and cough medicines today  Start the prednisone tomorrow   Update if not improving   If worse get to the emergency room   Follow up in about a week

## 2017-09-07 NOTE — Assessment & Plan Note (Signed)
With h/o copd /smoking  Stopped smoking 1 wk ago -enc her not to start back  Significant wheezing on exam but good pulse ox  Depo medrol 80 IM times one today Then start pred 60 mg taper Tessalon and prometh DM for cough  Doxycycline bid for 10 d F/u 1 wk for re check Continue NMTS prn  If suddenly worse- get to ER/pt voices understanding  Meds ordered this encounter  Medications  . benzonatate (TESSALON) 200 MG capsule    Sig: Take 1 capsule (200 mg total) by mouth 3 (three) times daily as needed for cough. Do not bite pill    Dispense:  30 capsule    Refill:  1  . promethazine-dextromethorphan (PROMETHAZINE-DM) 6.25-15 MG/5ML syrup    Sig: Take 5 mLs by mouth 4 (four) times daily as needed for cough.    Dispense:  118 mL    Refill:  0  . doxycycline (VIBRA-TABS) 100 MG tablet    Sig: Take 1 tablet (100 mg total) by mouth 2 (two) times daily.    Dispense:  20 tablet    Refill:  0  . predniSONE (DELTASONE) 20 MG tablet    Sig: 3 pills by mouth once daily for 3 days then 2 pills once daily for 3 days then 1 pill once daily for 3 days    Dispense:  18 tablet    Refill:  0

## 2017-09-13 ENCOUNTER — Other Ambulatory Visit: Payer: Self-pay | Admitting: Orthopedic Surgery

## 2017-09-13 DIAGNOSIS — M5416 Radiculopathy, lumbar region: Secondary | ICD-10-CM

## 2017-09-14 ENCOUNTER — Ambulatory Visit (INDEPENDENT_AMBULATORY_CARE_PROVIDER_SITE_OTHER): Payer: Medicare HMO | Admitting: Family Medicine

## 2017-09-14 ENCOUNTER — Encounter: Payer: Self-pay | Admitting: Family Medicine

## 2017-09-14 VITALS — BP 122/88 | HR 78 | Temp 98.2°F | Ht 61.0 in | Wt 191.2 lb

## 2017-09-14 DIAGNOSIS — J209 Acute bronchitis, unspecified: Secondary | ICD-10-CM

## 2017-09-14 DIAGNOSIS — G8929 Other chronic pain: Secondary | ICD-10-CM | POA: Diagnosis not present

## 2017-09-14 DIAGNOSIS — N62 Hypertrophy of breast: Secondary | ICD-10-CM

## 2017-09-14 DIAGNOSIS — E538 Deficiency of other specified B group vitamins: Secondary | ICD-10-CM

## 2017-09-14 DIAGNOSIS — F17200 Nicotine dependence, unspecified, uncomplicated: Secondary | ICD-10-CM | POA: Diagnosis not present

## 2017-09-14 DIAGNOSIS — M546 Pain in thoracic spine: Secondary | ICD-10-CM

## 2017-09-14 DIAGNOSIS — R69 Illness, unspecified: Secondary | ICD-10-CM | POA: Diagnosis not present

## 2017-09-14 MED ORDER — CYANOCOBALAMIN 1000 MCG/ML IJ SOLN
1000.0000 ug | Freq: Once | INTRAMUSCULAR | Status: AC
  Administered 2017-09-14: 1000 ug via INTRAMUSCULAR

## 2017-09-14 MED ORDER — PREDNISONE 10 MG PO TABS: ORAL_TABLET | ORAL | 0 refills | Status: DC

## 2017-09-14 NOTE — Patient Instructions (Addendum)
B12 shot today   Schedule labs for B12 in a weeks   We will refer you to Dr Dessie Coma for consult regarding breast reduction   Use your neb treatments 3 times daily until wheezing improves Take the 30 mg prednisone taper  Update if not starting to improve in a week or if worsening    Keep wound on foot clean with soap and water- if red or angry looking let me know  Wear shoes - cover lightly to protect from dirt

## 2017-09-14 NOTE — Progress Notes (Signed)
Subjective:    Patient ID: Karina [name], female    DOB: 02/11/1956, 62 y.o.   MRN: [id number]  HPI Here for f/u of URI and also for B12 inj Interested in ref for breast reduction   Seen on 7/9 for uri with copd exacerbation  tx with depo medrol 80 mg im and then pred taper 60 mg  Tessalon and prometh DM Doxycycline 10 days  NMTs at home   Improving but not 100% A lot of post nasal drip -coughs it back up- yellow  More energy  Breathing is improved   Pulse ox is 94% Smoking status - mild   Getting B12 shots - 4 in 4 weeks for low level Lab Results  Component Value Date   VITAMINB12 180 (L) 08/11/2017  this is 4th shot    Ongoing shoulder and back pain with large breast size Neck/shoulder strain  Has to wear a bra all the time  42 DD bra size  Hard to exercise  Only 5 foot 1 inch- small stature   Had mammogram in June  Followed by [place] for cyst   Wt Readings from Last 3 Encounters:  09/14/17 191 lb 4 oz (86.8 kg)  09/07/17 190 lb (86.2 kg)  08/24/17 194 lb 8 oz (88.2 kg)  tries to loose weight- tough for her  36.14 kg/m   Had skin tear on foot from slip and fall with weed eater-last weekend  Some bleeding  Hurt toe  No longer hurting -bruised up  Is utd on Tdap   Patient Active Problem List   Diagnosis Date Noted  . Acute bronchitis with bronchospasm 09/07/2017  . B12 deficiency 08/24/2017  . Left low back pain 08/24/2017  . Asthma exacerbation in COPD ([place]) 03/31/2017  . Erosive osteoarthritis of both hands 09/09/2016  . Fatigue 09/07/2016  . Dysphagia 09/07/2016  . Bilateral hand pain 09/07/2016  . Rash and nonspecific skin eruption 05/18/2016  . Cough 05/18/2016  . Abdominal pain, diffuse 05/18/2016  . Breast cyst, left 04/30/2016  . Breast pain, right 04/30/2016  . Routine general medical examination at a health care facility 05/06/2015  . Large breasts 05/06/2015  . Lumbar disc disease 11/01/2014  . Knee pain, bilateral 03/11/2014  .  Thoracic back pain 11/30/2013  . Encounter for routine gynecological examination 01/31/2013  . Encounter for Medicare annual wellness exam 01/23/2013  . Chest pain 09/20/2012  . SOB (shortness of breath) 09/20/2012  . Dyspnea 09/13/2012  . Asthmatic bronchitis , chronic ([place]) 12/06/2011  . Chronic cough 11/22/2011  . Anxiety disorder 07/17/2009  . HEMORRHOIDS-EXTERNAL 11/07/2008  . IRRITABLE BOWEL SYNDROME 11/07/2008  . LUNG NODULE 10/14/2008  . HYPERGLYCEMIA, BORDERLINE 10/14/2008  . PERSONAL HX COLONIC POLYPS 10/08/2008  . Vitamin D deficiency 07/23/2008  . SYNCOPE, HX OF 06/01/2008  . FOOT SURGERY, HX OF 06/01/2008  . DILATION AND CURETTAGE, HX OF 06/01/2008  . OSTEOARTHRITIS, SHOULDER, RIGHT 03/22/2008  . ROTATOR CUFF SYNDROME, RIGHT 03/22/2008  . ARTHRALGIA 12/12/2007  . Allergic rhinitis 08/01/2007  . Smoker 10/17/2006  . Peripheral neuropathy 10/17/2006  . FOOT PAIN, BILATERAL 10/17/2006  . SYMPTOM, EDEMA 10/03/2006  . HYPOGLYCEMIA 09/30/2006  . HYPERCHOLESTEROLEMIA 09/30/2006  . MULTIPLE SCLEROSIS 09/30/2006  . GERD 09/30/2006  . FIBROCYSTIC BREAST DISEASE 09/30/2006  . SEIZURE DISORDER 09/30/2006  . URINARY INCONTINENCE 09/30/2006   Past Medical History:  Diagnosis Date  . Allergic rhinitis   . Allergy   . Anxiety    [facility] [place], no per pt  .  Cataract   . Colon polyps 08/2008   Adenomatous  . COPD (chronic obstructive pulmonary disease) ([place])   . Depression    no per pt  . Diabetes mellitus   . Gastritis 08/2008   H. pylori  . GERD (gastroesophageal reflux disease)   . History of shingles    Left V1  . Hypercholesteremia   . Lung nodule   . Menopausal disorder   . Multiple sclerosis ([place])   . Neuromuscular disorder ([place])    Multiple sclerosis  . Plantar fasciitis   . Seizure disorder ([place])    single seizure/hypoglycemia  . Seizures ([place])    1 seizure in 2001 due to blood sugar dropping to 38, none since  . Skin lesion 04/05/2017  .  Urinary incontinence   . Vertigo    Past Surgical History:  Procedure Laterality Date  . CERVICAL DISCECTOMY  2004   x 2, Anterior; Fusion C4-5, C5-6, C6-7, Dr. Kary Kos  . CHEST CT  11/2008   With small 4 mm nodule L lung base (rec re check in 1 year)  . CHEST CT  07/2009   Re check chest CT - lung nodule stable  . CHOLECYSTECTOMY    . COLONOSCOPY  08/2008   Polyps/ re check 5 yrs  . CT SINUS LTD W/O CM  02/2009   negative  . DILATION AND CURETTAGE OF UTERUS    . ESOPHAGOGASTRODUODENOSCOPY  08/2008   Erosive gastritis, h pylori (treated)  . FOOT SURGERY     left plantar fascial problem  . ROTATOR CUFF REPAIR     right  . TONSILLECTOMY    . TUBAL LIGATION    . UPPER GASTROINTESTINAL ENDOSCOPY     Social History   Tobacco Use  . Smoking status: Current Every Day Smoker    Years: 30.00    Types: Cigarettes  . Smokeless tobacco: Never Used  Substance Use Topics  . Alcohol use: No    Alcohol/week: 0.0 oz  . Drug use: No   Family History  Problem Relation Age of Onset  . Migraines Mother   . Heart attack Mother   . Coronary artery disease Mother   . Coronary artery disease Father        6 bypasses   . Stroke Father   . Diabetes Father   . Renal Disease Father   . Colon cancer Maternal Grandfather 3  . Coronary artery disease Cousin   . Coronary artery disease Paternal Grandmother   . Lung cancer Paternal Aunt   . Multiple sclerosis Neg Hx   . Esophageal cancer Neg Hx   . Rectal cancer Neg Hx   . Stomach cancer Neg Hx   . Breast cancer Neg Hx    Allergies  Allergen Reactions  . Percocet [Oxycodone-Acetaminophen] Shortness Of Breath    Felt like going to pass out and sweating  . Tecfidera [Dimethyl Fumarate] Shortness Of Breath, Palpitations, Rash and Cough  . Amitriptyline Hcl     REACTION: swelling and itching  . Atorvastatin     REACTION: elevated LFT's  . Betaseron [Interferon Beta-1b]     Headache  . Clarithromycin     REACTION: reaction not  known  . Duloxetine     REACTION: pain  . Levofloxacin     REACTION: Rash  . Pregabalin     REACTION: LE swelling  . Pseudoephedrine     REACTION: legs hurt  . Zoloft [Sertraline Hcl] Other (See Comments)    Headaches  Current Outpatient Medications on File Prior to Visit  Medication Sig Dispense Refill  . albuterol (VENTOLIN HFA) 108 (90 Base) MCG/ACT inhaler INHALE 2 PUFFS UP TO EVERY 4 HOURS AS NEEDED FOR WHEEZING 3 Inhaler 3  . ALPRAZolam (XANAX) 0.5 MG tablet Take 1 tablet (0.5 mg total) by mouth 2 (two) times daily as needed. For anxiety. 30 tablet 0  . azelastine (ASTELIN) 0.1 % nasal spray Place 2 sprays into both nostrils 2 (two) times daily as needed.  12  . benzonatate (TESSALON) 200 MG capsule Take 1 capsule (200 mg total) by mouth 3 (three) times daily as needed for cough. Do not bite pill 30 capsule 1  . Blood Glucose Monitoring Suppl (ACCU-CHEK AVIVA PLUS) w/Device KIT Check blood sugar once daily and as directed Dx R73.09 1 kit 0  . budesonide-formoterol (SYMBICORT) 160-4.5 MCG/ACT inhaler Inhale 2 puffs into the lungs 2 (two) times daily. 3 Inhaler 2  . CRESTOR 20 MG tablet Take 1 tablet (20 mg total) by mouth daily. (Patient taking differently: Take 10 mg by mouth daily. ) 90 tablet 3  . cyanocobalamin (,VITAMIN B-12,) 1000 MCG/ML injection Inject 1,000 mcg into the muscle once. Pt to receive injection once weekly for 4weeks    . Diclofenac Sodium 3 % GEL Place 1 application onto the skin every 12 (twelve) hours as needed. 50 g 0  . doxycycline (VIBRA-TABS) 100 MG tablet Take 1 tablet (100 mg total) by mouth 2 (two) times daily. 20 tablet 0  . gabapentin (NEURONTIN) 300 MG capsule One capsule twice during the day and 2 at night 360 capsule 3  . ipratropium-albuterol (DUONEB) 0.5-2.5 (3) MG/3ML SOLN Take 3 mLs by nebulization every 6 (six) hours as needed. 360 mL 5  . pantoprazole (PROTONIX) 40 MG tablet Take 1 tablet (40 mg total) by mouth 2 (two) times daily before a  meal. 60 tablet 2  . predniSONE (DELTASONE) 20 MG tablet 3 pills by mouth once daily for 3 days then 2 pills once daily for 3 days then 1 pill once daily for 3 days 18 tablet 0  . Probiotic Product (ALIGN PO) Take by mouth daily.    . promethazine-dextromethorphan (PROMETHAZINE-DM) 6.25-15 MG/5ML syrup Take 5 mLs by mouth 4 (four) times daily as needed for cough. 118 mL 0   No current facility-administered medications on file prior to visit.     Review of Systems  Constitutional: Positive for fatigue. Negative for activity change, appetite change, fever and unexpected weight change.  HENT: Positive for congestion and postnasal drip. Negative for ear pain, rhinorrhea, sinus pressure and sore throat.   Eyes: Negative for pain, redness and visual disturbance.  Respiratory: Positive for cough and wheezing. Negative for shortness of breath.   Cardiovascular: Negative for chest pain and palpitations.  Gastrointestinal: Negative for abdominal pain, blood in stool, constipation and diarrhea.  Endocrine: Negative for polydipsia and polyuria.  Genitourinary: Negative for dysuria, frequency and urgency.  Musculoskeletal: Positive for arthralgias, back pain and neck pain. Negative for myalgias.  Skin: Positive for wound. Negative for pallor and rash.  Allergic/Immunologic: Negative for environmental allergies.  Neurological: Negative for dizziness, syncope and headaches.  Hematological: Negative for adenopathy. Does not bruise/bleed easily.  Psychiatric/Behavioral: Negative for decreased concentration and dysphoric mood. The patient is not nervous/anxious.        Objective:   Physical Exam  Constitutional: She appears well-developed and well-nourished. No distress.  obese and well appearing   HENT:  Head: Normocephalic and atraumatic.  Right Ear: External ear normal.  Left Ear: External ear normal.  Mouth/Throat: Oropharynx is clear and moist.  Nares are injected and congested    Eyes: Pupils  are equal, round, and reactive to light. Conjunctivae and EOM are normal. Right eye exhibits no discharge. Left eye exhibits no discharge. No scleral icterus.  Neck: Normal range of motion. Neck supple. No JVD present. Carotid bruit is not present. No thyromegaly present.  Cardiovascular: Normal rate, regular rhythm, normal heart sounds and intact distal pulses. Exam reveals no gallop.  Pulmonary/Chest: Effort normal. No respiratory distress. She has wheezes. She has no rales.  No crackles Diffusely distant bs  Exp wheezing= improved from last visit  No prolonged exp phase  No rales  Abdominal: Soft. Bowel sounds are normal. She exhibits no distension, no abdominal bruit and no mass. There is no tenderness.  Genitourinary:  Genitourinary Comments: Large breast size    Musculoskeletal: She exhibits no edema.  Tender TS and trap musculature  Divots in shoulders from bra straps  Lymphadenopathy:    She has no cervical adenopathy.  Neurological: She is alert. She has normal reflexes. She displays normal reflexes. No cranial nerve deficit. Coordination normal.  Skin: Skin is warm and dry. No rash noted. No erythema. No pallor.  Skin tear L foot- superficial/medial  Clean  No erythema  Mild bruising  Psychiatric: She has a normal mood and affect.          Assessment & Plan:   Problem List Items Addressed This Visit      Respiratory   Acute bronchitis with bronchospasm - Primary    Improving but with residual wheezing  Will extend prednisone taper  Update if not starting to improve in a week or if worsening    Enc smoking cessatoin         Other   B12 deficiency    4th weekly B12 shot today  Re check level 1 week      Relevant Medications   cyanocobalamin ((VITAMIN B-12)) injection 1,000 mcg (Completed)   Large breasts    Pt is interested in ref to plastic surgery to consider breast red surgery  Has chronic neck and back pain and shoulder divots       Relevant  Orders   Ambulatory referral to Plastic Surgery   Smoker    Disc in detail risks of smoking and possible outcomes including copd, vascular/ heart disease, cancer , respiratory and sinus infections  Pt voices understanding  She is not ready to quit today      Thoracic back pain    Pt suspects rel to her breast size  Interested in red sx Ref to plastic surgery      Relevant Medications   predniSONE (DELTASONE) 10 MG tablet   Other Relevant Orders   Ambulatory referral to Plastic Surgery

## 2017-09-17 NOTE — Assessment & Plan Note (Signed)
Pt is interested in ref to plastic surgery to consider breast red surgery  Has chronic neck and back pain and shoulder divots

## 2017-09-17 NOTE — Assessment & Plan Note (Addendum)
Improving but with residual wheezing  Will extend prednisone taper  Update if not starting to improve in a week or if worsening    Enc smoking cessatoin

## 2017-09-17 NOTE — Assessment & Plan Note (Signed)
4th weekly B12 shot today  Re check level 1 week

## 2017-09-17 NOTE — Assessment & Plan Note (Signed)
Pt suspects rel to her breast size  Interested in red sx Ref to plastic surgery

## 2017-09-17 NOTE — Assessment & Plan Note (Signed)
Disc in detail risks of smoking and possible outcomes including copd, vascular/ heart disease, cancer , respiratory and sinus infections  Pt voices understanding  She is not ready to quit today

## 2017-09-18 ENCOUNTER — Ambulatory Visit
Admission: RE | Admit: 2017-09-18 | Discharge: 2017-09-18 | Disposition: A | Discharge status: Home / Self Care | Payer: Medicare HMO | Source: Ambulatory Visit | Attending: Orthopedic Surgery | Admitting: Orthopedic Surgery

## 2017-09-18 DIAGNOSIS — M5416 Radiculopathy, lumbar region: Secondary | ICD-10-CM

## 2017-09-18 DIAGNOSIS — M545 Low back pain: Secondary | ICD-10-CM | POA: Diagnosis not present

## 2017-09-20 DIAGNOSIS — M5416 Radiculopathy, lumbar region: Secondary | ICD-10-CM | POA: Diagnosis not present

## 2017-09-22 ENCOUNTER — Other Ambulatory Visit (INDEPENDENT_AMBULATORY_CARE_PROVIDER_SITE_OTHER): Payer: Medicare HMO

## 2017-09-22 DIAGNOSIS — E538 Deficiency of other specified B group vitamins: Secondary | ICD-10-CM | POA: Diagnosis not present

## 2017-09-22 LAB — VITAMIN B12: Vitamin B-12: 843 pg/mL (ref 211–911)

## 2017-10-03 DIAGNOSIS — N62 Hypertrophy of breast: Secondary | ICD-10-CM | POA: Diagnosis not present

## 2017-10-13 DIAGNOSIS — G35 Multiple sclerosis: Secondary | ICD-10-CM | POA: Diagnosis not present

## 2017-10-13 DIAGNOSIS — G2581 Restless legs syndrome: Secondary | ICD-10-CM | POA: Diagnosis not present

## 2017-10-13 DIAGNOSIS — J449 Chronic obstructive pulmonary disease, unspecified: Secondary | ICD-10-CM | POA: Diagnosis not present

## 2017-10-13 DIAGNOSIS — G629 Polyneuropathy, unspecified: Secondary | ICD-10-CM | POA: Diagnosis not present

## 2017-10-13 DIAGNOSIS — E559 Vitamin D deficiency, unspecified: Secondary | ICD-10-CM | POA: Diagnosis not present

## 2017-10-13 DIAGNOSIS — E785 Hyperlipidemia, unspecified: Secondary | ICD-10-CM | POA: Diagnosis not present

## 2017-10-13 DIAGNOSIS — E539 Vitamin B deficiency, unspecified: Secondary | ICD-10-CM | POA: Diagnosis not present

## 2017-10-13 DIAGNOSIS — R69 Illness, unspecified: Secondary | ICD-10-CM | POA: Diagnosis not present

## 2017-11-15 ENCOUNTER — Ambulatory Visit: Payer: Medicare HMO | Admitting: Adult Health

## 2017-11-15 ENCOUNTER — Ambulatory Visit: Payer: Medicare HMO | Admitting: *Deleted

## 2017-11-15 ENCOUNTER — Telehealth: Payer: Self-pay | Admitting: Adult Health

## 2017-11-15 ENCOUNTER — Other Ambulatory Visit: Payer: Self-pay

## 2017-11-15 ENCOUNTER — Encounter: Payer: Self-pay | Admitting: Adult Health

## 2017-11-15 VITALS — BP 133/80 | HR 69 | Resp 18 | Ht 61.0 in | Wt 187.5 lb

## 2017-11-15 DIAGNOSIS — M542 Cervicalgia: Secondary | ICD-10-CM | POA: Diagnosis not present

## 2017-11-15 DIAGNOSIS — R51 Headache: Secondary | ICD-10-CM

## 2017-11-15 DIAGNOSIS — G35 Multiple sclerosis: Secondary | ICD-10-CM

## 2017-11-15 DIAGNOSIS — G4486 Cervicogenic headache: Secondary | ICD-10-CM

## 2017-11-15 NOTE — Addendum Note (Signed)
Addended by: Trudie Buckler on: 11/15/2017 03:33 PM   Modules accepted: Orders

## 2017-11-15 NOTE — Progress Notes (Signed)
PATIENT: Karina [name] DOB: 12/19/1955  REASON FOR VISIT: follow up HISTORY FROM: patient  HISTORY OF PRESENT ILLNESS: Today 11/15/17:  Ms. Karina [name] is a 62 year old female with a history of multiple sclerosis and neck pain.  She returns today for follow-up.  In regards to multiple sclerosis she denies any new symptoms.  She denies any weakness.  No change in the bowel or bladder.  No change in the vision.  She reports that she does have on and off blurry vision but this has been ongoing.  She denies any changes with her gait or balance.  She reports on occasion she has issues with word finding.  She states that she can be in mid conversation and cannot think of the word that she wants to say however later on it may come to her.  She states that she has had a bad headache for the last 2 weeks however that has improved.  Her headaches seem to occur in the neck and occipital region.  At the last visit she was referred to neuromuscular therapy but never got called to schedule this appointment.  She does report that her whole body hurts.  She states for that reason she often has an irritable mood.  She returns today for evaluation.  HISTORY Ms. Karina [name] is a 62 year old right-handed white female with a history of multiple sclerosis and mild diabetes.  The patient is not on any disease modifying agents, she has had problems in the past lying flat for the MRI scan, the MRI of the brain was ordered on her last visit but she claims that no one called to schedule the appointment.  The patient also has had prior neck issues, she continues to have neck stiffness and discomfort, she has pain coming up in the back of the head, pressure sensation and burning.  The patient also reports burning in the feet and some difficulty controlling the bladder and bowels, and episodes of diarrhea.  The patient has some left hand pain.  She does report some blurring of vision off and on, she has not had any falls.  She denies any  significant gait instability.  She returns to this office for an evaluation.   REVIEW OF SYSTEMS: Out of a complete 14 system review of symptoms, the patient complains only of the following symptoms, and all other reviewed systems are negative.  See HPI  ALLERGIES: Allergies  Allergen Reactions  . Percocet [Oxycodone-Acetaminophen] Shortness Of Breath    Felt like going to pass out and sweating  . Tecfidera [Dimethyl Fumarate] Shortness Of Breath, Palpitations, Rash and Cough  . Amitriptyline Hcl     REACTION: swelling and itching  . Atorvastatin     REACTION: elevated LFT's  . Betaseron [Interferon Beta-1b]     Headache  . Clarithromycin     REACTION: reaction not known  . Duloxetine     REACTION: pain  . Levofloxacin     REACTION: Rash  . Pregabalin     REACTION: LE swelling  . Pseudoephedrine     REACTION: legs hurt  . Zoloft [Sertraline Hcl] Other (See Comments)    Headaches     HOME MEDICATIONS: Outpatient Medications Prior to Visit  Medication Sig Dispense Refill  . albuterol (VENTOLIN HFA) 108 (90 Base) MCG/ACT inhaler INHALE 2 PUFFS UP TO EVERY 4 HOURS AS NEEDED FOR WHEEZING 3 Inhaler 3  . ALPRAZolam (XANAX) 0.5 MG tablet Take 1 tablet (0.5 mg total) by mouth 2 (two) times daily  as needed. For anxiety. 30 tablet 0  . azelastine (ASTELIN) 0.1 % nasal spray Place 2 sprays into both nostrils 2 (two) times daily as needed.  12  . Blood Glucose Monitoring Suppl (ACCU-CHEK AVIVA PLUS) w/Device KIT Check blood sugar once daily and as directed Dx R73.09 1 kit 0  . budesonide-formoterol (SYMBICORT) 160-4.5 MCG/ACT inhaler Inhale 2 puffs into the lungs 2 (two) times daily. 3 Inhaler 2  . CRESTOR 20 MG tablet Take 1 tablet (20 mg total) by mouth daily. (Patient taking differently: Take 10 mg by mouth daily. ) 90 tablet 3  . Diclofenac Sodium 3 % GEL Place 1 application onto the skin every 12 (twelve) hours as needed. 50 g 0  . gabapentin (NEURONTIN) 300 MG capsule One  capsule twice during the day and 2 at night 360 capsule 3  . ipratropium-albuterol (DUONEB) 0.5-2.5 (3) MG/3ML SOLN Take 3 mLs by nebulization every 6 (six) hours as needed. 360 mL 5  . pantoprazole (PROTONIX) 40 MG tablet Take 1 tablet (40 mg total) by mouth 2 (two) times daily before a meal. 60 tablet 2  . Probiotic Product (ALIGN PO) Take by mouth daily.    . vitamin B-12 (CYANOCOBALAMIN) 1000 MCG tablet Take 1,000 mcg by mouth daily.    . benzonatate (TESSALON) 200 MG capsule Take 1 capsule (200 mg total) by mouth 3 (three) times daily as needed for cough. Do not bite pill 30 capsule 1  . cyanocobalamin (,VITAMIN B-12,) 1000 MCG/ML injection Inject 1,000 mcg into the muscle once. Pt to receive injection once weekly for 4weeks    . doxycycline (VIBRA-TABS) 100 MG tablet Take 1 tablet (100 mg total) by mouth 2 (two) times daily. 20 tablet 0  . predniSONE (DELTASONE) 10 MG tablet Take 3 pills once daily by mouth for 3 days, then 2 pills once daily for 3 days, then 1 pill once daily for 3 days and then stop 18 tablet 0  . predniSONE (DELTASONE) 20 MG tablet 3 pills by mouth once daily for 3 days then 2 pills once daily for 3 days then 1 pill once daily for 3 days 18 tablet 0  . promethazine-dextromethorphan (PROMETHAZINE-DM) 6.25-15 MG/5ML syrup Take 5 mLs by mouth 4 (four) times daily as needed for cough. 118 mL 0   No facility-administered medications prior to visit.     PAST MEDICAL HISTORY: Past Medical History:  Diagnosis Date  . Allergic rhinitis   . Allergy   . Anxiety    [facility]- [facility]ock[facility], no per pt  . Cataract   . Colon polyps 08/2008   Adenomatous  . COPD (chronic obstructive pulmonary disease) ([place])   . Depression    no per pt  . Diabetes mellitus   . Gastritis 08/2008   H. pylori  . GERD (gastroesophageal reflux disease)   . History of shingles    Left V1  . Hypercholesteremia   . Lung nodule   . Menopausal disorder   . Multiple sclerosis ([place])   . Neuromuscular  disorder ([place])    Multiple sclerosis  . Plantar fasciitis   . Seizure disorder ([place])    single seizure/hypoglycemia  . Seizures ([place])    1 seizure in 2001 due to blood sugar dropping to 38, none since  . Skin lesion 04/05/2017  . Urinary incontinence   . Vertigo     PAST SURGICAL HISTORY: Past Surgical History:  Procedure Laterality Date  . CERVICAL DISCECTOMY  2004   x 2, Anterior; Fusion  C4-5, C5-6, C6-7, Dr. Kary Kos  . CHEST CT  11/2008   With small 4 mm nodule L lung base (rec re check in 1 year)  . CHEST CT  07/2009   Re check chest CT - lung nodule stable  . CHOLECYSTECTOMY    . COLONOSCOPY  08/2008   Polyps/ re check 5 yrs  . CT SINUS LTD W/O CM  02/2009   negative  . DILATION AND CURETTAGE OF UTERUS    . ESOPHAGOGASTRODUODENOSCOPY  08/2008   Erosive gastritis, h pylori (treated)  . FOOT SURGERY     left plantar fascial problem  . ROTATOR CUFF REPAIR     right  . TONSILLECTOMY    . TUBAL LIGATION    . UPPER GASTROINTESTINAL ENDOSCOPY      FAMILY HISTORY: Family History  Problem Relation Age of Onset  . Migraines Mother   . Heart attack Mother   . Coronary artery disease Mother   . Coronary artery disease Father        6 bypasses   . Stroke Father   . Diabetes Father   . Renal Disease Father   . Colon cancer Maternal Grandfather 69  . Coronary artery disease Cousin   . Coronary artery disease Paternal Grandmother   . Lung cancer Paternal Aunt   . Multiple sclerosis Neg Hx   . Esophageal cancer Neg Hx   . Rectal cancer Neg Hx   . Stomach cancer Neg Hx   . Breast cancer Neg Hx     SOCIAL HISTORY: Social History   Socioeconomic History  . Marital status: Married    Spouse name: Not on file  . Number of children: 3  . Years of education: 9 th  . Highest education level: Not on file  Occupational History  . Occupation: Disabled     Employer: DISABLED  Social Needs  . Financial resource strain: Not on file  . Food insecurity:    Worry: Not  on file    Inability: Not on file  . Transportation needs:    Medical: Not on file    Non-medical: Not on file  Tobacco Use  . Smoking status: Current Every Day Smoker    Years: 30.00    Types: Cigarettes  . Smokeless tobacco: Never Used  Substance and Sexual Activity  . Alcohol use: No    Alcohol/week: 0.0 standard drinks  . Drug use: No  . Sexual activity: Not on file  Lifestyle  . Physical activity:    Days per week: Not on file    Minutes per session: Not on file  . Stress: Not on file  Relationships  . Social connections:    Talks on phone: Not on file    Gets together: Not on file    Attends religious service: Not on file    Active member of club or organization: Not on file    Attends meetings of clubs or organizations: Not on file    Relationship status: Not on file  . Intimate partner violence:    Fear of current or ex partner: Not on file    Emotionally abused: Not on file    Physically abused: Not on file    Forced sexual activity: Not on file  Other Topics Concern  . Not on file  Social History Narrative   Married with 3 children   Daily caffeine use - 2    Disabled.   Education 9th grade    Caffeine one cup  of coffee daily.   Patient is right handed.       PHYSICAL EXAM  Vitals:   11/15/17 1303  BP: 133/80  Pulse: 69  Resp: 18  Weight: 187 lb 8 oz (85 kg)  Height: '5\' 1"'$  (1.549 m)   Body mass index is 35.43 kg/m.  Generalized: Well developed, in no acute distress   Neurological examination  Mentation: Alert oriented to time, place, history taking. Follows all commands speech and language fluent Cranial nerve II-XII: Pupils were equal round reactive to light. Extraocular movements were full, visual field were full on confrontational test. Facial sensation and strength were normal. Uvula tongue midline. Head turning and shoulder shrug  were normal and symmetric. Motor: The motor testing reveals 5 over 5 strength of all 4 extremities. Good  symmetric motor tone is noted throughout.  Sensory: Sensory testing is intact to soft touch on all 4 extremities. No evidence of extinction is noted.  Coordination: Cerebellar testing reveals good finger-nose-finger and heel-to-shin bilaterally.  Gait and station: Gait is normal. Tandem gait is normal. Romberg is negative. No drift is seen.  Reflexes: Deep tendon reflexes are symmetric and normal bilaterally.   DIAGNOSTIC DATA (LABS, IMAGING, TESTING) - I reviewed patient records, labs, notes, testing and imaging myself where available.  Lab Results  Component Value Date   WBC 7.4 08/11/2017   HGB 15.1 (H) 08/11/2017   HCT 44.8 08/11/2017   MCV 96.0 08/11/2017   PLT 204.0 08/11/2017      Component Value Date/Time   NA 137 08/11/2017 1644   NA 141 11/21/2014 1137   NA 136 07/09/2013 1803   K 4.5 08/11/2017 1644   K 4.2 07/09/2013 1803   CL 100 08/11/2017 1644   CL 105 07/09/2013 1803   CO2 28 08/11/2017 1644   CO2 22 07/09/2013 1803   GLUCOSE 79 08/11/2017 1644   GLUCOSE 96 07/09/2013 1803   BUN 10 08/11/2017 1644   BUN 11 11/21/2014 1137   BUN 16 07/09/2013 1803   CREATININE 0.98 08/11/2017 1644   CREATININE 1.13 07/09/2013 1803   CALCIUM 10.0 08/11/2017 1644   CALCIUM 9.6 07/09/2013 1803   PROT 7.6 08/11/2017 1644   PROT 6.9 11/21/2014 1137   ALBUMIN 4.4 08/11/2017 1644   ALBUMIN 4.3 11/21/2014 1137   AST 20 08/11/2017 1644   ALT 14 08/11/2017 1644   ALKPHOS 70 08/11/2017 1644   BILITOT 0.4 08/11/2017 1644   BILITOT 0.3 11/21/2014 1137   GFRNONAA >60 03/31/2017 0111   GFRNONAA 55 (L) 07/09/2013 1803   GFRAA >60 03/31/2017 0111   GFRAA >60 07/09/2013 1803   Lab Results  Component Value Date   CHOL 266 (H) 09/01/2016   HDL 62.20 09/01/2016   LDLCALC 178 (H) 09/01/2016   LDLDIRECT 206.5 01/24/2013   TRIG 130.0 09/01/2016   CHOLHDL 4 09/01/2016   Lab Results  Component Value Date   HGBA1C 5.9 09/01/2016   Lab Results  Component Value Date   BJYNWGNF62  130 09/22/2017   Lab Results  Component Value Date   TSH 1.64 08/11/2017      ASSESSMENT AND PLAN 62 y.o. year old female  has a past medical history of Allergic rhinitis, Allergy, Anxiety, Cataract, Colon polyps (08/2008), COPD (chronic obstructive pulmonary disease) ([place]), Depression, Diabetes mellitus, Gastritis (08/2008), GERD (gastroesophageal reflux disease), History of shingles, Hypercholesteremia, Lung nodule, Menopausal disorder, Multiple sclerosis ([place]), Neuromuscular disorder ([place]), Plantar fasciitis, Seizure disorder ([place]), Seizures ([place]), Skin lesion (04/05/2017), Urinary incontinence, and Vertigo. here  with:  1.  Multiple sclerosis 2.  Cervicogenic headaches  Overall the patient has remained stable in regards to multiple sclerosis.  She continues to have cervicogenic headaches.  She will continue on gabapentin.  We will get her set up with neuromuscular therapy.  She is advised that if her symptoms worsen or she develops new symptoms she should let us know.  She will follow-up in 6 months or sooner if needed.   Ward Givens, MSN, NP-C 11/15/2017, 1:04 PM [facility] [address] [place], [place] [address] [phone]

## 2017-11-15 NOTE — Telephone Encounter (Signed)
Karina [name] ,  Please re- order patient is going this Friday .  Physical therapy referral RE: neck pain and cervicogenic headache, request neuromuscular therapy.  Thanks [employer].

## 2017-11-15 NOTE — Patient Instructions (Signed)
Your Plan:  Continue Gabapentin Will get scheduled with neuromuscular therapy If your symptoms worsen or you develop new symptoms please let us know.    Thank you for coming to see [place] at [facility]. I hope we have been able to provide you high quality care today.  You may receive a patient satisfaction survey over the next few weeks. We would appreciate your feedback and comments so that we may continue to improve ourselves and the health of our patients.

## 2017-11-15 NOTE — Progress Notes (Signed)
I have read the note, and I agree with the clinical assessment and plan.  Jearldine Cassady K Jhanae Jaskowiak   

## 2017-11-18 ENCOUNTER — Ambulatory Visit: Payer: Medicare HMO | Admitting: Physical Therapy

## 2017-12-15 ENCOUNTER — Ambulatory Visit (INDEPENDENT_AMBULATORY_CARE_PROVIDER_SITE_OTHER): Payer: Medicare HMO | Admitting: Family Medicine

## 2017-12-15 ENCOUNTER — Encounter: Payer: Self-pay | Admitting: Family Medicine

## 2017-12-15 VITALS — BP 110/72 | HR 78 | Temp 97.9°F | Ht 61.0 in | Wt 185.2 lb

## 2017-12-15 DIAGNOSIS — Z23 Encounter for immunization: Secondary | ICD-10-CM

## 2017-12-15 DIAGNOSIS — L820 Inflamed seborrheic keratosis: Secondary | ICD-10-CM | POA: Insufficient documentation

## 2017-12-15 NOTE — Progress Notes (Signed)
Subjective:    Patient ID: Karina [name], female    DOB: October 08, 1955, 62 y.o.   MRN: [id number]  HPI Here to check a dark mole on her back   It hurt and burned a bit -her husband took a pic to show her  2 weeks  No insect bites or sun burns   No hx of skin cancer   Also wants a flu shot   Patient Active Problem List   Diagnosis Date Noted  . Seborrheic keratosis, inflamed 12/15/2017  . Acute bronchitis with bronchospasm 09/07/2017  . B12 deficiency 08/24/2017  . Left low back pain 08/24/2017  . Asthma exacerbation in COPD ([place]) 03/31/2017  . Erosive osteoarthritis of both hands 09/09/2016  . Fatigue 09/07/2016  . Dysphagia 09/07/2016  . Bilateral hand pain 09/07/2016  . Rash and nonspecific skin eruption 05/18/2016  . Cough 05/18/2016  . Abdominal pain, diffuse 05/18/2016  . Breast cyst, left 04/30/2016  . Breast pain, right 04/30/2016  . Routine general medical examination at a health care facility 05/06/2015  . Large breasts 05/06/2015  . Lumbar disc disease 11/01/2014  . Knee pain, bilateral 03/11/2014  . Thoracic back pain 11/30/2013  . Encounter for routine gynecological examination 01/31/2013  . Encounter for Medicare annual wellness exam 01/23/2013  . Chest pain 09/20/2012  . SOB (shortness of breath) 09/20/2012  . Dyspnea 09/13/2012  . Asthmatic bronchitis , chronic ([place]) 12/06/2011  . Chronic cough 11/22/2011  . Anxiety disorder 07/17/2009  . HEMORRHOIDS-EXTERNAL 11/07/2008  . IRRITABLE BOWEL SYNDROME 11/07/2008  . LUNG NODULE 10/14/2008  . HYPERGLYCEMIA, BORDERLINE 10/14/2008  . PERSONAL HX COLONIC POLYPS 10/08/2008  . Vitamin D deficiency 07/23/2008  . SYNCOPE, HX OF 06/01/2008  . FOOT SURGERY, HX OF 06/01/2008  . DILATION AND CURETTAGE, HX OF 06/01/2008  . OSTEOARTHRITIS, SHOULDER, RIGHT 03/22/2008  . ROTATOR CUFF SYNDROME, RIGHT 03/22/2008  . ARTHRALGIA 12/12/2007  . Allergic rhinitis 08/01/2007  . Smoker 10/17/2006  . Peripheral neuropathy  10/17/2006  . FOOT PAIN, BILATERAL 10/17/2006  . SYMPTOM, EDEMA 10/03/2006  . HYPOGLYCEMIA 09/30/2006  . HYPERCHOLESTEROLEMIA 09/30/2006  . MULTIPLE SCLEROSIS 09/30/2006  . GERD 09/30/2006  . FIBROCYSTIC BREAST DISEASE 09/30/2006  . URINARY INCONTINENCE 09/30/2006   Past Medical History:  Diagnosis Date  . Allergic rhinitis   . Allergy   . Anxiety    [facility]- Perv[facility]ock[place], no per pt  . Cataract   . Colon polyps 08/2008   Adenomatous  . COPD (chronic obstructive pulmonary disease) ([place])   . Depression    no per pt  . Diabetes mellitus   . Gastritis 08/2008   H. pylori  . GERD (gastroesophageal reflux disease)   . History of shingles    Left V1  . Hypercholesteremia   . Lung nodule   . Menopausal disorder   . Multiple sclerosis ([place])   . Neuromuscular disorder ([place])    Multiple sclerosis  . Plantar fasciitis   . Seizure disorder ([place])    single seizure/hypoglycemia  . Seizures ([place])    1 seizure in 2001 due to blood sugar dropping to 38, none since  . Skin lesion 04/05/2017  . Urinary incontinence   . Vertigo    Past Surgical History:  Procedure Laterality Date  . CERVICAL DISCECTOMY  2004   x 2, Anterior; Fusion C4-5, C5-6, C6-7, Dr. Kary Kos  . CHEST CT  11/2008   With small 4 mm nodule L lung base (rec re check in 1 year)  . CHEST  CT  07/2009   Re check chest CT - lung nodule stable  . CHOLECYSTECTOMY    . COLONOSCOPY  08/2008   Polyps/ re check 5 yrs  . CT SINUS LTD W/O CM  02/2009   negative  . DILATION AND CURETTAGE OF UTERUS    . ESOPHAGOGASTRODUODENOSCOPY  08/2008   Erosive gastritis, h pylori (treated)  . FOOT SURGERY     left plantar fascial problem  . ROTATOR CUFF REPAIR     right  . TONSILLECTOMY    . TUBAL LIGATION    . UPPER GASTROINTESTINAL ENDOSCOPY     Social History   Tobacco Use  . Smoking status: Current Every Day Smoker    Years: 30.00    Types: Cigarettes  . Smokeless tobacco: Never Used  Substance Use Topics  . Alcohol  use: No    Alcohol/week: 0.0 standard drinks  . Drug use: No   Family History  Problem Relation Age of Onset  . Migraines Mother   . Heart attack Mother   . Coronary artery disease Mother   . Coronary artery disease Father        6 bypasses   . Stroke Father   . Diabetes Father   . Renal Disease Father   . Colon cancer Maternal Grandfather 89  . Coronary artery disease Cousin   . Coronary artery disease Paternal Grandmother   . Lung cancer Paternal Aunt   . Multiple sclerosis Neg Hx   . Esophageal cancer Neg Hx   . Rectal cancer Neg Hx   . Stomach cancer Neg Hx   . Breast cancer Neg Hx    Allergies  Allergen Reactions  . Percocet [Oxycodone-Acetaminophen] Shortness Of Breath    Felt like going to pass out and sweating  . Tecfidera [Dimethyl Fumarate] Shortness Of Breath, Palpitations, Rash and Cough  . Amitriptyline Hcl     REACTION: swelling and itching  . Atorvastatin     REACTION: elevated LFT's  . Betaseron [Interferon Beta-1b]     Headache  . Clarithromycin     REACTION: reaction not known  . Duloxetine     REACTION: pain  . Levofloxacin     REACTION: Rash  . Pregabalin     REACTION: LE swelling  . Pseudoephedrine     REACTION: legs hurt  . Zoloft [Sertraline Hcl] Other (See Comments)    Headaches    Current Outpatient Medications on File Prior to Visit  Medication Sig Dispense Refill  . albuterol (VENTOLIN HFA) 108 (90 Base) MCG/ACT inhaler INHALE 2 PUFFS UP TO EVERY 4 HOURS AS NEEDED FOR WHEEZING 3 Inhaler 3  . ALPRAZolam (XANAX) 0.5 MG tablet Take 1 tablet (0.5 mg total) by mouth 2 (two) times daily as needed. For anxiety. 30 tablet 0  . azelastine (ASTELIN) 0.1 % nasal spray Place 2 sprays into both nostrils 2 (two) times daily as needed.  12  . Blood Glucose Monitoring Suppl (ACCU-CHEK AVIVA PLUS) w/Device KIT Check blood sugar once daily and as directed Dx R73.09 1 kit 0  . budesonide-formoterol (SYMBICORT) 160-4.5 MCG/ACT inhaler Inhale 2 puffs into  the lungs 2 (two) times daily. 3 Inhaler 2  . CRESTOR 20 MG tablet Take 1 tablet (20 mg total) by mouth daily. (Patient taking differently: Take 10 mg by mouth daily. ) 90 tablet 3  . Diclofenac Sodium 3 % GEL Place 1 application onto the skin every 12 (twelve) hours as needed. 50 g 0  . gabapentin (NEURONTIN) 300 MG  capsule One capsule twice during the day and 2 at night 360 capsule 3  . ipratropium-albuterol (DUONEB) 0.5-2.5 (3) MG/3ML SOLN Take 3 mLs by nebulization every 6 (six) hours as needed. 360 mL 5  . pantoprazole (PROTONIX) 40 MG tablet Take 1 tablet (40 mg total) by mouth 2 (two) times daily before a meal. 60 tablet 2  . Probiotic Product (ALIGN PO) Take by mouth daily.    . vitamin B-12 (CYANOCOBALAMIN) 1000 MCG tablet Take 1,000 mcg by mouth daily.     No current facility-administered medications on file prior to visit.     Review of Systems  Constitutional: Positive for fatigue. Negative for chills and fever.  Eyes: Negative for redness and visual disturbance.  Respiratory: Negative for cough and shortness of breath.   Musculoskeletal: Positive for arthralgias and back pain.       Some pain around R shoulder blade since her shoulder replacement surgery  No swollen joints   Neurological: Positive for weakness and headaches. Negative for tremors and syncope.       Baseline intermittent weakness from MS  Hematological: Negative for adenopathy. Does not bruise/bleed easily.       Objective:   Physical Exam  Constitutional: She appears well-developed and well-nourished. No distress.  obese and well appearing   HENT:  Head: Normocephalic and atraumatic.  Mouth/Throat: Oropharynx is clear and moist.  Eyes: Pupils are equal, round, and reactive to light. Conjunctivae and EOM are normal.  Neck: Normal range of motion. Neck supple.  Cardiovascular: Normal rate, regular rhythm and normal heart sounds.  Pulmonary/Chest: Effort normal and breath sounds normal. She has no wheezes.    Diffusely distant bs   Musculoskeletal: She exhibits no edema.  Limited rom R shoulder  Lymphadenopathy:    She has no cervical adenopathy.  Neurological: A sensory deficit is present.  Skin: Skin is warm and dry. No rash noted. No erythema. No pallor.  Tanned  Solar lentigines diffusely (with sun damage)   4-5 mm brown sk on R upper back with inflamed appearance (raised and waxy)  Smaller 3 mm tan sk on L neck     Psychiatric: She has a normal mood and affect.          Assessment & Plan:   Problem List Items Addressed This Visit      Musculoskeletal and Integument   Seborrheic keratosis, inflamed - Primary    Right upper back (unsure if pt rubbed or scratched it)  Cleaned and tx with freeze spray (cryo) =times 2 with full freeze and thaw  Disc site care- keep clean with soap and water until healing  Expect area to fall off then but cannot guarantee it will not grow back  inst to call if questions or concerns Disc imp of sun protection to prevent skin cancer         Other Visit Diagnoses    Need for influenza vaccination       Relevant Orders   Flu Vaccine QUAD 6+ mos PF IM (Fluarix Quad PF) (Completed)

## 2017-12-15 NOTE — Assessment & Plan Note (Signed)
Right upper back (unsure if pt rubbed or scratched it)  Cleaned and tx with freeze spray (cryo) =times 2 with full freeze and thaw  Disc site care- keep clean with soap and water until healing  Expect area to fall off then but cannot guarantee it will not grow back  inst to call if questions or concerns Disc imp of sun protection to prevent skin cancer

## 2017-12-15 NOTE — Patient Instructions (Signed)
The mole is a seborrheic keratosis (you have one on your back and a smaller one on left neck)  I used some freezing spray - so they should blister and then hopefully fall off after they heal Keep areas clean with soap and water   These are common   Flu shot today

## 2018-01-05 DIAGNOSIS — L821 Other seborrheic keratosis: Secondary | ICD-10-CM | POA: Diagnosis not present

## 2018-01-05 DIAGNOSIS — L280 Lichen simplex chronicus: Secondary | ICD-10-CM | POA: Diagnosis not present

## 2018-03-03 ENCOUNTER — Telehealth: Payer: Self-pay

## 2018-03-03 NOTE — Telephone Encounter (Signed)
Agree with UC We are full here  Thanks

## 2018-03-03 NOTE — Telephone Encounter (Signed)
Pt calls having dizziness on and off but today the dizziness is continuous; pt has lower abd pain when eats anything; no gas; for the last two days pt has had loose stools.pt having burning upon urination. No energy and just feels bad. Pt having pressure feeling in head on and off. No fever, no CP now (but did have CP few days ago), pt has had some SOB. Pt will have her husband take her to [facility] in [place]. Pt will cb if needed.FYI to Dr Glori Bickers.

## 2018-03-28 DIAGNOSIS — J309 Allergic rhinitis, unspecified: Secondary | ICD-10-CM | POA: Diagnosis not present

## 2018-03-28 DIAGNOSIS — J449 Chronic obstructive pulmonary disease, unspecified: Secondary | ICD-10-CM | POA: Diagnosis not present

## 2018-03-28 DIAGNOSIS — R69 Illness, unspecified: Secondary | ICD-10-CM | POA: Diagnosis not present

## 2018-03-28 DIAGNOSIS — G35 Multiple sclerosis: Secondary | ICD-10-CM | POA: Diagnosis not present

## 2018-03-28 DIAGNOSIS — G629 Polyneuropathy, unspecified: Secondary | ICD-10-CM | POA: Diagnosis not present

## 2018-03-28 DIAGNOSIS — K219 Gastro-esophageal reflux disease without esophagitis: Secondary | ICD-10-CM | POA: Diagnosis not present

## 2018-03-28 DIAGNOSIS — G8929 Other chronic pain: Secondary | ICD-10-CM | POA: Diagnosis not present

## 2018-03-28 DIAGNOSIS — Z008 Encounter for other general examination: Secondary | ICD-10-CM | POA: Diagnosis not present

## 2018-03-28 DIAGNOSIS — E785 Hyperlipidemia, unspecified: Secondary | ICD-10-CM | POA: Diagnosis not present

## 2018-03-28 DIAGNOSIS — E669 Obesity, unspecified: Secondary | ICD-10-CM | POA: Diagnosis not present

## 2018-03-28 DIAGNOSIS — M1990 Unspecified osteoarthritis, unspecified site: Secondary | ICD-10-CM | POA: Diagnosis not present

## 2018-04-13 ENCOUNTER — Encounter: Payer: Self-pay | Admitting: Family Medicine

## 2018-04-13 ENCOUNTER — Ambulatory Visit (INDEPENDENT_AMBULATORY_CARE_PROVIDER_SITE_OTHER): Payer: Medicare HMO | Admitting: Family Medicine

## 2018-04-13 VITALS — BP 123/85 | HR 80 | Temp 98.0°F | Ht 61.0 in

## 2018-04-13 DIAGNOSIS — N390 Urinary tract infection, site not specified: Secondary | ICD-10-CM | POA: Insufficient documentation

## 2018-04-13 DIAGNOSIS — N3000 Acute cystitis without hematuria: Secondary | ICD-10-CM | POA: Diagnosis not present

## 2018-04-13 DIAGNOSIS — G35 Multiple sclerosis: Secondary | ICD-10-CM

## 2018-04-13 DIAGNOSIS — J449 Chronic obstructive pulmonary disease, unspecified: Secondary | ICD-10-CM

## 2018-04-13 DIAGNOSIS — R300 Dysuria: Secondary | ICD-10-CM | POA: Diagnosis not present

## 2018-04-13 LAB — POC URINALSYSI DIPSTICK (AUTOMATED)
Bilirubin, UA: NEGATIVE
Blood, UA: NEGATIVE
Glucose, UA: NEGATIVE
Ketones, UA: NEGATIVE
Nitrite, UA: NEGATIVE
Protein, UA: NEGATIVE
Spec Grav, UA: 1.02 (ref 1.010–1.025)
Urobilinogen, UA: 0.2 E.U./dL
pH, UA: 6 (ref 5.0–8.0)

## 2018-04-13 MED ORDER — SULFAMETHOXAZOLE-TRIMETHOPRIM 800-160 MG PO TABS: 1.0000 | ORAL_TABLET | Freq: Two times a day (BID) | ORAL | 0 refills | Status: DC

## 2018-04-13 NOTE — Assessment & Plan Note (Signed)
No clinical changes Continues to smoke

## 2018-04-13 NOTE — Assessment & Plan Note (Addendum)
UA with small leukocytes and symptoms of uti  Cover with bactrim  Enc to inc fluids  Avoid artificial sweeteners -may worsen dysuria  Urine cx pending

## 2018-04-13 NOTE — Progress Notes (Signed)
Subjective:    Patient ID: Karina [name], female    DOB: 1955/07/06, 63 y.o.   MRN: [id number]  HPI Here with urinary symptoms   Wt Readings from Last 3 Encounters:  12/15/17 185 lb 4 oz (84 kg)  11/15/17 187 lb 8 oz (85 kg)  09/14/17 191 lb 4 oz (86.8 kg)   35.00 kg/m   Started a month ago and getting worse  No blood in urine  No fever  No n/v but decreased appetite  Frequent urination with urgency  At end of urination- burning/pain  Some dizzy spells and feeling poorly   Drinking lots of water   L ear hurts- since yesterday  Stuffy nose - used saline  Some hoarseness but throat is not sore  Some cough (chronic)   Smoking - down to 1ppd + No change in breathing Chronic smoker's cough  No active wheezing Not ready to quit    Some dizziness on and off  Nasal congestion  MS is overall stable No headache today     UA  Small leukocytes  Results for orders placed or performed in visit on 04/13/18  POCT Urinalysis Dipstick (Automated)  Result Value Ref Range   Color, UA Yellow    Clarity, UA Clear    Glucose, UA Negative Negative   Bilirubin, UA Negative    Ketones, UA Negative    Spec Grav, UA 1.020 1.010 - 1.025   Blood, UA Negative    pH, UA 6.0 5.0 - 8.0   Protein, UA Negative Negative   Urobilinogen, UA 0.2 0.2 or 1.0 E.U./dL   Nitrite, UA Negative    Leukocytes, UA Small (1+) (A) Negative      Patient Active Problem List   Diagnosis Date Noted  . UTI (urinary tract infection) 04/13/2018  . Seborrheic keratosis, inflamed 12/15/2017  . B12 deficiency 08/24/2017  . Left low back pain 08/24/2017  . Erosive osteoarthritis of both hands 09/09/2016  . Fatigue 09/07/2016  . Bilateral hand pain 09/07/2016  . Rash and nonspecific skin eruption 05/18/2016  . Cough 05/18/2016  . Abdominal pain, diffuse 05/18/2016  . Breast cyst, left 04/30/2016  . Breast pain, right 04/30/2016  . Routine general medical examination at a health care facility  05/06/2015  . Large breasts 05/06/2015  . Lumbar disc disease 11/01/2014  . Knee pain, bilateral 03/11/2014  . Thoracic back pain 11/30/2013  . Encounter for routine gynecological examination 01/31/2013  . Encounter for Medicare annual wellness exam 01/23/2013  . Chest pain 09/20/2012  . SOB (shortness of breath) 09/20/2012  . Dyspnea 09/13/2012  . Asthmatic bronchitis , chronic ([place]) 12/06/2011  . Chronic cough 11/22/2011  . Anxiety disorder 07/17/2009  . HEMORRHOIDS-EXTERNAL 11/07/2008  . IRRITABLE BOWEL SYNDROME 11/07/2008  . LUNG NODULE 10/14/2008  . Prediabetes 10/14/2008  . PERSONAL HX COLONIC POLYPS 10/08/2008  . Vitamin D deficiency 07/23/2008  . SYNCOPE, HX OF 06/01/2008  . FOOT SURGERY, HX OF 06/01/2008  . DILATION AND CURETTAGE, HX OF 06/01/2008  . OSTEOARTHRITIS, SHOULDER, RIGHT 03/22/2008  . ROTATOR CUFF SYNDROME, RIGHT 03/22/2008  . ARTHRALGIA 12/12/2007  . Allergic rhinitis 08/01/2007  . Smoker 10/17/2006  . Peripheral neuropathy 10/17/2006  . FOOT PAIN, BILATERAL 10/17/2006  . SYMPTOM, EDEMA 10/03/2006  . HYPOGLYCEMIA 09/30/2006  . HYPERCHOLESTEROLEMIA 09/30/2006  . MULTIPLE SCLEROSIS 09/30/2006  . GERD 09/30/2006  . FIBROCYSTIC BREAST DISEASE 09/30/2006  . URINARY INCONTINENCE 09/30/2006   Past Medical History:  Diagnosis Date  . Allergic rhinitis   .  Allergy   . Anxiety    [facility] [place], no per pt  . Cataract   . Colon polyps 08/2008   Adenomatous  . COPD (chronic obstructive pulmonary disease) ([place])   . Depression    no per pt  . Diabetes mellitus   . Gastritis 08/2008   H. pylori  . GERD (gastroesophageal reflux disease)   . History of shingles    Left V1  . Hypercholesteremia   . Lung nodule   . Menopausal disorder   . Multiple sclerosis ([place])   . Neuromuscular disorder ([place])    Multiple sclerosis  . Plantar fasciitis   . Seizure disorder ([place])    single seizure/hypoglycemia  . Seizures ([place])    1 seizure in 2001 due to  blood sugar dropping to 38, none since  . Skin lesion 04/05/2017  . Urinary incontinence   . Vertigo    Past Surgical History:  Procedure Laterality Date  . CERVICAL DISCECTOMY  2004   x 2, Anterior; Fusion C4-5, C5-6, C6-7, Dr. Kary Kos  . CHEST CT  11/2008   With small 4 mm nodule L lung base (rec re check in 1 year)  . CHEST CT  07/2009   Re check chest CT - lung nodule stable  . CHOLECYSTECTOMY    . COLONOSCOPY  08/2008   Polyps/ re check 5 yrs  . CT SINUS LTD W/O CM  02/2009   negative  . DILATION AND CURETTAGE OF UTERUS    . ESOPHAGOGASTRODUODENOSCOPY  08/2008   Erosive gastritis, h pylori (treated)  . FOOT SURGERY     left plantar fascial problem  . ROTATOR CUFF REPAIR     right  . TONSILLECTOMY    . TUBAL LIGATION    . UPPER GASTROINTESTINAL ENDOSCOPY     Social History   Tobacco Use  . Smoking status: Current Every Day Smoker    Years: 30.00    Types: Cigarettes  . Smokeless tobacco: Never Used  Substance Use Topics  . Alcohol use: No    Alcohol/week: 0.0 standard drinks  . Drug use: No   Family History  Problem Relation Age of Onset  . Migraines Mother   . Heart attack Mother   . Coronary artery disease Mother   . Coronary artery disease Father        6 bypasses   . Stroke Father   . Diabetes Father   . Renal Disease Father   . Colon cancer Maternal Grandfather 85  . Coronary artery disease Cousin   . Coronary artery disease Paternal Grandmother   . Lung cancer Paternal Aunt   . Multiple sclerosis Neg Hx   . Esophageal cancer Neg Hx   . Rectal cancer Neg Hx   . Stomach cancer Neg Hx   . Breast cancer Neg Hx    Allergies  Allergen Reactions  . Percocet [Oxycodone-Acetaminophen] Shortness Of Breath    Felt like going to pass out and sweating  . Tecfidera [Dimethyl Fumarate] Shortness Of Breath, Palpitations, Rash and Cough  . Amitriptyline Hcl     REACTION: swelling and itching  . Atorvastatin     REACTION: elevated LFT's  . Betaseron  [Interferon Beta-1b]     Headache  . Clarithromycin     REACTION: reaction not known  . Duloxetine     REACTION: pain  . Levofloxacin     REACTION: Rash  . Pregabalin     REACTION: LE swelling  . Pseudoephedrine  REACTION: legs hurt  . Zoloft [Sertraline Hcl] Other (See Comments)    Headaches    Current Outpatient Medications on File Prior to Visit  Medication Sig Dispense Refill  . albuterol (VENTOLIN HFA) 108 (90 Base) MCG/ACT inhaler INHALE 2 PUFFS UP TO EVERY 4 HOURS AS NEEDED FOR WHEEZING 3 Inhaler 3  . ALPRAZolam (XANAX) 0.5 MG tablet Take 1 tablet (0.5 mg total) by mouth 2 (two) times daily as needed. For anxiety. 30 tablet 0  . azelastine (ASTELIN) 0.1 % nasal spray Place 2 sprays into both nostrils 2 (two) times daily as needed.  12  . Blood Glucose Monitoring Suppl (ACCU-CHEK AVIVA PLUS) w/Device KIT Check blood sugar once daily and as directed Dx R73.09 1 kit 0  . budesonide-formoterol (SYMBICORT) 160-4.5 MCG/ACT inhaler Inhale 2 puffs into the lungs 2 (two) times daily. 3 Inhaler 2  . CRESTOR 20 MG tablet Take 1 tablet (20 mg total) by mouth daily. (Patient taking differently: Take 10 mg by mouth daily. ) 90 tablet 3  . Diclofenac Sodium 3 % GEL Place 1 application onto the skin every 12 (twelve) hours as needed. 50 g 0  . gabapentin (NEURONTIN) 300 MG capsule One capsule twice during the day and 2 at night 360 capsule 3  . ipratropium-albuterol (DUONEB) 0.5-2.5 (3) MG/3ML SOLN Take 3 mLs by nebulization every 6 (six) hours as needed. 360 mL 5  . pantoprazole (PROTONIX) 40 MG tablet Take 1 tablet (40 mg total) by mouth 2 (two) times daily before a meal. 60 tablet 2  . Probiotic Product (ALIGN PO) Take by mouth daily.    . vitamin B-12 (CYANOCOBALAMIN) 1000 MCG tablet Take 1,000 mcg by mouth daily.     No current facility-administered medications on file prior to visit.     Review of Systems  Constitutional: Positive for fatigue. Negative for activity change, appetite  change and fever.  HENT: Negative for congestion and sore throat.   Eyes: Negative for itching and visual disturbance.  Respiratory: Negative for cough and shortness of breath.   Cardiovascular: Negative for leg swelling.  Gastrointestinal: Negative for abdominal distention, abdominal pain, constipation, diarrhea and nausea.  Endocrine: Negative for cold intolerance and polydipsia.  Genitourinary: Positive for dysuria, frequency and urgency. Negative for difficulty urinating, flank pain, hematuria and pelvic pain.  Musculoskeletal: Negative for myalgias.  Skin: Negative for rash.  Allergic/Immunologic: Negative for immunocompromised state.  Neurological: Positive for dizziness. Negative for weakness and numbness.  Hematological: Negative for adenopathy.       Objective:   Physical Exam Constitutional:      General: She is not in acute distress.    Appearance: Normal appearance. She is well-developed. She is obese. She is not ill-appearing.  HENT:     Head: Normocephalic and atraumatic.     Right Ear: Tympanic membrane and ear canal normal.     Left Ear: Ear canal normal.     Nose: Congestion present.     Mouth/Throat:     Mouth: Mucous membranes are moist.     Pharynx: Oropharynx is clear.  Eyes:     Conjunctiva/sclera: Conjunctivae normal.     Pupils: Pupils are equal, round, and reactive to light.  Neck:     Musculoskeletal: Normal range of motion and neck supple.  Cardiovascular:     Rate and Rhythm: Normal rate and regular rhythm.     Heart sounds: Normal heart sounds.  Pulmonary:     Effort: Pulmonary effort is normal. No respiratory distress.  Breath sounds: Normal breath sounds. No wheezing.     Comments: Diffusely distant bs  Abdominal:     General: Bowel sounds are normal. There is no distension.     Palpations: Abdomen is soft. There is no mass.     Tenderness: There is abdominal tenderness. There is no rebound.     Comments: No cva tenderness  Mild  suprapubic tenderness  Lymphadenopathy:     Cervical: No cervical adenopathy.  Skin:    Findings: No rash.  Neurological:     Mental Status: She is alert. Mental status is at baseline.     Cranial Nerves: No cranial nerve deficit.     Coordination: Coordination normal.     Deep Tendon Reflexes: Reflexes normal.  Psychiatric:        Mood and Affect: Mood normal.           Assessment & Plan:   Problem List Items Addressed This Visit      Respiratory   Asthmatic bronchitis , chronic (H[facility])    No clinical changes Continues to smoke        Nervous and Auditory   MULTIPLE SCLEROSIS    Sees neurology Per pt more dizzy lately  Otherwise stable         Genitourinary   UTI (urinary tract infection) - Primary    UA with small leukocytes and symptoms of uti  Cover with bactrim  Enc to inc fluids  Avoid artificial sweeteners -may worsen dysuria  Urine cx pending       Relevant Medications   sulfamethoxazole-trimethoprim (BACTRIM DS,SEPTRA DS) 800-160 MG tablet   Other Relevant Orders   Urine Culture    Other Visit Diagnoses    Dysuria       Relevant Orders   POCT Urinalysis Dipstick (Automated) (Completed)

## 2018-04-13 NOTE — Assessment & Plan Note (Signed)
Sees neurology Per pt more dizzy lately  Otherwise stable

## 2018-04-13 NOTE — Patient Instructions (Addendum)
Drink lots of fluid Take the bactrim as directed We will culture urine and call you when it returns   Avoid artificial sweeteners if it hurts to urinate  Keep thinking about quitting smoking

## 2018-04-15 LAB — URINE CULTURE
MICRO NUMBER:: 161197
SPECIMEN QUALITY:: ADEQUATE

## 2018-05-05 ENCOUNTER — Ambulatory Visit (INDEPENDENT_AMBULATORY_CARE_PROVIDER_SITE_OTHER)
Admission: RE | Admit: 2018-05-05 | Discharge: 2018-05-05 | Disposition: A | Discharge status: Home / Self Care | Payer: Medicare HMO | Source: Ambulatory Visit | Attending: Family Medicine | Admitting: Family Medicine

## 2018-05-05 ENCOUNTER — Ambulatory Visit (INDEPENDENT_AMBULATORY_CARE_PROVIDER_SITE_OTHER): Payer: Medicare HMO | Admitting: Family Medicine

## 2018-05-05 ENCOUNTER — Encounter: Payer: Self-pay | Admitting: Family Medicine

## 2018-05-05 VITALS — BP 134/92 | HR 81 | Temp 98.0°F | Ht 61.0 in | Wt 177.2 lb

## 2018-05-05 DIAGNOSIS — R69 Illness, unspecified: Secondary | ICD-10-CM | POA: Diagnosis not present

## 2018-05-05 DIAGNOSIS — M545 Low back pain, unspecified: Secondary | ICD-10-CM

## 2018-05-05 DIAGNOSIS — R05 Cough: Secondary | ICD-10-CM

## 2018-05-05 DIAGNOSIS — N62 Hypertrophy of breast: Secondary | ICD-10-CM | POA: Diagnosis not present

## 2018-05-05 DIAGNOSIS — G8929 Other chronic pain: Secondary | ICD-10-CM

## 2018-05-05 DIAGNOSIS — F17200 Nicotine dependence, unspecified, uncomplicated: Secondary | ICD-10-CM

## 2018-05-05 DIAGNOSIS — R0781 Pleurodynia: Secondary | ICD-10-CM | POA: Insufficient documentation

## 2018-05-05 DIAGNOSIS — M546 Pain in thoracic spine: Secondary | ICD-10-CM | POA: Diagnosis not present

## 2018-05-05 DIAGNOSIS — R059 Cough, unspecified: Secondary | ICD-10-CM

## 2018-05-05 LAB — POC URINALSYSI DIPSTICK (AUTOMATED)
Bilirubin, UA: NEGATIVE
Blood, UA: NEGATIVE
Glucose, UA: NEGATIVE
Ketones, UA: NEGATIVE
Leukocytes, UA: NEGATIVE
Nitrite, UA: NEGATIVE
Protein, UA: NEGATIVE
Spec Grav, UA: 1.02 (ref 1.010–1.025)
Urobilinogen, UA: 0.2 E.U./dL
pH, UA: 5.5 (ref 5.0–8.0)

## 2018-05-05 MED ORDER — METHOCARBAMOL 500 MG PO TABS: 500.0000 mg | ORAL_TABLET | Freq: Three times a day (TID) | ORAL | 1 refills | Status: DC | PRN

## 2018-05-05 NOTE — Patient Instructions (Addendum)
Let's do a chest xray today (you are tender in the ribs)  advil is ok for pain but take it with food Or the diclofenac gel on affected areas (not both at the same time)   Urine is totally clear   If no improvement- still check in with [facility] for back pain   Stretch when you can  Use some heat - 10 minutes at a time   Let's try a muscle relaxer -methocarbamol Watch for sedation with this /use with caution   We will contract you with a chest xray reading

## 2018-05-05 NOTE — Progress Notes (Signed)
Subjective:    Patient ID: Karina [name], female    DOB: Mar 30, 1955, 63 y.o.   MRN: [id number]  HPI  Here for c/o lower back pain (points to her cva area)  Started on the L then went to the R  Was seen on 2/6 for cystitis  Covered with bactrim  Urine grew out klebsiella that was sensitive to it   Urine is now clear  Results for orders placed or performed in visit on 05/05/18  POCT Urinalysis Dipstick (Automated)  Result Value Ref Range   Color, UA Yellow    Clarity, UA Clear    Glucose, UA Negative Negative   Bilirubin, UA Negative    Ketones, UA Negative    Spec Grav, UA 1.020 1.010 - 1.025   Blood, UA Negative    pH, UA 5.5 5.0 - 8.0   Protein, UA Negative Negative   Urobilinogen, UA 0.2 0.2 or 1.0 E.U./dL   Nitrite, UA Negative    Leukocytes, UA Negative Negative     She has hx of LS disc dz in the past  Has seen Dr Patrice Paradise in the past  Was seen at [facility] in the summer    Last LS MRI 7/19 MR LUMBAR SPINE [place] (Accession [id number]) (Order [id number])  Imaging  Date: 09/18/2017 Department: [facility] [place] Released By: Sharman Cheek Authorizing: Carlynn Spry, PA-C  Exam Information   Status Exam Begun  Exam Ended   Final [99] 09/18/2017 12:22 PM 09/18/2017 12:43 PM  PACS Images   Show images for MR LUMBAR SPINE WO CONTRAST  Study Result   CLINICAL DATA:  Low back pain and bilateral hip pain over the last 7 years.  EXAM: MRI LUMBAR SPINE WITHOUT CONTRAST  TECHNIQUE: Multiplanar, multisequence MR imaging of the lumbar spine was performed. No intravenous contrast was administered.  COMPARISON:  06/06/2009  FINDINGS: Segmentation:  5 lumbar type vertebral bodies.  Alignment:  Normal  Vertebrae:  No fracture or primary bone lesion.  Conus medullaris and cauda equina: Conus extends to the L1 level. Conus and cauda equina appear normal.  Paraspinal and other soft tissues: Negative  Disc  levels:  No abnormality at T12-L1 or L1-2.  L2-3: Minimal disc bulge.  No stenosis.  L3-4: Minimal disc bulge.  No stenosis.  L4-5: Minimal disc bulge. Mild facet osteoarthritis without edema. No stenosis.  L5-S1: No disc pathology.  Minimal facet hypertrophy.  No stenosis.  No change since 2011.  IMPRESSION: No change since 2011. Mild lumbar region degenerative changes with mild non-compressive disc bulges and mild lower lumbar facet osteoarthritis without edema or slippage. This degree of findings is commonly seen in asymptomatic patients at this age. No definite cause of the presenting symptoms is identified.   Electronically Signed   By: Nelson Chimes M.D.   On: 09/18/2017 15:11   Pain is burning and sharp  Moving makes it worse  Worse position - standing to walk  Cannot get comfortable in bed - rolls from one side to the other   Pain does not shoot to buttocks or legs  No new numb or weak spots   One day she had the chills  Temp is normal now   Last TS film  DG Thoracic Spine W/Swimmers (Accession [id number]) (Order [id number])  Imaging  Date: 11/30/2013 Department: [facility] Released By: Rich Reining Authorizing: Pauline Trainer, Wynelle Fanny, MD  Exam Information   Status Exam [place]  Exam  Ended   Final [99] 11/30/2013 3:21 PM 11/30/2013 5:15 PM  PACS Images   Show images for DG Thoracic Spine W/Swimmers  Study Result   CLINICAL DATA:  LEFT thoracic pain since moving heavy furniture  EXAM: THORACIC SPINE - 2 VIEW + SWIMMERS  COMPARISON:  Chest radiographs 11/19/2011  FINDINGS: Prior cervical and cervicothoracic fusions.  Twelve pairs of ribs.  Vertebral body and disc space heights maintained.  No acute fracture, subluxation or bone destruction.  Visualized posterior ribs intact.  IMPRESSION: No acute thoracic spine abnormalities.   Electronically Signed   By: Lavonia Dana M.D.   On: 11/30/2013  17:22   She alternates between constipation and diarrhea  No blood   Abdomen is tender but does not hurt unless you press on it   Also cough - acute on chronic Foamy phlegm (like glue)  Sticky -but not green   No wheezing   Forehead is burning Kerrin Champagne /both sides (no rash)  Patient Active Problem List   Diagnosis Date Noted  . Rib pain 05/05/2018  . Seborrheic keratosis, inflamed 12/15/2017  . B12 deficiency 08/24/2017  . Left low back pain 08/24/2017  . Erosive osteoarthritis of both hands 09/09/2016  . Fatigue 09/07/2016  . Bilateral hand pain 09/07/2016  . Rash and nonspecific skin eruption 05/18/2016  . Cough 05/18/2016  . Routine general medical examination at a health care facility 05/06/2015  . Large breasts 05/06/2015  . Lumbar disc disease 11/01/2014  . Knee pain, bilateral 03/11/2014  . Thoracic back pain 11/30/2013  . Encounter for routine gynecological examination 01/31/2013  . Encounter for Medicare annual wellness exam 01/23/2013  . Chest pain 09/20/2012  . SOB (shortness of breath) 09/20/2012  . Dyspnea 09/13/2012  . Asthmatic bronchitis , chronic ([place]) 12/06/2011  . Chronic cough 11/22/2011  . Anxiety disorder 07/17/2009  . HEMORRHOIDS-EXTERNAL 11/07/2008  . IRRITABLE BOWEL SYNDROME 11/07/2008  . LUNG NODULE 10/14/2008  . Prediabetes 10/14/2008  . PERSONAL HX COLONIC POLYPS 10/08/2008  . Vitamin D deficiency 07/23/2008  . SYNCOPE, HX OF 06/01/2008  . FOOT SURGERY, HX OF 06/01/2008  . DILATION AND CURETTAGE, HX OF 06/01/2008  . OSTEOARTHRITIS, SHOULDER, RIGHT 03/22/2008  . ROTATOR CUFF SYNDROME, RIGHT 03/22/2008  . ARTHRALGIA 12/12/2007  . Allergic rhinitis 08/01/2007  . Smoker 10/17/2006  . Peripheral neuropathy 10/17/2006  . FOOT PAIN, BILATERAL 10/17/2006  . HYPOGLYCEMIA 09/30/2006  . HYPERCHOLESTEROLEMIA 09/30/2006  . MULTIPLE SCLEROSIS 09/30/2006  . GERD 09/30/2006  . FIBROCYSTIC BREAST DISEASE 09/30/2006   Past Medical History:   Diagnosis Date  . Allergic rhinitis   . Allergy   . Anxiety    [place] [place], no per pt  . Cataract   . Colon polyps 08/2008   Adenomatous  . COPD (chronic obstructive pulmonary disease) ([place])   . Depression    no per pt  . Diabetes mellitus   . Gastritis 08/2008   H. pylori  . GERD (gastroesophageal reflux disease)   . History of shingles    Left V1  . Hypercholesteremia   . Lung nodule   . Menopausal disorder   . Multiple sclerosis ([place])   . Neuromuscular disorder ([place])    Multiple sclerosis  . Plantar fasciitis   . Seizure disorder ([place])    single seizure/hypoglycemia  . Seizures ([place])    1 seizure in 2001 due to blood sugar dropping to 38, none since  . Skin lesion 04/05/2017  . Urinary incontinence   . Vertigo    Past  Surgical History:  Procedure Laterality Date  . CERVICAL DISCECTOMY  2004   x 2, Anterior; Fusion C4-5, C5-6, C6-7, Dr. Kary Kos  . CHEST CT  11/2008   With small 4 mm nodule L lung base (rec re check in 1 year)  . CHEST CT  07/2009   Re check chest CT - lung nodule stable  . CHOLECYSTECTOMY    . COLONOSCOPY  08/2008   Polyps/ re check 5 yrs  . CT SINUS LTD W/O CM  02/2009   negative  . DILATION AND CURETTAGE OF UTERUS    . ESOPHAGOGASTRODUODENOSCOPY  08/2008   Erosive gastritis, h pylori (treated)  . FOOT SURGERY     left plantar fascial problem  . ROTATOR CUFF REPAIR     right  . TONSILLECTOMY    . TUBAL LIGATION    . UPPER GASTROINTESTINAL ENDOSCOPY     Social History   Tobacco Use  . Smoking status: Current Every Day Smoker    Years: 30.00    Types: Cigarettes  . Smokeless tobacco: Never Used  Substance Use Topics  . Alcohol use: No    Alcohol/week: 0.0 standard drinks  . Drug use: No   Family History  Problem Relation Age of Onset  . Migraines Mother   . Heart attack Mother   . Coronary artery disease Mother   . Coronary artery disease Father        6 bypasses   . Stroke Father   . Diabetes Father   .  Renal Disease Father   . Colon cancer Maternal Grandfather 40  . Coronary artery disease Cousin   . Coronary artery disease Paternal Grandmother   . Lung cancer Paternal Aunt   . Multiple sclerosis Neg Hx   . Esophageal cancer Neg Hx   . Rectal cancer Neg Hx   . Stomach cancer Neg Hx   . Breast cancer Neg Hx    Allergies  Allergen Reactions  . Percocet [Oxycodone-Acetaminophen] Shortness Of Breath    Felt like going to pass out and sweating  . Tecfidera [Dimethyl Fumarate] Shortness Of Breath, Palpitations, Rash and Cough  . Amitriptyline Hcl     REACTION: swelling and itching  . Atorvastatin     REACTION: elevated LFT's  . Betaseron [Interferon Beta-1b]     Headache  . Clarithromycin     REACTION: reaction not known  . Duloxetine     REACTION: pain  . Levofloxacin     REACTION: Rash  . Pregabalin     REACTION: LE swelling  . Pseudoephedrine     REACTION: legs hurt  . Zoloft [Sertraline Hcl] Other (See Comments)    Headaches    Current Outpatient Medications on File Prior to Visit  Medication Sig Dispense Refill  . albuterol (VENTOLIN HFA) 108 (90 Base) MCG/ACT inhaler INHALE 2 PUFFS UP TO EVERY 4 HOURS AS NEEDED FOR WHEEZING 3 Inhaler 3  . ALPRAZolam (XANAX) 0.5 MG tablet Take 1 tablet (0.5 mg total) by mouth 2 (two) times daily as needed. For anxiety. 30 tablet 0  . azelastine (ASTELIN) 0.1 % nasal spray Place 2 sprays into both nostrils 2 (two) times daily as needed.  12  . Blood Glucose Monitoring Suppl (ACCU-CHEK AVIVA PLUS) w/Device KIT Check blood sugar once daily and as directed Dx R73.09 1 kit 0  . budesonide-formoterol (SYMBICORT) 160-4.5 MCG/ACT inhaler Inhale 2 puffs into the lungs 2 (two) times daily. 3 Inhaler 2  . CRESTOR 20 MG tablet Take 1  tablet (20 mg total) by mouth daily. (Patient taking differently: Take 10 mg by mouth daily. ) 90 tablet 3  . Diclofenac Sodium 3 % GEL Place 1 application onto the skin every 12 (twelve) hours as needed. 50 g 0  .  gabapentin (NEURONTIN) 300 MG capsule One capsule twice during the day and 2 at night 360 capsule 3  . ipratropium-albuterol (DUONEB) 0.5-2.5 (3) MG/3ML SOLN Take 3 mLs by nebulization every 6 (six) hours as needed. 360 mL 5  . pantoprazole (PROTONIX) 40 MG tablet Take 1 tablet (40 mg total) by mouth 2 (two) times daily before a meal. 60 tablet 2  . Probiotic Product (ALIGN PO) Take by mouth daily.    . vitamin B-12 (CYANOCOBALAMIN) 1000 MCG tablet Take 1,000 mcg by mouth daily.     No current facility-administered medications on file prior to visit.     Review of Systems  Constitutional: Positive for fatigue. Negative for activity change, appetite change, fever and unexpected weight change.  HENT: Negative for congestion, ear pain, rhinorrhea, sinus pressure and sore throat.   Eyes: Negative for pain, redness and visual disturbance.  Respiratory: Positive for cough and shortness of breath. Negative for wheezing.        Baseline cough and sob  Cardiovascular: Negative for chest pain, palpitations and leg swelling.  Gastrointestinal: Negative for abdominal pain, blood in stool, constipation and diarrhea.  Endocrine: Negative for polydipsia and polyuria.  Genitourinary: Negative for dysuria, frequency and urgency.  Musculoskeletal: Positive for arthralgias, back pain and myalgias. Negative for joint swelling.       Middle back pain -over lower ribs with tenderness  Skin: Negative for pallor and rash.       Feels a burning over entire forehead/ no rash  Has old scab on R  Allergic/Immunologic: Negative for environmental allergies.  Neurological: Negative for dizziness, syncope and headaches.  Hematological: Negative for adenopathy. Does not bruise/bleed easily.  Psychiatric/Behavioral: Negative for decreased concentration and dysphoric mood. The patient is nervous/anxious.        Objective:   Physical Exam Constitutional:      General: She is not in acute distress.    Appearance:  Normal appearance. She is obese. She is not ill-appearing.  HENT:     Head: Normocephalic and atraumatic.     Mouth/Throat:     Mouth: Mucous membranes are moist.     Pharynx: Oropharynx is clear. No posterior oropharyngeal erythema.  Eyes:     General: No scleral icterus.    Extraocular Movements: Extraocular movements intact.     Conjunctiva/sclera: Conjunctivae normal.     Pupils: Pupils are equal, round, and reactive to light.  Neck:     Musculoskeletal: No neck rigidity.  Cardiovascular:     Rate and Rhythm: Normal rate and regular rhythm.     Pulses: Normal pulses.  Pulmonary:     Effort: Pulmonary effort is normal. No respiratory distress.     Breath sounds: Normal breath sounds. No stridor. No wheezing or rhonchi.     Comments: Diffusely distant bs  Fair air exch  No rales or crackles  Posterior-lat ribs are tender w/o crepitus or step off   Chest:     Chest wall: Tenderness present.  Abdominal:     General: Abdomen is flat. Bowel sounds are normal. There is no distension.     Palpations: Abdomen is soft. There is no mass.     Tenderness: There is no abdominal tenderness.  Musculoskeletal:  General: No deformity.     Right lower leg: No edema.     Left lower leg: No edema.     Comments: No TS tenderness Thoracic musculature and lower post/lat rib tenderness bilat Some pain on flexion of spine  Neg slr   Mild LS tenderness   Myofascial tender points throughout back   Lymphadenopathy:     Cervical: No cervical adenopathy.  Skin:    General: Skin is warm and dry.     Coloration: Skin is not pale.     Findings: No erythema or rash.     Comments: No rash seen on forehead   Healing scab on R side of forehead -no redeness  Neurological:     Mental Status: She is alert. Mental status is at baseline.     Coordination: Coordination normal.     Deep Tendon Reflexes: Reflexes normal.  Psychiatric:        Mood and Affect: Mood is anxious. Affect is not  tearful.     Comments: Slightly anxious but also quiet            Assessment & Plan:   Problem List Items Addressed This Visit      Other   Smoker    Disc in detail risks of smoking and possible outcomes including copd, vascular/ heart disease, cancer , respiratory and sinus infections  Pt voices understanding  Pt has no interest in quitting        Thoracic back pain - Primary    Pt states today's pain is different from last time (had seen orthopedics and did not follow through for injection) Her tenderness seems to be in lower posterolateral ribs  Neg ua  Has chronic cough  cxr today  Methocarbamol px  If no imp and cxr clear will recommend ortho f/u      Relevant Medications   methocarbamol (ROBAXIN) 500 MG tablet   Other Relevant Orders   DG Chest 2 View (Completed)   Large breasts    Pt did see plastic surgeon about a breast reduction  Cannot plan it until she quits smoking  She is not interested in quitting at this time      Cough    Ongoing prod cough /chronic with h/o asthmatic bronchitis Now having rib pain cxr today      Rib pain    Posterolateral on both sides Chronic cough in smoker  Most likely muscular pain  cxr today  Px methocarbamol      Relevant Orders   DG Chest 2 View (Completed)    Other Visit Diagnoses    Low back pain, unspecified back pain laterality, unspecified chronicity, unspecified whether sciatica present       Relevant Medications   methocarbamol (ROBAXIN) 500 MG tablet   Other Relevant Orders   POCT Urinalysis Dipstick (Automated) (Completed)

## 2018-05-06 NOTE — Assessment & Plan Note (Signed)
Pt states today's pain is different from last time (had seen orthopedics and did not follow through for injection) Her tenderness seems to be in lower posterolateral ribs  Neg ua  Has chronic cough  cxr today  Methocarbamol px  If no imp and cxr clear will recommend ortho f/u

## 2018-05-06 NOTE — Assessment & Plan Note (Signed)
Pt did see plastic surgeon about a breast reduction  Cannot plan it until she quits smoking  She is not interested in quitting at this time

## 2018-05-06 NOTE — Assessment & Plan Note (Signed)
Ongoing prod cough /chronic with h/o asthmatic bronchitis Now having rib pain cxr today

## 2018-05-06 NOTE — Assessment & Plan Note (Signed)
Disc in detail risks of smoking and possible outcomes including copd, vascular/ heart disease, cancer , respiratory and sinus infections  Pt voices understanding  Pt has no interest in quitting

## 2018-05-06 NOTE — Assessment & Plan Note (Signed)
Posterolateral on both sides Chronic cough in smoker  Most likely muscular pain  cxr today  Px methocarbamol

## 2018-06-05 ENCOUNTER — Other Ambulatory Visit: Payer: Self-pay | Admitting: Neurology

## 2018-06-08 ENCOUNTER — Other Ambulatory Visit: Payer: Self-pay | Admitting: Neurology

## 2018-07-01 ENCOUNTER — Other Ambulatory Visit: Payer: Self-pay | Admitting: Neurology

## 2018-07-11 ENCOUNTER — Encounter: Payer: Self-pay | Admitting: Family Medicine

## 2018-07-11 ENCOUNTER — Telehealth: Payer: Self-pay | Admitting: *Deleted

## 2018-07-11 ENCOUNTER — Other Ambulatory Visit: Payer: Self-pay

## 2018-07-11 ENCOUNTER — Ambulatory Visit (INDEPENDENT_AMBULATORY_CARE_PROVIDER_SITE_OTHER): Payer: Medicare HMO | Admitting: Family Medicine

## 2018-07-11 VITALS — BP 134/86 | HR 84 | Temp 97.8°F | Ht 61.0 in | Wt 170.2 lb

## 2018-07-11 DIAGNOSIS — J0100 Acute maxillary sinusitis, unspecified: Secondary | ICD-10-CM | POA: Diagnosis not present

## 2018-07-11 DIAGNOSIS — J0110 Acute frontal sinusitis, unspecified: Secondary | ICD-10-CM | POA: Insufficient documentation

## 2018-07-11 DIAGNOSIS — J019 Acute sinusitis, unspecified: Secondary | ICD-10-CM | POA: Insufficient documentation

## 2018-07-11 DIAGNOSIS — F17200 Nicotine dependence, unspecified, uncomplicated: Secondary | ICD-10-CM

## 2018-07-11 DIAGNOSIS — G8929 Other chronic pain: Secondary | ICD-10-CM | POA: Diagnosis not present

## 2018-07-11 DIAGNOSIS — R69 Illness, unspecified: Secondary | ICD-10-CM | POA: Diagnosis not present

## 2018-07-11 DIAGNOSIS — M5442 Lumbago with sciatica, left side: Principal | ICD-10-CM

## 2018-07-11 MED ORDER — AMOXICILLIN-POT CLAVULANATE 875-125 MG PO TABS: 1.0000 | ORAL_TABLET | Freq: Two times a day (BID) | ORAL | 0 refills | Status: DC

## 2018-07-11 MED ORDER — PREDNISONE 10 MG PO TABS: ORAL_TABLET | ORAL | 0 refills | Status: DC

## 2018-07-11 NOTE — Assessment & Plan Note (Signed)
Disc in detail risks of smoking and possible outcomes including copd, vascular/ heart disease, cancer , respiratory and sinus infections  Pt voices understanding She has cut back to 1ppd but not ready to quit Has copd baseline at this point unfortunately

## 2018-07-11 NOTE — Assessment & Plan Note (Signed)
Ongoing Saw [facility] last summer  MRI was unchanged- some deg disc dz Recommendation was made for pain clinic per notes  She cannot take percocet and otc meds have not helped Has also had PT

## 2018-07-11 NOTE — Telephone Encounter (Signed)
[name], Karina Fanny, MD    I saw her for sinus/ear c/o today  She mentioned back pain and wanted medicine -I said I would review her chart  Upon review it looks like [facility] recommended a pain clnic referral in the summer  Please ask her if that was done and if not would she like [place] to try to refer?  Thanks

## 2018-07-11 NOTE — Progress Notes (Signed)
Subjective:    Patient ID: Karina [name], female    DOB: 06-15-1955, 63 y.o.   MRN: [id number]  HPI Here for ear pain - left  2 days  Taking advil and tylenol-no help  Using flonase ns   Nasal d/c is not coming out  pnd  Chronic cough -no changes/not productive  No fever  No covid contacts  Staying home   Started with a sinus infection   Current smoker -has cut back to one ppd  Back pain is worse lately  Last MRI report 7/19  IMPRESSION: No change since 2011. Mild lumbar region degenerative changes with mild non-compressive disc bulges and mild lower lumbar facet osteoarthritis without edema or slippage. This degree of findings is commonly seen in asymptomatic patients at this age. No definite cause of the presenting symptoms is identified.  She desires pain relief   Per rev of [facility]- sounds like a pain clinic ref was made for her or recommended   Patient Active Problem List   Diagnosis Date Noted   Acute sinusitis 07/11/2018   Rib pain 05/05/2018   Seborrheic keratosis, inflamed 12/15/2017   B12 deficiency 08/24/2017   Left low back pain 08/24/2017   Erosive osteoarthritis of both hands 09/09/2016   Fatigue 09/07/2016   Bilateral hand pain 09/07/2016   Rash and nonspecific skin eruption 05/18/2016   Cough 05/18/2016   Routine general medical examination at a health care facility 05/06/2015   Large breasts 05/06/2015   Lumbar disc disease 11/01/2014   Knee pain, bilateral 03/11/2014   Thoracic back pain 11/30/2013   Encounter for routine gynecological examination 01/31/2013   Encounter for Medicare annual wellness exam 01/23/2013   Chest pain 09/20/2012   SOB (shortness of breath) 09/20/2012   Dyspnea 09/13/2012   Asthmatic bronchitis , chronic (HCC) 12/06/2011   Chronic cough 11/22/2011   Anxiety disorder 07/17/2009   HEMORRHOIDS-EXTERNAL 11/07/2008   IRRITABLE BOWEL SYNDROME 11/07/2008   LUNG NODULE 10/14/2008    Prediabetes 10/14/2008   PERSONAL HX COLONIC POLYPS 10/08/2008   Vitamin D deficiency 07/23/2008   SYNCOPE, HX OF 06/01/2008   FOOT SURGERY, HX OF 06/01/2008   DILATION AND CURETTAGE, HX OF 06/01/2008   OSTEOARTHRITIS, SHOULDER, RIGHT 03/22/2008   ROTATOR CUFF SYNDROME, RIGHT 03/22/2008   ARTHRALGIA 12/12/2007   Allergic rhinitis 08/01/2007   Smoker 10/17/2006   Peripheral neuropathy 10/17/2006   FOOT PAIN, BILATERAL 10/17/2006   HYPOGLYCEMIA 09/30/2006   HYPERCHOLESTEROLEMIA 09/30/2006   MULTIPLE SCLEROSIS 09/30/2006   GERD 09/30/2006   FIBROCYSTIC BREAST DISEASE 09/30/2006   Past Medical History:  Diagnosis Date   Allergic rhinitis    Allergy    Anxiety    [place], no per pt   Cataract    Colon polyps 08/2008   Adenomatous   COPD (chronic obstructive pulmonary disease) ([place])    Depression    no per pt   Diabetes mellitus    Gastritis 08/2008   H. pylori   GERD (gastroesophageal reflux disease)    History of shingles    Left V1   Hypercholesteremia    Lung nodule    Menopausal disorder    Multiple sclerosis (HCC)    Neuromuscular disorder (HCC)    Multiple sclerosis   Plantar fasciitis    Seizure disorder ([place])    single seizure/hypoglycemia   Seizures ([place])    1 seizure in 2001 due to blood sugar dropping to 38, none since   Skin lesion 04/05/2017   Urinary  incontinence    Vertigo    Past Surgical History:  Procedure Laterality Date   CERVICAL DISCECTOMY  2004   x 2, Anterior; Fusion C4-5, C5-6, C6-7, Dr. Kary Kos   CHEST CT  11/2008   With small 4 mm nodule L lung base (rec re check in 1 year)   CHEST CT  07/2009   Re check chest CT - lung nodule stable   CHOLECYSTECTOMY     COLONOSCOPY  08/2008   Polyps/ re check 5 yrs   CT SINUS LTD W/O CM  02/2009   negative   DILATION AND CURETTAGE OF UTERUS     ESOPHAGOGASTRODUODENOSCOPY  08/2008   Erosive gastritis, h pylori (treated)   FOOT  SURGERY     left plantar fascial problem   ROTATOR CUFF REPAIR     right   TONSILLECTOMY     TUBAL LIGATION     UPPER GASTROINTESTINAL ENDOSCOPY     Social History   Tobacco Use   Smoking status: Current Every Day Smoker    Years: 30.00    Types: Cigarettes   Smokeless tobacco: Never Used  Substance Use Topics   Alcohol use: No    Alcohol/week: 0.0 standard drinks   Drug use: No   Family History  Problem Relation Age of Onset   Migraines Mother    Heart attack Mother    Coronary artery disease Mother    Coronary artery disease Father        6 bypasses    Stroke Father    Diabetes Father    Renal Disease Father    Colon cancer Maternal Grandfather 34   Coronary artery disease Cousin    Coronary artery disease Paternal Grandmother    Lung cancer Paternal Aunt    Multiple sclerosis Neg Hx    Esophageal cancer Neg Hx    Rectal cancer Neg Hx    Stomach cancer Neg Hx    Breast cancer Neg Hx    Allergies  Allergen Reactions   Percocet [Oxycodone-Acetaminophen] Shortness Of Breath    Felt like going to pass out and sweating   Tecfidera [Dimethyl Fumarate] Shortness Of Breath, Palpitations, Rash and Cough   Amitriptyline Hcl     REACTION: swelling and itching   Atorvastatin     REACTION: elevated LFT's   Betaseron [Interferon Beta-1b]     Headache   Clarithromycin     REACTION: reaction not known   Duloxetine     REACTION: pain   Levofloxacin     REACTION: Rash   Pregabalin     REACTION: LE swelling   Pseudoephedrine     REACTION: legs hurt   Zoloft [Sertraline Hcl] Other (See Comments)    Headaches    Current Outpatient Medications on File Prior to Visit  Medication Sig Dispense Refill   albuterol (VENTOLIN HFA) 108 (90 Base) MCG/ACT inhaler INHALE 2 PUFFS UP TO EVERY 4 HOURS AS NEEDED FOR WHEEZING 3 Inhaler 3   ALPRAZolam (XANAX) 0.5 MG tablet Take 1 tablet (0.5 mg total) by mouth 2 (two) times daily as needed. For  anxiety. 30 tablet 0   azelastine (ASTELIN) 0.1 % nasal spray Place 2 sprays into both nostrils 2 (two) times daily as needed.  12   Blood Glucose Monitoring Suppl (ACCU-CHEK AVIVA PLUS) w/Device KIT Check blood sugar once daily and as directed Dx R73.09 1 kit 0   budesonide-formoterol (SYMBICORT) 160-4.5 MCG/ACT inhaler Inhale 2 puffs into the lungs 2 (two) times daily. 3  Inhaler 2   CRESTOR 20 MG tablet Take 1 tablet (20 mg total) by mouth daily. (Patient taking differently: Take 10 mg by mouth daily. ) 90 tablet 3   Diclofenac Sodium 3 % GEL Place 1 application onto the skin every 12 (twelve) hours as needed. 50 g 0   gabapentin (NEURONTIN) 300 MG capsule TAKE 1 CAPSULE BY MOUTH TWICE DURING THE DAY AND 2 CAPSULES BY MOUTH AT NIGHT 360 capsule 4   ipratropium-albuterol (DUONEB) 0.5-2.5 (3) MG/3ML SOLN Take 3 mLs by nebulization every 6 (six) hours as needed. 360 mL 5   methocarbamol (ROBAXIN) 500 MG tablet Take 1 tablet (500 mg total) by mouth every 8 (eight) hours as needed for muscle spasms (back pain). Caution of sedation 30 tablet 1   pantoprazole (PROTONIX) 40 MG tablet Take 1 tablet (40 mg total) by mouth 2 (two) times daily before a meal. 60 tablet 2   Probiotic Product (ALIGN PO) Take by mouth daily.     vitamin B-12 (CYANOCOBALAMIN) 1000 MCG tablet Take 1,000 mcg by mouth daily.     No current facility-administered medications on file prior to visit.      Review of Systems  Constitutional: Positive for activity change and fatigue. Negative for appetite change, fever and unexpected weight change.  HENT: Positive for congestion, ear pain, postnasal drip, sinus pressure and sinus pain. Negative for ear discharge, facial swelling, nosebleeds, rhinorrhea, sore throat and voice change.   Eyes: Negative for pain, redness and visual disturbance.  Respiratory: Positive for cough and wheezing. Negative for choking, chest tightness and shortness of breath.        Baseline wheezing  /she has copd  Cardiovascular: Negative for chest pain and palpitations.  Gastrointestinal: Negative for abdominal pain, blood in stool, constipation and diarrhea.  Endocrine: Negative for polydipsia and polyuria.  Genitourinary: Negative for dysuria, frequency and urgency.  Musculoskeletal: Positive for back pain. Negative for arthralgias and myalgias.  Skin: Negative for pallor and rash.  Allergic/Immunologic: Negative for environmental allergies.  Neurological: Negative for dizziness, syncope and headaches.  Hematological: Negative for adenopathy. Does not bruise/bleed easily.  Psychiatric/Behavioral: Negative for decreased concentration and dysphoric mood. The patient is not nervous/anxious.        Objective:   Physical Exam Constitutional:      General: She is not in acute distress.    Appearance: Normal appearance. She is obese. She is not ill-appearing.  HENT:     Head: Normocephalic and atraumatic.     Comments: L maxillary/frontal sinus tenderness Mild R maxillary sinus tenderness    Right Ear: Tympanic membrane, ear canal and external ear normal. There is no impacted cerumen.     Left Ear: Ear canal and external ear normal. There is no impacted cerumen.     Ears:     Comments: L TM is dull/ pink in color    Nose: Congestion present.     Mouth/Throat:     Mouth: Mucous membranes are moist.     Pharynx: Oropharynx is clear. No oropharyngeal exudate or posterior oropharyngeal erythema.  Eyes:     General: No scleral icterus.       Right eye: No discharge.        Left eye: No discharge.     Extraocular Movements: Extraocular movements intact.     Conjunctiva/sclera: Conjunctivae normal.     Pupils: Pupils are equal, round, and reactive to light.  Neck:     Musculoskeletal: Normal range of motion and neck supple.  No muscular tenderness.  Cardiovascular:     Rate and Rhythm: Normal rate and regular rhythm.  Pulmonary:     Effort: Pulmonary effort is normal. No  respiratory distress.     Breath sounds: No stridor. Wheezing present. No rhonchi or rales.     Comments: Scant end exp wheeze at bases No rales  Diffusely distant bs -consistent with her copd Chest:     Chest wall: No tenderness.  Musculoskeletal:     Comments: Poor rom of TS and LS  Lymphadenopathy:     Cervical: No cervical adenopathy.  Skin:    General: Skin is warm and dry.     Coloration: Skin is not jaundiced.     Findings: No rash.  Neurological:     Mental Status: She is alert. Mental status is at baseline.  Psychiatric:     Comments: Blunted affect           Assessment & Plan:   Problem List Items Addressed This Visit      Respiratory   Acute sinusitis - Primary    For over a week with congestion and L sided facial pain /now more severe ear pain  Px augmentin  Also prednisone for inflammation (aware of side eff)  Recommend flonase and nasal saline Lots of fluids Analgesics for pain -tylenol or nsaid  Update if not starting to improve in a week or if worsening   Watch closely for fever or inc cough or wheezing  She is high risk for covid -has been staying home Enc her to wear a mask       Relevant Medications   amoxicillin-clavulanate (AUGMENTIN) 875-125 MG tablet   predniSONE (DELTASONE) 10 MG tablet     Other   Smoker    Disc in detail risks of smoking and possible outcomes including copd, vascular/ heart disease, cancer , respiratory and sinus infections  Pt voices understanding She has cut back to 1ppd but not ready to quit Has copd baseline at this point unfortunately      Left low back pain    Ongoing [facility] last summer  MRI was unchanged- some deg disc dz Recommendation was made for pain clinic per notes  She cannot take percocet and otc meds have not helped Has also had PT      Relevant Medications   predniSONE (DELTASONE) 10 MG tablet

## 2018-07-11 NOTE — Assessment & Plan Note (Signed)
For over a week with congestion and L sided facial pain /now more severe ear pain  Px augmentin  Also prednisone for inflammation (aware of side eff)  Recommend flonase and nasal saline Lots of fluids Analgesics for pain -tylenol or nsaid  Update if not starting to improve in a week or if worsening   Watch closely for fever or inc cough or wheezing  She is high risk for covid -has been staying home Enc her to wear a mask

## 2018-07-11 NOTE — Patient Instructions (Signed)
Please work on quitting smoking Take the augmentin for sinus and ear infection Prednisone should help (also wheezing)  If worse or fever let us know right away  Rest and stay home   Drink lots of fluids

## 2018-07-11 NOTE — Telephone Encounter (Signed)
Spoken to patient and she did not go the pain clinic due to being sick at the time.  She would like a referral to pain clinic again.  Also patient is requesting medication for the back pain.

## 2018-07-12 NOTE — Telephone Encounter (Signed)
Thanks

## 2018-07-12 NOTE — Telephone Encounter (Signed)
I was able to schedule the patient for a consultation with Dr Rhys Martini, [facility],  the Dr she was set up with in the summer. Her Appt is 07/24/2018 at 12:45pm, patient has been notified.

## 2018-07-24 ENCOUNTER — Telehealth: Payer: Self-pay

## 2018-07-24 DIAGNOSIS — M545 Low back pain: Secondary | ICD-10-CM | POA: Diagnosis not present

## 2018-07-24 NOTE — Telephone Encounter (Signed)
Spoke with the patient and they have given verbal consent to file their insurance and to do a [email or web address] visit. E-mail have been confirmed and sent.  E-mail: [email or web address]

## 2018-07-27 ENCOUNTER — Encounter: Payer: Self-pay | Admitting: Adult Health

## 2018-07-27 ENCOUNTER — Ambulatory Visit (INDEPENDENT_AMBULATORY_CARE_PROVIDER_SITE_OTHER): Payer: Medicare HMO | Admitting: Adult Health

## 2018-07-27 ENCOUNTER — Other Ambulatory Visit: Payer: Self-pay

## 2018-07-27 DIAGNOSIS — G35 Multiple sclerosis: Secondary | ICD-10-CM | POA: Diagnosis not present

## 2018-07-27 NOTE — Progress Notes (Signed)
I have read the note, and I agree with the clinical assessment and plan.  Teyonna Plaisted K Nayvie Lips   

## 2018-07-27 NOTE — Progress Notes (Signed)
PATIENT: Karina [name] DOB: November 17, 1955  REASON FOR VISIT: follow up HISTORY FROM: patient  Virtual Visit via Video Note  I connected with Karina [name] on 07/27/18 at  1:00 PM EDT by a video enabled telemedicine application located remotely at [facility] and verified that I am speaking with the correct person using two identifiers who was located at their own home.   I discussed the limitations of evaluation and management by telemedicine and the availability of in person appointments. The patient expressed understanding and agreed to proceed.   PATIENT: Karina [name] DOB: 10-23-1955  REASON FOR VISIT: follow up HISTORY FROM: patient  HISTORY OF PRESENT ILLNESS: Today 07/27/18:  Karina [name] is a 63 year old female with a history of multiple sclerosis.  She returns today for virtual visit.  She is not on any disease modifying therapy for multiple sclerosis.  She denies any new numbness or weakness.  She does feel that her balance is worse but this is been a gradual change.  No falls.  Denies any changes with her vision.  She continues on gabapentin.  She finds it beneficial for her legs.  She returns today for a virtual visit.  HISTORY 11/15/17:  Karina [name] is a 63 year old female with a history of multiple sclerosis and neck pain.  She returns today for follow-up.  In regards to multiple sclerosis she denies any new symptoms.  She denies any weakness.  No change in the bowel or bladder.  No change in the vision.  She reports that she does have on and off blurry vision but this has been ongoing.  She denies any changes with her gait or balance.  She reports on occasion she has issues with word finding.  She states that she can be in mid conversation and cannot think of the word that she wants to say however later on it may come to her.  She states that she has had a bad headache for the last 2 weeks however that has improved.  Her headaches seem to occur in the  neck and occipital region.  At the last visit she was referred to neuromuscular therapy but never got called to schedule this appointment.  She does report that her whole body hurts.  She states for that reason she often has an irritable mood.  She returns today for evaluation.   REVIEW OF SYSTEMS: Out of a complete 14 system review of symptoms, the patient complains only of the following symptoms, and all other reviewed systems are negative.  ALLERGIES: Allergies  Allergen Reactions  . Percocet [Oxycodone-Acetaminophen] Shortness Of Breath    Felt like going to pass out and sweating  . Tecfidera [Dimethyl Fumarate] Shortness Of Breath, Palpitations, Rash and Cough  . Amitriptyline Hcl     REACTION: swelling and itching  . Atorvastatin     REACTION: elevated LFT's  . Betaseron [Interferon Beta-1b]     Headache  . Clarithromycin     REACTION: reaction not known  . Duloxetine     REACTION: pain  . Levofloxacin     REACTION: Rash  . Pregabalin     REACTION: LE swelling  . Pseudoephedrine     REACTION: legs hurt  . Zoloft [Sertraline Hcl] Other (See Comments)    Headaches     HOME MEDICATIONS: Outpatient Medications Prior to Visit  Medication Sig Dispense Refill  . albuterol (VENTOLIN HFA) 108 (90 Base) MCG/ACT inhaler INHALE 2 PUFFS UP TO EVERY 4 HOURS  AS NEEDED FOR WHEEZING 3 Inhaler 3  . ALPRAZolam (XANAX) 0.5 MG tablet Take 1 tablet (0.5 mg total) by mouth 2 (two) times daily as needed. For anxiety. 30 tablet 0  . amoxicillin-clavulanate (AUGMENTIN) 875-125 MG tablet Take 1 tablet by mouth 2 (two) times daily. 14 tablet 0  . azelastine (ASTELIN) 0.1 % nasal spray Place 2 sprays into both nostrils 2 (two) times daily as needed.  12  . Blood Glucose Monitoring Suppl (ACCU-CHEK AVIVA PLUS) w/Device KIT Check blood sugar once daily and as directed Dx R73.09 1 kit 0  . budesonide-formoterol (SYMBICORT) 160-4.5 MCG/ACT inhaler Inhale 2 puffs into the lungs 2 (two) times daily. 3  Inhaler 2  . CRESTOR 20 MG tablet Take 1 tablet (20 mg total) by mouth daily. (Patient taking differently: Take 10 mg by mouth daily. ) 90 tablet 3  . Diclofenac Sodium 3 % GEL Place 1 application onto the skin every 12 (twelve) hours as needed. 50 g 0  . gabapentin (NEURONTIN) 300 MG capsule TAKE 1 CAPSULE BY MOUTH TWICE DURING THE DAY AND 2 CAPSULES BY MOUTH AT NIGHT 360 capsule 4  . ipratropium-albuterol (DUONEB) 0.5-2.5 (3) MG/3ML SOLN Take 3 mLs by nebulization every 6 (six) hours as needed. 360 mL 5  . methocarbamol (ROBAXIN) 500 MG tablet Take 1 tablet (500 mg total) by mouth every 8 (eight) hours as needed for muscle spasms (back pain). Caution of sedation 30 tablet 1  . pantoprazole (PROTONIX) 40 MG tablet Take 1 tablet (40 mg total) by mouth 2 (two) times daily before a meal. 60 tablet 2  . predniSONE (DELTASONE) 10 MG tablet Take 3 pills once daily by mouth for 3 days, then 2 pills once daily for 3 days, then 1 pill once daily for 3 days and then stop 18 tablet 0  . Probiotic Product (ALIGN PO) Take by mouth daily.    . vitamin B-12 (CYANOCOBALAMIN) 1000 MCG tablet Take 1,000 mcg by mouth daily.     No facility-administered medications prior to visit.     PAST MEDICAL HISTORY: Past Medical History:  Diagnosis Date  . Allergic rhinitis   . Allergy   . Anxiety    [place]- Karina [name], no per pt  . Cataract   . Colon polyps 08/2008   Adenomatous  . COPD (chronic obstructive pulmonary disease) ([place])   . Depression    no per pt  . Diabetes mellitus   . Gastritis 08/2008   H. pylori  . GERD (gastroesophageal reflux disease)   . History of shingles    Left V1  . Hypercholesteremia   . Lung nodule   . Menopausal disorder   . Multiple sclerosis ([place])   . Neuromuscular disorder ([place])    Multiple sclerosis  . Plantar fasciitis   . Seizure disorder ([place])    single seizure/hypoglycemia  . Seizures ([place])    1 seizure in 2001 due to blood sugar dropping to 38, none since  .  Skin lesion 04/05/2017  . Urinary incontinence   . Vertigo     PAST SURGICAL HISTORY: Past Surgical History:  Procedure Laterality Date  . CERVICAL DISCECTOMY  2004   x 2, Anterior; Fusion C4-5, C5-6, C6-7, Dr. Kary Kos  . CHEST CT  11/2008   With small 4 mm nodule L lung base (rec re check in 1 year)  . CHEST CT  07/2009   Re check chest CT - lung nodule stable  . CHOLECYSTECTOMY    . COLONOSCOPY  08/2008   Polyps/ re check 5 yrs  . CT SINUS LTD W/O CM  02/2009   negative  . DILATION AND CURETTAGE OF UTERUS    . ESOPHAGOGASTRODUODENOSCOPY  08/2008   Erosive gastritis, h pylori (treated)  . FOOT SURGERY     left plantar fascial problem  . ROTATOR CUFF REPAIR     right  . TONSILLECTOMY    . TUBAL LIGATION    . UPPER GASTROINTESTINAL ENDOSCOPY      FAMILY HISTORY: Family History  Problem Relation Age of Onset  . Migraines Mother   . Heart attack Mother   . Coronary artery disease Mother   . Coronary artery disease Father        6 bypasses   . Stroke Father   . Diabetes Father   . Renal Disease Father   . Colon cancer Maternal Grandfather 76  . Coronary artery disease Cousin   . Coronary artery disease Paternal Grandmother   . Lung cancer Paternal Aunt   . Multiple sclerosis Neg Hx   . Esophageal cancer Neg Hx   . Rectal cancer Neg Hx   . Stomach cancer Neg Hx   . Breast cancer Neg Hx     SOCIAL HISTORY: Social History   Socioeconomic History  . Marital status: Married    Spouse name: Not on file  . Number of children: 3  . Years of education: 9 th  . Highest education level: Not on file  Occupational History  . Occupation: Disabled     Employer: DISABLED  Social Needs  . Financial resource strain: Not on file  . Food insecurity:    Worry: Not on file    Inability: Not on file  . Transportation needs:    Medical: Not on file    Non-medical: Not on file  Tobacco Use  . Smoking status: Current Every Day Smoker    Years: 30.00    Types: Cigarettes   . Smokeless tobacco: Never Used  Substance and Sexual Activity  . Alcohol use: No    Alcohol/week: 0.0 standard drinks  . Drug use: No  . Sexual activity: Not on file  Lifestyle  . Physical activity:    Days per week: Not on file    Minutes per session: Not on file  . Stress: Not on file  Relationships  . Social connections:    Talks on phone: Not on file    Gets together: Not on file    Attends religious service: Not on file    Active member of club or organization: Not on file    Attends meetings of clubs or organizations: Not on file    Relationship status: Not on file  . Intimate partner violence:    Fear of current or ex partner: Not on file    Emotionally abused: Not on file    Physically abused: Not on file    Forced sexual activity: Not on file  Other Topics Concern  . Not on file  Social History Narrative   Married with 3 children   Daily caffeine use - 2    Disabled.   Education 9th grade    Caffeine one cup of coffee daily.   Patient is right handed.       PHYSICAL EXAM Generalized: Well developed, in no acute distress   Neurological examination  Mentation: Alert oriented to time, place, history taking. Follows all commands speech and language fluent Cranial nerve II-XII:Extraocular movements were full. Facial symmetry noted.  uvula tongue midline. Head turning and shoulder shrug  were normal and symmetric. Motor: Good strength throughout subjectively per patient Sensory: Sensory testing is intact to soft touch on all 4 extremities subjectively per patient Coordination: Cerebellar testing reveals good finger-nose-finger  Gait and station: Patient is able to stand from a seated position. gait is normal.  Reflexes: UTA  DIAGNOSTIC DATA (LABS, IMAGING, TESTING) - I reviewed patient records, labs, notes, testing and imaging myself where available.  Lab Results  Component Value Date   WBC 7.4 08/11/2017   HGB 15.1 (H) 08/11/2017   HCT 44.8 08/11/2017    MCV 96.0 08/11/2017   PLT 204.0 08/11/2017      Component Value Date/Time   NA 137 08/11/2017 1644   NA 141 11/21/2014 1137   NA 136 07/09/2013 1803   K 4.5 08/11/2017 1644   K 4.2 07/09/2013 1803   CL 100 08/11/2017 1644   CL 105 07/09/2013 1803   CO2 28 08/11/2017 1644   CO2 22 07/09/2013 1803   GLUCOSE 79 08/11/2017 1644   GLUCOSE 96 07/09/2013 1803   BUN 10 08/11/2017 1644   BUN 11 11/21/2014 1137   BUN 16 07/09/2013 1803   CREATININE 0.98 08/11/2017 1644   CREATININE 1.13 07/09/2013 1803   CALCIUM 10.0 08/11/2017 1644   CALCIUM 9.6 07/09/2013 1803   PROT 7.6 08/11/2017 1644   PROT 6.9 11/21/2014 1137   ALBUMIN 4.4 08/11/2017 1644   ALBUMIN 4.3 11/21/2014 1137   AST 20 08/11/2017 1644   ALT 14 08/11/2017 1644   ALKPHOS 70 08/11/2017 1644   BILITOT 0.4 08/11/2017 1644   BILITOT 0.3 11/21/2014 1137   GFRNONAA >60 03/31/2017 0111   GFRNONAA 55 (L) 07/09/2013 1803   GFRAA >60 03/31/2017 0111   GFRAA >60 07/09/2013 1803   Lab Results  Component Value Date   CHOL 266 (H) 09/01/2016   HDL 62.20 09/01/2016   LDLCALC 178 (H) 09/01/2016   LDLDIRECT 206.5 01/24/2013   TRIG 130.0 09/01/2016   CHOLHDL 4 09/01/2016   Lab Results  Component Value Date   HGBA1C 5.9 09/01/2016   Lab Results  Component Value Date   VITAMINB12 843 09/22/2017   Lab Results  Component Value Date   TSH 1.64 08/11/2017      ASSESSMENT AND PLAN 63 y.o. year old female  has a past medical history of Allergic rhinitis, Allergy, Anxiety, Cataract, Colon polyps (08/2008), COPD (chronic obstructive pulmonary disease) ([place]), Depression, Diabetes mellitus, Gastritis (08/2008), GERD (gastroesophageal reflux disease), History of shingles, Hypercholesteremia, Lung nodule, Menopausal disorder, Multiple sclerosis ([place]), Neuromuscular disorder ([place]), Plantar fasciitis, Seizure disorder ([place]), Seizures ([place]), Skin lesion (04/05/2017), Urinary incontinence, and Vertigo. here with:  1.  Multiple sclerosis   Overall the patient is doing well.  Her MRI of the brain from 05/20/17 did not show any significant changes.  She is advised to continue monitor symptoms.  If her symptoms worsen or she develops new symptoms she should let us know.  She will follow-up in 6 months or sooner if needed.   I spent 15 minutes with the patient. 50% of this time was spent   Ward Givens, MSN, NP-C 07/27/2018, 3:22 PM [facility] [address] [place], [place] [address] [phone]

## 2018-09-05 ENCOUNTER — Other Ambulatory Visit: Payer: Self-pay | Admitting: Internal Medicine

## 2018-09-05 DIAGNOSIS — R69 Illness, unspecified: Secondary | ICD-10-CM | POA: Diagnosis not present

## 2018-09-06 ENCOUNTER — Other Ambulatory Visit: Payer: Self-pay | Admitting: Physician Assistant

## 2018-09-27 ENCOUNTER — Encounter: Payer: Self-pay | Admitting: Gastroenterology

## 2018-09-27 IMAGING — DX DG CHEST 2V
2 series · 2 of 2 positions shown · non-contrast
Comparison: 05/14/2014

CLINICAL DATA: Cough; + smoker

EXAM:
CHEST  2 VIEW

[chest pa]
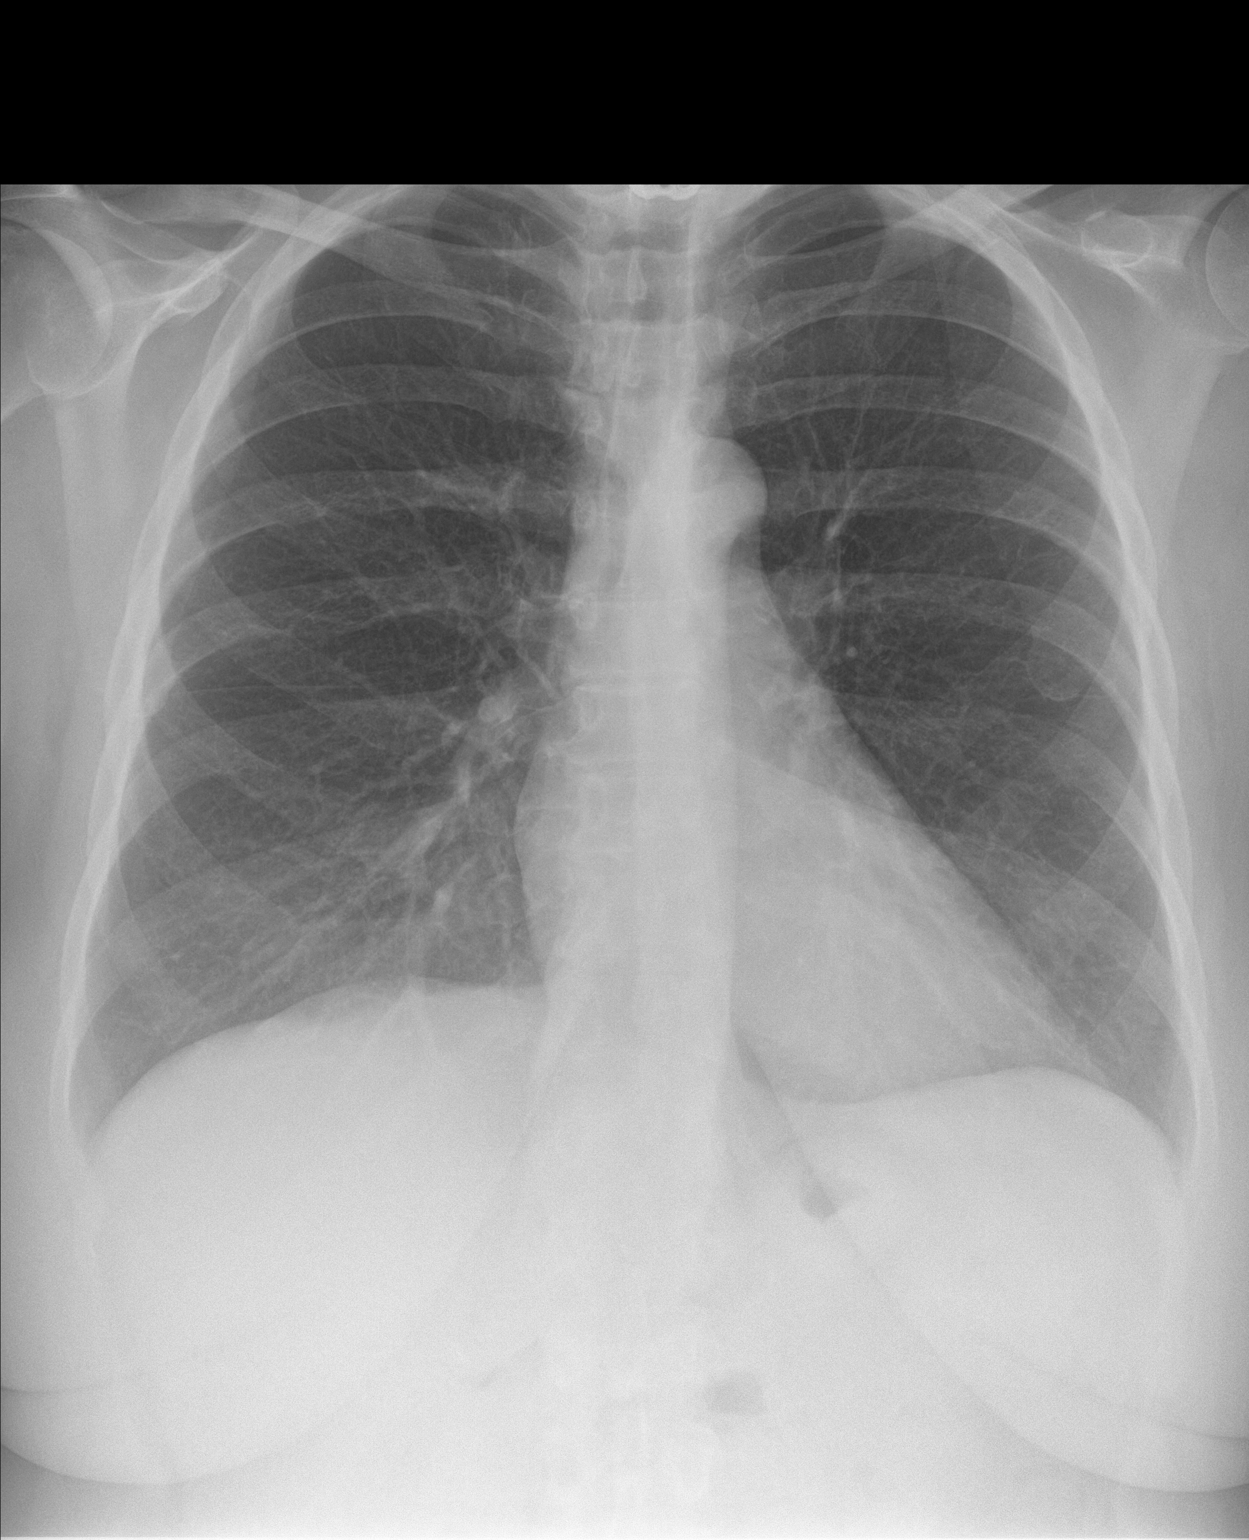

[chest lat]
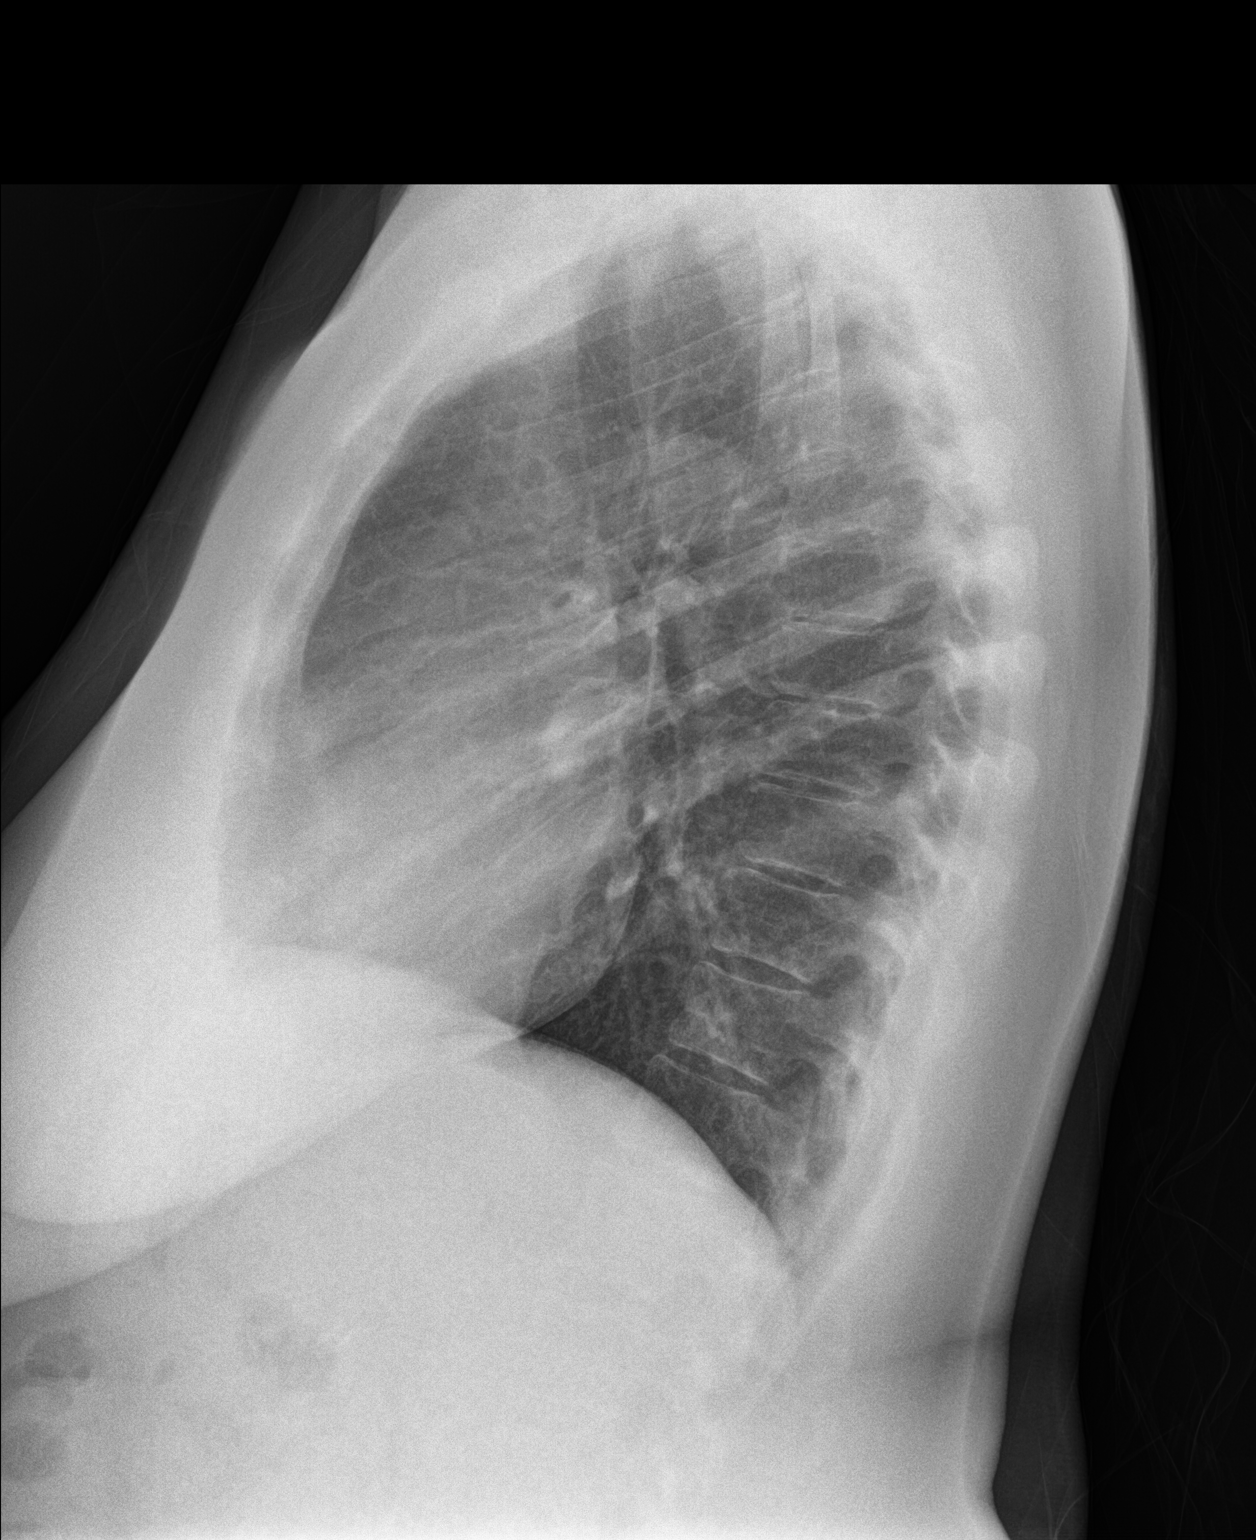

[2 of 2 positions shown; findings below may reference images not displayed]

FINDINGS: Midline trachea.  Normal heart size and mediastinal contours.

Sharp costophrenic angles.  No pneumothorax.  Clear lungs.

Lower cervical spine fixation.
IMPRESSION: No active cardiopulmonary disease.

## 2018-10-30 ENCOUNTER — Telehealth: Payer: Self-pay | Admitting: *Deleted

## 2018-10-30 NOTE — Telephone Encounter (Signed)
We cannot have her in the office due to respiratory symptoms.  She does need to be seen- urgent care may be a good choice.  She should touch base with pulmonary about her resp symptoms.   Strongly enc fluids- 64 oz daily if possible as well

## 2018-10-30 NOTE — Telephone Encounter (Signed)
Patient called to schedule an appointment and was transferred to triage because of her symptoms. Patient stated that she has congestion, productive cough/clear/thick and sinus drainage, but this has be going on for years and sees a lung doctor and he has ordered sputum test. Patient stated that she told them she can not cough up chunks like they want. Patient stated that she is still smoking. Patient stated that she started about a week and a half ago with lower abdominal pain, weakness, dizziness. Patient stated that she is not having any UTI symptoms. Patient stated that she thinks that she is dehydrated because she is not able to eat and has lost a lot of weight. Patient stated that she has not had a fever that she knows of, but no way of checking it.

## 2018-10-30 NOTE — Telephone Encounter (Signed)
Pt notified of Dr. [name]'s comments and recommendations  

## 2018-12-29 ENCOUNTER — Encounter (INDEPENDENT_AMBULATORY_CARE_PROVIDER_SITE_OTHER): Payer: Self-pay

## 2019-01-22 ENCOUNTER — Telehealth: Payer: Self-pay | Admitting: Family Medicine

## 2019-01-22 NOTE — Telephone Encounter (Signed)
Done and in IN box 

## 2019-01-22 NOTE — Telephone Encounter (Signed)
Pt notified form ready for pick up 

## 2019-01-22 NOTE — Telephone Encounter (Signed)
Pt dropped off a parking placard form to be filled out. Placed in [place] tower.

## 2019-01-22 NOTE — Telephone Encounter (Signed)
In your inbox.

## 2019-01-31 ENCOUNTER — Ambulatory Visit: Payer: Medicare HMO | Admitting: Adult Health

## 2019-01-31 ENCOUNTER — Other Ambulatory Visit: Payer: Self-pay

## 2019-01-31 VITALS — BP 122/80 | HR 74 | Temp 97.6°F | Ht 64.0 in | Wt 154.8 lb

## 2019-01-31 DIAGNOSIS — G35 Multiple sclerosis: Secondary | ICD-10-CM

## 2019-01-31 NOTE — Progress Notes (Signed)
  PATIENT: Karina [name] DOB: 08/28/1955  REASON FOR VISIT: follow up HISTORY FROM: patient  HISTORY OF PRESENT ILLNESS: Today 01/31/19:  Karina [name] is a 63-year-old female with a history of multiple sclerosis.  She returns today for follow-up.  She is currently not on any disease modifying therapy.  Overall she states that she has done well.  She denies any new numbness or tingling.  Continues to have numbness and tingling in the toes and feet.  Feels that it may have progressed some over time but no new symptoms.  Denies any changes with her gait or balance other than at times she feels dizzy.  She states that sometimes it feels like vertigo but most the time is just an off-balance feeling.  No changes with the bowels or bladder.  No changes with her vision.  Gabapentin remains beneficial for her legs.  She does mention that she has lost a significant amount of weight in the last 3 months.  She has not seen her PCP.  She returns today for an evaluation.  HISTORY 07/27/18:  Karina [name] is a 62-year-old female with a history of multiple sclerosis.  She returns today for virtual visit.  She is not on any disease modifying therapy for multiple sclerosis.  She denies any new numbness or weakness.  She does feel that her balance is worse but this is been a gradual change.  No falls.  Denies any changes with her vision.  She continues on gabapentin.  She finds it beneficial for her legs.  She returns today for a virtual visit.  REVIEW OF SYSTEMS: Out of a complete 14 system review of symptoms, the patient complains only of the following symptoms, and all other reviewed systems are negative.  See HPI  ALLERGIES: Allergies  Allergen Reactions  . Percocet [Oxycodone-Acetaminophen] Shortness Of Breath    Felt like going to pass out and sweating  . Tecfidera [Dimethyl Fumarate] Shortness Of Breath, Palpitations, Rash and Cough  . Amitriptyline Hcl     REACTION: swelling and itching  .  Atorvastatin     REACTION: elevated LFT's  . Betaseron [Interferon Beta-1b]     Headache  . Clarithromycin     REACTION: reaction not known  . Duloxetine     REACTION: pain  . Levofloxacin     REACTION: Rash  . Pregabalin     REACTION: LE swelling  . Pseudoephedrine     REACTION: legs hurt  . Zoloft [Sertraline Hcl] Other (See Comments)    Headaches     HOME MEDICATIONS: Outpatient Medications Prior to Visit  Medication Sig Dispense Refill  . albuterol (VENTOLIN HFA) 108 (90 Base) MCG/ACT inhaler INHALE 2 PUFFS UP TO EVERY 4 HOURS AS NEEDED FOR WHEEZING 3 Inhaler 3  . ALPRAZolam (XANAX) 0.5 MG tablet Take 1 tablet (0.5 mg total) by mouth 2 (two) times daily as needed. For anxiety. 30 tablet 0  . amoxicillin-clavulanate (AUGMENTIN) 875-125 MG tablet Take 1 tablet by mouth 2 (two) times daily. 14 tablet 0  . azelastine (ASTELIN) 0.1 % nasal spray Place 2 sprays into both nostrils 2 (two) times daily as needed.  12  . Blood Glucose Monitoring Suppl (ACCU-CHEK AVIVA PLUS) w/Device KIT Check blood sugar once daily and as directed Dx R73.09 1 kit 0  . budesonide-formoterol (SYMBICORT) 160-4.5 MCG/ACT inhaler Inhale 2 puffs into the lungs 2 (two) times daily. 3 Inhaler 2  . CRESTOR 20 MG tablet Take 1 tablet (20 mg total) by mouth   daily. (Patient taking differently: Take 10 mg by mouth daily. ) 90 tablet 3  . Diclofenac Sodium 3 % GEL Place 1 application onto the skin every 12 (twelve) hours as needed. 50 g 0  . gabapentin (NEURONTIN) 300 MG capsule TAKE 1 CAPSULE BY MOUTH TWICE DURING THE DAY AND 2 CAPSULES BY MOUTH AT NIGHT 360 capsule 4  . ipratropium-albuterol (DUONEB) 0.5-2.5 (3) MG/3ML SOLN TAKE 3 MLS BY NEBULIZATION EVERY 6 (SIX) HOURS AS NEEDED. 360 mL 0  . methocarbamol (ROBAXIN) 500 MG tablet Take 1 tablet (500 mg total) by mouth every 8 (eight) hours as needed for muscle spasms (back pain). Caution of sedation 30 tablet 1  . pantoprazole (PROTONIX) 40 MG tablet TAKE 1 TABLET (40  MG TOTAL) BY MOUTH 2 (TWO) TIMES DAILY BEFORE A MEAL. 60 tablet 0  . predniSONE (DELTASONE) 10 MG tablet Take 3 pills once daily by mouth for 3 days, then 2 pills once daily for 3 days, then 1 pill once daily for 3 days and then stop 18 tablet 0  . Probiotic Product (ALIGN PO) Take by mouth daily.    . vitamin B-12 (CYANOCOBALAMIN) 1000 MCG tablet Take 1,000 mcg by mouth daily.     No facility-administered medications prior to visit.     PAST MEDICAL HISTORY: Past Medical History:  Diagnosis Date  . Allergic rhinitis   . Allergy   . Anxiety    [facility] [place], no per pt  . Cataract   . Colon polyps 08/2008   Adenomatous  . COPD (chronic obstructive pulmonary disease) ([place])   . Depression    no per pt  . Diabetes mellitus   . Gastritis 08/2008   H. pylori  . GERD (gastroesophageal reflux disease)   . History of shingles    Left V1  . Hypercholesteremia   . Lung nodule   . Menopausal disorder   . Multiple sclerosis ([place])   . Neuromuscular disorder ([place])    Multiple sclerosis  . Plantar fasciitis   . Seizure disorder ([place])    single seizure/hypoglycemia  . Seizures ([place])    1 seizure in 2001 due to blood sugar dropping to 38, none since  . Skin lesion 04/05/2017  . Urinary incontinence   . Vertigo     PAST SURGICAL HISTORY: Past Surgical History:  Procedure Laterality Date  . CERVICAL DISCECTOMY  2004   x 2, Anterior; Fusion C4-5, C5-6, C6-7, Dr. Kary Kos  . CHEST CT  11/2008   With small 4 mm nodule L lung base (rec re check in 1 year)  . CHEST CT  07/2009   Re check chest CT - lung nodule stable  . CHOLECYSTECTOMY    . COLONOSCOPY  08/2008   Polyps/ re check 5 yrs  . CT SINUS LTD W/O CM  02/2009   negative  . DILATION AND CURETTAGE OF UTERUS    . ESOPHAGOGASTRODUODENOSCOPY  08/2008   Erosive gastritis, h pylori (treated)  . FOOT SURGERY     left plantar fascial problem  . ROTATOR CUFF REPAIR     right  . TONSILLECTOMY    . TUBAL LIGATION    .  UPPER GASTROINTESTINAL ENDOSCOPY      FAMILY HISTORY: Family History  Problem Relation Age of Onset  . Migraines Mother   . Heart attack Mother   . Coronary artery disease Mother   . Coronary artery disease Father        6 bypasses   . Stroke  Father   . Diabetes Father   . Renal Disease Father   . Colon cancer Maternal Grandfather 84  . Coronary artery disease Cousin   . Coronary artery disease Paternal Grandmother   . Lung cancer Paternal Aunt   . Multiple sclerosis Neg Hx   . Esophageal cancer Neg Hx   . Rectal cancer Neg Hx   . Stomach cancer Neg Hx   . Breast cancer Neg Hx     SOCIAL HISTORY: Social History   Socioeconomic History  . Marital status: Married    Spouse name: Not on file  . Number of children: 3  . Years of education: 9 th  . Highest education level: Not on file  Occupational History  . Occupation: Disabled     Employer: DISABLED  Social Needs  . Financial resource strain: Not on file  . Food insecurity    Worry: Not on file    Inability: Not on file  . Transportation needs    Medical: Not on file    Non-medical: Not on file  Tobacco Use  . Smoking status: Current Every Day Smoker    Years: 30.00    Types: Cigarettes  . Smokeless tobacco: Never Used  Substance and Sexual Activity  . Alcohol use: No    Alcohol/week: 0.0 standard drinks  . Drug use: No  . Sexual activity: Not on file  Lifestyle  . Physical activity    Days per week: Not on file    Minutes per session: Not on file  . Stress: Not on file  Relationships  . Social Herbalist on phone: Not on file    Gets together: Not on file    Attends religious service: Not on file    Active member of club or organization: Not on file    Attends meetings of clubs or organizations: Not on file    Relationship status: Not on file  . Intimate partner violence    Fear of current or ex partner: Not on file    Emotionally abused: Not on file    Physically abused: Not on file     Forced sexual activity: Not on file  Other Topics Concern  . Not on file  Social History Narrative   Married with 3 children   Daily caffeine use - 2    Disabled.   Education 9th grade    Caffeine one cup of coffee daily.   Patient is right handed.       PHYSICAL EXAM  Vitals:   01/31/19 1325  BP: 122/80  Pulse: 74  Temp: 97.6 F (36.4 C)  SpO2: 95%  Weight: 154 lb 12.8 oz (70.2 kg)  Height: 5' 4" (1.626 m)   There is no height or weight on file to calculate BMI.  Generalized: Well developed, in no acute distress   Neurological examination  Mentation: Alert oriented to time, place, history taking. Follows all commands speech and language fluent Cranial nerve II-XII: Pupils were equal round reactive to light. Extraocular movements were full, visual field were full on confrontational test.Head turning and shoulder shrug  were normal and symmetric. Motor: The motor testing reveals 5 over 5 strength of all 4 extremities. Good symmetric motor tone is noted throughout.  Sensory: Sensory testing is intact to soft touch on all 4 extremities. No evidence of extinction is noted.  Coordination: Cerebellar testing reveals good finger-nose-finger and heel-to-shin bilaterally.  Gait and station: Gait is normal.  Reflexes: Deep tendon reflexes  are symmetric and normal bilaterally.   DIAGNOSTIC DATA (LABS, IMAGING, TESTING) - I reviewed patient records, labs, notes, testing and imaging myself where available.  Lab Results  Component Value Date   WBC 7.4 08/11/2017   HGB 15.1 (H) 08/11/2017   HCT 44.8 08/11/2017   MCV 96.0 08/11/2017   PLT 204.0 08/11/2017      Component Value Date/Time   NA 137 08/11/2017 1644   NA 141 11/21/2014 1137   NA 136 07/09/2013 1803   K 4.5 08/11/2017 1644   K 4.2 07/09/2013 1803   CL 100 08/11/2017 1644   CL 105 07/09/2013 1803   CO2 28 08/11/2017 1644   CO2 22 07/09/2013 1803   GLUCOSE 79 08/11/2017 1644   GLUCOSE 96 07/09/2013 1803   BUN 10  08/11/2017 1644   BUN 11 11/21/2014 1137   BUN 16 07/09/2013 1803   CREATININE 0.98 08/11/2017 1644   CREATININE 1.13 07/09/2013 1803   CALCIUM 10.0 08/11/2017 1644   CALCIUM 9.6 07/09/2013 1803   PROT 7.6 08/11/2017 1644   PROT 6.9 11/21/2014 1137   ALBUMIN 4.4 08/11/2017 1644   ALBUMIN 4.3 11/21/2014 1137   AST 20 08/11/2017 1644   ALT 14 08/11/2017 1644   ALKPHOS 70 08/11/2017 1644   BILITOT 0.4 08/11/2017 1644   BILITOT 0.3 11/21/2014 1137   GFRNONAA >60 03/31/2017 0111   GFRNONAA 55 (L) 07/09/2013 1803   GFRAA >60 03/31/2017 0111   GFRAA >60 07/09/2013 1803   Lab Results  Component Value Date   CHOL 266 (H) 09/01/2016   HDL 62.20 09/01/2016   LDLCALC 178 (H) 09/01/2016   LDLDIRECT 206.5 01/24/2013   TRIG 130.0 09/01/2016   CHOLHDL 4 09/01/2016   Lab Results  Component Value Date   HGBA1C 5.9 09/01/2016   Lab Results  Component Value Date   VITAMINB12 843 09/22/2017   Lab Results  Component Value Date   TSH 1.64 08/11/2017      ASSESSMENT AND PLAN 63 y.o. year old female  has a past medical history of Allergic rhinitis, Allergy, Anxiety, Cataract, Colon polyps (08/2008), COPD (chronic obstructive pulmonary disease) ([facility]), Depression, Diabetes mellitus, Gastritis (08/2008), GERD (gastroesophageal reflux disease), History of shingles, Hypercholesteremia, Lung nodule, Menopausal disorder, Multiple sclerosis ([facility]), Neuromuscular disorder ([facility]), Plantar fasciitis, Seizure disorder ([facility]), Seizures ([facility]), Skin lesion (04/05/2017), Urinary incontinence, and Vertigo. here with:  1.  Multiple sclerosis  -Symptoms stable -Continue gabapentin -Last MRI brain was in 2019 with no changes from previous scan in 2016 -Encourage patient to follow-up with PCP for sudden weight change  Advised patient that if symptoms worsen or she develops new symptoms she should let us know.  She will follow-up in 6 months or sooner if needed.    Megan Millikan, MSN, NP-C 01/31/2019, 1:22  PM [facility] [address], [address] [place], [place] [address] [phone]   

## 2019-01-31 NOTE — Patient Instructions (Signed)
Your Plan:  Continue to monitor symptoms May consider repeat MRI in the future Call PCP to schedule appt about sudden weight loss If your symptoms worsen or you develop new symptoms please let us know.   Thank you for coming to see [place] at [facility]. I hope we have been able to provide you high quality care today.  You may receive a patient satisfaction survey over the next few weeks. We would appreciate your feedback and comments so that we may continue to improve ourselves and the health of our patients.

## 2019-02-09 ENCOUNTER — Telehealth: Payer: Self-pay

## 2019-02-09 NOTE — Telephone Encounter (Signed)
Pt has sinus pain under both eyes for few days;No H/A, pt has earache both ears,both ears feel stopped up and sometimes pt sees dried drainage in ears.cough is no worse than usual due to pt being a smoker. For 2 months pt has loose stools within 1 hr each time after she eats anything.pt has dry mouth and skin and some lightheadedness. Pt does not want to go to an UC. Offered pt a virtual appt at [facility] and pt said no. Pt could not understand why Cipro could not be called in. Advised Dr Glori Bickers would need to do virtual visit for possible sinus issue and then when that was better do visit for other issues. Pt said she has been coming here for 30 yrs and why in the hell can't we call in Cipro. I asked pt not to cuss and I explained to pt again why we could not call in Cipro. UC & ED precautions given and pt voiced understanding.Courtney scheduled virtual 30' appt for pt on 02/12/19 at 9 AM. FYI to Dr Glori Bickers.

## 2019-02-12 ENCOUNTER — Other Ambulatory Visit: Payer: Self-pay

## 2019-02-12 ENCOUNTER — Encounter: Payer: Self-pay | Admitting: Family Medicine

## 2019-02-12 ENCOUNTER — Ambulatory Visit (INDEPENDENT_AMBULATORY_CARE_PROVIDER_SITE_OTHER): Payer: Medicare HMO | Admitting: Family Medicine

## 2019-02-12 VITALS — Wt 154.5 lb

## 2019-02-12 DIAGNOSIS — J0100 Acute maxillary sinusitis, unspecified: Secondary | ICD-10-CM | POA: Diagnosis not present

## 2019-02-12 DIAGNOSIS — R634 Abnormal weight loss: Secondary | ICD-10-CM | POA: Insufficient documentation

## 2019-02-12 DIAGNOSIS — K580 Irritable bowel syndrome with diarrhea: Secondary | ICD-10-CM

## 2019-02-12 DIAGNOSIS — F17200 Nicotine dependence, unspecified, uncomplicated: Secondary | ICD-10-CM

## 2019-02-12 DIAGNOSIS — R69 Illness, unspecified: Secondary | ICD-10-CM | POA: Diagnosis not present

## 2019-02-12 MED ORDER — AMOXICILLIN-POT CLAVULANATE 875-125 MG PO TABS: 1.0000 | ORAL_TABLET | Freq: Two times a day (BID) | ORAL | 0 refills | Status: DC

## 2019-02-12 MED ORDER — PREDNISONE 10 MG PO TABS: ORAL_TABLET | ORAL | 0 refills | Status: DC

## 2019-02-12 NOTE — Assessment & Plan Note (Signed)
Worse diarrhea and urgency lately  Also wt loss   Will work on getting office appt when resp symptoms are better and /or she gets tested for covid

## 2019-02-12 NOTE — Progress Notes (Signed)
Virtual Visit via Video Note  I connected with [place] on 02/12/19 at  9:00 AM EST by a video enabled telemedicine application and verified that I am speaking with the correct person using two identifiers.  Location: Patient: home Provider: office    I discussed the limitations of evaluation and management by telemedicine and the availability of in person appointments. The patient expressed understanding and agreed to proceed.  Parties involved  in encounter: Patient   Karina [name] Treating physician    Loura Pardon MD  History of Present Illness: Pt presents with sinus problems   Has no energy Feels bad   Sinuses hurt- both above and below  Nasal drainage -cannot get anything out  Has post nasal drip and spitting that out- all clear  Productive cough- just from throat  Has a chronic cough due to smoking  A little worse than usual  Nose feels dry -but can get air through  No fever  (has a thermometer if she needs it)  No chills or aches   No new loss of taste or smell Some wheezing  No more sob than usual   A little light headed   Not eating much -when she sits down to eat - gets full easily  She does feel hungry initially  She has lost down to 154 lb -in the past 2 months   When she eats- gets diarrhea  Worse lately  No blood in her stool  No abd pain   MS is stable   No sick exposures known  Goes to the grocery store /wears a mask  Has not been tested for covid    Has a knot under R mandible area  Easily gets choked as well   Has lost some weight   Smoking status -about 1ppd  Has cut down a lot    Using astelin nasal spray and saline   occ nmt (duo neb) -not often  Stopped symbicort inhaler for regular use-was not doing much   Feels like she is dehydrated as well  Has to force herself to drink fluids Some pain in L flank -worse if she feels dehydrated    Patient Active Problem List   Diagnosis Date Noted  . Acute sinusitis 07/11/2018   . Rib pain 05/05/2018  . Seborrheic keratosis, inflamed 12/15/2017  . B12 deficiency 08/24/2017  . Left low back pain 08/24/2017  . Erosive osteoarthritis of both hands 09/09/2016  . Fatigue 09/07/2016  . Bilateral hand pain 09/07/2016  . Rash and nonspecific skin eruption 05/18/2016  . Cough 05/18/2016  . Routine general medical examination at a health care facility 05/06/2015  . Large breasts 05/06/2015  . Lumbar disc disease 11/01/2014  . Knee pain, bilateral 03/11/2014  . Thoracic back pain 11/30/2013  . Encounter for routine gynecological examination 01/31/2013  . Encounter for Medicare annual wellness exam 01/23/2013  . Chest pain 09/20/2012  . SOB (shortness of breath) 09/20/2012  . Dyspnea 09/13/2012  . Asthmatic bronchitis , chronic ([place]) 12/06/2011  . Chronic cough 11/22/2011  . Anxiety disorder 07/17/2009  . HEMORRHOIDS-EXTERNAL 11/07/2008  . IRRITABLE BOWEL SYNDROME 11/07/2008  . LUNG NODULE 10/14/2008  . Prediabetes 10/14/2008  . PERSONAL HX COLONIC POLYPS 10/08/2008  . Vitamin D deficiency 07/23/2008  . SYNCOPE, HX OF 06/01/2008  . FOOT SURGERY, HX OF 06/01/2008  . DILATION AND CURETTAGE, HX OF 06/01/2008  . OSTEOARTHRITIS, SHOULDER, RIGHT 03/22/2008  . ROTATOR CUFF SYNDROME, RIGHT 03/22/2008  . ARTHRALGIA 12/12/2007  .  Allergic rhinitis 08/01/2007  . Smoker 10/17/2006  . Peripheral neuropathy 10/17/2006  . FOOT PAIN, BILATERAL 10/17/2006  . HYPOGLYCEMIA 09/30/2006  . HYPERCHOLESTEROLEMIA 09/30/2006  . MULTIPLE SCLEROSIS 09/30/2006  . GERD 09/30/2006  . FIBROCYSTIC BREAST DISEASE 09/30/2006   Past Medical History:  Diagnosis Date  . Allergic rhinitis   . Allergy   . Anxiety    [place] [facility] [place], no per pt  . Cataract   . Colon polyps 08/2008   Adenomatous  . COPD (chronic obstructive pulmonary disease) ([place])   . Depression    no per pt  . Diabetes mellitus   . Gastritis 08/2008   H. pylori  . GERD (gastroesophageal reflux disease)   .  History of shingles    Left V1  . Hypercholesteremia   . Lung nodule   . Menopausal disorder   . Multiple sclerosis ([place])   . Neuromuscular disorder ([place])    Multiple sclerosis  . Plantar fasciitis   . Seizure disorder ([place])    single seizure/hypoglycemia  . Seizures ([place])    1 seizure in 2001 due to blood sugar dropping to 38, none since  . Skin lesion 04/05/2017  . Urinary incontinence   . Vertigo    Past Surgical History:  Procedure Laterality Date  . CERVICAL DISCECTOMY  2004   x 2, Anterior; Fusion C4-5, C5-6, C6-7, Dr. Kary Kos  . CHEST CT  11/2008   With small 4 mm nodule L lung base (rec re check in 1 year)  . CHEST CT  07/2009   Re check chest CT - lung nodule stable  . CHOLECYSTECTOMY    . COLONOSCOPY  08/2008   Polyps/ re check 5 yrs  . CT SINUS LTD W/O CM  02/2009   negative  . DILATION AND CURETTAGE OF UTERUS    . ESOPHAGOGASTRODUODENOSCOPY  08/2008   Erosive gastritis, h pylori (treated)  . FOOT SURGERY     left plantar fascial problem  . ROTATOR CUFF REPAIR     right  . TONSILLECTOMY    . TUBAL LIGATION    . UPPER GASTROINTESTINAL ENDOSCOPY     Social History   Tobacco Use  . Smoking status: Current Every Day Smoker    Years: 30.00    Types: Cigarettes  . Smokeless tobacco: Never Used  Substance Use Topics  . Alcohol use: No    Alcohol/week: 0.0 standard drinks  . Drug use: No   Family History  Problem Relation Age of Onset  . Migraines Mother   . Heart attack Mother   . Coronary artery disease Mother   . Coronary artery disease Father        6 bypasses   . Stroke Father   . Diabetes Father   . Renal Disease Father   . Colon cancer Maternal Grandfather 29  . Coronary artery disease Cousin   . Coronary artery disease Paternal Grandmother   . Lung cancer Paternal Aunt   . Multiple sclerosis Neg Hx   . Esophageal cancer Neg Hx   . Rectal cancer Neg Hx   . Stomach cancer Neg Hx   . Breast cancer Neg Hx    Allergies  Allergen  Reactions  . Percocet [Oxycodone-Acetaminophen] Shortness Of Breath    Felt like going to pass out and sweating  . Tecfidera [Dimethyl Fumarate] Shortness Of Breath, Palpitations, Rash and Cough  . Amitriptyline Hcl     REACTION: swelling and itching  . Atorvastatin     REACTION: elevated  LFT's  . Betaseron [Interferon Beta-1b]     Headache  . Clarithromycin     REACTION: reaction not known  . Duloxetine     REACTION: pain  . Levofloxacin     REACTION: Rash  . Pregabalin     REACTION: LE swelling  . Pseudoephedrine     REACTION: legs hurt  . Zoloft [Sertraline Hcl] Other (See Comments)    Headaches    Current Outpatient Medications on File Prior to Visit  Medication Sig Dispense Refill  . albuterol (VENTOLIN HFA) 108 (90 Base) MCG/ACT inhaler INHALE 2 PUFFS UP TO EVERY 4 HOURS AS NEEDED FOR WHEEZING 3 Inhaler 3  . ALPRAZolam (XANAX) 0.5 MG tablet Take 1 tablet (0.5 mg total) by mouth 2 (two) times daily as needed. For anxiety. 30 tablet 0  . azelastine (ASTELIN) 0.1 % nasal spray Place 2 sprays into both nostrils 2 (two) times daily as needed.  12  . Blood Glucose Monitoring Suppl (ACCU-CHEK AVIVA PLUS) w/Device KIT Check blood sugar once daily and as directed Dx R73.09 1 kit 0  . budesonide-formoterol (SYMBICORT) 160-4.5 MCG/ACT inhaler Inhale 2 puffs into the lungs 2 (two) times daily. (Patient taking differently: Inhale 2 puffs into the lungs daily as needed. ) 3 Inhaler 2  . CRESTOR 20 MG tablet Take 1 tablet (20 mg total) by mouth daily. (Patient taking differently: Take 10 mg by mouth daily. ) 90 tablet 3  . Diclofenac Sodium 3 % GEL Place 1 application onto the skin every 12 (twelve) hours as needed. 50 g 0  . gabapentin (NEURONTIN) 300 MG capsule TAKE 1 CAPSULE BY MOUTH TWICE DURING THE DAY AND 2 CAPSULES BY MOUTH AT NIGHT 360 capsule 4  . ipratropium-albuterol (DUONEB) 0.5-2.5 (3) MG/3ML SOLN TAKE 3 MLS BY NEBULIZATION EVERY 6 (SIX) HOURS AS NEEDED. 360 mL 0  .  pantoprazole (PROTONIX) 40 MG tablet TAKE 1 TABLET (40 MG TOTAL) BY MOUTH 2 (TWO) TIMES DAILY BEFORE A MEAL. (Patient not taking: Reported on 02/12/2019) 60 tablet 0   No current facility-administered medications on file prior to visit.     Review of Systems  Constitutional: Positive for malaise/fatigue and weight loss. Negative for chills and fever.  HENT: Positive for ear pain and sinus pain. Negative for congestion, ear discharge, nosebleeds and sore throat.   Eyes: Negative for blurred vision, discharge and redness.  Respiratory: Positive for cough, sputum production and wheezing. Negative for hemoptysis, shortness of breath and stridor.        Baseline cough and wheezing in a smoker  Cardiovascular: Negative for chest pain, palpitations and leg swelling.  Gastrointestinal: Positive for diarrhea. Negative for abdominal pain, blood in stool, constipation, melena, nausea and vomiting.       Urgent stools sometimes after eating  Early satiety  Musculoskeletal: Negative for myalgias.  Skin: Negative for rash.  Neurological: Positive for weakness and headaches. Negative for dizziness, sensory change and speech change.       MS with intermittent weakness-overall stable    Observations/Objective: Patient appears well, in no distress Weight is baseline  No facial swelling or asymmetry (appears edentulous)  Voice is mildly hoarse (no slurred speech) No obvious tremor or mobility impairment Voices tenderness when pressing on maxillary sinus areas of face with her hands Moving neck and UEs normally Able to hear the call well  No cough or shortness of breath during interview  Talkative and mentally sharp with no cognitive changes No skin changes on face or neck ,  no rash or pallor Affect is normal    Assessment and Plan: Problem List Items Addressed This Visit      Respiratory   Acute sinusitis - Primary    With maxillary and frontal sinus pain in setting of smoking and chronic cough   tx with augmentin and prednisone (this has helped before)  Side eff discussed  Enc better hydration as well as nasal saline  inst to call in a week when done with abx -if not significant imp will work on getting tested for covid       Relevant Medications   amoxicillin-clavulanate (AUGMENTIN) 875-125 MG tablet   predniSONE (DELTASONE) 10 MG tablet     Digestive   IRRITABLE BOWEL SYNDROME    Worse diarrhea and urgency lately  Also wt loss   Will work on getting office appt when resp symptoms are better and /or she gets tested for covid        Other   Smoker    With some chronic cough and wheeze  Has cut back to 1ppd and wants to quit Stopped regular use of symbicord since not very helpful         Weight loss    Significant wt loss due to early satiety  Also more loose /urgent stool in setting of IBS Wt loss is worrisome in light of smoking history Needs eval when we can have her in the office Currently tx for sinusitis and may also need covid testing if those symptoms do not resolve   C/o L flank pain when she does not drink enough water   She will call in a week to report: resp symptoms and then make further plan          Follow Up Instructions: Please keep trying to drink fluids Continue current allergy medicines and inhalers Keep working on quitting smoking  I sent augmentin and prednisone to the pharmacy to treat a sinus infection  Let us know in a week how you are If not improving we will need to get you tested for covid   At that point-when safe we will see if we can get you in the office to talk about weight loss and other issues    I discussed the assessment and treatment plan with the patient. The patient was provided an opportunity to ask questions and all were answered. The patient agreed with the plan and demonstrated an understanding of the instructions.   The patient was advised to call back or seek an in-person evaluation if the symptoms worsen  or if the condition fails to improve as anticipated.     Loura Pardon, MD

## 2019-02-12 NOTE — Assessment & Plan Note (Signed)
With maxillary and frontal sinus pain in setting of smoking and chronic cough  tx with augmentin and prednisone (this has helped before)  Side eff discussed  Enc better hydration as well as nasal saline  inst to call in a week when done with abx -if not significant imp will work on getting tested for covid

## 2019-02-12 NOTE — Assessment & Plan Note (Addendum)
Significant wt loss due to early satiety  Also more loose /urgent stool in setting of IBS Wt loss is worrisome in light of smoking history Needs eval when we can have her in the office Currently tx for sinusitis and may also need covid testing if those symptoms do not resolve   C/o L flank pain when she does not drink enough water   She will call in a week to report: resp symptoms and then make further plan

## 2019-02-12 NOTE — Assessment & Plan Note (Signed)
With some chronic cough and wheeze  Has cut back to 1ppd and wants to quit Stopped regular use of symbicord since not very helpful

## 2019-02-12 NOTE — Patient Instructions (Signed)
Please keep trying to drink fluids Continue current allergy medicines and inhalers Keep working on quitting smoking  I sent augmentin and prednisone to the pharmacy to treat a sinus infection  Let us know in a week how you are If not improving we will need to get you tested for covid   At that point-when safe we will see if we can get you in the office to talk about weight loss and other issues

## 2019-03-27 DIAGNOSIS — H538 Other visual disturbances: Secondary | ICD-10-CM | POA: Diagnosis not present

## 2019-03-27 DIAGNOSIS — G35 Multiple sclerosis: Secondary | ICD-10-CM | POA: Diagnosis not present

## 2019-04-07 DIAGNOSIS — G629 Polyneuropathy, unspecified: Secondary | ICD-10-CM | POA: Diagnosis not present

## 2019-04-07 DIAGNOSIS — H269 Unspecified cataract: Secondary | ICD-10-CM | POA: Diagnosis not present

## 2019-04-07 DIAGNOSIS — R32 Unspecified urinary incontinence: Secondary | ICD-10-CM | POA: Diagnosis not present

## 2019-04-07 DIAGNOSIS — Z9181 History of falling: Secondary | ICD-10-CM | POA: Diagnosis not present

## 2019-04-07 DIAGNOSIS — R030 Elevated blood-pressure reading, without diagnosis of hypertension: Secondary | ICD-10-CM | POA: Diagnosis not present

## 2019-04-07 DIAGNOSIS — Z885 Allergy status to narcotic agent status: Secondary | ICD-10-CM | POA: Diagnosis not present

## 2019-04-07 DIAGNOSIS — J449 Chronic obstructive pulmonary disease, unspecified: Secondary | ICD-10-CM | POA: Diagnosis not present

## 2019-04-07 DIAGNOSIS — G8929 Other chronic pain: Secondary | ICD-10-CM | POA: Diagnosis not present

## 2019-04-07 DIAGNOSIS — M1990 Unspecified osteoarthritis, unspecified site: Secondary | ICD-10-CM | POA: Diagnosis not present

## 2019-04-07 DIAGNOSIS — R69 Illness, unspecified: Secondary | ICD-10-CM | POA: Diagnosis not present

## 2019-04-20 ENCOUNTER — Encounter: Payer: Self-pay | Admitting: Family Medicine

## 2019-04-20 ENCOUNTER — Ambulatory Visit (INDEPENDENT_AMBULATORY_CARE_PROVIDER_SITE_OTHER)
Admission: RE | Admit: 2019-04-20 | Discharge: 2019-04-20 | Disposition: A | Discharge status: Home / Self Care | Payer: Medicare HMO | Source: Ambulatory Visit | Attending: Family Medicine | Admitting: Family Medicine

## 2019-04-20 ENCOUNTER — Other Ambulatory Visit: Payer: Self-pay

## 2019-04-20 ENCOUNTER — Ambulatory Visit (INDEPENDENT_AMBULATORY_CARE_PROVIDER_SITE_OTHER): Payer: Medicare HMO | Admitting: Family Medicine

## 2019-04-20 ENCOUNTER — Telehealth: Payer: Self-pay | Admitting: *Deleted

## 2019-04-20 VITALS — BP 118/72 | HR 84 | Temp 96.9°F | Ht 61.0 in | Wt 150.4 lb

## 2019-04-20 DIAGNOSIS — R630 Anorexia: Secondary | ICD-10-CM

## 2019-04-20 DIAGNOSIS — F17200 Nicotine dependence, unspecified, uncomplicated: Secondary | ICD-10-CM

## 2019-04-20 DIAGNOSIS — E538 Deficiency of other specified B group vitamins: Secondary | ICD-10-CM | POA: Diagnosis not present

## 2019-04-20 DIAGNOSIS — G35 Multiple sclerosis: Secondary | ICD-10-CM

## 2019-04-20 DIAGNOSIS — R634 Abnormal weight loss: Secondary | ICD-10-CM

## 2019-04-20 DIAGNOSIS — R350 Frequency of micturition: Secondary | ICD-10-CM

## 2019-04-20 DIAGNOSIS — E559 Vitamin D deficiency, unspecified: Secondary | ICD-10-CM | POA: Diagnosis not present

## 2019-04-20 DIAGNOSIS — R221 Localized swelling, mass and lump, neck: Secondary | ICD-10-CM

## 2019-04-20 DIAGNOSIS — R69 Illness, unspecified: Secondary | ICD-10-CM | POA: Diagnosis not present

## 2019-04-20 DIAGNOSIS — R7303 Prediabetes: Secondary | ICD-10-CM | POA: Diagnosis not present

## 2019-04-20 DIAGNOSIS — Z1231 Encounter for screening mammogram for malignant neoplasm of breast: Secondary | ICD-10-CM | POA: Diagnosis not present

## 2019-04-20 DIAGNOSIS — R5382 Chronic fatigue, unspecified: Secondary | ICD-10-CM | POA: Diagnosis not present

## 2019-04-20 DIAGNOSIS — R911 Solitary pulmonary nodule: Secondary | ICD-10-CM

## 2019-04-20 DIAGNOSIS — J449 Chronic obstructive pulmonary disease, unspecified: Secondary | ICD-10-CM

## 2019-04-20 LAB — POC URINALSYSI DIPSTICK (AUTOMATED)
Bilirubin, UA: NEGATIVE
Blood, UA: NEGATIVE
Glucose, UA: NEGATIVE
Ketones, UA: NEGATIVE
Leukocytes, UA: NEGATIVE
Nitrite, UA: NEGATIVE
Protein, UA: NEGATIVE
Spec Grav, UA: >=1.03 — AB (ref 1.010–1.025)
Urobilinogen, UA: 0.2 E.U./dL
pH, UA: 6 (ref 5.0–8.0)

## 2019-04-20 NOTE — Patient Instructions (Signed)
Call and schedule your mammogram at the [facility] center Chest xray today  Urine sample and labs today  I placed a referral for new ENT- our office will call you to set that up

## 2019-04-20 NOTE — Telephone Encounter (Signed)
Left VM requesting pt to call the office back regarding urine sample  

## 2019-04-20 NOTE — Telephone Encounter (Signed)
Karina [name] advised pt of UA results when she advise her of referral

## 2019-04-20 NOTE — Progress Notes (Signed)
Subjective:    Patient ID: Karina [name], female    DOB: Feb 24, 1956, 64 y.o.   MRN: [id number]  This visit occurred during the SARS-CoV-2 public health emergency.  Safety protocols were in place, including screening questions prior to the visit, additional usage of staff PPE, and extensive cleaning of exam room while observing appropriate contact time as indicated for disinfecting solutions.    HPI Pt here with knot in her neck with wt loss and no appetite   Wt Readings from Last 3 Encounters:  04/20/19 150 lb 7 oz (68.2 kg)  02/12/19 154 lb 8 oz (70.1 kg)  01/31/19 154 lb 12.8 oz (70.2 kg)   28.42 kg/m   Highest wt was 200 lb in 1/19  She started loosing appetite in oct of last year  She has diarrhea almost every time she eats or drinks  Has pain in L back   Forces herself to eat  Lives off of pb crackers   Knot in neck- R submandibular - makes her jaw hurt- that was in December  Sleeping a lot   Saw ENT a while back-did a scope in throat  ? If had lump then   Urinates frequently  Stays thirsty  Feels dry all over   She was tx for sinusitis last mo with augmentin and prednisone   Current smoker -30 years More trouble breathing but not wheezing (feels like it is in her throat)      Has had lung nodules in the past Last CT chest 2011:  CT Chest W Contrast (Accession [id number]) (Order [id number]) Imaging Date: 07/22/2009 Department: [facility] [address] Ordering/Authorizing: Abbrielle Batts, Wynelle Fanny, MD  Exam Status  Status  Final [99]  PACS Intelerad Image Link  Show images for CT Chest W Contrast  Study Result  Clinical Data: Reassess left lower lobe nodule.  Smoker.   CT CHEST WITH CONTRAST   Technique:  Multidetector CT imaging of the chest was performed following the standard protocol during bolus administration of intravenous contrast.   Contrast: 80 ml Omnipaque-300 IV.   Comparison: CT 11/12/2008.   Findings: There are no  pathologically enlarged lymph nodes. Incidental lipomatous hypertrophy of the interatrial septum is again noted.  Mild fatty infiltration of the liver. Mild degenerative changes of the T-spine.   The 4 - 5 mm subpleural nodule in the left lower lobe (image 39) is stable.   IMPRESSION: Specifically, the left lower lobe nodule is stable.  Provider: Cristin    Chest xray a year ago: DG Chest 2 View (Accession [id number]) (Order [id number]) Imaging Date: 05/05/2018 Department: [facility] Released By: Ellamae Sia Authorizing: Eliyana Pagliaro, Wynelle Fanny, MD  Exam Status  Status  Final [99]  PACS Intelerad Image Link  Show images for DG Chest 2 View  Study Result  CLINICAL DATA:  Bilateral lower rib pain.  Chronic cough.  Smoker.  EXAM: CHEST - 2 VIEW  COMPARISON:  03/31/2017.  FINDINGS: Normal sized heart. Clear lungs. The lungs are mildly hyperexpanded. Minimal thoracic spine degenerative changes. Cholecystectomy clips. Cervical spine fixation hardware.  IMPRESSION: No acute abnormality.  Mild changes of COPD.   Electronically Signed   By: Claudie Revering M.D.   On: 05/05/2018 12:09     Mammogram was 6/10  Self breast exam no lumps  Frequent urination UA is clear today   Patient Active Problem List   Diagnosis Date Noted  . Frequent urination 04/20/2019  . Localized swelling, mass  and lump, neck 04/20/2019  . Decreased appetite 04/20/2019  . Screening mammogram, encounter for 04/20/2019  . Weight loss 02/12/2019  . Rib pain 05/05/2018  . Seborrheic keratosis, inflamed 12/15/2017  . B12 deficiency 08/24/2017  . Left low back pain 08/24/2017  . Erosive osteoarthritis of both hands 09/09/2016  . Fatigue 09/07/2016  . Bilateral hand pain 09/07/2016  . Routine general medical examination at a health care facility 05/06/2015  . Large breasts 05/06/2015  . Lumbar disc disease 11/01/2014  . Knee pain, bilateral 03/11/2014  . Thoracic  back pain 11/30/2013  . Encounter for routine gynecological examination 01/31/2013  . Encounter for Medicare annual wellness exam 01/23/2013  . Chest pain 09/20/2012  . SOB (shortness of breath) 09/20/2012  . Dyspnea 09/13/2012  . Asthmatic bronchitis , chronic ([place]) 12/06/2011  . Chronic cough 11/22/2011  . Anxiety disorder 07/17/2009  . HEMORRHOIDS-EXTERNAL 11/07/2008  . IRRITABLE BOWEL SYNDROME 11/07/2008  . Lung nodule 10/14/2008  . Prediabetes 10/14/2008  . PERSONAL HX COLONIC POLYPS 10/08/2008  . Vitamin D deficiency 07/23/2008  . SYNCOPE, HX OF 06/01/2008  . FOOT SURGERY, HX OF 06/01/2008  . DILATION AND CURETTAGE, HX OF 06/01/2008  . OSTEOARTHRITIS, SHOULDER, RIGHT 03/22/2008  . ROTATOR CUFF SYNDROME, RIGHT 03/22/2008  . ARTHRALGIA 12/12/2007  . Allergic rhinitis 08/01/2007  . Smoker 10/17/2006  . Peripheral neuropathy 10/17/2006  . FOOT PAIN, BILATERAL 10/17/2006  . HYPOGLYCEMIA 09/30/2006  . HYPERCHOLESTEROLEMIA 09/30/2006  . MULTIPLE SCLEROSIS 09/30/2006  . GERD 09/30/2006  . FIBROCYSTIC BREAST DISEASE 09/30/2006   Past Medical History:  Diagnosis Date  . Allergic rhinitis   . Allergy   . Anxiety    [place] [facility] [place], no per pt  . Cataract   . Colon polyps 08/2008   Adenomatous  . COPD (chronic obstructive pulmonary disease) ([place])   . Depression    no per pt  . Diabetes mellitus   . Gastritis 08/2008   H. pylori  . GERD (gastroesophageal reflux disease)   . History of shingles    Left V1  . Hypercholesteremia   . Lung nodule   . Menopausal disorder   . Multiple sclerosis ([place])   . Neuromuscular disorder ([place])    Multiple sclerosis  . Plantar fasciitis   . Seizure disorder ([place])    single seizure/hypoglycemia  . Seizures ([place])    1 seizure in 2001 due to blood sugar dropping to 38, none since  . Skin lesion 04/05/2017  . Urinary incontinence   . Vertigo    Past Surgical History:  Procedure Laterality Date  . CERVICAL DISCECTOMY  2004    x 2, Anterior; Fusion C4-5, C5-6, C6-7, Dr. Kary Kos  . CHEST CT  11/2008   With small 4 mm nodule L lung base (rec re check in 1 year)  . CHEST CT  07/2009   Re check chest CT - lung nodule stable  . CHOLECYSTECTOMY    . COLONOSCOPY  08/2008   Polyps/ re check 5 yrs  . CT SINUS LTD W/O CM  02/2009   negative  . DILATION AND CURETTAGE OF UTERUS    . ESOPHAGOGASTRODUODENOSCOPY  08/2008   Erosive gastritis, h pylori (treated)  . FOOT SURGERY     left plantar fascial problem  . ROTATOR CUFF REPAIR     right  . TONSILLECTOMY    . TUBAL LIGATION    . UPPER GASTROINTESTINAL ENDOSCOPY     Social History   Tobacco Use  . Smoking status: Current Every  Day Smoker    Years: 30.00    Types: Cigarettes  . Smokeless tobacco: Never Used  Substance Use Topics  . Alcohol use: No    Alcohol/week: 0.0 standard drinks  . Drug use: No   Family History  Problem Relation Age of Onset  . Migraines Mother   . Heart attack Mother   . Coronary artery disease Mother   . Coronary artery disease Father        6 bypasses   . Stroke Father   . Diabetes Father   . Renal Disease Father   . Colon cancer Maternal Grandfather 43  . Coronary artery disease Cousin   . Coronary artery disease Paternal Grandmother   . Lung cancer Paternal Aunt   . Multiple sclerosis Neg Hx   . Esophageal cancer Neg Hx   . Rectal cancer Neg Hx   . Stomach cancer Neg Hx   . Breast cancer Neg Hx    Allergies  Allergen Reactions  . Percocet [Oxycodone-Acetaminophen] Shortness Of Breath    Felt like going to pass out and sweating  . Tecfidera [Dimethyl Fumarate] Shortness Of Breath, Palpitations, Rash and Cough  . Amitriptyline Hcl     REACTION: swelling and itching  . Atorvastatin     REACTION: elevated LFT's  . Betaseron [Interferon Beta-1b]     Headache  . Clarithromycin     REACTION: reaction not known  . Duloxetine     REACTION: pain  . Levofloxacin     REACTION: Rash  . Pregabalin     REACTION: LE  swelling  . Pseudoephedrine     REACTION: legs hurt  . Zoloft [Sertraline Hcl] Other (See Comments)    Headaches    Current Outpatient Medications on File Prior to Visit  Medication Sig Dispense Refill  . albuterol (VENTOLIN HFA) 108 (90 Base) MCG/ACT inhaler INHALE 2 PUFFS UP TO EVERY 4 HOURS AS NEEDED FOR WHEEZING 3 Inhaler 3  . ALPRAZolam (XANAX) 0.5 MG tablet Take 1 tablet (0.5 mg total) by mouth 2 (two) times daily as needed. For anxiety. 30 tablet 0  . azelastine (ASTELIN) 0.1 % nasal spray Place 2 sprays into both nostrils 2 (two) times daily as needed.  12  . Blood Glucose Monitoring Suppl (ACCU-CHEK AVIVA PLUS) w/Device KIT Check blood sugar once daily and as directed Dx R73.09 1 kit 0  . budesonide-formoterol (SYMBICORT) 160-4.5 MCG/ACT inhaler Inhale 2 puffs into the lungs 2 (two) times daily. (Patient taking differently: Inhale 2 puffs into the lungs daily as needed. ) 3 Inhaler 2  . Diclofenac Sodium 3 % GEL Place 1 application onto the skin every 12 (twelve) hours as needed. 50 g 0  . gabapentin (NEURONTIN) 300 MG capsule TAKE 1 CAPSULE BY MOUTH TWICE DURING THE DAY AND 2 CAPSULES BY MOUTH AT NIGHT 360 capsule 4  . ipratropium-albuterol (DUONEB) 0.5-2.5 (3) MG/3ML SOLN TAKE 3 MLS BY NEBULIZATION EVERY 6 (SIX) HOURS AS NEEDED. 360 mL 0   No current facility-administered medications on file prior to visit.    Review of Systems  Constitutional: Positive for appetite change, fatigue and unexpected weight change. Negative for activity change and fever.  HENT: Negative for congestion, ear pain, rhinorrhea, sinus pressure and sore throat.        Lump under R jaw Globus sensation  Eyes: Negative for pain, redness and visual disturbance.  Respiratory: Positive for cough. Negative for shortness of breath and wheezing.        Baseline smoker's cough  Cardiovascular: Negative for chest pain and palpitations.  Gastrointestinal: Negative for abdominal pain, blood in stool, constipation  and diarrhea.  Endocrine: Negative for polydipsia and polyuria.  Genitourinary: Positive for frequency and urgency. Negative for dysuria and hematuria.  Musculoskeletal: Positive for arthralgias and back pain. Negative for myalgias.  Skin: Negative for pallor and rash.  Allergic/Immunologic: Negative for environmental allergies.  Neurological: Positive for dizziness. Negative for syncope and headaches.       Occ dizziness  Hematological: Negative for adenopathy. Does not bruise/bleed easily.  Psychiatric/Behavioral: Negative for decreased concentration and dysphoric mood. The patient is not nervous/anxious.        Pt denies depression        Objective:   Physical Exam Constitutional:      General: She is not in acute distress.    Appearance: Normal appearance. She is well-developed and normal weight. She is not ill-appearing or diaphoretic.  HENT:     Head: Normocephalic and atraumatic.     Right Ear: Tympanic membrane, ear canal and external ear normal. There is no impacted cerumen.     Left Ear: Tympanic membrane, ear canal and external ear normal. There is no impacted cerumen.     Nose: Nose normal.     Mouth/Throat:     Mouth: Mucous membranes are moist.     Pharynx: Oropharynx is clear. No posterior oropharyngeal erythema.     Comments: Poor dentition  No obv dental abscess or infection  Few teeth left Eyes:     General: No scleral icterus.       Right eye: No discharge.        Left eye: No discharge.     Extraocular Movements: Extraocular movements intact.     Conjunctiva/sclera: Conjunctivae normal.     Pupils: Pupils are equal, round, and reactive to light.  Neck:     Thyroid: No thyromegaly.     Vascular: No carotid bruit or JVD.     Comments: Fullness felt under R mandible that is tender per pt  No other adenopathy/lumps felt Cardiovascular:     Rate and Rhythm: Normal rate and regular rhythm.     Heart sounds: Normal heart sounds. No gallop.   Pulmonary:      Effort: Pulmonary effort is normal. No respiratory distress.     Breath sounds: Wheezing present. No rales.     Comments: Diffusely distant bs  Mild end exp wheezes Abdominal:     General: Bowel sounds are normal. There is no distension or abdominal bruit.     Palpations: Abdomen is soft. There is no mass.     Tenderness: There is no abdominal tenderness. There is no right CVA tenderness, left CVA tenderness, guarding or rebound.     Hernia: No hernia is present.  Musculoskeletal:     Cervical back: Normal range of motion and neck supple. Tenderness present.     Right lower leg: No edema.     Left lower leg: No edema.  Skin:    General: Skin is warm and dry.     Coloration: Skin is not pale.     Findings: No erythema or rash.  Neurological:     Mental Status: She is alert.     Motor: No weakness.     Coordination: Coordination abnormal.     Gait: Gait normal.     Deep Tendon Reflexes: Reflexes are normal and symmetric.     Comments: Baseline MS - uncoordinated  Psychiatric:  Comments: Pt seems generally down and frustrated            Assessment & Plan:   Problem List Items Addressed This Visit      Respiratory   Asthmatic bronchitis , chronic ([place])    Continues to smoke Some wheezing on exam Pt does not feel sob        Nervous and Auditory   MULTIPLE SCLEROSIS    Per pt stable  Followed by neurology        Other   Vitamin D deficiency    D level today Pt has been taking it      Relevant Orders   CBC with Differential/Platelet (Completed)   Comprehensive metabolic panel (Completed)   Hemoglobin A1c (Completed)   TSH (Completed)   VITAMIN D 25 Hydroxy (Vit-D Deficiency, Fractures) (Completed)   Vitamin B12 (Completed)   Smoker   Relevant Orders   DG Chest 2 View (Completed)   CBC with Differential/Platelet (Completed)   Comprehensive metabolic panel (Completed)   Hemoglobin A1c (Completed)   TSH (Completed)   VITAMIN D 25 Hydroxy (Vit-D  Deficiency, Fractures) (Completed)   Vitamin B12 (Completed)   Lung nodule    4-5 mm subpleural LLL 2011 stable      Prediabetes    A1c today Fatigued and loosing wt      Relevant Orders   CBC with Differential/Platelet (Completed)   Comprehensive metabolic panel (Completed)   Hemoglobin A1c (Completed)   TSH (Completed)   VITAMIN D 25 Hydroxy (Vit-D Deficiency, Fractures) (Completed)   Vitamin B12 (Completed)   Fatigue    With weight loss and general malaise Labs today ua neg  cxr pending       Relevant Orders   CBC with Differential/Platelet (Completed)   Comprehensive metabolic panel (Completed)   Hemoglobin A1c (Completed)   TSH (Completed)   VITAMIN D 25 Hydroxy (Vit-D Deficiency, Fractures) (Completed)   Vitamin B12 (Completed)   B12 deficiency    Level today  Pt is fatigued      Relevant Orders   CBC with Differential/Platelet (Completed)   Comprehensive metabolic panel (Completed)   Hemoglobin A1c (Completed)   TSH (Completed)   VITAMIN D 25 Hydroxy (Vit-D Deficiency, Fractures) (Completed)   Vitamin B12 (Completed)   Weight loss    With fatigue and malaise in a smoker  Has been loosing since 1/19 (top wt 200 lb)  Per pt no appetite since October and she has to make herself eat cxr today-pend rev ua clear  Labs pending ENT ref made for feeling of lump under jaw  Mammogram planned       Relevant Orders   DG Chest 2 View (Completed)   CBC with Differential/Platelet (Completed)   Comprehensive metabolic panel (Completed)   Hemoglobin A1c (Completed)   TSH (Completed)   VITAMIN D 25 Hydroxy (Vit-D Deficiency, Fractures) (Completed)   Vitamin B12 (Completed)   Frequent urination    Clear ua Pending labs to check glucose and a1c      Relevant Orders   CBC with Differential/Platelet (Completed)   Comprehensive metabolic panel (Completed)   Hemoglobin A1c (Completed)   TSH (Completed)   VITAMIN D 25 Hydroxy (Vit-D Deficiency, Fractures)  (Completed)   Vitamin B12 (Completed)   POCT Urinalysis Dipstick (Automated) (Completed)   Localized swelling, mass and lump, neck - Primary    Pt feels lump in neck /jaw under R side under mandible that is uncomfortable and getting bigger  Ref to ENT Also  globus sensation (w/u by Dr Lucia Gaskins in the past and per pt no cause found)        Relevant Orders   Ambulatory referral to ENT   CBC with Differential/Platelet (Completed)   Comprehensive metabolic panel (Completed)   Hemoglobin A1c (Completed)   TSH (Completed)   VITAMIN D 25 Hydroxy (Vit-D Deficiency, Fractures) (Completed)   Vitamin B12 (Completed)   Decreased appetite    Per pt since oct  She has been loosing weight however since jan 2019 No loss of taste or smell       Relevant Orders   CBC with Differential/Platelet (Completed)   Comprehensive metabolic panel (Completed)   Hemoglobin A1c (Completed)   TSH (Completed)   VITAMIN D 25 Hydroxy (Vit-D Deficiency, Fractures) (Completed)   Vitamin B12 (Completed)   Screening mammogram, encounter for    Nl breast exam  Enc self exams Mammogram ordered and phone number given to schedule      Relevant Orders   MM 3D SCREEN BREAST BILATERAL   CBC with Differential/Platelet (Completed)   Comprehensive metabolic panel (Completed)   Hemoglobin A1c (Completed)   TSH (Completed)   VITAMIN D 25 Hydroxy (Vit-D Deficiency, Fractures) (Completed)   Vitamin B12 (Completed)

## 2019-04-21 LAB — CBC WITH DIFFERENTIAL/PLATELET
Absolute Monocytes: 576 cells/uL (ref 200–950)
Basophils Absolute: 106 cells/uL (ref 0–200)
Basophils Relative: 1.1 %
Eosinophils Absolute: 202 cells/uL (ref 15–500)
Eosinophils Relative: 2.1 %
HCT: 46.9 % — ABNORMAL HIGH (ref 35.0–45.0)
Hemoglobin: 16 g/dL — ABNORMAL HIGH (ref 11.7–15.5)
Lymphs Abs: 3389 cells/uL (ref 850–3900)
MCH: 32 pg (ref 27.0–33.0)
MCHC: 34.1 g/dL (ref 32.0–36.0)
MCV: 93.8 fL (ref 80.0–100.0)
MPV: 11.9 fL (ref 7.5–12.5)
Monocytes Relative: 6 %
Neutro Abs: 5328 cells/uL (ref 1500–7800)
Neutrophils Relative %: 55.5 %
Platelets: 227 10*3/uL (ref 140–400)
RBC: 5 10*6/uL (ref 3.80–5.10)
RDW: 12.3 % (ref 11.0–15.0)
Total Lymphocyte: 35.3 %
WBC: 9.6 10*3/uL (ref 3.8–10.8)

## 2019-04-21 LAB — COMPREHENSIVE METABOLIC PANEL
AG Ratio: 1.5 (calc) (ref 1.0–2.5)
ALT: 13 U/L (ref 6–29)
AST: 18 U/L (ref 10–35)
Albumin: 4.4 g/dL (ref 3.6–5.1)
Alkaline phosphatase (APISO): 75 U/L (ref 37–153)
BUN/Creatinine Ratio: 10 (calc) (ref 6–22)
BUN: 12 mg/dL (ref 7–25)
CO2: 30 mmol/L (ref 20–32)
Calcium: 10.3 mg/dL (ref 8.6–10.4)
Chloride: 103 mmol/L (ref 98–110)
Creat: 1.2 mg/dL — ABNORMAL HIGH (ref 0.50–0.99)
Globulin: 3 g/dL (calc) (ref 1.9–3.7)
Glucose, Bld: 87 mg/dL (ref 65–99)
Potassium: 5.1 mmol/L (ref 3.5–5.3)
Sodium: 142 mmol/L (ref 135–146)
Total Bilirubin: 0.3 mg/dL (ref 0.2–1.2)
Total Protein: 7.4 g/dL (ref 6.1–8.1)

## 2019-04-21 LAB — VITAMIN D 25 HYDROXY (VIT D DEFICIENCY, FRACTURES): Vit D, 25-Hydroxy: 27 ng/mL — ABNORMAL LOW (ref 30–100)

## 2019-04-21 LAB — HEMOGLOBIN A1C
Hgb A1c MFr Bld: 5.4 % of total Hgb (ref <5.7)
Mean Plasma Glucose: 108 (calc)
eAG (mmol/L): 6 (calc)

## 2019-04-21 LAB — VITAMIN B12: Vitamin B-12: 376 pg/mL (ref 200–1100)

## 2019-04-21 LAB — TSH: TSH: 1.66 mIU/L (ref 0.40–4.50)

## 2019-04-21 NOTE — Assessment & Plan Note (Signed)
D level today Pt has been taking it

## 2019-04-21 NOTE — Assessment & Plan Note (Signed)
4-5 mm subpleural LLL 2011 stable

## 2019-04-21 NOTE — Assessment & Plan Note (Signed)
Per pt since oct  She has been loosing weight however since jan 2019 No loss of taste or smell

## 2019-04-21 NOTE — Assessment & Plan Note (Addendum)
With fatigue and malaise in a smoker  Has been loosing since 1/19 (top wt 200 lb)  Per pt no appetite since October and she has to make herself eat cxr today-pend rev ua clear  Labs pending ENT ref made for feeling of lump under jaw  Mammogram planned

## 2019-04-21 NOTE — Assessment & Plan Note (Signed)
Level today  Pt is fatigued

## 2019-04-21 NOTE — Assessment & Plan Note (Signed)
Pt feels lump in neck /jaw under R side under mandible that is uncomfortable and getting bigger  Ref to ENT Also globus sensation (w/u by Dr Lucia Gaskins in the past and per pt no cause found)

## 2019-04-21 NOTE — Assessment & Plan Note (Signed)
Per pt stable  Followed by neurology

## 2019-04-21 NOTE — Assessment & Plan Note (Signed)
A1c today Fatigued and loosing wt

## 2019-04-21 NOTE — Assessment & Plan Note (Signed)
Clear ua Pending labs to check glucose and a1c

## 2019-04-21 NOTE — Assessment & Plan Note (Signed)
Nl breast exam  Enc self exams Mammogram ordered and phone number given to schedule

## 2019-04-21 NOTE — Assessment & Plan Note (Signed)
Continues to smoke Some wheezing on exam Pt does not feel sob

## 2019-04-21 NOTE — Assessment & Plan Note (Signed)
With weight loss and general malaise Labs today ua neg  cxr pending

## 2019-04-23 ENCOUNTER — Telehealth: Payer: Self-pay | Admitting: *Deleted

## 2019-04-23 NOTE — Telephone Encounter (Signed)
Left VM requesting pt to call the office back regarding lab results  

## 2019-04-24 ENCOUNTER — Other Ambulatory Visit: Payer: Self-pay | Admitting: Family Medicine

## 2019-04-24 DIAGNOSIS — R7989 Other specified abnormal findings of blood chemistry: Secondary | ICD-10-CM | POA: Insufficient documentation

## 2019-04-24 DIAGNOSIS — E559 Vitamin D deficiency, unspecified: Secondary | ICD-10-CM

## 2019-04-24 MED ORDER — ERGOCALCIFEROL 1.25 MG (50000 UT) PO CAPS: 50000.0000 [IU] | ORAL_CAPSULE | ORAL | 0 refills | Status: DC

## 2019-04-24 NOTE — Telephone Encounter (Signed)
Her 5000 iu daily is not quite getting level up  I pended px for ergocalciferol to take weekly for 12 weeks -please send to pharmacy of choice  Continue the 5000 with that   Re check D level in 3 months please

## 2019-04-24 NOTE — Telephone Encounter (Signed)
-----   Message from Tammi Sou, [place] sent at 04/24/2019 12:44 PM EST ----- Pt notified of lab results and Dr. Marliss Coots comments. Pt said she takes 5000 units of vitamin D daily,

## 2019-04-24 NOTE — Telephone Encounter (Signed)
That is a test done usually by an oncologist when someone is diagnosed with cancer to see if/where it is spread.  That is not a test I can order.   Next- I wanted her to see ENT -referral was done Also get a mammogram-has she scheduled her appt yet?

## 2019-04-24 NOTE — Telephone Encounter (Signed)
Addressed through lab results  

## 2019-04-24 NOTE — Telephone Encounter (Signed)
Pt notified of Dr. Marliss Coots comments. Rx sent to pharmacy and lab appt scheduled  Pt said she had another question. She wants a PET scan. I did clarify she doesn't want a CT scan she wants a full body PET scan. Please advise

## 2019-04-24 NOTE — Telephone Encounter (Signed)
Pt notified of Dr. Marliss Coots comments. Pt said she hasn't gotten her mammogram scheduled yet but she will tomorrow

## 2019-05-03 ENCOUNTER — Ambulatory Visit: Payer: Medicare HMO

## 2019-05-07 DIAGNOSIS — R221 Localized swelling, mass and lump, neck: Secondary | ICD-10-CM | POA: Diagnosis not present

## 2019-05-07 DIAGNOSIS — R220 Localized swelling, mass and lump, head: Secondary | ICD-10-CM | POA: Diagnosis not present

## 2019-05-16 ENCOUNTER — Other Ambulatory Visit: Payer: Self-pay | Admitting: Otolaryngology

## 2019-05-16 DIAGNOSIS — R221 Localized swelling, mass and lump, neck: Secondary | ICD-10-CM

## 2019-05-16 DIAGNOSIS — R220 Localized swelling, mass and lump, head: Secondary | ICD-10-CM

## 2019-05-18 ENCOUNTER — Ambulatory Visit
Admission: RE | Admit: 2019-05-18 | Discharge: 2019-05-18 | Disposition: A | Discharge status: Home / Self Care | Payer: Medicare HMO | Source: Ambulatory Visit | Attending: Family Medicine | Admitting: Family Medicine

## 2019-05-18 ENCOUNTER — Other Ambulatory Visit: Payer: Self-pay

## 2019-05-18 DIAGNOSIS — Z1231 Encounter for screening mammogram for malignant neoplasm of breast: Secondary | ICD-10-CM

## 2019-05-29 ENCOUNTER — Other Ambulatory Visit: Payer: Self-pay

## 2019-05-29 ENCOUNTER — Ambulatory Visit
Admission: RE | Admit: 2019-05-29 | Discharge: 2019-05-29 | Disposition: A | Discharge status: Home / Self Care | Payer: Medicare HMO | Source: Ambulatory Visit | Attending: Otolaryngology | Admitting: Otolaryngology

## 2019-05-29 DIAGNOSIS — R221 Localized swelling, mass and lump, neck: Secondary | ICD-10-CM | POA: Diagnosis not present

## 2019-05-29 DIAGNOSIS — R220 Localized swelling, mass and lump, head: Secondary | ICD-10-CM

## 2019-05-29 MED ORDER — IOPAMIDOL (ISOVUE-300) INJECTION 61%
75.0000 mL | Freq: Once | INTRAVENOUS | Status: AC | PRN
  Administered 2019-05-29: 75 mL via INTRAVENOUS

## 2019-06-04 NOTE — Telephone Encounter (Signed)
Per chart review tab pt had appt on 02/12/20.

## 2019-07-10 ENCOUNTER — Other Ambulatory Visit: Payer: Self-pay | Admitting: Family Medicine

## 2019-07-24 ENCOUNTER — Other Ambulatory Visit (INDEPENDENT_AMBULATORY_CARE_PROVIDER_SITE_OTHER): Payer: Medicare HMO

## 2019-07-24 ENCOUNTER — Other Ambulatory Visit: Payer: Self-pay

## 2019-07-24 DIAGNOSIS — R7989 Other specified abnormal findings of blood chemistry: Secondary | ICD-10-CM

## 2019-07-24 DIAGNOSIS — E559 Vitamin D deficiency, unspecified: Secondary | ICD-10-CM

## 2019-07-24 LAB — BASIC METABOLIC PANEL
BUN: 13 mg/dL (ref 6–23)
CO2: 31 mEq/L (ref 19–32)
Calcium: 9.6 mg/dL (ref 8.4–10.5)
Chloride: 99 mEq/L (ref 96–112)
Creatinine, Ser: 0.91 mg/dL (ref 0.40–1.20)
GFR: 62.74 mL/min (ref >60.00)
Glucose, Bld: 95 mg/dL (ref 70–99)
Potassium: 4.3 mEq/L (ref 3.5–5.1)
Sodium: 136 mEq/L (ref 135–145)

## 2019-07-25 LAB — VITAMIN D 25 HYDROXY (VIT D DEFICIENCY, FRACTURES): VITD: 77.69 ng/mL (ref 30.00–100.00)

## 2019-08-01 ENCOUNTER — Ambulatory Visit: Payer: Medicare HMO | Admitting: Adult Health

## 2019-08-18 ENCOUNTER — Telehealth: Payer: Medicare HMO

## 2019-08-22 ENCOUNTER — Telehealth (INDEPENDENT_AMBULATORY_CARE_PROVIDER_SITE_OTHER): Payer: Medicare HMO | Admitting: Family Medicine

## 2019-08-22 ENCOUNTER — Emergency Department: Payer: Medicare HMO

## 2019-08-22 ENCOUNTER — Emergency Department
Admission: EM | Admit: 2019-08-22 | Discharge: 2019-08-22 | Disposition: A | Discharge status: Home / Self Care | Payer: Medicare HMO | Attending: Emergency Medicine | Admitting: Emergency Medicine

## 2019-08-22 ENCOUNTER — Encounter: Payer: Self-pay | Admitting: Emergency Medicine

## 2019-08-22 ENCOUNTER — Encounter: Payer: Self-pay | Admitting: Family Medicine

## 2019-08-22 ENCOUNTER — Other Ambulatory Visit: Payer: Self-pay

## 2019-08-22 DIAGNOSIS — F17200 Nicotine dependence, unspecified, uncomplicated: Secondary | ICD-10-CM

## 2019-08-22 DIAGNOSIS — E119 Type 2 diabetes mellitus without complications: Secondary | ICD-10-CM | POA: Diagnosis not present

## 2019-08-22 DIAGNOSIS — J449 Chronic obstructive pulmonary disease, unspecified: Secondary | ICD-10-CM

## 2019-08-22 DIAGNOSIS — J90 Pleural effusion, not elsewhere classified: Secondary | ICD-10-CM | POA: Diagnosis not present

## 2019-08-22 DIAGNOSIS — J40 Bronchitis, not specified as acute or chronic: Secondary | ICD-10-CM

## 2019-08-22 DIAGNOSIS — R69 Illness, unspecified: Secondary | ICD-10-CM | POA: Diagnosis not present

## 2019-08-22 DIAGNOSIS — R05 Cough: Secondary | ICD-10-CM

## 2019-08-22 DIAGNOSIS — R519 Headache, unspecified: Secondary | ICD-10-CM

## 2019-08-22 DIAGNOSIS — G35 Multiple sclerosis: Secondary | ICD-10-CM | POA: Diagnosis not present

## 2019-08-22 DIAGNOSIS — Z79899 Other long term (current) drug therapy: Secondary | ICD-10-CM | POA: Insufficient documentation

## 2019-08-22 DIAGNOSIS — R220 Localized swelling, mass and lump, head: Secondary | ICD-10-CM | POA: Diagnosis not present

## 2019-08-22 DIAGNOSIS — G8929 Other chronic pain: Secondary | ICD-10-CM | POA: Diagnosis not present

## 2019-08-22 DIAGNOSIS — R0602 Shortness of breath: Secondary | ICD-10-CM

## 2019-08-22 DIAGNOSIS — J189 Pneumonia, unspecified organism: Secondary | ICD-10-CM

## 2019-08-22 DIAGNOSIS — F17210 Nicotine dependence, cigarettes, uncomplicated: Secondary | ICD-10-CM | POA: Insufficient documentation

## 2019-08-22 DIAGNOSIS — R058 Other specified cough: Secondary | ICD-10-CM | POA: Insufficient documentation

## 2019-08-22 LAB — CBC WITH DIFFERENTIAL/PLATELET
Abs Immature Granulocytes: 0.01 10*3/uL (ref 0.00–0.07)
Basophils Absolute: 0.1 10*3/uL (ref 0.0–0.1)
Basophils Relative: 1 %
Eosinophils Absolute: 0.2 10*3/uL (ref 0.0–0.5)
Eosinophils Relative: 2 %
HCT: 44.5 % (ref 36.0–46.0)
Hemoglobin: 14.9 g/dL (ref 12.0–15.0)
Immature Granulocytes: 0 %
Lymphocytes Relative: 36 %
Lymphs Abs: 2.9 10*3/uL (ref 0.7–4.0)
MCH: 31.9 pg (ref 26.0–34.0)
MCHC: 33.5 g/dL (ref 30.0–36.0)
MCV: 95.3 fL (ref 80.0–100.0)
Monocytes Absolute: 0.4 10*3/uL (ref 0.1–1.0)
Monocytes Relative: 5 %
Neutro Abs: 4.4 10*3/uL (ref 1.7–7.7)
Neutrophils Relative %: 56 %
Platelets: 213 10*3/uL (ref 150–400)
RBC: 4.67 MIL/uL (ref 3.87–5.11)
RDW: 13.3 % (ref 11.5–15.5)
WBC: 8 10*3/uL (ref 4.0–10.5)
nRBC: 0 % (ref 0.0–0.2)

## 2019-08-22 LAB — BASIC METABOLIC PANEL
Anion gap: 9 (ref 5–15)
BUN: 12 mg/dL (ref 8–23)
CO2: 28 mmol/L (ref 22–32)
Calcium: 9.5 mg/dL (ref 8.9–10.3)
Chloride: 101 mmol/L (ref 98–111)
Creatinine, Ser: 0.95 mg/dL (ref 0.44–1.00)
GFR calc Af Amer: >60 mL/min (ref >60)
GFR calc non Af Amer: >60 mL/min (ref >60)
Glucose, Bld: 78 mg/dL (ref 70–99)
Potassium: 4.6 mmol/L (ref 3.5–5.1)
Sodium: 138 mmol/L (ref 135–145)

## 2019-08-22 MED ORDER — AZITHROMYCIN 250 MG PO TABS: 250.0000 mg | ORAL_TABLET | Freq: Every day | ORAL | 0 refills | Status: AC

## 2019-08-22 MED ORDER — PREDNISONE 20 MG PO TABS
60.0000 mg | ORAL_TABLET | Freq: Once | ORAL | Status: AC
  Administered 2019-08-22: 60 mg via ORAL
  Filled 2019-08-22: qty 3

## 2019-08-22 MED ORDER — AZITHROMYCIN 500 MG PO TABS
500.0000 mg | ORAL_TABLET | Freq: Once | ORAL | Status: AC
  Administered 2019-08-22: 500 mg via ORAL
  Filled 2019-08-22: qty 1

## 2019-08-22 MED ORDER — PREDNISONE 20 MG PO TABS: 40.0000 mg | ORAL_TABLET | Freq: Every day | ORAL | 0 refills | Status: AC

## 2019-08-22 NOTE — Progress Notes (Signed)
Virtual Visit via Video Note  I connected with Karina [name] on 08/22/19 at 12:00 PM EDT by a video enabled telemedicine application and verified that I am speaking with the correct person using two identifiers.  Location: Patient: home Provider: office   I discussed the limitations of evaluation and management by telemedicine and the availability of in person appointments. The patient expressed understanding and agreed to proceed.  Parties involved in encounter  Patient: Karina [name]  Provider:  Roxy Manns MD    History of Present Illness: Pt presents with cough and congestion  Worse GERD Knot on back of head  Current smoker - got down to 10 cig per day  symbicort makes her cough -does not use regularly  Uses her inhaler more than that-albuterol   Has felt poorly for 3 weeks  Swelling in the back of her head for a week- recurrent  Cough- purulent sputum  Wheezing quite a bit  Sob as well   Congestion - mild  Nose is generally stuffy No ST No facial pain   No fever  No loss of taste or smell  No appetite   Has isolated self for 8 days  No sick contacts   She has not had covid shots -afraid of them  Family has been immunized   Otc: inhaler  mucinex and saline   Feels like right now she needs to go to there ER - head pain on L side/posterior is severe and she is sob  Feels like she can drive herself  [facility] is close  Patient Active Problem List   Diagnosis Date Noted  . Productive cough 08/22/2019  . Head pain 08/22/2019  . Elevated serum creatinine 04/24/2019  . Frequent urination 04/20/2019  . Localized swelling, mass and lump, neck 04/20/2019  . Decreased appetite 04/20/2019  . Screening mammogram, encounter for 04/20/2019  . Weight loss 02/12/2019  . Rib pain 05/05/2018  . Seborrheic keratosis, inflamed 12/15/2017  . B12 deficiency 08/24/2017  . Left low back pain 08/24/2017  . Erosive osteoarthritis of both hands 09/09/2016  . Fatigue 09/07/2016   . Bilateral hand pain 09/07/2016  . Routine general medical examination at a health care facility 05/06/2015  . Large breasts 05/06/2015  . Lumbar disc disease 11/01/2014  . Knee pain, bilateral 03/11/2014  . Thoracic back pain 11/30/2013  . Encounter for routine gynecological examination 01/31/2013  . Encounter for Medicare annual wellness exam 01/23/2013  . Chest pain 09/20/2012  . SOB (shortness of breath) 09/20/2012  . Dyspnea 09/13/2012  . Asthmatic bronchitis , chronic (HCC) 12/06/2011  . Chronic cough 11/22/2011  . Anxiety disorder 07/17/2009  . HEMORRHOIDS-EXTERNAL 11/07/2008  . IRRITABLE BOWEL SYNDROME 11/07/2008  . Lung nodule 10/14/2008  . Prediabetes 10/14/2008  . PERSONAL HX COLONIC POLYPS 10/08/2008  . Vitamin D deficiency 07/23/2008  . SYNCOPE, HX OF 06/01/2008  . FOOT SURGERY, HX OF 06/01/2008  . DILATION AND CURETTAGE, HX OF 06/01/2008  . OSTEOARTHRITIS, SHOULDER, RIGHT 03/22/2008  . ROTATOR CUFF SYNDROME, RIGHT 03/22/2008  . ARTHRALGIA 12/12/2007  . Allergic rhinitis 08/01/2007  . Smoker 10/17/2006  . Peripheral neuropathy 10/17/2006  . FOOT PAIN, BILATERAL 10/17/2006  . HYPOGLYCEMIA 09/30/2006  . HYPERCHOLESTEROLEMIA 09/30/2006  . MULTIPLE SCLEROSIS 09/30/2006  . GERD 09/30/2006  . FIBROCYSTIC BREAST DISEASE 09/30/2006   Past Medical History:  Diagnosis Date  . Allergic rhinitis   . Allergy   . Anxiety    Councelor- Evalina Field, no per pt  . Cataract   .  Colon polyps 08/2008   Adenomatous  . COPD (chronic obstructive pulmonary disease) ([place])   . Depression    no per pt  . Diabetes mellitus   . Gastritis 08/2008   H. pylori  . GERD (gastroesophageal reflux disease)   . History of shingles    Left V1  . Hypercholesteremia   . Lung nodule   . Menopausal disorder   . Multiple sclerosis ([place])   . Neuromuscular disorder ([place])    Multiple sclerosis  . Plantar fasciitis   . Seizure disorder ([place])    single seizure/hypoglycemia  . Seizures  ([place])    1 seizure in 2001 due to blood sugar dropping to 38, none since  . Skin lesion 04/05/2017  . Urinary incontinence   . Vertigo    Past Surgical History:  Procedure Laterality Date  . CERVICAL DISCECTOMY  2004   x 2, Anterior; Fusion C4-5, C5-6, C6-7, Dr. Kary Kos  . CHEST CT  11/2008   With small 4 mm nodule L lung base (rec re check in 1 year)  . CHEST CT  07/2009   Re check chest CT - lung nodule stable  . CHOLECYSTECTOMY    . COLONOSCOPY  08/2008   Polyps/ re check 5 yrs  . CT SINUS LTD W/O CM  02/2009   negative  . DILATION AND CURETTAGE OF UTERUS    . ESOPHAGOGASTRODUODENOSCOPY  08/2008   Erosive gastritis, h pylori (treated)  . FOOT SURGERY     left plantar fascial problem  . ROTATOR CUFF REPAIR     right  . TONSILLECTOMY    . TUBAL LIGATION    . UPPER GASTROINTESTINAL ENDOSCOPY     Social History   Tobacco Use  . Smoking status: Current Every Day Smoker    Packs/day: 0.50    Years: 30.00    Pack years: 15.00    Types: Cigarettes  . Smokeless tobacco: Never Used  Substance Use Topics  . Alcohol use: No    Alcohol/week: 0.0 standard drinks  . Drug use: No   Family History  Problem Relation Age of Onset  . Migraines Mother   . Heart attack Mother   . Coronary artery disease Mother   . Coronary artery disease Father        6 bypasses   . Stroke Father   . Diabetes Father   . Renal Disease Father   . Colon cancer Maternal Grandfather 63  . Coronary artery disease Cousin   . Coronary artery disease Paternal Grandmother   . Lung cancer Paternal Aunt   . Multiple sclerosis Neg Hx   . Esophageal cancer Neg Hx   . Rectal cancer Neg Hx   . Stomach cancer Neg Hx   . Breast cancer Neg Hx    Allergies  Allergen Reactions  . Percocet [Oxycodone-Acetaminophen] Shortness Of Breath    Felt like going to pass out and sweating  . Tecfidera [Dimethyl Fumarate] Shortness Of Breath, Palpitations, Rash and Cough  . Amitriptyline Hcl     REACTION: swelling  and itching  . Atorvastatin     REACTION: elevated LFT's  . Betaseron [Interferon Beta-1b]     Headache  . Clarithromycin     REACTION: reaction not known  . Duloxetine     REACTION: pain  . Levofloxacin     REACTION: Rash  . Pregabalin     REACTION: LE swelling  . Pseudoephedrine     REACTION: legs hurt  . Zoloft [Sertraline Hcl]  Other (See Comments)    Headaches    Current Outpatient Medications on File Prior to Visit  Medication Sig Dispense Refill  . albuterol (VENTOLIN HFA) 108 (90 Base) MCG/ACT inhaler INHALE 2 PUFFS UP TO EVERY 4 HOURS AS NEEDED FOR WHEEZING 3 Inhaler 3  . ALPRAZolam (XANAX) 0.5 MG tablet Take 1 tablet (0.5 mg total) by mouth 2 (two) times daily as needed. For anxiety. 30 tablet 0  . azelastine (ASTELIN) 0.1 % nasal spray Place 2 sprays into both nostrils 2 (two) times daily as needed.  12  . Blood Glucose Monitoring Suppl (ACCU-CHEK AVIVA PLUS) w/Device KIT Check blood sugar once daily and as directed Dx R73.09 1 kit 0  . budesonide-formoterol (SYMBICORT) 160-4.5 MCG/ACT inhaler Inhale 2 puffs into the lungs 2 (two) times daily. (Patient taking differently: Inhale 2 puffs into the lungs daily as needed. ) 3 Inhaler 2  . Diclofenac Sodium 3 % GEL Place 1 application onto the skin every 12 (twelve) hours as needed. 50 g 0  . gabapentin (NEURONTIN) 300 MG capsule TAKE 1 CAPSULE BY MOUTH TWICE DURING THE DAY AND 2 CAPSULES BY MOUTH AT NIGHT 360 capsule 4  . ipratropium-albuterol (DUONEB) 0.5-2.5 (3) MG/3ML SOLN TAKE 3 MLS BY NEBULIZATION EVERY 6 (SIX) HOURS AS NEEDED. 360 mL 0   No current facility-administered medications on file prior to visit.   Review of Systems  Constitutional: Positive for malaise/fatigue. Negative for chills and fever.  HENT: Positive for congestion. Negative for ear discharge, ear pain, sinus pain and sore throat.   Eyes: Negative for blurred vision, discharge and redness.  Respiratory: Positive for cough, sputum production, shortness  of breath and wheezing. Negative for stridor.   Cardiovascular: Negative for chest pain, palpitations and leg swelling.  Gastrointestinal: Negative for abdominal pain, diarrhea, nausea and vomiting.  Musculoskeletal: Positive for back pain and neck pain. Negative for myalgias.  Skin: Negative for rash.  Neurological: Positive for headaches. Negative for dizziness.       Pain in the back of head L side with ? Lump  Baseline has MS    Observations/Objective: Patient appears fatigued  Weight is baseline  No facial swelling or asymmetry Edentulous-does not have a denture in  Normal voice-not hoarse and no slurred speech No obvious tremor or mobility impairment Moving neck and UEs normally Able to hear the call well  Prod sounding cough , no audible wheeze but seems mildly sob with speech Speech is at times tangential due to multiple symptoms  from different organ symptoms (a bit hard to follow history wise)  No skin changes on face or neck , no rash or pallor Affect seems anxious    Assessment and Plan: Problem List Items Addressed This Visit      Respiratory   Asthmatic bronchitis , chronic (HCC)    More prod cough and wheezing lately  Not using symbicort since it makes cough worse per pt  She may have an infection  Needs eval/ poss cxr and covid testing  She will head to ER now at [facility] to be seen  Strongly enc pt to quit smoking         Other   Smoker    Has cut back to 10 cig per day Not ready to quit  Aware of the risks  Now having cough/wheeze and sob (acute on chronic)       SOB (shortness of breath)   Productive cough - Primary    3 weeks getting worse in  smoker with sob and wheeze  Decided she wants eval in ER-heading over to [facility] now  She needs covid testing (not immunized)   ? If possible pneumonia        Head pain    Acute on chronic pain in L posterior head- per pt getting worse over the past several months  Has used heating pad and burned  herself Feels like it needs attention now and will go to [facility]          Follow Up Instructions: Since symptoms are getting worse- proceed to ER at [facility] to be seen in person and treated  Call 911 if you feel unable to drive   Please think about quitting smoking- many of your respiratory problems come from this and are worsened by it  You need to be tested for covid with current symptoms   I discussed the assessment and treatment plan with the patient. The patient was provided an opportunity to ask questions and all were answered. The patient agreed with the plan and demonstrated an understanding of the instructions.   The patient was advised to call back or seek an in-person evaluation if the symptoms worsen or if the condition fails to improve as anticipated.  Loura Pardon, MD

## 2019-08-22 NOTE — ED Notes (Signed)
Pt has knot on back of head and reports having flu like symptoms. See triage note.

## 2019-08-22 NOTE — Assessment & Plan Note (Signed)
Has cut back to 10 cig per day Not ready to quit  Aware of the risks  Now having cough/wheeze and sob (acute on chronic)

## 2019-08-22 NOTE — ED Triage Notes (Signed)
Patient reports she has had a knot on the back of her head for the last 3 weeks. States she has had this in the past but it usually goes away after a week. Patient reports pain to head. Patient also states "I think I've got bronchitis". Patient reports head congestion and wheezing.

## 2019-08-22 NOTE — Patient Instructions (Addendum)
Since symptoms are getting worse- proceed to ER at [facility] to be seen in person and treated  Call 911 if you feel unable to drive   Please think about quitting smoking- many of your respiratory problems come from this and are worsened by it  You need to be tested for covid with current symptoms

## 2019-08-22 NOTE — Assessment & Plan Note (Signed)
3 weeks getting worse in smoker with sob and wheeze  Decided she wants eval in ER-heading over to [facility] now  She needs covid testing (not immunized)   ? If possible pneumonia

## 2019-08-22 NOTE — ED Provider Notes (Signed)
[facility] Emergency Department Provider Note ____________________________________________  Time seen: 1501  I have reviewed the triage vital signs and the nursing notes.  HISTORY  Chief Complaint  "Knot on Head" and Cough  HPI Karina [name] is a 64 y.o. female presents her self to the ED for evaluation of  cough, congestion, and wheezing.  Patient who is a current everyday smoker, with history of COPD, reports concerns of a possible bronchitis.  She has been using albuterol nebulizer and inhaler regularly.  She does not take any daily inhaled steroids.  She also has an unrelated concern for a knot to the back of her head.  She describes an tender, nodular lesion under the skin, to the back of the scalp that has been present for a few weeks.  She describes the area seems to wax and wane, and typically is present when she has an upper respiratory infection.  She was apparently evaluated by her primary provider 2 months earlier for a inflamed salivary gland or salivary gland cyst, but has not followed up with ENT for procedural drainage.  Past Medical History:  Diagnosis Date  . Allergic rhinitis   . Allergy   . Anxiety    Councelor- Pervis Hocking, no per pt  . Cataract   . Colon polyps 08/2008   Adenomatous  . COPD (chronic obstructive pulmonary disease) ([place])   . Depression    no per pt  . Diabetes mellitus   . Gastritis 08/2008   H. pylori  . GERD (gastroesophageal reflux disease)   . History of shingles    Left V1  . Hypercholesteremia   . Lung nodule   . Menopausal disorder   . Multiple sclerosis ([place])   . Neuromuscular disorder ([place])    Multiple sclerosis  . Plantar fasciitis   . Seizure disorder ([place])    single seizure/hypoglycemia  . Seizures ([place])    1 seizure in 2001 due to blood sugar dropping to 38, none since  . Skin lesion 04/05/2017  . Urinary incontinence   . Vertigo     Patient Active Problem List   Diagnosis Date Noted  .  Productive cough 08/22/2019  . Head pain 08/22/2019  . Elevated serum creatinine 04/24/2019  . Frequent urination 04/20/2019  . Localized swelling, mass and lump, neck 04/20/2019  . Decreased appetite 04/20/2019  . Screening mammogram, encounter for 04/20/2019  . Weight loss 02/12/2019  . Rib pain 05/05/2018  . Seborrheic keratosis, inflamed 12/15/2017  . B12 deficiency 08/24/2017  . Left low back pain 08/24/2017  . Erosive osteoarthritis of both hands 09/09/2016  . Fatigue 09/07/2016  . Bilateral hand pain 09/07/2016  . Routine general medical examination at a health care facility 05/06/2015  . Large breasts 05/06/2015  . Lumbar disc disease 11/01/2014  . Knee pain, bilateral 03/11/2014  . Thoracic back pain 11/30/2013  . Encounter for routine gynecological examination 01/31/2013  . Encounter for Medicare annual wellness exam 01/23/2013  . Chest pain 09/20/2012  . SOB (shortness of breath) 09/20/2012  . Dyspnea 09/13/2012  . Asthmatic bronchitis , chronic ([place]) 12/06/2011  . Chronic cough 11/22/2011  . Anxiety disorder 07/17/2009  . HEMORRHOIDS-EXTERNAL 11/07/2008  . IRRITABLE BOWEL SYNDROME 11/07/2008  . Lung nodule 10/14/2008  . Prediabetes 10/14/2008  . PERSONAL HX COLONIC POLYPS 10/08/2008  . Vitamin D deficiency 07/23/2008  . SYNCOPE, HX OF 06/01/2008  . FOOT SURGERY, HX OF 06/01/2008  . DILATION AND CURETTAGE, HX OF 06/01/2008  . OSTEOARTHRITIS, SHOULDER,  RIGHT 03/22/2008  . ROTATOR CUFF SYNDROME, RIGHT 03/22/2008  . ARTHRALGIA 12/12/2007  . Allergic rhinitis 08/01/2007  . Smoker 10/17/2006  . Peripheral neuropathy 10/17/2006  . FOOT PAIN, BILATERAL 10/17/2006  . HYPOGLYCEMIA 09/30/2006  . HYPERCHOLESTEROLEMIA 09/30/2006  . MULTIPLE SCLEROSIS 09/30/2006  . GERD 09/30/2006  . FIBROCYSTIC BREAST DISEASE 09/30/2006    Past Surgical History:  Procedure Laterality Date  . CERVICAL DISCECTOMY  2004   x 2, Anterior; Fusion C4-5, C5-6, C6-7, Dr. Kary Kos  .  CHEST CT  11/2008   With small 4 mm nodule L lung base (rec re check in 1 year)  . CHEST CT  07/2009   Re check chest CT - lung nodule stable  . CHOLECYSTECTOMY    . COLONOSCOPY  08/2008   Polyps/ re check 5 yrs  . CT SINUS LTD W/O CM  02/2009   negative  . DILATION AND CURETTAGE OF UTERUS    . ESOPHAGOGASTRODUODENOSCOPY  08/2008   Erosive gastritis, h pylori (treated)  . FOOT SURGERY     left plantar fascial problem  . ROTATOR CUFF REPAIR     right  . TONSILLECTOMY    . TUBAL LIGATION    . UPPER GASTROINTESTINAL ENDOSCOPY      Prior to Admission medications   Medication Sig Start Date End Date Taking? Authorizing Provider  albuterol (VENTOLIN HFA) 108 (90 Base) MCG/ACT inhaler INHALE 2 PUFFS UP TO EVERY 4 HOURS AS NEEDED FOR WHEEZING 09/10/16   [name], Wynelle Fanny, MD  ALPRAZolam Duanne Moron) 0.5 MG tablet Take 1 tablet (0.5 mg total) by mouth 2 (two) times daily as needed. For anxiety. 02/11/15   [name], Wynelle Fanny, MD  azelastine (ASTELIN) 0.1 % nasal spray Place 2 sprays into both nostrils 2 (two) times daily as needed. 09/20/14   [provider]  azithromycin (ZITHROMAX Z-PAK) 250 MG tablet Take 1 tablet (250 mg total) by mouth daily for 4 days. 08/23/19 08/27/19  Agnieszka Newhouse, Dannielle Karvonen, PA-C  Blood Glucose Monitoring Suppl (ACCU-CHEK AVIVA PLUS) w/Device KIT Check blood sugar once daily and as directed Dx R73.09 08/19/15   [name], Wynelle Fanny, MD  budesonide-formoterol [facility]) 160-4.5 MCG/ACT inhaler Inhale 2 puffs into the lungs 2 (two) times daily. Patient taking differently: Inhale 2 puffs into the lungs daily as needed.  08/19/15   [name], Wynelle Fanny, MD  Diclofenac Sodium 3 % GEL Place 1 application onto the skin every 12 (twelve) hours as needed. 06/18/16   Schaevitz, Randall An, MD  gabapentin (NEURONTIN) 300 MG capsule TAKE 1 CAPSULE BY MOUTH TWICE DURING THE DAY AND 2 CAPSULES BY MOUTH AT NIGHT 07/03/18   Kathrynn Ducking, MD  ipratropium-albuterol (DUONEB) 0.5-2.5 (3) MG/3ML SOLN  TAKE 3 MLS BY NEBULIZATION EVERY 6 (SIX) HOURS AS NEEDED. 09/05/18   Laverle Hobby, MD  predniSONE (DELTASONE) 20 MG tablet Take 2 tablets (40 mg total) by mouth daily with breakfast for 5 days. 08/22/19 08/27/19  Kaimen Peine, Dannielle Karvonen, PA-C    Allergies Percocet [oxycodone-acetaminophen], Tecfidera [dimethyl fumarate], Amitriptyline hcl, Atorvastatin, Betaseron [interferon beta-1b], Clarithromycin, Duloxetine, Levofloxacin, Pregabalin, Pseudoephedrine, and Zoloft [sertraline hcl]  Family History  Problem Relation Age of Onset  . Migraines Mother   . Heart attack Mother   . Coronary artery disease Mother   . Coronary artery disease Father        6 bypasses   . Stroke Father   . Diabetes Father   . Renal Disease Father   . Colon cancer Maternal Grandfather 85  .  Coronary artery disease Cousin   . Coronary artery disease Paternal Grandmother   . Lung cancer Paternal Aunt   . Multiple sclerosis Neg Hx   . Esophageal cancer Neg Hx   . Rectal cancer Neg Hx   . Stomach cancer Neg Hx   . Breast cancer Neg Hx     Social History Social History   Tobacco Use  . Smoking status: Current Every Day Smoker    Packs/day: 0.50    Years: 30.00    Pack years: 15.00    Types: Cigarettes  . Smokeless tobacco: Never Used  Substance Use Topics  . Alcohol use: No    Alcohol/week: 0.0 standard drinks  . Drug use: No    Review of Systems  Constitutional: Negative for fever.  Reports generalized malaise and fatigue. Eyes: Negative for visual changes. ENT: Negative for sore throat. Cardiovascular: Negative for chest pain. Respiratory: Positive for shortness of breath and wheeze. Gastrointestinal: Negative for abdominal pain, vomiting and diarrhea.  Reports decreased appetite. Genitourinary: Negative for dysuria. Musculoskeletal: Negative for back pain. Skin: Negative for rash. Neurological: Negative for headaches, focal weakness or  numbness. ____________________________________________  PHYSICAL EXAM:  VITAL SIGNS: ED Triage Vitals  Enc Vitals Group     BP 08/22/19 1340 122/82     Pulse Rate 08/22/19 1349 88     Resp 08/22/19 1340 18     Temp 08/22/19 1340 98.7 F (37.1 C)     Temp Source 08/22/19 1340 Oral     SpO2 08/22/19 1340 94 %     Weight 08/22/19 1341 150 lb (68 kg)     Height 08/22/19 1341 '5\' 1"'$  (1.549 m)     Head Circumference --      Peak Flow --      Pain Score 08/22/19 1350 8     Pain Loc --      Pain Edu? --      Excl. in [place]? --     Constitutional: Alert and oriented. Well appearing and in no distress. Head: Normocephalic and atraumatic. Eyes: Conjunctivae are normal. Normal extraocular movements Mouth/Throat: Mucous membranes are moist.  No oral lesions  Neck: Supple. No thyromegaly. Hematological/Lymphatic/Immunological: No cervical lymphadenopathy. Palpable occipital lymph node to the left posterior scalp. Cardiovascular: Normal rate, regular rhythm. Normal distal pulses. Respiratory: Normal respiratory effort. Mild expiratory wheeze bilaterally. No rales/rhonchi. Gastrointestinal: Soft and nontender. No distention. Musculoskeletal: Nontender with normal range of motion in all extremities.  Neurologic:  Normal gait without ataxia. Normal speech and language. No gross focal neurologic deficits are appreciated. Skin:  Skin is warm, dry and intact. No rash noted. Psychiatric: Mood and affect are normal. Patient exhibits appropriate insight and judgment. ____________________________________________   LABORATORY   Labs Reviewed  BASIC METABOLIC PANEL  CBC WITH DIFFERENTIAL/PLATELET  ____________________________________________   RADIOLOGY  CXR  IMPRESSION: Mild coarsening of the interstitium diffusely, favor chronic bronchitic change, acute component difficult to exclude. ____________________________________________  PROCEDURES  Azithromycin 500 mg PO Prednisone 60 mg  PO  Procedures ____________________________________________  INITIAL IMPRESSION / ASSESSMENT AND PLAN / ED COURSE  Patient with ED evaluation of cough, congestion, and wheezing.  Patient denies any interim fevers, but describes overall malaise, fatigue, and loss of appetite.  Patient's labs overall benign reassuring at this time.  X-ray does reveal some coarse bronchitic changes versus an acute component.  As such, we will treat the patient empirically with azithromycin for community pneumonia.  She will also be discharged with prescription for prednisone to take  as directed.  Patient is encouraged to follow-up with her primary provider or return to the ED as needed.  Karina [name] was evaluated in Emergency Department on 08/23/2019 for the symptoms described in the history of present illness. She was evaluated in the context of the global COVID-19 pandemic, which necessitated consideration that the patient might be at risk for infection with the SARS-CoV-2 virus that causes COVID-19. Institutional protocols and algorithms that pertain to the evaluation of patients at risk for COVID-19 are in a state of rapid change based on information released by regulatory bodies including the CDC and federal and state organizations. These policies and algorithms were followed during the patient's care in the ED. ____________________________________________  FINAL CLINICAL IMPRESSION(S) / ED DIAGNOSES  Final diagnoses:  Bronchitis  Community acquired pneumonia, unspecified laterality      Melvenia Needles, PA-C 08/23/19 1816    Delman Kitten, MD 08/24/19 1552

## 2019-08-22 NOTE — Assessment & Plan Note (Signed)
Acute on chronic pain in L posterior head- per pt getting worse over the past several months  Has used heating pad and burned herself Feels like it needs attention now and will go to [facility]

## 2019-08-22 NOTE — Assessment & Plan Note (Signed)
More prod cough and wheezing lately  Not using symbicort since it makes cough worse per pt  She may have an infection  Needs eval/ poss cxr and covid testing  She will head to ER now at [facility] to be seen  Strongly enc pt to quit smoking

## 2019-08-22 NOTE — Discharge Instructions (Signed)
Your exam and labs are normal at this time. Your CXR shows some chronic changes related to bronchitis, but a possible pneumonia is there. Take the antibiotic and steroid as directed. Take your home inhalers as prescribed. Follow-up with Dr. Glori Bickers about the lymph node. Increase your calorie intake.  Return to the ED as needed.

## 2019-08-30 ENCOUNTER — Other Ambulatory Visit: Payer: Self-pay

## 2019-08-30 ENCOUNTER — Encounter: Payer: Self-pay | Admitting: Internal Medicine

## 2019-08-30 ENCOUNTER — Ambulatory Visit (INDEPENDENT_AMBULATORY_CARE_PROVIDER_SITE_OTHER): Payer: Medicare HMO | Admitting: Internal Medicine

## 2019-08-30 VITALS — BP 110/78 | HR 83 | Temp 97.9°F | Ht 61.0 in | Wt 154.8 lb

## 2019-08-30 DIAGNOSIS — G8929 Other chronic pain: Secondary | ICD-10-CM

## 2019-08-30 DIAGNOSIS — J40 Bronchitis, not specified as acute or chronic: Secondary | ICD-10-CM | POA: Diagnosis not present

## 2019-08-30 DIAGNOSIS — R220 Localized swelling, mass and lump, head: Secondary | ICD-10-CM

## 2019-08-30 DIAGNOSIS — M542 Cervicalgia: Secondary | ICD-10-CM | POA: Diagnosis not present

## 2019-08-30 MED ORDER — KETOROLAC TROMETHAMINE 30 MG/ML IJ SOLN
30.0000 mg | Freq: Once | INTRAMUSCULAR | Status: AC
  Administered 2019-08-30: 30 mg via INTRAMUSCULAR

## 2019-08-30 NOTE — Patient Instructions (Signed)
Edema  Edema is when you have too much fluid in your body or under your skin. Edema may make your legs, feet, and ankles swell up. Swelling is also common in looser tissues, like around your eyes. This is a common condition. It gets more common as you get older. There are many possible causes of edema. Eating too much salt (sodium) and being on your feet or sitting for a long time can cause edema in your legs, feet, and ankles. Hot weather may make edema worse. Edema is usually painless. Your skin may look swollen or shiny. Follow these instructions at home:  Keep the swollen body part raised (elevated) above the level of your heart when you are sitting or lying down.  Do not sit still or stand for a long time.  Do not wear tight clothes. Do not wear garters on your upper legs.  Exercise your legs. This can help the swelling go down.  Wear elastic bandages or support stockings as told by your doctor.  Eat a low-salt (low-sodium) diet to reduce fluid as told by your doctor.  Depending on the cause of your swelling, you may need to limit how much fluid you drink (fluid restriction).  Take over-the-counter and prescription medicines only as told by your doctor. Contact a doctor if:  Treatment is not working.  You have heart, liver, or kidney disease and have symptoms of edema.  You have sudden and unexplained weight gain. Get help right away if:  You have shortness of breath or chest pain.  You cannot breathe when you lie down.  You have pain, redness, or warmth in the swollen areas.  You have heart, liver, or kidney disease and get edema all of a sudden.  You have a fever and your symptoms get worse all of a sudden. Summary  Edema is when you have too much fluid in your body or under your skin.  Edema may make your legs, feet, and ankles swell up. Swelling is also common in looser tissues, like around your eyes.  Raise (elevate) the swollen body part above the level of your  heart when you are sitting or lying down.  Follow your doctor's instructions about diet and how much fluid you can drink (fluid restriction). This information is not intended to replace advice given to you by your health care provider. Make sure you discuss any questions you have with your health care provider. Document Revised: 02/25/2017 Document Reviewed: 03/12/2016 Elsevier Patient Education  2020 Elsevier Inc.  

## 2019-08-30 NOTE — Progress Notes (Signed)
Subjective:    Patient ID: Karina [name], female    DOB: 11/20/1955, 64 y.o.   MRN: [id number]  HPI   Pt presents to the clinic today with c/o swelling to her left posterior scalp.  She reports this has been intermittent over the last several months.  She reports the area is tender to touch.  She reports the swelling is causing some ringing in her left ear and some neck stiffness but she denies headaches or, dizziness or visual changes.  She denies fever, chills or body aches.  CT soft tissue head and neck from 05/2019 showed:  IMPRESSION: Mild asymmetric enlargement of the right submandibular gland withoutobstructing stone or tumor. Question episodes of sialoadenitis. No mass or adenopathy in neck. 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  She was recently seen in the ER for bronchitis pneumonia. She was treated with Prednisone and Azithromycin. She reports her cough and SOB are improving but has not had any improvement in the swelling of her scalp.  Review of Systems      Past Medical History:  Diagnosis Date  . Allergic rhinitis   . Allergy   . Anxiety    [place] [place], no per pt  . Cataract   . Colon polyps 08/2008   Adenomatous  . COPD (chronic obstructive pulmonary disease) ([place])   . Depression    no per pt  . Diabetes mellitus   . Gastritis 08/2008   H. pylori  . GERD (gastroesophageal reflux disease)   . History of shingles    Left V1  . Hypercholesteremia   . Lung nodule   . Menopausal disorder   . Multiple sclerosis ([place])   . Neuromuscular disorder ([place])    Multiple sclerosis  . Plantar fasciitis   . Seizure disorder ([place])    single seizure/hypoglycemia  . Seizures ([place])    1 seizure in 2001 due to blood sugar dropping to 38, none since  . Skin lesion 04/05/2017  . Urinary incontinence   . Vertigo     Current Outpatient Medications  Medication Sig Dispense Refill  . albuterol (VENTOLIN HFA) 108 (90 Base) MCG/ACT inhaler INHALE 2  PUFFS UP TO EVERY 4 HOURS AS NEEDED FOR WHEEZING 3 Inhaler 3  . ALPRAZolam (XANAX) 0.5 MG tablet Take 1 tablet (0.5 mg total) by mouth 2 (two) times daily as needed. For anxiety. 30 tablet 0  . azelastine (ASTELIN) 0.1 % nasal spray Place 2 sprays into both nostrils 2 (two) times daily as needed.  12  . Blood Glucose Monitoring Suppl (ACCU-CHEK AVIVA PLUS) w/Device KIT Check blood sugar once daily and as directed Dx R73.09 1 kit 0  . budesonide-formoterol (SYMBICORT) 160-4.5 MCG/ACT inhaler Inhale 2 puffs into the lungs 2 (two) times daily. (Patient taking differently: Inhale 2 puffs into the lungs daily as needed. ) 3 Inhaler 2  . Diclofenac Sodium 3 % GEL Place 1 application onto the skin every 12 (twelve) hours as needed. 50 g 0  . gabapentin (NEURONTIN) 300 MG capsule TAKE 1 CAPSULE BY MOUTH TWICE DURING THE DAY AND 2 CAPSULES BY MOUTH AT NIGHT 360 capsule 4  . ipratropium-albuterol (DUONEB) 0.5-2.5 (3) MG/3ML SOLN TAKE 3 MLS BY NEBULIZATION EVERY 6 (SIX) HOURS AS NEEDED. 360 mL 0   No current facility-administered medications for this visit.    Allergies  Allergen Reactions  . Percocet [Oxycodone-Acetaminophen] Shortness Of Breath    Felt like going to pass out and sweating  . Tecfidera [Dimethyl  Fumarate] Shortness Of Breath, Palpitations, Rash and Cough  . Amitriptyline Hcl     REACTION: swelling and itching  . Atorvastatin     REACTION: elevated LFT's  . Betaseron [Interferon Beta-1b]     Headache  . Clarithromycin     REACTION: reaction not known  . Duloxetine     REACTION: pain  . Levofloxacin     REACTION: Rash  . Pregabalin     REACTION: LE swelling  . Pseudoephedrine     REACTION: legs hurt  . Zoloft [Sertraline Hcl] Other (See Comments)    Headaches     Family History  Problem Relation Age of Onset  . Migraines Mother   . Heart attack Mother   . Coronary artery disease Mother   . Coronary artery disease Father        6 bypasses   . Stroke Father   .  Diabetes Father   . Renal Disease Father   . Colon cancer Maternal Grandfather 53  . Coronary artery disease Cousin   . Coronary artery disease Paternal Grandmother   . Lung cancer Paternal Aunt   . Multiple sclerosis Neg Hx   . Esophageal cancer Neg Hx   . Rectal cancer Neg Hx   . Stomach cancer Neg Hx   . Breast cancer Neg Hx     Social History   Socioeconomic History  . Marital status: Married    Spouse name: Not on file  . Number of children: 3  . Years of education: 9 th  . Highest education level: Not on file  Occupational History  . Occupation: Disabled     Employer: DISABLED  Tobacco Use  . Smoking status: Current Every Day Smoker    Packs/day: 0.50    Years: 30.00    Pack years: 15.00    Types: Cigarettes  . Smokeless tobacco: Never Used  Substance and Sexual Activity  . Alcohol use: No    Alcohol/week: 0.0 standard drinks  . Drug use: No  . Sexual activity: Not on file  Other Topics Concern  . Not on file  Social History Narrative   Married with 3 children   Daily caffeine use - 2    Disabled.   Education 9th grade    Caffeine one cup of coffee daily.   Patient is right handed.    Social Determinants of Health   Financial Resource Strain:   . Difficulty of Paying Living Expenses:   Food Insecurity:   . Worried About Charity fundraiser in the Last Year:   . Arboriculturist in the Last Year:   Transportation Needs:   . Film/video editor (Medical):   Marland Kitchen Lack of Transportation (Non-Medical):   Physical Activity:   . Days of Exercise per Week:   . Minutes of Exercise per Session:   Stress:   . Feeling of Stress :   Social Connections:   . Frequency of Communication with Friends and Family:   . Frequency of Social Gatherings with Friends and Family:   . Attends Religious Services:   . Active Member of Clubs or Organizations:   . Attends Archivist Meetings:   Marland Kitchen Marital Status:   Intimate Partner Violence:   . Fear of Current or  Ex-Partner:   . Emotionally Abused:   Marland Kitchen Physically Abused:   . Sexually Abused:      Constitutional: Denies fever, malaise, fatigue, headache or abrupt weight changes.  HEENT: Pt reports ringing in  her left ear. Denies eye pain, eye redness, ear pain, ringing in the ears, wax buildup, runny nose, nasal congestion, bloody nose, or sore throat. Respiratory: Denies difficulty breathing, shortness of breath, cough or sputum production.   Cardiovascular: Denies chest pain, chest tightness, palpitations or swelling in the hands or feet.  Musculoskeletal: Pt reports neck stiffness. Denies decrease in range of motion, difficulty with gait, or joint swelling.  Skin: Pt reports swelling of left posterior scalp. Denies redness, rashes, lesions or ulcercations.  Neurological: Denies dizziness, difficulty with memory, difficulty with speech or problems with balance and coordination.    No other specific complaints in a complete review of systems (except as listed in HPI above).  Objective:   Physical Exam   BP 110/78 (BP Location: Left Arm, Patient Position: Sitting, Cuff Size: Normal)   Pulse 83   Temp 97.9 F (36.6 C) (Temporal)   Ht _0  (1.549 m)   Wt 70.2 kg   SpO2 96%   BMI 29.25 kg/m   Wt Readings from Last 3 Encounters:  08/22/19 150 lb (68 kg)  04/20/19 150 lb 7 oz (68.2 kg)  02/12/19 154 lb 8 oz (70.1 kg)    General: Appears her stated age, well developed, well nourished in NAD. Skin: Warm, dry and intact. Small scab noted posterior midline scalp. HEENT: Head: normal shape and size; Eyes: sclera white, no icterus, conjunctiva pink, PERRLA and EOMs intact; Ears: Tm's gray and intact, normal light reflex; Throat/Mouth: Teeth present, mucosa pink and moist, no exudate, lesions or ulcerations noted.  Neck:  No adenopathy noted. Swelling noted of the left posterior scalp. Cardiovascular: Normal rate and rhythm. Pulmonary/Chest: Normal effort and positive vesicular breath sounds.  No respiratory distress. No wheezes, rales or ronchi noted.  Musculoskeletal: Normal flexion, extension and rotation of the spine. No bony tenderness noted over the spine. Shoulder shrugs equal. Neurological: Alert and oriented.   BMET    Component Value Date/Time   NA 138 08/22/2019 1602   NA 141 11/21/2014 1137   NA 136 07/09/2013 1803   K 4.6 08/22/2019 1602   K 4.2 07/09/2013 1803   CL 101 08/22/2019 1602   CL 105 07/09/2013 1803   CO2 28 08/22/2019 1602   CO2 22 07/09/2013 1803   GLUCOSE 78 08/22/2019 1602   GLUCOSE 96 07/09/2013 1803   BUN 12 08/22/2019 1602   BUN 11 11/21/2014 1137   BUN 16 07/09/2013 1803   CREATININE 0.95 08/22/2019 1602   CREATININE 1.20 (H) 04/20/2019 1444   CALCIUM 9.5 08/22/2019 1602   CALCIUM 9.6 07/09/2013 1803   GFRNONAA >60 08/22/2019 1602   GFRNONAA 55 (L) 07/09/2013 1803   GFRAA >60 08/22/2019 1602   GFRAA >60 07/09/2013 1803    Lipid Panel     Component Value Date/Time   CHOL 266 (H) 09/01/2016 1235   TRIG 130.0 09/01/2016 1235   HDL 62.20 09/01/2016 1235   CHOLHDL 4 09/01/2016 1235   VLDL 26.0 09/01/2016 1235   LDLCALC 178 (H) 09/01/2016 1235    CBC    Component Value Date/Time   WBC 8.0 08/22/2019 1602   RBC 4.67 08/22/2019 1602   HGB 14.9 08/22/2019 1602   HGB 14.6 11/21/2014 1137   HCT 44.5 08/22/2019 1602   HCT 44.2 11/21/2014 1137   PLT 213 08/22/2019 1602   PLT 203 11/21/2014 1137   MCV 95.3 08/22/2019 1602   MCV 97 11/21/2014 1137   MCV 96 07/09/2013 1803   MCH 31.9 08/22/2019  1602   MCHC 33.5 08/22/2019 1602   RDW 13.3 08/22/2019 1602   RDW 13.6 11/21/2014 1137   RDW 13.6 07/09/2013 1803   LYMPHSABS 2.9 08/22/2019 1602   LYMPHSABS 2.3 11/21/2014 1137   MONOABS 0.4 08/22/2019 1602   EOSABS 0.2 08/22/2019 1602   EOSABS 0.2 11/21/2014 1137   BASOSABS 0.1 08/22/2019 1602   BASOSABS 0.1 11/21/2014 1137    Hgb A1C Lab Results  Component Value Date   HGBA1C 5.4 04/20/2019           Assessment &  Plan:   Swelling of Left Posterior Scalp:  She denies trauma, reports this is intermittent, comes and goes This does not appear to be an occipital lymph node CT soft tissue reviewed Could obtain US of area of concern- will check with PCP to see if this is reasonable. Toradol 30 mg IM x 1  Bronchitis:  ER notes, labs and imaging reviewed Clinically improving  Return precautions discussed Webb Silversmith, NP This visit occurred during the SARS-CoV-2 public health emergency.  Safety protocols were in place, including screening questions prior to the visit, additional usage of staff PPE, and extensive cleaning of exam room while observing appropriate contact time as indicated for disinfecting solutions.

## 2019-09-01 ENCOUNTER — Encounter: Payer: Self-pay | Admitting: Internal Medicine

## 2019-09-02 NOTE — Progress Notes (Signed)
Please ask how her symptoms are.   Options to fully evaluate her posterior scalp discomfort include ENT visit (last one was for lump under jaw so that was a different problem, or consider ultrasound of area (I would need to investigate what to order, tanks)

## 2019-09-06 ENCOUNTER — Ambulatory Visit: Payer: Medicare HMO | Admitting: Family Medicine

## 2019-09-12 ENCOUNTER — Telehealth: Payer: Self-pay | Admitting: *Deleted

## 2019-09-12 DIAGNOSIS — G8929 Other chronic pain: Secondary | ICD-10-CM

## 2019-09-12 DIAGNOSIS — R519 Headache, unspecified: Secondary | ICD-10-CM

## 2019-09-12 NOTE — Telephone Encounter (Signed)
Ultrasound ordered I will route to [facility]

## 2019-09-12 NOTE — Telephone Encounter (Signed)
-----   Message from Abner Greenspan, MD sent at 09/02/2019  9:02 PM EDT -----   ----- Message ----- From: Jearld Fenton, NP Sent: 09/01/2019   8:10 PM EDT To: Abner Greenspan, MD  What do you think about ultrasound?

## 2019-09-12 NOTE — Telephone Encounter (Signed)
Called pt and she said that the shot of Toradol didn't help the pain at all. Pt said over time the pain has improved some but not much. Pt thinks that the area is still swollen. Pt said that she does want to proceed with an [place] to see if anything is there that would be causing this issue. I advised pt PCP would put order in and our [facility] will call to schedule appt.   FYI pt wanted Dr. Glori Bickers to know she hasn't smoked in 4 weeks and her smokers cough is gone

## 2019-09-12 NOTE — Telephone Encounter (Signed)
Author: [name], Wynelle Fanny, MD Service: Family Medicine Author Type: Physician  Filed: 09/02/2019 9:03 PM Encounter Date: 08/30/2019 Status: Signed  Editor: [name], Wynelle Fanny, MD (Physician)     Please ask how her symptoms are.   Options to fully evaluate her posterior scalp discomfort include ENT visit (last one was for lump under jaw so that was a different problem, or consider ultrasound of area (I would need to investigate what to order, tanks)

## 2019-09-13 NOTE — Telephone Encounter (Signed)
[place] scheduled and patient aware.

## 2019-09-20 ENCOUNTER — Ambulatory Visit
Admission: RE | Admit: 2019-09-20 | Discharge: 2019-09-20 | Disposition: A | Discharge status: Home / Self Care | Payer: Medicare HMO | Source: Ambulatory Visit | Attending: Family Medicine | Admitting: Family Medicine

## 2019-09-20 DIAGNOSIS — R221 Localized swelling, mass and lump, neck: Secondary | ICD-10-CM | POA: Diagnosis not present

## 2019-09-20 DIAGNOSIS — R220 Localized swelling, mass and lump, head: Secondary | ICD-10-CM | POA: Diagnosis not present

## 2019-09-20 DIAGNOSIS — G8929 Other chronic pain: Secondary | ICD-10-CM

## 2019-09-20 DIAGNOSIS — R519 Headache, unspecified: Secondary | ICD-10-CM

## 2019-09-27 ENCOUNTER — Other Ambulatory Visit: Payer: Self-pay | Admitting: Gastroenterology

## 2019-10-09 ENCOUNTER — Ambulatory Visit: Payer: Medicare HMO | Admitting: Adult Health

## 2019-10-09 ENCOUNTER — Other Ambulatory Visit: Payer: Self-pay

## 2019-10-09 ENCOUNTER — Telehealth: Payer: Self-pay | Admitting: Adult Health

## 2019-10-09 ENCOUNTER — Encounter: Payer: Self-pay | Admitting: Adult Health

## 2019-10-09 VITALS — BP 111/73 | HR 75 | Ht 61.0 in | Wt 165.0 lb

## 2019-10-09 DIAGNOSIS — G35 Multiple sclerosis: Secondary | ICD-10-CM | POA: Diagnosis not present

## 2019-10-09 DIAGNOSIS — R519 Headache, unspecified: Secondary | ICD-10-CM | POA: Diagnosis not present

## 2019-10-09 DIAGNOSIS — M25541 Pain in joints of right hand: Secondary | ICD-10-CM

## 2019-10-09 NOTE — Patient Instructions (Addendum)
Your Plan:  MRI brain ordered If your symptoms worsen or you develop new symptoms please let us know.   Thank you for coming to see [place] at [facility]. I hope we have been able to provide you high quality care today.  You may receive a patient satisfaction survey over the next few weeks. We would appreciate your feedback and comments so that we may continue to improve ourselves and the health of our patients.

## 2019-10-09 NOTE — Progress Notes (Signed)
I have read the note, and I agree with the clinical assessment and plan.  Eleri Ruben K Osha Rane   

## 2019-10-09 NOTE — Telephone Encounter (Signed)
[employer] medicare order sent to [facility]. They will obtain the auth and reach out to the patient to schedule.  °

## 2019-10-09 NOTE — Progress Notes (Signed)
PATIENT: Karina [name] DOB: 1955-11-09  REASON FOR VISIT: follow up HISTORY FROM: patient  HISTORY OF PRESENT ILLNESS: Today 10/09/19:  Karina [name] is a 64 year old female with a history of multiple sclerosis.  She returns today for follow-up.  She states that she has been having new symptoms of pain in the back of the head.  She states initially it started on the left side but now it is more in the center.  She has seen her PCP who has ordered a soft tissue CT of the neck and an ultrasound of the head and neck that was relatively unremarkable.  She reports that she continues to have discomfort.  She also reports that the numbness and tingling in the feet is much worse.  She states that gabapentin is no longer beneficial.  She is even tried increasing her dose of gabapentin without benefit.  She also reports that she has been having joint pain primarily in the right hand.  She is not on any disease modifying therapy.  She returns today for an evaluation.  HISTORY 01/31/19:  Karina [name] is a 64 year old female with a history of multiple sclerosis.  She returns today for follow-up.  She is currently not on any disease modifying therapy.  Overall she states that she has done well.  She denies any new numbness or tingling.  Continues to have numbness and tingling in the toes and feet.  Feels that it may have progressed some over time but no new symptoms.  Denies any changes with her gait or balance other than at times she feels dizzy.  She states that sometimes it feels like vertigo but most the time is just an off-balance feeling.  No changes with the bowels or bladder.  No changes with her vision.  Gabapentin remains beneficial for her legs.  She does mention that she has lost a significant amount of weight in the last 3 months.  She has not seen her PCP.  She returns today for an evaluation.   REVIEW OF SYSTEMS: Out of a complete 14 system review of symptoms, the patient complains only of the  following symptoms, and all other reviewed systems are negative.  See HPI   ALLERGIES: Allergies  Allergen Reactions  . Percocet [Oxycodone-Acetaminophen] Shortness Of Breath    Felt like going to pass out and sweating  . Tecfidera [Dimethyl Fumarate] Shortness Of Breath, Palpitations, Rash and Cough  . Amitriptyline Hcl     REACTION: swelling and itching  . Atorvastatin     REACTION: elevated LFT's  . Betaseron [Interferon Beta-1b]     Headache  . Clarithromycin     REACTION: reaction not known  . Duloxetine     REACTION: pain  . Levofloxacin     REACTION: Rash  . Pregabalin     REACTION: LE swelling  . Pseudoephedrine     REACTION: legs hurt  . Zoloft [Sertraline Hcl] Other (See Comments)    Headaches     HOME MEDICATIONS: Outpatient Medications Prior to Visit  Medication Sig Dispense Refill  . albuterol (VENTOLIN HFA) 108 (90 Base) MCG/ACT inhaler INHALE 2 PUFFS UP TO EVERY 4 HOURS AS NEEDED FOR WHEEZING 3 Inhaler 3  . ALPRAZolam (XANAX) 0.5 MG tablet Take 1 tablet (0.5 mg total) by mouth 2 (two) times daily as needed. For anxiety. 30 tablet 0  . azelastine (ASTELIN) 0.1 % nasal spray Place 2 sprays into both nostrils 2 (two) times daily as needed.  12  .  Blood Glucose Monitoring Suppl (ACCU-CHEK AVIVA PLUS) w/Device KIT Check blood sugar once daily and as directed Dx R73.09 1 kit 0  . budesonide-formoterol (SYMBICORT) 160-4.5 MCG/ACT inhaler Inhale 2 puffs into the lungs 2 (two) times daily. (Patient taking differently: Inhale 2 puffs into the lungs daily as needed. ) 3 Inhaler 2  . Diclofenac Sodium 3 % GEL Place 1 application onto the skin every 12 (twelve) hours as needed. 50 g 0  . gabapentin (NEURONTIN) 300 MG capsule TAKE 1 CAPSULE BY MOUTH TWICE DURING THE DAY AND 2 CAPSULES BY MOUTH AT NIGHT 360 capsule 4  . ipratropium-albuterol (DUONEB) 0.5-2.5 (3) MG/3ML SOLN TAKE 3 MLS BY NEBULIZATION EVERY 6 (SIX) HOURS AS NEEDED. 360 mL 0  . pantoprazole (PROTONIX) 40 MG  tablet Take 40 mg by mouth 2 (two) times daily.     No facility-administered medications prior to visit.    PAST MEDICAL HISTORY: Past Medical History:  Diagnosis Date  . Allergic rhinitis   . Allergy   . Anxiety    [facility]- Eval[facility], no per pt  . Cataract   . Colon polyps 08/2008   Adenomatous  . COPD (chronic obstructive pulmonary disease) (HCC)   . Depression    no per pt  . Diabetes mellitus   . Gastritis 08/2008   H. pylori  . GERD (gastroesophageal reflux disease)   . History of shingles    Left V1  . Hypercholesteremia   . Lung nodule   . Menopausal disorder   . Multiple sclerosis (HCC)   . Neuromuscular disorder (HCC)    Multiple sclerosis  . Plantar fasciitis   . Seizure disorder (HCC)    single seizure/hypoglycemia  . Seizures (HCC)    1 seizure in 2001 due to blood sugar dropping to 38, none since  . Skin lesion 04/05/2017  . Urinary incontinence   . Vertigo     PAST SURGICAL HISTORY: Past Surgical History:  Procedure Laterality Date  . CERVICAL DISCECTOMY  2004   x 2, Anterior; Fusion C4-5, C5-6, C6-7, Dr. Donalee Citrin  . CHEST CT  11/2008   With small 4 mm nodule L lung base (rec re check in 1 year)  . CHEST CT  07/2009   Re check chest CT - lung nodule stable  . CHOLECYSTECTOMY    . COLONOSCOPY  08/2008   Polyps/ re check 5 yrs  . CT SINUS LTD W/O CM  02/2009   negative  . DILATION AND CURETTAGE OF UTERUS    . ESOPHAGOGASTRODUODENOSCOPY  08/2008   Erosive gastritis, h pylori (treated)  . FOOT SURGERY     left plantar fascial problem  . ROTATOR CUFF REPAIR     right  . TONSILLECTOMY    . TUBAL LIGATION    . UPPER GASTROINTESTINAL ENDOSCOPY      FAMILY HISTORY: Family History  Problem Relation Age of Onset  . Migraines Mother   . Heart attack Mother   . Coronary artery disease Mother   . Coronary artery disease Father        6 bypasses   . Stroke Father   . Diabetes Father   . Renal Disease Father   . Colon cancer Maternal  Grandfather 80  . Coronary artery disease Cousin   . Coronary artery disease Paternal Grandmother   . Lung cancer Paternal Aunt   . Multiple sclerosis Neg Hx   . Esophageal cancer Neg Hx   . Rectal cancer Neg Hx   . Stomach  cancer Neg Hx   . Breast cancer Neg Hx     SOCIAL HISTORY: Social History   Socioeconomic History  . Marital status: Married    Spouse name: Not on file  . Number of children: 3  . Years of education: 9 th  . Highest education level: Not on file  Occupational History  . Occupation: Disabled     Employer: DISABLED  Tobacco Use  . Smoking status: Current Every Day Smoker    Packs/day: 0.50    Years: 30.00    Pack years: 15.00    Types: Cigarettes  . Smokeless tobacco: Never Used  Substance and Sexual Activity  . Alcohol use: No    Alcohol/week: 0.0 standard drinks  . Drug use: No  . Sexual activity: Not on file  Other Topics Concern  . Not on file  Social History Narrative   Married with 3 children   Daily caffeine use - 2    Disabled.   Education 9th grade    Caffeine one cup of coffee daily.   Patient is right handed.    Social Determinants of Health   Financial Resource Strain:   . Difficulty of Paying Living Expenses:   Food Insecurity:   . Worried About Charity fundraiser in the Last Year:   . Arboriculturist in the Last Year:   Transportation Needs:   . Film/video editor (Medical):   Marland Kitchen Lack of Transportation (Non-Medical):   Physical Activity:   . Days of Exercise per Week:   . Minutes of Exercise per Session:   Stress:   . Feeling of Stress :   Social Connections:   . Frequency of Communication with Friends and Family:   . Frequency of Social Gatherings with Friends and Family:   . Attends Religious Services:   . Active Member of Clubs or Organizations:   . Attends Archivist Meetings:   Marland Kitchen Marital Status:   Intimate Partner Violence:   . Fear of Current or Ex-Partner:   . Emotionally Abused:   Marland Kitchen Physically  Abused:   . Sexually Abused:       PHYSICAL EXAM  Vitals:   10/09/19 1356  BP: 111/73  Pulse: 75  Weight: 165 lb (74.8 kg)  Height: '5\' 1"'$  (1.549 m)   Body mass index is 31.18 kg/m.  Generalized: Well developed, in no acute distress   Neurological examination  Mentation: Alert oriented to time, place, history taking. Follows all commands speech and language fluent Cranial nerve II-XII: Pupils were equal round reactive to light. Extraocular movements were full, visual field were full on confrontational test. Head turning and shoulder shrug  were normal and symmetric. Motor: The motor testing reveals 5 over 5 strength of all 4 extremities. Good symmetric motor tone is noted throughout.  Sensory: Sensory testing is intact to soft touch on all 4 extremities. No evidence of extinction is noted.  Coordination: Cerebellar testing reveals good finger-nose-finger and heel-to-shin bilaterally.  Gait and station: Gait is normal.  Reflexes: Deep tendon reflexes are symmetric and normal bilaterally.   DIAGNOSTIC DATA (LABS, IMAGING, TESTING) - I reviewed patient records, labs, notes, testing and imaging myself where available.  Lab Results  Component Value Date   WBC 8.0 08/22/2019   HGB 14.9 08/22/2019   HCT 44.5 08/22/2019   MCV 95.3 08/22/2019   PLT 213 08/22/2019      Component Value Date/Time   NA 138 08/22/2019 1602   NA 141 11/21/2014  1137   NA 136 07/09/2013 1803   K 4.6 08/22/2019 1602   K 4.2 07/09/2013 1803   CL 101 08/22/2019 1602   CL 105 07/09/2013 1803   CO2 28 08/22/2019 1602   CO2 22 07/09/2013 1803   GLUCOSE 78 08/22/2019 1602   GLUCOSE 96 07/09/2013 1803   BUN 12 08/22/2019 1602   BUN 11 11/21/2014 1137   BUN 16 07/09/2013 1803   CREATININE 0.95 08/22/2019 1602   CREATININE 1.20 (H) 04/20/2019 1444   CALCIUM 9.5 08/22/2019 1602   CALCIUM 9.6 07/09/2013 1803   PROT 7.4 04/20/2019 1444   PROT 6.9 11/21/2014 1137   ALBUMIN 4.4 08/11/2017 1644   ALBUMIN  4.3 11/21/2014 1137   AST 18 04/20/2019 1444   ALT 13 04/20/2019 1444   ALKPHOS 70 08/11/2017 1644   BILITOT 0.3 04/20/2019 1444   BILITOT 0.3 11/21/2014 1137   GFRNONAA >60 08/22/2019 1602   GFRNONAA 55 (L) 07/09/2013 1803   GFRAA >60 08/22/2019 1602   GFRAA >60 07/09/2013 1803   Lab Results  Component Value Date   CHOL 266 (H) 09/01/2016   HDL 62.20 09/01/2016   LDLCALC 178 (H) 09/01/2016   LDLDIRECT 206.5 01/24/2013   TRIG 130.0 09/01/2016   CHOLHDL 4 09/01/2016   Lab Results  Component Value Date   HGBA1C 5.4 04/20/2019   Lab Results  Component Value Date   VITAMINB12 376 04/20/2019   Lab Results  Component Value Date   TSH 1.66 04/20/2019      ASSESSMENT AND PLAN 64 y.o. year old female  has a past medical history of Allergic rhinitis, Allergy, Anxiety, Cataract, Colon polyps (08/2008), COPD (chronic obstructive pulmonary disease) ([place]), Depression, Diabetes mellitus, Gastritis (08/2008), GERD (gastroesophageal reflux disease), History of shingles, Hypercholesteremia, Lung nodule, Menopausal disorder, Multiple sclerosis ([place]), Neuromuscular disorder ([place]), Plantar fasciitis, Seizure disorder ([place]), Seizures ([place]), Skin lesion (04/05/2017), Urinary incontinence, and Vertigo. here with:  Multiple sclerosis   MRI of the brain with and without contrast  Headache vs. occipital neuralgia?   Currently on gabapentin with no benefit  Prednisone prescribed by PCP with no benefit  MRI of the brain with and without contrast ordered  Joint pain in the right hand   Follow-up with PCP   I spent 30 minutes of face-to-face and non-face-to-face time with patient.  This included previsit chart review, lab review, study review, order entry, electronic health record documentation, patient education.  Ward Givens, MSN, NP-C 10/09/2019, 2:03 PM [facility] [address], [place], [place] [address] [phone]

## 2019-10-10 ENCOUNTER — Telehealth: Payer: Self-pay | Admitting: Family Medicine

## 2019-10-10 NOTE — Telephone Encounter (Signed)
Patient called office and wanted to get scheduled for an appointment today because shes still having swelling and pain in her wrist and hands. She said shes having trouble using her hands and would like to be seen soon but Shapale wanted me to send you a note before getting her scheduled because this is something she's had problems with previously. Please contact patient on cell phone [phone].

## 2019-10-10 NOTE — Telephone Encounter (Signed)
[employer] medicare Josem Kaufmann: [id number] (exp. 10/10/19 to 04/07/20) patient is scheduled at GI for 10/19/19.

## 2019-10-10 NOTE — Telephone Encounter (Signed)
appt scheduled Friday with PCP (30 min appt)

## 2019-10-12 ENCOUNTER — Ambulatory Visit (INDEPENDENT_AMBULATORY_CARE_PROVIDER_SITE_OTHER)
Admission: RE | Admit: 2019-10-12 | Discharge: 2019-10-12 | Disposition: A | Discharge status: Home / Self Care | Payer: Medicare HMO | Source: Ambulatory Visit | Attending: Family Medicine | Admitting: Family Medicine

## 2019-10-12 ENCOUNTER — Other Ambulatory Visit: Payer: Self-pay

## 2019-10-12 ENCOUNTER — Encounter: Payer: Self-pay | Admitting: Family Medicine

## 2019-10-12 ENCOUNTER — Ambulatory Visit (INDEPENDENT_AMBULATORY_CARE_PROVIDER_SITE_OTHER): Payer: Medicare HMO | Admitting: Family Medicine

## 2019-10-12 VITALS — BP 110/70 | HR 91 | Temp 96.8°F | Ht 61.0 in | Wt 165.4 lb

## 2019-10-12 DIAGNOSIS — M25542 Pain in joints of left hand: Secondary | ICD-10-CM

## 2019-10-12 DIAGNOSIS — M25541 Pain in joints of right hand: Secondary | ICD-10-CM

## 2019-10-12 DIAGNOSIS — M79641 Pain in right hand: Secondary | ICD-10-CM

## 2019-10-12 DIAGNOSIS — M25439 Effusion, unspecified wrist: Secondary | ICD-10-CM | POA: Insufficient documentation

## 2019-10-12 DIAGNOSIS — M25432 Effusion, left wrist: Secondary | ICD-10-CM

## 2019-10-12 DIAGNOSIS — M25431 Effusion, right wrist: Secondary | ICD-10-CM

## 2019-10-12 DIAGNOSIS — M19032 Primary osteoarthritis, left wrist: Secondary | ICD-10-CM | POA: Diagnosis not present

## 2019-10-12 DIAGNOSIS — M7989 Other specified soft tissue disorders: Secondary | ICD-10-CM | POA: Diagnosis not present

## 2019-10-12 DIAGNOSIS — M79642 Pain in left hand: Secondary | ICD-10-CM | POA: Diagnosis not present

## 2019-10-12 DIAGNOSIS — M25531 Pain in right wrist: Secondary | ICD-10-CM | POA: Diagnosis not present

## 2019-10-12 NOTE — Patient Instructions (Addendum)
xrays of hands and wrists today  Also labs for arthritis and gout   If joints are stiff-use warm water  Try the diclofenac gel also   We will get back to you with results

## 2019-10-12 NOTE — Progress Notes (Signed)
Subjective:    Patient ID: Karina [name], female    DOB: [birth year], 64 y.o.   MRN: [id number]  This visit occurred during the SARS-CoV-2 public health emergency.  Safety protocols were in place, including screening questions prior to the visit, additional usage of staff PPE, and extensive cleaning of exam room while observing appropriate contact time as indicated for disinfecting solutions.    HPI Pt presents with hand /wrist pain and swelling  Wt Readings from Last 3 Encounters:  10/12/19 165 lb 7 oz (75 kg)  10/09/19 165 lb (74.8 kg)  08/30/19 154 lb 12.8 oz (70.2 kg)   31.26 kg/m  Both hands /wrists hurt -more on the R  2 weeks  No new activities   Pain both sharp and dull   No rashes    Wrist was swollen- could not see the joints this am  Also middle joints in fingers swell-cannot wear her rings  Pain is much worse in the am   Some redness/heat of R wrist   Gripping hurts/cannot close fingers   Not numb/tingling  No weakness   Patient Active Problem List   Diagnosis Date Noted  . Joint pain in both hands 10/12/2019  . Wrist swelling 10/12/2019  . Productive cough 08/22/2019  . Head pain 08/22/2019  . Elevated serum creatinine 04/24/2019  . Frequent urination 04/20/2019  . Localized swelling, mass and lump, neck 04/20/2019  . Decreased appetite 04/20/2019  . Screening mammogram, encounter for 04/20/2019  . Weight loss 02/12/2019  . Rib pain 05/05/2018  . Seborrheic keratosis, inflamed 12/15/2017  . B12 deficiency 08/24/2017  . Left low back pain 08/24/2017  . Erosive osteoarthritis of both hands 09/09/2016  . Fatigue 09/07/2016  . Bilateral hand pain 09/07/2016  . Routine general medical examination at a health care facility 05/06/2015  . Large breasts 05/06/2015  . Lumbar disc disease 11/01/2014  . Knee pain, bilateral 03/11/2014  . Thoracic back pain 11/30/2013  . Encounter for routine gynecological examination 01/31/2013  . Encounter for  Medicare annual wellness exam 01/23/2013  . Chest pain 09/20/2012  . SOB (shortness of breath) 09/20/2012  . Dyspnea 09/13/2012  . Asthmatic bronchitis , chronic ([place]) 12/06/2011  . Chronic cough 11/22/2011  . Anxiety disorder 07/17/2009  . HEMORRHOIDS-EXTERNAL 11/07/2008  . IRRITABLE BOWEL SYNDROME 11/07/2008  . Lung nodule 10/14/2008  . Prediabetes 10/14/2008  . PERSONAL HX COLONIC POLYPS 10/08/2008  . Vitamin D deficiency 07/23/2008  . SYNCOPE, HX OF 06/01/2008  . FOOT SURGERY, HX OF 06/01/2008  . DILATION AND CURETTAGE, HX OF 06/01/2008  . OSTEOARTHRITIS, SHOULDER, RIGHT 03/22/2008  . ROTATOR CUFF SYNDROME, RIGHT 03/22/2008  . ARTHRALGIA 12/12/2007  . Allergic rhinitis 08/01/2007  . Smoker 10/17/2006  . Peripheral neuropathy 10/17/2006  . FOOT PAIN, BILATERAL 10/17/2006  . HYPOGLYCEMIA 09/30/2006  . HYPERCHOLESTEROLEMIA 09/30/2006  . MULTIPLE SCLEROSIS 09/30/2006  . GERD 09/30/2006  . FIBROCYSTIC BREAST DISEASE 09/30/2006   Past Medical History:  Diagnosis Date  . Allergic rhinitis   . Allergy   . Anxiety    [facility]- [place], no per pt  . Cataract   . Colon polyps 08/2008   Adenomatous  . COPD (chronic obstructive pulmonary disease) ([place])   . Depression    no per pt  . Diabetes mellitus   . Gastritis 08/2008   H. pylori  . GERD (gastroesophageal reflux disease)   . History of shingles    Left V1  . Hypercholesteremia   . Lung nodule   .  Menopausal disorder   . Multiple sclerosis ([place])   . Neuromuscular disorder ([place])    Multiple sclerosis  . Plantar fasciitis   . Seizure disorder ([place])    single seizure/hypoglycemia  . Seizures ([place])    1 seizure in 2001 due to blood sugar dropping to 38, none since  . Skin lesion 04/05/2017  . Urinary incontinence   . Vertigo    Past Surgical History:  Procedure Laterality Date  . CERVICAL DISCECTOMY  2004   x 2, Anterior; Fusion C4-5, C5-6, C6-7, Dr. Kary Kos  . CHEST CT  11/2008   With small 4 mm nodule  L lung base (rec re check in 1 year)  . CHEST CT  07/2009   Re check chest CT - lung nodule stable  . CHOLECYSTECTOMY    . COLONOSCOPY  08/2008   Polyps/ re check 5 yrs  . CT SINUS LTD W/O CM  02/2009   negative  . DILATION AND CURETTAGE OF UTERUS    . ESOPHAGOGASTRODUODENOSCOPY  08/2008   Erosive gastritis, h pylori (treated)  . FOOT SURGERY     left plantar fascial problem  . ROTATOR CUFF REPAIR     right  . TONSILLECTOMY    . TUBAL LIGATION    . UPPER GASTROINTESTINAL ENDOSCOPY     Social History   Tobacco Use  . Smoking status: Current Every Day Smoker    Packs/day: 0.50    Years: 30.00    Pack years: 15.00    Types: Cigarettes  . Smokeless tobacco: Never Used  Substance Use Topics  . Alcohol use: No    Alcohol/week: 0.0 standard drinks  . Drug use: No   Family History  Problem Relation Age of Onset  . Migraines Mother   . Heart attack Mother   . Coronary artery disease Mother   . Coronary artery disease Father        6 bypasses   . Stroke Father   . Diabetes Father   . Renal Disease Father   . Colon cancer Maternal Grandfather 104  . Coronary artery disease Cousin   . Coronary artery disease Paternal Grandmother   . Lung cancer Paternal Aunt   . Multiple sclerosis Neg Hx   . Esophageal cancer Neg Hx   . Rectal cancer Neg Hx   . Stomach cancer Neg Hx   . Breast cancer Neg Hx    Allergies  Allergen Reactions  . Percocet [Oxycodone-Acetaminophen] Shortness Of Breath    Felt like going to pass out and sweating  . Tecfidera [Dimethyl Fumarate] Shortness Of Breath, Palpitations, Rash and Cough  . Amitriptyline Hcl     REACTION: swelling and itching  . Atorvastatin     REACTION: elevated LFT's  . Betaseron [Interferon Beta-1b]     Headache  . Clarithromycin     REACTION: reaction not known  . Duloxetine     REACTION: pain  . Levofloxacin     REACTION: Rash  . Pregabalin     REACTION: LE swelling  . Pseudoephedrine     REACTION: legs hurt  .  Zoloft [Sertraline Hcl] Other (See Comments)    Headaches    Current Outpatient Medications on File Prior to Visit  Medication Sig Dispense Refill  . albuterol (VENTOLIN HFA) 108 (90 Base) MCG/ACT inhaler INHALE 2 PUFFS UP TO EVERY 4 HOURS AS NEEDED FOR WHEEZING 3 Inhaler 3  . ALPRAZolam (XANAX) 0.5 MG tablet Take 1 tablet (0.5 mg total) by mouth 2 (two)  times daily as needed. For anxiety. 30 tablet 0  . azelastine (ASTELIN) 0.1 % nasal spray Place 2 sprays into both nostrils 2 (two) times daily as needed.  12  . Blood Glucose Monitoring Suppl (ACCU-CHEK AVIVA PLUS) w/Device KIT Check blood sugar once daily and as directed Dx R73.09 1 kit 0  . budesonide-formoterol (SYMBICORT) 160-4.5 MCG/ACT inhaler Inhale 2 puffs into the lungs 2 (two) times daily. (Patient taking differently: Inhale 2 puffs into the lungs daily as needed. ) 3 Inhaler 2  . Diclofenac Sodium 3 % GEL Place 1 application onto the skin every 12 (twelve) hours as needed. 50 g 0  . gabapentin (NEURONTIN) 300 MG capsule TAKE 1 CAPSULE BY MOUTH TWICE DURING THE DAY AND 2 CAPSULES BY MOUTH AT NIGHT 360 capsule 4  . ipratropium-albuterol (DUONEB) 0.5-2.5 (3) MG/3ML SOLN TAKE 3 MLS BY NEBULIZATION EVERY 6 (SIX) HOURS AS NEEDED. 360 mL 0  . pantoprazole (PROTONIX) 40 MG tablet Take 40 mg by mouth 2 (two) times daily.     No current facility-administered medications on file prior to visit.    Review of Systems  Constitutional: Positive for fatigue. Negative for activity change, appetite change, fever and unexpected weight change.  HENT: Negative for congestion, ear pain, rhinorrhea, sinus pressure and sore throat.   Eyes: Negative for pain, redness and visual disturbance.  Respiratory: Negative for cough, shortness of breath and wheezing.   Cardiovascular: Negative for chest pain and palpitations.  Gastrointestinal: Negative for abdominal pain, blood in stool, constipation and diarrhea.  Endocrine: Negative for polydipsia and polyuria.   Genitourinary: Negative for dysuria, frequency and urgency.  Musculoskeletal: Positive for arthralgias, back pain, joint swelling and neck pain. Negative for myalgias.  Skin: Negative for pallor and rash.  Allergic/Immunologic: Negative for environmental allergies.  Neurological: Positive for headaches. Negative for dizziness and syncope.  Hematological: Negative for adenopathy. Does not bruise/bleed easily.  Psychiatric/Behavioral: Positive for dysphoric mood. Negative for decreased concentration. The patient is not nervous/anxious.        Objective:   Physical Exam Constitutional:      General: She is not in acute distress.    Appearance: Normal appearance. She is obese. She is not ill-appearing or diaphoretic.  HENT:     Head: Normocephalic and atraumatic.     Mouth/Throat:     Mouth: Mucous membranes are moist.  Eyes:     General: No scleral icterus.    Conjunctiva/sclera: Conjunctivae normal.     Pupils: Pupils are equal, round, and reactive to light.  Neck:     Vascular: No carotid bruit.  Cardiovascular:     Rate and Rhythm: Regular rhythm. Tachycardia present.     Pulses: Normal pulses.     Heart sounds: Normal heart sounds.  Pulmonary:     Effort: Pulmonary effort is normal.     Comments: Diffusely distant bs No wheezing  Musculoskeletal:        General: No deformity.     Right wrist: Normal pulse.     Left wrist: Normal pulse.     Right hand: Normal capillary refill. Normal pulse.     Left hand: Normal capillary refill. Normal pulse.     Cervical back: Normal range of motion and neck supple.     Comments: Bilateral hands- tenderness in phalanges /pip joints No deformity or ulnar deviation  Unable to appreciate acute joint swelling  Unable to grip due to pain  No crepitus   Bilateral wrists -mild swelling (worse on R)  with most pain to flex wrists fully  No crepitus   No elbow tenderness Some tenderness in shoulder girdle  Lymphadenopathy:     Cervical:  No cervical adenopathy.  Skin:    General: Skin is warm and dry.     Coloration: Skin is not pale.     Findings: No erythema or rash.     Comments: Hair loss in crown area  Neurological:     Mental Status: She is alert.     Sensory: No sensory deficit.     Coordination: Coordination normal.  Psychiatric:        Mood and Affect: Mood is anxious.     Comments: Mildly anxious           Assessment & Plan:   Problem List Items Addressed This Visit      Other   Bilateral hand pain   Joint pain in both hands - Primary    With tenderness and swelling  Worse in the past 2 wk  No trauma  xrays ordered  Labs for auto imm joint dz and gout  Recommend tylenol prn pain  Warm compress/water esp when stiff in am  Diclofenac gel prn         Relevant Orders   DG Hand Complete Right (Completed)   DG Hand Complete Left (Completed)   Rheumatoid factor (Completed)   ANA   Sedimentation Rate (Completed)   Uric acid (Completed)   Wrist swelling    With pain/tenderness  Limits activity  Worse in R (dominant hand)  xrays ordered Also labs for joint autoimm dz and gout      Relevant Orders   DG Wrist Complete Right (Completed)   DG Wrist Complete Left (Completed)   Rheumatoid factor (Completed)   ANA   Sedimentation Rate (Completed)   Uric acid (Completed)

## 2019-10-13 NOTE — Assessment & Plan Note (Addendum)
With tenderness and swelling  Worse in the past 2 wk  No trauma  xrays ordered  Labs for auto imm joint dz and gout  Recommend tylenol prn pain  Warm compress/water esp when stiff in am  Diclofenac gel prn

## 2019-10-13 NOTE — Assessment & Plan Note (Signed)
With pain/tenderness  Limits activity  Worse in R (dominant hand)  xrays ordered Also labs for joint autoimm dz and gout

## 2019-10-16 ENCOUNTER — Telehealth: Payer: Self-pay | Admitting: *Deleted

## 2019-10-16 LAB — RHEUMATOID FACTOR: Rheumatoid fact SerPl-aCnc: <14 IU/mL (ref <14)

## 2019-10-16 LAB — ANTI-NUCLEAR AB-TITER (ANA TITER)
ANA TITER: 1:40 {titer} — ABNORMAL HIGH
ANA Titer 1: 1:40 {titer} — ABNORMAL HIGH

## 2019-10-16 LAB — ANA: Anti Nuclear Antibody (ANA): POSITIVE — AB

## 2019-10-16 LAB — URIC ACID: Uric Acid, Serum: 4.7 mg/dL (ref 2.5–7.0)

## 2019-10-16 LAB — SEDIMENTATION RATE: Sed Rate: 36 mm/h — ABNORMAL HIGH (ref 0–30)

## 2019-10-16 NOTE — Telephone Encounter (Signed)
Left VM on home and cell requesting pt to call office back regarding ANA lab result

## 2019-10-17 ENCOUNTER — Ambulatory Visit (INDEPENDENT_AMBULATORY_CARE_PROVIDER_SITE_OTHER): Payer: Medicare HMO

## 2019-10-17 ENCOUNTER — Other Ambulatory Visit: Payer: Self-pay

## 2019-10-17 ENCOUNTER — Telehealth: Payer: Self-pay | Admitting: Family Medicine

## 2019-10-17 DIAGNOSIS — M25431 Effusion, right wrist: Secondary | ICD-10-CM

## 2019-10-17 DIAGNOSIS — M25432 Effusion, left wrist: Secondary | ICD-10-CM

## 2019-10-17 DIAGNOSIS — Z0000 Encounter for general adult medical examination without abnormal findings: Secondary | ICD-10-CM

## 2019-10-17 DIAGNOSIS — M79641 Pain in right hand: Secondary | ICD-10-CM

## 2019-10-17 DIAGNOSIS — M79642 Pain in left hand: Secondary | ICD-10-CM

## 2019-10-17 NOTE — Progress Notes (Signed)
 Subjective:   Karina [name] is a 63 y.o. female who presents for Medicare Annual (Subsequent) preventive examination.  Review of Systems: N/A      I connected with the patient today by telephone and verified that I am speaking with the correct person using two identifiers. Location patient: home Location nurse: work Persons participating in the virtual visit: patient, nurse.   I discussed the limitations, risks, security and privacy concerns of performing an evaluation and management service by telephone and the availability of in person appointments. I also discussed with the patient that there may be a patient responsible charge related to this service. The patient expressed understanding and verbally consented to this telephonic visit.    Interactive audio and video telecommunications were attempted between this nurse and patient, however failed, due to patient having technical difficulties OR patient did not have access to video capability.  We continued and completed visit with audio only.     Cardiac Risk Factors include: Other (see comment), Risk factor comments: hypercholesterolemia     Objective:    Today's Vitals   10/17/19 1228  PainSc: 10-Worst pain ever   There is no height or weight on file to calculate BMI.  Advanced Directives 10/17/2019 08/22/2019 06/03/2017 03/31/2017 09/01/2016 06/18/2016 04/30/2015  Does Patient Have a Medical Advance Directive? No No Yes No No No No  Type of Advance Directive - - Living will - - - -  Does patient want to make changes to medical advance directive? No - Patient declined - No - Patient declined - - - -  Would patient like information on creating a medical advance directive? - - - No - Patient declined - No - Patient declined Yes - Educational materials given    Current Medications (verified) Outpatient Encounter Medications as of 10/17/2019  Medication Sig  . albuterol (VENTOLIN HFA) 108 (90 Base) MCG/ACT inhaler INHALE 2 PUFFS UP  TO EVERY 4 HOURS AS NEEDED FOR WHEEZING  . ALPRAZolam (XANAX) 0.5 MG tablet Take 1 tablet (0.5 mg total) by mouth 2 (two) times daily as needed. For anxiety.  . azelastine (ASTELIN) 0.1 % nasal spray Place 2 sprays into both nostrils 2 (two) times daily as needed.  . Blood Glucose Monitoring Suppl (ACCU-CHEK AVIVA PLUS) w/Device KIT Check blood sugar once daily and as directed Dx R73.09  . budesonide-formoterol (SYMBICORT) 160-4.5 MCG/ACT inhaler Inhale 2 puffs into the lungs 2 (two) times daily. (Patient taking differently: Inhale 2 puffs into the lungs daily as needed. )  . Diclofenac Sodium 3 % GEL Place 1 application onto the skin every 12 (twelve) hours as needed.  . gabapentin (NEURONTIN) 300 MG capsule TAKE 1 CAPSULE BY MOUTH TWICE DURING THE DAY AND 2 CAPSULES BY MOUTH AT NIGHT  . ipratropium-albuterol (DUONEB) 0.5-2.5 (3) MG/3ML SOLN TAKE 3 MLS BY NEBULIZATION EVERY 6 (SIX) HOURS AS NEEDED.  . pantoprazole (PROTONIX) 40 MG tablet Take 40 mg by mouth 2 (two) times daily.   No facility-administered encounter medications on file as of 10/17/2019.    Allergies (verified) Percocet [oxycodone-acetaminophen], Tecfidera [dimethyl fumarate], Amitriptyline hcl, Atorvastatin, Betaseron [interferon beta-1b], Clarithromycin, Duloxetine, Levofloxacin, Pregabalin, Pseudoephedrine, and Zoloft [sertraline hcl]   History: Past Medical History:  Diagnosis Date  . Allergic rhinitis   . Allergy   . Anxiety    [place], no per pt  . Cataract   . Colon polyps 08/2008   Adenomatous  . COPD (chronic obstructive pulmonary disease) (HCC)   . Depression      no per pt  . Diabetes mellitus   . Gastritis 08/2008   H. pylori  . GERD (gastroesophageal reflux disease)   . History of shingles    Left V1  . Hypercholesteremia   . Lung nodule   . Menopausal disorder   . Multiple sclerosis ([place])   . Neuromuscular disorder ([place])    Multiple sclerosis  . Plantar fasciitis   . Seizure disorder  ([place])    single seizure/hypoglycemia  . Seizures ([place])    1 seizure in 2001 due to blood sugar dropping to 38, none since  . Skin lesion 04/05/2017  . Urinary incontinence   . Vertigo    Past Surgical History:  Procedure Laterality Date  . CERVICAL DISCECTOMY  2004   x 2, Anterior; Fusion C4-5, C5-6, C6-7, Dr. Kary Kos  . CHEST CT  11/2008   With small 4 mm nodule L lung base (rec re check in 1 year)  . CHEST CT  07/2009   Re check chest CT - lung nodule stable  . CHOLECYSTECTOMY    . COLONOSCOPY  08/2008   Polyps/ re check 5 yrs  . CT SINUS LTD W/O CM  02/2009   negative  . DILATION AND CURETTAGE OF UTERUS    . ESOPHAGOGASTRODUODENOSCOPY  08/2008   Erosive gastritis, h pylori (treated)  . FOOT SURGERY     left plantar fascial problem  . ROTATOR CUFF REPAIR     right  . TONSILLECTOMY    . TUBAL LIGATION    . UPPER GASTROINTESTINAL ENDOSCOPY     Family History  Problem Relation Age of Onset  . Migraines Mother   . Heart attack Mother   . Coronary artery disease Mother   . Coronary artery disease Father        6 bypasses   . Stroke Father   . Diabetes Father   . Renal Disease Father   . Colon cancer Maternal Grandfather 63  . Coronary artery disease Cousin   . Coronary artery disease Paternal Grandmother   . Lung cancer Paternal Aunt   . Multiple sclerosis Neg Hx   . Esophageal cancer Neg Hx   . Rectal cancer Neg Hx   . Stomach cancer Neg Hx   . Breast cancer Neg Hx    Social History   Socioeconomic History  . Marital status: Married    Spouse name: Not on file  . Number of children: 3  . Years of education: 9 th  . Highest education level: Not on file  Occupational History  . Occupation: Disabled     Employer: DISABLED  Tobacco Use  . Smoking status: Former Smoker    Packs/day: 0.50    Years: 30.00    Pack years: 15.00    Types: Cigarettes    Quit date: 06/17/2019    Years since quitting: 0.3  . Smokeless tobacco: Never Used  Substance and Sexual  Activity  . Alcohol use: No    Alcohol/week: 0.0 standard drinks  . Drug use: No  . Sexual activity: Not on file  Other Topics Concern  . Not on file  Social History Narrative   Married with 3 children   Daily caffeine use - 2    Disabled.   Education 9th grade    Caffeine one cup of coffee daily.   Patient is right handed.    Social Determinants of Health   Financial Resource Strain: Low Risk   . Difficulty of Paying Living Expenses: Not hard  at all  Food Insecurity: No Food Insecurity  . Worried About Charity fundraiser in the Last Year: Never true  . Ran Out of Food in the Last Year: Never true  Transportation Needs: No Transportation Needs  . Lack of Transportation (Medical): No  . Lack of Transportation (Non-Medical): No  Physical Activity: Inactive  . Days of Exercise per Week: 0 days  . Minutes of Exercise per Session: 0 min  Stress: No Stress Concern Present  . Feeling of Stress : Not at all  Social Connections:   . Frequency of Communication with Friends and Family:   . Frequency of Social Gatherings with Friends and Family:   . Attends Religious Services:   . Active Member of Clubs or Organizations:   . Attends Archivist Meetings:   Marland Kitchen Marital Status:     Tobacco Counseling Counseling given: Not Answered   Clinical Intake:  Pre-visit preparation completed: Yes  Pain : 0-10 Pain Score: 10-Worst pain ever Pain Type: Chronic pain Pain Location: Hand Pain Orientation: Left, Right Pain Descriptors / Indicators: Aching Pain Onset: More than a month ago Pain Frequency: Constant     Nutritional Risks: None Diabetes: No  How often do you need to have someone help you when you read instructions, pamphlets, or other written materials from your doctor or pharmacy?: 1 - Never What is the last grade level you completed in school?: 9th  Diabetic: no Nutrition Risk Assessment:  Has the patient had any N/V/D within the last 2 months?  No  Does  the patient have any non-healing wounds?  No  Has the patient had any unintentional weight loss or weight gain?  No   Diabetes:  Is the patient diabetic?  No  If diabetic, was a CBG obtained today?  N/A Did the patient bring in their glucometer from home?  N/A How often do you monitor your CBG's? N/A.   Financial Strains and Diabetes Management:  Are you having any financial strains with the device, your supplies or your medication? N/A.  Does the patient want to be seen by Chronic Care Management for management of their diabetes?  N/A Would the patient like to be referred to a Nutritionist or for Diabetic Management?  N/A    Interpreter Needed?: No  Information entered by :: CJohnson, LPN   Activities of Daily Living In your present state of health, do you have any difficulty performing the following activities: 10/17/2019  Hearing? N  Vision? N  Difficulty concentrating or making decisions? N  Walking or climbing stairs? N  Dressing or bathing? Y  Doing errands, shopping? N  Preparing Food and eating ? Y  Using the Toilet? N  In the past six months, have you accidently leaked urine? Y  Comment wears pads  Do you have problems with loss of bowel control? N  Managing your Medications? N  Managing your Finances? N  Housekeeping or managing your Housekeeping? Y  Some recent data might be hidden    Patient Care Team: [name], Wynelle Fanny, MD as PCP - General Kathrynn Ducking, MD as Consulting Physician (Neurology) Minna Merritts, MD as Consulting Physician (Cardiology)  Indicate any recent Medical Services you may have received from other than [facility] providers in the past year (date may be approximate).     Assessment:   This is a routine wellness examination for Marly.  Hearing/Vision screen  Hearing Screening   125Hz 250Hz 500Hz 1000Hz 2000Hz 3000Hz 4000Hz 6000Hz 8000Hz  Right ear:           Left ear:           Vision Screening Comments: Advised patient to get  annual eye exams. Has not had one in a while.   Dietary issues and exercise activities discussed: Current Exercise Habits: The patient does not participate in regular exercise at present, Exercise limited by: None identified  Goals    . Patient Stated     10/17/2019, I will maintain and continue medications as prescribed.     . Weight < 180 lb (81.647 kg)     Pt desires to lose weight by increasing the amount of physical activity she does once SOB, back pain, and fatigue are resolved.       Depression Screen PHQ 2/9 Scores 10/17/2019 09/01/2016 04/30/2015 01/31/2013  PHQ - 2 Score 0 0 0 2  PHQ- 9 Score 0 - - -    Fall Risk Fall Risk  10/17/2019 09/21/2016 09/01/2016 04/30/2015 01/31/2013  Falls in the past year? 1 No No No Yes  Comment tripped and fell in doorway - - - -  Number falls in past yr: 1 - - - 1  Injury with Fall? 0 - - - Yes  Risk for fall due to : Medication side effect - - - -  Follow up Falls evaluation completed;Falls prevention discussed - - - -    Any stairs in or around the home? Yes  If so, are there any without handrails? No  Home free of loose throw rugs in walkways, pet beds, electrical cords, etc? Yes  Adequate lighting in your home to reduce risk of falls? Yes   ASSISTIVE DEVICES UTILIZED TO PREVENT FALLS:  Life alert? No  Use of a cane, walker or w/c? No  Grab bars in the bathroom? No  Shower chair or bench in shower? No  Elevated toilet seat or a handicapped toilet? No   TIMED UP AND GO:  Was the test performed? N/A, telephonic visit.    Cognitive Function: MMSE - Mini Mental State Exam 10/17/2019 09/01/2016 04/30/2015  Orientation to time _0 Orientation to Place _1 Registration _2 Attention/ Calculation 5 0 5  Recall _3 Language- name 2 objects - 0 -  Language- repeat _4 Language- follow 3 step command - 3 3  Language- read & follow direction - 0 1  Write a sentence - 0 -  Copy design - 0 -  Total score - 20 -  Mini Cog   Mini-Cog screen was completed. Maximum score is 22. A value of 0 denotes this part of the MMSE was not completed or the patient failed this part of the Mini-Cog screening.       Immunizations Immunization History  Administered Date(s) Administered  . Influenza,inj,Quad PF,6+ Mos 01/31/2013, 03/11/2014, 12/15/2017  . Pneumococcal Polysaccharide-23 07/29/2003  . Td 03/08/2000  . Tdap 01/31/2013    TDAP status: Up to date Flu Vaccine status: due, will be available at the office at the end of August Pneumococcal vaccine status: Up to date Covid-19 vaccine status: Declined, Education has been provided regarding the importance of this vaccine but patient still declined. Advised may receive this vaccine at local pharmacy or Health Dept.or vaccine clinic. Aware to provide a copy of the vaccination record if obtained from local pharmacy or Health Dept. Verbalized acceptance and understanding.  Qualifies for Shingles Vaccine? Yes   Zostavax  completed No   Shingrix Completed?: No.    Education has been provided regarding the importance of this vaccine. Patient has been advised to call insurance company to determine out of pocket expense if they have not yet received this vaccine. Advised may also receive vaccine at local pharmacy or Health Dept. Verbalized acceptance and understanding.  Screening Tests Health Maintenance  Topic Date Due  . PAP SMEAR-Modifier  05/05/2018  . INFLUENZA VACCINE  10/07/2019  . COVID-19 Vaccine (1) 11/02/2019 (Originally 01/19/1968)  . COLONOSCOPY  10/16/2020 (Originally 10/12/2018)  . MAMMOGRAM  05/17/2020  . TETANUS/TDAP  02/01/2023  . Hepatitis C Screening  Completed  . HIV Screening  Completed    Health Maintenance  Health Maintenance Due  Topic Date Due  . PAP SMEAR-Modifier  05/05/2018  . INFLUENZA VACCINE  10/07/2019    Colorectal cancer screening: declined at this time Mammogram status: Completed 05/18/2019. Repeat every year Bone Density  status:N/A, at age 89  Lung Cancer Screening: (Low Dose CT Chest recommended if Age 56-80 years, 30 pack-year currently smoking OR have quit w/in 15years.) does not qualify.    Additional Screening:  Hepatitis C Screening: does qualify; Completed 04/30/2015  Vision Screening: Recommended annual ophthalmology exams for early detection of glaucoma and other disorders of the eye. Is the patient up to date with their annual eye exam?  No  Who is the provider or what is the name of the office in which the patient attends annual eye exams? Will set up appointment once she finds an eye doctor she wants to go too.  If pt is not established with a provider, would they like to be referred to a provider to establish care? No .   Dental Screening: Recommended annual dental exams for proper oral hygiene  Community Resource Referral / Chronic Care Management: CRR required this visit?  No   CCM required this visit?  No      Plan:     I have personally reviewed and noted the following in the patient's chart:   . Medical and social history . Use of alcohol, tobacco or illicit drugs  . Current medications and supplements . Functional ability and status . Nutritional status . Physical activity . Advanced directives . List of other physicians . Hospitalizations, surgeries, and ER visits in previous 12 months . Vitals . Screenings to include cognitive, depression, and falls . Referrals and appointments  In addition, I have reviewed and discussed with patient certain preventive protocols, quality metrics, and best practice recommendations. A written personalized care plan for preventive services as well as general preventive health recommendations were provided to patient.   Due to this being a telephonic visit, the after visit summary with patients personalized plan was offered to patient via mail or my-chart. Patient preferred to pick up at office at next visit.   Andrez Grime, LPN    [id number]

## 2019-10-17 NOTE — Telephone Encounter (Signed)
Noted  

## 2019-10-17 NOTE — Telephone Encounter (Signed)
Addressed through result notes  

## 2019-10-17 NOTE — Telephone Encounter (Signed)
-----   Message from Tammi Sou, [place] sent at 10/17/2019 12:28 PM EDT ----- Pt notified of lab results and DR. Earlean Fidalgo's comments. Pt agrees with rheum referral. She said she will see anybody in either city she just would like an appt asap because her hands are worsening and the pain is worsening. Pt said she couldn't even grip a comb to comb her hair today

## 2019-10-17 NOTE — Telephone Encounter (Signed)
Pt returned your call.  

## 2019-10-17 NOTE — Telephone Encounter (Signed)
Referral done Will route to [facility]  

## 2019-10-17 NOTE — Patient Instructions (Signed)
Karina [name] , Thank you for taking time to come for your Medicare Wellness Visit. I appreciate your ongoing commitment to your health goals. Please review the following plan we discussed and let me know if I can assist you in the future.   Screening recommendations/referrals: Colonoscopy: declined at this time Mammogram: Up to date, completed 05/18/2019 Bone Density: at age 64 Recommended yearly ophthalmology/optometry visit for glaucoma screening and checkup Recommended yearly dental visit for hygiene and checkup  Vaccinations: Influenza vaccine: due will be available at the office at the end of August Pneumococcal vaccine: Up to date, completed 07/29/2003, due at age 14 Tdap vaccine: Up to date, completed 01/31/2013, due 01/2023 Shingles vaccine: due, check with your insurance regarding coverage  Covid-19: declined  Advanced directives: Advance directive discussed with you today. Even though you declined this today please call our office should you change your mind and we can give you the proper paperwork for you to fill out.   Conditions/risks identified: hypercholesterolemia  Next appointment: Follow up in one year for your annual wellness visit.   Preventive Care 40-64 Years, Female Preventive care refers to lifestyle choices and visits with your health care provider that can promote health and wellness. What does preventive care include?  A yearly physical exam. This is also called an annual well check.  Dental exams once or twice a year.  Routine eye exams. Ask your health care provider how often you should have your eyes checked.  Personal lifestyle choices, including:  Daily care of your teeth and gums.  Regular physical activity.  Eating a healthy diet.  Avoiding tobacco and drug use.  Limiting alcohol use.  Practicing safe sex.  Taking low-dose aspirin daily starting at age 60.  Taking vitamin and mineral supplements as recommended by your health care  provider. What happens during an annual well check? The services and screenings done by your health care provider during your annual well check will depend on your age, overall health, lifestyle risk factors, and family history of disease. Counseling  Your health care provider may ask you questions about your:  Alcohol use.  Tobacco use.  Drug use.  Emotional well-being.  Home and relationship well-being.  Sexual activity.  Eating habits.  Work and work Statistician.  Method of birth control.  Menstrual cycle.  Pregnancy history. Screening  You may have the following tests or measurements:  Height, weight, and BMI.  Blood pressure.  Lipid and cholesterol levels. These may be checked every 5 years, or more frequently if you are over 4 years old.  Skin check.  Lung cancer screening. You may have this screening every year starting at age 33 if you have a 30-pack-year history of smoking and currently smoke or have quit within the past 15 years.  Fecal occult blood test (FOBT) of the stool. You may have this test every year starting at age 83.  Flexible sigmoidoscopy or colonoscopy. You may have a sigmoidoscopy every 5 years or a colonoscopy every 10 years starting at age 75.  Hepatitis C blood test.  Hepatitis B blood test.  Sexually transmitted disease (STD) testing.  Diabetes screening. This is done by checking your blood sugar (glucose) after you have not eaten for a while (fasting). You may have this done every 1-3 years.  Mammogram. This may be done every 1-2 years. Talk to your health care provider about when you should start having regular mammograms. This may depend on whether you have a family history of breast cancer.  BRCA-related cancer screening. This may be done if you have a family history of breast, ovarian, tubal, or peritoneal cancers.  Pelvic exam and Pap test. This may be done every 3 years starting at age 62. Starting at age 58, this may be  done every 5 years if you have a Pap test in combination with an HPV test.  Bone density scan. This is done to screen for osteoporosis. You may have this scan if you are at high risk for osteoporosis. Discuss your test results, treatment options, and if necessary, the need for more tests with your health care provider. Vaccines  Your health care provider may recommend certain vaccines, such as:  Influenza vaccine. This is recommended every year.  Tetanus, diphtheria, and acellular pertussis (Tdap, Td) vaccine. You may need a Td booster every 10 years.  Zoster vaccine. You may need this after age 69.  Pneumococcal 13-valent conjugate (PCV13) vaccine. You may need this if you have certain conditions and were not previously vaccinated.  Pneumococcal polysaccharide (PPSV23) vaccine. You may need one or two doses if you smoke cigarettes or if you have certain conditions. Talk to your health care provider about which screenings and vaccines you need and how often you need them. This information is not intended to replace advice given to you by your health care provider. Make sure you discuss any questions you have with your health care provider. Document Released: 03/21/2015 Document Revised: 11/12/2015 Document Reviewed: 12/24/2014 Elsevier Interactive Patient Education  2017 [place] Prevention in the Home Falls can cause injuries. They can happen to people of all ages. There are many things you can do to make your home safe and to help prevent falls. What can I do on the outside of my home?  Regularly fix the edges of walkways and driveways and fix any cracks.  Remove anything that might make you trip as you walk through a door, such as a raised step or threshold.  Trim any bushes or trees on the path to your home.  Use bright outdoor lighting.  Clear any walking paths of anything that might make someone trip, such as rocks or tools.  Regularly check to see if  handrails are loose or broken. Make sure that both sides of any steps have handrails.  Any raised decks and porches should have guardrails on the edges.  Have any leaves, snow, or ice cleared regularly.  Use sand or salt on walking paths during winter.  Clean up any spills in your garage right away. This includes oil or grease spills. What can I do in the bathroom?  Use night lights.  Install grab bars by the toilet and in the tub and shower. Do not use towel bars as grab bars.  Use non-skid mats or decals in the tub or shower.  If you need to sit down in the shower, use a plastic, non-slip stool.  Keep the floor dry. Clean up any water that spills on the floor as soon as it happens.  Remove soap buildup in the tub or shower regularly.  Attach bath mats securely with double-sided non-slip rug tape.  Do not have throw rugs and other things on the floor that can make you trip. What can I do in the bedroom?  Use night lights.  Make sure that you have a light by your bed that is easy to reach.  Do not use any sheets or blankets that are too big for your bed. They  should not hang down onto the floor.  Have a firm chair that has side arms. You can use this for support while you get dressed.  Do not have throw rugs and other things on the floor that can make you trip. What can I do in the kitchen?  Clean up any spills right away.  Avoid walking on wet floors.  Keep items that you use a lot in easy-to-reach places.  If you need to reach something above you, use a strong step stool that has a grab bar.  Keep electrical cords out of the way.  Do not use floor polish or wax that makes floors slippery. If you must use wax, use non-skid floor wax.  Do not have throw rugs and other things on the floor that can make you trip. What can I do with my stairs?  Do not leave any items on the stairs.  Make sure that there are handrails on both sides of the stairs and use them. Fix  handrails that are broken or loose. Make sure that handrails are as long as the stairways.  Check any carpeting to make sure that it is firmly attached to the stairs. Fix any carpet that is loose or worn.  Avoid having throw rugs at the top or bottom of the stairs. If you do have throw rugs, attach them to the floor with carpet tape.  Make sure that you have a light switch at the top of the stairs and the bottom of the stairs. If you do not have them, ask someone to add them for you. What else can I do to help prevent falls?  Wear shoes that:  Do not have high heels.  Have rubber bottoms.  Are comfortable and fit you well.  Are closed at the toe. Do not wear sandals.  If you use a stepladder:  Make sure that it is fully opened. Do not climb a closed stepladder.  Make sure that both sides of the stepladder are locked into place.  Ask someone to hold it for you, if possible.  Clearly mark and make sure that you can see:  Any grab bars or handrails.  First and last steps.  Where the edge of each step is.  Use tools that help you move around (mobility aids) if they are needed. These include:  Canes.  Walkers.  Scooters.  Crutches.  Turn on the lights when you go into a dark area. Replace any light bulbs as soon as they burn out.  Set up your furniture so you have a clear path. Avoid moving your furniture around.  If any of your floors are uneven, fix them.  If there are any pets around you, be aware of where they are.  Review your medicines with your doctor. Some medicines can make you feel dizzy. This can increase your chance of falling. Ask your doctor what other things that you can do to help prevent falls. This information is not intended to replace advice given to you by your health care provider. Make sure you discuss any questions you have with your health care provider. Document Released: 12/19/2008 Document Revised: 07/31/2015 Document Reviewed:  03/29/2014 Elsevier Interactive Patient Education  2017 [employer].

## 2019-10-17 NOTE — Progress Notes (Signed)
PCP notes:  Health Maintenance: Covid- declined Colonoscopy- declined at this time   Abnormal Screenings: none   Patient concerns: Bilateral hand pain    Nurse concerns: none   Next PCP appt.: none

## 2019-10-19 ENCOUNTER — Ambulatory Visit
Admission: RE | Admit: 2019-10-19 | Discharge: 2019-10-19 | Disposition: A | Discharge status: Home / Self Care | Payer: Medicare HMO | Source: Ambulatory Visit | Attending: Adult Health | Admitting: Adult Health

## 2019-10-19 DIAGNOSIS — G35 Multiple sclerosis: Secondary | ICD-10-CM

## 2019-10-22 DIAGNOSIS — Z9049 Acquired absence of other specified parts of digestive tract: Secondary | ICD-10-CM | POA: Diagnosis not present

## 2019-10-22 DIAGNOSIS — I700 Atherosclerosis of aorta: Secondary | ICD-10-CM | POA: Diagnosis not present

## 2019-10-22 DIAGNOSIS — M1990 Unspecified osteoarthritis, unspecified site: Secondary | ICD-10-CM | POA: Diagnosis not present

## 2019-10-22 DIAGNOSIS — M546 Pain in thoracic spine: Secondary | ICD-10-CM | POA: Diagnosis not present

## 2019-10-22 DIAGNOSIS — R768 Other specified abnormal immunological findings in serum: Secondary | ICD-10-CM | POA: Diagnosis not present

## 2019-10-22 DIAGNOSIS — M549 Dorsalgia, unspecified: Secondary | ICD-10-CM | POA: Diagnosis not present

## 2019-10-22 DIAGNOSIS — M79672 Pain in left foot: Secondary | ICD-10-CM | POA: Diagnosis not present

## 2019-10-22 DIAGNOSIS — M542 Cervicalgia: Secondary | ICD-10-CM | POA: Diagnosis not present

## 2019-10-22 DIAGNOSIS — D8989 Other specified disorders involving the immune mechanism, not elsewhere classified: Secondary | ICD-10-CM | POA: Diagnosis not present

## 2019-10-22 DIAGNOSIS — L929 Granulomatous disorder of the skin and subcutaneous tissue, unspecified: Secondary | ICD-10-CM | POA: Diagnosis not present

## 2019-10-22 DIAGNOSIS — M545 Low back pain: Secondary | ICD-10-CM | POA: Diagnosis not present

## 2019-10-22 DIAGNOSIS — M064 Inflammatory polyarthropathy: Secondary | ICD-10-CM | POA: Diagnosis not present

## 2019-10-22 DIAGNOSIS — M79643 Pain in unspecified hand: Secondary | ICD-10-CM | POA: Diagnosis not present

## 2019-10-22 DIAGNOSIS — M7989 Other specified soft tissue disorders: Secondary | ICD-10-CM | POA: Diagnosis not present

## 2019-10-22 DIAGNOSIS — M533 Sacrococcygeal disorders, not elsewhere classified: Secondary | ICD-10-CM | POA: Diagnosis not present

## 2019-10-22 DIAGNOSIS — M19071 Primary osteoarthritis, right ankle and foot: Secondary | ICD-10-CM | POA: Diagnosis not present

## 2019-10-22 DIAGNOSIS — M79671 Pain in right foot: Secondary | ICD-10-CM | POA: Diagnosis not present

## 2019-10-22 DIAGNOSIS — M19072 Primary osteoarthritis, left ankle and foot: Secondary | ICD-10-CM | POA: Diagnosis not present

## 2019-10-23 DIAGNOSIS — M064 Inflammatory polyarthropathy: Secondary | ICD-10-CM | POA: Diagnosis not present

## 2019-11-20 DIAGNOSIS — M79643 Pain in unspecified hand: Secondary | ICD-10-CM | POA: Diagnosis not present

## 2019-11-20 DIAGNOSIS — Z79899 Other long term (current) drug therapy: Secondary | ICD-10-CM | POA: Diagnosis not present

## 2019-11-20 DIAGNOSIS — M1990 Unspecified osteoarthritis, unspecified site: Secondary | ICD-10-CM | POA: Diagnosis not present

## 2019-11-20 DIAGNOSIS — M064 Inflammatory polyarthropathy: Secondary | ICD-10-CM | POA: Diagnosis not present

## 2019-11-20 DIAGNOSIS — M7989 Other specified soft tissue disorders: Secondary | ICD-10-CM | POA: Diagnosis not present

## 2019-11-20 DIAGNOSIS — Z9225 Personal history of immunosupression therapy: Secondary | ICD-10-CM | POA: Diagnosis not present

## 2019-12-18 DIAGNOSIS — Z79899 Other long term (current) drug therapy: Secondary | ICD-10-CM | POA: Diagnosis not present

## 2019-12-18 DIAGNOSIS — M0609 Rheumatoid arthritis without rheumatoid factor, multiple sites: Secondary | ICD-10-CM | POA: Diagnosis not present

## 2019-12-18 DIAGNOSIS — M1990 Unspecified osteoarthritis, unspecified site: Secondary | ICD-10-CM | POA: Diagnosis not present

## 2019-12-18 DIAGNOSIS — M79643 Pain in unspecified hand: Secondary | ICD-10-CM | POA: Diagnosis not present

## 2020-02-14 ENCOUNTER — Other Ambulatory Visit: Payer: Self-pay | Admitting: Neurology

## 2020-03-19 ENCOUNTER — Telehealth: Payer: Self-pay

## 2020-03-19 NOTE — Telephone Encounter (Signed)
[name] - Client TELEPHONE ADVICE RECORD [facility] Patient Name: Karina [name] Gender: Female DOB: 1955/12/20 Age: 65 Y 60 M 28 D Return Phone Number: [phone] (Primary), [phone] (Secondary) Address[name] [address] Client Karina [name] - Client Client Site [facility] Glori Bickers, Roque Lias - MD Contact Type Call Who Is Calling Patient / Member / Family / Caregiver Call Type Triage / Clinical Relationship To Patient Self Return Phone Number [phone] (Primary) Chief Complaint BREATHING - fast, heavy or wheezing Reason for Call Symptomatic / Request for [name] Translation No Nurse Assessment Nurse: Hassell Done, RN, Joelene Millin Date/Time Eilene Ghazi Time): 03/18/2020 4:31:10 PM Confirm and document reason for call. If symptomatic, describe symptoms. ---caller states she has nasal congestion. no fever. Does the patient have any new or worsening symptoms? ---Yes Will a triage be completed? ---Yes Related visit to physician within the last 2 weeks? ---No Does the PT have any chronic conditions? (i.e. diabetes, asthma, this includes High risk factors for pregnancy, etc.) ---Yes List chronic conditions. ---COPD Is this a behavioral health or substance abuse call? ---No Guidelines Guideline Title Affirmed Question Affirmed Notes Nurse Date/Time Eilene Ghazi Time) Common Cold Common cold with no complications Alanda Amass 03/18/2020 4:32:14 PM Disp. Time Eilene Ghazi Time) Disposition Final User 03/18/2020 4:29:58 PM Send to Urgent Queue Rica Mote 03/18/2020 4:34:47 PM Home Care Yes Hassell Done, RN, Karina [name] Disagree/Comply Comply Caller Understands Yes PreDisposition Call Doctor Care Advice Given Per Guideline PLEASE NOTE: All timestamps contained within this report are represented as  [place] Standard Time. CONFIDENTIALTY NOTICE: This fax transmission is intended only for the addressee. It contains information that is legally privileged, confidential or otherwise protected from use or disclosure. If you are not the intended recipient, you are strictly prohibited from reviewing, disclosing, copying using or disseminating any of this information or taking any action in reliance on or regarding this information. If you have received this fax in error, please notify us immediately by telephone so that we can arrange for its return to [place]. Phone: [phone], Toll-Free: [phone], Fax: [phone] Page: 2 of 2 Call Id: [id number] Care Advice Given Per Guideline HOME CARE: * You should be able to treat this at home. * It sounds like an uncomplicated cold that we can treat at home. REASSURANCE AND EDUCATION - COMMON COLD SYMPTOMS: FOR A RUNNY NOSE - BLOW YOUR NOSE: * Nasal mucus and discharge help wash viruses and bacteria out of the nose and sinuses. * Blowing your nose helps clean out your nose. Use a handkerchief or a paper tissue. * If the skin around your nostrils gets irritated, apply a tiny amount of petroleum ointment to the nasal openings once or twice a [name]. NASAL WASHES FOR A STUFFY NOSE: HUMIDIFIER: * If the air is dry, use a humidifier in the bedroom. * Dry air makes coughs worse. EXPECTED COURSE: * Fever 2 to 3 days * Nasal discharge 7 to 14 days * Cough 2 to 3 weeks. CALL BACK IF: * Fever lasts over 3 days * Runny nose lasts over 10 days * You become short of breath * You become worse CARE ADVICE given per Common Cold (Adult) guideline.

## 2020-03-19 NOTE — Telephone Encounter (Signed)
I spoke with pt; pt is breathing some better today but still has some difficulty getting her breath; pt has prod cough with white phlegm. Nebulizer is helping the SOB. No other covid symptoms;no CP but pt is wheezing. Pt has hx of bronchitis and pneumonia per pt. Pt will go to [facility] for eval and possible CXR. Sending note to DR [employer].

## 2020-03-19 NOTE — Telephone Encounter (Signed)
Thanks, will watch for correspondence

## 2020-03-22 DIAGNOSIS — Z20822 Contact with and (suspected) exposure to covid-19: Secondary | ICD-10-CM | POA: Diagnosis not present

## 2020-03-26 DIAGNOSIS — Z9181 History of falling: Secondary | ICD-10-CM | POA: Diagnosis not present

## 2020-03-26 DIAGNOSIS — Z87891 Personal history of nicotine dependence: Secondary | ICD-10-CM | POA: Diagnosis not present

## 2020-03-26 DIAGNOSIS — Z9109 Other allergy status, other than to drugs and biological substances: Secondary | ICD-10-CM | POA: Diagnosis not present

## 2020-03-26 DIAGNOSIS — G629 Polyneuropathy, unspecified: Secondary | ICD-10-CM | POA: Diagnosis not present

## 2020-03-26 DIAGNOSIS — E669 Obesity, unspecified: Secondary | ICD-10-CM | POA: Diagnosis not present

## 2020-03-26 DIAGNOSIS — Z7722 Contact with and (suspected) exposure to environmental tobacco smoke (acute) (chronic): Secondary | ICD-10-CM | POA: Diagnosis not present

## 2020-03-26 DIAGNOSIS — Z6831 Body mass index (BMI) 31.0-31.9, adult: Secondary | ICD-10-CM | POA: Diagnosis not present

## 2020-03-26 DIAGNOSIS — R32 Unspecified urinary incontinence: Secondary | ICD-10-CM | POA: Diagnosis not present

## 2020-03-26 DIAGNOSIS — G8929 Other chronic pain: Secondary | ICD-10-CM | POA: Diagnosis not present

## 2020-03-26 DIAGNOSIS — J449 Chronic obstructive pulmonary disease, unspecified: Secondary | ICD-10-CM | POA: Diagnosis not present

## 2020-04-14 ENCOUNTER — Encounter: Payer: Self-pay | Admitting: Adult Health

## 2020-04-14 ENCOUNTER — Ambulatory Visit: Payer: Medicare HMO | Admitting: Adult Health

## 2020-04-14 VITALS — BP 108/81 | HR 70 | Ht 61.0 in | Wt 172.0 lb

## 2020-04-14 DIAGNOSIS — G35 Multiple sclerosis: Secondary | ICD-10-CM

## 2020-04-14 DIAGNOSIS — R202 Paresthesia of skin: Secondary | ICD-10-CM | POA: Diagnosis not present

## 2020-04-14 DIAGNOSIS — R200 Anesthesia of skin: Secondary | ICD-10-CM | POA: Diagnosis not present

## 2020-04-14 MED ORDER — GABAPENTIN 300 MG PO CAPS: ORAL_CAPSULE | ORAL | 3 refills | Status: DC

## 2020-04-14 NOTE — Progress Notes (Signed)
PATIENT: Karina [name] DOB: 06-06-1955  REASON FOR VISIT: follow up HISTORY FROM: patient  HISTORY OF PRESENT ILLNESS: Today 04/14/20:  Karina [name] is a 65 year old female with a history of multiple sclerosis.  She returns today for follow-up.  She states that she was unable to get the MRI of the last visit.  She states that she did not like the closed an MRI and with her COPD she had a hard time breathing.  She reports that she quit smoking 1 month ago.  Since then she has noticed changes in her mood.  Reports that she is more agitated.  Reports that she has called her primary care but is having trouble getting an appointment.  She states that she is having more burning and tingling in the feet.  She states that the bottom of her feet feel like they are on fire.  Worse at rest time.  She also reports a mild tremor in the upper extremities.  She likes to paint and has noticed some difficulty with painting and putting her make-up on.  Denies any significant changes with her gait or balance.  Reports that sometimes she may stumble into a wall but no falls to the ground.  She states that her memory is worse plans to make an appointment with ophthalmology.  She was on Betaseron in the past but has not been on any disease modifying therapy in quite some time.  10/09/19:Karina [name] is a 65 year old female with a history of multiple sclerosis.  She returns today for follow-up.  She states that she has been having new symptoms of pain in the back of the head.  She states initially it started on the left side but now it is more in the center.  She has seen her PCP who has ordered a soft tissue CT of the neck and an ultrasound of the head and neck that was relatively unremarkable.  She reports that she continues to have discomfort.  She also reports that the numbness and tingling in the feet is much worse.  She states that gabapentin is no longer beneficial.  She is even tried increasing her dose of gabapentin  without benefit.  She also reports that she has been having joint pain primarily in the right hand.  She is not on any disease modifying therapy.  She returns today for an evaluation.  HISTORY 01/31/19:  Karina [name] is a 65 year old female with a history of multiple sclerosis.  She returns today for follow-up.  She is currently not on any disease modifying therapy.  Overall she states that she has done well.  She denies any new numbness or tingling.  Continues to have numbness and tingling in the toes and feet.  Feels that it may have progressed some over time but no new symptoms.  Denies any changes with her gait or balance other than at times she feels dizzy.  She states that sometimes it feels like vertigo but most the time is just an off-balance feeling.  No changes with the bowels or bladder.  No changes with her vision.  Gabapentin remains beneficial for her legs.  She does mention that she has lost a significant amount of weight in the last 3 months.  She has not seen her PCP.  She returns today for an evaluation.   REVIEW OF SYSTEMS: Out of a complete 14 system review of symptoms, the patient complains only of the following symptoms, and all other reviewed systems are negative.  See HPI   ALLERGIES: Allergies  Allergen Reactions  . Percocet [Oxycodone-Acetaminophen] Shortness Of Breath    Felt like going to pass out and sweating  . Tecfidera [Dimethyl Fumarate] Shortness Of Breath, Palpitations, Rash and Cough  . Amitriptyline Hcl     REACTION: swelling and itching  . Atorvastatin     REACTION: elevated LFT's  . Betaseron [Interferon Beta-1b]     Headache  . Clarithromycin     REACTION: reaction not known  . Duloxetine     REACTION: pain  . Levofloxacin     REACTION: Rash  . Pregabalin     REACTION: LE swelling  . Pseudoephedrine     REACTION: legs hurt  . Zoloft [Sertraline Hcl] Other (See Comments)    Headaches     HOME MEDICATIONS: Outpatient Medications Prior to  Visit  Medication Sig Dispense Refill  . albuterol (VENTOLIN HFA) 108 (90 Base) MCG/ACT inhaler INHALE 2 PUFFS UP TO EVERY 4 HOURS AS NEEDED FOR WHEEZING 3 Inhaler 3  . ALPRAZolam (XANAX) 0.5 MG tablet Take 1 tablet (0.5 mg total) by mouth 2 (two) times daily as needed. For anxiety. 30 tablet 0  . azelastine (ASTELIN) 0.1 % nasal spray Place 2 sprays into both nostrils 2 (two) times daily as needed.  12  . Blood Glucose Monitoring Suppl (ACCU-CHEK AVIVA PLUS) w/Device KIT Check blood sugar once daily and as directed Dx R73.09 1 kit 0  . budesonide-formoterol (SYMBICORT) 160-4.5 MCG/ACT inhaler Inhale 2 puffs into the lungs 2 (two) times daily. (Patient taking differently: Inhale 2 puffs into the lungs daily as needed.) 3 Inhaler 2  . Diclofenac Sodium 3 % GEL Place 1 application onto the skin every 12 (twelve) hours as needed. 50 g 0  . gabapentin (NEURONTIN) 300 MG capsule TAKE 1 CAPSULE BY MOUTH TWICE DURING THE DAY AND 2 CAPSULES BY MOUTH AT NIGHT 360 capsule 4  . ipratropium-albuterol (DUONEB) 0.5-2.5 (3) MG/3ML SOLN TAKE 3 MLS BY NEBULIZATION EVERY 6 (SIX) HOURS AS NEEDED. 360 mL 0  . pantoprazole (PROTONIX) 40 MG tablet Take 40 mg by mouth 2 (two) times daily.     No facility-administered medications prior to visit.    PAST MEDICAL HISTORY: Past Medical History:  Diagnosis Date  . Allergic rhinitis   . Allergy   . Anxiety    [place] [place], no per pt  . Cataract   . Colon polyps 08/2008   Adenomatous  . COPD (chronic obstructive pulmonary disease) ([place])   . Depression    no per pt  . Diabetes mellitus   . Gastritis 08/2008   H. pylori  . GERD (gastroesophageal reflux disease)   . History of shingles    Left V1  . Hypercholesteremia   . Lung nodule   . Menopausal disorder   . Multiple sclerosis ([place])   . Neuromuscular disorder ([place])    Multiple sclerosis  . Plantar fasciitis   . Seizure disorder ([place])    single seizure/hypoglycemia  . Seizures ([place])    1  seizure in 2001 due to blood sugar dropping to 38, none since  . Skin lesion 04/05/2017  . Urinary incontinence   . Vertigo     PAST SURGICAL HISTORY: Past Surgical History:  Procedure Laterality Date  . CERVICAL DISCECTOMY  2004   x 2, Anterior; Fusion C4-5, C5-6, C6-7, Dr. Kary Kos  . CHEST CT  11/2008   With small 4 mm nodule L lung base (rec re check in 1 year)  .  CHEST CT  07/2009   Re check chest CT - lung nodule stable  . CHOLECYSTECTOMY    . COLONOSCOPY  08/2008   Polyps/ re check 5 yrs  . CT SINUS LTD W/O CM  02/2009   negative  . DILATION AND CURETTAGE OF UTERUS    . ESOPHAGOGASTRODUODENOSCOPY  08/2008   Erosive gastritis, h pylori (treated)  . FOOT SURGERY     left plantar fascial problem  . ROTATOR CUFF REPAIR     right  . TONSILLECTOMY    . TUBAL LIGATION    . UPPER GASTROINTESTINAL ENDOSCOPY      FAMILY HISTORY: Family History  Problem Relation Age of Onset  . Migraines Mother   . Heart attack Mother   . Coronary artery disease Mother   . Coronary artery disease Father        6 bypasses   . Stroke Father   . Diabetes Father   . Renal Disease Father   . Colon cancer Maternal Grandfather 25  . Coronary artery disease Cousin   . Coronary artery disease Paternal Grandmother   . Lung cancer Paternal Aunt   . Multiple sclerosis Neg Hx   . Esophageal cancer Neg Hx   . Rectal cancer Neg Hx   . Stomach cancer Neg Hx   . Breast cancer Neg Hx     SOCIAL HISTORY: Social History   Socioeconomic History  . Marital status: Married    Spouse name: Not on file  . Number of children: 3  . Years of education: 9 th  . Highest education level: Not on file  Occupational History  . Occupation: Disabled     Employer: DISABLED  Tobacco Use  . Smoking status: Former Smoker    Packs/day: 0.50    Years: 30.00    Pack years: 15.00    Types: Cigarettes    Quit date: 06/17/2019    Years since quitting: 0.8  . Smokeless tobacco: Never Used  Substance and Sexual  Activity  . Alcohol use: No    Alcohol/week: 0.0 standard drinks  . Drug use: No  . Sexual activity: Not on file  Other Topics Concern  . Not on file  Social History Narrative   Married with 3 children   Daily caffeine use - 2    Disabled.   Education 9th grade    Caffeine one cup of coffee daily.   Patient is right handed.    Social Determinants of Health   Financial Resource Strain: Low Risk   . Difficulty of Paying Living Expenses: Not hard at all  Food Insecurity: No Food Insecurity  . Worried About Charity fundraiser in the Last Year: Never true  . Ran Out of Food in the Last Year: Never true  Transportation Needs: No Transportation Needs  . Lack of Transportation (Medical): No  . Lack of Transportation (Non-Medical): No  Physical Activity: Inactive  . Days of Exercise per Week: 0 days  . Minutes of Exercise per Session: 0 min  Stress: No Stress Concern Present  . Feeling of Stress : Not at all  Social Connections: Not on file  Intimate Partner Violence: Not At Risk  . Fear of Current or Ex-Partner: No  . Emotionally Abused: No  . Physically Abused: No  . Sexually Abused: No      PHYSICAL EXAM  Vitals:   04/14/20 1322  BP: 108/81  Pulse: 70  Weight: 172 lb (78 kg)  Height: $Remove'5\' 1"'wyMqYDT$  (1.549 m)  Body mass index is 32.5 kg/m.  Generalized: Well developed, in no acute distress   Neurological examination  Mentation: Alert oriented to time, place, history taking. Follows all commands speech and language fluent Cranial nerve II-XII: Pupils were equal round reactive to light. Extraocular movements were full, visual field were full on confrontational test. Head turning and shoulder shrug  were normal and symmetric. Motor: The motor testing reveals 5 over 5 strength of all 4 extremities. Good symmetric motor tone is noted throughout.  Sensory: Sensory testing is intact to soft touch on all 4 extremities. No evidence of extinction is noted.  Coordination: Cerebellar  testing reveals good finger-nose-finger and heel-to-shin bilaterally.  Mild tremor in the upper extremities Gait and station: Gait is normal.  Reflexes: Deep tendon reflexes are symmetric and normal bilaterally.   DIAGNOSTIC DATA (LABS, IMAGING, TESTING) - I reviewed patient records, labs, notes, testing and imaging myself where available.  Lab Results  Component Value Date   WBC 8.0 08/22/2019   HGB 14.9 08/22/2019   HCT 44.5 08/22/2019   MCV 95.3 08/22/2019   PLT 213 08/22/2019      Component Value Date/Time   NA 138 08/22/2019 1602   NA 141 11/21/2014 1137   NA 136 07/09/2013 1803   K 4.6 08/22/2019 1602   K 4.2 07/09/2013 1803   CL 101 08/22/2019 1602   CL 105 07/09/2013 1803   CO2 28 08/22/2019 1602   CO2 22 07/09/2013 1803   GLUCOSE 78 08/22/2019 1602   GLUCOSE 96 07/09/2013 1803   BUN 12 08/22/2019 1602   BUN 11 11/21/2014 1137   BUN 16 07/09/2013 1803   CREATININE 0.95 08/22/2019 1602   CREATININE 1.20 (H) 04/20/2019 1444   CALCIUM 9.5 08/22/2019 1602   CALCIUM 9.6 07/09/2013 1803   PROT 7.4 04/20/2019 1444   PROT 6.9 11/21/2014 1137   ALBUMIN 4.4 08/11/2017 1644   ALBUMIN 4.3 11/21/2014 1137   AST 18 04/20/2019 1444   ALT 13 04/20/2019 1444   ALKPHOS 70 08/11/2017 1644   BILITOT 0.3 04/20/2019 1444   BILITOT 0.3 11/21/2014 1137   GFRNONAA >60 08/22/2019 1602   GFRNONAA 55 (L) 07/09/2013 1803   GFRAA >60 08/22/2019 1602   GFRAA >60 07/09/2013 1803   Lab Results  Component Value Date   CHOL 266 (H) 09/01/2016   HDL 62.20 09/01/2016   LDLCALC 178 (H) 09/01/2016   LDLDIRECT 206.5 01/24/2013   TRIG 130.0 09/01/2016   CHOLHDL 4 09/01/2016   Lab Results  Component Value Date   HGBA1C 5.4 04/20/2019   Lab Results  Component Value Date   VITAMINB12 376 04/20/2019   Lab Results  Component Value Date   TSH 1.66 04/20/2019      ASSESSMENT AND PLAN 65 y.o. year old female  has a past medical history of Allergic rhinitis, Allergy, Anxiety,  Cataract, Colon polyps (08/2008), COPD (chronic obstructive pulmonary disease) ([place]), Depression, Diabetes mellitus, Gastritis (08/2008), GERD (gastroesophageal reflux disease), History of shingles, Hypercholesteremia, Lung nodule, Menopausal disorder, Multiple sclerosis ([place]), Neuromuscular disorder ([place]), Plantar fasciitis, Seizure disorder ([place]), Seizures ([place]), Skin lesion (04/05/2017), Urinary incontinence, and Vertigo. here with:  Multiple sclerosis   MRI of the brain with and without contrast-we will request an open MRI.  Also advised that she could take Xanax 1 tablet 30 minutes before her scan.  Numbness and tingling in the lower extremities   Increase gabapentin to 300 mg twice a day and 900 mg at bedtime   Mood changes r/t  smoking sensation   Follow-up with PCP   I spent 30 minutes of face-to-face and non-face-to-face time with patient.  This included previsit chart review, lab review, study review, order entry, electronic health record documentation, patient education.  Ward Givens, MSN, NP-C 04/14/2020, 1:30 PM [facility] [address], [place], [place] [address] [phone]

## 2020-04-14 NOTE — Patient Instructions (Signed)
Your Plan:  Increase gabapentin to 1 tablet twice a day during the day and 3 tablets at bedtime MRI brain- ok to take 1 tablet xanax 30 minutes before scan If your symptoms worsen or you develop new symptoms please let us know.   Thank you for coming to see [place] at [facility]. I hope we have been able to provide you high quality care today.  You may receive a patient satisfaction survey over the next few weeks. We would appreciate your feedback and comments so that we may continue to improve ourselves and the health of our patients.

## 2020-04-14 NOTE — Progress Notes (Signed)
I have read the note, and I agree with the clinical assessment and plan.  Any Mcneice K Lemonte Al   

## 2020-04-15 ENCOUNTER — Telehealth: Payer: Self-pay | Admitting: Adult Health

## 2020-04-15 NOTE — Telephone Encounter (Signed)
Open MRI  Bernadene Person auth: [id number] (exp. 04/15/20 to 10/12/20) order faxed to [facility] they will reach out to the patient to schedule.

## 2020-04-15 NOTE — Telephone Encounter (Signed)
scheduled for 2/21 1145am

## 2020-04-28 DIAGNOSIS — G35 Multiple sclerosis: Secondary | ICD-10-CM | POA: Diagnosis not present

## 2020-05-01 ENCOUNTER — Encounter: Payer: Self-pay | Admitting: Adult Health

## 2020-05-08 ENCOUNTER — Telehealth: Payer: Self-pay | Admitting: *Deleted

## 2020-05-08 NOTE — Telephone Encounter (Signed)
Called patient and informed her Dr Jannifer Franklin  reviewed the scans, and it does appear that there may be a few new punctate lesions in both hemispheres, not a severe change but there is probably some small amount of progression as compared to prior study done in 2019.  It certainly would be better if she could be on a disease modifying agent in the future. Per NP she needs ot be seen in 1-2 weeks. We scheduled FU for next Wed., I advised she arrive 30 minutes early to check in, bring new insurance cards. Patient verbalized understanding, appreciation.

## 2020-05-08 NOTE — Telephone Encounter (Signed)
Please call patient advise that Dr. Jannifer Franklin was able to review and compare her scans.  There does seem to be some new lesions although not a severe change.  We do recommend that she go back on a disease modifying agent.  There are other options other than Betaseron.  Please schedule her for a follow-up in the office  in the next 1 to 2 weeks to discuss medication options

## 2020-05-08 NOTE — Telephone Encounter (Signed)
-----   Message from Kathrynn Ducking, MD sent at 05/07/2020  6:27 PM EST ----- I reviewed the scans, it does appear that there may be a few new punctate lesions in both hemispheres, not a severe change but there is probably some small amount of progression as compared to prior study done in 2019.  It certainly would be better if she could be on a disease modifying agent in the future.

## 2020-05-14 ENCOUNTER — Encounter: Payer: Self-pay | Admitting: Adult Health

## 2020-05-14 ENCOUNTER — Other Ambulatory Visit: Payer: Self-pay

## 2020-05-14 ENCOUNTER — Ambulatory Visit: Payer: Medicare HMO | Admitting: Adult Health

## 2020-05-14 VITALS — BP 128/78 | HR 78 | Ht 61.0 in | Wt 180.0 lb

## 2020-05-14 DIAGNOSIS — G35 Multiple sclerosis: Secondary | ICD-10-CM

## 2020-05-14 NOTE — Patient Instructions (Signed)
Your Plan:  Look over medication and mychart message when you decide which one If your symptoms worsen or you develop new symptoms please let us know.    Thank you for coming to see [place] at [facility]. I hope we have been able to provide you high quality care today.  You may receive a patient satisfaction survey over the next few weeks. We would appreciate your feedback and comments so that we may continue to improve ourselves and the health of our patients.

## 2020-05-14 NOTE — Progress Notes (Signed)
PATIENT: Karina [name] DOB: 1955-12-29  REASON FOR VISIT: follow up HISTORY FROM: patient  HISTORY OF PRESENT ILLNESS: Today 05/14/20:  Karina [name] is a 65 year old female with a history of multiple sclerosis.  She returns today to discuss disease modifying therapy.  Her recent MRI did show some progression.  In the past the patient has been on Betaseron and Tecfidera.  She states that Tecfidera made her feel sick and she cannot tolerate it.  She is open to trying a new medication.  She returns today to discuss options.  Karina [name] is a 65 year old female with a history of multiple sclerosis.  She returns today for follow-up.  She states that she was unable to get the MRI of the last visit.  She states that she did not like the closed an MRI and with her COPD she had a hard time breathing.  She reports that she quit smoking 1 month ago.  Since then she has noticed changes in her mood.  Reports that she is more agitated.  Reports that she has called her primary care but is having trouble getting an appointment.  She states that she is having more burning and tingling in the feet.  She states that the bottom of her feet feel like they are on fire.  Worse at rest time.  She also reports a mild tremor in the upper extremities.  She likes to paint and has noticed some difficulty with painting and putting her make-up on.  Denies any significant changes with her gait or balance.  Reports that sometimes she may stumble into a wall but no falls to the ground.  She states that her memory is worse plans to make an appointment with ophthalmology.  She was on Betaseron in the past but has not been on any disease modifying therapy in quite some time.  10/09/19:Karina [name] is a 65 year old female with a history of multiple sclerosis.  She returns today for follow-up.  She states that she has been having new symptoms of pain in the back of the head.  She states initially it started on the left side but now it is  more in the center.  She has seen her PCP who has ordered a soft tissue CT of the neck and an ultrasound of the head and neck that was relatively unremarkable.  She reports that she continues to have discomfort.  She also reports that the numbness and tingling in the feet is much worse.  She states that gabapentin is no longer beneficial.  She is even tried increasing her dose of gabapentin without benefit.  She also reports that she has been having joint pain primarily in the right hand.  She is not on any disease modifying therapy.  She returns today for an evaluation.  HISTORY 01/31/19:  Karina [name] is a 65 year old female with a history of multiple sclerosis.  She returns today for follow-up.  She is currently not on any disease modifying therapy.  Overall she states that she has done well.  She denies any new numbness or tingling.  Continues to have numbness and tingling in the toes and feet.  Feels that it may have progressed some over time but no new symptoms.  Denies any changes with her gait or balance other than at times she feels dizzy.  She states that sometimes it feels like vertigo but most the time is just an off-balance feeling.  No changes with the bowels or bladder.  No changes  with her vision.  Gabapentin remains beneficial for her legs.  She does mention that she has lost a significant amount of weight in the last 3 months.  She has not seen her PCP.  She returns today for an evaluation.   REVIEW OF SYSTEMS: Out of a complete 14 system review of symptoms, the patient complains only of the following symptoms, and all other reviewed systems are negative.  See HPI   ALLERGIES: Allergies  Allergen Reactions  . Percocet [Oxycodone-Acetaminophen] Shortness Of Breath    Felt like going to pass out and sweating  . Tecfidera [Dimethyl Fumarate] Shortness Of Breath, Palpitations, Rash and Cough  . Amitriptyline Hcl     REACTION: swelling and itching  . Atorvastatin     REACTION:  elevated LFT's  . Betaseron [Interferon Beta-1b]     Headache  . Clarithromycin     REACTION: reaction not known  . Duloxetine     REACTION: pain  . Levofloxacin     REACTION: Rash  . Pregabalin     REACTION: LE swelling  . Pseudoephedrine     REACTION: legs hurt  . Zoloft [Sertraline Hcl] Other (See Comments)    Headaches     HOME MEDICATIONS: Outpatient Medications Prior to Visit  Medication Sig Dispense Refill  . albuterol (VENTOLIN HFA) 108 (90 Base) MCG/ACT inhaler INHALE 2 PUFFS UP TO EVERY 4 HOURS AS NEEDED FOR WHEEZING 3 Inhaler 3  . ALPRAZolam (XANAX) 0.5 MG tablet Take 1 tablet (0.5 mg total) by mouth 2 (two) times daily as needed. For anxiety. 30 tablet 0  . azelastine (ASTELIN) 0.1 % nasal spray Place 2 sprays into both nostrils 2 (two) times daily as needed.  12  . Blood Glucose Monitoring Suppl (ACCU-CHEK AVIVA PLUS) w/Device KIT Check blood sugar once daily and as directed Dx R73.09 1 kit 0  . budesonide-formoterol (SYMBICORT) 160-4.5 MCG/ACT inhaler Inhale 2 puffs into the lungs 2 (two) times daily. (Patient taking differently: Inhale 2 puffs into the lungs daily as needed.) 3 Inhaler 2  . Diclofenac Sodium 3 % GEL Place 1 application onto the skin every 12 (twelve) hours as needed. 50 g 0  . gabapentin (NEURONTIN) 300 MG capsule TAKE 1 CAPSULE BY MOUTH TWICE DURING THE DAY AND 3 CAPSULES BY MOUTH AT NIGHT 450 capsule 3  . ipratropium-albuterol (DUONEB) 0.5-2.5 (3) MG/3ML SOLN TAKE 3 MLS BY NEBULIZATION EVERY 6 (SIX) HOURS AS NEEDED. 360 mL 0  . pantoprazole (PROTONIX) 40 MG tablet Take 40 mg by mouth 2 (two) times daily.     No facility-administered medications prior to visit.    PAST MEDICAL HISTORY: Past Medical History:  Diagnosis Date  . Allergic rhinitis   . Allergy   . Anxiety    [place] [place], no per pt  . Cataract   . [name] polyps 08/2008   Adenomatous  . COPD (chronic obstructive pulmonary disease) ([place])   . Depression    no per pt  .  Diabetes mellitus   . Gastritis 08/2008   H. pylori  . GERD (gastroesophageal reflux disease)   . History of shingles    Left V1  . Hypercholesteremia   . Lung nodule   . Menopausal disorder   . Multiple sclerosis ([place])   . Neuromuscular disorder ([place])    Multiple sclerosis  . Plantar fasciitis   . Seizure disorder ([place])    single seizure/hypoglycemia  . Seizures ([place])    1 seizure in 2001 due to blood sugar  dropping to 38, none since  . Skin lesion 04/05/2017  . Urinary incontinence   . Vertigo     PAST SURGICAL HISTORY: Past Surgical History:  Procedure Laterality Date  . CERVICAL DISCECTOMY  2004   x 2, Anterior; Fusion C4-5, C5-6, C6-7, Dr. Kary Kos  . CHEST CT  11/2008   With small 4 mm nodule L lung base (rec re check in 1 year)  . CHEST CT  07/2009   Re check chest CT - lung nodule stable  . CHOLECYSTECTOMY    . COLONOSCOPY  08/2008   Polyps/ re check 5 yrs  . CT SINUS LTD W/O CM  02/2009   negative  . DILATION AND CURETTAGE OF UTERUS    . ESOPHAGOGASTRODUODENOSCOPY  08/2008   Erosive gastritis, h pylori (treated)  . FOOT SURGERY     left plantar fascial problem  . ROTATOR CUFF REPAIR     right  . TONSILLECTOMY    . TUBAL LIGATION    . UPPER GASTROINTESTINAL ENDOSCOPY      FAMILY HISTORY: Family History  Problem Relation Age of Onset  . Migraines Mother   . Heart attack Mother   . Coronary artery disease Mother   . Coronary artery disease Father        6 bypasses   . Stroke Father   . Diabetes Father   . Renal Disease Father   . [name] cancer Maternal Grandfather 90  . Coronary artery disease Cousin   . Coronary artery disease Paternal Grandmother   . Lung cancer Paternal Aunt   . Multiple sclerosis Neg Hx   . Esophageal cancer Neg Hx   . Rectal cancer Neg Hx   . Stomach cancer Neg Hx   . Breast cancer Neg Hx     SOCIAL HISTORY: Social History   Socioeconomic History  . Marital status: Married    Spouse name: Not on file  . Number of  children: 3  . Years of education: 9 th  . Highest education level: Not on file  Occupational History  . Occupation: Disabled     Employer: DISABLED  Tobacco Use  . Smoking status: Former Smoker    Packs/day: 0.50    Years: 30.00    Pack years: 15.00    Types: Cigarettes    Quit date: 06/17/2019    Years since quitting: 0.9  . Smokeless tobacco: Never Used  Substance and Sexual Activity  . Alcohol use: No    Alcohol/week: 0.0 standard drinks  . Drug use: No  . Sexual activity: Not on file  Other Topics Concern  . Not on file  Social History Narrative   Married with 3 children   Daily caffeine use - 2    Disabled.   Education 9th grade    Caffeine one cup of coffee daily.   Patient is right handed.    Social Determinants of Health   Financial Resource Strain: Low Risk   . Difficulty of Paying Living Expenses: Not hard at all  Food Insecurity: No Food Insecurity  . Worried About Charity fundraiser in the Last Year: Never true  . Ran Out of Food in the Last Year: Never true  Transportation Needs: No Transportation Needs  . Lack of Transportation (Medical): No  . Lack of Transportation (Non-Medical): No  Physical Activity: Inactive  . Days of Exercise per Week: 0 days  . Minutes of Exercise per Session: 0 min  Stress: No Stress Concern Present  .  Feeling of Stress : Not at all  Social Connections: Not on file  Intimate Partner Violence: Not At Risk  . Fear of Current or Ex-Partner: No  . Emotionally Abused: No  . Physically Abused: No  . Sexually Abused: No      PHYSICAL EXAM  Vitals:   05/14/20 0816  BP: 128/78  Pulse: 78  Weight: 180 lb (81.6 kg)  Height: 5' 1" (1.549 m)   Body mass index is 34.01 kg/m.  Generalized: Well developed, in no acute distress   Neurological examination  Mentation: Alert oriented to time, place, history taking. Follows all commands speech and language fluent Cranial nerve II-XII: Pupils were equal round reactive to light.  Extraocular movements were full, visual field were full on confrontational test. Head turning and shoulder shrug  were normal and symmetric. Motor: The motor testing reveals 5 over 5 strength of all 4 extremities. Good symmetric motor tone is noted throughout.  Sensory: Sensory testing is intact to soft touch on all 4 extremities. No evidence of extinction is noted.  Coordination: Cerebellar testing reveals good finger-nose-finger and heel-to-shin bilaterally.  Mild tremor in the upper extremities Gait and station: Gait is normal.  Reflexes: Deep tendon reflexes are symmetric and normal bilaterally.   DIAGNOSTIC DATA (LABS, IMAGING, TESTING) - I reviewed patient records, labs, notes, testing and imaging myself where available.  Lab Results  Component Value Date   WBC 8.0 08/22/2019   HGB 14.9 08/22/2019   HCT 44.5 08/22/2019   MCV 95.3 08/22/2019   PLT 213 08/22/2019      Component Value Date/Time   NA 138 08/22/2019 1602   NA 141 11/21/2014 1137   NA 136 07/09/2013 1803   K 4.6 08/22/2019 1602   K 4.2 07/09/2013 1803   CL 101 08/22/2019 1602   CL 105 07/09/2013 1803   CO2 28 08/22/2019 1602   CO2 22 07/09/2013 1803   GLUCOSE 78 08/22/2019 1602   GLUCOSE 96 07/09/2013 1803   BUN 12 08/22/2019 1602   BUN 11 11/21/2014 1137   BUN 16 07/09/2013 1803   CREATININE 0.95 08/22/2019 1602   CREATININE 1.20 (H) 04/20/2019 1444   CALCIUM 9.5 08/22/2019 1602   CALCIUM 9.6 07/09/2013 1803   PROT 7.4 04/20/2019 1444   PROT 6.9 11/21/2014 1137   ALBUMIN 4.4 08/11/2017 1644   ALBUMIN 4.3 11/21/2014 1137   AST 18 04/20/2019 1444   ALT 13 04/20/2019 1444   ALKPHOS 70 08/11/2017 1644   BILITOT 0.3 04/20/2019 1444   BILITOT 0.3 11/21/2014 1137   GFRNONAA >60 08/22/2019 1602   GFRNONAA 55 (L) 07/09/2013 1803   GFRAA >60 08/22/2019 1602   GFRAA >60 07/09/2013 1803   Lab Results  Component Value Date   CHOL 266 (H) 09/01/2016   HDL 62.20 09/01/2016   LDLCALC 178 (H) 09/01/2016    LDLDIRECT 206.5 01/24/2013   TRIG 130.0 09/01/2016   CHOLHDL 4 09/01/2016   Lab Results  Component Value Date   HGBA1C 5.4 04/20/2019   Lab Results  Component Value Date   VITAMINB12 376 04/20/2019   Lab Results  Component Value Date   TSH 1.66 04/20/2019      ASSESSMENT AND PLAN 64 y.o. year old female  has a past medical history of Allergic rhinitis, Allergy, Anxiety, Cataract, [name] polyps (08/2008), COPD (chronic obstructive pulmonary disease) ([place]), Depression, Diabetes mellitus, Gastritis (08/2008), GERD (gastroesophageal reflux disease), History of shingles, Hypercholesteremia, Lung nodule, Menopausal disorder, Multiple sclerosis ([place]), Neuromuscular disorder ([place]), Plantar  fasciitis, Seizure disorder ([place]), Seizures ([place]), Skin lesion (04/05/2017), Urinary incontinence, and Vertigo. here with:  Multiple sclerosis   MRI of the brain did show progression  Today we discussed starting Zeposia or Gilenya.  The patient was provided with handouts regarding both medications.  She plans to go over the medications and send [place] a message to let us know which one she would like to proceed with.  She went ahead and signed both start forms.   Follow-up in 6 months or sooner if needed   I spent 30 minutes of face-to-face and non-face-to-face time with patient.  This included previsit chart review, lab review, study review, order entry, electronic health record documentation, patient education.  Ward Givens, MSN, NP-C 05/14/2020, 8:22 AM [facility] [address] [place], [place] [address] [phone]

## 2020-05-22 DIAGNOSIS — H524 Presbyopia: Secondary | ICD-10-CM | POA: Diagnosis not present

## 2020-05-22 DIAGNOSIS — H25813 Combined forms of age-related cataract, bilateral: Secondary | ICD-10-CM | POA: Diagnosis not present

## 2020-05-22 DIAGNOSIS — H52203 Unspecified astigmatism, bilateral: Secondary | ICD-10-CM | POA: Diagnosis not present

## 2020-06-18 DIAGNOSIS — H2512 Age-related nuclear cataract, left eye: Secondary | ICD-10-CM | POA: Diagnosis not present

## 2020-06-18 DIAGNOSIS — H25012 Cortical age-related cataract, left eye: Secondary | ICD-10-CM | POA: Diagnosis not present

## 2020-06-18 DIAGNOSIS — H25011 Cortical age-related cataract, right eye: Secondary | ICD-10-CM | POA: Diagnosis not present

## 2020-06-18 DIAGNOSIS — H2511 Age-related nuclear cataract, right eye: Secondary | ICD-10-CM | POA: Diagnosis not present

## 2020-06-25 DIAGNOSIS — H2512 Age-related nuclear cataract, left eye: Secondary | ICD-10-CM | POA: Diagnosis not present

## 2020-07-23 DIAGNOSIS — Z0100 Encounter for examination of eyes and vision without abnormal findings: Secondary | ICD-10-CM | POA: Diagnosis not present

## 2020-07-23 DIAGNOSIS — H524 Presbyopia: Secondary | ICD-10-CM | POA: Diagnosis not present

## 2020-08-11 ENCOUNTER — Other Ambulatory Visit: Payer: Self-pay

## 2020-08-11 ENCOUNTER — Encounter: Payer: Self-pay | Admitting: Family Medicine

## 2020-08-11 ENCOUNTER — Ambulatory Visit (INDEPENDENT_AMBULATORY_CARE_PROVIDER_SITE_OTHER): Payer: Medicare HMO | Admitting: Family Medicine

## 2020-08-11 VITALS — BP 126/80 | HR 75 | Temp 97.1°F | Ht 61.0 in | Wt 192.3 lb

## 2020-08-11 DIAGNOSIS — G8929 Other chronic pain: Secondary | ICD-10-CM

## 2020-08-11 DIAGNOSIS — G609 Hereditary and idiopathic neuropathy, unspecified: Secondary | ICD-10-CM | POA: Diagnosis not present

## 2020-08-11 DIAGNOSIS — G35 Multiple sclerosis: Secondary | ICD-10-CM

## 2020-08-11 DIAGNOSIS — R519 Headache, unspecified: Secondary | ICD-10-CM | POA: Diagnosis not present

## 2020-08-11 MED ORDER — METAXALONE 800 MG PO TABS: 800.0000 mg | ORAL_TABLET | Freq: Three times a day (TID) | ORAL | 1 refills | Status: DC | PRN

## 2020-08-11 NOTE — Assessment & Plan Note (Addendum)
Has seen myself, neurology and ENT (Dr Redmond Baseman) and dermatology Some tinnitus  Some neck pain /spasm and more pain in forehead now as well as L posterior head Some scabs that she picks on scalp  Has tried gabapentin, prednisone,lyrica, topicals and seb derm tx with no help  No change in exam Will try a muscle relaxer -skelaxin with warning of sedation potential  Plan to message her neurologist as well for more ideas  ? If possible occipital neuralgia, but expect this is multi factorial  33 minutes were spent today both face to face and in the chart obtaining history, reviewing records (neurology, ENT, imaging), performing exam , educating and discussing treatment options

## 2020-08-11 NOTE — Patient Instructions (Addendum)
I will send a note to your neurologist re: the head pain   I know you have tried gabapentin and lyrica and prednisone  You have seen ENT and dermatology  Have had MRI and other imaging   Let's try a muscle relaxer (skelaxin)  Watch for sedation (start with it at night) and can go up to three times daily  1/2 to 1 pill   Let me know if this helps pain  If it does not -stop it   Stay well hydrated  Don't add more caffeine

## 2020-08-11 NOTE — Assessment & Plan Note (Signed)
Seeing neuro  Did not tolerate last med tried  MRI showed progression  ? If related to head pain

## 2020-08-11 NOTE — Assessment & Plan Note (Signed)
Gabapentin 300 bid and 900 at bedtime Sees neuro

## 2020-08-11 NOTE — Progress Notes (Signed)
Subjective:    Patient ID: Karina [name], female    DOB: 1955-04-12, 65 y.o.   MRN: [id number]  This visit occurred during the SARS-CoV-2 public health emergency.  Safety protocols were in place, including screening questions prior to the visit, additional usage of staff PPE, and extensive cleaning of exam room while observing appropriate contact time as indicated for disinfecting solutions.    HPI Pt presents for c/o head pain   Wt Readings from Last 3 Encounters:  08/11/20 192 lb 5 oz (87.2 kg)  05/14/20 180 lb (81.6 kg)  04/14/20 172 lb (78 kg)   36.34 kg/m  H/o chronic pain L posterior head  Now higher up - mid scalp  Feels like hair is being pulled but deeper  It burns when she touches it as well  Feels almost like shingles (has not had it)  Noticed bumps- that are tender (they do not drain) -she sometimes digs them out and leaves a sore/scab  Gabapentin does not help  Takes 4 advil and thatn helps some    Wakes up with night sweats  ? If from [place]  No meds currently   Per prior w/u:  US -neck/head and CT soft tissue of neck -relatively unremarkable Has seen ENT Dr Redmond Baseman (R submandibular swelling)  CT soft tissue neck: IMPRESSION: Mild asymmetric enlargement of the right submandibular gland without obstructing stone or tumor. Question episodes of sialoadenitis.  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  [place] of area:  IMPRESSION: Negative ultrasound of the left occipital scalp. No focal abnormality seen to correlate with the palpable abnormality of concern. If there remains clinical concern for a possible underlying occult lesion, further evaluation with dedicated head CT could be performed for further evaluation as warranted.  She has MS and sees neurology (last took Tecfidera and that made her feel sick)  Recent MRI showed progression   MRI 3/19 IMPRESSION:  Abnormal MRI scan of the brain showing scattered  periventricular, subcortical and cerebellar white matter hyperintensities on T2/FLAIR images consistent with demyelinating disease. No enhancing lesions are noted. Presence of T 1 black holes indicates chronic disease. Overall no significant change compared with previous MRI scan dated 12/06/2014  Newer MRI report we do not have   Also neuropathy  Takes gabapentin 300 bid and 900 at bedtime    Lab Results  Component Value Date   VITAMINB12 376 04/20/2019   Lab Results  Component Value Date   ESRSEDRATE 36 (H) 10/12/2019   No results found for: CRP  She takes prednisone for joint pain on /off -does not help her head  lyrica did not help  Saw dermatology -has tried shampoo for seb derm    Patient Active Problem List   Diagnosis Date Noted  . Joint pain in both hands 10/12/2019  . Wrist swelling 10/12/2019  . Productive cough 08/22/2019  . Head pain 08/22/2019  . Elevated serum creatinine 04/24/2019  . Frequent urination 04/20/2019  . Localized swelling, mass and lump, neck 04/20/2019  . Decreased appetite 04/20/2019  . Screening mammogram, encounter for 04/20/2019  . Weight loss 02/12/2019  . Rib pain 05/05/2018  . Seborrheic keratosis, inflamed 12/15/2017  . B12 deficiency 08/24/2017  . Left low back pain 08/24/2017  . Erosive osteoarthritis of both hands 09/09/2016  . Fatigue 09/07/2016  . Bilateral hand pain 09/07/2016  . Routine general medical examination at a health care facility 05/06/2015  . Large breasts 05/06/2015  .  Lumbar disc disease 11/01/2014  . Knee pain, bilateral 03/11/2014  . Thoracic back pain 11/30/2013  . Encounter for routine gynecological examination 01/31/2013  . Encounter for Medicare annual wellness exam 01/23/2013  . Chest pain 09/20/2012  . SOB (shortness of breath) 09/20/2012  . Dyspnea 09/13/2012  . Asthmatic bronchitis , chronic ([place]) 12/06/2011  . Chronic cough 11/22/2011  . Anxiety disorder 07/17/2009  . HEMORRHOIDS-EXTERNAL  11/07/2008  . IRRITABLE BOWEL SYNDROME 11/07/2008  . Lung nodule 10/14/2008  . Prediabetes 10/14/2008  . PERSONAL HX COLONIC POLYPS 10/08/2008  . Vitamin D deficiency 07/23/2008  . SYNCOPE, HX OF 06/01/2008  . FOOT SURGERY, HX OF 06/01/2008  . DILATION AND CURETTAGE, HX OF 06/01/2008  . OSTEOARTHRITIS, SHOULDER, RIGHT 03/22/2008  . ROTATOR CUFF SYNDROME, RIGHT 03/22/2008  . ARTHRALGIA 12/12/2007  . Allergic rhinitis 08/01/2007  . Smoker 10/17/2006  . Peripheral neuropathy 10/17/2006  . FOOT PAIN, BILATERAL 10/17/2006  . HYPOGLYCEMIA 09/30/2006  . HYPERCHOLESTEROLEMIA 09/30/2006  . MULTIPLE SCLEROSIS 09/30/2006  . GERD 09/30/2006  . FIBROCYSTIC BREAST DISEASE 09/30/2006   Past Medical History:  Diagnosis Date  . Allergic rhinitis   . Allergy   . Anxiety    [place] [facility] [place], no per pt  . Cataract   . Colon polyps 08/2008   Adenomatous  . COPD (chronic obstructive pulmonary disease) ([place])   . Depression    no per pt  . Diabetes mellitus   . Gastritis 08/2008   H. pylori  . GERD (gastroesophageal reflux disease)   . History of shingles    Left V1  . Hypercholesteremia   . Lung nodule   . Menopausal disorder   . Multiple sclerosis ([place])   . Neuromuscular disorder ([place])    Multiple sclerosis  . Plantar fasciitis   . Seizure disorder ([place])    single seizure/hypoglycemia  . Seizures ([place])    1 seizure in 2001 due to blood sugar dropping to 38, none since  . Skin lesion 04/05/2017  . Urinary incontinence   . Vertigo    Past Surgical History:  Procedure Laterality Date  . CERVICAL DISCECTOMY  2004   x 2, Anterior; Fusion C4-5, C5-6, C6-7, Dr. Kary Kos  . CHEST CT  11/2008   With small 4 mm nodule L lung base (rec re check in 1 year)  . CHEST CT  07/2009   Re check chest CT - lung nodule stable  . CHOLECYSTECTOMY    . COLONOSCOPY  08/2008   Polyps/ re check 5 yrs  . CT SINUS LTD W/O CM  02/2009   negative  . DILATION AND CURETTAGE OF UTERUS    .  ESOPHAGOGASTRODUODENOSCOPY  08/2008   Erosive gastritis, h pylori (treated)  . FOOT SURGERY     left plantar fascial problem  . ROTATOR CUFF REPAIR     right  . TONSILLECTOMY    . TUBAL LIGATION    . UPPER GASTROINTESTINAL ENDOSCOPY     Social History   Tobacco Use  . Smoking status: Former Smoker    Packs/day: 0.50    Years: 30.00    Pack years: 15.00    Types: Cigarettes    Quit date: 06/17/2019    Years since quitting: 1.1  . Smokeless tobacco: Never Used  Substance Use Topics  . Alcohol use: No    Alcohol/week: 0.0 standard drinks  . Drug use: No   Family History  Problem Relation Age of Onset  . Migraines Mother   . Heart attack Mother   .  Coronary artery disease Mother   . Coronary artery disease Father        6 bypasses   . Stroke Father   . Diabetes Father   . Renal Disease Father   . Colon cancer Maternal Grandfather 75  . Coronary artery disease Cousin   . Coronary artery disease Paternal Grandmother   . Lung cancer Paternal Aunt   . Multiple sclerosis Neg Hx   . Esophageal cancer Neg Hx   . Rectal cancer Neg Hx   . Stomach cancer Neg Hx   . Breast cancer Neg Hx    Allergies  Allergen Reactions  . Percocet [Oxycodone-Acetaminophen] Shortness Of Breath    Felt like going to pass out and sweating  . Tecfidera [Dimethyl Fumarate] Shortness Of Breath, Palpitations, Rash and Cough  . Amitriptyline Hcl     REACTION: swelling and itching  . Atorvastatin     REACTION: elevated LFT's  . Betaseron [Interferon Beta-1b]     Headache  . Clarithromycin     REACTION: reaction not known  . Duloxetine     REACTION: pain  . Levofloxacin     REACTION: Rash  . Pregabalin     REACTION: LE swelling  . Pseudoephedrine     REACTION: legs hurt  . Zoloft [Sertraline Hcl] Other (See Comments)    Headaches    Current Outpatient Medications on File Prior to Visit  Medication Sig Dispense Refill  . albuterol (VENTOLIN HFA) 108 (90 Base) MCG/ACT inhaler INHALE 2  PUFFS UP TO EVERY 4 HOURS AS NEEDED FOR WHEEZING 3 Inhaler 3  . ALPRAZolam (XANAX) 0.5 MG tablet Take 1 tablet (0.5 mg total) by mouth 2 (two) times daily as needed. For anxiety. 30 tablet 0  . azelastine (ASTELIN) 0.1 % nasal spray Place 2 sprays into both nostrils 2 (two) times daily as needed.  12  . Blood Glucose Monitoring Suppl (ACCU-CHEK AVIVA PLUS) w/Device KIT Check blood sugar once daily and as directed Dx R73.09 1 kit 0  . budesonide-formoterol (SYMBICORT) 160-4.5 MCG/ACT inhaler Inhale 2 puffs into the lungs 2 (two) times daily. (Patient taking differently: Inhale 2 puffs into the lungs daily as needed.) 3 Inhaler 2  . Diclofenac Sodium 3 % GEL Place 1 application onto the skin every 12 (twelve) hours as needed. 50 g 0  . gabapentin (NEURONTIN) 300 MG capsule TAKE 1 CAPSULE BY MOUTH TWICE DURING THE DAY AND 3 CAPSULES BY MOUTH AT NIGHT 450 capsule 3  . ipratropium-albuterol (DUONEB) 0.5-2.5 (3) MG/3ML SOLN TAKE 3 MLS BY NEBULIZATION EVERY 6 (SIX) HOURS AS NEEDED. 360 mL 0  . pantoprazole (PROTONIX) 40 MG tablet Take 40 mg by mouth 2 (two) times daily.     No current facility-administered medications on file prior to visit.    Review of Systems  Constitutional: Positive for fatigue. Negative for activity change, appetite change, fever and unexpected weight change.  HENT: Negative for congestion, ear pain, rhinorrhea, sinus pressure and sore throat.   Eyes: Negative for pain, redness and visual disturbance.  Respiratory: Negative for cough, shortness of breath and wheezing.   Cardiovascular: Negative for chest pain and palpitations.  Gastrointestinal: Negative for abdominal pain, blood in stool, constipation and diarrhea.  Endocrine: Negative for polydipsia and polyuria.  Genitourinary: Negative for dysuria, frequency and urgency.  Musculoskeletal: Negative for arthralgias, back pain and myalgias.  Skin: Negative for pallor and rash.  Allergic/Immunologic: Negative for environmental  allergies.  Neurological: Positive for tremors, weakness, numbness and headaches. Negative for  dizziness, syncope, facial asymmetry and light-headedness.  Hematological: Negative for adenopathy. Does not bruise/bleed easily.  Psychiatric/Behavioral: Negative for decreased concentration and dysphoric mood. The patient is not nervous/anxious.        Objective:   Physical Exam Constitutional:      General: She is not in acute distress.    Appearance: Normal appearance. She is obese. She is not ill-appearing.  HENT:     Head: Normocephalic and atraumatic.     Comments: Mild tenderness of bilat temples No sinus tenderness Eyes:     General: No scleral icterus.       Right eye: No discharge.        Left eye: No discharge.     Conjunctiva/sclera: Conjunctivae normal.     Pupils: Pupils are equal, round, and reactive to light.  Neck:     Vascular: No carotid bruit.     Comments: Some tenderness of cervical musculature (tight) and also trap left more than R Mild crepitus Cardiovascular:     Rate and Rhythm: Normal rate and regular rhythm.     Pulses: Normal pulses.     Heart sounds: Normal heart sounds.  Pulmonary:     Effort: Pulmonary effort is normal. No respiratory distress.     Breath sounds: Normal breath sounds. No wheezing.     Comments: Diffusely distant bs  Musculoskeletal:     Cervical back: Normal range of motion and neck supple. Tenderness present. No rigidity.     Right lower leg: No edema.     Left lower leg: No edema.  Lymphadenopathy:     Cervical: No cervical adenopathy.  Skin:    Coloration: Skin is not pale.     Findings: No erythema or rash.     Comments: Ruddy complexion   Neurological:     Mental Status: She is alert.     Cranial Nerves: Cranial nerves are intact. No cranial nerve deficit or facial asymmetry.     Sensory: No sensory deficit.     Motor: No tremor, atrophy or abnormal muscle tone.     Coordination: Coordination is intact. Coordination  normal.     Gait: Gait is intact.     Deep Tendon Reflexes: Reflexes normal.     Comments: Gait is slow and steady  No focal neuro changes (weakness)  Dec sensation of lt touch on feet   Psychiatric:        Attention and Perception: Attention normal.        Mood and Affect: Affect is blunt.        Speech: Speech normal.        Cognition and Memory: Cognition and memory normal.           Assessment & Plan:   Problem List Items Addressed This Visit      Nervous and Auditory   MULTIPLE SCLEROSIS    Seeing neuro  Did not tolerate last med tried  MRI showed progression  ? If related to head pain      Peripheral neuropathy    Gabapentin 300 bid and 900 at bedtime Sees neuro      Relevant Medications   metaxalone (SKELAXIN) 800 MG tablet     Other   Head pain - Primary    Has seen myself, neurology and ENT (Dr Redmond Baseman) and dermatology Some tinnitus  Some neck pain /spasm and more pain in forehead now as well as L posterior head Some scabs that she picks on scalp  Has tried  gabapentin, prednisone,lyrica, topicals and seb derm tx with no help  No change in exam Will try a muscle relaxer -skelaxin with warning of sedation potential  Plan to message her neurologist as well for more ideas  ? If possible occipital neuralgia, but expect this is multi factorial  33 minutes were spent today both face to face and in the chart obtaining history, reviewing records (neurology, ENT, imaging), performing exam , educating and discussing treatment options         Relevant Medications   metaxalone (SKELAXIN) 800 MG tablet

## 2020-08-13 ENCOUNTER — Telehealth: Payer: Self-pay | Admitting: *Deleted

## 2020-08-13 MED ORDER — TIZANIDINE HCL 4 MG PO CAPS: 4.0000 mg | ORAL_CAPSULE | Freq: Three times a day (TID) | ORAL | 3 refills | Status: DC | PRN

## 2020-08-13 NOTE — Telephone Encounter (Signed)
-----   Message from Ward Givens, NP sent at 08/13/2020  8:17 AM EDT ----- Please get her in sooner with MD  Megan ----- Message ----- From: Tammi Sou, CMA Sent: 08/12/2020  12:41 PM EDT To: Ward Givens, NP, Abner Greenspan, MD  I advised pt of PCP/ neuro's comments, pt said the last time that "neuro didn't do anything about the pain" I advised her that it looks like they said that at the last 2 visits the pain wasn't discussed. Pt said it was and they didn't do anything and that's the reason she got an MRI. I advised pt I could let neuro know she wants to come in sooner for appt or let Dr. Glori Bickers know her concerns. Pt said she is willing to see neuro sooner. I adivse pt they would reach out to her to get her in soon. Pt verbalized understanding and will wait for neuro to f/u with her ----- Message ----- From: Abner Greenspan, MD Sent: 08/11/2020   8:04 PM EDT To: Tammi Sou, CMA  Please let pt know that I touched base with neurology and we can try to get her in just to discuss the head pain if she wants.  Please let me know. ----- Message ----- From: Ward Givens, NP Sent: 08/11/2020   4:13 PM EDT To: Abner Greenspan, MD  Hey,   The last 2 visits I had with her she didn't mention the head pain but we did discuss in a visit in August 2021.  She has an appointment with [place] coming up in September but I am happy to see her sooner if needed.  I think the pain is less likely due to MS but could be occipital neuralgia.  We could do trigger point injections if needed.  Thanks! Megan  ----- Message ----- From: Abner Greenspan, MD Sent: 08/11/2020   1:23 PM EDT To: Ward Givens, NP  Thanks for seeing Ms Heming for her various neurologic issues.  Any further ideas re: head pain (started L occipital-now more features of tension) ? I entertained the possibility of occipital neuralgia, any possible link to her MS? We are going to try a muscle relaxer Thanks for your input She has  a lot of chronic pain issues

## 2020-08-13 NOTE — Telephone Encounter (Signed)
I sent tizanidine

## 2020-08-13 NOTE — Telephone Encounter (Signed)
Received fax from [facility] saying that skelaxin isn't covered under pt's insurance.   Alt meds that are covered are tizanidine or flexeril. They are asking for new Rx to be sent in to replace the skelaxin, please advise

## 2020-08-15 ENCOUNTER — Encounter (HOSPITAL_COMMUNITY): Payer: Self-pay | Admitting: Emergency Medicine

## 2020-08-15 ENCOUNTER — Emergency Department (HOSPITAL_COMMUNITY)
Admission: EM | Admit: 2020-08-15 | Discharge: 2020-08-15 | Disposition: A | Discharge status: Home / Self Care | Payer: Medicare HMO | Attending: Emergency Medicine | Admitting: Emergency Medicine

## 2020-08-15 ENCOUNTER — Emergency Department (HOSPITAL_COMMUNITY): Payer: Medicare HMO

## 2020-08-15 DIAGNOSIS — Z20822 Contact with and (suspected) exposure to covid-19: Secondary | ICD-10-CM | POA: Insufficient documentation

## 2020-08-15 DIAGNOSIS — J449 Chronic obstructive pulmonary disease, unspecified: Secondary | ICD-10-CM | POA: Diagnosis not present

## 2020-08-15 DIAGNOSIS — R519 Headache, unspecified: Secondary | ICD-10-CM | POA: Diagnosis not present

## 2020-08-15 DIAGNOSIS — R509 Fever, unspecified: Secondary | ICD-10-CM | POA: Diagnosis not present

## 2020-08-15 DIAGNOSIS — J45909 Unspecified asthma, uncomplicated: Secondary | ICD-10-CM | POA: Insufficient documentation

## 2020-08-15 DIAGNOSIS — G44221 Chronic tension-type headache, intractable: Secondary | ICD-10-CM | POA: Insufficient documentation

## 2020-08-15 DIAGNOSIS — R531 Weakness: Secondary | ICD-10-CM | POA: Insufficient documentation

## 2020-08-15 DIAGNOSIS — Z87891 Personal history of nicotine dependence: Secondary | ICD-10-CM | POA: Insufficient documentation

## 2020-08-15 DIAGNOSIS — E119 Type 2 diabetes mellitus without complications: Secondary | ICD-10-CM | POA: Insufficient documentation

## 2020-08-15 DIAGNOSIS — R110 Nausea: Secondary | ICD-10-CM | POA: Diagnosis not present

## 2020-08-15 DIAGNOSIS — G4489 Other headache syndrome: Secondary | ICD-10-CM | POA: Diagnosis not present

## 2020-08-15 DIAGNOSIS — R197 Diarrhea, unspecified: Secondary | ICD-10-CM | POA: Insufficient documentation

## 2020-08-15 DIAGNOSIS — R0981 Nasal congestion: Secondary | ICD-10-CM | POA: Diagnosis not present

## 2020-08-15 DIAGNOSIS — G8929 Other chronic pain: Secondary | ICD-10-CM

## 2020-08-15 DIAGNOSIS — R630 Anorexia: Secondary | ICD-10-CM | POA: Diagnosis not present

## 2020-08-15 DIAGNOSIS — R059 Cough, unspecified: Secondary | ICD-10-CM | POA: Diagnosis not present

## 2020-08-15 LAB — COMPREHENSIVE METABOLIC PANEL
ALT: 12 U/L (ref 0–44)
AST: 20 U/L (ref 15–41)
Albumin: 3.2 g/dL — ABNORMAL LOW (ref 3.5–5.0)
Alkaline Phosphatase: 57 U/L (ref 38–126)
Anion gap: 10 (ref 5–15)
BUN: 8 mg/dL (ref 8–23)
CO2: 24 mmol/L (ref 22–32)
Calcium: 8.9 mg/dL (ref 8.9–10.3)
Chloride: 98 mmol/L (ref 98–111)
Creatinine, Ser: 1.09 mg/dL — ABNORMAL HIGH (ref 0.44–1.00)
GFR, Estimated: 57 mL/min — ABNORMAL LOW (ref >60)
Glucose, Bld: 119 mg/dL — ABNORMAL HIGH (ref 70–99)
Potassium: 3.5 mmol/L (ref 3.5–5.1)
Sodium: 132 mmol/L — ABNORMAL LOW (ref 135–145)
Total Bilirubin: 0.6 mg/dL (ref 0.3–1.2)
Total Protein: 7.1 g/dL (ref 6.5–8.1)

## 2020-08-15 LAB — CBC WITH DIFFERENTIAL/PLATELET
Abs Immature Granulocytes: 0.05 10*3/uL (ref 0.00–0.07)
Basophils Absolute: 0.1 10*3/uL (ref 0.0–0.1)
Basophils Relative: 0 %
Eosinophils Absolute: 0 10*3/uL (ref 0.0–0.5)
Eosinophils Relative: 0 %
HCT: 40.1 % (ref 36.0–46.0)
Hemoglobin: 13.2 g/dL (ref 12.0–15.0)
Immature Granulocytes: 0 %
Lymphocytes Relative: 9 %
Lymphs Abs: 1.1 10*3/uL (ref 0.7–4.0)
MCH: 31.7 pg (ref 26.0–34.0)
MCHC: 32.9 g/dL (ref 30.0–36.0)
MCV: 96.2 fL (ref 80.0–100.0)
Monocytes Absolute: 1.1 10*3/uL — ABNORMAL HIGH (ref 0.1–1.0)
Monocytes Relative: 9 %
Neutro Abs: 9.5 10*3/uL — ABNORMAL HIGH (ref 1.7–7.7)
Neutrophils Relative %: 82 %
Platelets: 219 10*3/uL (ref 150–400)
RBC: 4.17 MIL/uL (ref 3.87–5.11)
RDW: 12.5 % (ref 11.5–15.5)
WBC: 11.8 10*3/uL — ABNORMAL HIGH (ref 4.0–10.5)
nRBC: 0 % (ref 0.0–0.2)

## 2020-08-15 LAB — URINALYSIS, ROUTINE W REFLEX MICROSCOPIC
Bilirubin Urine: NEGATIVE
Glucose, UA: NEGATIVE mg/dL
Ketones, ur: NEGATIVE mg/dL
Leukocytes,Ua: NEGATIVE
Nitrite: NEGATIVE
Protein, ur: NEGATIVE mg/dL
Specific Gravity, Urine: 1.008 (ref 1.005–1.030)
pH: 5 (ref 5.0–8.0)

## 2020-08-15 LAB — RESP PANEL BY RT-PCR (FLU A&B, COVID) ARPGX2
Influenza A by PCR: NEGATIVE
Influenza B by PCR: NEGATIVE
SARS Coronavirus 2 by RT PCR: NEGATIVE

## 2020-08-15 LAB — LIPASE, BLOOD: Lipase: 29 U/L (ref 11–51)

## 2020-08-15 MED ORDER — METOCLOPRAMIDE HCL 5 MG/ML IJ SOLN
10.0000 mg | Freq: Once | INTRAMUSCULAR | Status: AC
  Administered 2020-08-15: 10 mg via INTRAVENOUS
  Filled 2020-08-15: qty 2

## 2020-08-15 MED ORDER — ACETAMINOPHEN 500 MG PO TABS
1000.0000 mg | ORAL_TABLET | Freq: Once | ORAL | Status: AC
  Administered 2020-08-15: 1000 mg via ORAL
  Filled 2020-08-15: qty 2

## 2020-08-15 MED ORDER — DIPHENHYDRAMINE HCL 50 MG/ML IJ SOLN
50.0000 mg | Freq: Once | INTRAMUSCULAR | Status: AC
  Administered 2020-08-15: 50 mg via INTRAVENOUS
  Filled 2020-08-15: qty 1

## 2020-08-15 MED ORDER — SODIUM CHLORIDE 0.9 % IV BOLUS
1000.0000 mL | Freq: Once | INTRAVENOUS | Status: AC
  Administered 2020-08-15: 1000 mL via INTRAVENOUS

## 2020-08-15 MED ORDER — ONDANSETRON 4 MG PO TBDP: 4.0000 mg | ORAL_TABLET | Freq: Three times a day (TID) | ORAL | 0 refills | Status: DC | PRN

## 2020-08-15 NOTE — ED Triage Notes (Signed)
Pt here from home with c/o h/a times 1 month after using some bug spray , pt was seen by pcp and sent in scripts but pt has been unable to pick those up as of yet

## 2020-08-15 NOTE — ED Provider Notes (Signed)
[facility] EMERGENCY DEPARTMENT Provider Note   CSN: [id number] Arrival date & time: 08/15/20  0744     History No chief complaint on file.   Karina [name] is a 65 y.o. female with a history of multiple sclerosis, diabetes mellitus, COPD, GERD.  Patient presents to the emergency department with a chief complaint of headache.  Patient reports that her headache has been present over the last year.  Patient reports that headache has been constant over this time.  Patient reports that headache has not changed over this time period.  Patient denies any alleviating or aggravating factors.  Pain is located from occipital region and extends all the way to her temporal region.  Patient describes pain as a throbbing.  Patient rates pain 10/10 on the pain scale.  She also reports that she has had neck stiffness over this last year as well.  Patient denies any change in neck stiffness.  Denies any recent falls or traumatic injuries.  Denies any facial asymmetry, slurred speech, numbness, weakness, seizures, syncope.  Patient endorses fever, chills, nasal congestion, nausea, diarrhea, decreased appetite and generalized weakness since Monday.  Patient states that she has a baseline productive cough due to her COPD.  Patient denies any change in sputum production or color.  Also endorses urinary frequency.  Patient denies any known sick contacts.  Patient denies being vaccinated for COVID-19 or influenza.  Patient denies any shortness of breath, chest pain, palpitations, leg swelling, abdominal pain, vomiting, dysuria, hematuria, vaginal pain, vaginal bleeding, vaginal discharge, neck pain, back pain, rash.    Patient has not taken any medication today to help with fever or headache.  Per chart review patient saw her PCP on 6/6 for her head pain.  Patient has had previous trials of gabapentin, Lyrica, and prednisone with no relief of symptoms.  Patient was prescribed Skelaxin however has not  been able to pick up this medication.  Patient was afebrile at this visit.  Patient is followed by neurologist Dr. Redmond Baseman with [facility] for her multiple sclerosis.  Patient last saw neurology March 2022.   Note does state patient had recent MRI which showed progression of her MS. review shows that patient has reported similar occipital pain in the past.      HPI     Past Medical History:  Diagnosis Date   Allergic rhinitis    Allergy    Anxiety    [place], no per pt   Cataract    Colon polyps 08/2008   Adenomatous   COPD (chronic obstructive pulmonary disease) ([place])    Depression    no per pt   Diabetes mellitus    Gastritis 08/2008   H. pylori   GERD (gastroesophageal reflux disease)    History of shingles    Left V1   Hypercholesteremia    Lung nodule    Menopausal disorder    Multiple sclerosis ([place])    Neuromuscular disorder ([place])    Multiple sclerosis   Plantar fasciitis    Seizure disorder ([place])    single seizure/hypoglycemia   Seizures ([place])    1 seizure in 2001 due to blood sugar dropping to 38, none since   Skin lesion 04/05/2017   Urinary incontinence    Vertigo     Patient Active Problem List   Diagnosis Date Noted   Joint pain in both hands 10/12/2019   Wrist swelling 10/12/2019   Productive cough 08/22/2019   Head pain 08/22/2019  Elevated serum creatinine 04/24/2019   Frequent urination 04/20/2019   Localized swelling, mass and lump, neck 04/20/2019   Decreased appetite 04/20/2019   Screening mammogram, encounter for 04/20/2019   Weight loss 02/12/2019   Rib pain 05/05/2018   Seborrheic keratosis, inflamed 12/15/2017   B12 deficiency 08/24/2017   Left low back pain 08/24/2017   Erosive osteoarthritis of both hands 09/09/2016   Fatigue 09/07/2016   Bilateral hand pain 09/07/2016   Routine general medical examination at a health care facility 05/06/2015   Large breasts 05/06/2015   Lumbar disc disease  11/01/2014   Knee pain, bilateral 03/11/2014   Thoracic back pain 11/30/2013   Encounter for routine gynecological examination 01/31/2013   Encounter for Medicare annual wellness exam 01/23/2013   Chest pain 09/20/2012   SOB (shortness of breath) 09/20/2012   Dyspnea 09/13/2012   Asthmatic bronchitis , chronic (HCC) 12/06/2011   Chronic cough 11/22/2011   Anxiety disorder 07/17/2009   HEMORRHOIDS-EXTERNAL 11/07/2008   IRRITABLE BOWEL SYNDROME 11/07/2008   Lung nodule 10/14/2008   Prediabetes 10/14/2008   PERSONAL HX COLONIC POLYPS 10/08/2008   Vitamin D deficiency 07/23/2008   SYNCOPE, HX OF 06/01/2008   FOOT SURGERY, HX OF 06/01/2008   DILATION AND CURETTAGE, HX OF 06/01/2008   OSTEOARTHRITIS, SHOULDER, RIGHT 03/22/2008   ROTATOR CUFF SYNDROME, RIGHT 03/22/2008   ARTHRALGIA 12/12/2007   Allergic rhinitis 08/01/2007   Smoker 10/17/2006   Peripheral neuropathy 10/17/2006   FOOT PAIN, BILATERAL 10/17/2006   HYPOGLYCEMIA 09/30/2006   HYPERCHOLESTEROLEMIA 09/30/2006   MULTIPLE SCLEROSIS 09/30/2006   GERD 09/30/2006   FIBROCYSTIC BREAST DISEASE 09/30/2006    Past Surgical History:  Procedure Laterality Date   CERVICAL DISCECTOMY  2004   x 2, Anterior; Fusion C4-5, C5-6, C6-7, Dr. Kary Kos   CHEST CT  11/2008   With small 4 mm nodule L lung base (rec re check in 1 year)   CHEST CT  07/2009   Re check chest CT - lung nodule stable   CHOLECYSTECTOMY     COLONOSCOPY  08/2008   Polyps/ re check 5 yrs   CT SINUS LTD W/O CM  02/2009   negative   DILATION AND CURETTAGE OF UTERUS     ESOPHAGOGASTRODUODENOSCOPY  08/2008   Erosive gastritis, h pylori (treated)   FOOT SURGERY     left plantar fascial problem   ROTATOR CUFF REPAIR     right   TONSILLECTOMY     TUBAL LIGATION     UPPER GASTROINTESTINAL ENDOSCOPY       OB History   No obstetric history on file.     Family History  Problem Relation Age of Onset   Migraines Mother    Heart attack Mother    Coronary  artery disease Mother    Coronary artery disease Father        6 bypasses    Stroke Father    Diabetes Father    Renal Disease Father    Colon cancer Maternal Grandfather 14   Coronary artery disease Cousin    Coronary artery disease Paternal Grandmother    Lung cancer Paternal Aunt    Multiple sclerosis Neg Hx    Esophageal cancer Neg Hx    Rectal cancer Neg Hx    Stomach cancer Neg Hx    Breast cancer Neg Hx     Social History   Tobacco Use   Smoking status: Former    Packs/day: 0.50    Years: 30.00    Pack  years: 15.00    Types: Cigarettes    Quit date: 06/17/2019    Years since quitting: 1.1   Smokeless tobacco: Never  Substance Use Topics   Alcohol use: No    Alcohol/week: 0.0 standard drinks   Drug use: No    Home Medications Prior to Admission medications   Medication Sig Start Date End Date Taking? Authorizing Provider  albuterol (VENTOLIN HFA) 108 (90 Base) MCG/ACT inhaler INHALE 2 PUFFS UP TO EVERY 4 HOURS AS NEEDED FOR WHEEZING 09/10/16   [name], Wynelle Fanny, MD  ALPRAZolam Duanne Moron) 0.5 MG tablet Take 1 tablet (0.5 mg total) by mouth 2 (two) times daily as needed. For anxiety. 02/11/15   [name], Wynelle Fanny, MD  azelastine (ASTELIN) 0.1 % nasal spray Place 2 sprays into both nostrils 2 (two) times daily as needed. 09/20/14   [provider]  Blood Glucose Monitoring Suppl (ACCU-CHEK AVIVA PLUS) w/Device KIT Check blood sugar once daily and as directed Dx R73.09 08/19/15   [name], Wynelle Fanny, MD  budesonide-formoterol [facility]) 160-4.5 MCG/ACT inhaler Inhale 2 puffs into the lungs 2 (two) times daily. Patient taking differently: Inhale 2 puffs into the lungs daily as needed. 08/19/15   [name], Wynelle Fanny, MD  Diclofenac Sodium 3 % GEL Place 1 application onto the skin every 12 (twelve) hours as needed. 06/18/16   Schaevitz, Randall An, MD  gabapentin (NEURONTIN) 300 MG capsule TAKE 1 CAPSULE BY MOUTH TWICE DURING THE DAY AND 3 CAPSULES BY MOUTH AT NIGHT 04/14/20   Millikan,  Jinny Blossom, NP  ipratropium-albuterol (DUONEB) 0.5-2.5 (3) MG/3ML SOLN TAKE 3 MLS BY NEBULIZATION EVERY 6 (SIX) HOURS AS NEEDED. 09/05/18   Laverle Hobby, MD  pantoprazole (PROTONIX) 40 MG tablet Take 40 mg by mouth 2 (two) times daily. 09/05/19   [provider]  tiZANidine (ZANAFLEX) 4 MG capsule Take 1 capsule (4 mg total) by mouth 3 (three) times daily as needed for muscle spasms. 08/13/20   [name], Wynelle Fanny, MD    Allergies    Percocet [oxycodone-acetaminophen], Tecfidera [dimethyl fumarate], Amitriptyline hcl, Atorvastatin, Betaseron [interferon beta-1b], Clarithromycin, Duloxetine, Levofloxacin, Pregabalin, Pseudoephedrine, and Zoloft [sertraline hcl]  Review of Systems   Review of Systems  Constitutional:  Positive for appetite change (Decreased), chills, fatigue and fever.  HENT:  Positive for congestion. Negative for rhinorrhea and sore throat.   Eyes:  Negative for visual disturbance.  Respiratory:  Positive for cough. Negative for shortness of breath.   Cardiovascular:  Negative for chest pain, palpitations and leg swelling.  Gastrointestinal:  Positive for diarrhea and nausea. Negative for abdominal distention, abdominal pain and vomiting.  Genitourinary:  Positive for frequency. Negative for difficulty urinating, dysuria, flank pain, hematuria, vaginal bleeding, vaginal discharge and vaginal pain.  Musculoskeletal:  Negative for back pain and neck pain.  Skin:  Negative for color change and rash.  Neurological:  Positive for headaches. Negative for dizziness, tremors, seizures, syncope, facial asymmetry, speech difficulty, weakness, light-headedness and numbness.  Psychiatric/Behavioral:  Negative for confusion.    Physical Exam Updated Vital Signs BP 117/66   Pulse 90   Temp (!) 101.2 F (38.4 C) (Oral)   Resp 18   SpO2 95%   Physical Exam Vitals and nursing note reviewed.  Constitutional:      General: She is not in acute distress.    Appearance: She is  ill-appearing. She is not toxic-appearing or diaphoretic.  HENT:     Head: Normocephalic and atraumatic.     Jaw: No trismus or pain on  movement.     Mouth/Throat:     Pharynx: Oropharynx is clear. Uvula midline. No pharyngeal swelling, oropharyngeal exudate, posterior oropharyngeal erythema or uvula swelling.  Eyes:     General: No scleral icterus.       Right eye: No discharge.        Left eye: No discharge.     Extraocular Movements: Extraocular movements intact.     Conjunctiva/sclera:     Right eye: Right conjunctiva is not injected. No chemosis, exudate or hemorrhage.    Left eye: Left conjunctiva is not injected. No chemosis, exudate or hemorrhage.    Pupils: Pupils are equal, round, and reactive to light.  Neck:     Meningeal: Brudzinski's sign absent.     Comments: Negative Brudzinski's Cardiovascular:     Rate and Rhythm: Normal rate.  Pulmonary:     Effort: Pulmonary effort is normal. No tachypnea, bradypnea or respiratory distress.     Breath sounds: Normal breath sounds. No stridor.  Abdominal:     General: Bowel sounds are normal. There is no distension. There are no signs of injury.     Palpations: Abdomen is soft. There is no mass or pulsatile mass.     Tenderness: There is no abdominal tenderness. There is no guarding or rebound.     Comments: 2 small scabs noted to right lower quadrant of abdomen, no purulent discharge or surrounding erythema  Musculoskeletal:     Cervical back: Full passive range of motion without pain, normal range of motion and neck supple. No edema, erythema, signs of trauma, rigidity, torticollis or crepitus. No pain with movement, spinous process tenderness or muscular tenderness. Normal range of motion.     Right lower leg: No swelling or tenderness. No edema.     Left lower leg: No swelling or tenderness. No edema.  Skin:    General: Skin is warm and dry.     Findings: No petechiae or rash. Rash is not purpuric.  Neurological:      General: No focal deficit present.     Mental Status: She is alert and oriented to person, place, and time.     GCS: GCS eye subscore is 4. GCS verbal subscore is 5. GCS motor subscore is 6.     Cranial Nerves: No cranial nerve deficit or facial asymmetry.     Sensory: Sensation is intact.     Motor: No weakness, tremor, seizure activity or pronator drift.     Coordination: Romberg sign negative. Finger-Nose-Finger Test normal.     Gait: Gait is intact. Gait normal.     Comments: CN II-XII intact, equal grip strength, +5 strength to bilateral upper and lower extremities, sensation to light touch intact to bilateral upper and lower extremities  Psychiatric:        Behavior: Behavior is cooperative.    ED Results / Procedures / Treatments   Labs (all labs ordered are listed, but only abnormal results are displayed) Labs Reviewed  CBC WITH DIFFERENTIAL/PLATELET - Abnormal; Notable for the following components:      Result Value   WBC 11.8 (*)    Neutro Abs 9.5 (*)    Monocytes Absolute 1.1 (*)    All other components within normal limits  COMPREHENSIVE METABOLIC PANEL - Abnormal; Notable for the following components:   Sodium 132 (*)    Glucose, Bld 119 (*)    Creatinine, Ser 1.09 (*)    Albumin 3.2 (*)    GFR, Estimated 57 (*)  All other components within normal limits  URINALYSIS, ROUTINE W REFLEX MICROSCOPIC - Abnormal; Notable for the following components:   Hgb urine dipstick MODERATE (*)    Bacteria, UA RARE (*)    All other components within normal limits  RESP PANEL BY RT-PCR (FLU A&B, COVID) ARPGX2  LIPASE, BLOOD    EKG None  Radiology DG Chest Portable 1 View  Result Date: 08/15/2020 CLINICAL DATA:  Cough. EXAM: PORTABLE CHEST 1 VIEW COMPARISON:  None. FINDINGS: The heart size and mediastinal contours are within normal limits. Both lungs are clear. The visualized skeletal structures are unremarkable. IMPRESSION: No active disease. Electronically Signed   By: Marijo Conception M.D.   On: 08/15/2020 08:51    Procedures Procedures   Medications Ordered in ED Medications  acetaminophen (TYLENOL) tablet 1,000 mg (has no administration in time range)  metoCLOPramide (REGLAN) injection 10 mg (has no administration in time range)  diphenhydrAMINE (BENADRYL) injection 50 mg (has no administration in time range)  sodium chloride 0.9 % bolus 1,000 mL (has no administration in time range)    ED Course  I have reviewed the triage vital signs and the nursing notes.  Pertinent labs & imaging results that were available during my care of the patient were reviewed by me and considered in my medical decision making (see chart for details).    MDM Rules/Calculators/A&P                          Alert 65 year old female no acute distress, nontoxic-appearing.  Patient does appear ill.  Presents to the emergency department with a chief complaint of headache.  Patient reports that this pain has been present over the last year.  Patient has had no change in her pain recently.  Patient denies any falls or traumatic injuries.  Denies any focal neurological deficits.  Chart review patient has had similar pain in the past and has seen her neurologist Dr. Redmond Baseman for it.  Patient did see PCP 2 days prior for same complaint of headache.  She was prescribed muscle relaxer however was unable to pick up this medication.  Chart review also shows that PCP and neurology suspect occipital neuralgia.  Review of systems patient also endorses fever, chills, productive cough, nasal congestion, generalized weakness, and decreased appetite starting on Monday.  Patient has no known sick contacts.  Patient denies being vaccinated for COVID-19 or influenza.  Physical exam patient has no neurological deficits.  Normal range of motion to cervical neck, neck supple, negative presents ED sign.  Lungs clear to auscultation bilaterally.  Patient is in no acute respiratory distress.  Patient is febrile at  101.2 F.  Low suspicion for intracranial abnormality as patient has normal neuro exam, no reports of neurological deficits, receives MRIs regularly due to her MS.  If patient migraine cocktail and reevaluate.  Low suspicion for COPD exacerbation as patient reports no change in sputum production or color.  Suspect possible viral etiology for patient's symptoms of fever, chills, and generalized weakness.  Will obtain chest x-ray, respiratory panel, UA, and basic lab work.  Patient given Tylenol for her fever.  Respiratory panel negative for COVID-19 and influenza. Urinalysis shows no signs of urinary tract infection. CBC shows leukocytosis at 11.8. CMP shows creatinine elevated at 1.09, BUN within normal limits.  We will give patient 1 L fluid bolus.   Lipase within normal limits. Chest x-ray shows no active cardiopulmonary disease.  On serial reexamination  patient reports minimal improvement in her headache after migraine cocktail.  Patient now rates pain 7/10 on the pain scale.  Patient has no focal neurological deficits.  Patient headache is chronic and documented as far back as 2021.  Patient has had multiple MRIs over this time..  No further imaging ordered at this time.  We will have patient follow-up with her neurologist for further assessment.  Suspect that patient has overlying viral infection.  Patient advised to use Tylenol and ibuprofen to control her temperature.  Patient given strict return precautions.  Patient expressed understanding of all instructions and is agreeable with this plan.  Patient care and treatment were discussed with attending physician Dr. Dina Rich.   Final Clinical Impression(s) / ED Diagnoses Final diagnoses:  Chronic intractable headache, unspecified headache type  Fever, unspecified fever cause    Rx / DC Orders ED Discharge Orders          Ordered    ondansetron (ZOFRAN ODT) 4 MG disintegrating tablet  Every 8 hours PRN        08/15/20 1219              Loni Beckwith, PA-C 08/15/20 1925    Lorelle Gibbs, DO 08/16/20 [phone]

## 2020-08-15 NOTE — Discharge Instructions (Addendum)
You came to the emergency department today to be evaluated for your headache and flulike symptoms.  Your physical exam and lab work were reassuring.  You tested negative for COVID-19 and influenza.  Your flulike symptoms are likely due to a viral infection.  Please make sure to take Tylenol and ibuprofen to help keep your fever down.  Have given you prescription for Zofran.  You may take this medication to help with nausea.  You may take 1 pill every 8 hours as needed for nausea or vomiting.  Your lab work showed that your kidney function was slightly elevated.  Please make sure to stay well-hydrated.  Please follow-up with your primary care provider to recheck this lab.  Please follow-up with your neurologist to discuss your chronic head pain.  Get help right away if: Your headache becomes severe quickly. Your headache gets worse after moderate to intense physical activity. You have repeated vomiting. You have a stiff neck. You have a loss of vision. You have problems with speech. You have pain in the eye or ear. You have muscular weakness or loss of muscle control. You lose your balance or have trouble walking. You feel faint or pass out. You have confusion. You have a seizure.

## 2020-08-18 ENCOUNTER — Emergency Department (HOSPITAL_COMMUNITY): Payer: Medicare HMO

## 2020-08-18 ENCOUNTER — Emergency Department (HOSPITAL_COMMUNITY)
Admission: EM | Admit: 2020-08-18 | Discharge: 2020-08-18 | Disposition: A | Discharge status: Home / Self Care | Payer: Medicare HMO | Attending: Emergency Medicine | Admitting: Emergency Medicine

## 2020-08-18 ENCOUNTER — Other Ambulatory Visit: Payer: Self-pay

## 2020-08-18 ENCOUNTER — Telehealth: Payer: Self-pay

## 2020-08-18 DIAGNOSIS — Z7951 Long term (current) use of inhaled steroids: Secondary | ICD-10-CM | POA: Insufficient documentation

## 2020-08-18 DIAGNOSIS — I10 Essential (primary) hypertension: Secondary | ICD-10-CM | POA: Diagnosis not present

## 2020-08-18 DIAGNOSIS — R0602 Shortness of breath: Secondary | ICD-10-CM | POA: Diagnosis not present

## 2020-08-18 DIAGNOSIS — R509 Fever, unspecified: Secondary | ICD-10-CM | POA: Diagnosis not present

## 2020-08-18 DIAGNOSIS — R630 Anorexia: Secondary | ICD-10-CM | POA: Diagnosis not present

## 2020-08-18 DIAGNOSIS — K219 Gastro-esophageal reflux disease without esophagitis: Secondary | ICD-10-CM | POA: Diagnosis not present

## 2020-08-18 DIAGNOSIS — Z20822 Contact with and (suspected) exposure to covid-19: Secondary | ICD-10-CM | POA: Insufficient documentation

## 2020-08-18 DIAGNOSIS — J449 Chronic obstructive pulmonary disease, unspecified: Secondary | ICD-10-CM | POA: Diagnosis not present

## 2020-08-18 DIAGNOSIS — R059 Cough, unspecified: Secondary | ICD-10-CM | POA: Diagnosis not present

## 2020-08-18 DIAGNOSIS — J189 Pneumonia, unspecified organism: Secondary | ICD-10-CM

## 2020-08-18 DIAGNOSIS — R309 Painful micturition, unspecified: Secondary | ICD-10-CM | POA: Diagnosis not present

## 2020-08-18 DIAGNOSIS — E1136 Type 2 diabetes mellitus with diabetic cataract: Secondary | ICD-10-CM | POA: Diagnosis not present

## 2020-08-18 DIAGNOSIS — R109 Unspecified abdominal pain: Secondary | ICD-10-CM | POA: Insufficient documentation

## 2020-08-18 DIAGNOSIS — J45909 Unspecified asthma, uncomplicated: Secondary | ICD-10-CM | POA: Insufficient documentation

## 2020-08-18 DIAGNOSIS — E7800 Pure hypercholesterolemia, unspecified: Secondary | ICD-10-CM | POA: Insufficient documentation

## 2020-08-18 DIAGNOSIS — J181 Lobar pneumonia, unspecified organism: Secondary | ICD-10-CM | POA: Diagnosis not present

## 2020-08-18 DIAGNOSIS — R1110 Vomiting, unspecified: Secondary | ICD-10-CM | POA: Insufficient documentation

## 2020-08-18 DIAGNOSIS — A419 Sepsis, unspecified organism: Secondary | ICD-10-CM | POA: Diagnosis not present

## 2020-08-18 DIAGNOSIS — E1169 Type 2 diabetes mellitus with other specified complication: Secondary | ICD-10-CM | POA: Insufficient documentation

## 2020-08-18 DIAGNOSIS — R1084 Generalized abdominal pain: Secondary | ICD-10-CM | POA: Diagnosis not present

## 2020-08-18 DIAGNOSIS — R112 Nausea with vomiting, unspecified: Secondary | ICD-10-CM | POA: Diagnosis not present

## 2020-08-18 DIAGNOSIS — Z87891 Personal history of nicotine dependence: Secondary | ICD-10-CM | POA: Diagnosis not present

## 2020-08-18 DIAGNOSIS — R197 Diarrhea, unspecified: Secondary | ICD-10-CM | POA: Diagnosis not present

## 2020-08-18 DIAGNOSIS — E1142 Type 2 diabetes mellitus with diabetic polyneuropathy: Secondary | ICD-10-CM | POA: Insufficient documentation

## 2020-08-18 DIAGNOSIS — R000 Tachycardia, unspecified: Secondary | ICD-10-CM | POA: Diagnosis not present

## 2020-08-18 LAB — URINALYSIS, ROUTINE W REFLEX MICROSCOPIC
Bilirubin Urine: NEGATIVE
Glucose, UA: NEGATIVE mg/dL
Ketones, ur: 5 mg/dL — AB
Leukocytes,Ua: NEGATIVE
Nitrite: NEGATIVE
Protein, ur: NEGATIVE mg/dL
Specific Gravity, Urine: 1.004 — ABNORMAL LOW (ref 1.005–1.030)
pH: 6 (ref 5.0–8.0)

## 2020-08-18 LAB — COMPREHENSIVE METABOLIC PANEL
ALT: 30 U/L (ref 0–44)
AST: 37 U/L (ref 15–41)
Albumin: 2.4 g/dL — ABNORMAL LOW (ref 3.5–5.0)
Alkaline Phosphatase: 66 U/L (ref 38–126)
Anion gap: 13 (ref 5–15)
BUN: 9 mg/dL (ref 8–23)
CO2: 25 mmol/L (ref 22–32)
Calcium: 8.5 mg/dL — ABNORMAL LOW (ref 8.9–10.3)
Chloride: 93 mmol/L — ABNORMAL LOW (ref 98–111)
Creatinine, Ser: 1 mg/dL (ref 0.44–1.00)
GFR, Estimated: >60 mL/min (ref >60)
Glucose, Bld: 113 mg/dL — ABNORMAL HIGH (ref 70–99)
Potassium: 2.9 mmol/L — ABNORMAL LOW (ref 3.5–5.1)
Sodium: 131 mmol/L — ABNORMAL LOW (ref 135–145)
Total Bilirubin: 0.6 mg/dL (ref 0.3–1.2)
Total Protein: 6.7 g/dL (ref 6.5–8.1)

## 2020-08-18 LAB — CBC
HCT: 35.8 % — ABNORMAL LOW (ref 36.0–46.0)
Hemoglobin: 12.1 g/dL (ref 12.0–15.0)
MCH: 31.5 pg (ref 26.0–34.0)
MCHC: 33.8 g/dL (ref 30.0–36.0)
MCV: 93.2 fL (ref 80.0–100.0)
Platelets: 283 10*3/uL (ref 150–400)
RBC: 3.84 MIL/uL — ABNORMAL LOW (ref 3.87–5.11)
RDW: 12.7 % (ref 11.5–15.5)
WBC: 11.7 10*3/uL — ABNORMAL HIGH (ref 4.0–10.5)
nRBC: 0 % (ref 0.0–0.2)

## 2020-08-18 LAB — RESP PANEL BY RT-PCR (FLU A&B, COVID) ARPGX2
Influenza A by PCR: NEGATIVE
Influenza B by PCR: NEGATIVE
SARS Coronavirus 2 by RT PCR: NEGATIVE

## 2020-08-18 LAB — LIPASE, BLOOD: Lipase: 34 U/L (ref 11–51)

## 2020-08-18 LAB — LACTIC ACID, PLASMA: Lactic Acid, Venous: 1 mmol/L (ref 0.5–1.9)

## 2020-08-18 MED ORDER — LACTATED RINGERS IV BOLUS (SEPSIS)
1000.0000 mL | Freq: Once | INTRAVENOUS | Status: AC
  Administered 2020-08-18: 1000 mL via INTRAVENOUS

## 2020-08-18 MED ORDER — ACETAMINOPHEN 500 MG PO TABS
1000.0000 mg | ORAL_TABLET | Freq: Once | ORAL | Status: AC
  Administered 2020-08-18: 1000 mg via ORAL
  Filled 2020-08-18: qty 2

## 2020-08-18 MED ORDER — AMOXICILLIN-POT CLAVULANATE 875-125 MG PO TABS: 1.0000 | ORAL_TABLET | Freq: Two times a day (BID) | ORAL | 0 refills | Status: DC

## 2020-08-18 MED ORDER — SODIUM CHLORIDE 0.9 % IV SOLN
500.0000 mg | INTRAVENOUS | Status: DC
  Administered 2020-08-18: 500 mg via INTRAVENOUS
  Filled 2020-08-18: qty 500

## 2020-08-18 MED ORDER — SODIUM CHLORIDE 0.9 % IV SOLN
2.0000 g | INTRAVENOUS | Status: DC
  Administered 2020-08-18: 2 g via INTRAVENOUS
  Filled 2020-08-18: qty 20

## 2020-08-18 MED ORDER — LACTATED RINGERS IV SOLN: INTRAVENOUS | Status: DC

## 2020-08-18 NOTE — Telephone Encounter (Signed)
Per chart review tab pt is presently a [place].

## 2020-08-18 NOTE — Telephone Encounter (Signed)
Aware and will watch for notes in epic

## 2020-08-18 NOTE — Telephone Encounter (Signed)
[facility] Night - Client TELEPHONE ADVICE RECORD [facility] Patient Name: Karina [name] [place] [facility] Gender: Female DOB: 06-Apr-1955 Age: 65 Y 34 M 30 D Return Phone Number: [phone] (Primary), [phone] (Secondary) Address[place] [address] Client [facility] Night - Client Client Site [place] Physician [name], Roque Lias - MD Contact Type Call Who Is Calling Patient / Member / Family / Caregiver Call Type Triage / Clinical Caller Name Antony Haste Relationship To Patient Spouse Return Phone Number [phone] (Primary) Chief Complaint ABDOMINAL PAIN - Severe and only in abdomen Reason for Call Symptomatic / Request for [place] states his wife has severe kidney pain and she went to the emergency oom and they told her that her kidneys was messed up . Translation No Nurse Assessment Nurse: Rodney Cruise, RN, Hilliard Clark Date/Time (Eastern Time): 08/18/2020 7:09:23 AM Confirm and document reason for call. If symptomatic, describe symptoms. ---Has not been able to eat all week, is able to drink water and Gatorade, client feels needs to be in the hospital. Feels is dehydrated. fever of 101 oral. Does the patient have any new or worsening symptoms? ---Yes Will a triage be completed? ---Yes Related visit to physician within the last 2 weeks? ---Yes Does the PT have any chronic conditions? (i.e. diabetes, asthma, this includes High risk factors for pregnancy, etc.) ---Yes List chronic conditions. ---Kidney concerns, MS. Is this a behavioral health or substance abuse call? ---No Guidelines Guideline Title Affirmed Question Affirmed Notes Nurse Date/Time (Eastern Time) Vomiting Shock suspected (e.g., cold/pale/ clammy skin, too weak to stand, low BP, rapid pulse) Baxter, RN, Sean 08/18/2020 7:12:18 AM

## 2020-08-18 NOTE — Progress Notes (Signed)
Elink following for code sepsis 

## 2020-08-18 NOTE — ED Triage Notes (Signed)
Pt arrives via [facility] from home with c/o abdominal pain, N/V/D X 1day Pt febrile on arrival at 103.0

## 2020-08-18 NOTE — Discharge Instructions (Addendum)
Make sure you are getting plenty of rest and drink a lot of fluids.  Take Tylenol every 4 hours for fever.  Call your doctor for a follow-up appointment.  Pick up the prescription at the pharmacy, tomorrow and start taking the antibiotic

## 2020-08-18 NOTE — ED Provider Notes (Addendum)
[facility] EMERGENCY DEPARTMENT Provider Note   CSN: [id number] Arrival date & time: 08/18/20  0802     History Chief Complaint  Patient presents with   Abdominal Pain   Fever    Karina [name] is a 65 y.o. female.  HPI She presents for evaluation of ongoing fever for 1 week associated with decreased appetite, intermittent episodes of vomiting and diarrhea.  She states she is taking Tylenol, last dose, yesterday.  She was evaluated in the ED several days ago with a headache.  At that time comprehensive evaluation was reassuring and she was discharged with symptomatic treatment.  She has a history of multiple medical problems.  She did not take her morning medicines.  She presents by EMS for evaluation.  She says she lives with her husband at home.  She complains of headache and abdominal pain at this time.  She denies neck pain or back pain.  She has had some burning with urination, but no hematuria or urinary frequency.  She denies blood in her bowel movements.  There are no other known active modifying factors.    Past Medical History:  Diagnosis Date   Allergic rhinitis    Allergy    Anxiety    [place], no per pt   Cataract    Colon polyps 08/2008   Adenomatous   COPD (chronic obstructive pulmonary disease) ([place])    Depression    no per pt   Diabetes mellitus    Gastritis 08/2008   H. pylori   GERD (gastroesophageal reflux disease)    History of shingles    Left V1   Hypercholesteremia    Lung nodule    Menopausal disorder    Multiple sclerosis (HCC)    Neuromuscular disorder (HCC)    Multiple sclerosis   Plantar fasciitis    Seizure disorder ([place])    single seizure/hypoglycemia   Seizures ([place])    1 seizure in 2001 due to blood sugar dropping to 38, none since   Skin lesion 04/05/2017   Urinary incontinence    Vertigo     Patient Active Problem List   Diagnosis Date Noted   Joint pain in both hands 10/12/2019   Wrist  swelling 10/12/2019   Productive cough 08/22/2019   Head pain 08/22/2019   Elevated serum creatinine 04/24/2019   Frequent urination 04/20/2019   Localized swelling, mass and lump, neck 04/20/2019   Decreased appetite 04/20/2019   Screening mammogram, encounter for 04/20/2019   Weight loss 02/12/2019   Rib pain 05/05/2018   Seborrheic keratosis, inflamed 12/15/2017   B12 deficiency 08/24/2017   Left low back pain 08/24/2017   Erosive osteoarthritis of both hands 09/09/2016   Fatigue 09/07/2016   Bilateral hand pain 09/07/2016   Routine general medical examination at a health care facility 05/06/2015   Large breasts 05/06/2015   Lumbar disc disease 11/01/2014   Knee pain, bilateral 03/11/2014   Thoracic back pain 11/30/2013   Encounter for routine gynecological examination 01/31/2013   Encounter for Medicare annual wellness exam 01/23/2013   Chest pain 09/20/2012   SOB (shortness of breath) 09/20/2012   Dyspnea 09/13/2012   Asthmatic bronchitis , chronic (HCC) 12/06/2011   Chronic cough 11/22/2011   Anxiety disorder 07/17/2009   HEMORRHOIDS-EXTERNAL 11/07/2008   IRRITABLE BOWEL SYNDROME 11/07/2008   Lung nodule 10/14/2008   Prediabetes 10/14/2008   PERSONAL HX COLONIC POLYPS 10/08/2008   Vitamin D deficiency 07/23/2008   SYNCOPE, HX OF 06/01/2008  FOOT SURGERY, HX OF 06/01/2008   DILATION AND CURETTAGE, HX OF 06/01/2008   OSTEOARTHRITIS, SHOULDER, RIGHT 03/22/2008   ROTATOR CUFF SYNDROME, RIGHT 03/22/2008   ARTHRALGIA 12/12/2007   Allergic rhinitis 08/01/2007   Smoker 10/17/2006   Peripheral neuropathy 10/17/2006   FOOT PAIN, BILATERAL 10/17/2006   HYPOGLYCEMIA 09/30/2006   HYPERCHOLESTEROLEMIA 09/30/2006   MULTIPLE SCLEROSIS 09/30/2006   GERD 09/30/2006   FIBROCYSTIC BREAST DISEASE 09/30/2006    Past Surgical History:  Procedure Laterality Date   CERVICAL DISCECTOMY  2004   x 2, Anterior; Fusion C4-5, C5-6, C6-7, Dr. Kary Kos   CHEST CT  11/2008   With small  4 mm nodule L lung base (rec re check in 1 year)   CHEST CT  07/2009   Re check chest CT - lung nodule stable   CHOLECYSTECTOMY     COLONOSCOPY  08/2008   Polyps/ re check 5 yrs   CT SINUS LTD W/O CM  02/2009   negative   DILATION AND CURETTAGE OF UTERUS     ESOPHAGOGASTRODUODENOSCOPY  08/2008   Erosive gastritis, h pylori (treated)   FOOT SURGERY     left plantar fascial problem   ROTATOR CUFF REPAIR     right   TONSILLECTOMY     TUBAL LIGATION     UPPER GASTROINTESTINAL ENDOSCOPY       OB History   No obstetric history on file.     Family History  Problem Relation Age of Onset   Migraines Mother    Heart attack Mother    Coronary artery disease Mother    Coronary artery disease Father        6 bypasses    Stroke Father    Diabetes Father    Renal Disease Father    Colon cancer Maternal Grandfather 2   Coronary artery disease Cousin    Coronary artery disease Paternal Grandmother    Lung cancer Paternal Aunt    Multiple sclerosis Neg Hx    Esophageal cancer Neg Hx    Rectal cancer Neg Hx    Stomach cancer Neg Hx    Breast cancer Neg Hx     Social History   Tobacco Use   Smoking status: Former    Packs/day: 0.50    Years: 30.00    Pack years: 15.00    Types: Cigarettes    Quit date: 06/17/2019    Years since quitting: 1.1   Smokeless tobacco: Never  Substance Use Topics   Alcohol use: No    Alcohol/week: 0.0 standard drinks   Drug use: No    Home Medications Prior to Admission medications   Medication Sig Start Date End Date Taking? Authorizing Provider  amoxicillin-clavulanate (AUGMENTIN) 875-125 MG tablet Take 1 tablet by mouth 2 (two) times daily. One po bid x 7 days 08/18/20  Yes Daleen Bo, MD  albuterol (VENTOLIN HFA) 108 (90 Base) MCG/ACT inhaler INHALE 2 PUFFS UP TO EVERY 4 HOURS AS NEEDED FOR WHEEZING Patient taking differently: Inhale 1-2 puffs into the lungs every 4 (four) hours as needed for wheezing. 09/10/16   [name], Wynelle Fanny, MD   ALPRAZolam Duanne Moron) 0.5 MG tablet Take 1 tablet (0.5 mg total) by mouth 2 (two) times daily as needed. For anxiety. 02/11/15   [name], Wynelle Fanny, MD  azelastine (ASTELIN) 0.1 % nasal spray Place 2 sprays into both nostrils 2 (two) times daily as needed. 09/20/14   [provider]  Blood Glucose Monitoring Suppl (ACCU-CHEK AVIVA PLUS) w/Device KIT  Check blood sugar once daily and as directed Dx R73.09 08/19/15   [name], Wynelle Fanny, MD  budesonide-formoterol [facility]) 160-4.5 MCG/ACT inhaler Inhale 2 puffs into the lungs 2 (two) times daily. Patient taking differently: Inhale 2 puffs into the lungs 2 (two) times daily as needed (shortness of breath). 08/19/15   [name], Wynelle Fanny, MD  Diclofenac Sodium 3 % GEL Place 1 application onto the skin every 12 (twelve) hours as needed. Patient taking differently: Place 1 application onto the skin every 12 (twelve) hours as needed (rash). 06/18/16   Schaevitz, Randall An, MD  gabapentin (NEURONTIN) 300 MG capsule TAKE 1 CAPSULE BY MOUTH TWICE DURING THE DAY AND 3 CAPSULES BY MOUTH AT NIGHT Patient taking differently: Take 300-900 mg by mouth See admin instructions. Take 1 capsule twice during the day (morning and afternoon) and 3 capsule at night 04/14/20   Millikan, Jinny Blossom, NP  ipratropium-albuterol (DUONEB) 0.5-2.5 (3) MG/3ML SOLN TAKE 3 MLS BY NEBULIZATION EVERY 6 (SIX) HOURS AS NEEDED. Patient taking differently: Take 3 mLs by nebulization every 6 (six) hours as needed (shortness of breath). 09/05/18   Laverle Hobby, MD  ondansetron (ZOFRAN ODT) 4 MG disintegrating tablet Take 1 tablet (4 mg total) by mouth every 8 (eight) hours as needed for nausea or vomiting. 08/15/20   Loni Beckwith, PA-C  pantoprazole (PROTONIX) 40 MG tablet Take 40 mg by mouth 2 (two) times daily. 09/05/19   [provider]  tiZANidine (ZANAFLEX) 4 MG capsule Take 1 capsule (4 mg total) by mouth 3 (three) times daily as needed for muscle spasms. 08/13/20   [name], Wynelle Fanny,  MD    Allergies    Percocet [oxycodone-acetaminophen], Tecfidera [dimethyl fumarate], Amitriptyline hcl, Atorvastatin, Betaseron [interferon beta-1b], Clarithromycin, Duloxetine, Pregabalin, Pseudoephedrine, Zoloft [sertraline hcl], and Levofloxacin  Review of Systems   Review of Systems  All other systems reviewed and are negative.  Physical Exam Updated Vital Signs BP (!) 149/78   Pulse 72   Temp 98.6 F (37 C) (Oral)   Resp (!) 29   Ht $R'5\' 1"'VX$  (1.549 m)   Wt 87.2 kg   SpO2 95%   BMI 36.34 kg/m   Physical Exam Vitals and nursing note reviewed.  Constitutional:      General: She is in acute distress.     Appearance: She is well-developed. She is ill-appearing. She is not toxic-appearing or diaphoretic.  HENT:     Head: Normocephalic and atraumatic.     Mouth/Throat:     Mouth: Mucous membranes are dry.     Pharynx: No pharyngeal swelling or oropharyngeal exudate.  Eyes:     Conjunctiva/sclera: Conjunctivae normal.     Pupils: Pupils are equal, round, and reactive to light.  Neck:     Trachea: Phonation normal.  Cardiovascular:     Rate and Rhythm: Normal rate and regular rhythm.  Pulmonary:     Effort: Pulmonary effort is normal.     Breath sounds: No stridor. Wheezing present.  Chest:     Chest wall: No tenderness.  Abdominal:     General: There is no distension.     Palpations: Abdomen is soft. There is no mass.     Tenderness: There is no abdominal tenderness. There is no guarding.  Musculoskeletal:        General: Normal range of motion.     Cervical back: Normal range of motion and neck supple.  Skin:    General: Skin is warm and dry.     Coloration:  Skin is not jaundiced or pale.  Neurological:     Mental Status: She is alert and oriented to person, place, and time.     Motor: No abnormal muscle tone.  Psychiatric:        Mood and Affect: Mood normal.        Behavior: Behavior normal.        Thought Content: Thought content normal.        Judgment:  Judgment normal.    ED Results / Procedures / Treatments   Labs (all labs ordered are listed, but only abnormal results are displayed) Labs Reviewed  COMPREHENSIVE METABOLIC PANEL - Abnormal; Notable for the following components:      Result Value   Sodium 131 (*)    Potassium 2.9 (*)    Chloride 93 (*)    Glucose, Bld 113 (*)    Calcium 8.5 (*)    Albumin 2.4 (*)    All other components within normal limits  CBC - Abnormal; Notable for the following components:   WBC 11.7 (*)    RBC 3.84 (*)    HCT 35.8 (*)    All other components within normal limits  URINALYSIS, ROUTINE W REFLEX MICROSCOPIC - Abnormal; Notable for the following components:   Color, Urine STRAW (*)    Specific Gravity, Urine 1.004 (*)    Hgb urine dipstick SMALL (*)    Ketones, ur 5 (*)    Bacteria, UA RARE (*)    All other components within normal limits  RESP PANEL BY RT-PCR (FLU A&B, COVID) ARPGX2  CULTURE, BLOOD (ROUTINE X 2)  CULTURE, BLOOD (ROUTINE X 2)  URINE CULTURE  GASTROINTESTINAL PANEL BY PCR, STOOL (REPLACES STOOL CULTURE)  LIPASE, BLOOD  LACTIC ACID, PLASMA  PROTIME-INR  APTT    EKG EKG Interpretation  Date/Time:  Monday August 18 2020 08:19:57 EDT Ventricular Rate:  90 PR Interval:  178 QRS Duration: 87 QT Interval:  349 QTC Calculation: 427 R Axis:   80 Text Interpretation: Sinus tachycardia Atrial premature complexes Minimal ST depression, inferior leads Since last tracing rate faster Otherwise no significant change Confirmed by Daleen Bo [phone]) on 08/18/2020 9:01:27 AM  Radiology DG Chest Port 1 View  Result Date: 08/18/2020 CLINICAL DATA:  Questionable sepsis. Evaluate for abnormality. Sepsis, cough, shortness of breath, fever. EXAM: PORTABLE CHEST 1 VIEW COMPARISON:  Prior chest radiographs 08/15/2020 and earlier. FINDINGS: Heart size within normal limits. Right lower lobe airspace opacity compatible with pneumonia. The left lung is clear. No evidence of pleural effusion  or pneumothorax. No acute bony abnormality identified. Partially imaged ACDF hardware. IMPRESSION: Right lower lobe opacity compatible with pneumonia. Followup PA and lateral chest X-ray recommended in 3-4 weeks following trial of antibiotic therapy to ensure resolution. Electronically Signed   By: Kellie Simmering DO   On: 08/18/2020 08:54    Procedures .Critical Care  Date/Time: 08/18/2020 5:47 PM Performed by: Daleen Bo, MD Authorized by: Daleen Bo, MD   Critical care provider statement:    Critical care time (minutes):  35   Critical care start time:  08/18/2020 8:29 AM   Critical care end time:  08/18/2020 4:00 PM   Critical care time was exclusive of:  Separately billable procedures and treating other patients   Critical care was necessary to treat or prevent imminent or life-threatening deterioration of the following conditions:  Sepsis   Critical care was time spent personally by me on the following activities:  Blood draw for specimens, development of  treatment plan with patient or surrogate, discussions with consultants, evaluation of patient's response to treatment, examination of patient, obtaining history from patient or surrogate, ordering and performing treatments and interventions, ordering and review of laboratory studies, pulse oximetry, re-evaluation of patient's condition, review of old charts and ordering and review of radiographic studies   Medications Ordered in ED Medications  lactated ringers infusion ( Intravenous New Bag/Given 08/18/20 1355)  cefTRIAXone (ROCEPHIN) 2 g in sodium chloride 0.9 % 100 mL IVPB (0 g Intravenous Stopped 08/18/20 1030)  azithromycin (ZITHROMAX) 500 mg in sodium chloride 0.9 % 250 mL IVPB (0 mg Intravenous Stopped 08/18/20 1235)  acetaminophen (TYLENOL) tablet 1,000 mg (1,000 mg Oral Given 08/18/20 0859)  lactated ringers bolus 1,000 mL (0 mLs Intravenous Stopped 08/18/20 1010)    And  lactated ringers bolus 1,000 mL (0 mLs Intravenous  Stopped 08/18/20 1200)    And  lactated ringers bolus 1,000 mL (0 mLs Intravenous Stopped 08/18/20 1354)    ED Course  I have reviewed the triage vital signs and the nursing notes.  Pertinent labs & imaging results that were available during my care of the patient were reviewed by me and considered in my medical decision making (see chart for details).    MDM Rules/Calculators/A&P                           Patient Vitals for the past 24 hrs:  BP Temp Temp src Pulse Resp SpO2 Height Weight  08/18/20 1600 (!) 149/78 -- -- 72 (!) 29 95 % -- --  08/18/20 1545 -- -- -- 70 (!) 28 95 % -- --  08/18/20 1530 -- -- -- 67 (!) 30 96 % -- --  08/18/20 1515 -- -- -- 70 (!) 24 95 % -- --  08/18/20 1500 136/74 -- -- 69 (!) 28 93 % -- --  08/18/20 1445 -- -- -- 67 (!) 21 96 % -- --  08/18/20 1430 -- -- -- 67 17 96 % -- --  08/18/20 1415 -- -- -- 71 (!) 24 96 % -- --  08/18/20 1400 114/69 -- -- 69 (!) 26 96 % -- --  08/18/20 1355 108/72 -- -- 70 20 96 % -- --  08/18/20 1345 -- -- -- 66 (!) 25 95 % -- --  08/18/20 1330 -- -- -- 80 (!) 25 97 % -- --  08/18/20 1315 -- -- -- 66 20 95 % -- --  08/18/20 1300 108/72 -- -- 69 (!) 23 95 % -- --  08/18/20 1245 -- -- -- 73 (!) 23 95 % -- --  08/18/20 1235 110/72 98.6 F (37 C) Oral 72 11 95 % -- --  08/18/20 1230 -- -- -- 79 (!) 27 94 % -- --  08/18/20 1215 -- -- -- 73 (!) 22 94 % -- --  08/18/20 1200 110/72 -- -- 74 (!) 23 93 % -- --  08/18/20 1119 111/70 -- -- 77 18 95 % -- --  08/18/20 1000 107/79 -- -- 83 (!) 21 95 % -- --  08/18/20 0946 -- 100.2 F (37.9 C) Oral -- -- -- -- --  08/18/20 0900 102/71 -- -- 90 (!) 22 95 % -- --  08/18/20 0832 130/80 -- -- 94 (!) 31 93 % -- --  08/18/20 0830 -- -- -- -- -- 93 % -- --  08/18/20 0816 -- -- -- -- -- -- 5\' 1"  (1.549 m) 87.2 kg  08/18/20 0815 (!) 123/91 (!) 103 F (39.4 C) -- 94 (!) 23 94 % -- --    4:27 PM Reevaluation with update and discussion. After initial assessment and treatment, an updated  evaluation reveals she states she is feeling warm again.  She states she has to urinate now.  Findings discussed with the patient and all questions were answered. Daleen Bo   Medical Decision Making:  This patient is presenting for evaluation of she presents for evaluation of fever, vomiting or diarrhea, which does require a range of treatment options, and is a complaint that involves a high risk of morbidity and mortality. The differential diagnoses include sepsis, viral illness, bacterial infection. I decided to review old records, and in summary elderly female presenting for ongoing illness despite prior evaluation in the ED.  Chronically ill patient with history of COPD, depression, diabetes, multiple sclerosis and unspecified neuromuscular disorder.  She has history of chronic and recurrent headaches.  I did not require additional historical information from anyone.  Clinical Laboratory Tests Ordered, included  sepsis bundle . Review indicates normal except white count high, sodium low, potassium normal, chloride low, glucose high urine abnormal, nonspecific. Radiologic Tests Ordered, included chest x-ray.  I independently Visualized: Radiograph images, which show right lower lobe infiltrate  Cardiac Monitor Tracing which shows sinus rhythm with sinus arrhythmia    Critical Interventions-clinical evaluation, laboratory testing, empiric antibiotics, IV fluid bolus, observation and reevaluation  After These Interventions, the Patient was reevaluated and was found with uncomplicated pneumonia.  Empiric antibiotics begun.  Vital signs stable.  Lactate normal.  Screening evaluation is reassuring.  No evidence for urinalysis.  Blood cultures pending.  Viral panel negative.  CRITICAL CARE-yes Performed by: Daleen Bo  Nursing Notes Reviewed/ Care Coordinated Applicable Imaging Reviewed Interpretation of Laboratory Data incorporated into ED treatment  The patient appears reasonably  screened and/or stabilized for discharge and I doubt any other medical condition or other [facility] requiring further screening, evaluation, or treatment in the ED at this time prior to discharge.  Plan: Home Medications- Continue usual medicines; Home Treatments- Gradual advance diet and activity; return here if the recommended treatment, does not improve the symptoms; Recommended follow up- Follow-up PCP 1 week and sooner if needed     Final Clinical Impression(s) / ED Diagnoses Final diagnoses:  Community acquired pneumonia of right lower lobe of lung  Abdominal pain, unspecified abdominal location    Rx / DC Orders ED Discharge Orders          Ordered    amoxicillin-clavulanate (AUGMENTIN) 875-125 MG tablet  2 times daily        08/18/20 1631             Daleen Bo, MD 08/18/20 3428    Daleen Bo, MD 08/18/20 1757

## 2020-08-19 LAB — URINE CULTURE

## 2020-08-23 LAB — CULTURE, BLOOD (ROUTINE X 2)
Culture: NO GROWTH
Culture: NO GROWTH
Special Requests: ADEQUATE

## 2020-08-25 ENCOUNTER — Telehealth: Payer: Self-pay | Admitting: Family Medicine

## 2020-08-25 NOTE — Telephone Encounter (Signed)
Once she finishes then she is done  Please schedule f/u this week or early next week for this  Let me know if any sob or cough/worse cough or fever

## 2020-08-25 NOTE — Telephone Encounter (Signed)
Karina [name] called in due to she went to the hospital for pneumonia and she is out of the medication and wanted to know if she could get a refill on the medication.

## 2020-08-27 ENCOUNTER — Encounter: Payer: Self-pay | Admitting: Family Medicine

## 2020-08-27 ENCOUNTER — Ambulatory Visit (INDEPENDENT_AMBULATORY_CARE_PROVIDER_SITE_OTHER): Payer: Medicare HMO | Admitting: Family Medicine

## 2020-08-27 ENCOUNTER — Other Ambulatory Visit: Payer: Self-pay

## 2020-08-27 VITALS — BP 128/72 | HR 84 | Temp 97.8°F | Ht 61.0 in | Wt 184.0 lb

## 2020-08-27 DIAGNOSIS — E7800 Pure hypercholesterolemia, unspecified: Secondary | ICD-10-CM | POA: Diagnosis not present

## 2020-08-27 DIAGNOSIS — E876 Hypokalemia: Secondary | ICD-10-CM

## 2020-08-27 DIAGNOSIS — J189 Pneumonia, unspecified organism: Secondary | ICD-10-CM

## 2020-08-27 LAB — CBC WITH DIFFERENTIAL/PLATELET
Basophils Absolute: 0.1 10*3/uL (ref 0.0–0.1)
Basophils Relative: 0.9 % (ref 0.0–3.0)
Eosinophils Absolute: 0.2 10*3/uL (ref 0.0–0.7)
Eosinophils Relative: 2.9 % (ref 0.0–5.0)
HCT: 36.9 % (ref 36.0–46.0)
Hemoglobin: 12.4 g/dL (ref 12.0–15.0)
Lymphocytes Relative: 27.9 % (ref 12.0–46.0)
Lymphs Abs: 2.1 10*3/uL (ref 0.7–4.0)
MCHC: 33.7 g/dL (ref 30.0–36.0)
MCV: 93.3 fl (ref 78.0–100.0)
Monocytes Absolute: 0.6 10*3/uL (ref 0.1–1.0)
Monocytes Relative: 8.2 % (ref 3.0–12.0)
Neutro Abs: 4.6 10*3/uL (ref 1.4–7.7)
Neutrophils Relative %: 60.1 % (ref 43.0–77.0)
Platelets: 361 10*3/uL (ref 150.0–400.0)
RBC: 3.96 Mil/uL (ref 3.87–5.11)
RDW: 13.8 % (ref 11.5–15.5)
WBC: 7.7 10*3/uL (ref 4.0–10.5)

## 2020-08-27 LAB — BASIC METABOLIC PANEL
BUN: 8 mg/dL (ref 6–23)
CO2: 37 mEq/L — ABNORMAL HIGH (ref 19–32)
Calcium: 9.5 mg/dL (ref 8.4–10.5)
Chloride: 97 mEq/L (ref 96–112)
Creatinine, Ser: 0.92 mg/dL (ref 0.40–1.20)
GFR: 66.69 mL/min (ref >60.00)
Glucose, Bld: 85 mg/dL (ref 70–99)
Potassium: 3.2 mEq/L — ABNORMAL LOW (ref 3.5–5.1)
Sodium: 140 mEq/L (ref 135–145)

## 2020-08-27 LAB — LIPID PANEL
Cholesterol: 198 mg/dL (ref 0–200)
HDL: 54 mg/dL (ref >39.00)
LDL Cholesterol: 119 mg/dL — ABNORMAL HIGH (ref 0–99)
NonHDL: 143.89
Total CHOL/HDL Ratio: 4
Triglycerides: 123 mg/dL (ref 0.0–149.0)
VLDL: 24.6 mg/dL (ref 0.0–40.0)

## 2020-08-27 MED ORDER — ALBUTEROL SULFATE HFA 108 (90 BASE) MCG/ACT IN AERS: INHALATION_SPRAY | RESPIRATORY_TRACT | 3 refills | Status: DC

## 2020-08-27 MED ORDER — PREDNISONE 10 MG PO TABS: ORAL_TABLET | ORAL | 0 refills | Status: DC

## 2020-08-27 NOTE — Progress Notes (Signed)
Subjective:    Patient ID: Karina [name], female    DOB: [birth year], 65 y.o.   MRN: [id number]  This visit occurred during the SARS-CoV-2 public health emergency.  Safety protocols were in place, including screening questions prior to the visit, additional usage of staff PPE, and extensive cleaning of exam room while observing appropriate contact time as indicated for disinfecting solutions.   HPI Pt presents for f/u of pneumonia and hyperlipidemia   Wt Readings from Last 3 Encounters:  08/27/20 184 lb (83.5 kg)  08/18/20 192 lb 5 oz (87.2 kg)  08/11/20 192 lb 5 oz (87.2 kg)   34.77 kg/m  Pulse ox is 92% on RA today  Former smoker   Pt was seen on 6/13 in ER for fever and some n/diarrhea     Lab Results  Component Value Date   CREATININE 1.00 08/18/2020   BUN 9 08/18/2020   NA 131 (L) 08/18/2020   K 2.9 (L) 08/18/2020   CL 93 (L) 08/18/2020   CO2 25 08/18/2020   Lab Results  Component Value Date   WBC 11.7 (H) 08/18/2020   HGB 12.1 08/18/2020   HCT 35.8 (L) 08/18/2020   MCV 93.2 08/18/2020   PLT 283 08/18/2020   Ua with small hgband rare bacteria Urine cx showed multiple species /? [place]    DG Chest Port 1 View  Result Date: 08/18/2020 CLINICAL DATA:  Questionable sepsis. Evaluate for abnormality. Sepsis, cough, shortness of breath, fever. EXAM: PORTABLE CHEST 1 VIEW COMPARISON:  Prior chest radiographs 08/15/2020 and earlier. FINDINGS: Heart size within normal limits. Right lower lobe airspace opacity compatible with pneumonia. The left lung is clear. No evidence of pleural effusion or pneumothorax. No acute bony abnormality identified. Partially imaged ACDF hardware. IMPRESSION: Right lower lobe opacity compatible with pneumonia. Followup PA and lateral chest X-ray recommended in 3-4 weeks following trial of antibiotic therapy to ensure resolution. Electronically Signed   By: Kellie Simmering DO   On: 08/18/2020 08:54   DG Chest Portable 1 View  Result Date:  08/15/2020 CLINICAL DATA:  Cough. EXAM: PORTABLE CHEST 1 VIEW COMPARISON:  None. FINDINGS: The heart size and mediastinal contours are within normal limits. Both lungs are clear. The visualized skeletal structures are unremarkable. IMPRESSION: No active disease. Electronically Signed   By: Marijo Conception M.D.   On: 08/15/2020 08:51     From note: med dec making This patient is presenting for evaluation of she presents for evaluation of fever, vomiting or diarrhea, which does require a range of treatment options, and is a complaint that involves a high risk of morbidity and mortality. The differential diagnoses include sepsis, viral illness, bacterial infection. I decided to review old records, and in summary elderly female presenting for ongoing illness despite prior evaluation in the ED.  Chronically ill patient with history of COPD, depression, diabetes, multiple sclerosis and unspecified neuromuscular disorder.  She has history of chronic and recurrent headaches. I did not require additional historical information from anyone.   Clinical Laboratory Tests Ordered, included  sepsis bundle . Review indicates normal except white count high, sodium low, potassium normal, chloride low, glucose high urine abnormal, nonspecific. Radiologic Tests Ordered, included chest x-ray. I independently Visualized: Radiograph images, which show right lower lobe infiltrate  She was tx with azithromycin and rocephin as well as LR fluids    Cardiac Monitor Tracing which shows sinus rhythm with sinus arrhythmia     Critical Interventions-clinical evaluation, laboratory testing, empiric antibiotics,  IV fluid bolus, observation and reevaluation   After These Interventions, the Patient was reevaluated and was found with uncomplicated pneumonia.  Empiric antibiotics begun.  Vital signs stable.  Lactate normal.  Screening evaluation is reassuring.  No evidence for urinalysis.  Blood cultures pending.  Viral panel  negative.  Blood cultures were negative   She was px augmentin bid    Pt notes she feels some better  Also her head pain/neck pain is much better (she thinks abx helped)   Still weak/legs are weak   Still some diarrhea-pepto is helping   Coughs up white foamy sputum  Coughs- heavy feeling but dry cough  No sinus drainage  No fever   Wants lipid today  Lab Results  Component Value Date   CHOL 266 (H) 09/01/2016   HDL 62.20 09/01/2016   LDLCALC 178 (H) 09/01/2016   LDLDIRECT 206.5 01/24/2013   TRIG 130.0 09/01/2016   CHOLHDL 4 09/01/2016    Patient Active Problem List   Diagnosis Date Noted   CAP (community acquired pneumonia) 08/27/2020   Hypokalemia 08/27/2020   Joint pain in both hands 10/12/2019   Wrist swelling 10/12/2019   Productive cough 08/22/2019   Head pain 08/22/2019   Elevated serum creatinine 04/24/2019   Frequent urination 04/20/2019   Localized swelling, mass and lump, neck 04/20/2019   Decreased appetite 04/20/2019   Screening mammogram, encounter for 04/20/2019   Weight loss 02/12/2019   Rib pain 05/05/2018   Seborrheic keratosis, inflamed 12/15/2017   B12 deficiency 08/24/2017   Left low back pain 08/24/2017   Erosive osteoarthritis of both hands 09/09/2016   Fatigue 09/07/2016   Bilateral hand pain 09/07/2016   Routine general medical examination at a health care facility 05/06/2015   Large breasts 05/06/2015   Lumbar disc disease 11/01/2014   Knee pain, bilateral 03/11/2014   Thoracic back pain 11/30/2013   Encounter for routine gynecological examination 01/31/2013   Encounter for Medicare annual wellness exam 01/23/2013   Chest pain 09/20/2012   SOB (shortness of breath) 09/20/2012   Dyspnea 09/13/2012   Asthmatic bronchitis , chronic (HCC) 12/06/2011   Chronic cough 11/22/2011   Anxiety disorder 07/17/2009   HEMORRHOIDS-EXTERNAL 11/07/2008   IRRITABLE BOWEL SYNDROME 11/07/2008   Lung nodule 10/14/2008   Prediabetes 10/14/2008    PERSONAL HX COLONIC POLYPS 10/08/2008   Vitamin D deficiency 07/23/2008   SYNCOPE, HX OF 06/01/2008   FOOT SURGERY, HX OF 06/01/2008   DILATION AND CURETTAGE, HX OF 06/01/2008   OSTEOARTHRITIS, SHOULDER, RIGHT 03/22/2008   ROTATOR CUFF SYNDROME, RIGHT 03/22/2008   ARTHRALGIA 12/12/2007   Allergic rhinitis 08/01/2007   Smoker 10/17/2006   Peripheral neuropathy 10/17/2006   FOOT PAIN, BILATERAL 10/17/2006   HYPOGLYCEMIA 09/30/2006   HYPERCHOLESTEROLEMIA 09/30/2006   MULTIPLE SCLEROSIS 09/30/2006   GERD 09/30/2006   FIBROCYSTIC BREAST DISEASE 09/30/2006   Past Medical History:  Diagnosis Date   Allergic rhinitis    Allergy    Anxiety    [place] [facility] [place], no per pt   Cataract    Colon polyps 08/2008   Adenomatous   COPD (chronic obstructive pulmonary disease) ([place])    Depression    no per pt   Diabetes mellitus    Gastritis 08/2008   H. pylori   GERD (gastroesophageal reflux disease)    History of shingles    Left V1   Hypercholesteremia    Lung nodule    Menopausal disorder    Multiple sclerosis (HCC)    Neuromuscular disorder (  [place])    Multiple sclerosis   Plantar fasciitis    Seizure disorder ([place])    single seizure/hypoglycemia   Seizures ([place])    1 seizure in 2001 due to blood sugar dropping to 38, none since   Skin lesion 04/05/2017   Urinary incontinence    Vertigo    Past Surgical History:  Procedure Laterality Date   CERVICAL DISCECTOMY  2004   x 2, Anterior; Fusion C4-5, C5-6, C6-7, Dr. Kary Kos   CHEST CT  11/2008   With small 4 mm nodule L lung base (rec re check in 1 year)   CHEST CT  07/2009   Re check chest CT - lung nodule stable   CHOLECYSTECTOMY     COLONOSCOPY  08/2008   Polyps/ re check 5 yrs   CT SINUS LTD W/O CM  02/2009   negative   DILATION AND CURETTAGE OF UTERUS     ESOPHAGOGASTRODUODENOSCOPY  08/2008   Erosive gastritis, h pylori (treated)   FOOT SURGERY     left plantar fascial problem   ROTATOR CUFF REPAIR     right    TONSILLECTOMY     TUBAL LIGATION     UPPER GASTROINTESTINAL ENDOSCOPY     Social History   Tobacco Use   Smoking status: Former    Packs/day: 0.50    Years: 30.00    Pack years: 15.00    Types: Cigarettes    Quit date: 06/17/2019    Years since quitting: 1.1   Smokeless tobacco: Never  Substance Use Topics   Alcohol use: No    Alcohol/week: 0.0 standard drinks   Drug use: No   Family History  Problem Relation Age of Onset   Migraines Mother    Heart attack Mother    Coronary artery disease Mother    Coronary artery disease Father        6 bypasses    Stroke Father    Diabetes Father    Renal Disease Father    Colon cancer Maternal Grandfather 43   Coronary artery disease Cousin    Coronary artery disease Paternal Grandmother    Lung cancer Paternal Aunt    Multiple sclerosis Neg Hx    Esophageal cancer Neg Hx    Rectal cancer Neg Hx    Stomach cancer Neg Hx    Breast cancer Neg Hx    Allergies  Allergen Reactions   Percocet [Oxycodone-Acetaminophen] Shortness Of Breath    Felt like going to pass out and sweating   Tecfidera [Dimethyl Fumarate] Shortness Of Breath, Palpitations, Rash and Cough   Amitriptyline Hcl Itching and Swelling   Atorvastatin Other (See Comments)    elevated LFT's   Betaseron [Interferon Beta-1b] Other (See Comments)    Headache   Clarithromycin Other (See Comments)    reaction not known   Duloxetine Other (See Comments)    pain   Pregabalin Other (See Comments)    LE swelling   Pseudoephedrine Other (See Comments)    legs hurt   Zoloft [Sertraline Hcl] Other (See Comments)    Headaches    Levofloxacin Rash   Current Outpatient Medications on File Prior to Visit  Medication Sig Dispense Refill   ALPRAZolam (XANAX) 0.5 MG tablet Take 1 tablet (0.5 mg total) by mouth 2 (two) times daily as needed. For anxiety. 30 tablet 0   amoxicillin-clavulanate (AUGMENTIN) 875-125 MG tablet Take 1 tablet by mouth 2 (two) times daily. One po  bid x 7 days 14  tablet 0   azelastine (ASTELIN) 0.1 % nasal spray Place 2 sprays into both nostrils 2 (two) times daily as needed.  12   Blood Glucose Monitoring Suppl (ACCU-CHEK AVIVA PLUS) w/Device KIT Check blood sugar once daily and as directed Dx R73.09 1 kit 0   budesonide-formoterol (SYMBICORT) 160-4.5 MCG/ACT inhaler Inhale 2 puffs into the lungs 2 (two) times daily. (Patient taking differently: Inhale 2 puffs into the lungs 2 (two) times daily as needed (shortness of breath).) 3 Inhaler 2   Diclofenac Sodium 3 % GEL Place 1 application onto the skin every 12 (twelve) hours as needed. (Patient taking differently: Place 1 application onto the skin every 12 (twelve) hours as needed (rash).) 50 g 0   gabapentin (NEURONTIN) 300 MG capsule TAKE 1 CAPSULE BY MOUTH TWICE DURING THE DAY AND 3 CAPSULES BY MOUTH AT NIGHT (Patient taking differently: Take 300-900 mg by mouth See admin instructions. Take 1 capsule twice during the day (morning and afternoon) and 3 capsule at night) 450 capsule 3   ipratropium-albuterol (DUONEB) 0.5-2.5 (3) MG/3ML SOLN TAKE 3 MLS BY NEBULIZATION EVERY 6 (SIX) HOURS AS NEEDED. (Patient taking differently: Take 3 mLs by nebulization every 6 (six) hours as needed (shortness of breath).) 360 mL 0   ondansetron (ZOFRAN ODT) 4 MG disintegrating tablet Take 1 tablet (4 mg total) by mouth every 8 (eight) hours as needed for nausea or vomiting. 20 tablet 0   pantoprazole (PROTONIX) 40 MG tablet Take 40 mg by mouth 2 (two) times daily.     tiZANidine (ZANAFLEX) 4 MG capsule Take 1 capsule (4 mg total) by mouth 3 (three) times daily as needed for muscle spasms. 30 capsule 3   No current facility-administered medications on file prior to visit.    Review of Systems  Constitutional:  Positive for fatigue. Negative for activity change, appetite change, fever and unexpected weight change.  HENT:  Negative for congestion, ear pain, rhinorrhea, sinus pressure and sore throat.   Eyes:   Negative for pain, redness and visual disturbance.  Respiratory:  Positive for cough, shortness of breath and wheezing.   Cardiovascular:  Negative for chest pain and palpitations.  Gastrointestinal:  Positive for diarrhea. Negative for abdominal distention, abdominal pain, anal bleeding, blood in stool and constipation.  Endocrine: Negative for polydipsia and polyuria.  Genitourinary:  Negative for dysuria, frequency and urgency.  Musculoskeletal:  Negative for arthralgias, back pain and myalgias.  Skin:  Negative for pallor and rash.  Allergic/Immunologic: Negative for environmental allergies.  Neurological:  Negative for dizziness, syncope and headaches.  Hematological:  Negative for adenopathy. Does not bruise/bleed easily.  Psychiatric/Behavioral:  Negative for decreased concentration and dysphoric mood. The patient is not nervous/anxious.       Objective:   Physical Exam Constitutional:      General: She is not in acute distress.    Appearance: Normal appearance. She is well-developed. She is obese. She is not ill-appearing or diaphoretic.  HENT:     Head: Normocephalic and atraumatic.     Nose: No congestion.     Mouth/Throat:     Mouth: Mucous membranes are moist.     Pharynx: Oropharynx is clear.  Eyes:     General:        Right eye: No discharge.        Left eye: No discharge.     Conjunctiva/sclera: Conjunctivae normal.     Pupils: Pupils are equal, round, and reactive to light.  Neck:  Thyroid: No thyromegaly.     Vascular: No carotid bruit or JVD.  Cardiovascular:     Rate and Rhythm: Normal rate and regular rhythm.     Heart sounds: Normal heart sounds.    No gallop.  Pulmonary:     Effort: Pulmonary effort is normal. No respiratory distress.     Breath sounds: Normal breath sounds. No stridor. No wheezing, rhonchi or rales.     Comments: Diffusely distant bs Wheezes scattered worse in upper lung fields  No crackles or rales Chest:     Chest wall: No  tenderness.  Abdominal:     General: Bowel sounds are normal. There is no distension or abdominal bruit.     Palpations: Abdomen is soft. There is no mass.     Tenderness: There is no abdominal tenderness.  Musculoskeletal:     Cervical back: Normal range of motion and neck supple.     Right lower leg: No edema.     Left lower leg: No edema.  Lymphadenopathy:     Cervical: No cervical adenopathy.  Skin:    General: Skin is warm and dry.     Coloration: Skin is not pale.     Findings: No rash.  Neurological:     Mental Status: She is alert.     Coordination: Coordination normal.     Deep Tendon Reflexes: Reflexes are normal and symmetric. Reflexes normal.  Psychiatric:        Mood and Affect: Mood normal.          Assessment & Plan:   Problem List Items Addressed This Visit       Respiratory   CAP (community acquired pneumonia) - Primary    LLL pna diagnosed in ER by chest xray after presenting with fever  Reviewed hospital records, lab results and studies in detail  Clinical improvement today but still wheezing (in context of copd) Reassuring exam  40 mg prednisone taper px today and side eff discussed  Update if not starting to improve in a week or if worsening  Will f/u 1-2 wk for re check and cxr  ER parameters discussed        Relevant Medications   albuterol (VENTOLIN HFA) 108 (90 Base) MCG/ACT inhaler   Other Relevant Orders   Basic metabolic panel   CBC with Differential/Platelet     Other   HYPERCHOLESTEROLEMIA    Pt req check  No medication currently Disc goals for lipids and reasons to control them Rev last labs with pt Rev low sat fat diet in detail Diet is fair  Lab today  To disc further at f/u       Relevant Orders   Lipid panel   Hypokalemia    K low in ER with diarrhea at 2.9  Was given LR fluids  Re check today  Still some loose stool       Relevant Orders   Basic metabolic panel

## 2020-08-27 NOTE — Assessment & Plan Note (Signed)
LLL pna diagnosed in ER by chest xray after presenting with fever  Reviewed hospital records, lab results and studies in detail  Clinical improvement today but still wheezing (in context of copd) Reassuring exam  40 mg prednisone taper px today and side eff discussed  Update if not starting to improve in a week or if worsening  Will f/u 1-2 wk for re check and cxr  ER parameters discussed

## 2020-08-27 NOTE — Patient Instructions (Signed)
Keep drinking fluids  Rest  Take the prednisone as directed It may make you feel hyper and hungry   This should help with wheezing and breathing   Follow up with me in 1-2 weeks

## 2020-08-27 NOTE — Assessment & Plan Note (Signed)
K low in ER with diarrhea at 2.9  Was given LR fluids  Re check today  Still some loose stool

## 2020-08-27 NOTE — Assessment & Plan Note (Signed)
Pt req check  No medication currently Disc goals for lipids and reasons to control them Rev last labs with pt Rev low sat fat diet in detail Diet is fair  Lab today  To disc further at f/u

## 2020-08-28 ENCOUNTER — Telehealth: Payer: Self-pay | Admitting: *Deleted

## 2020-08-28 MED ORDER — POTASSIUM CHLORIDE CRYS ER 10 MEQ PO TBCR: 10.0000 meq | EXTENDED_RELEASE_TABLET | Freq: Two times a day (BID) | ORAL | 3 refills | Status: DC

## 2020-08-28 NOTE — Telephone Encounter (Signed)
Pt notified of lab results and Dr. [name]'s comments Rx sent to pharmacy  

## 2020-08-28 NOTE — Telephone Encounter (Signed)
-----   Message from Abner Greenspan, MD sent at 08/27/2020  9:23 PM EDT ----- Normal cbc Much improved cholesterol  K is 3.2 -improved but too low  Please send in K clor 10 meq and take 1 daily #30 3 ref  We will re check K at f/u

## 2020-09-05 ENCOUNTER — Other Ambulatory Visit: Payer: Self-pay | Admitting: Family Medicine

## 2020-09-10 ENCOUNTER — Ambulatory Visit (INDEPENDENT_AMBULATORY_CARE_PROVIDER_SITE_OTHER)
Admission: RE | Admit: 2020-09-10 | Discharge: 2020-09-10 | Disposition: A | Discharge status: Home / Self Care | Payer: Medicare HMO | Source: Ambulatory Visit | Attending: Family Medicine | Admitting: Family Medicine

## 2020-09-10 ENCOUNTER — Other Ambulatory Visit: Payer: Self-pay

## 2020-09-10 ENCOUNTER — Encounter: Payer: Self-pay | Admitting: Family Medicine

## 2020-09-10 ENCOUNTER — Ambulatory Visit (INDEPENDENT_AMBULATORY_CARE_PROVIDER_SITE_OTHER): Payer: Medicare HMO | Admitting: Family Medicine

## 2020-09-10 VITALS — BP 130/72 | HR 90 | Temp 96.9°F | Ht 61.0 in | Wt 185.4 lb

## 2020-09-10 DIAGNOSIS — R0602 Shortness of breath: Secondary | ICD-10-CM | POA: Diagnosis not present

## 2020-09-10 DIAGNOSIS — J449 Chronic obstructive pulmonary disease, unspecified: Secondary | ICD-10-CM

## 2020-09-10 DIAGNOSIS — J189 Pneumonia, unspecified organism: Secondary | ICD-10-CM

## 2020-09-10 DIAGNOSIS — E876 Hypokalemia: Secondary | ICD-10-CM | POA: Diagnosis not present

## 2020-09-10 LAB — BASIC METABOLIC PANEL
BUN: 14 mg/dL (ref 6–23)
CO2: 34 mEq/L — ABNORMAL HIGH (ref 19–32)
Calcium: 10.1 mg/dL (ref 8.4–10.5)
Chloride: 99 mEq/L (ref 96–112)
Creatinine, Ser: 1.26 mg/dL — ABNORMAL HIGH (ref 0.40–1.20)
GFR: 45.71 mL/min — ABNORMAL LOW (ref >60.00)
Glucose, Bld: 138 mg/dL — ABNORMAL HIGH (ref 70–99)
Potassium: 4.7 mEq/L (ref 3.5–5.1)
Sodium: 139 mEq/L (ref 135–145)

## 2020-09-10 MED ORDER — NYSTATIN 100000 UNIT/ML MT SUSP: OROMUCOSAL | 1 refills | Status: DC

## 2020-09-10 MED ORDER — BUDESONIDE-FORMOTEROL FUMARATE 160-4.5 MCG/ACT IN AERO: 2.0000 | INHALATION_SPRAY | Freq: Two times a day (BID) | RESPIRATORY_TRACT | 2 refills | Status: DC

## 2020-09-10 NOTE — Assessment & Plan Note (Signed)
Now taking klor con 10 meq daily  bmet ordered today

## 2020-09-10 NOTE — Assessment & Plan Note (Signed)
More sob since having pna (re check cxr today) Quit smoking in jan  Enc to start back on symbicort daily and update (rinse mouth and can use nystatin solun if thrush)

## 2020-09-10 NOTE — Assessment & Plan Note (Signed)
More problematic since pna  cxr today  Will get back on symbicort  Her pumonologist moved Consider cardiology attention if no imp (rev last myoview and echo)

## 2020-09-10 NOTE — Patient Instructions (Signed)
Use the symbicort twice daily  Rinse mouth very well after use  Use the nystatin solution if you get symptoms of thrush   Chest xray today   Depending on result and how you do -will consider cardiology visit   We will let you know   If symptoms worsen or change let us know   Re checking potassium today

## 2020-09-10 NOTE — Progress Notes (Signed)
Subjective:    Patient ID: Karina [name], female    DOB: [birth year], 65 y.o.   MRN: [id number]  This visit occurred during the SARS-CoV-2 public health emergency.  Safety protocols were in place, including screening questions prior to the visit, additional usage of staff PPE, and extensive cleaning of exam room while observing appropriate contact time as indicated for disinfecting solutions.   HPI Pt presents for f/u of pneumonia and repeat cxr   Wt Readings from Last 3 Encounters:  09/10/20 185 lb 7 oz (84.1 kg)  08/27/20 184 lb (83.5 kg)  08/18/20 192 lb 5 oz (87.2 kg)   35.04 kg/m  Pt had LLL pna dx in ER  Followed up here in June with clinical improvement  Px prednisoen taper for residual copd symptoms  Completed abx   Does not think the prednisone helped much  Not wheezing Sob with exertion  No energy  All since getting sick   02 96%  Not smoking -since January  Phlegm-not much /not thick  Does not like symbicort-makes her cough but can use it (also thrush) Uses nebulizer -helps   Last pulmonologist moved   2017 myo view  Echo- 2014 -good    CXR 08/18/20 DG Chest [place] 1 View (Accession [id number]) (Order [id number]) Imaging Date: 08/18/2020 Department: Early Released By/Authorizing: Daleen Bo, MD (auto-released)    Exam Status  Status  Final [99]   PACS Intelerad Image Link   Show images for [facility] 1 View  Study Result  Narrative & Impression  CLINICAL DATA:  Questionable sepsis. Evaluate for abnormality. Sepsis, cough, shortness of breath, fever.   EXAM: PORTABLE CHEST 1 VIEW   COMPARISON:  Prior chest radiographs 08/15/2020 and earlier.   FINDINGS: Heart size within normal limits. Right lower lobe airspace opacity compatible with pneumonia. The left lung is clear. No evidence of pleural effusion or pneumothorax. No acute bony abnormality identified. Partially imaged ACDF hardware.    IMPRESSION: Right lower lobe opacity compatible with pneumonia. Followup PA and lateral chest X-ray recommended in 3-4 weeks following trial of antibiotic therapy to ensure resolution.     Labs done last time  Lab Results  Component Value Date   CREATININE 0.92 08/27/2020   BUN 8 08/27/2020   NA 140 08/27/2020   K 3.2 (L) 08/27/2020   CL 97 08/27/2020   CO2 37 (H) 08/27/2020   We added klor con 10 meq daily  In ER prior with diarrhea was 2.9 and given some then   Cbc was improved Lab Results  Component Value Date   WBC 7.7 08/27/2020   HGB 12.4 08/27/2020   HCT 36.9 08/27/2020   MCV 93.3 08/27/2020   PLT 361.0 08/27/2020   Patient Active Problem List   Diagnosis Date Noted   CAP (community acquired pneumonia) 08/27/2020   Hypokalemia 08/27/2020   Joint pain in both hands 10/12/2019   Wrist swelling 10/12/2019   Productive cough 08/22/2019   Head pain 08/22/2019   Elevated serum creatinine 04/24/2019   Frequent urination 04/20/2019   Localized swelling, mass and lump, neck 04/20/2019   Decreased appetite 04/20/2019   Screening mammogram, encounter for 04/20/2019   Weight loss 02/12/2019   Rib pain 05/05/2018   Seborrheic keratosis, inflamed 12/15/2017   B12 deficiency 08/24/2017   Left low back pain 08/24/2017   Erosive osteoarthritis of both hands 09/09/2016   Fatigue 09/07/2016   Bilateral hand pain 09/07/2016   Routine general medical  examination at a health care facility 05/06/2015   Large breasts 05/06/2015   Lumbar disc disease 11/01/2014   Knee pain, bilateral 03/11/2014   Thoracic back pain 11/30/2013   Encounter for routine gynecological examination 01/31/2013   Encounter for Medicare annual wellness exam 01/23/2013   Chest pain 09/20/2012   SOB (shortness of breath) 09/20/2012   Dyspnea 09/13/2012   Asthmatic bronchitis , chronic (HCC) 12/06/2011   Chronic cough 11/22/2011   Anxiety disorder 07/17/2009   HEMORRHOIDS-EXTERNAL 11/07/2008    IRRITABLE BOWEL SYNDROME 11/07/2008   Lung nodule 10/14/2008   Prediabetes 10/14/2008   PERSONAL HX COLONIC POLYPS 10/08/2008   Vitamin D deficiency 07/23/2008   SYNCOPE, HX OF 06/01/2008   FOOT SURGERY, HX OF 06/01/2008   DILATION AND CURETTAGE, HX OF 06/01/2008   OSTEOARTHRITIS, SHOULDER, RIGHT 03/22/2008   ROTATOR CUFF SYNDROME, RIGHT 03/22/2008   ARTHRALGIA 12/12/2007   Allergic rhinitis 08/01/2007   Former smoker 10/17/2006   Peripheral neuropathy 10/17/2006   FOOT PAIN, BILATERAL 10/17/2006   HYPOGLYCEMIA 09/30/2006   HYPERCHOLESTEROLEMIA 09/30/2006   MULTIPLE SCLEROSIS 09/30/2006   GERD 09/30/2006   FIBROCYSTIC BREAST DISEASE 09/30/2006   Past Medical History:  Diagnosis Date   Allergic rhinitis    Allergy    Anxiety    Councelor- Pervis Hocking, no per pt   Cataract    Colon polyps 08/2008   Adenomatous   COPD (chronic obstructive pulmonary disease) ([place])    Depression    no per pt   Diabetes mellitus    Gastritis 08/2008   H. pylori   GERD (gastroesophageal reflux disease)    History of shingles    Left V1   Hypercholesteremia    Lung nodule    Menopausal disorder    Multiple sclerosis (HCC)    Neuromuscular disorder ([place])    Multiple sclerosis   Plantar fasciitis    Seizure disorder ([place])    single seizure/hypoglycemia   Seizures ([place])    1 seizure in 2001 due to blood sugar dropping to 38, none since   Skin lesion 04/05/2017   Urinary incontinence    Vertigo    Past Surgical History:  Procedure Laterality Date   CERVICAL DISCECTOMY  2004   x 2, Anterior; Fusion C4-5, C5-6, C6-7, Dr. Kary Kos   CHEST CT  11/2008   With small 4 mm nodule L lung base (rec re check in 1 year)   CHEST CT  07/2009   Re check chest CT - lung nodule stable   CHOLECYSTECTOMY     COLONOSCOPY  08/2008   Polyps/ re check 5 yrs   CT SINUS LTD W/O CM  02/2009   negative   DILATION AND CURETTAGE OF UTERUS     ESOPHAGOGASTRODUODENOSCOPY  08/2008   Erosive gastritis, h  pylori (treated)   FOOT SURGERY     left plantar fascial problem   ROTATOR CUFF REPAIR     right   TONSILLECTOMY     TUBAL LIGATION     UPPER GASTROINTESTINAL ENDOSCOPY     Social History   Tobacco Use   Smoking status: Former    Packs/day: 0.50    Years: 30.00    Pack years: 15.00    Types: Cigarettes    Quit date: 06/17/2019    Years since quitting: 1.2   Smokeless tobacco: Never  Substance Use Topics   Alcohol use: No    Alcohol/week: 0.0 standard drinks   Drug use: No   Family History  Problem Relation  Age of Onset   Migraines Mother    Heart attack Mother    Coronary artery disease Mother    Coronary artery disease Father        6 bypasses    Stroke Father    Diabetes Father    Renal Disease Father    Colon cancer Maternal Grandfather 33   Coronary artery disease Cousin    Coronary artery disease Paternal Grandmother    Lung cancer Paternal Aunt    Multiple sclerosis Neg Hx    Esophageal cancer Neg Hx    Rectal cancer Neg Hx    Stomach cancer Neg Hx    Breast cancer Neg Hx    Allergies  Allergen Reactions   Percocet [Oxycodone-Acetaminophen] Shortness Of Breath    Felt like going to pass out and sweating   Tecfidera [Dimethyl Fumarate] Shortness Of Breath, Palpitations, Rash and Cough   Amitriptyline Hcl Itching and Swelling   Atorvastatin Other (See Comments)    elevated LFT's   Betaseron [Interferon Beta-1b] Other (See Comments)    Headache   Clarithromycin Other (See Comments)    reaction not known   Duloxetine Other (See Comments)    pain   Pregabalin Other (See Comments)    LE swelling   Pseudoephedrine Other (See Comments)    legs hurt   Zoloft [Sertraline Hcl] Other (See Comments)    Headaches    Levofloxacin Rash   Current Outpatient Medications on File Prior to Visit  Medication Sig Dispense Refill   albuterol (VENTOLIN HFA) 108 (90 Base) MCG/ACT inhaler INHALE 2 PUFFS UP TO EVERY 4 HOURS AS NEEDED FOR WHEEZING 3 each 3   ALPRAZolam  (XANAX) 0.5 MG tablet Take 1 tablet (0.5 mg total) by mouth 2 (two) times daily as needed. For anxiety. 30 tablet 0   azelastine (ASTELIN) 0.1 % nasal spray Place 2 sprays into both nostrils 2 (two) times daily as needed.  12   Blood Glucose Monitoring Suppl (ACCU-CHEK AVIVA PLUS) w/Device KIT Check blood sugar once daily and as directed Dx R73.09 1 kit 0   Diclofenac Sodium 3 % GEL Place 1 application onto the skin every 12 (twelve) hours as needed. (Patient taking differently: Place 1 application onto the skin every 12 (twelve) hours as needed (rash).) 50 g 0   gabapentin (NEURONTIN) 300 MG capsule TAKE 1 CAPSULE BY MOUTH TWICE DURING THE DAY AND 3 CAPSULES BY MOUTH AT NIGHT (Patient taking differently: Take 300-900 mg by mouth See admin instructions. Take 1 capsule twice during the day (morning and afternoon) and 3 capsule at night) 450 capsule 3   ipratropium-albuterol (DUONEB) 0.5-2.5 (3) MG/3ML SOLN TAKE 3 MLS BY NEBULIZATION EVERY 6 (SIX) HOURS AS NEEDED. (Patient taking differently: Take 3 mLs by nebulization every 6 (six) hours as needed (shortness of breath).) 360 mL 0   ondansetron (ZOFRAN ODT) 4 MG disintegrating tablet Take 1 tablet (4 mg total) by mouth every 8 (eight) hours as needed for nausea or vomiting. 20 tablet 0   pantoprazole (PROTONIX) 40 MG tablet Take 40 mg by mouth 2 (two) times daily.     potassium chloride (KLOR-CON M10) 10 MEQ tablet Take 1 tablet (10 mEq total) by mouth daily. **pt only needs to take 1 tab daily** 30 tablet 2   tiZANidine (ZANAFLEX) 4 MG capsule Take 1 capsule (4 mg total) by mouth 3 (three) times daily as needed for muscle spasms. 30 capsule 3   No current facility-administered medications on file prior to  visit.    Review of Systems  Constitutional:  Positive for fatigue. Negative for activity change, appetite change, fever and unexpected weight change.  HENT:  Negative for congestion, ear pain, rhinorrhea, sinus pressure and sore throat.   Eyes:   Negative for pain, redness and visual disturbance.  Respiratory:  Positive for cough, chest tightness and shortness of breath. Negative for wheezing.   Cardiovascular:  Negative for chest pain, palpitations and leg swelling.       Chest feels heavy all the time  Gastrointestinal:  Negative for abdominal pain, blood in stool, constipation and diarrhea.  Endocrine: Negative for polydipsia and polyuria.  Genitourinary:  Negative for dysuria, frequency and urgency.  Musculoskeletal:  Positive for arthralgias and back pain. Negative for myalgias.       Some muscle cramps in her hands  Skin:  Negative for pallor and rash.  Allergic/Immunologic: Negative for environmental allergies.  Neurological:  Positive for weakness. Negative for dizziness, syncope and headaches.       Baseline MS  Hematological:  Negative for adenopathy. Does not bruise/bleed easily.  Psychiatric/Behavioral:  Negative for decreased concentration and dysphoric mood. The patient is not nervous/anxious.       Objective:   Physical Exam Constitutional:      General: She is not in acute distress.    Appearance: Normal appearance. She is well-developed. She is obese. She is not ill-appearing or diaphoretic.  HENT:     Head: Normocephalic and atraumatic.  Eyes:     General: No scleral icterus.       Right eye: No discharge.        Left eye: No discharge.     Conjunctiva/sclera: Conjunctivae normal.     Pupils: Pupils are equal, round, and reactive to light.  Neck:     Thyroid: No thyromegaly.     Vascular: No carotid bruit or JVD.  Cardiovascular:     Rate and Rhythm: Normal rate and regular rhythm.     Heart sounds: Normal heart sounds.    No gallop.  Pulmonary:     Effort: Pulmonary effort is normal. No respiratory distress.     Breath sounds: Normal breath sounds. No wheezing or rales.     Comments: Diffusely distant bs No crackles  Wheeze only on forced exp Chest:     Chest wall: No tenderness.  Abdominal:      General: Bowel sounds are normal. There is no distension or abdominal bruit.     Palpations: Abdomen is soft. There is no mass.     Tenderness: There is no abdominal tenderness.     Comments: Pt burps and c/o doing that all the time  Musculoskeletal:     Cervical back: Normal range of motion and neck supple.     Right lower leg: No edema.     Left lower leg: No edema.  Lymphadenopathy:     Cervical: No cervical adenopathy.  Skin:    General: Skin is warm and dry.     Coloration: Skin is not pale.     Findings: No erythema or rash.  Neurological:     Mental Status: She is alert.     Cranial Nerves: No cranial nerve deficit.     Coordination: Coordination normal.     Deep Tendon Reflexes: Reflexes are normal and symmetric. Reflexes normal.  Psychiatric:        Mood and Affect: Mood normal.          Assessment & Plan:  Problem List Items Addressed This Visit       Respiratory   Asthmatic bronchitis , chronic ([place])    More sob since having pna (re check cxr today) Quit smoking in jan  Enc to start back on symbicort daily and update (rinse mouth and can use nystatin solun if thrush)       Relevant Medications   budesonide-formoterol (SYMBICORT) 160-4.5 MCG/ACT inhaler   Other Relevant Orders   DG Chest 2 View   CAP (community acquired pneumonia) - Primary    Still c/o sob and fatigue despite prednisone cxr today for re check  Enc to re start symbicort        Relevant Medications   budesonide-formoterol (SYMBICORT) 160-4.5 MCG/ACT inhaler   Other Relevant Orders   DG Chest 2 View     Other   SOB (shortness of breath)    More problematic since pna  cxr today  Will get back on symbicort  Her pumonologist moved Consider cardiology attention if no imp (rev last myoview and echo)       Hypokalemia    Now taking klor con 10 meq daily  bmet ordered today       Relevant Orders   Basic metabolic panel

## 2020-09-10 NOTE — Assessment & Plan Note (Signed)
Still c/o sob and fatigue despite prednisone cxr today for re check  Enc to re start symbicort

## 2020-09-12 ENCOUNTER — Other Ambulatory Visit: Payer: Self-pay

## 2020-09-12 ENCOUNTER — Other Ambulatory Visit (INDEPENDENT_AMBULATORY_CARE_PROVIDER_SITE_OTHER): Payer: Medicare HMO

## 2020-09-12 DIAGNOSIS — R350 Frequency of micturition: Secondary | ICD-10-CM

## 2020-09-12 LAB — POC URINALSYSI DIPSTICK (AUTOMATED)
Bilirubin, UA: NEGATIVE
Blood, UA: NEGATIVE
Glucose, UA: NEGATIVE
Ketones, UA: NEGATIVE
Leukocytes, UA: NEGATIVE
Nitrite, UA: NEGATIVE
Protein, UA: NEGATIVE
Spec Grav, UA: 1.015 (ref 1.010–1.025)
Urobilinogen, UA: 0.2 E.U./dL
pH, UA: 6 (ref 5.0–8.0)

## 2020-09-25 ENCOUNTER — Telehealth: Payer: Self-pay | Admitting: Family Medicine

## 2020-09-25 DIAGNOSIS — R7989 Other specified abnormal findings of blood chemistry: Secondary | ICD-10-CM

## 2020-09-25 NOTE — Telephone Encounter (Signed)
-----   Message from Cloyd Stagers, RT sent at 09/15/2020  8:45 AM EDT ----- Regarding: Lab Orders for Friday 7.22.2022 Please place lab orders for Friday 7.22.2022, appt notes state "2 wk f/u" Thank you, Dyke Maes RT(R)

## 2020-09-26 ENCOUNTER — Other Ambulatory Visit: Payer: Medicare HMO

## 2020-09-30 ENCOUNTER — Other Ambulatory Visit: Payer: Self-pay

## 2020-09-30 ENCOUNTER — Other Ambulatory Visit (INDEPENDENT_AMBULATORY_CARE_PROVIDER_SITE_OTHER): Payer: Medicare HMO

## 2020-09-30 DIAGNOSIS — R7989 Other specified abnormal findings of blood chemistry: Secondary | ICD-10-CM

## 2020-09-30 LAB — BASIC METABOLIC PANEL
BUN: 14 mg/dL (ref 6–23)
CO2: 30 mEq/L (ref 19–32)
Calcium: 9.5 mg/dL (ref 8.4–10.5)
Chloride: 102 mEq/L (ref 96–112)
Creatinine, Ser: 1.03 mg/dL (ref 0.40–1.20)
GFR: 58.2 mL/min — ABNORMAL LOW (ref >60.00)
Glucose, Bld: 86 mg/dL (ref 70–99)
Potassium: 4.6 mEq/L (ref 3.5–5.1)
Sodium: 137 mEq/L (ref 135–145)

## 2020-10-07 ENCOUNTER — Ambulatory Visit: Payer: Medicare HMO | Admitting: Neurology

## 2020-10-07 ENCOUNTER — Encounter: Payer: Self-pay | Admitting: Neurology

## 2020-10-07 VITALS — BP 118/84 | HR 84 | Ht 61.0 in | Wt 193.2 lb

## 2020-10-07 DIAGNOSIS — G35 Multiple sclerosis: Secondary | ICD-10-CM

## 2020-10-07 NOTE — Progress Notes (Signed)
Reason for visit: Multiple sclerosis  Karina [name] is an 65 y.o. female  History of present illness:  Karina [name] is a 65 year old right-handed white female with history of multiple sclerosis.  The patient has not been on any disease modifying agents for at least 6 years.  The patient had been on Betaseron in the past but had problems with subcutaneous nodules and had to stop, she was placed on Tecfidera but had significant GI upset on the medication.  The patient had MRI of the brain done in February 2022 that showed some minimal progression from prior study done in 2019.  The patient clinically has done quite well without any new numbness, weakness, or balance changes.  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  She reports urinary frequency but drinks a lot of water during the day.  She denies any new visual changes.  She recently had cataract surgery.  She was in the emergency room on 18 August 2020 with pneumonia.  She has been complaining of a headache as well but this improved with treatment of the pneumonia.  She returns to the office today for an evaluation.  Past Medical History:  Diagnosis Date   Allergic rhinitis    Allergy    Anxiety    Councelor- Pervis Hocking, no per pt   Cataract    Colon polyps 08/2008   Adenomatous   COPD (chronic obstructive pulmonary disease) ([place])    Depression    no per pt   Diabetes mellitus    Gastritis 08/2008   H. pylori   GERD (gastroesophageal reflux disease)    History of shingles    Left V1   Hypercholesteremia    Lung nodule    Menopausal disorder    Multiple sclerosis (HCC)    Neuromuscular disorder (HCC)    Multiple sclerosis   Plantar fasciitis    Seizure disorder ([place])    single seizure/hypoglycemia   Seizures ([place])    1 seizure in 2001 due to blood sugar dropping to 38, none since   Skin lesion 04/05/2017   Urinary incontinence    Vertigo     Past Surgical History:  Procedure Laterality Date   CERVICAL  DISCECTOMY  2004   x 2, Anterior; Fusion C4-5, C5-6, C6-7, Dr. Kary Kos   CHEST CT  11/2008   With small 4 mm nodule L lung base (rec re check in 1 year)   CHEST CT  07/2009   Re check chest CT - lung nodule stable   CHOLECYSTECTOMY     COLONOSCOPY  08/2008   Polyps/ re check 5 yrs   CT SINUS LTD W/O CM  02/2009   negative   DILATION AND CURETTAGE OF UTERUS     ESOPHAGOGASTRODUODENOSCOPY  08/2008   Erosive gastritis, h pylori (treated)   FOOT SURGERY     left plantar fascial problem   ROTATOR CUFF REPAIR     right   TONSILLECTOMY     TUBAL LIGATION     UPPER GASTROINTESTINAL ENDOSCOPY      Family History  Problem Relation Age of Onset   Migraines Mother    Heart attack Mother    Coronary artery disease Mother    Coronary artery disease Father        6 bypasses    Stroke Father    Diabetes Father    Renal Disease Father    Colon cancer Maternal Grandfather 45   Coronary artery disease Cousin  Coronary artery disease Paternal Grandmother    Lung cancer Paternal Aunt    Multiple sclerosis Neg Hx    Esophageal cancer Neg Hx    Rectal cancer Neg Hx    Stomach cancer Neg Hx    Breast cancer Neg Hx     Social history:  reports that she quit smoking about 15 months ago. Her smoking use included cigarettes. She has a 15.00 pack-year smoking history. She has never used smokeless tobacco. She reports that she does not drink alcohol and does not use drugs.    Allergies  Allergen Reactions   Percocet [Oxycodone-Acetaminophen] Shortness Of Breath    Felt like going to pass out and sweating   Tecfidera [Dimethyl Fumarate] Shortness Of Breath, Palpitations, Rash and Cough   Amitriptyline Hcl Itching and Swelling   Atorvastatin Other (See Comments)    elevated LFT's   Betaseron [Interferon Beta-1b] Other (See Comments)    Headache   Clarithromycin Other (See Comments)    reaction not known   Duloxetine Other (See Comments)    pain   Pregabalin Other (See Comments)     LE swelling   Pseudoephedrine Other (See Comments)    legs hurt   Zoloft [Sertraline Hcl] Other (See Comments)    Headaches    Levofloxacin Rash    Medications:  Prior to Admission medications   Medication Sig Start Date End Date Taking? Authorizing Provider  albuterol (VENTOLIN HFA) 108 (90 Base) MCG/ACT inhaler INHALE 2 PUFFS UP TO EVERY 4 HOURS AS NEEDED FOR WHEEZING 08/27/20  Yes [name], Wynelle Fanny, MD  ALPRAZolam (XANAX) 0.5 MG tablet Take 1 tablet (0.5 mg total) by mouth 2 (two) times daily as needed. For anxiety. 02/11/15  Yes [name], Wynelle Fanny, MD  azelastine (ASTELIN) 0.1 % nasal spray Place 2 sprays into both nostrils 2 (two) times daily as needed. 09/20/14  Yes [provider]  Blood Glucose Monitoring Suppl (ACCU-CHEK AVIVA PLUS) w/Device KIT Check blood sugar once daily and as directed Dx R73.09 08/19/15  Yes [name], Wynelle Fanny, MD  budesonide-formoterol (SYMBICORT) 160-4.5 MCG/ACT inhaler Inhale 2 puffs into the lungs 2 (two) times daily. 09/10/20  Yes [name], Wynelle Fanny, MD  Diclofenac Sodium 3 % GEL Place 1 application onto the skin every 12 (twelve) hours as needed. Patient taking differently: Place 1 application onto the skin every 12 (twelve) hours as needed (rash). 06/18/16  Yes Schaevitz, Randall An, MD  gabapentin (NEURONTIN) 300 MG capsule TAKE 1 CAPSULE BY MOUTH TWICE DURING THE DAY AND 3 CAPSULES BY MOUTH AT NIGHT Patient taking differently: Take 300-900 mg by mouth See admin instructions. Take 1 capsule twice during the day (morning and afternoon) and 3 capsule at night 04/14/20  Yes Millikan, Megan, NP  ipratropium-albuterol (DUONEB) 0.5-2.5 (3) MG/3ML SOLN TAKE 3 MLS BY NEBULIZATION EVERY 6 (SIX) HOURS AS NEEDED. Patient taking differently: Take 3 mLs by nebulization every 6 (six) hours as needed (shortness of breath). 09/05/18  Yes Laverle Hobby, MD  nystatin (MYCOSTATIN) 100000 UNIT/ML suspension Swish and swallow 5 ml as needed up to three times daily for thrush  09/10/20  Yes [name], Wynelle Fanny, MD  ondansetron (ZOFRAN ODT) 4 MG disintegrating tablet Take 1 tablet (4 mg total) by mouth every 8 (eight) hours as needed for nausea or vomiting. 08/15/20  Yes Loni Beckwith, PA-C  pantoprazole (PROTONIX) 40 MG tablet Take 40 mg by mouth 2 (two) times daily. 09/05/19  Yes [provider]  potassium chloride (KLOR-CON M10) 10 MEQ  tablet Take 1 tablet (10 mEq total) by mouth daily. **pt only needs to take 1 tab daily** 09/09/20  Yes [name], Wynelle Fanny, MD  tiZANidine (ZANAFLEX) 4 MG capsule Take 1 capsule (4 mg total) by mouth 3 (three) times daily as needed for muscle spasms. 08/13/20  Yes [name], Wynelle Fanny, MD    ROS:  Out of a complete 14 system review of symptoms, the patient complains only of the following symptoms, and all other reviewed systems are negative.  Joint pains   Blood pressure 118/84, pulse 84, height _0  (1.549 m), weight 193 lb 4 oz (87.7 kg), SpO2 96 %.  Physical Exam  General: The patient is alert and cooperative at the time of the examination.  The patient is moderately obese.  Skin: No significant peripheral edema is noted.   Neurologic Exam  Mental status: The patient is alert and oriented x 3 at the time of the examination. The patient has apparent normal recent and remote memory, with an apparently normal attention span and concentration ability.   Cranial nerves: Facial symmetry is present. Speech is normal, no aphasia or dysarthria is noted. Extraocular movements are full. Visual fields are full.  Pupils are equal, round, and reactive to light.  Discs are flat bilaterally.  Motor: The patient has good strength in all 4 extremities.  Sensory examination: Soft touch sensation is symmetric on the face, arms, and legs.  Coordination: The patient has good finger-nose-finger and heel-to-shin bilaterally.  Gait and station: The patient has a normal gait. Tandem gait is normal. Romberg is negative. No drift is  seen.  Reflexes: Deep tendon reflexes are symmetric.   Assessment/Plan:  1.  Multiple sclerosis  The patient clinically has been very stable.  Very minimal progression was noted on recent MRI of the brain.  The patient is not clear that she wants to start a new medication for multiple sclerosis.  She is concerned about potential side effects.  If the patient is not to go on a disease modifying agent, we will need to follow the MRI studies over time, she will follow-up in 1 year and we will consider getting another MRI at that time.  In the future, she can be followed through Dr. Felecia Shelling.  She is to contact our office if new deficits occur.  Jill Alexanders MD 10/07/2020 1:17 PM  [facility] [address] [place], [place] [address]  Phone [phone] Fax [phone]

## 2020-10-13 ENCOUNTER — Ambulatory Visit: Payer: Medicare HMO | Admitting: Adult Health

## 2020-10-17 ENCOUNTER — Encounter: Payer: Self-pay | Admitting: Gastroenterology

## 2020-10-23 DIAGNOSIS — Z961 Presence of intraocular lens: Secondary | ICD-10-CM | POA: Diagnosis not present

## 2020-11-18 ENCOUNTER — Ambulatory Visit: Payer: Medicare HMO | Admitting: Neurology

## 2020-12-05 ENCOUNTER — Other Ambulatory Visit: Payer: Self-pay

## 2020-12-05 ENCOUNTER — Telehealth (INDEPENDENT_AMBULATORY_CARE_PROVIDER_SITE_OTHER): Payer: Medicare HMO | Admitting: Nurse Practitioner

## 2020-12-05 VITALS — Temp 98.2°F

## 2020-12-05 DIAGNOSIS — R0602 Shortness of breath: Secondary | ICD-10-CM | POA: Diagnosis not present

## 2020-12-05 DIAGNOSIS — J0110 Acute frontal sinusitis, unspecified: Secondary | ICD-10-CM | POA: Diagnosis not present

## 2020-12-05 MED ORDER — AMOXICILLIN-POT CLAVULANATE 875-125 MG PO TABS: 1.0000 | ORAL_TABLET | Freq: Two times a day (BID) | ORAL | 0 refills | Status: AC

## 2020-12-05 MED ORDER — PREDNISONE 20 MG PO TABS: ORAL_TABLET | ORAL | 0 refills | Status: AC

## 2020-12-05 NOTE — Progress Notes (Signed)
Patient ID: Karina [name], female    DOB: [birth year], 65 y.o.   MRN: [id number]  Virtual visit completed through [employer], a video enabled telemedicine application. Due to national recommendations of social distancing due to COVID-19, a virtual visit is felt to be most appropriate for this patient at this time. Reviewed limitations, risks, security and privacy concerns of performing a virtual visit and the availability of in person appointments. I also reviewed that there may be a patient responsible charge related to this service. The patient agreed to proceed.   Patient location: home Provider location: [place] at [facility], office Persons participating in this virtual visit: patient, provider   If any vitals were documented, they were collected by patient at home unless specified below.    Temp 98.2 F (36.8 C)    CC: Headache Subjective:   HPI: Karina [name] is a 65 y.o. female presenting on 12/05/2020 for Sinus Problem (Sx started on 11/30/20. Started with sinus pain/pressure, nasal congestion,  chest congestion, cough-clear phlegm.)   Sunday symptoms started 11/30/2020 Tested for covid yesterday (12/04/2020) negative  Robitussin and nasal saline OTC Has been using her albuterol inhaler more than normal  Coughing up phlegm and sinus pressure   Relevant past medical, surgical, family and social history reviewed and updated as indicated. Interim medical history since our last visit reviewed. Allergies and medications reviewed and updated. Outpatient Medications Prior to Visit  Medication Sig Dispense Refill   albuterol (VENTOLIN HFA) 108 (90 Base) MCG/ACT inhaler INHALE 2 PUFFS UP TO EVERY 4 HOURS AS NEEDED FOR WHEEZING 3 each 3   ALPRAZolam (XANAX) 0.5 MG tablet Take 1 tablet (0.5 mg total) by mouth 2 (two) times daily as needed. For anxiety. 30 tablet 0   azelastine (ASTELIN) 0.1 % nasal spray Place 2 sprays into both nostrils 2 (two) times daily as needed.  12   Blood  Glucose Monitoring Suppl (ACCU-CHEK AVIVA PLUS) w/Device KIT Check blood sugar once daily and as directed Dx R73.09 1 kit 0   budesonide-formoterol (SYMBICORT) 160-4.5 MCG/ACT inhaler Inhale 2 puffs into the lungs 2 (two) times daily. 3 each 2   Diclofenac Sodium 3 % GEL Place 1 application onto the skin every 12 (twelve) hours as needed. (Patient taking differently: Place 1 application onto the skin every 12 (twelve) hours as needed (rash).) 50 g 0   gabapentin (NEURONTIN) 300 MG capsule TAKE 1 CAPSULE BY MOUTH TWICE DURING THE DAY AND 3 CAPSULES BY MOUTH AT NIGHT (Patient taking differently: Take 300-900 mg by mouth See admin instructions. Take 1 capsule twice during the day (morning and afternoon) and 3 capsule at night) 450 capsule 3   ipratropium-albuterol (DUONEB) 0.5-2.5 (3) MG/3ML SOLN TAKE 3 MLS BY NEBULIZATION EVERY 6 (SIX) HOURS AS NEEDED. (Patient taking differently: Take 3 mLs by nebulization every 6 (six) hours as needed (shortness of breath).) 360 mL 0   nystatin (MYCOSTATIN) 100000 UNIT/ML suspension Swish and swallow 5 ml as needed up to three times daily for thrush 120 mL 1   ondansetron (ZOFRAN ODT) 4 MG disintegrating tablet Take 1 tablet (4 mg total) by mouth every 8 (eight) hours as needed for nausea or vomiting. 20 tablet 0   pantoprazole (PROTONIX) 40 MG tablet Take 40 mg by mouth 2 (two) times daily.     potassium chloride (KLOR-CON M10) 10 MEQ tablet Take 1 tablet (10 mEq total) by mouth daily. **pt only needs to take 1 tab daily** 30 tablet 2  tiZANidine (ZANAFLEX) 4 MG capsule Take 1 capsule (4 mg total) by mouth 3 (three) times daily as needed for muscle spasms. 30 capsule 3   No facility-administered medications prior to visit.     Per HPI unless specifically indicated in ROS section below Review of Systems  Constitutional:  Positive for fever. Negative for chills.  HENT:  Positive for congestion, sinus pressure and sinus pain.   Eyes:  Negative for visual  disturbance.  Respiratory:  Positive for cough (clear). Negative for shortness of breath.   Cardiovascular:  Negative for chest pain.  Gastrointestinal:  Positive for nausea. Negative for diarrhea and vomiting.  Musculoskeletal:  Negative for arthralgias and myalgias.  Neurological:  Positive for headaches.  Objective:  Temp 98.2 F (36.8 C)   Wt Readings from Last 3 Encounters:  10/07/20 193 lb 4 oz (87.7 kg)  09/10/20 185 lb 7 oz (84.1 kg)  08/27/20 184 lb (83.5 kg)     Physical exam: Gen: alert, NAD, not ill appearing Pulm: speaks in complete sentences without increased work of breathing Psych: normal mood, normal thought content      Results for orders placed or performed in visit on 46/96/29  Basic metabolic panel  Result Value Ref Range   Sodium 137 135 - 145 mEq/L   Potassium 4.6 3.5 - 5.1 mEq/L   Chloride 102 96 - 112 mEq/L   CO2 30 19 - 32 mEq/L   Glucose, Bld 86 70 - 99 mg/dL   BUN 14 6 - 23 mg/dL   Creatinine, Ser 1.03 0.40 - 1.20 mg/dL   GFR 58.20 (L) >60.00 mL/min   Calcium 9.5 8.4 - 10.5 mg/dL   Assessment & Plan:   Problem List Items Addressed This Visit       Respiratory   Acute frontal sinusitis - Primary    The same.  We will go ahead and treat her since symptoms been going on for 5 days.  Look back in the past she has tolerated Augmentin well.  Send in Augmentin 875 twice daily for 7 days. Start medication as soon as possible.      Relevant Medications   amoxicillin-clavulanate (AUGMENTIN) 875-125 MG tablet   predniSONE (DELTASONE) 20 MG tablet     Other   SOB (shortness of breath)    Patient denies explicit shortness of breath.  But when questioned further she is using her albuterol inhaler more than normal.  Since she is having increased use we will go ahead and send in some prednisone.  Discussed this with patient. Start prednisone soon as possible continue to completion      Relevant Medications   predniSONE (DELTASONE) 20 MG tablet      No orders of the defined types were placed in this encounter.  No orders of the defined types were placed in this encounter.   I discussed the assessment and treatment plan with the patient. The patient was provided an opportunity to ask questions and all were answered. The patient agreed with the plan and demonstrated an understanding of the instructions. The patient was advised to call back or seek an in-person evaluation if the symptoms worsen or if the condition fails to improve as anticipated.  Follow up plan: No follow-ups on file.  Romilda Garret, NP

## 2020-12-05 NOTE — Assessment & Plan Note (Signed)
The same.  We will go ahead and treat her since symptoms been going on for 5 days.  Look back in the past she has tolerated Augmentin well.  Send in Augmentin 875 twice daily for 7 days. Start medication as soon as possible.

## 2020-12-05 NOTE — Assessment & Plan Note (Signed)
Patient denies explicit shortness of breath.  But when questioned further she is using her albuterol inhaler more than normal.  Since she is having increased use we will go ahead and send in some prednisone.  Discussed this with patient. Start prednisone soon as possible continue to completion

## 2021-01-07 ENCOUNTER — Institutional Professional Consult (permissible substitution): Payer: Medicare HMO | Admitting: Pulmonary Disease

## 2021-01-08 ENCOUNTER — Encounter: Payer: Self-pay | Admitting: Pulmonary Disease

## 2021-01-08 ENCOUNTER — Other Ambulatory Visit: Payer: Self-pay

## 2021-01-08 ENCOUNTER — Ambulatory Visit: Payer: Medicare HMO | Admitting: Pulmonary Disease

## 2021-01-08 ENCOUNTER — Ambulatory Visit
Admission: RE | Admit: 2021-01-08 | Discharge: 2021-01-08 | Disposition: A | Discharge status: Home / Self Care | Payer: Medicare HMO | Source: Ambulatory Visit | Attending: Pulmonary Disease | Admitting: Pulmonary Disease

## 2021-01-08 ENCOUNTER — Ambulatory Visit
Admission: RE | Admit: 2021-01-08 | Discharge: 2021-01-08 | Disposition: A | Discharge status: Home / Self Care | Payer: Medicare HMO | Attending: Pulmonary Disease | Admitting: Pulmonary Disease

## 2021-01-08 VITALS — BP 124/74 | HR 87 | Temp 98.2°F | Ht 61.0 in | Wt 184.0 lb

## 2021-01-08 DIAGNOSIS — Z8616 Personal history of COVID-19: Secondary | ICD-10-CM | POA: Diagnosis not present

## 2021-01-08 DIAGNOSIS — R053 Chronic cough: Secondary | ICD-10-CM

## 2021-01-08 DIAGNOSIS — Z720 Tobacco use: Secondary | ICD-10-CM

## 2021-01-08 DIAGNOSIS — J189 Pneumonia, unspecified organism: Secondary | ICD-10-CM | POA: Diagnosis not present

## 2021-01-08 MED ORDER — BUDESONIDE-FORMOTEROL FUMARATE 160-4.5 MCG/ACT IN AERO: 2.0000 | INHALATION_SPRAY | Freq: Two times a day (BID) | RESPIRATORY_TRACT | 2 refills | Status: DC

## 2021-01-08 MED ORDER — PREDNISONE 10 MG PO TABS: ORAL_TABLET | ORAL | 0 refills | Status: AC

## 2021-01-08 MED ORDER — DOXYCYCLINE HYCLATE 100 MG PO TABS: 100.0000 mg | ORAL_TABLET | Freq: Two times a day (BID) | ORAL | 0 refills | Status: DC

## 2021-01-08 MED ORDER — AEROCHAMBER MV MISC: 0 refills | Status: AC

## 2021-01-08 MED ORDER — FLUTICASONE PROPIONATE 50 MCG/ACT NA SUSP: 1.0000 | Freq: Every day | NASAL | 2 refills | Status: DC

## 2021-01-08 NOTE — Progress Notes (Signed)
[facility]  Chief Complaint  Patient presents with   Consult    Post covid- end of September. Coughing, wheezing, hx of copd    Past Surgical History:  She  has a past surgical history that includes Cervical discectomy (2004); Tubal ligation; Dilation and curettage of uterus; Tonsillectomy; Foot surgery; Cholecystectomy; CHEST CT (11/2008); CHEST CT (07/2009); CT SINUS LTD W/O CM (02/2009); Colonoscopy (08/2008); Esophagogastroduodenoscopy (08/2008); Rotator cuff repair; and Upper gastrointestinal endoscopy.  Past Medical History:  Allergies, Anxiety, Cataract, Colon polyps, Depression, DM type 2, GERD, Shingles, HLD, Multiple sclerosis, Hypoglycemic seizure, Vertigo, Urinary incontinence, Rheumatoid arthritis  Constitutional:  BP 124/74 (BP Location: Left Arm, Patient Position: Sitting, Cuff Size: Normal)   Pulse 87   Temp 98.2 F (36.8 C) (Oral)   Ht 5\' 1"  (1.549 m)   Wt 184 lb (83.5 kg)   SpO2 98%   BMI 34.77 kg/m   Brief Summary:  Karina [name] is a 65 y.o. female smoker with cough.      Subjective:   She was seen previously by Dr. 68 for chronic bronchitis.  Chest xray from 08/18/20 showed right lower lobe infiltrate consistent with pneumonia, and this has cleared on chest xray from 09/10/20.  Her husband was exposed to COVID at work in the beginning of September.  He tested positive.  She did a home test and was positive also.  She didn't seek medical attention for COVID infection.  She was seen by PCP in end of September for sinusitis.  She was treated with augmentin and prednisone.  Since she tested positive for COVID her cough has not cleared up.  She had quit smoking for 9 months, but go angry at her husband and then started smoking again.  She is smoking 1/2 to 1 ppd.  She has thick, white phlegm.  She is getting more wheeze.  She has sinus and ear congestion, and hears ringing in her ears at times.  She is  having more trouble feeling short of breath with walking.  She gets tight in her chest when she gets more wheezing.  Has been using symbicort and albuterol intermittently.  These help.  No hemoptysis.  No longer having fever.  Denies leg swelling, skin rash, or joint swelling.  Wakes up with cough and wheeze at times.  Physical Exam:   Appearance - well kempt   ENMT - no sinus tenderness, no oral exudate, no LAN, Mallampati 3 airway, no stridor, clear nasal drainage, TM clear b/l  Respiratory - b/l wheeze and rhonchi more in upper lung fields that partially clears with cough  CV - s1s2 regular rate and rhythm, no murmurs  Ext - no clubbing, no edema  Skin - no rashes  Psych - normal mood and affect   Pulmonary testing:    Chest Imaging:    Cardiac Tests:  Echo 10/03/12 >> EF 60 to 65%, grade 1 DD, mild AR  Social History:  She  reports that she has been smoking cigarettes. She has a 60.00 pack-year smoking history. She has never used smokeless tobacco. She reports that she does not drink alcohol and does not use drugs.  Family History:  Her family history includes Colon cancer (age of onset: 66) in her maternal grandfather; Coronary artery disease in her cousin, father, mother, and paternal grandmother; Diabetes in her father; Heart attack in her mother; Lung cancer in her paternal aunt; Migraines in her mother; Renal Disease in her father; Stroke  in her father.    Discussion:  She has extensive history of smoking, and has history of chronic bronchitis and rhinitis.  She had community acquired pneumonia in June and COVID 19 infection in September.  She started smoking again also.  These have all combined to cause progression of her symptoms.  Assessment/Plan:   Chronic cough. - has progression of symptoms since she had COVID 19 infection in September 2022 - she has acute exacerbation - will give her course of prednisone and doxycycline - advised her to use symbicort 160 two  puffs bid with spacer device - prn albuterol or duoneb - discussed different roles for her inhalers - will arrange for chest xray - will arrange for pulmonary function test to assess for obstructive lung disease  Tinnitus with chronic rhinitis. - will have her use flonase and azelastine on regular basis for now - continue nasal irrigation  Tobacco abuse. - she will try quitting on her own  Multiple sclerosis. - followed by Dr. Felecia Shelling at [facility]  Rheumatoid arthritis. - previously followed by Dr. Lahoma Rocker with [facility] - doesn't seem to be an active issue at present  Time Spent Involved in Patient Care on Day of Examination:  63 minutes  Follow up:   Patient Instructions  Prednisone 10 mg pill >> 3 pills daily for 2 days, 2 pills daily for 2 days, 1 pill daily for 2 days.  Doxycyline 100 mg pill twice daily for 7 days.  Symbicort two puffs in the morning and two puffs in the evening, and rinse your mouth after each use; use symbicort with spacer device.  Albuterol two puffs every 6 hours as needed for cough, wheeze, or chest congestion.  You can use spacer device with albuterol.  Flonase 1 spray in each nostril daily.  Azelastine 1 spray in each nostril twice per day.  Will arrange for chest xray and pulmonary function test.  Follow up in 4 to 6 weeks with Dr. Halford Chessman or Nurse Practitioner.  Medication List:   Allergies as of 01/08/2021       Reactions   Percocet [oxycodone-acetaminophen] Shortness Of Breath   Felt like going to pass out and sweating   Tecfidera [dimethyl Fumarate] Shortness Of Breath, Palpitations, Rash, Cough   Amitriptyline Hcl Itching, Swelling   Atorvastatin Other (See Comments)   elevated LFT's   Betaseron [interferon Beta-1b] Other (See Comments)   Headache   Clarithromycin Other (See Comments)   reaction not known   Duloxetine Other (See Comments)   pain   Pregabalin Other (See Comments)   LE  swelling   Pseudoephedrine Other (See Comments)   legs hurt   Zoloft [sertraline Hcl] Other (See Comments)   Headaches   Levofloxacin Rash        Medication List        Accurate as of January 08, 2021 11:51 AM. If you have any questions, ask your nurse or doctor.          Accu-Chek Aviva Plus w/Device Kit Check blood sugar once daily and as directed Dx R73.09   AeroChamber MV inhaler Use as instructed Started by: Chesley Mires, MD   albuterol 108 (90 Base) MCG/ACT inhaler Commonly known as: Ventolin HFA INHALE 2 PUFFS UP TO EVERY 4 HOURS AS NEEDED FOR WHEEZING   ALPRAZolam 0.5 MG tablet Commonly known as: XANAX Take 1 tablet (0.5 mg total) by mouth 2 (two) times daily as needed. For anxiety.   azelastine 0.1 % nasal  spray Commonly known as: ASTELIN Place 2 sprays into both nostrils 2 (two) times daily as needed.   budesonide-formoterol 160-4.5 MCG/ACT inhaler Commonly known as: Symbicort Inhale 2 puffs into the lungs 2 (two) times daily.   Diclofenac Sodium 3 % Gel Place 1 application onto the skin every 12 (twelve) hours as needed. What changed: reasons to take this   doxycycline 100 MG tablet Commonly known as: VIBRA-TABS Take 1 tablet (100 mg total) by mouth 2 (two) times daily. Started by: Chesley Mires, MD   fluticasone 50 MCG/ACT nasal spray Commonly known as: FLONASE Place 1 spray into both nostrils daily. Started by: Chesley Mires, MD   gabapentin 300 MG capsule Commonly known as: NEURONTIN TAKE 1 CAPSULE BY MOUTH TWICE DURING THE DAY AND 3 CAPSULES BY MOUTH AT NIGHT What changed:  how much to take how to take this when to take this additional instructions   ipratropium-albuterol 0.5-2.5 (3) MG/3ML Soln Commonly known as: DUONEB TAKE 3 MLS BY NEBULIZATION EVERY 6 (SIX) HOURS AS NEEDED. What changed: reasons to take this   nystatin 100000 UNIT/ML suspension Commonly known as: MYCOSTATIN Swish and swallow 5 ml as needed up to three times daily  for thrush   ondansetron 4 MG disintegrating tablet Commonly known as: Zofran ODT Take 1 tablet (4 mg total) by mouth every 8 (eight) hours as needed for nausea or vomiting.   pantoprazole 40 MG tablet Commonly known as: PROTONIX Take 40 mg by mouth 2 (two) times daily.   predniSONE 10 MG tablet Commonly known as: DELTASONE Take 3 tablets (30 mg total) by mouth daily with breakfast for 2 days, THEN 2 tablets (20 mg total) daily with breakfast for 2 days, THEN 1 tablet (10 mg total) daily with breakfast for 2 days. Start taking on: January 08, 2021 Started by: Chesley Mires, MD        Signature:  Chesley Mires, MD [place] Pager - [phone] 01/08/2021, 11:51 AM

## 2021-01-08 NOTE — Patient Instructions (Signed)
Prednisone 10 mg pill >> 3 pills daily for 2 days, 2 pills daily for 2 days, 1 pill daily for 2 days.  Doxycyline 100 mg pill twice daily for 7 days.  Symbicort two puffs in the morning and two puffs in the evening, and rinse your mouth after each use; use symbicort with spacer device.  Albuterol two puffs every 6 hours as needed for cough, wheeze, or chest congestion.  You can use spacer device with albuterol.  Flonase 1 spray in each nostril daily.  Azelastine 1 spray in each nostril twice per day.  Will arrange for chest xray and pulmonary function test.  Follow up in 4 to 6 weeks with Dr. Halford Chessman or Nurse Practitioner.

## 2021-01-13 ENCOUNTER — Other Ambulatory Visit: Payer: Self-pay

## 2021-01-13 ENCOUNTER — Other Ambulatory Visit
Admission: RE | Admit: 2021-01-13 | Discharge: 2021-01-13 | Disposition: A | Discharge status: Home / Self Care | Payer: Medicare HMO | Source: Ambulatory Visit | Attending: Pulmonary Disease | Admitting: Pulmonary Disease

## 2021-01-13 DIAGNOSIS — Z01812 Encounter for preprocedural laboratory examination: Secondary | ICD-10-CM | POA: Insufficient documentation

## 2021-01-13 DIAGNOSIS — Z20822 Contact with and (suspected) exposure to covid-19: Secondary | ICD-10-CM | POA: Diagnosis not present

## 2021-01-14 ENCOUNTER — Ambulatory Visit: Payer: Medicare HMO | Attending: Pulmonary Disease

## 2021-01-14 DIAGNOSIS — R053 Chronic cough: Secondary | ICD-10-CM | POA: Insufficient documentation

## 2021-01-14 LAB — PULMONARY FUNCTION TEST ARMC ONLY
DL/VA % pred: 68 %
DL/VA: 2.93 ml/min/mmHg/L
DLCO unc % pred: 48 %
DLCO unc: 8.8 ml/min/mmHg
FEF 25-75 Post: 0.31 L/sec
FEF 25-75 Pre: 0.29 L/sec
FEF2575-%Change-Post: 6 %
FEF2575-%Pred-Post: 14 %
FEF2575-%Pred-Pre: 14 %
FEV1-%Change-Post: 3 %
FEV1-%Pred-Post: 28 %
FEV1-%Pred-Pre: 27 %
FEV1-Post: 0.64 L
FEV1-Pre: 0.61 L
FEV1FVC-%Change-Post: -3 %
FEV1FVC-%Pred-Pre: 55 %
FEV6-%Change-Post: 4 %
FEV6-%Pred-Post: 52 %
FEV6-%Pred-Pre: 50 %
FEV6-Post: 1.46 L
FEV6-Pre: 1.4 L
FEV6FVC-%Change-Post: -2 %
FEV6FVC-%Pred-Post: 100 %
FEV6FVC-%Pred-Pre: 102 %
FVC-%Change-Post: 6 %
FVC-%Pred-Post: 52 %
FVC-%Pred-Pre: 49 %
FVC-Post: 1.52 L
FVC-Pre: 1.42 L
Post FEV1/FVC ratio: 42 %
Post FEV6/FVC ratio: 96 %
Pre FEV1/FVC ratio: 43 %
Pre FEV6/FVC Ratio: 99 %
RV % pred: 175 %
RV: 3.3 L
TLC % pred: 104 %
TLC: 4.81 L

## 2021-01-14 LAB — SARS CORONAVIRUS 2 (TAT 6-24 HRS): SARS Coronavirus 2: NEGATIVE

## 2021-01-14 MED ORDER — ALBUTEROL SULFATE (2.5 MG/3ML) 0.083% IN NEBU
2.5000 mg | INHALATION_SOLUTION | Freq: Once | RESPIRATORY_TRACT | Status: AC
  Administered 2021-01-14: 2.5 mg via RESPIRATORY_TRACT
  Filled 2021-01-14: qty 3

## 2021-01-22 ENCOUNTER — Other Ambulatory Visit: Payer: Self-pay

## 2021-01-22 ENCOUNTER — Ambulatory Visit: Payer: Medicare HMO | Admitting: Dermatology

## 2021-01-22 DIAGNOSIS — L578 Other skin changes due to chronic exposure to nonionizing radiation: Secondary | ICD-10-CM

## 2021-01-22 DIAGNOSIS — L821 Other seborrheic keratosis: Secondary | ICD-10-CM

## 2021-01-22 DIAGNOSIS — L570 Actinic keratosis: Secondary | ICD-10-CM

## 2021-01-22 NOTE — Patient Instructions (Signed)

## 2021-01-22 NOTE — Progress Notes (Signed)
   Follow-Up Visit   Subjective  Early Karina [name] is a 65 y.o. female who presents for the following: Other (Sore on nose x ~6 months that has bled.). The patient has spots, moles and lesions to be evaluated, some may be new or changing and the patient has concerns that these could be cancer. The following portions of the chart were reviewed this encounter and updated as appropriate:   Tobacco  Allergies  Meds  Problems  Med Hx  Surg Hx  Fam Hx     Review of Systems:  No other skin or systemic complaints except as noted in HPI or Assessment and Plan.  Objective  Well appearing patient in no apparent distress; mood and affect are within normal limits.  A focused examination was performed including face. Relevant physical exam findings are noted in the Assessment and Plan.  Left nose supratip Erythematous thin papules/macules with gritty scale.         Assessment & Plan   Actinic Damage - chronic, secondary to cumulative UV radiation exposure/sun exposure over time - diffuse scaly erythematous macules with underlying dyspigmentation - Recommend daily broad spectrum sunscreen SPF 30+ to sun-exposed areas, reapply every 2 hours as needed.  - Recommend staying in the shade or wearing long sleeves, sun glasses (UVA+UVB protection) and wide brim hats (4-inch brim around the entire circumference of the hat). - Call for new or changing lesions.  Seborrheic Keratoses - Stuck-on, waxy, tan-brown papules and/or plaques  - Benign-appearing - Discussed benign etiology and prognosis. - Observe - Call for any changes  AK (actinic keratosis) Left nose supratip  Destruction of lesion - Left nose supratip Complexity: simple   Destruction method: cryotherapy   Informed consent: discussed and consent obtained   Timeout:  patient name, date of birth, surgical site, and procedure verified Lesion destroyed using liquid nitrogen: Yes   Region frozen until ice ball extended beyond  lesion: Yes   Outcome: patient tolerated procedure well with no complications   Post-procedure details: wound care instructions given    Return in about 2 months (around 03/24/2021).  I, Ashok Cordia, CMA, am acting as scribe for Sarina Ser, MD . Documentation: I have reviewed the above documentation for accuracy and completeness, and I agree with the above.  Sarina Ser, MD

## 2021-02-06 ENCOUNTER — Encounter: Payer: Self-pay | Admitting: Dermatology

## 2021-02-10 ENCOUNTER — Ambulatory Visit: Payer: Medicare HMO | Admitting: Pulmonary Disease

## 2021-02-10 ENCOUNTER — Encounter: Payer: Self-pay | Admitting: Pulmonary Disease

## 2021-02-10 ENCOUNTER — Other Ambulatory Visit: Payer: Self-pay

## 2021-02-10 VITALS — BP 114/72 | HR 79 | Temp 98.0°F | Ht 61.0 in | Wt 184.2 lb

## 2021-02-10 DIAGNOSIS — J479 Bronchiectasis, uncomplicated: Secondary | ICD-10-CM

## 2021-02-10 DIAGNOSIS — J849 Interstitial pulmonary disease, unspecified: Secondary | ICD-10-CM

## 2021-02-10 DIAGNOSIS — J449 Chronic obstructive pulmonary disease, unspecified: Secondary | ICD-10-CM

## 2021-02-10 DIAGNOSIS — Z8616 Personal history of COVID-19: Secondary | ICD-10-CM

## 2021-02-10 DIAGNOSIS — Z836 Family history of other diseases of the respiratory system: Secondary | ICD-10-CM | POA: Diagnosis not present

## 2021-02-10 DIAGNOSIS — Z720 Tobacco use: Secondary | ICD-10-CM | POA: Diagnosis not present

## 2021-02-10 MED ORDER — BREZTRI AEROSPHERE 160-9-4.8 MCG/ACT IN AERO: 2.0000 | INHALATION_SPRAY | Freq: Two times a day (BID) | RESPIRATORY_TRACT | 0 refills | Status: DC

## 2021-02-10 NOTE — Patient Instructions (Signed)
Try sample of breztri two puffs twice per day, and rinse your mouth after each use.  Call for a refill once your sample is done if you feel like breztri is helping.  Don't use symbicort while using breztri.  Will arrange for flutter valve.  You can use this twice per day as needed to help clear chest congestion.  Mucinex 1200 mg twice per day as needed to help clear chest congestion.  Will arrange for CT chest.  Follow up in [facility] office with Dr. Halford Chessman or Nurse Practitioner in 6 to 8 weeks

## 2021-02-10 NOTE — Progress Notes (Signed)
[facility]  Chief Complaint  Patient presents with   Follow-up    Cough not resolving     Past Surgical History:  She  has a past surgical history that includes Cervical discectomy (2004); Tubal ligation; Dilation and curettage of uterus; Tonsillectomy; Foot surgery; Cholecystectomy; CHEST CT (11/2008); CHEST CT (07/2009); CT SINUS LTD W/O CM (02/2009); Colonoscopy (08/2008); Esophagogastroduodenoscopy (08/2008); Rotator cuff repair; and Upper gastrointestinal endoscopy.  Past Medical History:  Allergies, Anxiety, Cataract, Colon polyps, Depression, DM type 2, GERD, Shingles, HLD, Multiple sclerosis, Hypoglycemic seizure, Vertigo, Urinary incontinence, Rheumatoid arthritis, COVID 24 November 2020  Constitutional:  BP 114/72 (BP Location: Right Arm, Patient Position: Sitting, Cuff Size: Normal)   Pulse 79   Temp 98 F (36.7 C) (Oral)   Ht 5\' 1"  (1.549 m)   Wt 184 lb 3.2 oz (83.6 kg)   SpO2 97%   BMI 34.80 kg/m   Brief Summary:  Karina [name] is a 65 y.o. female smoker with COPD.      Subjective:   She is here with her family.  CXR from 01/08/21 was clear.  PFT from 01/14/21 showed severe obstruction, air trapping, and moderate diffusion defect.  She is down to 1 ppd.  She plans to use nicotine patch.  She continues to cough.  Brings up about a soda bottle size amount of sputum in a day.  Sputum is clear to yellow.  No hemoptysis.  She has wheezing from her throat and chest.  She gets into coughing spells and sometimes feels like she is going to pass out.  She can't walk more than 50 feet without having to rest.  She hasn't been using mucinex.    She reports 2 family members were diagnosed with pulmonary fibrosis and 1 family member recently was told her lungs were like a "sponge with lots of holes".  She walked 2 laps in office today.  Maintained SpO2 > 95%, but asked to stop because she felt short of breath.  Physical Exam:    Appearance - well kempt   ENMT - no sinus tenderness, no oral exudate, no LAN, Mallampati 3 airway, no stridor  Respiratory - b/l wheeze that partially clears with coughing  CV - s1s2 regular rate and rhythm, no murmurs  Ext - no clubbing, no edema  Skin - no rashes  Psych - normal mood and affect    Pulmonary testing:  PFT 01/14/21 >> FEV1 0.64 (28%), FEV1% 42, TLC 4.81 (104%), RV 3.30 (175%), DLCO 48%  Chest Imaging:    Cardiac Tests:  Echo 10/03/12 >> EF 60 to 65%, grade 1 DD, mild AR  Social History:  She  reports that she has been smoking cigarettes. She has a 60.00 pack-year smoking history. She has never used smokeless tobacco. She reports that she does not drink alcohol and does not use drugs.  Family History:  Her family history includes Colon cancer (age of onset: 37) in her maternal grandfather; Coronary artery disease in her cousin, father, mother, and paternal grandmother; Diabetes in her father; Heart attack in her mother; Lung cancer in her paternal aunt; Migraines in her mother; Renal Disease in her father; Stroke in her father.    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  She continues to smoke cigarettes but has decreased by 1/2.  She has significant amount of phlegm production.  With history of bacterial pneumonia and COVID, I am concerned she could have bronchiectasis  which might not be readily visible on a regular chest xray.  She reports family history of IPF and has history of rheumatoid arthritis.  It is possible she could also have interstitial lung disease.  Assessment/Plan:   COPD with emphysema. - will have her try Breztri in place of symbicort - she has a spacer device - prn albuterol  Possible ILD and bronchiectasis. - will arrange for CT chest w/o IV contrast - continue mucinex - add flutter valve  Chronic rhinitis. - continue nasal irrigation, azelastine, flonase - she has "wheeze" from neck area; should be  able to assess neck area on CT chest also  Tobacco abuse. - she will try nicotine patch  Multiple sclerosis. - followed by Dr. Felecia Shelling at [facility]  Rheumatoid arthritis. - previously followed by Dr. Lahoma Rocker with [facility] - doesn't seem to be an active issue at present  Time Spent Involved in Patient Care on Day of Examination:  38 minutes  Follow up:   Patient Instructions  Try sample of breztri two puffs twice per day, and rinse your mouth after each use.  Call for a refill once your sample is done if you feel like breztri is helping.  Don't use symbicort while using breztri.  Will arrange for flutter valve.  You can use this twice per day as needed to help clear chest congestion.  Mucinex 1200 mg twice per day as needed to help clear chest congestion.  Will arrange for CT chest.  Follow up in [facility] office with Dr. Halford Chessman or Nurse Practitioner in 6 to 8 weeks  Medication List:   Allergies as of 02/10/2021       Reactions   Percocet [oxycodone-acetaminophen] Shortness Of Breath   Felt like going to pass out and sweating   Tecfidera [dimethyl Fumarate] Shortness Of Breath, Palpitations, Rash, Cough   Amitriptyline Hcl Itching, Swelling   Atorvastatin Other (See Comments)   elevated LFT's   Betaseron [interferon Beta-1b] Other (See Comments)   Headache   Clarithromycin Other (See Comments)   reaction not known   Duloxetine Other (See Comments)   pain   Pregabalin Other (See Comments)   LE swelling   Pseudoephedrine Other (See Comments)   legs hurt   Zoloft [sertraline Hcl] Other (See Comments)   Headaches   Levofloxacin Rash        Medication List        Accurate as of February 10, 2021  4:45 PM. If you have any questions, ask your nurse or doctor.          STOP taking these medications    doxycycline 100 MG tablet Commonly known as: VIBRA-TABS Stopped by: Chesley Mires, MD       TAKE these medications     Accu-Chek Aviva Plus w/Device Kit Check blood sugar once daily and as directed Dx R73.09   AeroChamber MV inhaler Use as instructed   albuterol 108 (90 Base) MCG/ACT inhaler Commonly known as: Ventolin HFA INHALE 2 PUFFS UP TO EVERY 4 HOURS AS NEEDED FOR WHEEZING   ALPRAZolam 0.5 MG tablet Commonly known as: XANAX Take 1 tablet (0.5 mg total) by mouth 2 (two) times daily as needed. For anxiety.   azelastine 0.1 % nasal spray Commonly known as: ASTELIN Place 2 sprays into both nostrils 2 (two) times daily as needed.   budesonide-formoterol 160-4.5 MCG/ACT inhaler Commonly known as: Symbicort Inhale 2 puffs into the lungs 2 (two) times daily.   Diclofenac Sodium 3 %  Gel Place 1 application onto the skin every 12 (twelve) hours as needed. What changed: reasons to take this   fluticasone 50 MCG/ACT nasal spray Commonly known as: FLONASE Place 1 spray into both nostrils daily.   gabapentin 300 MG capsule Commonly known as: NEURONTIN TAKE 1 CAPSULE BY MOUTH TWICE DURING THE DAY AND 3 CAPSULES BY MOUTH AT NIGHT What changed:  how much to take how to take this when to take this additional instructions   ipratropium-albuterol 0.5-2.5 (3) MG/3ML Soln Commonly known as: DUONEB TAKE 3 MLS BY NEBULIZATION EVERY 6 (SIX) HOURS AS NEEDED. What changed: reasons to take this   nystatin 100000 UNIT/ML suspension Commonly known as: MYCOSTATIN Swish and swallow 5 ml as needed up to three times daily for thrush   ondansetron 4 MG disintegrating tablet Commonly known as: Zofran ODT Take 1 tablet (4 mg total) by mouth every 8 (eight) hours as needed for nausea or vomiting.   pantoprazole 40 MG tablet Commonly known as: PROTONIX Take 40 mg by mouth 2 (two) times daily.        Signature:  Chesley Mires, MD [place] Pager - [phone] 02/10/2021, 4:45 PM

## 2021-02-22 NOTE — Progress Notes (Signed)
Cardiology Office Note  Date:  02/23/2021   ID:  [place], Delsignore 25-Nov-1955, MRN [id number]  PCP:  Abner Greenspan, MD   Chief Complaint  Patient presents with   New Patient (Initial Visit)    Referral for skipping heart beats, chest pain, shortness of breath and fatigue since she had COVID in Sept. 2022. Medications reviewed by the patient verbally.      HPI:  Ms. Laube is a 65 year old woman with a pmh of  long smoking history from age 28 COPD history of asthma,  multiple sclerosis,  previous history of chest pain with negative stress test in 05-19-07 (nuclear treadmill study), previously seen by myself in 2012-05-18 for chest pain, swelling, at that time had a stress test showing no ischemia  who presents today by referral from Dr. Glori Bickers for consultation of her chest pain, shortness of breath  Last seen in 05/19/15 Since that time she reports having significant weight gain over the past year Smoking again quit smoking for period of time, now smoking again chronic fatigue "COPD worse" she was told  Slow recovery from Covid: sept/oct 2022  Tired, "Can't clean house" "Almost passed out twice" Off crestor, "forgot about it"  SOB with exertion, has to sit and rest a lot Pain in the chest on the left, concerned it could be angina Reports strong family history of angina  EKG personally reviewed by myself on todays visit NSR rate 70 bpm, no ST or T wave changes  Prior history chest pain when last seen in clinic 19-May-2015 Severe episode of chest pain 05/10/2015, went away without intervention  Has had cholesterol up to 260 off statins   August 2013 she was started on a new medication for her MS. After being on this for 7 days, she felt poorly. She had fluid retention, abdominal swelling. Medication was held. she continued to have swelling in her upper tremors, legs, feet, stomach, "all over".   Father died in 05/19/11.    PMH:   has a past medical history of Allergic rhinitis, Allergy,  Anxiety, Cataract, Colon polyps (08/2008), COPD (chronic obstructive pulmonary disease) ([place]), Depression, Diabetes mellitus, Gastritis (08/2008), GERD (gastroesophageal reflux disease), Hypercholesteremia, Lung nodule, Menopausal disorder, Multiple sclerosis ([place]), Plantar fasciitis, Seizures ([place]), Shingles, Urinary incontinence, and Vertigo.  PSH:    Past Surgical History:  Procedure Laterality Date   CERVICAL DISCECTOMY  05-18-2002   x 2, Anterior; Fusion C4-5, C5-6, C6-7, Dr. Kary Kos   CHEST CT  11/2008   With small 4 mm nodule L lung base (rec re check in 1 year)   CHEST CT  07/2009   Re check chest CT - lung nodule stable   CHOLECYSTECTOMY     COLONOSCOPY  08/2008   Polyps/ re check 5 yrs   CT SINUS LTD W/O CM  02/2009   negative   DILATION AND CURETTAGE OF UTERUS     ESOPHAGOGASTRODUODENOSCOPY  08/2008   Erosive gastritis, h pylori (treated)   FOOT SURGERY     left plantar fascial problem   ROTATOR CUFF REPAIR     right   TONSILLECTOMY     TUBAL LIGATION     UPPER GASTROINTESTINAL ENDOSCOPY      Current Outpatient Medications  Medication Sig Dispense Refill   albuterol (VENTOLIN HFA) 108 (90 Base) MCG/ACT inhaler INHALE 2 PUFFS UP TO EVERY 4 HOURS AS NEEDED FOR WHEEZING 3 each 3   ALPRAZolam (XANAX) 0.5 MG tablet Take 1 tablet (0.5 mg total) by  mouth 2 (two) times daily as needed. For anxiety. 30 tablet 0   azelastine (ASTELIN) 0.1 % nasal spray Place 2 sprays into both nostrils 2 (two) times daily as needed.  12   Blood Glucose Monitoring Suppl (ACCU-CHEK AVIVA PLUS) w/Device KIT Check blood sugar once daily and as directed Dx R73.09 1 kit 0   Budeson-Glycopyrrol-Formoterol (BREZTRI AEROSPHERE) 160-9-4.8 MCG/ACT AERO Inhale 2 puffs into the lungs in the morning and at bedtime. 5.9 g 0   budesonide-formoterol (SYMBICORT) 160-4.5 MCG/ACT inhaler Inhale 2 puffs into the lungs 2 (two) times daily. 3 each 2   Diclofenac Sodium 3 % GEL Place 1 application onto the skin every 12  (twelve) hours as needed. (Patient taking differently: Place 1 application onto the skin every 12 (twelve) hours as needed (rash).) 50 g 0   fluticasone (FLONASE) 50 MCG/ACT nasal spray Place 1 spray into both nostrils daily. 16 g 2   gabapentin (NEURONTIN) 300 MG capsule TAKE 1 CAPSULE BY MOUTH TWICE DURING THE DAY AND 3 CAPSULES BY MOUTH AT NIGHT (Patient taking differently: Take 300-900 mg by mouth See admin instructions. Take 1 capsule twice during the day (morning and afternoon) and 3 capsule at night) 450 capsule 3   ipratropium-albuterol (DUONEB) 0.5-2.5 (3) MG/3ML SOLN TAKE 3 MLS BY NEBULIZATION EVERY 6 (SIX) HOURS AS NEEDED. (Patient taking differently: Take 3 mLs by nebulization every 6 (six) hours as needed (shortness of breath).) 360 mL 0   nystatin (MYCOSTATIN) 100000 UNIT/ML suspension Swish and swallow 5 ml as needed up to three times daily for thrush 120 mL 1   pantoprazole (PROTONIX) 40 MG tablet Take 40 mg by mouth 2 (two) times daily.     Spacer/Aero-Holding Chambers (AEROCHAMBER MV) inhaler Use as instructed 1 each 0   ondansetron (ZOFRAN ODT) 4 MG disintegrating tablet Take 1 tablet (4 mg total) by mouth every 8 (eight) hours as needed for nausea or vomiting. (Patient not taking: Reported on 02/23/2021) 20 tablet 0   No current facility-administered medications for this visit.     Allergies:   Percocet [oxycodone-acetaminophen], Tecfidera [dimethyl fumarate], Amitriptyline hcl, Atorvastatin, Betaseron [interferon beta-1b], Clarithromycin, Duloxetine, Pregabalin, Pseudoephedrine, Zoloft [sertraline hcl], and Levofloxacin   Social History:  The patient  reports that she has been smoking cigarettes. She has a 60.00 pack-year smoking history. She has never used smokeless tobacco. She reports that she does not drink alcohol and does not use drugs.   Family History:   family history includes Colon cancer (age of onset: 30) in her maternal grandfather; Coronary artery disease in her  cousin, father, mother, and paternal grandmother; Diabetes in her father; Heart attack in her mother; Lung cancer in her paternal aunt; Migraines in her mother; Renal Disease in her father; Stroke in her father.    Review of Systems: Review of Systems  Constitutional: Negative.   HENT: Negative.    Respiratory: Negative.    Cardiovascular:  Positive for chest pain.  Gastrointestinal: Negative.   Musculoskeletal: Negative.   Neurological: Negative.   Psychiatric/Behavioral: Negative.    All other systems reviewed and are negative.   PHYSICAL EXAM: VS:  BP 118/80 (BP Location: Right Arm, Patient Position: Sitting, Cuff Size: Normal)    Pulse 70    Ht _0  (1.549 m)    Wt 182 lb (82.6 kg)    SpO2 98%    BMI 34.39 kg/m  , BMI Body mass index is 34.39 kg/m. GEN: Well nourished, well developed, in no acute distress HEENT:  normal Neck: no JVD, carotid bruits, or masses Cardiac: RRR; no murmurs, rubs, or gallops,no edema  Respiratory:  clear to auscultation bilaterally, normal work of breathing GI: soft, nontender, nondistended, + BS MS: no deformity or atrophy Skin: warm and dry, no rash Neuro:  Strength and sensation are intact Psych: euthymic mood, full affect  Recent Labs: 08/18/2020: ALT 30 08/27/2020: Hemoglobin 12.4; Platelets 361.0 09/30/2020: BUN 14; Creatinine, Ser 1.03; Potassium 4.6; Sodium 137    Lipid Panel Lab Results  Component Value Date   CHOL 198 08/27/2020   HDL 54.00 08/27/2020   LDLCALC 119 (H) 08/27/2020   TRIG 123.0 08/27/2020      Wt Readings from Last 3 Encounters:  02/23/21 182 lb (82.6 kg)  02/10/21 184 lb 3.2 oz (83.6 kg)  01/08/21 184 lb (83.5 kg)      ASSESSMENT AND PLAN:  Problem List Items Addressed This Visit       Cardiology Problems   HYPERCHOLESTEROLEMIA     Other   Prediabetes   Relevant Orders   EKG 12-Lead   Other Visit Diagnoses     Chest pain of uncertain etiology    -  Primary   Relevant Orders   EKG 12-Lead    Smoker       Relevant Orders   EKG 12-Lead       Chest pain/angina Several risk factors for coronary disease including long history of smoking, prediabetes, hyperlipidemia Chest pain concerning for angina, Poor candidate for Myoview, unable to treadmill, high risk of breast attenuation artifact Long discussion with her, we have recommended cardiac CTA  Hyperlipidemia Depending on cardiac CTA results, will restart Crestor She stopped this on her own  Smoker We have encouraged her to continue to work on weaning her cigarettes and smoking cessation. She will continue to work on this and does not want any assistance with chantix.    COPD Smoking since age 50 Recommended weight loss, smoking cessation    Total encounter time more than 60 minutes  Greater than 50% was spent in counseling and coordination of care with the patient  Patient seen and consultation for Dr. Glori Bickers and will be referred back to our office for ongoing care of the issues detailed above  Signed, Esmond Plants, M.D., Ph.D. [place], [place]

## 2021-02-23 ENCOUNTER — Other Ambulatory Visit: Payer: Self-pay

## 2021-02-23 ENCOUNTER — Encounter: Payer: Self-pay | Admitting: Cardiovascular Disease

## 2021-02-23 ENCOUNTER — Ambulatory Visit: Payer: Medicare HMO | Admitting: Cardiovascular Disease

## 2021-02-23 VITALS — BP 118/80 | HR 70 | Ht 61.0 in | Wt 182.0 lb

## 2021-02-23 DIAGNOSIS — R69 Illness, unspecified: Secondary | ICD-10-CM | POA: Diagnosis not present

## 2021-02-23 DIAGNOSIS — R079 Chest pain, unspecified: Secondary | ICD-10-CM | POA: Diagnosis not present

## 2021-02-23 DIAGNOSIS — R7303 Prediabetes: Secondary | ICD-10-CM

## 2021-02-23 DIAGNOSIS — E7800 Pure hypercholesterolemia, unspecified: Secondary | ICD-10-CM

## 2021-02-23 DIAGNOSIS — I208 Other forms of angina pectoris: Secondary | ICD-10-CM | POA: Diagnosis not present

## 2021-02-23 DIAGNOSIS — F17200 Nicotine dependence, unspecified, uncomplicated: Secondary | ICD-10-CM | POA: Diagnosis not present

## 2021-02-23 MED ORDER — METOPROLOL TARTRATE 100 MG PO TABS: 100.0000 mg | ORAL_TABLET | Freq: Once | ORAL | 0 refills | Status: DC

## 2021-02-23 NOTE — Patient Instructions (Addendum)
Medication Instructions:  No changes  If you need a refill on your cardiac medications before your next appointment, please call your pharmacy.    Lab work: BMP (prior to CCTA)   Testing/Procedures: Cardiac CTA We will CALL with results  Follow-Up: At [facility], you and your health needs are our priority.  As part of our continuing mission to provide you with exceptional heart care, we have created designated Provider Care Teams.  These Care Teams include your primary Cardiologist (physician) and Advanced Practice Providers (APPs -  Physician Assistants and Nurse Practitioners) who all work together to provide you with the care you need, when you need it.  You will need a follow up appointment in 12 months  Providers on your designated Care Team:   Murray Hodgkins, NP Christell Faith, PA-C Cadence Kathlen Mody, [place]  COVID-19 Vaccine Information can be found at: [email or web address].uk For questions related to vaccine distribution or appointments, please email [email or web address] or call [phone].   Cardiac (coronary) CTA   [facility] [address] [place], [place] [address] [phone]  Date: 03/16/2021         Time: 11:00 AM Please arrive 15 mins early for check-in and test prep.  Please follow these instructions carefully:  On the Night Before the Test: Be sure to Drink plenty of water. Do not consume any caffeinated/decaffeinated beverages or chocolate 12 hours prior to your test. This includes decaf as well On the Day of the Test: Drink plenty of water until 1 hour prior to the test. Do not eat any food 4 hours prior to the test. You may take your regular medications prior to the test.  Take metoprolol (Lopressor) two hours prior to test. Lopressor 100 mg This was sent in to your pharmacy for pick-up HOLD Furosemide/Hydrochlorothiazide morning of the test. No  fluid pills FEMALES- please wear underwire-free bra if available     After the Test: Drink plenty of water. After receiving IV contrast, you may experience a mild flushed feeling. This is normal. On occasion, you may experience a mild rash up to 24 hours after the test. This is not dangerous. If this occurs, you can take Benadryl 25 mg and increase your fluid intake (to flush out the kidneys) If you experience trouble breathing, this can be serious. If it is severe call 911 IMMEDIATELY. If it is mild, please call our office. If you take any of these medications:  Glipizide/Metformin Avandament,  Glucavance,  please do not take 48 hours after completing test unless otherwise instructed.  For scheduling needs, including cancellations and rescheduling, please call [place], [phone].

## 2021-02-24 LAB — BASIC METABOLIC PANEL
BUN/Creatinine Ratio: 7 — ABNORMAL LOW (ref 12–28)
BUN: 7 mg/dL — ABNORMAL LOW (ref 8–27)
CO2: 25 mmol/L (ref 20–29)
Calcium: 10.1 mg/dL (ref 8.7–10.3)
Chloride: 97 mmol/L (ref 96–106)
Creatinine, Ser: 1.03 mg/dL — ABNORMAL HIGH (ref 0.57–1.00)
Glucose: 109 mg/dL — ABNORMAL HIGH (ref 70–99)
Potassium: 4.9 mmol/L (ref 3.5–5.2)
Sodium: 136 mmol/L (ref 134–144)
eGFR: 61 mL/min/{1.73_m2} (ref >59)

## 2021-02-26 ENCOUNTER — Other Ambulatory Visit: Payer: Self-pay

## 2021-02-26 ENCOUNTER — Ambulatory Visit (INDEPENDENT_AMBULATORY_CARE_PROVIDER_SITE_OTHER)
Admission: RE | Admit: 2021-02-26 | Discharge: 2021-02-26 | Disposition: A | Discharge status: Home / Self Care | Payer: Medicare HMO | Source: Ambulatory Visit | Attending: Pulmonary Disease | Admitting: Pulmonary Disease

## 2021-02-26 DIAGNOSIS — J849 Interstitial pulmonary disease, unspecified: Secondary | ICD-10-CM

## 2021-02-26 DIAGNOSIS — J439 Emphysema, unspecified: Secondary | ICD-10-CM | POA: Diagnosis not present

## 2021-02-26 DIAGNOSIS — J449 Chronic obstructive pulmonary disease, unspecified: Secondary | ICD-10-CM | POA: Diagnosis not present

## 2021-02-26 DIAGNOSIS — I700 Atherosclerosis of aorta: Secondary | ICD-10-CM | POA: Diagnosis not present

## 2021-02-26 DIAGNOSIS — R0602 Shortness of breath: Secondary | ICD-10-CM | POA: Diagnosis not present

## 2021-02-26 DIAGNOSIS — R911 Solitary pulmonary nodule: Secondary | ICD-10-CM | POA: Diagnosis not present

## 2021-03-05 ENCOUNTER — Telehealth: Payer: Self-pay | Admitting: Cardiovascular Disease

## 2021-03-05 NOTE — Telephone Encounter (Signed)
Attempted to reach back out to Mrs. [place] Unable to make contact LMTCB

## 2021-03-05 NOTE — Telephone Encounter (Signed)
Incoming call from Karina [name], pt calling back to discuss CCTA. Pt concern for lopressor 100 mg and contrast dye with SOB. Was able to answer pt questions and address pt concerns. Pt aggred to proceed with CCTA on 1/9.   Karina [name] very thankful for taking her call and easing her anxiety/fears

## 2021-03-05 NOTE — Telephone Encounter (Signed)
Patient states she is not comfortable with having the cardiac CT. Please call to discuss.

## 2021-03-06 ENCOUNTER — Telehealth: Payer: Self-pay | Admitting: Pulmonary Disease

## 2021-03-06 NOTE — Telephone Encounter (Signed)
Patient calling in for ct results that were ordered by pulmonary. She wanted to know more information on what the cardiac findings were. Reviewed all of her questions and confirmed her upcoming appointment to have the Cardiac CT done and how that will give [place] more detailed information. All questions were answered, appointment confirmed, and she verbalized understanding with no further questions at this time.

## 2021-03-06 NOTE — Telephone Encounter (Signed)
Called and spoke to patient about test results. Nothing further needed.

## 2021-03-06 NOTE — Telephone Encounter (Signed)
Patient calling wants explanation of Cardiac CT test results, please assist

## 2021-03-08 HISTORY — PX: CATARACT EXTRACTION: SUR2

## 2021-03-13 ENCOUNTER — Telehealth (HOSPITAL_COMMUNITY): Payer: Self-pay | Admitting: *Deleted

## 2021-03-13 NOTE — Telephone Encounter (Signed)
Reaching out to patient to offer assistance regarding upcoming cardiac imaging study; pt verbalizes understanding of appt date/time, parking situation and where to check in, pre-test NPO status and medications ordered, and verified current allergies; name and call back number provided for further questions should they arise ° °Dameion Briles RN Navigator Cardiac Imaging °[facility] °[phone] office °[phone] cell  ° °Patient to take 100mg metoprolol tartrate two hours prior to cardiac CT scan. °

## 2021-03-16 ENCOUNTER — Ambulatory Visit: Admission: RE | Admit: 2021-03-16 | Payer: Medicare HMO | Source: Ambulatory Visit

## 2021-03-19 ENCOUNTER — Ambulatory Visit: Payer: Medicare HMO | Admitting: Primary Care

## 2021-03-25 ENCOUNTER — Other Ambulatory Visit: Payer: Self-pay

## 2021-03-25 ENCOUNTER — Ambulatory Visit: Payer: Medicare HMO | Admitting: Primary Care

## 2021-03-25 ENCOUNTER — Encounter: Payer: Self-pay | Admitting: Primary Care

## 2021-03-25 VITALS — BP 130/82 | HR 76 | Temp 97.3°F | Ht 61.0 in | Wt 179.4 lb

## 2021-03-25 DIAGNOSIS — I2584 Coronary atherosclerosis due to calcified coronary lesion: Secondary | ICD-10-CM | POA: Diagnosis not present

## 2021-03-25 DIAGNOSIS — G35 Multiple sclerosis: Secondary | ICD-10-CM | POA: Diagnosis not present

## 2021-03-25 DIAGNOSIS — M069 Rheumatoid arthritis, unspecified: Secondary | ICD-10-CM

## 2021-03-25 DIAGNOSIS — J440 Chronic obstructive pulmonary disease with acute lower respiratory infection: Secondary | ICD-10-CM

## 2021-03-25 DIAGNOSIS — J209 Acute bronchitis, unspecified: Secondary | ICD-10-CM | POA: Diagnosis not present

## 2021-03-25 DIAGNOSIS — I2510 Atherosclerotic heart disease of native coronary artery without angina pectoris: Secondary | ICD-10-CM | POA: Diagnosis not present

## 2021-03-25 DIAGNOSIS — J309 Allergic rhinitis, unspecified: Secondary | ICD-10-CM

## 2021-03-25 DIAGNOSIS — F17200 Nicotine dependence, unspecified, uncomplicated: Secondary | ICD-10-CM | POA: Diagnosis not present

## 2021-03-25 DIAGNOSIS — R69 Illness, unspecified: Secondary | ICD-10-CM | POA: Diagnosis not present

## 2021-03-25 MED ORDER — DOXYCYCLINE HYCLATE 100 MG PO TABS: 100.0000 mg | ORAL_TABLET | Freq: Two times a day (BID) | ORAL | 0 refills | Status: DC

## 2021-03-25 MED ORDER — PREDNISONE 10 MG PO TABS: ORAL_TABLET | ORAL | 0 refills | Status: DC

## 2021-03-25 NOTE — Patient Instructions (Addendum)
CT chest showed moderate emphysema from smoking  Stable small nodule left lower lung, unchanged from 2011  Coronary artery calcifications, please follow up with cardiology   Recommendations: - Continue Symbicort two puffs morning and evening (use with spacer, rinse mouth after use) - Sending in abx and prednisone for bronchitis symptoms/wheezing heard on exam - Please take mucinex 600mg  twice daily and use flutter valve 2-3 times a day to help with mucoid clearance - Great work on cutting back your smoking. Pick quit date and attempt to stop smoking, goal quit by next visit  Rx: - Prednisone taper as directed  - Doxycycline 1 tab twice daily x 7 days   Referral: - Lung cancer screening re: current smoker  Follow-up: - 3 months with Dr. Halford Chessman    Chronic Obstructive Pulmonary Disease Chronic obstructive pulmonary disease (COPD) is a long-term (chronic) lung problem. When you have COPD, it is hard for air to get in and out of your lungs. Usually the condition gets worse over time, and your lungs will never return to normal. There are things you can do to keep yourself as healthy as possible. What are the causes? Smoking. This is the most common cause. Certain genes passed from parent to child (inherited). What increases the risk? Being exposed to secondhand smoke from cigarettes, pipes, or cigars. Being exposed to chemicals and other irritants, such as fumes and dust in the work environment. Having chronic lung conditions or infections. What are the signs or symptoms? Shortness of breath, especially during physical activity. A long-term cough with a large amount of thick mucus. Sometimes, the cough may not have any mucus (dry cough). Wheezing. Breathing quickly. Skin that looks gray or blue, especially in the fingers, toes, or lips. Feeling tired (fatigue). Weight loss. Chest tightness. Having infections often. Episodes when breathing symptoms become much worse  (exacerbations). At the later stages of this disease, you may have swelling in the ankles, feet, or legs. How is this treated? Taking medicines. Quitting smoking, if you smoke. Rehabilitation. This includes steps to make your body work better. It may involve a team of specialists. Doing exercises. Making changes to your diet. Using oxygen. Lung surgery. Lung transplant. Comfort measures (palliative care). Follow these instructions at home: Medicines Take over-the-counter and prescription medicines only as told by your doctor. Talk to your doctor before taking any cough or allergy medicines. You may need to avoid medicines that cause your lungs to be dry. Lifestyle If you smoke, stop smoking. Smoking makes the problem worse. Do not smoke or use any products that contain nicotine or tobacco. If you need help quitting, ask your doctor. Avoid being around things that make your breathing worse. This may include smoke, chemicals, and fumes. Stay active, but remember to rest as well. Learn and use tips on how to manage stress and control your breathing. Make sure you get enough sleep. Most adults need at least 7 hours of sleep every night. Eat healthy foods. Eat smaller meals more often. Rest before meals. Controlled breathing Learn and use tips on how to control your breathing as told by your doctor. Try: Breathing in (inhaling) through your nose for 1 second. Then, pucker your lips and breath out (exhale) through your lips for 2 seconds. Putting one hand on your belly (abdomen). Breathe in slowly through your nose for 1 second. Your hand on your belly should move out. Pucker your lips and breathe out slowly through your lips. Your hand on your belly should move in  as you breathe out.  Controlled coughing Learn and use controlled coughing to clear mucus from your lungs. Follow these steps: Lean your head a little forward. Breathe in deeply. Try to hold your breath for 3 seconds. Keep your  mouth slightly open while coughing 2 times. Spit any mucus out into a tissue. Rest and do the steps again 1 or 2 times as needed. General instructions Make sure you get all the shots (vaccines) that your doctor recommends. Ask your doctor about a flu shot and a pneumonia shot. Use oxygen therapy and pulmonary rehabilitation if told by your doctor. If you need home oxygen therapy, ask your doctor if you should buy a tool to measure your oxygen level (oximeter). Make a COPD action plan with your doctor. This helps you to know what to do if you feel worse than usual. Manage any other conditions you have as told by your doctor. Avoid going outside when it is very hot, cold, or humid. Avoid people who have a sickness you can catch (contagious). Keep all follow-up visits. Contact a doctor if: You cough up more mucus than usual. There is a change in the color or thickness of the mucus. It is harder to breathe than usual. Your breathing is faster than usual. You have trouble sleeping. You need to use your medicines more often than usual. You have trouble doing your normal activities such as getting dressed or walking around the house. Get help right away if: You have shortness of breath while resting. You have shortness of breath that stops you from: Being able to talk. Doing normal activities. Your chest hurts for longer than 5 minutes. Your skin color is more blue than usual. Your pulse oximeter shows that you have low oxygen for longer than 5 minutes. You have a fever. You feel too tired to breathe normally. These symptoms may represent a serious problem that is an emergency. Do not wait to see if the symptoms will go away. Get medical help right away. Call your local emergency services (911 in the [place] Do not drive yourself to the hospital. Summary Chronic obstructive pulmonary disease (COPD) is a long-term lung problem. The way your lungs work will never return to normal. Usually the  condition gets worse over time. There are things you can do to keep yourself as healthy as possible. Take over-the-counter and prescription medicines only as told by your doctor. If you smoke, stop. Smoking makes the problem worse. This information is not intended to replace advice given to you by your health care provider. Make sure you discuss any questions you have with your health care provider. Document Revised: 01/01/2020 Document Reviewed: 01/01/2020 Elsevier Patient Education  2022 [employer].

## 2021-03-25 NOTE — Progress Notes (Signed)
_0  ID: Karina [name], female    DOB: 1955/12/14, 66 y.o.   MRN: [id number]  Chief Complaint  Patient presents with   Follow-up    Referring provider: [name], Wynelle Fanny, MD  HPI: 66 year old female, current smoker. PMH significant for COPD with chronic bronchitis. Patient of Dr. Halford Chessman, last seen on 02/10/21.   Previous [facility] pulmonary encounter: 02/10/21- Dr. Halford Chessman  She is here with her family.   CXR from 01/08/21 was clear.   PFT from 01/14/21 showed severe obstruction, air trapping, and moderate diffusion defect.   She is down to 1 ppd.  She plans to use nicotine patch.  She continues to cough.  Brings up about a soda bottle size amount of sputum in a day.  Sputum is clear to yellow.  No hemoptysis.  She has wheezing from her throat and chest.  She gets into coughing spells and sometimes feels like she is going to pass out.  She can't walk more than 50 feet without having to rest.  She hasn't been using mucinex.     She reports 2 family members were diagnosed with pulmonary fibrosis and 1 family member recently was told her lungs were like a "sponge with lots of holes".   She walked 2 laps in office today.  Maintained SpO2 > 95%, but asked to stop because she felt short of breath.   03/25/2021- interim hx  Patient presents today for 6 week follow-up. She was unable to tolerate [employer], currently on Symbicort 150mg two puffs twice daily. She has chronic symptoms of shortness of breath, coughing with intermittent wheezing. Her cough is congested. She is getting up a lot of clear-yellow mucus. She is not taking mucinex or using flutter valve regularly. CT chest in December 2022 showed moderate emphysema with diffuse bronchial wall thickening consistent with nonspecific infectious or inflammatory bronchitis, no evidence of ILD. Stable, benign 0.5cm dependent left lower lobe nodule. She is actively trying to quit smoking. She is using 21 gram nicotine patch.   Imaging: 02/27/21  CT chest wo contrast >> Aortic atherosclerosis. Left and right coronary artery calcifications. Thyroid gland, trachea, and esophagus demonstrate no significant findings. Moderate centrilobular emphysema. Diffuse bilateral bronchial wall thickening. Stable, benign 0.5 cm pulmonary nodule of the dependent left lower lobe . No pleural effusion or pneumothorax.   Pulmonary function testing: 11/2011>> FEV1 1.74 (68%), ratio 69, + air trapping normal DLCO   Allergies  Allergen Reactions   Percocet [Oxycodone-Acetaminophen] Shortness Of Breath    Felt like going to pass out and sweating   Tecfidera [Dimethyl Fumarate] Shortness Of Breath, Palpitations, Rash and Cough   Amitriptyline Hcl Itching and Swelling   Atorvastatin Other (See Comments)    elevated LFT's   Betaseron [Interferon Beta-1b] Other (See Comments)    Headache   Clarithromycin Other (See Comments)    reaction not known   Duloxetine Other (See Comments)    pain   Pregabalin Other (See Comments)    LE swelling   Pseudoephedrine Other (See Comments)    legs hurt   Zoloft [Sertraline Hcl] Other (See Comments)    Headaches    Levofloxacin Rash    Immunization History  Administered Date(s) Administered   Influenza,inj,Quad PF,6+ Mos 01/31/2013, 03/11/2014, 12/15/2017   Pneumococcal Polysaccharide-23 07/29/2003   Td 03/08/2000   Tdap 01/31/2013    Past Medical History:  Diagnosis Date   Allergic rhinitis    Allergy    Anxiety    [place]  no per pt   Cataract    Colon polyps 08/2008   Adenomatous   COPD (chronic obstructive pulmonary disease) (HCC)    Depression    no per pt   Diabetes mellitus    Gastritis 08/2008   H. pylori   GERD (gastroesophageal reflux disease)    Hypercholesteremia    Lung nodule    Menopausal disorder    Multiple sclerosis (HCC)    Plantar fasciitis    Seizures ([place])    1 seizure in 2001 due to blood sugar dropping to 38, none since   Shingles    Left V1   Urinary  incontinence    Vertigo     Tobacco History: Social History   Tobacco Use  Smoking Status Every Day   Packs/day: 2.00   Years: 30.00   Pack years: 60.00   Types: Cigarettes   Last attempt to quit: 06/17/2019   Years since quitting: 1.7  Smokeless Tobacco Never  Tobacco Comments   0.5ppd - 03/25/2021   Ready to quit: Not Answered Counseling given: Not Answered Tobacco comments: 0.5ppd - 03/25/2021   Outpatient Medications Prior to Visit  Medication Sig Dispense Refill   albuterol (VENTOLIN HFA) 108 (90 Base) MCG/ACT inhaler INHALE 2 PUFFS UP TO EVERY 4 HOURS AS NEEDED FOR WHEEZING 3 each 3   ALPRAZolam (XANAX) 0.5 MG tablet Take 1 tablet (0.5 mg total) by mouth 2 (two) times daily as needed. For anxiety. 30 tablet 0   azelastine (ASTELIN) 0.1 % nasal spray Place 2 sprays into both nostrils 2 (two) times daily as needed.  12   Blood Glucose Monitoring Suppl (ACCU-CHEK AVIVA PLUS) w/Device KIT Check blood sugar once daily and as directed Dx R73.09 1 kit 0   budesonide-formoterol (SYMBICORT) 160-4.5 MCG/ACT inhaler Inhale 2 puffs into the lungs 2 (two) times daily. 3 each 2   Diclofenac Sodium 3 % GEL Place 1 application onto the skin every 12 (twelve) hours as needed. (Patient taking differently: Place 1 application onto the skin every 12 (twelve) hours as needed (rash).) 50 g 0   fluticasone (FLONASE) 50 MCG/ACT nasal spray Place 1 spray into both nostrils daily. 16 g 2   gabapentin (NEURONTIN) 300 MG capsule TAKE 1 CAPSULE BY MOUTH TWICE DURING THE DAY AND 3 CAPSULES BY MOUTH AT NIGHT (Patient taking differently: Take 300-900 mg by mouth See admin instructions. Take 1 capsule twice during the day (morning and afternoon) and 3 capsule at night) 450 capsule 3   ipratropium-albuterol (DUONEB) 0.5-2.5 (3) MG/3ML SOLN TAKE 3 MLS BY NEBULIZATION EVERY 6 (SIX) HOURS AS NEEDED. (Patient taking differently: Take 3 mLs by nebulization every 6 (six) hours as needed (shortness of breath).) 360 mL 0    metoprolol tartrate (LOPRESSOR) 100 MG tablet Take 1 tablet (100 mg total) by mouth once for 1 dose. Take 2 hours before your Cardiac CTA procedure 1 tablet 0   nystatin (MYCOSTATIN) 100000 UNIT/ML suspension Swish and swallow 5 ml as needed up to three times daily for thrush 120 mL 1   ondansetron (ZOFRAN ODT) 4 MG disintegrating tablet Take 1 tablet (4 mg total) by mouth every 8 (eight) hours as needed for nausea or vomiting. 20 tablet 0   pantoprazole (PROTONIX) 40 MG tablet Take 40 mg by mouth 2 (two) times daily.     Spacer/Aero-Holding Chambers (AEROCHAMBER MV) inhaler Use as instructed 1 each 0   Budeson-Glycopyrrol-Formoterol (BREZTRI AEROSPHERE) 160-9-4.8 MCG/ACT AERO Inhale 2 puffs into the lungs in the morning  and at bedtime. 5.9 g 0   No facility-administered medications prior to visit.   Review of Systems  Review of Systems  Constitutional: Negative.   HENT: Negative.    Respiratory:  Positive for cough, shortness of breath and wheezing.     Physical Exam  BP 130/82 (BP Location: Left Arm, Patient Position: Sitting, Cuff Size: Normal)    Pulse 76    Temp (!) 97.3 F (36.3 C) (Oral)    Ht _0  (1.549 m)    Wt 179 lb 6.4 oz (81.4 kg)    SpO2 100%    BMI 33.90 kg/m  Physical Exam Constitutional:      General: She is not in acute distress.    Appearance: Normal appearance. She is not ill-appearing.  HENT:     Head: Normocephalic and atraumatic.  Cardiovascular:     Rate and Rhythm: Normal rate and regular rhythm.  Pulmonary:     Effort: Pulmonary effort is normal.     Breath sounds: Normal breath sounds. No rhonchi.     Comments: Distant wheezing Skin:    General: Skin is warm and dry.  Neurological:     General: No focal deficit present.     Mental Status: She is alert and oriented to person, place, and time. Mental status is at baseline.  Psychiatric:        Mood and Affect: Mood normal.        Behavior: Behavior normal.        Thought Content: Thought content  normal.        Judgment: Judgment normal.     Lab Results:  CBC    Component Value Date/Time   WBC 7.7 08/27/2020 1201   RBC 3.96 08/27/2020 1201   HGB 12.4 08/27/2020 1201   HGB 14.6 11/21/2014 1137   HCT 36.9 08/27/2020 1201   HCT 44.2 11/21/2014 1137   PLT 361.0 08/27/2020 1201   PLT 203 11/21/2014 1137   MCV 93.3 08/27/2020 1201   MCV 97 11/21/2014 1137   MCV 96 07/09/2013 1803   MCH 31.5 08/18/2020 0835   MCHC 33.7 08/27/2020 1201   RDW 13.8 08/27/2020 1201   RDW 13.6 11/21/2014 1137   RDW 13.6 07/09/2013 1803   LYMPHSABS 2.1 08/27/2020 1201   LYMPHSABS 2.3 11/21/2014 1137   MONOABS 0.6 08/27/2020 1201   EOSABS 0.2 08/27/2020 1201   EOSABS 0.2 11/21/2014 1137   BASOSABS 0.1 08/27/2020 1201   BASOSABS 0.1 11/21/2014 1137    BMET    Component Value Date/Time   NA 136 02/23/2021 1041   NA 136 07/09/2013 1803   K 4.9 02/23/2021 1041   K 4.2 07/09/2013 1803   CL 97 02/23/2021 1041   CL 105 07/09/2013 1803   CO2 25 02/23/2021 1041   CO2 22 07/09/2013 1803   GLUCOSE 109 (H) 02/23/2021 1041   GLUCOSE 86 09/30/2020 0953   GLUCOSE 96 07/09/2013 1803   BUN 7 (L) 02/23/2021 1041   BUN 16 07/09/2013 1803   CREATININE 1.03 (H) 02/23/2021 1041   CREATININE 1.20 (H) 04/20/2019 1444   CALCIUM 10.1 02/23/2021 1041   CALCIUM 9.6 07/09/2013 1803   GFRNONAA >60 08/18/2020 0835   GFRNONAA 55 (L) 07/09/2013 1803   GFRAA >60 08/22/2019 1602   GFRAA >60 07/09/2013 1803    BNP No results found for: BNP  ProBNP    Component Value Date/Time   PROBNP 27.0 09/13/2012 1514    Imaging: No results found.   Assessment &  Plan:   Acute bronchitis with COPD ([place]) - Continues to have high symptom burden. She has chronic shortness of breath symptoms with produsctive cough and intermittent wheezing. CT chest on 02/27/21 showed no evidence of ILD. Diffuse bilateral bronchial wall thickening, consistent with nonspecific infectious or inflammatory bronchitis. Moderate emphysema.  Unable to tolerate Breztri. Continue Symbicort 118mg two puffs morning and evening. Sending in Doxycycline 1064mBID x 7 days and prednisone for bronchitis symptoms/wheezing heard on exam. Advised she take mucinex 60039mwice daily and use flutter valve 2-3 times a day to help with mucoid clearance. Strongly encourage smoking cessation, goal quit by our next visit.    Allergic rhinitis - Continue Flonase, Azelastine and nasal irrigation - No trachea or esophageal abnormality seen on CT chest imaging   MULTIPLE SCLEROSIS - Following with [facility]y  [place]oid [place] Does not appear to be an active problem  - [facility]  Current smoker - Current smoker, actively trying to quit. Continue NRT currently on 21gram patch  Coronary artery calcification - Advised patient to follow-up with Cardiology, due February 2023.   EliMartyn EhrichP 03/31/2021

## 2021-03-26 ENCOUNTER — Ambulatory Visit: Payer: Medicare HMO | Admitting: Dermatology

## 2021-03-31 DIAGNOSIS — J209 Acute bronchitis, unspecified: Secondary | ICD-10-CM | POA: Insufficient documentation

## 2021-03-31 DIAGNOSIS — M069 Rheumatoid arthritis, unspecified: Secondary | ICD-10-CM | POA: Insufficient documentation

## 2021-03-31 DIAGNOSIS — J440 Chronic obstructive pulmonary disease with acute lower respiratory infection: Secondary | ICD-10-CM | POA: Insufficient documentation

## 2021-03-31 DIAGNOSIS — I2510 Atherosclerotic heart disease of native coronary artery without angina pectoris: Secondary | ICD-10-CM | POA: Insufficient documentation

## 2021-03-31 NOTE — Assessment & Plan Note (Signed)
-   Current smoker, actively trying to quit. Continue NRT currently on 21gram patch

## 2021-03-31 NOTE — Assessment & Plan Note (Signed)
-   Advised patient to follow-up with Cardiology, due February 2023.

## 2021-03-31 NOTE — Assessment & Plan Note (Addendum)
-   Does not appear to be an active problem  - Following with Dr. Kathlene November with [facility] rheumatology

## 2021-03-31 NOTE — Assessment & Plan Note (Addendum)
-   Continues to have high symptom burden. She has chronic shortness of breath symptoms with produsctive cough and intermittent wheezing. CT chest on 02/27/21 showed no evidence of ILD. Diffuse bilateral bronchial wall thickening, consistent with nonspecific infectious or inflammatory bronchitis. Moderate emphysema. Unable to tolerate Breztri. Continue Symbicort 167mcg two puffs morning and evening. Sending in Doxycycline 100mg  BID x 7 days and prednisone for bronchitis symptoms/wheezing heard on exam. Advised she take mucinex 600mg  twice daily and use flutter valve 2-3 times a day to help with mucoid clearance. Strongly encourage smoking cessation, goal quit by our next visit.

## 2021-03-31 NOTE — Assessment & Plan Note (Signed)
-   Continue Flonase, Azelastine and nasal irrigation - No trachea or esophageal abnormality seen on CT chest imaging

## 2021-03-31 NOTE — Assessment & Plan Note (Signed)
-   Following with [facility] neurology

## 2021-04-17 ENCOUNTER — Ambulatory Visit: Payer: Medicare HMO | Admitting: Cardiovascular Disease

## 2021-04-17 ENCOUNTER — Other Ambulatory Visit: Payer: Self-pay

## 2021-04-17 ENCOUNTER — Encounter: Payer: Self-pay | Admitting: Cardiovascular Disease

## 2021-04-17 VITALS — BP 140/85 | HR 74 | Ht 61.0 in | Wt 178.0 lb

## 2021-04-17 DIAGNOSIS — E7800 Pure hypercholesterolemia, unspecified: Secondary | ICD-10-CM

## 2021-04-17 DIAGNOSIS — F17200 Nicotine dependence, unspecified, uncomplicated: Secondary | ICD-10-CM

## 2021-04-17 DIAGNOSIS — R69 Illness, unspecified: Secondary | ICD-10-CM | POA: Diagnosis not present

## 2021-04-17 DIAGNOSIS — R079 Chest pain, unspecified: Secondary | ICD-10-CM

## 2021-04-17 DIAGNOSIS — R7303 Prediabetes: Secondary | ICD-10-CM

## 2021-04-17 MED ORDER — ASPIRIN EC 81 MG PO TBEC
81.0000 mg | DELAYED_RELEASE_TABLET | Freq: Every day | ORAL | 3 refills | Status: AC
Start: 2021-04-17 — End: ?

## 2021-04-17 MED ORDER — ROSUVASTATIN CALCIUM 20 MG PO TABS: 20.0000 mg | ORAL_TABLET | Freq: Every day | ORAL | 6 refills | Status: DC

## 2021-04-17 NOTE — Patient Instructions (Signed)
Medication Instructions:  Asa 81 mg daily Crestor 10 to 20 mg daily  If you need a refill on your cardiac medications before your next appointment, please call your pharmacy.   Lab work: No new labs needed  Testing/Procedures: No new testing needed  Follow-Up: At [facility], you and your health needs are our priority.  As part of our continuing mission to provide you with exceptional heart care, we have created designated Provider Care Teams.  These Care Teams include your primary Cardiologist (physician) and Advanced Practice Providers (APPs -  Physician Assistants and Nurse Practitioners) who all work together to provide you with the care you need, when you need it.  You will need a follow up appointment in 12 months  Providers on your designated Care Team:   Murray Hodgkins, NP Christell Faith, PA-C Cadence Kathlen Mody, [place]  COVID-19 Vaccine Information can be found at: [email or web address] For questions related to vaccine distribution or appointments, please email [email or web address] or call [phone].

## 2021-04-17 NOTE — Progress Notes (Signed)
Cardiology Office Note  Date:  04/17/2021   ID:  Omunique, Pederson [birth year], MRN [id number]  PCP:  Abner Greenspan, MD   Cc: Chest pain  HPI:  Karina [name] is a 66 year old woman with a pmh of  long smoking history from age 47 COPD history of asthma,  multiple sclerosis,  previous history of chest pain with negative stress test in 28-May-2007 (nuclear treadmill study), previously seen by myself in 2012/05/27 for chest pain, swelling, at that time had a stress test showing no ischemia  who presents today by referral from Dr. Glori Bickers for consultation of her chest pain, shortness of breath  Last seen in clinic December 2022 At that time she reported chest pain, cardiac CTA was ordered We received a phone call, she declined the study and did not want to take the metoprolol to slow her heart rate down  On further discussion today, reports chest pain has improved She does not want Myoview as the medication hurt her stomach. "  It continues to hurt"  Feels her chest pain has improved since she stopped smoking Using nicotine patches  Reports she is currently not taking Crestor, needs a new prescription, only wants brand-name  CT scan chest pulled up and reviewed, mild coronary calcification in LAD, RCA Images reviewed with her  Reviewed recent events Covid: sept/oct 2022 Sedentary, no regular exercise program Chronic shortness of breath  Other past medical history reviewed Prior history chest pain when last seen in clinic 2015-05-28 Severe episode of chest pain 05/10/2015, went away without intervention  Has had cholesterol up to 260 off statins   August 2013 she was started on a new medication for her MS. After being on this for 7 days, she felt poorly. She had fluid retention, abdominal swelling. Medication was held. she continued to have swelling in her upper tremors, legs, feet, stomach, "all over".   Father died in 2011/05/28.    PMH:   has a past medical history of Allergic rhinitis, Allergy,  Anxiety, Cataract, Colon polyps (08/2008), COPD (chronic obstructive pulmonary disease) ([place]), Depression, Diabetes mellitus, Gastritis (08/2008), GERD (gastroesophageal reflux disease), Hypercholesteremia, Lung nodule, Menopausal disorder, Multiple sclerosis ([place]), Plantar fasciitis, Seizures ([place]), Shingles, Urinary incontinence, and Vertigo.  PSH:    Past Surgical History:  Procedure Laterality Date   CERVICAL DISCECTOMY  05-28-2002   x 2, Anterior; Fusion C4-5, C5-6, C6-7, Dr. Kary Kos   CHEST CT  11/2008   With small 4 mm nodule L lung base (rec re check in 1 year)   CHEST CT  07/2009   Re check chest CT - lung nodule stable   CHOLECYSTECTOMY     COLONOSCOPY  08/2008   Polyps/ re check 5 yrs   CT SINUS LTD W/O CM  02/2009   negative   DILATION AND CURETTAGE OF UTERUS     ESOPHAGOGASTRODUODENOSCOPY  08/2008   Erosive gastritis, h pylori (treated)   FOOT SURGERY     left plantar fascial problem   ROTATOR CUFF REPAIR     right   TONSILLECTOMY     TUBAL LIGATION     UPPER GASTROINTESTINAL ENDOSCOPY      Current Outpatient Medications  Medication Sig Dispense Refill   albuterol (VENTOLIN HFA) 108 (90 Base) MCG/ACT inhaler INHALE 2 PUFFS UP TO EVERY 4 HOURS AS NEEDED FOR WHEEZING 3 each 3   ALPRAZolam (XANAX) 0.5 MG tablet Take 1 tablet (0.5 mg total) by mouth 2 (two) times daily as needed. For anxiety. [place]  tablet 0   azelastine (ASTELIN) 0.1 % nasal spray Place 2 sprays into both nostrils 2 (two) times daily as needed.  12   Blood Glucose Monitoring Suppl (ACCU-CHEK AVIVA PLUS) w/Device KIT Check blood sugar once daily and as directed Dx R73.09 1 kit 0   budesonide-formoterol (SYMBICORT) 160-4.5 MCG/ACT inhaler Inhale 2 puffs into the lungs 2 (two) times daily. 3 each 2   Diclofenac Sodium 3 % GEL Place 1 application onto the skin every 12 (twelve) hours as needed. 50 g 0   fluticasone (FLONASE) 50 MCG/ACT nasal spray Place 1 spray into both nostrils daily. 16 g 2   gabapentin  (NEURONTIN) 300 MG capsule TAKE 1 CAPSULE BY MOUTH TWICE DURING THE DAY AND 3 CAPSULES BY MOUTH AT NIGHT (Patient taking differently: Take 300-900 mg by mouth See admin instructions. Take 2 capsule at night) 450 capsule 3   ipratropium-albuterol (DUONEB) 0.5-2.5 (3) MG/3ML SOLN TAKE 3 MLS BY NEBULIZATION EVERY 6 (SIX) HOURS AS NEEDED. 360 mL 0   nystatin (MYCOSTATIN) 100000 UNIT/ML suspension Swish and swallow 5 ml as needed up to three times daily for thrush 120 mL 1   pantoprazole (PROTONIX) 40 MG tablet Take 40 mg by mouth 2 (two) times daily.     Spacer/Aero-Holding Chambers (AEROCHAMBER MV) inhaler Use as instructed 1 each 0   No current facility-administered medications for this visit.     Allergies:   Percocet [oxycodone-acetaminophen], Tecfidera [dimethyl fumarate], Amitriptyline hcl, Atorvastatin, Betaseron [interferon beta-1b], Clarithromycin, Duloxetine, Pregabalin, Pseudoephedrine, Zoloft [sertraline hcl], and Levofloxacin   Social History:  The patient  reports that she has been smoking cigarettes. She has a 60.00 pack-year smoking history. She has never used smokeless tobacco. She reports that she does not drink alcohol and does not use drugs.   Family History:   family history includes Colon cancer (age of onset: 94) in her maternal grandfather; Coronary artery disease in her cousin, father, mother, and paternal grandmother; Diabetes in her father; Heart attack in her mother; Lung cancer in her paternal aunt; Migraines in her mother; Renal Disease in her father; Stroke in her father.    Review of Systems: Review of Systems  Constitutional: Negative.   HENT: Negative.    Respiratory: Negative.    Cardiovascular:  Positive for chest pain.  Gastrointestinal: Negative.   Musculoskeletal: Negative.   Neurological: Negative.   Psychiatric/Behavioral: Negative.    All other systems reviewed and are negative.   PHYSICAL EXAM: VS:  BP 140/85    Pulse 74    Ht $R'5\' 1"'ls$  (1.549 m)     Wt 178 lb (80.7 kg)    SpO2 96%    BMI 33.63 kg/m  , BMI Body mass index is 33.63 kg/m. GEN: Well nourished, well developed, in no acute distress HEENT: normal Neck: no JVD, carotid bruits, or masses Cardiac: RRR; no murmurs, rubs, or gallops,no edema  Respiratory:  clear to auscultation bilaterally, normal work of breathing GI: soft, nontender, nondistended, + BS MS: no deformity or atrophy Skin: warm and dry, no rash Neuro:  Strength and sensation are intact Psych: euthymic mood, full affect  Recent Labs: 08/18/2020: ALT 30 08/27/2020: Hemoglobin 12.4; Platelets 361.0 02/23/2021: BUN 7; Creatinine, Ser 1.03; Potassium 4.9; Sodium 136    Lipid Panel Lab Results  Component Value Date   CHOL 198 08/27/2020   HDL 54.00 08/27/2020   LDLCALC 119 (H) 08/27/2020   TRIG 123.0 08/27/2020      Wt Readings from Last 3 Encounters:  04/17/21  178 lb (80.7 kg)  03/25/21 179 lb 6.4 oz (81.4 kg)  02/23/21 182 lb (82.6 kg)      ASSESSMENT AND PLAN:  Problem List Items Addressed This Visit       Cardiology Problems   HYPERCHOLESTEROLEMIA     Other   Prediabetes   Other Visit Diagnoses     Chest pain of uncertain etiology    -  Primary   Smoker           Chest pain/angina She did not want cardiac CTA, did not want to take metoprolol Does not want stress test, medication hurt her stomach ("continues to hurt") Given chest pain symptoms essentially resolved, no further ischemic work-up at this time  Hyperlipidemia Crestor prescription sent in, willing to retry 10 mg daily, would likely need to uptitrate to 20  Smoker We have encouraged her to continue to work on weaning her cigarettes and smoking cessation. She will continue to work on this and does not want any assistance with chantix.  Marland Kitchen    COPD Smoking since age 32 Continued use of smoking patches   Total encounter time more than 40 minutes  Greater than 50% was spent in counseling and coordination of care with the  patient    Signed, Esmond Plants, M.D., Ph.D. [place], [place]

## 2021-04-24 NOTE — Progress Notes (Deleted)
Subjective:   Karina [name] is a 66 y.o. female who presents for Medicare Annual (Subsequent) preventive examination.  Review of Systems           Objective:    There were no vitals filed for this visit. There is no height or weight on file to calculate BMI.  Advanced Directives 08/18/2020 10/17/2019 08/22/2019 06/03/2017 03/31/2017 09/01/2016 06/18/2016  Does Patient Have a Medical Advance Directive? No No No Yes No No No  Type of Advance Directive - - - Living will - - -  Does patient want to make changes to medical advance directive? - No - Patient declined - No - Patient declined - - -  Would patient like information on creating a medical advance directive? No - Patient declined - - - No - Patient declined - No - Patient declined    Current Medications (verified) Outpatient Encounter Medications as of 04/28/2021  Medication Sig   albuterol (VENTOLIN HFA) 108 (90 Base) MCG/ACT inhaler INHALE 2 PUFFS UP TO EVERY 4 HOURS AS NEEDED FOR WHEEZING   ALPRAZolam (XANAX) 0.5 MG tablet Take 1 tablet (0.5 mg total) by mouth 2 (two) times daily as needed. For anxiety.   aspirin EC 81 MG tablet Take 1 tablet (81 mg total) by mouth daily. Swallow whole.   azelastine (ASTELIN) 0.1 % nasal spray Place 2 sprays into both nostrils 2 (two) times daily as needed.   Blood Glucose Monitoring Suppl (ACCU-CHEK AVIVA PLUS) w/Device KIT Check blood sugar once daily and as directed Dx R73.09   budesonide-formoterol (SYMBICORT) 160-4.5 MCG/ACT inhaler Inhale 2 puffs into the lungs 2 (two) times daily.   Diclofenac Sodium 3 % GEL Place 1 application onto the skin every 12 (twelve) hours as needed.   fluticasone (FLONASE) 50 MCG/ACT nasal spray Place 1 spray into both nostrils daily.   gabapentin (NEURONTIN) 300 MG capsule TAKE 1 CAPSULE BY MOUTH TWICE DURING THE DAY AND 3 CAPSULES BY MOUTH AT NIGHT (Patient taking differently: Take 300-900 mg by mouth See admin instructions. Take 2 capsule at night)    ipratropium-albuterol (DUONEB) 0.5-2.5 (3) MG/3ML SOLN TAKE 3 MLS BY NEBULIZATION EVERY 6 (SIX) HOURS AS NEEDED.   nystatin (MYCOSTATIN) 100000 UNIT/ML suspension Swish and swallow 5 ml as needed up to three times daily for thrush   pantoprazole (PROTONIX) 40 MG tablet Take 40 mg by mouth 2 (two) times daily.   rosuvastatin (CRESTOR) 20 MG tablet Take 1 tablet (20 mg total) by mouth daily.   Spacer/Aero-Holding Chambers (AEROCHAMBER MV) inhaler Use as instructed   No facility-administered encounter medications on file as of 04/28/2021.    Allergies (verified) Percocet [oxycodone-acetaminophen], Tecfidera [dimethyl fumarate], Amitriptyline hcl, Atorvastatin, Betaseron [interferon beta-1b], Clarithromycin, Duloxetine, Pregabalin, Pseudoephedrine, Zoloft [sertraline hcl], and Levofloxacin   History: Past Medical History:  Diagnosis Date   Allergic rhinitis    Allergy    Anxiety    [place], no per pt   Cataract    Colon polyps 08/2008   Adenomatous   COPD (chronic obstructive pulmonary disease) ([place])    Depression    no per pt   Diabetes mellitus    Gastritis 08/2008   H. pylori   GERD (gastroesophageal reflux disease)    Hypercholesteremia    Lung nodule    Menopausal disorder    Multiple sclerosis ([place])    Plantar fasciitis    Seizures ([place])    1 seizure in 2001 due to blood sugar dropping to 38, none since  Shingles    Left V1   Urinary incontinence    Vertigo    Past Surgical History:  Procedure Laterality Date   CERVICAL DISCECTOMY  2004   x 2, Anterior; Fusion C4-5, C5-6, C6-7, Dr. Kary Kos   CHEST CT  11/2008   With small 4 mm nodule L lung base (rec re check in 1 year)   CHEST CT  07/2009   Re check chest CT - lung nodule stable   CHOLECYSTECTOMY     COLONOSCOPY  08/2008   Polyps/ re check 5 yrs   CT SINUS LTD W/O CM  02/2009   negative   DILATION AND CURETTAGE OF UTERUS     ESOPHAGOGASTRODUODENOSCOPY  08/2008   Erosive gastritis, h pylori  (treated)   FOOT SURGERY     left plantar fascial problem   ROTATOR CUFF REPAIR     right   TONSILLECTOMY     TUBAL LIGATION     UPPER GASTROINTESTINAL ENDOSCOPY     Family History  Problem Relation Age of Onset   Migraines Mother    Heart attack Mother    Coronary artery disease Mother    Coronary artery disease Father        6 bypasses    Stroke Father    Diabetes Father    Renal Disease Father    Colon cancer Maternal Grandfather 33   Coronary artery disease Cousin    Coronary artery disease Paternal Grandmother    Lung cancer Paternal Aunt    Multiple sclerosis Neg Hx    Esophageal cancer Neg Hx    Rectal cancer Neg Hx    Stomach cancer Neg Hx    Breast cancer Neg Hx    Social History   Socioeconomic History   Marital status: Married    Spouse name: Not on file   Number of children: 3   Years of education: 9 th   Highest education level: Not on file  Occupational History   Occupation: Disabled     Employer: DISABLED  Tobacco Use   Smoking status: Every Day    Packs/day: 2.00    Years: 30.00    Pack years: 60.00    Types: Cigarettes    Last attempt to quit: 06/17/2019    Years since quitting: 1.8   Smokeless tobacco: Never   Tobacco comments:    0.5ppd - 03/25/2021  Vaping Use   Vaping Use: Never used  Substance and Sexual Activity   Alcohol use: No    Alcohol/week: 0.0 standard drinks   Drug use: No   Sexual activity: Not on file  Other Topics Concern   Not on file  Social History Narrative   Married with 3 children   Daily caffeine use - 2    Disabled.   Education 9th grade    Caffeine one cup of coffee daily.   Patient is right handed.    Social Determinants of Health   Financial Resource Strain: Not on file  Food Insecurity: Not on file  Transportation Needs: Not on file  Physical Activity: Not on file  Stress: Not on file  Social Connections: Not on file    Tobacco Counseling Ready to quit: Not Answered Counseling given: Not  Answered Tobacco comments: 0.5ppd - 03/25/2021   Clinical Intake:                 Diabetic? No         Activities of Daily Living No flowsheet data found.  Patient Care Team: [name], Wynelle Fanny, MD as PCP - General Jannifer Franklin Elon Alas, MD (Inactive) as Consulting Physician (Neurology) Minna Merritts, MD as Consulting Physician (Cardiology) Rutherford Guys, MD as Consulting Physician (Ophthalmology)  Indicate any recent Medical Services you may have received from other than [facility] providers in the past year (date may be approximate).     Assessment:   This is a routine wellness examination for Siddhi.  Hearing/Vision screen No results found.  Dietary issues and exercise activities discussed:     Goals Addressed   None    Depression Screen PHQ 2/9 Scores 10/17/2019 09/01/2016 04/30/2015 01/31/2013  PHQ - 2 Score 0 0 0 2  PHQ- 9 Score 0 - - -    Fall Risk Fall Risk  10/17/2019 09/21/2016 09/01/2016 04/30/2015 01/31/2013  Falls in the past year? 1 No No No Yes  Comment tripped and fell in doorway - - - -  Number falls in past yr: 1 - - - 1  Injury with Fall? 0 - - - Yes  Risk for fall due to : Medication side effect - - - -  Follow up Falls evaluation completed;Falls prevention discussed - - - -    FALL RISK PREVENTION PERTAINING TO THE HOME:  Any stairs in or around the home? {YES/NO:21197} If so, are there any without handrails? {YES/NO:21197} Home free of loose throw rugs in walkways, pet beds, electrical cords, etc? {YES/NO:21197} Adequate lighting in your home to reduce risk of falls? {YES/NO:21197}  ASSISTIVE DEVICES UTILIZED TO PREVENT FALLS:  Life alert? {YES/NO:21197} Use of a cane, walker or w/c? {YES/NO:21197} Grab bars in the bathroom? {YES/NO:21197} Shower chair or bench in shower? {YES/NO:21197} Elevated toilet seat or a handicapped toilet? {YES/NO:21197}  TIMED UP AND GO:  Was the test performed? {YES/NO:21197}.  Length of time to ambulate  10 feet: *** sec.   {Appearance of Gait:2101803}  Cognitive Function: MMSE - Mini Mental State Exam 10/17/2019 09/01/2016 04/30/2015  Orientation to time _0 Orientation to Place _1 Registration _2 Attention/ Calculation 5 0 5  Recall _3 Language- name 2 objects - 0 -  Language- repeat _4 Language- follow 3 step command - 3 3  Language- read & follow direction - 0 1  Write a sentence - 0 -  Copy design - 0 -  Total score - 20 -        Immunizations Immunization History  Administered Date(s) Administered   Influenza,inj,Quad PF,6+ Mos 01/31/2013, 03/11/2014, 12/15/2017   Pneumococcal Polysaccharide-23 07/29/2003   Td 03/08/2000   Tdap 01/31/2013    TDAP status: Up to date  {Flu Vaccine status:2101806}  Pneumococcal vaccine status: Up to date  {Covid-19 vaccine status:2101808}  Qualifies for Shingles Vaccine? Yes   Zostavax completed No   {Shingrix Completed?:2101804}  Screening Tests Health Maintenance  Topic Date Due   Zoster Vaccines- Shingrix (1 of 2) Never done   PAP SMEAR-Modifier  05/05/2018   COLONOSCOPY (Pts 45-25yr Insurance coverage will need to be confirmed)  10/12/2018   MAMMOGRAM  05/17/2020   INFLUENZA VACCINE  06/05/2021 (Originally 10/06/2020)   COVID-19 Vaccine (1) 09/26/2024 (Originally 07/20/1956)   TETANUS/TDAP  02/01/2023   Hepatitis C Screening  Completed   HIV Screening  Completed   HPV VACCINES  Aged Out    Health Maintenance  Health Maintenance Due  Topic Date Due   Zoster Vaccines- Shingrix (1 of 2) Never done  PAP SMEAR-Modifier  05/05/2018   COLONOSCOPY (Pts 45-29yr Insurance coverage will need to be confirmed)  10/12/2018   MAMMOGRAM  05/17/2020    {Colorectal cancer screening:2101809}  {Mammogram status:21018020}  Bone Density: Not yet eligible   Lung Cancer Screening: (Low Dose CT Chest recommended if Age 64-80years, 30 pack-year currently smoking OR have quit w/in 15years.) does qualify. CT chest  completed 02/26/21    Additional Screening:  Hepatitis C Screening: does qualify; Completed 04/30/15  Vision Screening: Recommended annual ophthalmology exams for early detection of glaucoma and other disorders of the eye. Is the patient up to date with their annual eye exam?  {YES/NO:21197} Who is the provider or what is the name of the office in which the patient attends annual eye exams? *** If pt is not established with a provider, would they like to be referred to a provider to establish care? {YES/NO:21197}.   Dental Screening: Recommended annual dental exams for proper oral hygiene  Community Resource Referral / Chronic Care Management: CRR required this visit?  {YES/NO:21197}  CCM required this visit?  {YES/NO:21197}     Plan:     I have personally reviewed and noted the following in the patients chart:   Medical and social history Use of alcohol, tobacco or illicit drugs  Current medications and supplements including opioid prescriptions.  Functional ability and status Nutritional status Physical activity Advanced directives List of other physicians Hospitalizations, surgeries, and ER visits in previous 12 months Vitals Screenings to include cognitive, depression, and falls Referrals and appointments  In addition, I have reviewed and discussed with patient certain preventive protocols, quality metrics, and best practice recommendations. A written personalized care plan for preventive services as well as general preventive health recommendations were provided to patient.     TLoma Messing LPN   21/61/0960  Nurse Health Advisor  Nurse Notes: none

## 2021-04-27 ENCOUNTER — Telehealth: Payer: Self-pay | Admitting: Family Medicine

## 2021-04-27 DIAGNOSIS — R7989 Other specified abnormal findings of blood chemistry: Secondary | ICD-10-CM

## 2021-04-27 DIAGNOSIS — E876 Hypokalemia: Secondary | ICD-10-CM

## 2021-04-27 DIAGNOSIS — E559 Vitamin D deficiency, unspecified: Secondary | ICD-10-CM

## 2021-04-27 DIAGNOSIS — E7800 Pure hypercholesterolemia, unspecified: Secondary | ICD-10-CM

## 2021-04-27 DIAGNOSIS — R7303 Prediabetes: Secondary | ICD-10-CM

## 2021-04-27 DIAGNOSIS — E538 Deficiency of other specified B group vitamins: Secondary | ICD-10-CM

## 2021-04-27 DIAGNOSIS — R5382 Chronic fatigue, unspecified: Secondary | ICD-10-CM

## 2021-04-27 NOTE — Telephone Encounter (Signed)
-----   Message from Ellamae Sia sent at 04/21/2021  4:00 PM EST ----- Regarding: Lab orders for Tuesday, 2.21.23 Patient is scheduled for CPX labs, please order future labs, Thanks , Karna Christmas

## 2021-04-28 ENCOUNTER — Other Ambulatory Visit: Payer: Medicare HMO

## 2021-04-28 ENCOUNTER — Ambulatory Visit: Payer: Medicare HMO

## 2021-05-07 ENCOUNTER — Ambulatory Visit (INDEPENDENT_AMBULATORY_CARE_PROVIDER_SITE_OTHER): Payer: Medicare HMO | Admitting: Family Medicine

## 2021-05-07 ENCOUNTER — Other Ambulatory Visit: Payer: Self-pay

## 2021-05-07 ENCOUNTER — Encounter: Payer: Self-pay | Admitting: Family Medicine

## 2021-05-07 VITALS — BP 118/70 | HR 75 | Temp 97.2°F | Ht 60.5 in | Wt 174.5 lb

## 2021-05-07 DIAGNOSIS — R7303 Prediabetes: Secondary | ICD-10-CM | POA: Diagnosis not present

## 2021-05-07 DIAGNOSIS — E559 Vitamin D deficiency, unspecified: Secondary | ICD-10-CM | POA: Diagnosis not present

## 2021-05-07 DIAGNOSIS — R7989 Other specified abnormal findings of blood chemistry: Secondary | ICD-10-CM | POA: Diagnosis not present

## 2021-05-07 DIAGNOSIS — E538 Deficiency of other specified B group vitamins: Secondary | ICD-10-CM | POA: Diagnosis not present

## 2021-05-07 DIAGNOSIS — E2839 Other primary ovarian failure: Secondary | ICD-10-CM

## 2021-05-07 DIAGNOSIS — Z0000 Encounter for general adult medical examination without abnormal findings: Secondary | ICD-10-CM

## 2021-05-07 DIAGNOSIS — E7800 Pure hypercholesterolemia, unspecified: Secondary | ICD-10-CM

## 2021-05-07 DIAGNOSIS — Z1211 Encounter for screening for malignant neoplasm of colon: Secondary | ICD-10-CM | POA: Diagnosis not present

## 2021-05-07 DIAGNOSIS — Z1231 Encounter for screening mammogram for malignant neoplasm of breast: Secondary | ICD-10-CM | POA: Diagnosis not present

## 2021-05-07 DIAGNOSIS — J449 Chronic obstructive pulmonary disease, unspecified: Secondary | ICD-10-CM

## 2021-05-07 DIAGNOSIS — R5382 Chronic fatigue, unspecified: Secondary | ICD-10-CM

## 2021-05-07 DIAGNOSIS — E876 Hypokalemia: Secondary | ICD-10-CM | POA: Diagnosis not present

## 2021-05-07 DIAGNOSIS — M069 Rheumatoid arthritis, unspecified: Secondary | ICD-10-CM

## 2021-05-07 DIAGNOSIS — K219 Gastro-esophageal reflux disease without esophagitis: Secondary | ICD-10-CM

## 2021-05-07 DIAGNOSIS — G35 Multiple sclerosis: Secondary | ICD-10-CM

## 2021-05-07 LAB — TSH: TSH: 2.11 u[IU]/mL (ref 0.35–5.50)

## 2021-05-07 LAB — VITAMIN B12: Vitamin B-12: 259 pg/mL (ref 211–911)

## 2021-05-07 LAB — CBC WITH DIFFERENTIAL/PLATELET
Basophils Absolute: 0.1 10*3/uL (ref 0.0–0.1)
Basophils Relative: 1.3 % (ref 0.0–3.0)
Eosinophils Absolute: 0.2 10*3/uL (ref 0.0–0.7)
Eosinophils Relative: 2.5 % (ref 0.0–5.0)
HCT: 46.1 % — ABNORMAL HIGH (ref 36.0–46.0)
Hemoglobin: 15.1 g/dL — ABNORMAL HIGH (ref 12.0–15.0)
Lymphocytes Relative: 29.7 % (ref 12.0–46.0)
Lymphs Abs: 1.9 10*3/uL (ref 0.7–4.0)
MCHC: 32.8 g/dL (ref 30.0–36.0)
MCV: 95.1 fl (ref 78.0–100.0)
Monocytes Absolute: 0.5 10*3/uL (ref 0.1–1.0)
Monocytes Relative: 8.2 % (ref 3.0–12.0)
Neutro Abs: 3.6 10*3/uL (ref 1.4–7.7)
Neutrophils Relative %: 58.3 % (ref 43.0–77.0)
Platelets: 193 10*3/uL (ref 150.0–400.0)
RBC: 4.85 Mil/uL (ref 3.87–5.11)
RDW: 13.7 % (ref 11.5–15.5)
WBC: 6.2 10*3/uL (ref 4.0–10.5)

## 2021-05-07 LAB — VITAMIN D 25 HYDROXY (VIT D DEFICIENCY, FRACTURES): VITD: 45.27 ng/mL (ref 30.00–100.00)

## 2021-05-07 LAB — LIPID PANEL
Cholesterol: 258 mg/dL — ABNORMAL HIGH (ref 0–200)
HDL: 69 mg/dL (ref >39.00)
LDL Cholesterol: 173 mg/dL — ABNORMAL HIGH (ref 0–99)
NonHDL: 189.32
Total CHOL/HDL Ratio: 4
Triglycerides: 84 mg/dL (ref 0.0–149.0)
VLDL: 16.8 mg/dL (ref 0.0–40.0)

## 2021-05-07 LAB — COMPREHENSIVE METABOLIC PANEL
ALT: 14 U/L (ref 0–35)
AST: 18 U/L (ref 0–37)
Albumin: 4.2 g/dL (ref 3.5–5.2)
Alkaline Phosphatase: 70 U/L (ref 39–117)
BUN: 11 mg/dL (ref 6–23)
CO2: 32 mEq/L (ref 19–32)
Calcium: 10.2 mg/dL (ref 8.4–10.5)
Chloride: 104 mEq/L (ref 96–112)
Creatinine, Ser: 1.11 mg/dL (ref 0.40–1.20)
GFR: 52.98 mL/min — ABNORMAL LOW (ref >60.00)
Glucose, Bld: 107 mg/dL — ABNORMAL HIGH (ref 70–99)
Potassium: 5.1 mEq/L (ref 3.5–5.1)
Sodium: 142 mEq/L (ref 135–145)
Total Bilirubin: 0.3 mg/dL (ref 0.2–1.2)
Total Protein: 7.4 g/dL (ref 6.0–8.3)

## 2021-05-07 LAB — HEMOGLOBIN A1C: Hgb A1c MFr Bld: 5.9 % (ref 4.6–6.5)

## 2021-05-07 NOTE — Assessment & Plan Note (Signed)
Per pt stable  ?Has constant hand tremor  ?Sees neuro  ?No treatment currently  ?

## 2021-05-07 NOTE — Assessment & Plan Note (Signed)
Mammogram ordered-pt will schedule  ?Encouraged self breast exams ? ? ?

## 2021-05-07 NOTE — Assessment & Plan Note (Signed)
Ongoing and multi factorial ?Labs ordered  ?

## 2021-05-07 NOTE — Assessment & Plan Note (Signed)
Lab for a1c pending  ?disc imp of low glycemic diet and wt loss to prevent DM2  ?

## 2021-05-07 NOTE — Assessment & Plan Note (Signed)
Has seen rheumatology  ?No treatment currently ?

## 2021-05-07 NOTE — Assessment & Plan Note (Signed)
Encouraged good fluid intake  ?Labs pending  ?

## 2021-05-07 NOTE — Assessment & Plan Note (Signed)
Disc goals for lipids and reasons to control them ?Rev last labs with pt ?Rev low sat fat diet in detail ?Due for labs  ? ?Pt states she is allergic to generic crestor (rash) but can take name brand  ?This may req prior auth  ?Cardiology gave her the last px ?

## 2021-05-07 NOTE — Patient Instructions (Addendum)
Call your insurance to see why they won't pay for crestor name brand  ? ?For cholesterol ?Avoid red meat/ fried foods/ egg yolks/ fatty breakfast meats/ butter, cheese and high fat dairy/ and shellfish  ? ?For diabetes prevention  ?Try to get most of your carbohydrates from produce (with the exception of white potatoes)  ?Eat less bread/pasta/rice/snack foods/cereals/sweets and other items from the middle of the grocery store (processed carbs) ? ?Please do the ifob kit for colon cancer screening  ?When you are ready to do a colonoscopy let us know ? ?Try to get 1200-1500 mg of calcium per day with at least 1000 iu of vitamin D - for bone health ? ?Use gentle heat on your back  ?Stretch it out  ?Make sure your pillow is not too tall or short  ?If the shoulder blade pain does not improve let us know  ? ?Labs today  ? ? ?Please schedule your mammogram and bone density test  at the breast center  ? ?Please call the location of your choice from the menu below to schedule your Mammogram and/or Bone Density appointment.   ? ?[place]  ? ?[facility]                ?      Phone:  [phone] ?[address]                               ?[place], [place] [address]                                                             ?Services: Traditional and 3D Mammogram, Bone Density  ? ?[facility]           ?      Phone: [phone] ?[address]                                                       ?[place], [place] [address]    ?Service: Bone Density ONLY  ? *this site does NOT perform mammograms ? ?[place]                       ? Phone:  [phone] ?[address]                                  ?[place], [place] [address]                                            ?Services:  3D Mammogram and Bone Density  ? ? ?[facility]an [facility] ? ?[place] [facility]   ?Phone:  [phone]   ?[place]  [place], [place] [address]                                            ?Services: 3D Mammogram and Bone Density ? ?[place] [facility])  ?Phone:  [phone]   ?[address]                        ?[place], [place] [address]                                              ?Services:  3D Mammogram and Bone Density ? ?

## 2021-05-07 NOTE — Progress Notes (Signed)
Subjective:    Patient ID: Karina [name], female    DOB: 1955/11/26, 66 y.o.   MRN: [id number]  This visit occurred during the SARS-CoV-2 public health emergency.  Safety protocols were in place, including screening questions prior to the visit, additional usage of staff PPE, and extensive cleaning of exam room while observing appropriate contact time as indicated for disinfecting solutions.   HPI Here for health maintenance exam and to review chronic medical problems    Wt Readings from Last 3 Encounters:  05/07/21 174 lb 8 oz (79.2 kg)  04/17/21 178 lb (80.7 kg)  03/25/21 179 lb 6.4 oz (81.4 kg)   33.52 kg/m  Eats very small portions  Does not eat a lot of sugar anymore    Doing fair  Lot of trouble breathing lately - also had covid and some heart concerns   Does not go out Too sob  Husband does the shopping   Not anxious  Just frustrated    Zoster status  - declines shingrix  Declines flu shot  Tdap 01/2013   Pap 2017-declines this   Mammogram 05/2019   Self breast exam- no lumps   Colonoscopy 10/2013 with 5 y recall for adenoma polyp  Is open to ifob kit  Breathing is too bad to do colonoscopy    Smoking status : few cig per day/almost quit Cannot afford patches   Sees pulmonary for copd /asthmatic bronchitis   Takes protonix 4 mg bid for GERD  No falls  No fractures  Takes centrum silver    B12 deficiency Lab Results  Component Value Date   VITAMINB12 376 04/20/2019    Sees rheumatology for RA  Hyperlipidemia Lab Results  Component Value Date   CHOL 198 08/27/2020   HDL 54.00 08/27/2020   LDLCALC 119 (H) 08/27/2020   LDLDIRECT 206.5 01/24/2013   TRIG 123.0 08/27/2020   CHOLHDL 4 08/27/2020   Crestor 20 mg daily from Dr Mariah Milling She declined cardiace CTA or stress test at recent cardiology visit   Allergic to generic  Intol of atorvastatin    Eats a lot of chips at night  Tries to sub fruit  Not a lot of sugar     Prediabetes Lab Results  Component Value Date   HGBA1C 5.4 04/20/2019   Due for labs   More back pain lately especially around L shoulder blade  This radiates up and down    Patient Active Problem List   Diagnosis Date Noted   Colon cancer screening 05/07/2021   Estrogen deficiency 05/07/2021   Acute bronchitis with COPD (HCC) 03/31/2021   Rheumatoid arthritis (HCC) 03/31/2021   Coronary artery calcification 03/31/2021   Hypokalemia 08/27/2020   Joint pain in both hands 10/12/2019   Productive cough 08/22/2019   Head pain 08/22/2019   Elevated serum creatinine 04/24/2019   Frequent urination 04/20/2019   Localized swelling, mass and lump, neck 04/20/2019   Decreased appetite 04/20/2019   Encounter for screening mammogram for breast cancer 04/20/2019   Seborrheic keratosis, inflamed 12/15/2017   B12 deficiency 08/24/2017   Left low back pain 08/24/2017   Erosive osteoarthritis of both hands 09/09/2016   Fatigue 09/07/2016   Bilateral hand pain 09/07/2016   Routine general medical examination at a health care facility 05/06/2015   Large breasts 05/06/2015   Lumbar disc disease 11/01/2014   Knee pain, bilateral 03/11/2014   Thoracic back pain 11/30/2013   Encounter for routine gynecological examination 01/31/2013   Encounter for Medicare  annual wellness exam 01/23/2013   Chest pain 09/20/2012   SOB (shortness of breath) 09/20/2012   Dyspnea 09/13/2012   Asthmatic bronchitis , chronic (HCC) 12/06/2011   Chronic cough 11/22/2011   Anxiety disorder 07/17/2009   HEMORRHOIDS-EXTERNAL 11/07/2008   IRRITABLE BOWEL SYNDROME 11/07/2008   Lung nodule 10/14/2008   Prediabetes 10/14/2008   PERSONAL HX COLONIC POLYPS 10/08/2008   Vitamin D deficiency 07/23/2008   SYNCOPE, HX OF 06/01/2008   FOOT SURGERY, HX OF 06/01/2008   DILATION AND CURETTAGE, HX OF 06/01/2008   OSTEOARTHRITIS, SHOULDER, RIGHT 03/22/2008   ROTATOR CUFF SYNDROME, RIGHT 03/22/2008   ARTHRALGIA  12/12/2007   Allergic rhinitis 08/01/2007   Current smoker 10/17/2006   Peripheral neuropathy 10/17/2006   FOOT PAIN, BILATERAL 10/17/2006   HYPOGLYCEMIA 09/30/2006   HYPERCHOLESTEROLEMIA 09/30/2006   MULTIPLE SCLEROSIS 09/30/2006   GERD 09/30/2006   FIBROCYSTIC BREAST DISEASE 09/30/2006   Past Medical History:  Diagnosis Date   Allergic rhinitis    Allergy    Anxiety    [place]- Evalina Field, no per pt   Cataract    Colon polyps 08/2008   Adenomatous   COPD (chronic obstructive pulmonary disease) (HCC)    Depression    no per pt   Diabetes mellitus    Gastritis 08/2008   H. pylori   GERD (gastroesophageal reflux disease)    Hypercholesteremia    Lung nodule    Menopausal disorder    Multiple sclerosis (HCC)    Plantar fasciitis    Seizures (HCC)    1 seizure in 2001 due to blood sugar dropping to 38, none since   Shingles    Left V1   Urinary incontinence    Vertigo    Past Surgical History:  Procedure Laterality Date   CERVICAL DISCECTOMY  2004   x 2, Anterior; Fusion C4-5, C5-6, C6-7, Dr. Donalee Citrin   CHEST CT  11/2008   With small 4 mm nodule L lung base (rec re check in 1 year)   CHEST CT  07/2009   Re check chest CT - lung nodule stable   CHOLECYSTECTOMY     COLONOSCOPY  08/2008   Polyps/ re check 5 yrs   CT SINUS LTD W/O CM  02/2009   negative   DILATION AND CURETTAGE OF UTERUS     ESOPHAGOGASTRODUODENOSCOPY  08/2008   Erosive gastritis, h pylori (treated)   FOOT SURGERY     left plantar fascial problem   ROTATOR CUFF REPAIR     right   TONSILLECTOMY     TUBAL LIGATION     UPPER GASTROINTESTINAL ENDOSCOPY     Social History   Tobacco Use   Smoking status: Every Day    Packs/day: 2.00    Years: 30.00    Pack years: 60.00    Types: Cigarettes    Last attempt to quit: 06/17/2019    Years since quitting: 1.8   Smokeless tobacco: Never   Tobacco comments:    0.5ppd - 03/25/2021  Vaping Use   Vaping Use: Never used  Substance Use Topics    Alcohol use: No    Alcohol/week: 0.0 standard drinks   Drug use: No   Family History  Problem Relation Age of Onset   Migraines Mother    Heart attack Mother    Coronary artery disease Mother    Coronary artery disease Father        6 bypasses    Stroke Father    Diabetes Father  Renal Disease Father    Colon cancer Maternal Grandfather 69   Coronary artery disease Cousin    Coronary artery disease Paternal Grandmother    Lung cancer Paternal Aunt    Multiple sclerosis Neg Hx    Esophageal cancer Neg Hx    Rectal cancer Neg Hx    Stomach cancer Neg Hx    Breast cancer Neg Hx    Allergies  Allergen Reactions   Percocet [Oxycodone-Acetaminophen] Shortness Of Breath    Felt like going to pass out and sweating   Tecfidera [Dimethyl Fumarate] Shortness Of Breath, Palpitations, Rash and Cough   Amitriptyline Hcl Itching and Swelling   Atorvastatin Other (See Comments)    elevated LFT's   Betaseron [Interferon Beta-1b] Other (See Comments)    Headache   Clarithromycin Other (See Comments)    reaction not known   Duloxetine Other (See Comments)    pain   Pregabalin Other (See Comments)    LE swelling   Pseudoephedrine Other (See Comments)    legs hurt   Zoloft [Sertraline Hcl] Other (See Comments)    Headaches    Levofloxacin Rash   Current Outpatient Medications on File Prior to Visit  Medication Sig Dispense Refill   albuterol (VENTOLIN HFA) 108 (90 Base) MCG/ACT inhaler INHALE 2 PUFFS UP TO EVERY 4 HOURS AS NEEDED FOR WHEEZING 3 each 3   ALPRAZolam (XANAX) 0.5 MG tablet Take 1 tablet (0.5 mg total) by mouth 2 (two) times daily as needed. For anxiety. 30 tablet 0   aspirin EC 81 MG tablet Take 1 tablet (81 mg total) by mouth daily. Swallow whole. 90 tablet 3   azelastine (ASTELIN) 0.1 % nasal spray Place 2 sprays into both nostrils 2 (two) times daily as needed.  12   Blood Glucose Monitoring Suppl (ACCU-CHEK AVIVA PLUS) w/Device KIT Check blood sugar once daily and  as directed Dx R73.09 1 kit 0   budesonide-formoterol (SYMBICORT) 160-4.5 MCG/ACT inhaler Inhale 2 puffs into the lungs 2 (two) times daily. 3 each 2   Diclofenac Sodium 3 % GEL Place 1 application onto the skin every 12 (twelve) hours as needed. 50 g 0   fluticasone (FLONASE) 50 MCG/ACT nasal spray Place 1 spray into both nostrils daily. 16 g 2   gabapentin (NEURONTIN) 300 MG capsule TAKE 1 CAPSULE BY MOUTH TWICE DURING THE DAY AND 3 CAPSULES BY MOUTH AT NIGHT (Patient taking differently: Take 300-900 mg by mouth See admin instructions. Take 2 capsule at night) 450 capsule 3   ipratropium-albuterol (DUONEB) 0.5-2.5 (3) MG/3ML SOLN TAKE 3 MLS BY NEBULIZATION EVERY 6 (SIX) HOURS AS NEEDED. 360 mL 0   nystatin (MYCOSTATIN) 100000 UNIT/ML suspension Swish and swallow 5 ml as needed up to three times daily for thrush 120 mL 1   pantoprazole (PROTONIX) 40 MG tablet Take 40 mg by mouth 2 (two) times daily.     rosuvastatin (CRESTOR) 20 MG tablet Take 1 tablet (20 mg total) by mouth daily. 30 tablet 6   Spacer/Aero-Holding Chambers (AEROCHAMBER MV) inhaler Use as instructed 1 each 0   No current facility-administered medications on file prior to visit.    Review of Systems  Constitutional:  Positive for fatigue. Negative for activity change, appetite change, fever and unexpected weight change.  HENT:  Negative for congestion, ear pain, rhinorrhea, sinus pressure and sore throat.   Eyes:  Negative for pain, redness and visual disturbance.  Respiratory:  Negative for cough, shortness of breath and wheezing.   Cardiovascular:  Negative for chest pain and palpitations.  Gastrointestinal:  Negative for abdominal pain, blood in stool, constipation and diarrhea.  Endocrine: Negative for polydipsia and polyuria.  Genitourinary:  Negative for dysuria, frequency and urgency.  Musculoskeletal:  Positive for arthralgias, back pain and myalgias.  Skin:  Negative for pallor and rash.  Allergic/Immunologic:  Negative for environmental allergies.  Neurological:  Positive for tremors and weakness. Negative for dizziness, syncope and headaches.  Hematological:  Negative for adenopathy. Does not bruise/bleed easily.  Psychiatric/Behavioral:  Negative for decreased concentration and dysphoric mood. The patient is not nervous/anxious.       Objective:   Physical Exam Constitutional:      General: She is not in acute distress.    Appearance: Normal appearance. She is well-developed. She is obese. She is not ill-appearing or diaphoretic.  HENT:     Head: Normocephalic and atraumatic.     Right Ear: Tympanic membrane, ear canal and external ear normal.     Left Ear: Tympanic membrane, ear canal and external ear normal.     Nose: Nose normal. No congestion.     Mouth/Throat:     Mouth: Mucous membranes are moist.     Pharynx: Oropharynx is clear. No posterior oropharyngeal erythema.  Eyes:     General: No scleral icterus.    Extraocular Movements: Extraocular movements intact.     Conjunctiva/sclera: Conjunctivae normal.     Pupils: Pupils are equal, round, and reactive to light.  Neck:     Thyroid: No thyromegaly.     Vascular: No carotid bruit or JVD.  Cardiovascular:     Rate and Rhythm: Normal rate and regular rhythm.     Pulses: Normal pulses.     Heart sounds: Normal heart sounds.    No gallop.  Pulmonary:     Effort: Pulmonary effort is normal. No respiratory distress.     Breath sounds: Normal breath sounds. No wheezing.     Comments: Good air exch Chest:     Chest wall: No tenderness.  Abdominal:     General: Bowel sounds are normal. There is no distension or abdominal bruit.     Palpations: Abdomen is soft. There is no mass.     Tenderness: There is no abdominal tenderness.     Hernia: No hernia is present.  Genitourinary:    Comments: Breast exam: No mass, nodules, thickening, tenderness, bulging, retraction, inflamation, nipple discharge or skin changes noted.  No axillary  or clavicular LA.     Musculoskeletal:        General: No tenderness. Normal range of motion.     Cervical back: Normal range of motion and neck supple. No rigidity. No muscular tenderness.     Right lower leg: No edema.     Left lower leg: No edema.     Comments: No kyphosis   Some soreness of TS musculature on L by scapula Nl rom of UEs  Lymphadenopathy:     Cervical: No cervical adenopathy.  Skin:    General: Skin is warm and dry.     Coloration: Skin is not pale.     Findings: No erythema or rash.     Comments: Solar lentigines diffusely Solar aging Some sks  Neurological:     Mental Status: She is alert. Mental status is at baseline.     Cranial Nerves: No cranial nerve deficit.     Motor: No abnormal muscle tone.     Coordination: Coordination normal.  Gait: Gait normal.     Deep Tendon Reflexes: Reflexes are normal and symmetric. Reflexes normal.  Psychiatric:        Mood and Affect: Mood normal. Affect is blunt.        Cognition and Memory: Cognition and memory normal.          Assessment & Plan:   Problem List Items Addressed This Visit       Respiratory   Asthmatic bronchitis , chronic (HCC)    Under the care of pulmonary  Sob limits her daily activities Has cut down to 1-2 cig per day  Cannot afford nicotine patches         Digestive   GERD    Takes protonix B12 level added ot labs         Nervous and Auditory   MULTIPLE SCLEROSIS    Per pt stable  Has constant hand tremor  Sees neuro  No treatment currently         Musculoskeletal and Integument   Rheumatoid arthritis ([place])    Has seen rheumatology  No treatment currently        Other   B12 deficiency    B12 level pending  Takes ppi  Diet is fairly balanced       Colon cancer screening    Due for colonoscopy but declines due to baseline sob 2015-had 5 y recall  She is open to ifob kit-this was orderd      Relevant Orders   Fecal occult blood,  imunochemical([facility])   Elevated serum creatinine    Encouraged good fluid intake  Labs pending       Encounter for screening mammogram for breast cancer    Mammogram ordered-pt will schedule  Encouraged self breast exams        Relevant Orders   MM 3D SCREEN BREAST BILATERAL   Estrogen deficiency   Relevant Orders   DG Bone Density   Fatigue    Ongoing and multi factorial Labs ordered       HYPERCHOLESTEROLEMIA    Disc goals for lipids and reasons to control them Rev last labs with pt Rev low sat fat diet in detail Due for labs   Pt states she is allergic to generic crestor (rash) but can take name brand  This may req prior auth  Cardiology gave her the last px      Hypokalemia    Lab pending       Prediabetes    Lab for a1c pending  disc imp of low glycemic diet and wt loss to prevent DM2       Routine general medical examination at a health care facility - Primary    Reviewed health habits including diet and exercise and skin cancer prevention Reviewed appropriate screening tests for age  Also reviewed health mt list, fam hx and immunization status , as well as social and family history   See HPI Labs ordered  Declines shingrix and flu shot  Declines pap Mammogram ordered for pt to schedule  dexa ordered -she is high risk for OP due to smoking Recommend ca and D and handout given ifob kit ordered (pt declines colonoscopy this year)        Vitamin D deficiency    Discussed importance to bone and overall health Level ordered

## 2021-05-07 NOTE — Assessment & Plan Note (Signed)
Discussed importance to bone and overall health ?Level ordered  ?

## 2021-05-07 NOTE — Assessment & Plan Note (Signed)
Under the care of pulmonary  ?Sob limits her daily activities ?Has cut down to 1-2 cig per day  ?Cannot afford nicotine patches ? ?

## 2021-05-07 NOTE — Assessment & Plan Note (Signed)
Takes protonix ?B12 level added ot labs  ?

## 2021-05-07 NOTE — Assessment & Plan Note (Signed)
B12 level pending  ?Takes ppi  ?Diet is fairly balanced  ?

## 2021-05-07 NOTE — Assessment & Plan Note (Signed)
Lab pending.

## 2021-05-07 NOTE — Assessment & Plan Note (Signed)
Due for colonoscopy but declines due to baseline sob ?2015-had 5 y recall  ?She is open to ifob kit-this was orderd ?

## 2021-05-07 NOTE — Assessment & Plan Note (Signed)
Reviewed health habits including diet and exercise and skin cancer prevention ?Reviewed appropriate screening tests for age  ?Also reviewed health mt list, fam hx and immunization status , as well as social and family history   ?See HPI ?Labs ordered  ?Declines shingrix and flu shot  ?Declines pap ?Mammogram ordered for pt to schedule  ?dexa ordered -she is high risk for OP due to smoking ?Recommend ca and D and handout given ?ifob kit ordered (pt declines colonoscopy this year)  ? ?

## 2021-05-11 ENCOUNTER — Other Ambulatory Visit (INDEPENDENT_AMBULATORY_CARE_PROVIDER_SITE_OTHER): Payer: Medicare HMO

## 2021-05-11 DIAGNOSIS — Z1211 Encounter for screening for malignant neoplasm of colon: Secondary | ICD-10-CM

## 2021-05-12 ENCOUNTER — Telehealth: Payer: Self-pay | Admitting: Radiology

## 2021-05-12 DIAGNOSIS — Z7982 Long term (current) use of aspirin: Secondary | ICD-10-CM | POA: Diagnosis not present

## 2021-05-12 DIAGNOSIS — E785 Hyperlipidemia, unspecified: Secondary | ICD-10-CM | POA: Diagnosis not present

## 2021-05-12 DIAGNOSIS — J301 Allergic rhinitis due to pollen: Secondary | ICD-10-CM | POA: Diagnosis not present

## 2021-05-12 DIAGNOSIS — J449 Chronic obstructive pulmonary disease, unspecified: Secondary | ICD-10-CM | POA: Diagnosis not present

## 2021-05-12 DIAGNOSIS — M1990 Unspecified osteoarthritis, unspecified site: Secondary | ICD-10-CM | POA: Diagnosis not present

## 2021-05-12 DIAGNOSIS — R69 Illness, unspecified: Secondary | ICD-10-CM | POA: Diagnosis not present

## 2021-05-12 DIAGNOSIS — G35 Multiple sclerosis: Secondary | ICD-10-CM | POA: Diagnosis not present

## 2021-05-12 DIAGNOSIS — K219 Gastro-esophageal reflux disease without esophagitis: Secondary | ICD-10-CM | POA: Diagnosis not present

## 2021-05-12 DIAGNOSIS — M0550 Rheumatoid polyneuropathy with rheumatoid arthritis of unspecified site: Secondary | ICD-10-CM | POA: Diagnosis not present

## 2021-05-12 DIAGNOSIS — R030 Elevated blood-pressure reading, without diagnosis of hypertension: Secondary | ICD-10-CM | POA: Diagnosis not present

## 2021-05-12 DIAGNOSIS — R32 Unspecified urinary incontinence: Secondary | ICD-10-CM | POA: Diagnosis not present

## 2021-05-12 LAB — FECAL OCCULT BLOOD, IMMUNOCHEMICAL: Fecal Occult Bld: POSITIVE — AB

## 2021-05-12 NOTE — Telephone Encounter (Signed)
Aware  ?Will need colonoscopy  ?See result note  ?

## 2021-05-12 NOTE — Telephone Encounter (Signed)
[facility] called a POSITIVE ifob, results given to Dr [name] 

## 2021-05-20 ENCOUNTER — Telehealth: Payer: Self-pay | Admitting: Family Medicine

## 2021-05-20 NOTE — Chronic Care Management (AMB) (Signed)
?  Chronic Care Management  ? ?Note ? ?05/20/2021 ?Name: SHAKENYA STONEBERG MRN: [id number] DOB: [birth year] ? ?OBERA STAUCH is a 66 y.o. year old female who is a primary care patient of [name], Wynelle Fanny, MD. I reached out to [place] by phone today in response to a referral sent by Ms. Tinisha M Mussa's PCP, [name], Wynelle Fanny, MD.  ? ?Ms. Ueda was given information about Chronic Care Management services today including:  ?CCM service includes personalized support from designated clinical staff supervised by her physician, including individualized plan of care and coordination with other care providers ?24/7 contact phone numbers for assistance for urgent and routine care needs. ?Service will only be billed when office clinical staff spend 20 minutes or more in a month to coordinate care. ?Only one practitioner may furnish and bill the service in a calendar month. ?The patient may stop CCM services at any time (effective at the end of the month) by phone call to the office staff. ? ? ?Patient agreed to services and verbal consent obtained.  ? ?Follow up plan: ? ? ?Tatjana Dellinger ?[facility] Scheduler  ?

## 2021-05-25 ENCOUNTER — Telehealth: Payer: Self-pay | Admitting: Family Medicine

## 2021-05-25 ENCOUNTER — Telehealth: Payer: Self-pay

## 2021-05-25 DIAGNOSIS — R195 Other fecal abnormalities: Secondary | ICD-10-CM | POA: Insufficient documentation

## 2021-05-25 NOTE — Telephone Encounter (Signed)
I put the order in  ?Please give her the [place] # to call and schedule ?Thanks  ?

## 2021-05-25 NOTE — Telephone Encounter (Signed)
-----  Message from Abner Greenspan, MD sent at 05/12/2021  7:32 PM EST ----- ?Your ifob kit is positive ?I want to go ahead and refer you for a colonoscopy  ?Do you prefer [facility] or [facility] ?   ?

## 2021-05-25 NOTE — Telephone Encounter (Signed)
Pt returned call regarding result . Would like a call back [phone]  ?

## 2021-05-25 NOTE — Telephone Encounter (Signed)
-----   Message from Mardelle Matte, [place] sent at 05/25/2021 12:49 PM EDT ----- ?Spoke w/ pt regarding results she would like to referred to  Dr Fuller Plan 's office  ?

## 2021-05-25 NOTE — Telephone Encounter (Signed)
Addressed through result notes  

## 2021-05-25 NOTE — Telephone Encounter (Signed)
Called and lvm for patient to call us back regarding results. ?

## 2021-05-26 NOTE — Telephone Encounter (Signed)
Okay thank you

## 2021-06-01 ENCOUNTER — Other Ambulatory Visit: Payer: Self-pay

## 2021-06-01 MED ORDER — GABAPENTIN 300 MG PO CAPS: ORAL_CAPSULE | ORAL | 3 refills | Status: DC

## 2021-06-03 ENCOUNTER — Encounter: Payer: Self-pay | Admitting: *Deleted

## 2021-06-05 ENCOUNTER — Telehealth: Payer: Self-pay

## 2021-06-05 NOTE — Chronic Care Management (AMB) (Signed)
? ? ?Chronic Care Management ?Pharmacy Assistant  ? ?Name: KASSIDEE NARCISO  MRN: [id number] DOB: 07-Mar-1956 ? ?MAREE AINLEY is an 66 y.o. year old female who presents for his initial CCM visit with the clinical pharmacist. ? ?Reason for Encounter: Initial Questions ?  ?Conditions to be addressed/monitored: ?CAD and HLD  COPD ? ? ?Recent office visits:  ?05/07/21-PCP-Marne [name],MD- AWV-Intol of atorvastatin-reduced smoking-She is open to ifob kit-this was orderd-labs ordered-discussed screenings and vaccines, diet and weight loss.No medication changes ? ?Recent consult visits:  ?04/17/21-Cardiology-Timothy Gollan,MD-New consult for shortness of breath.Patient not interested in scans, unable to tolerate metoprolol,ordered crestor 10-20 mg. Take 1 tablet daily, aspirin $RemoveBefore'81mg'[id number]$  take 1 tablet daily . ?03/25/21-Pulmonology-Elizabeth Walsh,NP- Follow up COPD , chronic bronchitis.Reduced smoking,using symbicort, unable to tolerate Breztri.On nicotine patch Sending in Doxycycline $RemoveBefore'100mg'yKbIagZsjcqbI$  BID x 7 days and prednisone for bronchitis symptoms/wheezing heard on exam. Advised she take mucinex $RemoveBefo'600mg'lvWjjTJqwnt$  twice daily and use flutter valve 2-3 times a day to help with mucoid clearance ?02/23/21-Cardiology-Timothy Gollan,MD-Initial visit for SOB,fatigue,skipping heart beat.EKG,labs ordered,scans ordered,no medication changes ?02/10/21-Pulmonology-Vineet Sood,MD-Cough not resolving-Try sample of breztri two puffs twice per day, and rinse your mouth after each use. ?01/22/21-Dermatology-David Kowalski,MD- cryotherapy ?01/08/22-Pulmonology-Vineet Sood,MD- post covid follow up cough-will give her course of prednisone and doxycycline- advised her to use symbicort 160 two puffs bid with spacer device,will have her use flonase and azelastine on regular basis for now.schedule chest xray and PFT. ? ?Hospital visits:  ?None in previous 6 months ? ?Medications: ?Outpatient Encounter Medications as of 06/05/2021  ?Medication Sig  ? albuterol (VENTOLIN HFA) 108  (90 Base) MCG/ACT inhaler INHALE 2 PUFFS UP TO EVERY 4 HOURS AS NEEDED FOR WHEEZING  ? ALPRAZolam (XANAX) 0.5 MG tablet Take 1 tablet (0.5 mg total) by mouth 2 (two) times daily as needed. For anxiety.  ? aspirin EC 81 MG tablet Take 1 tablet (81 mg total) by mouth daily. Swallow whole.  ? azelastine (ASTELIN) 0.1 % nasal spray Place 2 sprays into both nostrils 2 (two) times daily as needed.  ? Blood Glucose Monitoring Suppl (ACCU-CHEK AVIVA PLUS) w/Device KIT Check blood sugar once daily and as directed Dx R73.09  ? budesonide-formoterol (SYMBICORT) 160-4.5 MCG/ACT inhaler Inhale 2 puffs into the lungs 2 (two) times daily.  ? Diclofenac Sodium 3 % GEL Place 1 application onto the skin every 12 (twelve) hours as needed.  ? fluticasone (FLONASE) 50 MCG/ACT nasal spray Place 1 spray into both nostrils daily.  ? gabapentin (NEURONTIN) 300 MG capsule TAKE 1 CAPSULE BY MOUTH TWICE DURING THE DAY AND 3 CAPSULES BY MOUTH AT NIGHT  ? ipratropium-albuterol (DUONEB) 0.5-2.5 (3) MG/3ML SOLN TAKE 3 MLS BY NEBULIZATION EVERY 6 (SIX) HOURS AS NEEDED.  ? nystatin (MYCOSTATIN) 100000 UNIT/ML suspension Swish and swallow 5 ml as needed up to three times daily for thrush  ? pantoprazole (PROTONIX) 40 MG tablet Take 40 mg by mouth 2 (two) times daily.  ? rosuvastatin (CRESTOR) 20 MG tablet Take 1 tablet (20 mg total) by mouth daily.  ? Spacer/Aero-Holding Chambers (AEROCHAMBER MV) inhaler Use as instructed  ? ?No facility-administered encounter medications on file as of 06/05/2021.  ? ? ?Lab Results  ?Component Value Date/Time  ? HGBA1C 5.9 05/07/2021 08:47 AM  ? HGBA1C 5.4 04/20/2019 02:44 PM  ?  ? ?BP Readings from Last 3 Encounters:  ?05/07/21 118/70  ?04/17/21 140/85  ?03/25/21 130/82  ? ? ? ? ?Patient contacted to confirm telephone appointment with Charlene Brooke, Pharm D, on 06/10/21 at 1:30pm. ? ?  Do you have any problems getting your medications? Yes ?If yes what types of problems are you experiencing? Access barriers-[facility] wont fill brand name Crestor ? ?What is your top health concern you would like to discuss at your upcoming visit? Unable to get name brand HLD medication Crestor- uses [facility] ? ?Have you seen any other providers since your last visit with PCP? No ? ? ?Star Rating Drugs:  ?Medication:  Last Fill: Day Supply ?Rosuvastatin 20mg   -  - Patient unable to tolerate generic HLD medication  ? ? ?Care Gaps: ?Annual wellness visit in last year? Yes ?Most Recent BP reading:118/70  75-P  05/07/21 ? ? ?Charlene Brooke, CPP notified ? ?Traves Majchrzak, CCMA ?Health concierge  ?[phone]  ?

## 2021-06-09 ENCOUNTER — Encounter: Payer: Self-pay | Admitting: Gastroenterology

## 2021-06-10 ENCOUNTER — Ambulatory Visit (INDEPENDENT_AMBULATORY_CARE_PROVIDER_SITE_OTHER): Payer: Medicare HMO | Admitting: Pharmacist

## 2021-06-10 ENCOUNTER — Other Ambulatory Visit: Payer: Self-pay | Admitting: Family Medicine

## 2021-06-10 DIAGNOSIS — E7800 Pure hypercholesterolemia, unspecified: Secondary | ICD-10-CM

## 2021-06-10 DIAGNOSIS — J449 Chronic obstructive pulmonary disease, unspecified: Secondary | ICD-10-CM

## 2021-06-10 DIAGNOSIS — G609 Hereditary and idiopathic neuropathy, unspecified: Secondary | ICD-10-CM

## 2021-06-10 DIAGNOSIS — J4489 Other specified chronic obstructive pulmonary disease: Secondary | ICD-10-CM

## 2021-06-10 MED ORDER — ALBUTEROL SULFATE HFA 108 (90 BASE) MCG/ACT IN AERS: INHALATION_SPRAY | RESPIRATORY_TRACT | 3 refills | Status: DC

## 2021-06-10 MED ORDER — CRESTOR 20 MG PO TABS: 20.0000 mg | ORAL_TABLET | Freq: Every day | ORAL | 6 refills | Status: DC

## 2021-06-10 NOTE — Telephone Encounter (Signed)
See pharmacy comments. Generic rosuvastatin is covered or atorvastatin but not name brand Crestor, they advised it's still $290 on Good Rx card for name brand. Will route to PCP for review and also Ria Comment pharmacist who sent rx in today  ?

## 2021-06-10 NOTE — Progress Notes (Signed)
? ?Chronic Care Management ?Pharmacy Note ? ?06/10/2021 ?Name:  LABERTA WILBON MRN:  [id number] DOB:  08-Sep-1955 ? ?Summary: CCM Initial visit ?-Pt has been off Crestor for several months due to insurance issues (brand-name not covered, she got a rash with generic Crestor but does fine with brand). She has not been able to talk to her insurance company. ? ?Recommendations/Changes made from today's visit: ?-Re-ordered Crestor with DAW-1 (dispense as written). Will follow up with insurance ? ?Plan: ?-[place] will call pharmacy/insurance regarding Crestor coverage ?-Pharmacist follow up televisit scheduled for 6 months ? ? ? ?Subjective: ?FARHANA FELLOWS is an 66 y.o. year old female who is a primary patient of [name], Wynelle Fanny, MD.  The CCM team was consulted for assistance with disease management and care coordination needs.   ? ?Engaged with patient by telephone for initial visit in response to provider referral for pharmacy case management and/or care coordination services.  ? ?Consent to Services:  ?The patient was given the following information about Chronic Care Management services today, agreed to services, and gave verbal consent: 1. CCM service includes personalized support from designated clinical staff supervised by the primary care provider, including individualized plan of care and coordination with other care providers 2. 24/7 contact phone numbers for assistance for urgent and routine care needs. 3. Service will only be billed when office clinical staff spend 20 minutes or more in a month to coordinate care. 4. Only one practitioner may furnish and bill the service in a calendar month. 5.The patient may stop CCM services at any time (effective at the end of the month) by phone call to the office staff. 6. The patient will be responsible for cost sharing (co-pay) of up to 20% of the service fee (after annual deductible is met). Patient agreed to services and consent obtained. ? ?Patient Care  Team: ?[name], Wynelle Fanny, MD as PCP - General ?Kathrynn Ducking, MD (Inactive) as Consulting Physician (Neurology) ?Minna Merritts, MD as Consulting Physician (Cardiology) ?Rutherford Guys, MD as Consulting Physician (Ophthalmology) ?Foltanski, Cleaster Corin, [facility] as Pharmacist (Pharmacist) ? ?Recent office visits: ?05/07/21-PCP-Marne [name],MD- AWV-Intol of atorvastatin-reduced smoking-She is open to ifob kit-this was orderd-labs ordered-discussed screenings and vaccines, diet and weight loss.No medication changes ? ?Recent consult visits: ?04/17/21-Cardiology-Timothy Gollan,MD-New consult for shortness of breath.Patient not interested in scans, unable to tolerate metoprolol,ordered crestor 10-20 mg. Take 1 tablet daily, aspirin 62m take 1 tablet daily . ? ?03/25/21-Pulmonology-Elizabeth Walsh,NP- Follow up COPD , chronic bronchitis.Reduced smoking,using symbicort, unable to tolerate Breztri.On nicotine patch Sending in Doxycycline 1070mBID x 7 days and prednisone for bronchitis symptoms/wheezing heard on exam. Advised she take mucinex 60058mwice daily and use flutter valve 2-3 times a day to help with mucoid clearance ? ?02/23/21-Cardiology-Timothy Gollan,MD-Initial visit for SOB,fatigue,skipping heart beat.EKG,labs ordered,scans ordered,no medication changes ? ?02/10/21-Pulmonology-Vineet Sood,MD-Cough not resolving-Try sample of breztri two puffs twice per day, and rinse your mouth after each use. ? ?01/22/21-Dermatology-David Kowalski,MD- cryotherapy ?01/08/22-Pulmonology-Vineet Sood,MD- post covid follow up cough-will give her course of prednisone and doxycycline- advised her to use symbicort 160 two puffs bid with spacer device,will have her use flonase and azelastine on regular basis for now.schedule chest xray and PFT. ?  ? ?Hospital visits: ?None in previous 6 months ? ? ?Objective: ? ?Lab Results  ?Component Value Date  ? CREATININE 1.11 05/07/2021  ? BUN 11 05/07/2021  ? GFR 52.98 (L) 05/07/2021  ? EGFR 61  02/23/2021  ? GFRNONAA >60 08/18/2020  ? GFRAA >  60 08/22/2019  ? NA 142 05/07/2021  ? K 5.1 05/07/2021  ? CALCIUM 10.2 05/07/2021  ? CO2 32 05/07/2021  ? GLUCOSE 107 (H) 05/07/2021  ? ? ?Lab Results  ?Component Value Date/Time  ? HGBA1C 5.9 05/07/2021 08:47 AM  ? HGBA1C 5.4 04/20/2019 02:44 PM  ? GFR 52.98 (L) 05/07/2021 08:47 AM  ? GFR 58.20 (L) 09/30/2020 09:53 AM  ?  ?Last diabetic Eye exam: No results found for: HMDIABEYEEXA  ?Last diabetic Foot exam: No results found for: HMDIABFOOTEX  ? ?Lab Results  ?Component Value Date  ? CHOL 258 (H) 05/07/2021  ? HDL 69.00 05/07/2021  ? LDLCALC 173 (H) 05/07/2021  ? LDLDIRECT 206.5 01/24/2013  ? TRIG 84.0 05/07/2021  ? CHOLHDL 4 05/07/2021  ? ? ? ?  Latest Ref Rng & Units 05/07/2021  ?  8:47 AM 08/18/2020  ?  8:35 AM 08/15/2020  ?  8:22 AM  ?Hepatic Function  ?Total Protein 6.0 - 8.3 g/dL 7.4   6.7   7.1    ?Albumin 3.5 - 5.2 g/dL 4.2   2.4   3.2    ?AST 0 - 37 U/L 18   37   20    ?ALT 0 - 35 U/L _0 ?Alk Phosphatase 39 - 117 U/L 70   66   57    ?Total Bilirubin 0.2 - 1.2 mg/dL 0.3   0.6   0.6    ? ? ?Lab Results  ?Component Value Date/Time  ? TSH 2.11 05/07/2021 08:47 AM  ? TSH 1.66 04/20/2019 02:44 PM  ? FREET4 0.93 08/11/2017 04:44 PM  ? ? ? ?  Latest Ref Rng & Units 05/07/2021  ?  8:47 AM 08/27/2020  ? 12:01 PM 08/18/2020  ?  8:35 AM  ?CBC  ?WBC 4.0 - 10.5 K/uL 6.2   7.7   11.7    ?Hemoglobin 12.0 - 15.0 g/dL 15.1   12.4   12.1    ?Hematocrit 36.0 - 46.0 % 46.1   36.9   35.8    ?Platelets 150.0 - 400.0 K/uL 193.0   361.0   283    ? ? ?Lab Results  ?Component Value Date/Time  ? VD25OH 45.27 05/07/2021 08:47 AM  ? VD25OH 77.69 07/24/2019 11:44 AM  ? ? ?Clinical ASCVD: Yes - co ?The 10-year ASCVD risk score (Arnett DK, et al., 2019) is: 7.8% ?  Values used to calculate the score: ?    Age: 66 years ?    Sex: Female ?    Is Non-Hispanic African American: No ?    Diabetic: No ?    Tobacco smoker: Yes ?    Systolic Blood Pressure: 536 mmHg ?    Is BP treated: No ?     HDL Cholesterol: 69 mg/dL ?    Total Cholesterol: 258 mg/dL   ? ? ?  05/07/2021  ?  8:09 AM 10/17/2019  ? 12:32 PM 09/01/2016  ? 11:50 AM  ?Depression screen PHQ 2/9  ?Decreased Interest 0 0 0  ?Down, Depressed, Hopeless 0 0 0  ?PHQ - 2 Score 0 0 0  ?Altered sleeping  0   ?Tired, decreased energy  0   ?Change in appetite  0   ?Feeling bad or failure about yourself   0   ?Trouble concentrating  0   ?Moving slowly or fidgety/restless  0   ?Suicidal thoughts  0   ?PHQ-9 Score  0   ?  Difficult doing work/chores  Not difficult at all   ?  ? ?Social History  ? ?Tobacco Use  ?Smoking Status Every Day  ? Packs/day: 2.00  ? Years: 30.00  ? Pack years: 60.00  ? Types: Cigarettes  ? Last attempt to quit: 06/17/2019  ? Years since quitting: 1.9  ?Smokeless Tobacco Never  ?Tobacco Comments  ? 0.5ppd - 03/25/2021  ? ?BP Readings from Last 3 Encounters:  ?05/07/21 118/70  ?04/17/21 140/85  ?03/25/21 130/82  ? ?Pulse Readings from Last 3 Encounters:  ?05/07/21 75  ?04/17/21 74  ?03/25/21 76  ? ?Wt Readings from Last 3 Encounters:  ?05/07/21 174 lb 8 oz (79.2 kg)  ?04/17/21 178 lb (80.7 kg)  ?03/25/21 179 lb 6.4 oz (81.4 kg)  ? ?BMI Readings from Last 3 Encounters:  ?05/07/21 33.52 kg/m?  ?04/17/21 33.63 kg/m?  ?03/25/21 33.90 kg/m?  ? ? ?Assessment/Interventions: Review of patient past medical history, allergies, medications, health status, including review of consultants reports, laboratory and other test data, was performed as part of comprehensive evaluation and provision of chronic care management services.  ? ?SDOH:  (Social Determinants of Health) assessments and interventions performed: Yes ?SDOH Interventions   ? ?Flowsheet Row Most Recent Value  ?SDOH Interventions   ?Food Insecurity Interventions Intervention Not Indicated  ?Financial Strain Interventions Intervention Not Indicated  ? ?  ? ?SDOH Screenings  ? ?Alcohol Screen: Not on file  ?Depression (PHQ2-9): Low Risk   ? PHQ-2 Score: 0  ?Financial Resource Strain: Low Risk   ?  Difficulty of Paying Living Expenses: Not very hard  ?Food Insecurity: No Food Insecurity  ? Worried About Charity fundraiser in the Last Year: Never true  ? Ran Out of Food in the Last Year: Never true  ?[place]

## 2021-06-10 NOTE — Patient Instructions (Signed)
Visit Information ? ?Phone number for Pharmacist: [phone] ? ?Thank you for meeting with me to discuss your medications! I look forward to working with you to achieve your health care goals. Below is a summary of what we talked about during the visit: ? ? Goals Addressed   ?None ?  ? ? ?Care Plan : [place]  ?Updates made by Karina [name], [place] since 06/10/2021 12:00 AM  ?  ? ?Problem: Hyperlipidemia and COPD   ?Priority: High  ?  ? ?Long-Range Goal: Disease mgmt   ?Start Date: 06/10/2021  ?Expected End Date: 06/11/2022  ?This Visit's Progress: On track  ?Priority: High  ?Note:   ?Current Barriers:  ?Unable to independently afford treatment regimen ? ?Pharmacist Clinical Goal(s):  ?Patient will verbalize ability to afford treatment regimen through collaboration with PharmD and provider.  ? ?Interventions: ?1:1 collaboration with Karina [name], Karina Fanny, MD regarding development and update of comprehensive plan of care as evidenced by provider attestation and co-signature ?Inter-disciplinary care team collaboration (see longitudinal plan of care) ?Comprehensive medication review performed; medication list updated in electronic medical record ? ?Hyperlipidemia: (LDL goal < 70) ?-Uncontrolled - LDL 173 (05/2021); pt has been off Crestor due to insurance issues - pt cannot tolerate generic Crestor (rash), but can tolerate brand. ?-Current treatment: ?Crestor 20 mg daily - not taking ?Aspirin 81 mg daily - Appropriate, Effective, Safe, Accessible ?-Medications previously tried: n/a  ?-Educated on Cholesterol goals; Benefits of statin for ASCVD risk reduction; ?-Recommended to continue current medication; refilled Crestor with DAW 1 (dispense as written) ? ?COPD (Goal: control symptoms and prevent exacerbations) ?-Controlled - pt reports she quit smoking 4 weeks ago, reports great improvement in breathing; she does need a refill for albuterol ?-Gold Grade: Gold 2 (FEV1 50-79%) ?-Current COPD Classification:  A  (low sx, <2 exacerbations/yr) ?-Pulmonary function testing: 01/2021 - FEV1 0.64, ratio 0.42 ?-Exacerbations requiring treatment in last 6 months: 0 ?-Current treatment  ?Albuterol HFA- Appropriate, Effective, Safe, Accessible ?Symbicort 160-4.5 mcg/act 2 puff BID - Appropriate, Effective, Safe, Accessible ?Duoneb - rare use ?-Medications previously tried: Librarian, academic  ?-Patient reports consistent use of maintenance inhaler ?-Frequency of rescue inhaler use: daily ?-Counseled on Proper inhaler technique;Benefits of consistent maintenance inhaler use ?-Recommended to continue current medication ? ?Pain (Goal: manage pain) ?-Controlled ?-Current treatment  ?Gabapentin 300 mg - 1 AM, 1 PM, 3 HS - pt just takes 2 at night - Appropriate, Effective, Safe, Accessible ?-Medications previously tried: n/a  ?-Recommended to continue current medication ? ?GERD (Goal: manage symptoms) ?-Controlled - pt reports taking PPI occasionally ?-Current treatment  ?Pantoprazole 40 mg daily PRN - Appropriate, Effective, Safe, Accessible ?-Medications previously tried: n/a  ?-Recommended to continue current medication ? ?Health Maintenance ?-Vaccine gaps: none ?-Current therapy:  ?Vitamin D ?Multivitamin (Centrum Silver) ?Flonase nasal spray ?-Patient is satisfied with current therapy and denies issues ?-Recommended to continue current medication ? ?Patient Goals/Self-Care Activities ?Patient will:  ?- take medications as prescribed as evidenced by patient report and record review ?focus on medication adherence by routine ?  ?  ? ?Karina [name] was given information about Chronic Care Management services today including:  ?CCM service includes personalized support from designated clinical staff supervised by her physician, including individualized plan of care and coordination with other care providers ?24/7 contact phone numbers for assistance for urgent and routine care needs. ?Standard insurance, coinsurance, copays and deductibles apply for  chronic care management only during months in which we provide at least 20 minutes of  these services. Most insurances cover these services at 100%, however patients may be responsible for any copay, coinsurance and/or deductible if applicable. This service may help you avoid the need for more expensive face-to-face services. ?Only one practitioner may furnish and bill the service in a calendar month. ?The patient may stop CCM services at any time (effective at the end of the month) by phone call to the office staff. ? ?Patient agreed to services and verbal consent obtained.  ? ?Patient verbalizes understanding of instructions and care plan provided today and agrees to view in [place]. Active MyChart status confirmed with patient.   ?Telephone follow up appointment with pharmacy team member scheduled for: 6 months ? ?Charlene Brooke, PharmD, BCACP ?Clinical Pharmacist ?[facility] ?[phone]  ?

## 2021-06-11 ENCOUNTER — Telehealth: Payer: Self-pay

## 2021-06-11 NOTE — Telephone Encounter (Signed)
Spoke to pt. She said she is intolerant to atorvastatin. That is on her allergy list. She said she would try the rosuvastatin. ?

## 2021-06-11 NOTE — Telephone Encounter (Signed)
I sent generic crestore (rosuvastatin)  ?Alert [place] if problems or side effects  ?

## 2021-06-11 NOTE — Chronic Care Management (AMB) (Signed)
Pharmacy contacted and brand name Crestor is not covered by patients insurance. Only generics are covered. $300.00 for Crestor. Prior authorization could be attempted with no guarantee of coverage. ? ?Charlene Brooke, CPP notified ? ?Judah Carchi, CCMA ?Health concierge  ?[phone]  ?

## 2021-06-15 NOTE — Telephone Encounter (Signed)
Patient notified as instructed by telephone and verbalized understanding. 

## 2021-06-18 ENCOUNTER — Ambulatory Visit (AMBULATORY_SURGERY_CENTER): Payer: Medicare HMO | Admitting: *Deleted

## 2021-06-18 ENCOUNTER — Other Ambulatory Visit: Payer: Self-pay

## 2021-06-18 ENCOUNTER — Telehealth: Payer: Self-pay | Admitting: *Deleted

## 2021-06-18 VITALS — Ht 60.0 in | Wt 170.0 lb

## 2021-06-18 DIAGNOSIS — Z86010 Personal history of colonic polyps: Secondary | ICD-10-CM

## 2021-06-18 MED ORDER — PEG 3350-KCL-NA BICARB-NACL 420 G PO SOLR: 4000.0000 mL | Freq: Once | ORAL | 0 refills | Status: AC

## 2021-06-18 NOTE — Progress Notes (Signed)
Virtual pre visit completed over telephone.  Instructions mailed to home address and forwarded through [place]. ? ? ? ?No egg or soy allergy known to patient  ?No issues known to pt with past sedation with any surgeries or procedures ?Patient denies ever being told they had issues or difficulty with intubation  ?No FH of Malignant Hyperthermia ?Pt is not on diet pills ?Pt is not on  home 02  ?Pt is not on blood thinners  ?Pt denies issues with constipation  ?No A fib or A flutter ? ?Discussed with pt there will be an out-of-pocket cost for prep and that varies from $0 to 70 +  dollars - pt verbalized understanding  ?Pt instructed to use Singlecare.com or GoodRx for a price reduction on prep  ? ?PV completed over the phone. Pt verified name, DOB, address and insurance during PV today.  ?Pt mailed instruction packet with copy of consent form to read and not return, and instructions.  ?Pt encouraged to call with questions or issues.  ?If pt has My chart, procedure instructions sent via My Chart   ?

## 2021-06-18 NOTE — Telephone Encounter (Signed)
John, please review patient chart.  Patient had hx of chest pain, now resolved.  Followed by cardiologist .  Patient refused cardiac CT due to Metoprolol.  Cardiologist did not order any other testing. Please review. Thank you- Raquel Sarna ?

## 2021-06-25 ENCOUNTER — Ambulatory Visit: Payer: Medicare HMO | Admitting: Pulmonary Disease

## 2021-07-02 ENCOUNTER — Ambulatory Visit
Admission: RE | Admit: 2021-07-02 | Discharge: 2021-07-02 | Disposition: A | Discharge status: Home / Self Care | Payer: Medicare HMO | Source: Ambulatory Visit | Attending: Family Medicine | Admitting: Family Medicine

## 2021-07-02 DIAGNOSIS — Z780 Asymptomatic menopausal state: Secondary | ICD-10-CM | POA: Diagnosis not present

## 2021-07-02 DIAGNOSIS — Z1231 Encounter for screening mammogram for malignant neoplasm of breast: Secondary | ICD-10-CM | POA: Diagnosis not present

## 2021-07-02 DIAGNOSIS — M810 Age-related osteoporosis without current pathological fracture: Secondary | ICD-10-CM | POA: Diagnosis not present

## 2021-07-02 DIAGNOSIS — E2839 Other primary ovarian failure: Secondary | ICD-10-CM

## 2021-07-05 DIAGNOSIS — J449 Chronic obstructive pulmonary disease, unspecified: Secondary | ICD-10-CM | POA: Diagnosis not present

## 2021-07-05 DIAGNOSIS — E7800 Pure hypercholesterolemia, unspecified: Secondary | ICD-10-CM

## 2021-07-23 ENCOUNTER — Encounter: Payer: Self-pay | Admitting: Gastroenterology

## 2021-07-29 ENCOUNTER — Encounter: Payer: Self-pay | Admitting: Gastroenterology

## 2021-07-29 ENCOUNTER — Ambulatory Visit (AMBULATORY_SURGERY_CENTER): Payer: Medicare HMO | Admitting: Gastroenterology

## 2021-07-29 VITALS — BP 141/67 | HR 56 | Temp 97.3°F | Resp 14 | Ht 60.0 in

## 2021-07-29 DIAGNOSIS — Z86010 Personal history of colonic polyps: Secondary | ICD-10-CM | POA: Diagnosis not present

## 2021-07-29 DIAGNOSIS — K635 Polyp of colon: Secondary | ICD-10-CM

## 2021-07-29 DIAGNOSIS — D122 Benign neoplasm of ascending colon: Secondary | ICD-10-CM

## 2021-07-29 DIAGNOSIS — R195 Other fecal abnormalities: Secondary | ICD-10-CM

## 2021-07-29 MED ORDER — SODIUM CHLORIDE 0.9 % IV SOLN: 500.0000 mL | Freq: Once | INTRAVENOUS | Status: DC

## 2021-07-29 NOTE — Progress Notes (Signed)
History & Physical  Primary Care Physician:  [name], Wynelle Fanny, MD Primary Gastroenterologist: Lucio Edward, MD  CHIEF COMPLAINT:  Occult blood in stool, personal history of colon polyps   HPI: Karina [name] is a 66 y.o. female with a personal history of adenomatous colon polyps for colonoscopy.   Past Medical History:  Diagnosis Date   Allergic rhinitis    Allergy    Anxiety    [place] [facility] [place], no per pt   Cataract    Colon polyps 08/2008   Adenomatous   COPD (chronic obstructive pulmonary disease) ([place])    Depression    no per pt   Diabetes mellitus    Gastritis 08/2008   H. pylori   GERD (gastroesophageal reflux disease)    Hypercholesteremia    Lung nodule    Menopausal disorder    Multiple sclerosis (HCC)    Neuromuscular disorder (HCC)    Plantar fasciitis    Seizures ([place])    1 seizure in 2001 due to blood sugar dropping to 38, none since   Shingles    Left V1   Urinary incontinence    Vertigo     Past Surgical History:  Procedure Laterality Date   CERVICAL DISCECTOMY  2004   x 2, Anterior; Fusion C4-5, C5-6, C6-7, Dr. Kary Kos   CHEST CT  11/2008   With small 4 mm nodule L lung base (rec re check in 1 year)   CHEST CT  07/2009   Re check chest CT - lung nodule stable   CHOLECYSTECTOMY     COLONOSCOPY  08/2008   Polyps/ re check 5 yrs   CT SINUS LTD W/O CM  02/2009   negative   DILATION AND CURETTAGE OF UTERUS     ESOPHAGOGASTRODUODENOSCOPY  08/2008   Erosive gastritis, h pylori (treated)   FOOT SURGERY     left plantar fascial problem   ROTATOR CUFF REPAIR     right   TONSILLECTOMY     TUBAL LIGATION     UPPER GASTROINTESTINAL ENDOSCOPY      Prior to Admission medications   Medication Sig Start Date End Date Taking? Authorizing Provider  albuterol (VENTOLIN HFA) 108 (90 Base) MCG/ACT inhaler INHALE 2 PUFFS UP TO EVERY 4 HOURS AS NEEDED FOR WHEEZING 06/10/21  Yes [name], Wynelle Fanny, MD  aspirin EC 81 MG tablet Take 1 tablet (81 mg total)  by mouth daily. Swallow whole. 04/17/21  Yes Gollan, Kathlene November, MD  Blood Glucose Monitoring Suppl (ACCU-CHEK AVIVA PLUS) w/Device KIT Check blood sugar once daily and as directed Dx R73.09 08/19/15  Yes [name], Wynelle Fanny, MD  gabapentin (NEURONTIN) 300 MG capsule TAKE 1 CAPSULE BY MOUTH TWICE DURING THE DAY AND 3 CAPSULES BY MOUTH AT NIGHT Patient taking differently: Take 600 mg by mouth at bedtime. TAKE 1 CAPSULE BY MOUTH TWICE DURING THE DAY AND 3 CAPSULES BY MOUTH AT NIGHT 06/01/21  Yes Melvenia Beam, MD  Multiple Vitamin (MULTIVITAMIN) tablet Take 1 tablet by mouth daily.   Yes [provider]  ALPRAZolam (XANAX) 0.5 MG tablet Take 1 tablet (0.5 mg total) by mouth 2 (two) times daily as needed. For anxiety. 02/11/15   [name], Wynelle Fanny, MD  budesonide-formoterol [facility]) 160-4.5 MCG/ACT inhaler Inhale 2 puffs into the lungs 2 (two) times daily. 01/08/21   Chesley Mires, MD  cholecalciferol (VITAMIN D3) 25 MCG (1000 UNIT) tablet Take 1,000 Units by mouth daily.    [provider]  Diclofenac Sodium  3 % GEL Place 1 application onto the skin every 12 (twelve) hours as needed. 06/18/16   Orbie Pyo, MD  fluticasone (FLONASE) 50 MCG/ACT nasal spray Place 1 spray into both nostrils daily. 01/08/21   Chesley Mires, MD  ipratropium-albuterol (DUONEB) 0.5-2.5 (3) MG/3ML SOLN TAKE 3 MLS BY NEBULIZATION EVERY 6 (SIX) HOURS AS NEEDED. 09/05/18   Laverle Hobby, MD  pantoprazole (PROTONIX) 40 MG tablet Take 40 mg by mouth daily as needed. 09/05/19   [provider]  rosuvastatin (CRESTOR) 20 MG tablet Take 1 tablet (20 mg total) by mouth daily. Patient not taking: Reported on 06/18/2021 06/11/21   Abner Greenspan, MD  Spacer/Aero-Holding Chambers (AEROCHAMBER MV) inhaler Use as instructed 01/08/21   Chesley Mires, MD    Current Outpatient Medications  Medication Sig Dispense Refill   albuterol (VENTOLIN HFA) 108 (90 Base) MCG/ACT inhaler INHALE 2 PUFFS UP TO EVERY 4 HOURS AS  NEEDED FOR WHEEZING 3 each 3   aspirin EC 81 MG tablet Take 1 tablet (81 mg total) by mouth daily. Swallow whole. 90 tablet 3   Blood Glucose Monitoring Suppl (ACCU-CHEK AVIVA PLUS) w/Device KIT Check blood sugar once daily and as directed Dx R73.09 1 kit 0   gabapentin (NEURONTIN) 300 MG capsule TAKE 1 CAPSULE BY MOUTH TWICE DURING THE DAY AND 3 CAPSULES BY MOUTH AT NIGHT (Patient taking differently: Take 600 mg by mouth at bedtime. TAKE 1 CAPSULE BY MOUTH TWICE DURING THE DAY AND 3 CAPSULES BY MOUTH AT NIGHT) 450 capsule 3   Multiple Vitamin (MULTIVITAMIN) tablet Take 1 tablet by mouth daily.     ALPRAZolam (XANAX) 0.5 MG tablet Take 1 tablet (0.5 mg total) by mouth 2 (two) times daily as needed. For anxiety. 30 tablet 0   budesonide-formoterol (SYMBICORT) 160-4.5 MCG/ACT inhaler Inhale 2 puffs into the lungs 2 (two) times daily. 3 each 2   cholecalciferol (VITAMIN D3) 25 MCG (1000 UNIT) tablet Take 1,000 Units by mouth daily.     Diclofenac Sodium 3 % GEL Place 1 application onto the skin every 12 (twelve) hours as needed. 50 g 0   fluticasone (FLONASE) 50 MCG/ACT nasal spray Place 1 spray into both nostrils daily. 16 g 2   ipratropium-albuterol (DUONEB) 0.5-2.5 (3) MG/3ML SOLN TAKE 3 MLS BY NEBULIZATION EVERY 6 (SIX) HOURS AS NEEDED. 360 mL 0   pantoprazole (PROTONIX) 40 MG tablet Take 40 mg by mouth daily as needed.     rosuvastatin (CRESTOR) 20 MG tablet Take 1 tablet (20 mg total) by mouth daily. (Patient not taking: Reported on 06/18/2021) 90 tablet 3   Spacer/Aero-Holding Chambers (AEROCHAMBER MV) inhaler Use as instructed 1 each 0   Current Facility-Administered Medications  Medication Dose Route Frequency Provider Last Rate Last Admin   0.9 %  sodium chloride infusion  500 mL Intravenous Once Ladene Artist, MD        Allergies as of 07/29/2021 - Review Complete 07/29/2021  Allergen Reaction Noted   Percocet [oxycodone-acetaminophen] Shortness Of Breath 10/12/2010   Tecfidera  [dimethyl fumarate] Shortness Of Breath, Palpitations, Rash, and Cough 10/22/2011   Amitriptyline hcl Itching and Swelling 06/30/2007   Atorvastatin Other (See Comments) 09/30/2006   Betaseron [interferon beta-1b] Other (See Comments) 02/18/2015   Clarithromycin Other (See Comments) 09/30/2006   Duloxetine Other (See Comments) 09/30/2006   Pregabalin Other (See Comments) 07/23/2008   Pseudoephedrine Other (See Comments) 09/30/2006   Zoloft [sertraline hcl] Other (See Comments) 09/20/2011   Levofloxacin Rash 02/13/2007  Family History  Problem Relation Age of Onset   Migraines Mother    Heart attack Mother    Coronary artery disease Mother    Coronary artery disease Father        6 bypasses    Stroke Father    Diabetes Father    Renal Disease Father    Colon cancer Maternal Grandfather 69   Coronary artery disease Cousin    Coronary artery disease Paternal Grandmother    Lung cancer Paternal Aunt    Multiple sclerosis Neg Hx    Esophageal cancer Neg Hx    Rectal cancer Neg Hx    Stomach cancer Neg Hx    Breast cancer Neg Hx     Social History   Socioeconomic History   Marital status: Married    Spouse name: Not on file   Number of children: 3   Years of education: 9 th   Highest education level: Not on file  Occupational History   Occupation: Disabled     Employer: DISABLED  Tobacco Use   Smoking status: Every Day    Packs/day: 2.00    Years: 30.00    Pack years: 60.00    Types: Cigarettes    Last attempt to quit: 06/17/2019    Years since quitting: 2.1   Smokeless tobacco: Never   Tobacco comments:    0.5ppd - 03/25/2021  Vaping Use   Vaping Use: Never used  Substance and Sexual Activity   Alcohol use: No    Alcohol/week: 0.0 standard drinks   Drug use: No   Sexual activity: Not on file  Other Topics Concern   Not on file  Social History Narrative   Married with 3 children   Daily caffeine use - 2    Disabled.   Education 9th grade    Caffeine one  cup of coffee daily.   Patient is right handed.    Social Determinants of Health   Financial Resource Strain: Low Risk    Difficulty of Paying Living Expenses: Not very hard  Food Insecurity: No Food Insecurity   Worried About Charity fundraiser in the Last Year: Never true   Ran Out of Food in the Last Year: Never true  Transportation Needs: Not on file  Physical Activity: Not on file  Stress: Not on file  Social Connections: Not on file  Intimate Partner Violence: Not on file    Review of Systems:  All systems reviewed an negative except where noted in HPI.  Gen: Denies any fever, chills, sweats, anorexia, fatigue, weakness, malaise, weight loss, and sleep disorder CV: Denies chest pain, angina, palpitations, syncope, orthopnea, PND, peripheral edema, and claudication. Resp: Denies dyspnea at rest, dyspnea with exercise, cough, sputum, wheezing, coughing up blood, and pleurisy. GI: Denies vomiting blood, jaundice, and fecal incontinence.   Denies dysphagia or odynophagia. GU : Denies urinary burning, blood in urine, urinary frequency, urinary hesitancy, nocturnal urination, and urinary incontinence. MS: Denies joint pain, limitation of movement, and swelling, stiffness, low back pain, extremity pain. Denies muscle weakness, cramps, atrophy.  Derm: Denies rash, itching, dry skin, hives, moles, warts, or unhealing ulcers.  Psych: Denies depression, anxiety, memory loss, suicidal ideation, hallucinations, paranoia, and confusion. Heme: Denies bruising, bleeding, and enlarged lymph nodes. Neuro:  Denies any headaches, dizziness, paresthesias. Endo:  Denies any problems with DM, thyroid, adrenal function.   Physical Exam:  eneral:  Alert, well-developed, in NAD Head:  Normocephalic and atraumatic. Eyes:  Sclera clear, no icterus.  Conjunctiva pink. Ears:  Normal auditory acuity. Mouth:  No deformity or lesions.  Neck:  Supple; no masses . Lungs:  Clear throughout to  auscultation.   No wheezes, crackles, or rhonchi. No acute distress. Heart:  Regular rate and rhythm; no murmurs. Abdomen:  Soft, nondistended, nontender. No masses, hepatomegaly. No obvious masses.  Normal bowel .    Rectal:  Deferred   Msk:  Symmetrical without gross deformities.. Pulses:  Normal pulses noted. Extremities:  Without edema. Neurologic:  Alert and  oriented x4;  grossly normal neurologically. Skin:  Intact without significant lesions or rashes. Cervical Nodes:  No significant cervical adenopathy. Psych:  Alert and cooperative. Normal mood and affect.    Impression / Plan:   Occult blood in stool and a personal history of adenomatous colon polyps for colonoscopy.  Pricilla Riffle. Fuller Plan  07/29/2021, 1:29 PM See Shea Evans, [facility], to contact our on call provider

## 2021-07-29 NOTE — Progress Notes (Signed)
Pt's states no medical or surgical changes since previsit or office visit.   Pt reports no further chest pain since her ED visit in February.

## 2021-07-29 NOTE — Patient Instructions (Signed)
Handouts Provided:  Polyps and Diverticulosis ° °YOU HAD AN ENDOSCOPIC PROCEDURE TODAY AT THE [facility]:   Refer to the procedure report that was given to you for any specific questions about what was found during the examination.  If the procedure report does not answer your questions, please call your gastroenterologist to clarify.  If you requested that your care partner not be given the details of your procedure findings, then the procedure report has been included in a sealed envelope for you to review at your convenience later. ° °YOU SHOULD EXPECT: Some feelings of bloating in the abdomen. Passage of more gas than usual.  Walking can help get rid of the air that was put into your GI tract during the procedure and reduce the bloating. If you had a lower endoscopy (such as a colonoscopy or flexible sigmoidoscopy) you may notice spotting of blood in your stool or on the toilet paper. If you underwent a bowel prep for your procedure, you may not have a normal bowel movement for a few days. ° °Please Note:  You might notice some irritation and congestion in your nose or some drainage.  This is from the oxygen used during your procedure.  There is no need for concern and it should clear up in a day or so. ° °SYMPTOMS TO REPORT IMMEDIATELY: ° °Following lower endoscopy (colonoscopy or flexible sigmoidoscopy): ° Excessive amounts of blood in the stool ° Significant tenderness or worsening of abdominal pains ° Swelling of the abdomen that is new, acute ° Fever of 100°F or higher ° °For urgent or emergent issues, a gastroenterologist can be reached at any hour by calling [phone]. °Do not use MyChart messaging for urgent concerns.  ° ° °DIET:  We do recommend a small meal at first, but then you may proceed to your regular diet.  Drink plenty of fluids but you should avoid alcoholic beverages for 24 hours. ° °ACTIVITY:  You should plan to take it easy for the rest of today and you should NOT DRIVE  or use heavy machinery until tomorrow (because of the sedation medicines used during the test).   ° °FOLLOW UP: °Our staff will call the number listed on your records 48-72 hours following your procedure to check on you and address any questions or concerns that you may have regarding the information given to you following your procedure. If we do not reach you, we will leave a message.  We will attempt to reach you two times.  During this call, we will ask if you have developed any symptoms of COVID 19. If you develop any symptoms (ie: fever, flu-like symptoms, shortness of breath, cough etc.) before then, please call [phone].  If you test positive for Covid 19 in the 2 weeks post procedure, please call and report this information to us.   ° °If any biopsies were taken you will be contacted by phone or by letter within the next 1-3 weeks.  Please call us at [phone] if you have not heard about the biopsies in 3 weeks.  ° ° °SIGNATURES/CONFIDENTIALITY: °You and/or your care partner have signed paperwork which will be entered into your electronic medical record.  These signatures attest to the fact that that the information above on your After Visit Summary has been reviewed and is understood.  Full responsibility of the confidentiality of this discharge information lies with you and/or your care-partner. ° °

## 2021-07-29 NOTE — Progress Notes (Signed)
Called to room to assist during endoscopic procedure.  Patient ID and intended procedure confirmed with present staff. Received instructions for my participation in the procedure from the performing physician.  

## 2021-07-29 NOTE — Op Note (Signed)
[place] Patient Name: Karina [name] Procedure Date: 07/29/2021 12:45 PM MRN: [id number] Endoscopist: Ladene Artist , MD Age: 66 Referring MD:  Date of Birth: 11-17-1955 Gender: Female Account #: [id number] Procedure:                Colonoscopy Indications:              Heme positive stool, Personal history of                            adenomatous colon polyps Medicines:                Monitored Anesthesia Care Procedure:                Pre-Anesthesia Assessment:                           - Prior to the procedure, a History and Physical                            was performed, and patient medications and                            allergies were reviewed. The patient's tolerance of                            previous anesthesia was also reviewed. The risks                            and benefits of the procedure and the sedation                            options and risks were discussed with the patient.                            All questions were answered, and informed consent                            was obtained. Prior Anticoagulants: The patient has                            taken no previous anticoagulant or antiplatelet                            agents. ASA Grade Assessment: III - A patient with                            severe systemic disease. After reviewing the risks                            and benefits, the patient was deemed in                            satisfactory condition to undergo the procedure.  After obtaining informed consent, the colonoscope                            was passed under direct vision. Throughout the                            procedure, the patient's blood pressure, pulse, and                            oxygen saturations were monitored continuously. The                            Olympus #[id number] was introduced through the anus                            and advanced to the the cecum,  identified by                            appendiceal orifice and ileocecal valve. The                            ileocecal valve, appendiceal orifice, and rectum                            were photographed. The quality of the bowel                            preparation was good. The colonoscopy was performed                            without difficulty. The patient tolerated the                            procedure well. Scope In: 1:45:58 PM Scope Out: 2:03:45 PM Scope Withdrawal Time: 0 hours 15 minutes 36 seconds  Total Procedure Duration: 0 hours 17 minutes 47 seconds  Findings:                 The perianal and digital rectal examinations were                            normal.                           A 9 mm polyp was found in the ascending colon. The                            polyp was sessile. The polyp was removed with a                            cold snare. Resection and retrieval were complete.                           A few small-mouthed diverticula were found in the  left colon. There was no evidence of diverticular                            bleeding.                           External hemorrhoids were found during                            retroflexion. The hemorrhoids were small.                           The exam was otherwise without abnormality on                            direct and retroflexion views. Complications:            No immediate complications. Estimated blood loss:                            None. Estimated Blood Loss:     Estimated blood loss: none. Impression:               - One 9 mm polyp in the ascending colon, removed                            with a cold snare. Resected and retrieved.                           - Mild diverticulosis in the left colon.                           - External hemorrhoids.                           - The examination was otherwise normal on direct                            and  retroflexion views. Recommendation:           - Repeat colonoscopy after studies are complete for                            surveillance based on pathology results.                           - Patient has a contact number available for                            emergencies. The signs and symptoms of potential                            delayed complications were discussed with the                            patient. Return to normal activities tomorrow.  Written discharge instructions were provided to the                            patient.                           - Resume previous diet.                           - Continue present medications.                           - Await pathology results. Ladene Artist, MD 07/29/2021 2:07:57 PM This report has been signed electronically.

## 2021-07-29 NOTE — Progress Notes (Signed)
PT taken to PACU. Monitors in place. VSS. Report given to RN. 

## 2021-07-30 ENCOUNTER — Telehealth: Payer: Self-pay

## 2021-07-30 ENCOUNTER — Telehealth: Payer: Self-pay | Admitting: *Deleted

## 2021-07-30 NOTE — Telephone Encounter (Signed)
Attempted 2nd f/u phone call. No answer. Left message.  °

## 2021-07-30 NOTE — Telephone Encounter (Signed)
  Follow up Call-     07/29/2021   12:46 PM  Call back number  Post procedure Call Back phone  # [phone]  Permission to leave phone message Yes    Post op call attempted, no answer, left WM.

## 2021-08-10 ENCOUNTER — Encounter: Payer: Self-pay | Admitting: Gastroenterology

## 2021-08-14 ENCOUNTER — Ambulatory Visit (INDEPENDENT_AMBULATORY_CARE_PROVIDER_SITE_OTHER)
Admission: RE | Admit: 2021-08-14 | Discharge: 2021-08-14 | Disposition: A | Discharge status: Home / Self Care | Payer: Medicare HMO | Source: Ambulatory Visit | Attending: Family Medicine | Admitting: Family Medicine

## 2021-08-14 ENCOUNTER — Ambulatory Visit (INDEPENDENT_AMBULATORY_CARE_PROVIDER_SITE_OTHER): Payer: Medicare HMO | Admitting: Family Medicine

## 2021-08-14 ENCOUNTER — Encounter: Payer: Self-pay | Admitting: Family Medicine

## 2021-08-14 VITALS — BP 130/82 | HR 82 | Ht 60.0 in | Wt 185.0 lb

## 2021-08-14 DIAGNOSIS — M25512 Pain in left shoulder: Secondary | ICD-10-CM | POA: Diagnosis not present

## 2021-08-14 DIAGNOSIS — M19011 Primary osteoarthritis, right shoulder: Secondary | ICD-10-CM | POA: Diagnosis not present

## 2021-08-14 DIAGNOSIS — R519 Headache, unspecified: Secondary | ICD-10-CM

## 2021-08-14 DIAGNOSIS — G8929 Other chronic pain: Secondary | ICD-10-CM

## 2021-08-14 DIAGNOSIS — M546 Pain in thoracic spine: Secondary | ICD-10-CM

## 2021-08-14 DIAGNOSIS — M47814 Spondylosis without myelopathy or radiculopathy, thoracic region: Secondary | ICD-10-CM | POA: Diagnosis not present

## 2021-08-14 DIAGNOSIS — M47812 Spondylosis without myelopathy or radiculopathy, cervical region: Secondary | ICD-10-CM | POA: Diagnosis not present

## 2021-08-14 MED ORDER — PREDNISONE 10 MG PO TABS: ORAL_TABLET | ORAL | 0 refills | Status: DC

## 2021-08-14 MED ORDER — TRAMADOL HCL 50 MG PO TABS: 50.0000 mg | ORAL_TABLET | Freq: Three times a day (TID) | ORAL | 0 refills | Status: AC | PRN

## 2021-08-14 NOTE — Assessment & Plan Note (Signed)
Acute on chronic , L sided Also in back of head Reassuring exam  Xray TS and CS ordered  Prednisone 40 mg taper  Tramadol to use with caution of sedation and habit   H/o DDD  Last mri of TS was 2010  Has seen ortho in the past  ? If related to head and L shoulder pain

## 2021-08-14 NOTE — Assessment & Plan Note (Signed)
Pain is more in scapula than shoulder  rom is fair for pt with past rot cuff surgery  Mild acromial tenderness Xray ordered

## 2021-08-14 NOTE — Patient Instructions (Signed)
Xrays today   Try tramadol with caution of sedation and constipation   Prednisone 40 mg taper    Plan to follow

## 2021-08-14 NOTE — Progress Notes (Signed)
Subjective:    Patient ID: Karina [name], female    DOB: Jul 06, 1955, 66 y.o.   MRN: [id number]  HPI Pt presents for pain in shoulder blade area   Wt Readings from Last 3 Encounters:  08/14/21 185 lb (83.9 kg)  06/18/21 170 lb (77.1 kg)  05/07/21 174 lb 8 oz (79.2 kg)   36.13 kg/m  Her R shoulder blade hurts bad again  A little in shoulder as well  Back of head hurts also   No rash   Hurts to wear a bra  Heat /ice  Epsom salts  Heating pad  Advil Voltaren gel  Tylenol   Not helping   This is recurrent  When she uses her R arm a lot it gets worse  Neck does not hurt   No numbness  Hand and arm tend to shake    H/o DDD lumbar and cervical ([facility] then pain man referral in 2019)  Cervical disectomy  and fusion in the past  RA OA Rotator cuff issues - R repair in the past    Last MRI of TS was 2010 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 If prednisone helps  Tramadol -had in   Would consider pain clinic ref to [facility]  Sister goes there  Patient Active Problem List   Diagnosis Date Noted   Left shoulder pain 08/14/2021   Occult blood positive stool 05/25/2021   Colon cancer screening 05/07/2021   Estrogen deficiency 05/07/2021   Acute bronchitis with COPD ([place]) 03/31/2021   Rheumatoid arthritis ([place]) 03/31/2021   Coronary artery calcification 03/31/2021   Hypokalemia 08/27/2020   Joint pain in both hands 10/12/2019   Head pain 08/22/2019   Elevated serum creatinine 04/24/2019   Frequent urination 04/20/2019   Decreased appetite 04/20/2019   Encounter for screening mammogram for breast cancer 04/20/2019   Seborrheic keratosis, inflamed 12/15/2017   B12 deficiency 08/24/2017   Left low back pain 08/24/2017   Erosive osteoarthritis of both hands 09/09/2016   Fatigue  09/07/2016   Bilateral hand pain 09/07/2016   Routine general medical examination at a health care facility 05/06/2015   Large breasts 05/06/2015   Lumbar disc disease 11/01/2014   Knee pain, bilateral 03/11/2014   Thoracic back pain 11/30/2013   Encounter for routine gynecological examination 01/31/2013   Encounter for Medicare annual wellness exam 01/23/2013   Chest pain 09/20/2012   Dyspnea 09/13/2012   Asthmatic bronchitis , chronic (HCC) 12/06/2011   Chronic cough 11/22/2011   Anxiety disorder 07/17/2009   HEMORRHOIDS-EXTERNAL 11/07/2008   IRRITABLE BOWEL SYNDROME 11/07/2008   Lung nodule 10/14/2008   Prediabetes 10/14/2008   PERSONAL HX COLONIC POLYPS 10/08/2008   Vitamin D deficiency 07/23/2008   SYNCOPE, HX OF 06/01/2008   FOOT SURGERY, HX OF 06/01/2008   DILATION AND CURETTAGE, HX OF 06/01/2008   OSTEOARTHRITIS, SHOULDER, RIGHT 03/22/2008   ROTATOR CUFF SYNDROME, RIGHT 03/22/2008   ARTHRALGIA 12/12/2007   Allergic rhinitis 08/01/2007   Current smoker 10/17/2006   Peripheral neuropathy 10/17/2006   FOOT PAIN, BILATERAL 10/17/2006   HYPOGLYCEMIA 09/30/2006   HYPERCHOLESTEROLEMIA 09/30/2006   MULTIPLE SCLEROSIS 09/30/2006   GERD 09/30/2006   FIBROCYSTIC BREAST DISEASE 09/30/2006   Past Medical History:  Diagnosis Date   Allergic rhinitis    Allergy  Anxiety    [facility]- [facility], no per pt   Cataract    Colon polyps 08/2008   Adenomatous   COPD (chronic obstructive pulmonary disease) (HCC)    Depression    no per pt   Diabetes mellitus    Gastritis 08/2008   H. pylori   GERD (gastroesophageal reflux disease)    Hypercholesteremia    Lung nodule    Menopausal disorder    Multiple sclerosis (HCC)    Neuromuscular disorder (HCC)    Plantar fasciitis    Seizures (HCC)    1 seizure in 2001 due to blood sugar dropping to 38, none since   Shingles    Left V1   Urinary incontinence    Vertigo    Past Surgical History:  Procedure Laterality Date    CERVICAL DISCECTOMY  2004   x 2, Anterior; Fusion C4-5, C5-6, C6-7, Dr. Donalee Citrin   CHEST CT  11/2008   With small 4 mm nodule L lung base (rec re check in 1 year)   CHEST CT  07/2009   Re check chest CT - lung nodule stable   CHOLECYSTECTOMY     COLONOSCOPY  08/2008   Polyps/ re check 5 yrs   CT SINUS LTD W/O CM  02/2009   negative   DILATION AND CURETTAGE OF UTERUS     ESOPHAGOGASTRODUODENOSCOPY  08/2008   Erosive gastritis, h pylori (treated)   FOOT SURGERY     left plantar fascial problem   ROTATOR CUFF REPAIR     right   TONSILLECTOMY     TUBAL LIGATION     UPPER GASTROINTESTINAL ENDOSCOPY     Social History   Tobacco Use   Smoking status: Former    Packs/day: 2.00    Years: 30.00    Total pack years: 60.00    Types: Cigarettes    Quit date: 06/17/2019    Years since quitting: 2.1   Smokeless tobacco: Never   Tobacco comments:    0.5ppd - 03/25/2021  Vaping Use   Vaping Use: Never used  Substance Use Topics   Alcohol use: No    Alcohol/week: 0.0 standard drinks of alcohol   Drug use: No   Family History  Problem Relation Age of Onset   Migraines Mother    Heart attack Mother    Coronary artery disease Mother    Coronary artery disease Father        6 bypasses    Stroke Father    Diabetes Father    Renal Disease Father    Colon cancer Maternal Grandfather 52   Coronary artery disease Cousin    Coronary artery disease Paternal Grandmother    Lung cancer Paternal Aunt    Multiple sclerosis Neg Hx    Esophageal cancer Neg Hx    Rectal cancer Neg Hx    Stomach cancer Neg Hx    Breast cancer Neg Hx    Allergies  Allergen Reactions   Percocet [Oxycodone-Acetaminophen] Shortness Of Breath    Felt like going to pass out and sweating   Tecfidera [Dimethyl Fumarate] Shortness Of Breath, Palpitations, Rash and Cough   Amitriptyline Hcl Itching and Swelling   Atorvastatin Other (See Comments)    elevated LFT's   Betaseron [Interferon Beta-1b] Other (See  Comments)    Headache   Clarithromycin Other (See Comments)    reaction not known   Duloxetine Other (See Comments)    pain   Pregabalin Other (See Comments)  LE swelling   Pseudoephedrine Other (See Comments)    legs hurt   Zoloft [Sertraline Hcl] Other (See Comments)    Headaches    Levofloxacin Rash   Current Outpatient Medications on File Prior to Visit  Medication Sig Dispense Refill   albuterol (VENTOLIN HFA) 108 (90 Base) MCG/ACT inhaler INHALE 2 PUFFS UP TO EVERY 4 HOURS AS NEEDED FOR WHEEZING 3 each 3   ALPRAZolam (XANAX) 0.5 MG tablet Take 1 tablet (0.5 mg total) by mouth 2 (two) times daily as needed. For anxiety. 30 tablet 0   aspirin EC 81 MG tablet Take 1 tablet (81 mg total) by mouth daily. Swallow whole. 90 tablet 3   Blood Glucose Monitoring Suppl (ACCU-CHEK AVIVA PLUS) w/Device KIT Check blood sugar once daily and as directed Dx R73.09 1 kit 0   budesonide-formoterol (SYMBICORT) 160-4.5 MCG/ACT inhaler Inhale 2 puffs into the lungs 2 (two) times daily. 3 each 2   cholecalciferol (VITAMIN D3) 25 MCG (1000 UNIT) tablet Take 1,000 Units by mouth daily.     Diclofenac Sodium 3 % GEL Place 1 application onto the skin every 12 (twelve) hours as needed. 50 g 0   fluticasone (FLONASE) 50 MCG/ACT nasal spray Place 1 spray into both nostrils daily. 16 g 2   gabapentin (NEURONTIN) 300 MG capsule TAKE 1 CAPSULE BY MOUTH TWICE DURING THE DAY AND 3 CAPSULES BY MOUTH AT NIGHT (Patient taking differently: Take 600 mg by mouth at bedtime. TAKE 1 CAPSULE BY MOUTH TWICE DURING THE DAY AND 3 CAPSULES BY MOUTH AT NIGHT) 450 capsule 3   ipratropium-albuterol (DUONEB) 0.5-2.5 (3) MG/3ML SOLN TAKE 3 MLS BY NEBULIZATION EVERY 6 (SIX) HOURS AS NEEDED. 360 mL 0   Multiple Vitamin (MULTIVITAMIN) tablet Take 1 tablet by mouth daily.     pantoprazole (PROTONIX) 40 MG tablet Take 40 mg by mouth daily as needed.     rosuvastatin (CRESTOR) 20 MG tablet Take 1 tablet (20 mg total) by mouth daily. 90  tablet 3   Spacer/Aero-Holding Chambers (AEROCHAMBER MV) inhaler Use as instructed 1 each 0   No current facility-administered medications on file prior to visit.     Review of Systems  Constitutional:  Negative for activity change, appetite change, fatigue, fever and unexpected weight change.  HENT:  Negative for congestion, ear pain, rhinorrhea, sinus pressure and sore throat.   Eyes:  Negative for pain, redness and visual disturbance.  Respiratory:  Negative for cough, shortness of breath and wheezing.   Cardiovascular:  Negative for chest pain and palpitations.  Gastrointestinal:  Negative for abdominal pain, blood in stool, constipation and diarrhea.  Endocrine: Negative for polydipsia and polyuria.  Genitourinary:  Negative for dysuria, frequency and urgency.  Musculoskeletal:  Positive for arthralgias and back pain. Negative for myalgias, neck pain and neck stiffness.  Skin:  Negative for pallor and rash.  Allergic/Immunologic: Negative for environmental allergies.  Neurological:  Positive for tremors and headaches. Negative for dizziness, syncope, speech difficulty and numbness.       Chronic tremor R hand and arm   Hematological:  Negative for adenopathy. Does not bruise/bleed easily.  Psychiatric/Behavioral:  Negative for decreased concentration and dysphoric mood. The patient is not nervous/anxious.        Objective:   Physical Exam Constitutional:      General: She is not in acute distress.    Appearance: Normal appearance. She is well-developed. She is obese. She is not ill-appearing or diaphoretic.  HENT:     Head:  Normocephalic and atraumatic.     Right Ear: Tympanic membrane and ear canal normal.     Left Ear: Tympanic membrane and ear canal normal.     Mouth/Throat:     Mouth: Mucous membranes are moist.  Eyes:     General: No scleral icterus.       Right eye: No discharge.        Left eye: No discharge.     Conjunctiva/sclera: Conjunctivae normal.      Pupils: Pupils are equal, round, and reactive to light.  Neck:     Thyroid: No thyromegaly.     Vascular: No carotid bruit or JVD.     Comments: Some tenderness in L trap area  Also back of head   Scapula pain worsens with neck flexion and rotation Some crepitus   Cardiovascular:     Rate and Rhythm: Normal rate and regular rhythm.     Heart sounds: Normal heart sounds.     No gallop.  Pulmonary:     Effort: Pulmonary effort is normal. No respiratory distress.     Breath sounds: Normal breath sounds. No wheezing or rales.  Abdominal:     General: There is no distension or abdominal bruit.     Palpations: Abdomen is soft.  Musculoskeletal:     Right shoulder: Tenderness present. No swelling, deformity, effusion or crepitus. Normal range of motion. Normal strength. Normal pulse.     Cervical back: Normal range of motion and neck supple. Crepitus present. No swelling, deformity, rigidity, spasms, torticollis or tenderness. Normal range of motion.     Right lower leg: No edema.     Left lower leg: No edema.     Comments: Some discomfort with Hawking's on the R Good rom  Hurts to totally internally rotate   Lymphadenopathy:     Cervical: No cervical adenopathy.  Skin:    General: Skin is warm and dry.     Coloration: Skin is not pale.     Findings: No rash.  Neurological:     Mental Status: She is alert.     Coordination: Coordination normal.     Deep Tendon Reflexes: Reflexes are normal and symmetric. Reflexes normal.  Psychiatric:        Mood and Affect: Mood normal.           Assessment & Plan:   Problem List Items Addressed This Visit       Other   Head pain    Acute on chronic with work up  Discomfort is more midline and R than in the past No change in exam  Sees neurology No imp with muscle relaxer in the past  H/o cervical fusion   Xray CS Prednisone taper  Tramadol prn  Will report back       Relevant Medications   traMADol (ULTRAM) 50 MG tablet    Other Relevant Orders   DG Cervical Spine Complete (Completed)   DG Thoracic Spine W/Swimmers (Completed)   Left shoulder pain    Pain is more in scapula than shoulder  rom is fair for pt with past rot cuff surgery  Mild acromial tenderness Xray ordered       Relevant Orders   DG Shoulder Right (Completed)   Thoracic back pain - Primary    Acute on chronic , L sided Also in back of head Reassuring exam  Xray TS and CS ordered  Prednisone 40 mg taper  Tramadol to use with caution of sedation and  habit   H/o DDD  Last mri of TS was 2010  Has seen ortho in the past  ? If related to head and L shoulder pain       Relevant Medications   predniSONE (DELTASONE) 10 MG tablet   traMADol (ULTRAM) 50 MG tablet   Other Relevant Orders   DG Cervical Spine Complete (Completed)   DG Thoracic Spine W/Swimmers (Completed)

## 2021-08-14 NOTE — Assessment & Plan Note (Signed)
Acute on chronic with work up  Discomfort is more midline and R than in the past No change in exam  Sees neurology No imp with muscle relaxer in the past  H/o cervical fusion   Xray CS Prednisone taper  Tramadol prn  Will report back

## 2021-08-18 ENCOUNTER — Telehealth: Payer: Self-pay | Admitting: Family Medicine

## 2021-08-18 NOTE — Telephone Encounter (Signed)
Calling back for xray results

## 2021-08-19 NOTE — Telephone Encounter (Signed)
Patient called back see results notes for documentation.

## 2021-08-19 NOTE — Telephone Encounter (Signed)
LMTCB

## 2021-08-21 ENCOUNTER — Ambulatory Visit
Admission: EM | Admit: 2021-08-21 | Discharge: 2021-08-21 | Disposition: A | Discharge status: Home / Self Care | Payer: Medicare HMO

## 2021-08-21 ENCOUNTER — Encounter: Payer: Self-pay | Admitting: Emergency Medicine

## 2021-08-21 DIAGNOSIS — R519 Headache, unspecified: Secondary | ICD-10-CM

## 2021-08-21 NOTE — Discharge Instructions (Addendum)
Take Tylenol or ibuprofen as needed for discomfort.  Follow-up with your primary care provider on Monday.  Go to the emergency department if you have worsening symptoms.

## 2021-08-21 NOTE — ED Provider Notes (Signed)
[name]    CSN: [id number] Arrival date & time: 08/21/21  1354      History   Chief Complaint Chief Complaint  Patient presents with   Headache    HPI Karina [name] is a 66 y.o. female.  Patient presents with headache in the back of her head x 3 weeks.  No falls or injury.  No fever, chills, rash, wounds, redness, dizziness, numbness, weakness, chest pain, shortness of breath, or other symptoms.  Patient is concerned that she may need an antibiotic because she had similar headache last year and needed antibiotics for pneumonia.  (She was seen at [facility] ED on 08/16/2018 for chronic intractable headache and fever.  She was seen at [facility] ED on 08/18/2020 and diagnosed with pneumonia and abdominal pain.)  Patient was seen by her PCP on 08/14/2021; diagnosed with chronic bilateral thoracic back pain, chronic non-intractable headache, acute pain of left shoulder; x-rays showed no acute bony abnormalities; treated with prednisone and tramadol.  Patient states these medications have helped her shoulder pain but not her headache.  Goal history includes multiple sclerosis, COPD, current everyday smoker, peripheral neuropathy, prediabetes, osteoarthritis, irritable bowel syndrome, GERD, anxiety, rotator cuff syndrome, bilateral foot pain, lumbar disc disease, fatigue, syncope.  The history is provided by the patient and medical records.    Past Medical History:  Diagnosis Date   Allergic rhinitis    Allergy    Anxiety    [place], no per pt   Cataract    Colon polyps 08/2008   Adenomatous   COPD (chronic obstructive pulmonary disease) ([place])    Depression    no per pt   Diabetes mellitus    Gastritis 08/2008   H. pylori   GERD (gastroesophageal reflux disease)    Hypercholesteremia    Lung nodule    Menopausal disorder    Multiple sclerosis (HCC)    Neuromuscular disorder (HCC)    Plantar fasciitis    Seizures ([place])    1 seizure in 2001 due to blood sugar  dropping to 38, none since   Shingles    Left V1   Urinary incontinence    Vertigo     Patient Active Problem List   Diagnosis Date Noted   Left shoulder pain 08/14/2021   Occult blood positive stool 05/25/2021   Colon cancer screening 05/07/2021   Estrogen deficiency 05/07/2021   Acute bronchitis with COPD ([place]) 03/31/2021   Rheumatoid arthritis ([place]) 03/31/2021   Coronary artery calcification 03/31/2021   Hypokalemia 08/27/2020   Joint pain in both hands 10/12/2019   Head pain 08/22/2019   Elevated serum creatinine 04/24/2019   Frequent urination 04/20/2019   Decreased appetite 04/20/2019   Encounter for screening mammogram for breast cancer 04/20/2019   Seborrheic keratosis, inflamed 12/15/2017   B12 deficiency 08/24/2017   Left low back pain 08/24/2017   Erosive osteoarthritis of both hands 09/09/2016   Fatigue 09/07/2016   Bilateral hand pain 09/07/2016   Routine general medical examination at a health care facility 05/06/2015   Large breasts 05/06/2015   Lumbar disc disease 11/01/2014   Knee pain, bilateral 03/11/2014   Thoracic back pain 11/30/2013   Encounter for routine gynecological examination 01/31/2013   Encounter for Medicare annual wellness exam 01/23/2013   Chest pain 09/20/2012   Dyspnea 09/13/2012   Asthmatic bronchitis , chronic (HCC) 12/06/2011   Chronic cough 11/22/2011   Anxiety disorder 07/17/2009   HEMORRHOIDS-EXTERNAL 11/07/2008   IRRITABLE BOWEL SYNDROME 11/07/2008  Lung nodule 10/14/2008   Prediabetes 10/14/2008   PERSONAL HX COLONIC POLYPS 10/08/2008   Vitamin D deficiency 07/23/2008   SYNCOPE, HX OF 06/01/2008   FOOT SURGERY, HX OF 06/01/2008   DILATION AND CURETTAGE, HX OF 06/01/2008   OSTEOARTHRITIS, SHOULDER, RIGHT 03/22/2008   ROTATOR CUFF SYNDROME, RIGHT 03/22/2008   ARTHRALGIA 12/12/2007   Allergic rhinitis 08/01/2007   Current smoker 10/17/2006   Peripheral neuropathy 10/17/2006   FOOT PAIN, BILATERAL 10/17/2006    HYPOGLYCEMIA 09/30/2006   HYPERCHOLESTEROLEMIA 09/30/2006   MULTIPLE SCLEROSIS 09/30/2006   GERD 09/30/2006   FIBROCYSTIC BREAST DISEASE 09/30/2006    Past Surgical History:  Procedure Laterality Date   CERVICAL DISCECTOMY  2004   x 2, Anterior; Fusion C4-5, C5-6, C6-7, Dr. Kary Kos   CHEST CT  11/2008   With small 4 mm nodule L lung base (rec re check in 1 year)   CHEST CT  07/2009   Re check chest CT - lung nodule stable   CHOLECYSTECTOMY     COLONOSCOPY  08/2008   Polyps/ re check 5 yrs   CT SINUS LTD W/O CM  02/2009   negative   DILATION AND CURETTAGE OF UTERUS     ESOPHAGOGASTRODUODENOSCOPY  08/2008   Erosive gastritis, h pylori (treated)   FOOT SURGERY     left plantar fascial problem   ROTATOR CUFF REPAIR     right   TONSILLECTOMY     TUBAL LIGATION     UPPER GASTROINTESTINAL ENDOSCOPY      OB History   No obstetric history on file.      Home Medications    Prior to Admission medications   Medication Sig Start Date End Date Taking? Authorizing Provider  albuterol (VENTOLIN HFA) 108 (90 Base) MCG/ACT inhaler INHALE 2 PUFFS UP TO EVERY 4 HOURS AS NEEDED FOR WHEEZING 06/10/21   [name], Wynelle Fanny, MD  ALPRAZolam Duanne Moron) 0.5 MG tablet Take 1 tablet (0.5 mg total) by mouth 2 (two) times daily as needed. For anxiety. 02/11/15   [name], Wynelle Fanny, MD  aspirin EC 81 MG tablet Take 1 tablet (81 mg total) by mouth daily. Swallow whole. 04/17/21   Minna Merritts, MD  Blood Glucose Monitoring Suppl (ACCU-CHEK AVIVA PLUS) w/Device KIT Check blood sugar once daily and as directed Dx R73.09 08/19/15   [name], Wynelle Fanny, MD  budesonide-formoterol [facility]) 160-4.5 MCG/ACT inhaler Inhale 2 puffs into the lungs 2 (two) times daily. 01/08/21   Chesley Mires, MD  cholecalciferol (VITAMIN D3) 25 MCG (1000 UNIT) tablet Take 1,000 Units by mouth daily.    [provider]  Diclofenac Sodium 3 % GEL Place 1 application onto the skin every 12 (twelve) hours as needed. 06/18/16   Orbie Pyo, MD  fluticasone (FLONASE) 50 MCG/ACT nasal spray Place 1 spray into both nostrils daily. 01/08/21   Chesley Mires, MD  gabapentin (NEURONTIN) 300 MG capsule TAKE 1 CAPSULE BY MOUTH TWICE DURING THE DAY AND 3 CAPSULES BY MOUTH AT NIGHT Patient taking differently: Take 600 mg by mouth at bedtime. TAKE 1 CAPSULE BY MOUTH TWICE DURING THE DAY AND 3 CAPSULES BY MOUTH AT NIGHT 06/01/21   Sarina Ill B, MD  ipratropium-albuterol (DUONEB) 0.5-2.5 (3) MG/3ML SOLN TAKE 3 MLS BY NEBULIZATION EVERY 6 (SIX) HOURS AS NEEDED. 09/05/18   Laverle Hobby, MD  Multiple Vitamin (MULTIVITAMIN) tablet Take 1 tablet by mouth daily.    [provider]  pantoprazole (PROTONIX) 40 MG tablet Take 40 mg by mouth  daily as needed. 09/05/19   [provider]  predniSONE (DELTASONE) 10 MG tablet Take 4 pills once daily by mouth for 3 days, then 3 pills daily for 3 days, then 2 pills daily for 3 days then 1 pill daily for 3 days then stop 08/14/21   [name], Wynelle Fanny, MD  rosuvastatin (CRESTOR) 20 MG tablet Take 1 tablet (20 mg total) by mouth daily. 06/11/21   [name], Wynelle Fanny, MD  Spacer/Aero-Holding Chambers (AEROCHAMBER MV) inhaler Use as instructed 01/08/21   Chesley Mires, MD    Family History Family History  Problem Relation Age of Onset   Migraines Mother    Heart attack Mother    Coronary artery disease Mother    Coronary artery disease Father        6 bypasses    Stroke Father    Diabetes Father    Renal Disease Father    Colon cancer Maternal Grandfather 60   Coronary artery disease Cousin    Coronary artery disease Paternal Grandmother    Lung cancer Paternal Aunt    Multiple sclerosis Neg Hx    Esophageal cancer Neg Hx    Rectal cancer Neg Hx    Stomach cancer Neg Hx    Breast cancer Neg Hx     Social History Social History   Tobacco Use   Smoking status: Former    Packs/day: 2.00    Years: 30.00    Total pack years: 60.00    Types: Cigarettes    Quit date: 06/17/2019     Years since quitting: 2.1   Smokeless tobacco: Never   Tobacco comments:    0.5ppd - 03/25/2021  Vaping Use   Vaping Use: Never used  Substance Use Topics   Alcohol use: No    Alcohol/week: 0.0 standard drinks of alcohol   Drug use: No     Allergies   Percocet [oxycodone-acetaminophen], Tecfidera [dimethyl fumarate], Amitriptyline hcl, Atorvastatin, Betaseron [interferon beta-1b], Clarithromycin, Duloxetine, Pregabalin, Pseudoephedrine, Zoloft [sertraline hcl], and Levofloxacin   Review of Systems Review of Systems  Constitutional:  Negative for chills and fever.  Respiratory:  Negative for cough and shortness of breath.   Cardiovascular:  Negative for chest pain and palpitations.  Gastrointestinal:  Negative for abdominal pain, diarrhea and vomiting.  Musculoskeletal:  Positive for arthralgias and back pain.  Skin:  Negative for color change, rash and wound.  Neurological:  Positive for headaches. Negative for dizziness, syncope, facial asymmetry, speech difficulty, weakness, light-headedness and numbness.  All other systems reviewed and are negative.    Physical Exam Triage Vital Signs ED Triage Vitals  Enc Vitals Group     BP      Pulse      Resp      Temp      Temp src      SpO2      Weight      Height      Head Circumference      Peak Flow      Pain Score      Pain Loc      Pain Edu?      Excl. in [place]?    No data found.  Updated Vital Signs BP 136/67 (BP Location: Left Arm)   Pulse 89   Temp 98.7 F (37.1 C) (Oral)   Resp 18   SpO2 93%   Visual Acuity Right Eye Distance:   Left Eye Distance:   Bilateral Distance:    Right Eye Near:  Left Eye Near:    Bilateral Near:     Physical Exam Vitals and nursing note reviewed.  Constitutional:      General: She is not in acute distress.    Appearance: Normal appearance. She is well-developed. She is not ill-appearing.  HENT:     Mouth/Throat:     Mouth: Mucous membranes are moist.   Cardiovascular:     Rate and Rhythm: Normal rate and regular rhythm.     Heart sounds: Normal heart sounds.  Pulmonary:     Effort: Pulmonary effort is normal. No respiratory distress.     Breath sounds: Normal breath sounds.  Musculoskeletal:        General: No swelling, tenderness, deformity or signs of injury. Normal range of motion.     Cervical back: Neck supple.  Skin:    General: Skin is warm and dry.     Capillary Refill: Capillary refill takes less than 2 seconds.     Findings: No bruising, erythema, lesion or rash.  Neurological:     General: No focal deficit present.     Mental Status: She is alert and oriented to person, place, and time.     Sensory: No sensory deficit.     Motor: No weakness.     Gait: Gait normal.  Psychiatric:        Mood and Affect: Mood normal.        Behavior: Behavior normal.      UC Treatments / Results  Labs (all labs ordered are listed, but only abnormal results are displayed) Labs Reviewed - No data to display  EKG   Radiology No results found.  Procedures Procedures (including critical care time)  Medications Ordered in UC Medications - No data to display  Initial Impression / Assessment and Plan / UC Course  I have reviewed the triage vital signs and the nursing notes.  Pertinent labs & imaging results that were available during my care of the patient were reviewed by me and considered in my medical decision making (see chart for details).  Headache.  Patient is well-appearing and her exam is reassuring.  She is afebrile and vital signs are stable.  No indication of infection at this time.  Patient is concerned that she had similar headache last year and had pneumonia.  She does not have fever or unusual cough.  No respiratory distress.  No indication of infection currently.  Instructed patient to follow-up with her PCP on Monday.  ED precautions discussed.  Discussed Tylenol or ibuprofen as needed for headache.  Education  provided on generalized headache.  Patient agrees to plan of care.   Final Clinical Impressions(s) / UC Diagnoses   Final diagnoses:  Acute nonintractable headache, unspecified headache type     Discharge Instructions      Take Tylenol or ibuprofen as needed for discomfort.  Follow-up with your primary care provider on Monday.  Go to the emergency department if you have worsening symptoms.       ED Prescriptions   None    PDMP not reviewed this encounter.   Sharion Balloon, NP 08/21/21 [phone]

## 2021-08-21 NOTE — Telephone Encounter (Signed)
Spoke to patient by telephone and was advised she is having head pain that started at the back of her neck and has moved, to the crown of her head. Patient stated that the area where the pain is is hot to the touch. Patient stated that she had similar symptoms about a year ago and was given a real strong antibiotic which took care of the problem. Called patient back after talking with Romilda Garret NP. Patient was advised that she needs to be evaluated today with there symptoms. Patient was advised that we do not have appointments available today at the office. Patient stated that she is on Amoxicillin because she is having teeth pulled next week. Patient stated that she is at [facility] now and will head to the [facility] on [address].

## 2021-08-21 NOTE — ED Triage Notes (Addendum)
Pt states for the past 3 weeks she has had a HA at the back of her head that radiates up to the front. She saw her PCP on 6/9 and prescribed Tramadol and prednisone.

## 2021-08-21 NOTE — Telephone Encounter (Signed)
noted 

## 2021-08-25 ENCOUNTER — Telehealth: Payer: Self-pay

## 2021-08-25 NOTE — Telephone Encounter (Signed)
Called and lvm for patient to call us back. 

## 2021-08-25 NOTE — Telephone Encounter (Signed)
-----   Message from Abner Greenspan, MD sent at 08/24/2021  5:54 PM EDT ----- Myrtie Cruise, this is up to her.  If it worsens I certainly recommend checking back in with them. ----- Message ----- From: Francella Solian, CMA Sent: 08/21/2021   9:27 AM EDT To: Abner Greenspan, MD  Called patient states that she has seen neurology about this in the past. The only thing that they wanted to do then was increase her gabapentin and did not want to do anythig further. She has taken the prednisone.

## 2021-08-27 ENCOUNTER — Encounter: Payer: Self-pay | Admitting: Pulmonary Disease

## 2021-08-27 ENCOUNTER — Ambulatory Visit: Payer: Medicare HMO | Admitting: Pulmonary Disease

## 2021-08-27 VITALS — BP 134/80 | HR 85 | Temp 98.0°F | Ht 60.0 in | Wt 182.0 lb

## 2021-08-27 DIAGNOSIS — J449 Chronic obstructive pulmonary disease, unspecified: Secondary | ICD-10-CM

## 2021-08-27 NOTE — Patient Instructions (Signed)
Follow up in 1 year.

## 2021-09-28 ENCOUNTER — Ambulatory Visit (INDEPENDENT_AMBULATORY_CARE_PROVIDER_SITE_OTHER): Payer: Medicare HMO | Admitting: Family Medicine

## 2021-09-28 ENCOUNTER — Encounter: Payer: Self-pay | Admitting: Family Medicine

## 2021-09-28 VITALS — BP 126/74 | HR 74 | Temp 97.6°F | Ht 60.0 in | Wt 186.4 lb

## 2021-09-28 DIAGNOSIS — G35 Multiple sclerosis: Secondary | ICD-10-CM | POA: Diagnosis not present

## 2021-09-28 DIAGNOSIS — R519 Headache, unspecified: Secondary | ICD-10-CM | POA: Diagnosis not present

## 2021-09-28 DIAGNOSIS — R200 Anesthesia of skin: Secondary | ICD-10-CM | POA: Diagnosis not present

## 2021-09-28 LAB — BASIC METABOLIC PANEL
BUN: 15 mg/dL (ref 6–23)
CO2: 31 mEq/L (ref 19–32)
Calcium: 9.9 mg/dL (ref 8.4–10.5)
Chloride: 99 mEq/L (ref 96–112)
Creatinine, Ser: 1.04 mg/dL (ref 0.40–1.20)
GFR: 57.13 mL/min — ABNORMAL LOW (ref >60.00)
Glucose, Bld: 80 mg/dL (ref 70–99)
Potassium: 4.4 mEq/L (ref 3.5–5.1)
Sodium: 139 mEq/L (ref 135–145)

## 2021-09-28 MED ORDER — AMOXICILLIN-POT CLAVULANATE 875-125 MG PO TABS: 1.0000 | ORAL_TABLET | Freq: Two times a day (BID) | ORAL | 0 refills | Status: DC

## 2021-09-28 NOTE — Progress Notes (Signed)
Karina [name] T. Karina Riendeau, MD, [place] at [facility] [employer] [place] [place], [address]  Phone: [phone]  FAX: [phone]  Karina [name] - 66 y.o. female  MRN [id number]  Date of Birth: 03-08-1956  Date: 09/28/2021  PCP: Abner Greenspan, MD  Referral: Abner Greenspan, MD  Chief Complaint  Patient presents with   Headache    Back of head-started in May-Had same thing last year and was seen at the ER   Subjective:   Karina [name] is a 66 y.o. very pleasant female patient with Body mass index is 36.4 kg/m. who presents with the following:  66 year old with a history of MS for 20 years who presents with 6 weeks of severe headache as well as some questions about memory changes, and she also is having some numbness on the right arm as well as the right leg, all been present on the order of weeks.  She went to the ER on August 21, 2021 for headache.  She had a lot of pain in the back of her head, it is a deep dull ache to the point where she is scratching it and developed some excoriations and breakout on the back of her head. She also is having a lot of trouble with names, she is having trouble remembering, and she is having difficulty remembering the name of objects or specific proper nouns, when she thinks that she could.  She has been very tired in general. -She has been followed by neurology for many years for multiple sclerosis, first by Dr. Erling Cruz, and then by Dr. Jannifer Franklin.  She also has a family history of dementia and a sister as well as her father.  Family history of stroke.  She has had no specific trauma or injury that she can recall.  She does not have any focal weakness that she can recall.  She does have some intermittent neck and back pain, as well. No slurred speech.  No visual disturbance.  B arm tingling - unsure timing Numb r all fingertips - only on the R  R side numbness leg  Review of Systems is noted in the  HPI, as appropriate  Patient Active Problem List   Diagnosis Date Noted   Left shoulder pain 08/14/2021   Occult blood positive stool 05/25/2021   Colon cancer screening 05/07/2021   Estrogen deficiency 05/07/2021   Acute bronchitis with COPD ([place]) 03/31/2021   Rheumatoid arthritis ([place]) 03/31/2021   Coronary artery calcification 03/31/2021   Hypokalemia 08/27/2020   Joint pain in both hands 10/12/2019   Head pain 08/22/2019   Elevated serum creatinine 04/24/2019   Frequent urination 04/20/2019   Decreased appetite 04/20/2019   Encounter for screening mammogram for breast cancer 04/20/2019   Seborrheic keratosis, inflamed 12/15/2017   B12 deficiency 08/24/2017   Left low back pain 08/24/2017   Erosive osteoarthritis of both hands 09/09/2016   Fatigue 09/07/2016   Bilateral hand pain 09/07/2016   Routine general medical examination at a health care facility 05/06/2015   Large breasts 05/06/2015   Lumbar disc disease 11/01/2014   Knee pain, bilateral 03/11/2014   Thoracic back pain 11/30/2013   Encounter for routine gynecological examination 01/31/2013   Encounter for Medicare annual wellness exam 01/23/2013   Chest pain 09/20/2012   Dyspnea 09/13/2012   Asthmatic bronchitis , chronic (HCC) 12/06/2011   Chronic cough 11/22/2011   Anxiety disorder 07/17/2009   HEMORRHOIDS-EXTERNAL 11/07/2008   IRRITABLE BOWEL  SYNDROME 11/07/2008   Lung nodule 10/14/2008   Prediabetes 10/14/2008   PERSONAL HX COLONIC POLYPS 10/08/2008   Vitamin D deficiency 07/23/2008   SYNCOPE, HX OF 06/01/2008   FOOT SURGERY, HX OF 06/01/2008   DILATION AND CURETTAGE, HX OF 06/01/2008   OSTEOARTHRITIS, SHOULDER, RIGHT 03/22/2008   ROTATOR CUFF SYNDROME, RIGHT 03/22/2008   ARTHRALGIA 12/12/2007   Allergic rhinitis 08/01/2007   Current smoker 10/17/2006   Peripheral neuropathy 10/17/2006   FOOT PAIN, BILATERAL 10/17/2006   HYPOGLYCEMIA 09/30/2006   HYPERCHOLESTEROLEMIA 09/30/2006   MULTIPLE SCLEROSIS  09/30/2006   GERD 09/30/2006   FIBROCYSTIC BREAST DISEASE 09/30/2006    Past Medical History:  Diagnosis Date   Allergic rhinitis    Allergy    Anxiety    [place]- Evalina Field, no per pt   Cataract    Colon polyps 08/2008   Adenomatous   COPD (chronic obstructive pulmonary disease) (HCC)    Depression    no per pt   Diabetes mellitus    Gastritis 08/2008   H. pylori   GERD (gastroesophageal reflux disease)    Hypercholesteremia    Lung nodule    Menopausal disorder    Multiple sclerosis (HCC)    Neuromuscular disorder (HCC)    Plantar fasciitis    Seizures (HCC)    1 seizure in 2001 due to blood sugar dropping to 38, none since   Shingles    Left V1   Urinary incontinence    Vertigo     Past Surgical History:  Procedure Laterality Date   CERVICAL DISCECTOMY  2004   x 2, Anterior; Fusion C4-5, C5-6, C6-7, Dr. Donalee Citrin   CHEST CT  11/2008   With small 4 mm nodule L lung base (rec re check in 1 year)   CHEST CT  07/2009   Re check chest CT - lung nodule stable   CHOLECYSTECTOMY     COLONOSCOPY  08/2008   Polyps/ re check 5 yrs   CT SINUS LTD W/O CM  02/2009   negative   DILATION AND CURETTAGE OF UTERUS     ESOPHAGOGASTRODUODENOSCOPY  08/2008   Erosive gastritis, h pylori (treated)   FOOT SURGERY     left plantar fascial problem   ROTATOR CUFF REPAIR     right   TONSILLECTOMY     TUBAL LIGATION     UPPER GASTROINTESTINAL ENDOSCOPY      Family History  Problem Relation Age of Onset   Migraines Mother    Heart attack Mother    Coronary artery disease Mother    Coronary artery disease Father        6 bypasses    Stroke Father    Diabetes Father    Renal Disease Father    Colon cancer Maternal Grandfather 53   Coronary artery disease Cousin    Coronary artery disease Paternal Grandmother    Lung cancer Paternal Aunt    Multiple sclerosis Neg Hx    Esophageal cancer Neg Hx    Rectal cancer Neg Hx    Stomach cancer Neg Hx    Breast cancer Neg Hx      Social History   Social History Narrative   Married with 3 children   Daily caffeine use - 2    Disabled.   Education 9th grade    Caffeine one cup of coffee daily.   Patient is right handed.      Objective:   BP 126/74   Pulse 74  Temp 97.6 F (36.4 C) (Oral)   Ht 5' (1.524 m)   Wt 186 lb 6 oz (84.5 kg)   SpO2 96%   BMI 36.40 kg/m   GEN: No acute distress; alert,appropriate. PULM: Breathing comfortably in no respiratory distress PSYCH: Normally interactive.    Neuro: CN 2-12 grossly intact. PERRLA. EOMI.  Decreased sensation to soft and pinprick touch on the right side, more in a radial distribution, however she is have some numbness on the fingertips of all fingers on the right side. Right side is normal.  Right lower extremity is also fairly globally with decreased sensation, without any focal did change in strength or focal location of specific abnormal sensation.  Strength is intact throughout.  Str 5/5 all extremities. DTR 2+. No clonus. A and o x 4. Romberg neg. Finger nose neg.   PSYCH: Normally interactive. Conversant. Not depressed or anxious appearing.  Calm demeanor.    Laboratory and Imaging Data:  Assessment and Plan:     ICD-10-CM   1. Nonintractable headache, unspecified chronicity pattern, unspecified headache type  R51.9 MR Brain W Wo Contrast    Basic metabolic panel    2. Right arm numbness  R20.0 MR Brain W Wo Contrast    Basic metabolic panel    3. Right leg numbness  R20.0 MR Brain W Wo Contrast    Basic metabolic panel    4. MULTIPLE SCLEROSIS  G35 MR Brain W Wo Contrast    Basic metabolic panel     Total encounter time: 40 minutes. This includes total time spent on the day of encounter.  Additional time spent on chart review, review of prior office notes, ER notes, neurology notes.  Relatively new onset right-sided arm numbness, right-sided leg numbness in a patient with multiple sclerosis for 20 years.  Obtain an MRI of the  brain with and without contrast to assess for advancing multiple sclerosis, rule out potential neoplasm or stroke.  She also has some 6 weeks of headache, and this may all be associated with the above.  She also has a history of sinus disease, and I am getting give the patient a round of some Augmentin.  Hopefully this will help with a headache.  Additional follow-up will be indicated based on the patient's brain MRI.  Medication Management during today's office visit: Meds ordered this encounter  Medications   amoxicillin-clavulanate (AUGMENTIN) 875-125 MG tablet    Sig: Take 1 tablet by mouth 2 (two) times daily.    Dispense:  20 tablet    Refill:  0   Medications Discontinued During This Encounter  Medication Reason   amoxicillin (AMOXIL) 500 MG capsule Completed Course   predniSONE (DELTASONE) 10 MG tablet Completed Course   gabapentin (NEURONTIN) 332 MG capsule Duplicate   Diclofenac Sodium 3 % GEL Completed Course   ibuprofen (ADVIL) 800 MG tablet Completed Course    Orders placed today for conditions managed today: Orders Placed This Encounter  Procedures   MR Brain W Wo Contrast   Basic metabolic panel    Follow-up if needed: No follow-ups on file.  [employer] One speech-to-text software was used for transcription in this dictation.  Possible transcriptional errors can occur using Editor, commissioning.   Signed,  Maud Deed. Michaelina Blandino, MD   Outpatient Encounter Medications as of 09/28/2021  Medication Sig   albuterol (VENTOLIN HFA) 108 (90 Base) MCG/ACT inhaler INHALE 2 PUFFS UP TO EVERY 4 HOURS AS NEEDED FOR WHEEZING   ALPRAZolam (XANAX) 0.5 MG  tablet Take 1 tablet (0.5 mg total) by mouth 2 (two) times daily as needed. For anxiety.   amoxicillin-clavulanate (AUGMENTIN) 875-125 MG tablet Take 1 tablet by mouth 2 (two) times daily.   aspirin EC 81 MG tablet Take 1 tablet (81 mg total) by mouth daily. Swallow whole.   Blood Glucose Monitoring Suppl (ACCU-CHEK AVIVA PLUS)  w/Device KIT Check blood sugar once daily and as directed Dx R73.09   budesonide-formoterol (SYMBICORT) 160-4.5 MCG/ACT inhaler Inhale 2 puffs into the lungs 2 (two) times daily.   cholecalciferol (VITAMIN D3) 25 MCG (1000 UNIT) tablet Take 1,000 Units by mouth daily.   fluticasone (FLONASE) 50 MCG/ACT nasal spray Place 1 spray into both nostrils daily.   gabapentin (NEURONTIN) 300 MG capsule Take 600 mg by mouth at bedtime.   ipratropium-albuterol (DUONEB) 0.5-2.5 (3) MG/3ML SOLN TAKE 3 MLS BY NEBULIZATION EVERY 6 (SIX) HOURS AS NEEDED.   Multiple Vitamin (MULTIVITAMIN) tablet Take 1 tablet by mouth daily.   pantoprazole (PROTONIX) 40 MG tablet Take 40 mg by mouth daily as needed.   rosuvastatin (CRESTOR) 20 MG tablet Take 1 tablet (20 mg total) by mouth daily.   Spacer/Aero-Holding Chambers (AEROCHAMBER MV) inhaler Use as instructed   [DISCONTINUED] Diclofenac Sodium 3 % GEL Place 1 application onto the skin every 12 (twelve) hours as needed.   [DISCONTINUED] ibuprofen (ADVIL) 800 MG tablet Take 800 mg by mouth every 8 (eight) hours as needed.   [DISCONTINUED] amoxicillin (AMOXIL) 500 MG capsule Take 500 mg by mouth 3 (three) times daily.   [DISCONTINUED] gabapentin (NEURONTIN) 300 MG capsule TAKE 1 CAPSULE BY MOUTH TWICE DURING THE DAY AND 3 CAPSULES BY MOUTH AT NIGHT (Patient taking differently: Take 600 mg by mouth at bedtime. TAKE 1 CAPSULE BY MOUTH TWICE DURING THE DAY AND 3 CAPSULES BY MOUTH AT NIGHT)   [DISCONTINUED] predniSONE (DELTASONE) 10 MG tablet Take 4 pills once daily by mouth for 3 days, then 3 pills daily for 3 days, then 2 pills daily for 3 days then 1 pill daily for 3 days then stop   No facility-administered encounter medications on file as of 09/28/2021.

## 2021-09-29 ENCOUNTER — Ambulatory Visit (INDEPENDENT_AMBULATORY_CARE_PROVIDER_SITE_OTHER): Payer: Medicare HMO

## 2021-09-29 VITALS — Ht 60.0 in | Wt 186.0 lb

## 2021-09-29 DIAGNOSIS — Z0000 Encounter for general adult medical examination without abnormal findings: Secondary | ICD-10-CM | POA: Diagnosis not present

## 2021-09-29 NOTE — Progress Notes (Signed)
Virtual Visit via Telephone Note  I connected with  Karina [name] on 09/29/21 at  1:15 PM EDT by telephone and verified that I am speaking with the correct person using two identifiers.  Location: Patient: home Provider: [facility] Persons participating in the virtual visit: [place]   I discussed the limitations, risks, security and privacy concerns of performing an evaluation and management service by telephone and the availability of in person appointments. The patient expressed understanding and agreed to proceed.  Interactive audio and video telecommunications were attempted between this nurse and patient, however failed, due to patient having technical difficulties OR patient did not have access to video capability.  We continued and completed visit with audio only.  Some vital signs may be absent or patient reported.   Dionisio David, LPN  Subjective:   Karina [name] is a 66 y.o. female who presents for Medicare Annual (Subsequent) preventive examination.  Review of Systems           Objective:    There were no vitals filed for this visit. There is no height or weight on file to calculate BMI.     08/18/2020    8:31 AM 10/17/2019   12:31 PM 08/22/2019    1:51 PM 06/03/2017   11:05 AM 03/31/2017    1:07 AM 09/01/2016   11:59 AM 06/18/2016   10:04 AM  Advanced Directives  Does Patient Have a Medical Advance Directive? No No No Yes No No No  Type of Advance Directive    Living will     Does patient want to make changes to medical advance directive?  No - Patient declined  No - Patient declined     Would patient like information on creating a medical advance directive? No - Patient declined    No - Patient declined  No - Patient declined    Current Medications (verified) Outpatient Encounter Medications as of 09/29/2021  Medication Sig   albuterol (VENTOLIN HFA) 108 (90 Base) MCG/ACT inhaler INHALE 2 PUFFS UP TO EVERY 4 HOURS AS NEEDED FOR  WHEEZING   ALPRAZolam (XANAX) 0.5 MG tablet Take 1 tablet (0.5 mg total) by mouth 2 (two) times daily as needed. For anxiety.   amoxicillin-clavulanate (AUGMENTIN) 875-125 MG tablet Take 1 tablet by mouth 2 (two) times daily.   aspirin EC 81 MG tablet Take 1 tablet (81 mg total) by mouth daily. Swallow whole.   Blood Glucose Monitoring Suppl (ACCU-CHEK AVIVA PLUS) w/Device KIT Check blood sugar once daily and as directed Dx R73.09   budesonide-formoterol (SYMBICORT) 160-4.5 MCG/ACT inhaler Inhale 2 puffs into the lungs 2 (two) times daily.   chlorhexidine (PERIDEX) 0.12 % solution SMARTSIG:By Mouth   cholecalciferol (VITAMIN D3) 25 MCG (1000 UNIT) tablet Take 1,000 Units by mouth daily.   fluticasone (FLONASE) 50 MCG/ACT nasal spray Place 1 spray into both nostrils daily.   gabapentin (NEURONTIN) 300 MG capsule Take 600 mg by mouth at bedtime.   ipratropium-albuterol (DUONEB) 0.5-2.5 (3) MG/3ML SOLN TAKE 3 MLS BY NEBULIZATION EVERY 6 (SIX) HOURS AS NEEDED.   Multiple Vitamin (MULTIVITAMIN) tablet Take 1 tablet by mouth daily.   pantoprazole (PROTONIX) 40 MG tablet Take 40 mg by mouth daily as needed.   rosuvastatin (CRESTOR) 20 MG tablet Take 1 tablet (20 mg total) by mouth daily.   Spacer/Aero-Holding Chambers (AEROCHAMBER MV) inhaler Use as instructed   No facility-administered encounter medications on file as of 09/29/2021.    Allergies (verified) Percocet [oxycodone-acetaminophen],  Tecfidera [dimethyl fumarate], Amitriptyline hcl, Atorvastatin, Betaseron [interferon beta-1b], Clarithromycin, Duloxetine, Pregabalin, Pseudoephedrine, Zoloft [sertraline hcl], and Levofloxacin   History: Past Medical History:  Diagnosis Date   Allergic rhinitis    Allergy    Anxiety    [place], no per pt   Cataract    Colon polyps 08/2008   Adenomatous   COPD (chronic obstructive pulmonary disease) ([place])    Depression    no per pt   Diabetes mellitus    Gastritis 08/2008   H. pylori    GERD (gastroesophageal reflux disease)    Hypercholesteremia    Lung nodule    Menopausal disorder    Multiple sclerosis ([place])    Neuromuscular disorder ([place])    Plantar fasciitis    Seizures ([place])    1 seizure in 2001 due to blood sugar dropping to 38, none since   Shingles    Left V1   Urinary incontinence    Vertigo    Past Surgical History:  Procedure Laterality Date   CERVICAL DISCECTOMY  2004   x 2, Anterior; Fusion C4-5, C5-6, C6-7, Dr. Kary Kos   CHEST CT  11/2008   With small 4 mm nodule L lung base (rec re check in 1 year)   CHEST CT  07/2009   Re check chest CT - lung nodule stable   CHOLECYSTECTOMY     COLONOSCOPY  08/2008   Polyps/ re check 5 yrs   CT SINUS LTD W/O CM  02/2009   negative   DILATION AND CURETTAGE OF UTERUS     ESOPHAGOGASTRODUODENOSCOPY  08/2008   Erosive gastritis, h pylori (treated)   FOOT SURGERY     left plantar fascial problem   ROTATOR CUFF REPAIR     right   TONSILLECTOMY     TUBAL LIGATION     UPPER GASTROINTESTINAL ENDOSCOPY     Family History  Problem Relation Age of Onset   Migraines Mother    Heart attack Mother    Coronary artery disease Mother    Coronary artery disease Father        6 bypasses    Stroke Father    Diabetes Father    Renal Disease Father    Colon cancer Maternal Grandfather 40   Coronary artery disease Cousin    Coronary artery disease Paternal Grandmother    Lung cancer Paternal Aunt    Multiple sclerosis Neg Hx    Esophageal cancer Neg Hx    Rectal cancer Neg Hx    Stomach cancer Neg Hx    Breast cancer Neg Hx    Social History   Socioeconomic History   Marital status: Married    Spouse name: Not on file   Number of children: 3   Years of education: 9 th   Highest education level: Not on file  Occupational History   Occupation: Disabled     Employer: DISABLED  Tobacco Use   Smoking status: Former    Packs/day: 2.00    Years: 30.00    Total pack years: 60.00    Types: Cigarettes     Quit date: 06/17/2019    Years since quitting: 2.2   Smokeless tobacco: Never  Vaping Use   Vaping Use: Never used  Substance and Sexual Activity   Alcohol use: No    Alcohol/week: 0.0 standard drinks of alcohol   Drug use: No   Sexual activity: Not on file  Other Topics Concern   Not on file  Social History Narrative   Married with 3 children   Daily caffeine use - 2    Disabled.   Education 9th grade    Caffeine one cup of coffee daily.   Patient is right handed.    Social Determinants of Health   Financial Resource Strain: Low Risk  (06/10/2021)   Overall Financial Resource Strain (CARDIA)    Difficulty of Paying Living Expenses: Not very hard  Food Insecurity: No Food Insecurity (06/10/2021)   Hunger Vital Sign    Worried About Running Out of Food in the Last Year: Never true    Ran Out of Food in the Last Year: Never true  Transportation Needs: No Transportation Needs (10/17/2019)   PRAPARE - Hydrologist (Medical): No    Lack of Transportation (Non-Medical): No  Physical Activity: Inactive (09/29/2021)   Exercise Vital Sign    Days of Exercise per Week: 0 days    Minutes of Exercise per Session: 0 min  Stress: No Stress Concern Present (09/29/2021)   [place]    Feeling of Stress : Only a little  Social Connections: Not on file    Tobacco Counseling Counseling given: Not Answered   Clinical Intake:  Pre-visit preparation completed: No  Pain : No/denies pain     Nutritional Risks: None Diabetes: No  How often do you need to have someone help you when you read instructions, pamphlets, or other written materials from your doctor or pharmacy?: 1 - Never  Diabetic?no  Interpreter Needed?: No  Information entered by :: Kirke Shaggy, LPN   Activities of Daily Living     No data to display          Patient Care Team: [name], Wynelle Fanny, MD as PCP -  General Kathrynn Ducking, MD (Inactive) as Consulting Physician (Neurology) Minna Merritts, MD as Consulting Physician (Cardiology) Rutherford Guys, MD as Consulting Physician (Ophthalmology) Charlton Haws, [facility] as Pharmacist (Pharmacist)  Indicate any recent Medical Services you may have received from other than [facility] providers in the past year (date may be approximate).     Assessment:   This is a routine wellness examination for Tasnim.  Hearing/Vision screen No results found.  Dietary issues and exercise activities discussed:     Goals Addressed             This Visit's Progress    DIET - EAT MORE FRUITS AND VEGETABLES         Depression Screen    09/29/2021    1:03 PM 08/14/2021   11:36 AM 05/07/2021    8:09 AM 10/17/2019   12:32 PM 09/01/2016   11:50 AM 04/30/2015    1:35 PM 01/31/2013   10:46 AM  PHQ 2/9 Scores  PHQ - 2 Score 0 0 0 0 0 0 2  PHQ- 9 Score 0   0       Fall Risk    08/14/2021   11:36 AM 05/07/2021    8:07 AM 10/17/2019   12:32 PM 09/21/2016   10:50 AM 09/01/2016   11:50 AM  Fall Risk   Falls in the past year? 0 0 1 No No  Comment   tripped and fell in doorway    Number falls in past yr:   1    Injury with Fall?   0    Risk for fall due to :   Medication side effect  Follow up Falls evaluation completed Falls evaluation completed Falls evaluation completed;Falls prevention discussed      FALL RISK PREVENTION PERTAINING TO THE HOME:  Any stairs in or around the home? No  If so, are there any without handrails? No  Home free of loose throw rugs in walkways, pet beds, electrical cords, etc? Yes  Adequate lighting in your home to reduce risk of falls? Yes   ASSISTIVE DEVICES UTILIZED TO PREVENT FALLS:  Life alert? No  Use of a cane, walker or w/c? No  Grab bars in the bathroom? Yes  Shower chair or bench in shower? Yes  Elevated toilet seat or a handicapped toilet? No    Cognitive Function: declined d/t headache    10/17/2019    12:33 PM 09/01/2016   11:51 AM 04/30/2015    1:45 PM  MMSE - Mini Mental State Exam  Orientation to time _0 Orientation to Place _1 Registration _2 Attention/ Calculation 5 0 5  Recall _3 Language- name 2 objects  0   Language- repeat _4 Language- follow 3 step command  3 3  Language- read & follow direction  0 1  Write a sentence  0   Copy design  0   Total score  20         Immunizations Immunization History  Administered Date(s) Administered   Influenza,inj,Quad PF,6+ Mos 01/31/2013, 03/11/2014, 12/15/2017   Pneumococcal Polysaccharide-23 07/29/2003   Td 03/08/2000   Tdap 01/31/2013    TDAP status: Up to date  Flu Vaccine status: Declined, Education has been provided regarding the importance of this vaccine but patient still declined. Advised may receive this vaccine at local pharmacy or Health Dept. Aware to provide a copy of the vaccination record if obtained from local pharmacy or Health Dept. Verbalized acceptance and understanding.  Pneumococcal vaccine status: Due, Education has been provided regarding the importance of this vaccine. Advised may receive this vaccine at local pharmacy or Health Dept. Aware to provide a copy of the vaccination record if obtained from local pharmacy or Health Dept. Verbalized acceptance and understanding.  Covid-19 vaccine status: Declined, Education has been provided regarding the importance of this vaccine but patient still declined. Advised may receive this vaccine at local pharmacy or Health Dept.or vaccine clinic. Aware to provide a copy of the vaccination record if obtained from local pharmacy or Health Dept. Verbalized acceptance and understanding.  Qualifies for Shingles Vaccine? Yes   Zostavax completed No   Shingrix Completed?: No.    Education has been provided regarding the importance of this vaccine. Patient has been advised to call insurance company to determine out of pocket expense if they have not yet  received this vaccine. Advised may also receive vaccine at local pharmacy or Health Dept. Verbalized acceptance and understanding.  Screening Tests Health Maintenance  Topic Date Due   Zoster Vaccines- Shingrix (1 of 2) Never done   PAP SMEAR-Modifier  05/05/2018   COVID-19 Vaccine (1) 09/26/2024 (Originally 07/20/1956)   INFLUENZA VACCINE  10/06/2021   MAMMOGRAM  07/03/2022   TETANUS/TDAP  02/01/2023   COLONOSCOPY (Pts 45-21yr Insurance coverage will need to be confirmed)  07/30/2026   Hepatitis C Screening  Completed   HIV Screening  Completed   HPV VACCINES  Aged Out    Health Maintenance  Health Maintenance Due  Topic Date Due   Zoster Vaccines- Shingrix (1 of 2) Never done  PAP SMEAR-Modifier  05/05/2018    Colorectal cancer screening: Type of screening: Colonoscopy. Completed 07/29/21. Repeat every 5 years  Mammogram status: Completed 06/29/21. Repeat every year   Lung Cancer Screening: (Low Dose CT Chest recommended if Age 45-80 years, 30 pack-year currently smoking OR have quit w/in 15years.) does not qualify.    Additional Screening:  Hepatitis C Screening: does qualify; Completed 04/30/15  Vision Screening: Recommended annual ophthalmology exams for early detection of glaucoma and other disorders of the eye. Is the patient up to date with their annual eye exam?  Yes  Who is the provider or what is the name of the office in which the patient attends annual eye exams? Dr.Shapiro If pt is not established with a provider, would they like to be referred to a provider to establish care? No .   Dental Screening: Recommended annual dental exams for proper oral hygiene  Community Resource Referral / Chronic Care Management: CRR required this visit?  No   CCM required this visit?  No      Plan:     I have personally reviewed and noted the following in the patient's chart:   Medical and social history Use of alcohol, tobacco or illicit drugs  Current medications  and supplements including opioid prescriptions.  Functional ability and status Nutritional status Physical activity Advanced directives List of other physicians Hospitalizations, surgeries, and ER visits in previous 12 months Vitals Screenings to include cognitive, depression, and falls Referrals and appointments  In addition, I have reviewed and discussed with patient certain preventive protocols, quality metrics, and best practice recommendations. A written personalized care plan for preventive services as well as general preventive health recommendations were provided to patient.     Dionisio David, LPN   [id number]   Nurse Notes: none

## 2021-09-29 NOTE — Patient Instructions (Signed)
Karina [name] , Thank you for taking time to come for your Medicare Wellness Visit. I appreciate your ongoing commitment to your health goals. Please review the following plan we discussed and let me know if I can assist you in the future.   Screening recommendations/referrals: Colonoscopy: 07/29/21 Mammogram: 07/02/21 Bone Density: too young Recommended yearly ophthalmology/optometry visit for glaucoma screening and checkup Recommended yearly dental visit for hygiene and checkup  Vaccinations: Influenza vaccine: 12/15/17 Pneumococcal vaccine: 07/29/03, needs 2nd shot Tdap vaccine: 01/31/13 Shingles vaccine: n/d  Covid-19: n/d  Advanced directives: no  Conditions/risks identified: none  Next appointment: Follow up in one year for your annual wellness visit. 10/01/22 @ 11 am by phone  Preventive Care 40-64 Years, Female Preventive care refers to lifestyle choices and visits with your health care provider that can promote health and wellness. What does preventive care include? A yearly physical exam. This is also called an annual well check. Dental exams once or twice a year. Routine eye exams. Ask your health care provider how often you should have your eyes checked. Personal lifestyle choices, including: Daily care of your teeth and gums. Regular physical activity. Eating a healthy diet. Avoiding tobacco and drug use. Limiting alcohol use. Practicing safe sex. Taking low-dose aspirin daily starting at age 42. Taking vitamin and mineral supplements as recommended by your health care provider. What happens during an annual well check? The services and screenings done by your health care provider during your annual well check will depend on your age, overall health, lifestyle risk factors, and family history of disease. Counseling  Your health care provider may ask you questions about your: Alcohol use. Tobacco use. Drug use. Emotional well-being. Home and relationship  well-being. Sexual activity. Eating habits. Work and work Statistician. Method of birth control. Menstrual cycle. Pregnancy history. Screening  You may have the following tests or measurements: Height, weight, and BMI. Blood pressure. Lipid and cholesterol levels. These may be checked every 5 years, or more frequently if you are over 47 years old. Skin check. Lung cancer screening. You may have this screening every year starting at age 2 if you have a 30-pack-year history of smoking and currently smoke or have quit within the past 15 years. Fecal occult blood test (FOBT) of the stool. You may have this test every year starting at age 29. Flexible sigmoidoscopy or colonoscopy. You may have a sigmoidoscopy every 5 years or a colonoscopy every 10 years starting at age 67. Hepatitis C blood test. Hepatitis B blood test. Sexually transmitted disease (STD) testing. Diabetes screening. This is done by checking your blood sugar (glucose) after you have not eaten for a while (fasting). You may have this done every 1-3 years. Mammogram. This may be done every 1-2 years. Talk to your health care provider about when you should start having regular mammograms. This may depend on whether you have a family history of breast cancer. BRCA-related cancer screening. This may be done if you have a family history of breast, ovarian, tubal, or peritoneal cancers. Pelvic exam and Pap test. This may be done every 3 years starting at age 74. Starting at age 78, this may be done every 5 years if you have a Pap test in combination with an HPV test. Bone density scan. This is done to screen for osteoporosis. You may have this scan if you are at high risk for osteoporosis. Discuss your test results, treatment options, and if necessary, the need for more tests with your health care  provider. Vaccines  Your health care provider may recommend certain vaccines, such as: Influenza vaccine. This is recommended every  year. Tetanus, diphtheria, and acellular pertussis (Tdap, Td) vaccine. You may need a Td booster every 10 years. Zoster vaccine. You may need this after age 64. Pneumococcal 13-valent conjugate (PCV13) vaccine. You may need this if you have certain conditions and were not previously vaccinated. Pneumococcal polysaccharide (PPSV23) vaccine. You may need one or two doses if you smoke cigarettes or if you have certain conditions. Talk to your health care provider about which screenings and vaccines you need and how often you need them. This information is not intended to replace advice given to you by your health care provider. Make sure you discuss any questions you have with your health care provider. Document Released: 03/21/2015 Document Revised: 11/12/2015 Document Reviewed: 12/24/2014 Elsevier Interactive Patient Education  2017 [place] Prevention in the Home Falls can cause injuries. They can happen to people of all ages. There are many things you can do to make your home safe and to help prevent falls. What can I do on the outside of my home? Regularly fix the edges of walkways and driveways and fix any cracks. Remove anything that might make you trip as you walk through a door, such as a raised step or threshold. Trim any bushes or trees on the path to your home. Use bright outdoor lighting. Clear any walking paths of anything that might make someone trip, such as rocks or tools. Regularly check to see if handrails are loose or broken. Make sure that both sides of any steps have handrails. Any raised decks and porches should have guardrails on the edges. Have any leaves, snow, or ice cleared regularly. Use sand or salt on walking paths during winter. Clean up any spills in your garage right away. This includes oil or grease spills. What can I do in the bathroom? Use night lights. Install grab bars by the toilet and in the tub and shower. Do not use towel bars as grab  bars. Use non-skid mats or decals in the tub or shower. If you need to sit down in the shower, use a plastic, non-slip stool. Keep the floor dry. Clean up any water that spills on the floor as soon as it happens. Remove soap buildup in the tub or shower regularly. Attach bath mats securely with double-sided non-slip rug tape. Do not have throw rugs and other things on the floor that can make you trip. What can I do in the bedroom? Use night lights. Make sure that you have a light by your bed that is easy to reach. Do not use any sheets or blankets that are too big for your bed. They should not hang down onto the floor. Have a firm chair that has side arms. You can use this for support while you get dressed. Do not have throw rugs and other things on the floor that can make you trip. What can I do in the kitchen? Clean up any spills right away. Avoid walking on wet floors. Keep items that you use a lot in easy-to-reach places. If you need to reach something above you, use a strong step stool that has a grab bar. Keep electrical cords out of the way. Do not use floor polish or wax that makes floors slippery. If you must use wax, use non-skid floor wax. Do not have throw rugs and other things on the floor that can make  you trip. What can I do with my stairs? Do not leave any items on the stairs. Make sure that there are handrails on both sides of the stairs and use them. Fix handrails that are broken or loose. Make sure that handrails are as long as the stairways. Check any carpeting to make sure that it is firmly attached to the stairs. Fix any carpet that is loose or worn. Avoid having throw rugs at the top or bottom of the stairs. If you do have throw rugs, attach them to the floor with carpet tape. Make sure that you have a light switch at the top of the stairs and the bottom of the stairs. If you do not have them, ask someone to add them for you. What else can I do to help prevent  falls? Wear shoes that: Do not have high heels. Have rubber bottoms. Are comfortable and fit you well. Are closed at the toe. Do not wear sandals. If you use a stepladder: Make sure that it is fully opened. Do not climb a closed stepladder. Make sure that both sides of the stepladder are locked into place. Ask someone to hold it for you, if possible. Clearly mark and make sure that you can see: Any grab bars or handrails. First and last steps. Where the edge of each step is. Use tools that help you move around (mobility aids) if they are needed. These include: Canes. Walkers. Scooters. Crutches. Turn on the lights when you go into a dark area. Replace any light bulbs as soon as they burn out. Set up your furniture so you have a clear path. Avoid moving your furniture around. If any of your floors are uneven, fix them. If there are any pets around you, be aware of where they are. Review your medicines with your doctor. Some medicines can make you feel dizzy. This can increase your chance of falling. Ask your doctor what other things that you can do to help prevent falls. This information is not intended to replace advice given to you by your health care provider. Make sure you discuss any questions you have with your health care provider. Document Released: 12/19/2008 Document Revised: 07/31/2015 Document Reviewed: 03/29/2014 Elsevier Interactive Patient Education  2017 [employer].

## 2021-10-07 ENCOUNTER — Ambulatory Visit: Payer: Medicare HMO | Admitting: Neurology

## 2021-10-07 ENCOUNTER — Encounter: Payer: Self-pay | Admitting: Neurology

## 2021-10-07 VITALS — BP 106/68 | HR 80 | Ht 60.0 in | Wt 189.5 lb

## 2021-10-07 DIAGNOSIS — R519 Headache, unspecified: Secondary | ICD-10-CM

## 2021-10-07 DIAGNOSIS — G8929 Other chronic pain: Secondary | ICD-10-CM | POA: Diagnosis not present

## 2021-10-07 DIAGNOSIS — G35 Multiple sclerosis: Secondary | ICD-10-CM | POA: Diagnosis not present

## 2021-10-07 NOTE — Patient Instructions (Addendum)
Let me know when MRI results, we can review it  Can consider gabapentin to help with head pain

## 2021-10-07 NOTE — Progress Notes (Signed)
Patient: Karina [name] Date of Birth: 03-Jun-1955  Reason for Visit: Follow up for MS History from: Patient Primary Neurologist: Dr. Julius Bowels. [place]   ASSESSMENT AND PLAN 66 y.o. year old female   69.  Multiple sclerosis, relapsing remitting 2.  Occipital head pain, neuralgia  -MRI of the brain with and without contrast scheduled next week (reported right sided numbness/weakness), ensure MS stability, off DMT since at least 2015, previously on Betaseron and Tecfidera side effects -Unclear etiology of head type pain, similar presentation last year, was treated for PNA antibiotic, resolved, currently on Augmentin  -Offered gabapentin to help with head pain, she deferred; has intolerance to Cymbalta, amitriptyline, Lyrica -Recommended refrain from scratching -Call me when MRI results, we can review -Return back in 6 months with Dr. Felecia Shelling to establish care  HISTORY OF PRESENT ILLNESS: Today 10/07/21 Karina [name] is here today for follow-up for MS. Has been off DMT since at least 2015. In June developed occipital head pain that feels external like someone is pulling her hair, bumps to head, few bumps to thighs, chest. Was given prednisone and antibiotics. Similar presentation last June, diagnosed with PNA, symptoms improved. Scheduled for MRI of the brain next week. Has numbness to right side arm and leg, only noticed when she went to PCP in June. At baseline has transient numbness that moves. No falls, getting around well. Vision is fine. Recently had small amount bowel leakage. Taking Augmentin for sores on her head/sinus disease, likely from touching and scratching it. Tremor to right hand with action, chronic. Has seen dermatology for sores, going on for 10 years or more.   HISTORY 10/07/2020 Dr. Jannifer Franklin: Ms. Hardiman is a 66 year old right-handed white female with history of multiple sclerosis.  The patient has not been on any disease modifying agents for at least 6 years.  The patient had  been on Betaseron in the past but had problems with subcutaneous nodules and had to stop, she was placed on Tecfidera but had significant GI upset on the medication.  The patient had MRI of the brain done in February 2022 that showed some minimal progression from prior study done in 2019.  The patient clinically has done quite well without any new numbness, weakness, or balance changes.  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  She reports urinary frequency but drinks a lot of water during the day.  She denies any new visual changes.  She recently had cataract surgery.  She was in the emergency room on 18 August 2020 with pneumonia.  She has been complaining of a headache as well but this improved with treatment of the pneumonia.  She returns to the office today for an evaluation.   REVIEW OF SYSTEMS: Out of a complete 14 system review of symptoms, the patient complains only of the following symptoms, and all other reviewed systems are negative.  See HPI  ALLERGIES: Allergies  Allergen Reactions   Percocet [Oxycodone-Acetaminophen] Shortness Of Breath    Felt like going to pass out and sweating   Tecfidera [Dimethyl Fumarate] Shortness Of Breath, Palpitations, Rash and Cough   Amitriptyline Hcl Itching and Swelling   Atorvastatin Other (See Comments)    elevated LFT's   Betaseron [Interferon Beta-1b] Other (See Comments)    Headache   Clarithromycin Other (See Comments)    reaction not known   Duloxetine Other (See Comments)    pain   Pregabalin Other (See Comments)    LE swelling   Pseudoephedrine Other (  See Comments)    legs hurt   Zoloft [Sertraline Hcl] Other (See Comments)    Headaches    Levofloxacin Rash    HOME MEDICATIONS: Outpatient Medications Prior to Visit  Medication Sig Dispense Refill   albuterol (VENTOLIN HFA) 108 (90 Base) MCG/ACT inhaler INHALE 2 PUFFS UP TO EVERY 4 HOURS AS NEEDED FOR WHEEZING 3 each 3   ALPRAZolam (XANAX) 0.5 MG tablet Take 1 tablet (0.5  mg total) by mouth 2 (two) times daily as needed. For anxiety. 30 tablet 0   amoxicillin-clavulanate (AUGMENTIN) 875-125 MG tablet Take 1 tablet by mouth 2 (two) times daily. 20 tablet 0   aspirin EC 81 MG tablet Take 1 tablet (81 mg total) by mouth daily. Swallow whole. 90 tablet 3   Blood Glucose Monitoring Suppl (ACCU-CHEK AVIVA PLUS) w/Device KIT Check blood sugar once daily and as directed Dx R73.09 1 kit 0   budesonide-formoterol (SYMBICORT) 160-4.5 MCG/ACT inhaler Inhale 2 puffs into the lungs 2 (two) times daily. 3 each 2   chlorhexidine (PERIDEX) 0.12 % solution SMARTSIG:By Mouth     cholecalciferol (VITAMIN D3) 25 MCG (1000 UNIT) tablet Take 1,000 Units by mouth daily.     fluticasone (FLONASE) 50 MCG/ACT nasal spray Place 1 spray into both nostrils daily. 16 g 2   gabapentin (NEURONTIN) 300 MG capsule Take 600 mg by mouth at bedtime.     ipratropium-albuterol (DUONEB) 0.5-2.5 (3) MG/3ML SOLN TAKE 3 MLS BY NEBULIZATION EVERY 6 (SIX) HOURS AS NEEDED. 360 mL 0   Multiple Vitamin (MULTIVITAMIN) tablet Take 1 tablet by mouth daily.     pantoprazole (PROTONIX) 40 MG tablet Take 40 mg by mouth daily as needed.     rosuvastatin (CRESTOR) 20 MG tablet Take 1 tablet (20 mg total) by mouth daily. 90 tablet 3   Spacer/Aero-Holding Chambers (AEROCHAMBER MV) inhaler Use as instructed 1 each 0   No facility-administered medications prior to visit.    PAST MEDICAL HISTORY: Past Medical History:  Diagnosis Date   Allergic rhinitis    Allergy    Anxiety    [place], no per pt   Cataract    Colon polyps 08/2008   Adenomatous   COPD (chronic obstructive pulmonary disease) ([place])    Depression    no per pt   Diabetes mellitus    Gastritis 08/2008   H. pylori   GERD (gastroesophageal reflux disease)    Hypercholesteremia    Lung nodule    Menopausal disorder    Multiple sclerosis (H[facility])    Neuromuscular disorder (HCC)    Plantar fasciitis    Seizures ([place])    1 seizure in  2001 due to blood sugar dropping to 38, none since   Shingles    Left V1   Urinary incontinence    Vertigo     PAST SURGICAL HISTORY: Past Surgical History:  Procedure Laterality Date   CERVICAL DISCECTOMY  2004   x 2, Anterior; Fusion C4-5, C5-6, C6-7, Dr. Kary Kos   CHEST CT  11/2008   With small 4 mm nodule L lung base (rec re check in 1 year)   CHEST CT  07/2009   Re check chest CT - lung nodule stable   CHOLECYSTECTOMY     COLONOSCOPY  08/2008   Polyps/ re check 5 yrs   CT SINUS LTD W/O CM  02/2009   negative   DILATION AND CURETTAGE OF UTERUS     ESOPHAGOGASTRODUODENOSCOPY  08/2008   Erosive gastritis, h  pylori (treated)   FOOT SURGERY     left plantar fascial problem   ROTATOR CUFF REPAIR     right   TONSILLECTOMY     TUBAL LIGATION     UPPER GASTROINTESTINAL ENDOSCOPY      FAMILY HISTORY: Family History  Problem Relation Age of Onset   Migraines Mother    Heart attack Mother    Coronary artery disease Mother    Coronary artery disease Father        6 bypasses    Stroke Father    Diabetes Father    Renal Disease Father    Colon cancer Maternal Grandfather 88   Coronary artery disease Cousin    Coronary artery disease Paternal Grandmother    Lung cancer Paternal Aunt    Multiple sclerosis Neg Hx    Esophageal cancer Neg Hx    Rectal cancer Neg Hx    Stomach cancer Neg Hx    Breast cancer Neg Hx     SOCIAL HISTORY: Social History   Socioeconomic History   Marital status: Married    Spouse name: Not on file   Number of children: 3   Years of education: 9 th   Highest education level: Not on file  Occupational History   Occupation: Disabled     Employer: DISABLED  Tobacco Use   Smoking status: Former    Packs/day: 2.00    Years: 30.00    Total pack years: 60.00    Types: Cigarettes    Quit date: 06/17/2019    Years since quitting: 2.3   Smokeless tobacco: Never  Vaping Use   Vaping Use: Never used  Substance and Sexual Activity    Alcohol use: No    Alcohol/week: 0.0 standard drinks of alcohol   Drug use: No   Sexual activity: Not on file  Other Topics Concern   Not on file  Social History Narrative   Married with 3 children   Daily caffeine use - 2    Disabled.   Education 9th grade    Caffeine one cup of coffee daily.   Patient is right handed.    Social Determinants of Health   Financial Resource Strain: Low Risk  (09/29/2021)   Overall Financial Resource Strain (CARDIA)    Difficulty of Paying Living Expenses: Not hard at all  Food Insecurity: No Food Insecurity (09/29/2021)   Hunger Vital Sign    Worried About Running Out of Food in the Last Year: Never true    Ran Out of Food in the Last Year: Never true  Transportation Needs: No Transportation Needs (09/29/2021)   PRAPARE - Hydrologist (Medical): No    Lack of Transportation (Non-Medical): No  Physical Activity: Inactive (09/29/2021)   Exercise Vital Sign    Days of Exercise per Week: 0 days    Minutes of Exercise per Session: 0 min  Stress: No Stress Concern Present (09/29/2021)   [place]    Feeling of Stress : Only a little  Social Connections: Moderately Isolated (09/29/2021)   Social Connection and Isolation Panel [NHANES]    Frequency of Communication with Friends and Family: Never    Frequency of Social Gatherings with Friends and Family: Twice a week    Attends Religious Services: More than 4 times per year    Active Member of [employer] or Organizations: No    Attends Archivist Meetings: Never  Marital Status: Married  Human resources officer Violence: Not At Risk (09/29/2021)   Humiliation, Afraid, Rape, and Kick questionnaire    Fear of Current or Ex-Partner: No    Emotionally Abused: No    Physically Abused: No    Sexually Abused: No   PHYSICAL EXAM  Vitals:   10/07/21 1249  BP: 106/68  Pulse: 80  Weight: 189 lb 8 oz (86 kg)   Height: 5' (1.524 m)   Body mass index is 37.01 kg/m.  Generalized: Well developed, in no acute distress  Neurological examination  Mentation: Alert oriented to time, place, history taking. Follows all commands speech and language fluent Cranial nerve II-XII: Pupils were equal round reactive to light. Extraocular movements were full, visual field were full on confrontational test. Facial sensation and strength were normal. Head turning and shoulder shrug  were normal and symmetric. Motor: The motor testing reveals 5 over 5 strength of all 4 extremities. Good symmetric motor tone is noted throughout.  No right-sided weakness was noted. Sensory: Reportedly decreased sensation to right side with soft touch Coordination: Cerebellar testing reveals good finger-nose-finger and heel-to-shin bilaterally.  Gait and station: Gait is normal. Tandem gait is normal.  Reflexes: Deep tendon reflexes are symmetric and normal bilaterally.  Skin: single ball eraser size scab to middle occipital area, noted to be scratching her head during visit  DIAGNOSTIC DATA (LABS, IMAGING, TESTING) - I reviewed patient records, labs, notes, testing and imaging myself where available.  Lab Results  Component Value Date   WBC 6.2 05/07/2021   HGB 15.1 (H) 05/07/2021   HCT 46.1 (H) 05/07/2021   MCV 95.1 05/07/2021   PLT 193.0 05/07/2021      Component Value Date/Time   NA 139 09/28/2021 1229   NA 136 02/23/2021 1041   NA 136 07/09/2013 1803   K 4.4 09/28/2021 1229   K 4.2 07/09/2013 1803   CL 99 09/28/2021 1229   CL 105 07/09/2013 1803   CO2 31 09/28/2021 1229   CO2 22 07/09/2013 1803   GLUCOSE 80 09/28/2021 1229   GLUCOSE 96 07/09/2013 1803   BUN 15 09/28/2021 1229   BUN 7 (L) 02/23/2021 1041   BUN 16 07/09/2013 1803   CREATININE 1.04 09/28/2021 1229   CREATININE 1.20 (H) 04/20/2019 1444   CALCIUM 9.9 09/28/2021 1229   CALCIUM 9.6 07/09/2013 1803   PROT 7.4 05/07/2021 0847   PROT 6.9 11/21/2014 1137    ALBUMIN 4.2 05/07/2021 0847   ALBUMIN 4.3 11/21/2014 1137   AST 18 05/07/2021 0847   ALT 14 05/07/2021 0847   ALKPHOS 70 05/07/2021 0847   BILITOT 0.3 05/07/2021 0847   BILITOT 0.3 11/21/2014 1137   GFRNONAA >60 08/18/2020 0835   GFRNONAA 55 (L) 07/09/2013 1803   GFRAA >60 08/22/2019 1602   GFRAA >60 07/09/2013 1803   Lab Results  Component Value Date   CHOL 258 (H) 05/07/2021   HDL 69.00 05/07/2021   LDLCALC 173 (H) 05/07/2021   LDLDIRECT 206.5 01/24/2013   TRIG 84.0 05/07/2021   CHOLHDL 4 05/07/2021   Lab Results  Component Value Date   HGBA1C 5.9 05/07/2021   Lab Results  Component Value Date   VITAMINB12 259 05/07/2021   Lab Results  Component Value Date   TSH 2.11 05/07/2021   Butler Denmark, AGNP-C, DNP 10/07/2021, 1:31 PM [facility] [address] [place], [place] [address] [phone]

## 2021-10-13 ENCOUNTER — Ambulatory Visit
Admission: RE | Admit: 2021-10-13 | Discharge: 2021-10-13 | Disposition: A | Discharge status: Home / Self Care | Payer: Medicare HMO | Source: Ambulatory Visit | Attending: Family Medicine | Admitting: Family Medicine

## 2021-10-13 DIAGNOSIS — R519 Headache, unspecified: Secondary | ICD-10-CM

## 2021-10-13 DIAGNOSIS — R200 Anesthesia of skin: Secondary | ICD-10-CM

## 2021-10-13 DIAGNOSIS — G35 Multiple sclerosis: Secondary | ICD-10-CM

## 2021-12-02 ENCOUNTER — Telehealth: Payer: Self-pay

## 2021-12-02 NOTE — Progress Notes (Signed)
Chronic Care Management Pharmacy Assistant   Name: WILL SCHIER  MRN: [id number] DOB: 09-08-1955  Reason for Encounter: CCM (Appointment Reminder)  Medications: Outpatient Encounter Medications as of 12/02/2021  Medication Sig   albuterol (VENTOLIN HFA) 108 (90 Base) MCG/ACT inhaler INHALE 2 PUFFS UP TO EVERY 4 HOURS AS NEEDED FOR WHEEZING   ALPRAZolam (XANAX) 0.5 MG tablet Take 1 tablet (0.5 mg total) by mouth 2 (two) times daily as needed. For anxiety.   amoxicillin-clavulanate (AUGMENTIN) 875-125 MG tablet Take 1 tablet by mouth 2 (two) times daily.   aspirin EC 81 MG tablet Take 1 tablet (81 mg total) by mouth daily. Swallow whole.   Blood Glucose Monitoring Suppl (ACCU-CHEK AVIVA PLUS) w/Device KIT Check blood sugar once daily and as directed Dx R73.09   budesonide-formoterol (SYMBICORT) 160-4.5 MCG/ACT inhaler Inhale 2 puffs into the lungs 2 (two) times daily.   chlorhexidine (PERIDEX) 0.12 % solution SMARTSIG:By Mouth   cholecalciferol (VITAMIN D3) 25 MCG (1000 UNIT) tablet Take 1,000 Units by mouth daily.   fluticasone (FLONASE) 50 MCG/ACT nasal spray Place 1 spray into both nostrils daily.   gabapentin (NEURONTIN) 300 MG capsule Take 600 mg by mouth at bedtime.   ipratropium-albuterol (DUONEB) 0.5-2.5 (3) MG/3ML SOLN TAKE 3 MLS BY NEBULIZATION EVERY 6 (SIX) HOURS AS NEEDED.   Multiple Vitamin (MULTIVITAMIN) tablet Take 1 tablet by mouth daily.   pantoprazole (PROTONIX) 40 MG tablet Take 40 mg by mouth daily as needed.   rosuvastatin (CRESTOR) 20 MG tablet Take 1 tablet (20 mg total) by mouth daily.   Spacer/Aero-Holding Chambers (AEROCHAMBER MV) inhaler Use as instructed   No facility-administered encounter medications on file as of 12/02/2021.   Delitha M Ritacco was contacted to remind of upcoming telephone visit with Charlene Brooke on 12/07/2021 at 3:00. Patient was reminded to have any blood glucose and blood pressure readings available for review at appointment.    Message was left reminding patient of appointment.   CCM referral has been placed prior to visit?  No   Star Rating Drugs: Medication:  Last Fill: Day Supply Rosuvastatin 20 mg 10/02/2021 [place], CPP notified  Marijean Niemann, [place] Pharmacy Assistant [phone]

## 2021-12-07 ENCOUNTER — Ambulatory Visit: Payer: Medicare HMO | Admitting: Pharmacist

## 2021-12-07 ENCOUNTER — Telehealth: Payer: Self-pay | Admitting: Pharmacist

## 2021-12-07 DIAGNOSIS — J4489 Other specified chronic obstructive pulmonary disease: Secondary | ICD-10-CM

## 2021-12-07 DIAGNOSIS — E7800 Pure hypercholesterolemia, unspecified: Secondary | ICD-10-CM

## 2021-12-07 MED ORDER — PRAVASTATIN SODIUM 40 MG PO TABS: 40.0000 mg | ORAL_TABLET | Freq: Every day | ORAL | 1 refills | Status: DC

## 2021-12-07 NOTE — Telephone Encounter (Signed)
I sent it to the pharmacy  Thanks so much !  Let us know if any side effects   [place]-please schedule fasting lipids in about 6 weeks to see how it works

## 2021-12-07 NOTE — Progress Notes (Signed)
Chronic Care Management Pharmacy Note  12/08/2021 Name:  Karina [name] MRN:  [id number] DOB:  18-Aug-1955  Summary: CCM F/U visit -Pt is not taking rosuvastatin due to perception that is caused GI upset, she reports she can only tolerate brand Crestor, which is unaffordable -Pt was supposed to have MRI brain in August (neck pain/headache, numbness w/ history of MS) but could not due MRI due to inability to lie flat, pt says it was because sh could not have "cage on my face" due to breathing issues with COPD;  Recommendations/Changes made from today's visit: -Trial pravastatin 40 mg daily -will alert prescriber of MRI difficulties in case there are accommodations/alternatives  Plan: -[place] will call patient in 2 weeks to assess pravastatin tolerability -Pharmacist follow up televisit scheduled for 6 months -PCP annual visit due 05/2022    Subjective: Karina [name] is an 66 y.o. year old female who is a primary patient of [name], Wynelle Fanny, MD.  The CCM team was consulted for assistance with disease management and care coordination needs.    Engaged with patient by telephone for follow up visit in response to provider referral for pharmacy case management and/or care coordination services.   Consent to Services:  The patient was given information about Chronic Care Management services, agreed to services, and gave verbal consent prior to initiation of services.  Please see initial visit note for detailed documentation.   Patient Care Team: [name], Wynelle Fanny, MD as PCP - General Jannifer Franklin Elon Alas, MD (Inactive) as Consulting Physician (Neurology) Minna Merritts, MD as Consulting Physician (Cardiology) Rutherford Guys, MD as Consulting Physician (Ophthalmology) Charlton Haws, [facility] as Pharmacist (Pharmacist)  Recent office visits: 09/28/21 Dr Lorelei Pont OV: headache, R side numbness. Order MRI brain. Rx Augmentin for possible sinus infxn.  08/14/21 Dr Glori Bickers OV:  back/shoulder pain - rx'd prednisone. Rx'd tramadol.  05/07/21-PCP-Marne [name],MD- AWV-Intol of atorvastatin-reduced smoking-She is open to ifob kit-this was orderd-labs ordered-discussed screenings and vaccines, diet and weight loss.No medication changes  Recent consult visits: 10/07/21 NP Butler Denmark (Neurology): MS f/u. MRI next week. RTC 6 months - Dr Felecia Shelling.  08/27/21 Dr Halford Chessman (Pulmonary): COPD - no changes. F/u 1 year  08/21/21 Urgent Care - headache. F/u with PCP.  04/17/21-Cardiology-Timothy Gollan,MD-New consult for shortness of breath.Patient not interested in scans, unable to tolerate metoprolol,ordered crestor 10-20 mg. Take 1 tablet daily, aspirin 20m take 1 tablet daily . 03/25/21-Pulmonology-Elizabeth Walsh,NP- Follow up COPD , chronic bronchitis.Reduced smoking,using symbicort, unable to tolerate Breztri.On nicotine patch Sending in Doxycycline 1067mBID x 7 days and prednisone for bronchitis symptoms/wheezing heard on exam. Advised she take mucinex 60050mwice daily and use flutter valve 2-3 times a day to help with mucoid clearance 02/23/21-Cardiology-Timothy Gollan,MD-Initial visit for SOB,fatigue,skipping heart beat.EKG,labs ordered,scans ordered,no medication changes 02/10/21-Pulmonology-Vineet Sood,MD-Cough not resolving-Try sample of breztri two puffs twice per day, and rinse your mouth after each use. 01/22/21-Dermatology-David Kowalski,MD- cryotherapy 01/08/21-Pulmonology-Vineet Sood,MD- post covid follow up    Hospital visits: None in previous 6 months   Objective:  Lab Results  Component Value Date   CREATININE 1.04 09/28/2021   BUN 15 09/28/2021   GFR 57.13 (L) 09/28/2021   EGFR 61 02/23/2021   GFRNONAA >60 08/18/2020   GFRAA >60 08/22/2019   NA 139 09/28/2021   K 4.4 09/28/2021   CALCIUM 9.9 09/28/2021   CO2 31 09/28/2021   GLUCOSE 80 09/28/2021    Lab Results  Component Value Date/Time   HGBA1C 5.9 05/07/2021 08:47  AM   HGBA1C 5.4 04/20/2019 02:44 PM   GFR  57.13 (L) 09/28/2021 12:29 PM   GFR 52.98 (L) 05/07/2021 08:47 AM    Last diabetic Eye exam: No results found for: "HMDIABEYEEXA"  Last diabetic Foot exam: No results found for: "HMDIABFOOTEX"   Lab Results  Component Value Date   CHOL 258 (H) 05/07/2021   HDL 69.00 05/07/2021   LDLCALC 173 (H) 05/07/2021   LDLDIRECT 206.5 01/24/2013   TRIG 84.0 05/07/2021   CHOLHDL 4 05/07/2021       Latest Ref Rng & Units 05/07/2021    8:47 AM 08/18/2020    8:35 AM 08/15/2020    8:22 AM  Hepatic Function  Total Protein 6.0 - 8.3 g/dL 7.4  6.7  7.1   Albumin 3.5 - 5.2 g/dL 4.2  2.4  3.2   AST 0 - 37 U/L 18  37  20   ALT 0 - 35 U/L _0 Alk Phosphatase 39 - 117 U/L 70  66  57   Total Bilirubin 0.2 - 1.2 mg/dL 0.3  0.6  0.6     Lab Results  Component Value Date/Time   TSH 2.11 05/07/2021 08:47 AM   TSH 1.66 04/20/2019 02:44 PM   FREET4 0.93 08/11/2017 04:44 PM       Latest Ref Rng & Units 05/07/2021    8:47 AM 08/27/2020   12:01 PM 08/18/2020    8:35 AM  CBC  WBC 4.0 - 10.5 K/uL 6.2  7.7  11.7   Hemoglobin 12.0 - 15.0 g/dL 15.1  12.4  12.1   Hematocrit 36.0 - 46.0 % 46.1  36.9  35.8   Platelets 150.0 - 400.0 K/uL 193.0  361.0  283     Lab Results  Component Value Date/Time   VD25OH 45.27 05/07/2021 08:47 AM   VD25OH 77.69 07/24/2019 11:44 AM    Clinical ASCVD: Yes - co The 10-year ASCVD risk score (Arnett DK, et al., 2019) is: 3.3%   Values used to calculate the score:     Age: 71 years     Sex: Female     Is Non-Hispanic African American: No     Diabetic: No     Tobacco smoker: No     Systolic Blood Pressure: 802 mmHg     Is BP treated: No     HDL Cholesterol: 69 mg/dL     Total Cholesterol: 258 mg/dL       09/29/2021    1:03 PM 08/14/2021   11:36 AM 05/07/2021    8:09 AM  Depression screen PHQ 2/9  Decreased Interest 0 0 0  Down, Depressed, Hopeless 0 0 0  PHQ - 2 Score 0 0 0  Altered sleeping 0    Tired, decreased energy 0    Change in appetite 0    Feeling  bad or failure about yourself  0    Trouble concentrating 0    Moving slowly or fidgety/restless 0    Suicidal thoughts 0    PHQ-9 Score 0    Difficult doing work/chores Not difficult at all       Social History   Tobacco Use  Smoking Status Former   Packs/day: 2.00   Years: 30.00   Total pack years: 60.00   Types: Cigarettes   Quit date: 06/17/2019   Years since quitting: 2.4  Smokeless Tobacco Never   BP Readings from Last 3 Encounters:  10/07/21 106/68  09/28/21 126/74  08/27/21 134/80   Pulse Readings from Last 3 Encounters:  10/07/21 80  09/28/21 74  08/27/21 85   Wt Readings from Last 3 Encounters:  10/07/21 189 lb 8 oz (86 kg)  09/29/21 186 lb (84.4 kg)  09/28/21 186 lb 6 oz (84.5 kg)   BMI Readings from Last 3 Encounters:  10/07/21 37.01 kg/m  09/29/21 36.33 kg/m  09/28/21 36.40 kg/m    Assessment/Interventions: Review of patient past medical history, allergies, medications, health status, including review of consultants reports, laboratory and other test data, was performed as part of comprehensive evaluation and provision of chronic care management services.   SDOH:  (Social Determinants of Health) assessments and interventions performed: Yes SDOH Interventions    Flowsheet Row Clinical Support from 09/29/2021 in [place] at [facility] from 06/10/2021 in [place] at [facility] from 10/17/2019 in [place] at [facility] Interventions Intervention Not Indicated Intervention Not Indicated --  Housing Interventions Intervention Not Indicated -- --  Transportation Interventions Intervention Not Indicated -- --  Depression Interventions/Treatment  -- -- YWV3-7 Score <4 Follow-up Not Indicated  Financial Strain Interventions Intervention Not Indicated Intervention Not Indicated --  Physical Activity Interventions Patient Refused -- --  Stress  Interventions Intervention Not Indicated -- --  Social Connections Interventions Intervention Not Indicated -- --      SDOH Screenings   Food Insecurity: No Food Insecurity (09/29/2021)  Housing: Low Risk  (09/29/2021)  Transportation Needs: No Transportation Needs (09/29/2021)  Alcohol Screen: Low Risk  (09/29/2021)  Depression (PHQ2-9): Low Risk  (09/29/2021)  Financial Resource Strain: Low Risk  (09/29/2021)  Physical Activity: Inactive (09/29/2021)  Social Connections: Moderately Isolated (09/29/2021)  Stress: No Stress Concern Present (09/29/2021)  Tobacco Use: Medium Risk (10/07/2021)    CCM Care Plan  Allergies  Allergen Reactions   Percocet [Oxycodone-Acetaminophen] Shortness Of Breath    Felt like going to pass out and sweating   Tecfidera [Dimethyl Fumarate] Shortness Of Breath, Palpitations, Rash and Cough   Amitriptyline Hcl Itching and Swelling   Atorvastatin Other (See Comments)    elevated LFT's   Betaseron [Interferon Beta-1b] Other (See Comments)    Headache   Clarithromycin Other (See Comments)    reaction not known   Crestor [Rosuvastatin]     Stomach upset    Duloxetine Other (See Comments)    pain   Pregabalin Other (See Comments)    LE swelling   Pseudoephedrine Other (See Comments)    legs hurt   Zoloft [Sertraline Hcl] Other (See Comments)    Headaches    Levofloxacin Rash    Medications Reviewed Today     Reviewed by Reyne Dumas, CMA (Certified Medical Assistant) on 10/07/21 at 1251  Med List Status: <None>   Medication Order Taking? Sig Documenting Provider Last Dose Status Informant  albuterol (VENTOLIN HFA) 108 (90 Base) MCG/ACT inhaler [id number] Yes INHALE 2 PUFFS UP TO EVERY 4 HOURS AS NEEDED FOR WHEEZING [name], Wynelle Fanny, MD Taking Active   ALPRAZolam Duanne Moron) 0.5 MG tablet [id number] Yes Take 1 tablet (0.5 mg total) by mouth 2 (two) times daily as needed. For anxiety. [name], Wynelle Fanny, MD Taking Active Self  amoxicillin-clavulanate (AUGMENTIN)  875-125 MG tablet [id number] Yes Take 1 tablet by mouth 2 (two) times daily. Owens Loffler, MD Taking Active   aspirin EC 81 MG tablet [id number] Yes Take 1 tablet (81 mg total) by mouth daily. Swallow  whole. Minna Merritts, MD Taking Active   Blood Glucose Monitoring Suppl (ACCU-CHEK AVIVA PLUS) w/Device KIT [id number] Yes Check blood sugar once daily and as directed Dx R73.09 [name], Wynelle Fanny, MD Taking Active Self  budesonide-formoterol [facility]) 160-4.5 MCG/ACT inhaler [id number] Yes Inhale 2 puffs into the lungs 2 (two) times daily. Chesley Mires, MD Taking Active   chlorhexidine (PERIDEX) 0.12 % solution [id number] Yes SMARTSIG:By Mouth [provider] Taking Active   cholecalciferol (VITAMIN D3) 25 MCG (1000 UNIT) tablet [id number] Yes Take 1,000 Units by mouth daily. [provider] Taking Active   fluticasone (FLONASE) 50 MCG/ACT nasal spray [id number] Yes Place 1 spray into both nostrils daily. Chesley Mires, MD Taking Active   gabapentin (NEURONTIN) 300 MG capsule [id number] Yes Take 600 mg by mouth at bedtime. [provider] Taking Active   ipratropium-albuterol (DUONEB) 0.5-2.5 (3) MG/3ML SOLN [id number] Yes TAKE 3 MLS BY NEBULIZATION EVERY 6 (SIX) HOURS AS NEEDED. Laverle Hobby, MD Taking Active Self  Multiple Vitamin (MULTIVITAMIN) tablet [id number] Yes Take 1 tablet by mouth daily. [provider] Taking Active   pantoprazole (PROTONIX) 40 MG tablet [id number] Yes Take 40 mg by mouth daily as needed. [provider] Taking Active Self  rosuvastatin (CRESTOR) 20 MG tablet [id number] Yes Take 1 tablet (20 mg total) by mouth daily. [name], Wynelle Fanny, MD Taking Active   Spacer/Aero-Holding Chambers (AEROCHAMBER MV) inhaler [id number] Yes Use as instructed Chesley Mires, MD Taking Active             Patient Active Problem List   Diagnosis Date Noted   Left shoulder pain 08/14/2021   Occult blood positive stool 05/25/2021   Colon cancer  screening 05/07/2021   Estrogen deficiency 05/07/2021   Acute bronchitis with COPD ([place]) 03/31/2021   Rheumatoid arthritis ([place]) 03/31/2021   Coronary artery calcification 03/31/2021   Hypokalemia 08/27/2020   Joint pain in both hands 10/12/2019   Head pain 08/22/2019   Elevated serum creatinine 04/24/2019   Frequent urination 04/20/2019   Decreased appetite 04/20/2019   Encounter for screening mammogram for breast cancer 04/20/2019   Seborrheic keratosis, inflamed 12/15/2017   B12 deficiency 08/24/2017   Left low back pain 08/24/2017   Erosive osteoarthritis of both hands 09/09/2016   Fatigue 09/07/2016   Bilateral hand pain 09/07/2016   Routine general medical examination at a health care facility 05/06/2015   Large breasts 05/06/2015   Lumbar disc disease 11/01/2014   Knee pain, bilateral 03/11/2014   Thoracic back pain 11/30/2013   Encounter for routine gynecological examination 01/31/2013   Encounter for Medicare annual wellness exam 01/23/2013   Chest pain 09/20/2012   Dyspnea 09/13/2012   Asthmatic bronchitis , chronic 12/06/2011   Chronic cough 11/22/2011   Anxiety disorder 07/17/2009   HEMORRHOIDS-EXTERNAL 11/07/2008   IRRITABLE BOWEL SYNDROME 11/07/2008   Lung nodule 10/14/2008   Prediabetes 10/14/2008   PERSONAL HX COLONIC POLYPS 10/08/2008   Vitamin D deficiency 07/23/2008   SYNCOPE, HX OF 06/01/2008   FOOT SURGERY, HX OF 06/01/2008   DILATION AND CURETTAGE, HX OF 06/01/2008   OSTEOARTHRITIS, SHOULDER, RIGHT 03/22/2008   ROTATOR CUFF SYNDROME, RIGHT 03/22/2008   ARTHRALGIA 12/12/2007   Allergic rhinitis 08/01/2007   Current smoker 10/17/2006   Peripheral neuropathy 10/17/2006   FOOT PAIN, BILATERAL 10/17/2006   HYPOGLYCEMIA 09/30/2006   HYPERCHOLESTEROLEMIA 09/30/2006   MULTIPLE SCLEROSIS 09/30/2006   GERD 09/30/2006   FIBROCYSTIC BREAST DISEASE 09/30/2006    Immunization History  Administered Date(s) Administered   Influenza,inj,Quad PF,6+  Mos  01/31/2013, 03/11/2014, 12/15/2017   Pneumococcal Polysaccharide-23 07/29/2003   Td 03/08/2000   Tdap 01/31/2013    Conditions to be addressed/monitored:  Hyperlipidemia and COPD  Care Plan : [place]  Updates made by Charlton Haws, RPH since 12/08/2021 12:00 AM     Problem: Hyperlipidemia and COPD   Priority: High     Long-Range Goal: Disease mgmt   Start Date: 06/10/2021  Expected End Date: 06/11/2022  This Visit's Progress: Not on track  Recent Progress: On track  Priority: High  Note:   Current Barriers:  Does not contact provider office for questions/concerns Suboptimal HLD therapy  Pharmacist Clinical Goal(s):  Patient will contact provider office for questions/concerns as evidenced notation of same in electronic health record through collaboration with PharmD and provider.   Interventions: 1:1 collaboration with [name], Wynelle Fanny, MD regarding development and update of comprehensive plan of care as evidenced by provider attestation and co-signature Inter-disciplinary care team collaboration (see longitudinal plan of care) Comprehensive medication review performed; medication list updated in electronic medical record  Hyperlipidemia: (LDL goal < 70) -Uncontrolled - LDL 173 (05/2021) while pt was off statin therapy; previously 119; pt reports she has not been taking rosuvastatin due to GI side effects, she did not inform any providers about this -Current treatment: Rosuvastatin 20 mg daily - not taking Aspirin 81 mg daily - Appropriate, Effective, Safe, Accessible -Medications previously tried: atorvastatin (elevated LFTs) -Educated on Cholesterol goals; Benefits of statin for ASCVD risk reduction; -Recommended trial of pravastatin 40 mg daily  COPD (Goal: control symptoms and prevent exacerbations) -Controlled - pt reports she quit smoking 4 weeks ago, reports great improvement in breathing; she does need a refill for albuterol -Gold Grade: Gold 2  (FEV1 50-79%) -Current COPD Classification:  A (low sx, <2 exacerbations/yr) -Pulmonary function testing: 01/2021 - FEV1 0.64, ratio 0.42 -Exacerbations requiring treatment in last 6 months: 0 -Current treatment  Albuterol HFA- Appropriate, Effective, Safe, Accessible Symbicort 160-4.5 mcg/act 2 puff BID - Appropriate, Effective, Safe, Accessible Duoneb - rare use -Medications previously tried: Librarian, academic  -Patient reports consistent use of maintenance inhaler -Frequency of rescue inhaler use: daily -Counseled on Proper inhaler technique;Benefits of consistent maintenance inhaler use -Recommended to continue current medication  Pain (Goal: manage pain) -Controlled -Hx MS, peripheral neuropathy, lumbar disc disease, RA (apparently inactive) -Current treatment  Gabapentin 300 mg - 1 AM, 1 PM, 2 HS - Appropriate, Effective, Safe, Accessible -Medications previously tried: duloxetine, pregabalin (swelling) -Recommended to continue current medication  GERD (Goal: manage symptoms) -Controlled - pt reports taking PPI occasionally -Current treatment  Pantoprazole 40 mg daily PRN - Appropriate, Effective, Safe, Accessible -Medications previously tried: n/a  -Recommended to continue current medication  Health Maintenance -Vaccine gaps: none -Hx MS, follows with neurology. Off DMARD since 2015.  -Pt was supposed to have MRI brain in August (neck pain/headache, numbness w/ history of MS) but could not due MRI due to inability to lie flat, pt says it was because sh could not have "cage on my face" due to breathing issues with COPD; will alert prescriber of MRI difficulties in case there are accommodations/alternatives  Patient Goals/Self-Care Activities Patient will:  - take medications as prescribed as evidenced by patient report and record review focus on medication adherence by routine      Medication Assistance: None required.  Patient affirms current coverage meets  needs.  Compliance/Adherence/Medication fill history: Care Gaps: Mammogram  Star-Rating Drugs: None  Medication Access: Within the past 30  days, how often has patient missed a dose of medication? 0 Is a pillbox or other method used to improve adherence? No  Factors that may affect medication adherence? adverse effects of medications Are meds synced by current pharmacy? No  Are meds delivered by current pharmacy? No  Does patient experience delays in picking up medications due to transportation concerns? No   [facility] Services Reviewed: Is patient disadvantaged to use [facility]?: No  Current Rx insurance plan: [employer] Name and location of Current pharmacy:  [id number] [place] [address]Phone: [phone]: [phone] [facility] services reviewed with patient today?: No  Patient requests to transfer care to [facility]?: No  Reason patient declined to change pharmacies: Not mentioned at this visit   Care Plan and Follow Up Patient Decision:  Patient agrees to Care Plan and Follow-up.  Plan: Telephone follow up appointment with care management team member scheduled for:  6 months  LCharlene Brooke PharmD, BCACP Clinical Pharmacist [phone]

## 2021-12-07 NOTE — Telephone Encounter (Signed)
Patient reports she has not been taking rosuvastatin due to it causing GI upset. She has apparently been able to tolerate brand Crestor but insurance does not cover this and it is unaffordable.   Pt has not tried pravastatin and is willing to try it.  Recommend trial of pravastatin 40 mg daily.

## 2021-12-08 ENCOUNTER — Telehealth: Payer: Self-pay | Admitting: Pharmacist

## 2021-12-08 NOTE — Telephone Encounter (Signed)
Karina [name],   I wanted to send this question to [facility], and you saw her last - former Dr. Jannifer Franklin patient.  I saw her for an acute visit a few months ago.  Her last MRI was at [facility], so I thought that the [place] machine may be able to help accommodate her needs.  Any help appreciated.

## 2021-12-08 NOTE — Telephone Encounter (Signed)
Patient saw Dr Lorelei Pont in July and MRI brain was ordered to evaluate R-sided numbness, headache w/ history of MS. She also saw neurology in August and MRI was part of their plan as well to monitor MS.   Patient was not able to complete MRI in August due to inability to lie flat, and per patient her inability to lie with the "cage on my face". Pt has had MRI brain done before and reports she was not required to lie with the cage over her face for those scans, but this time staff insisted she would have to use the cage and pt could not tolerate this.  Routing to ordering provider in case MRI accommodations can be made, or alternate scan can be ordered.

## 2021-12-08 NOTE — Patient Instructions (Signed)
Visit Information  Phone number for Pharmacist: [phone]   Goals Addressed   None     Care Plan : [place]  Updates made by Charlton Haws, RPH since 12/08/2021 12:00 AM     Problem: Hyperlipidemia and COPD   Priority: High     Long-Range Goal: Disease mgmt   Start Date: 06/10/2021  Expected End Date: 06/11/2022  This Visit's Progress: Not on track  Recent Progress: On track  Priority: High  Note:   Current Barriers:  Does not contact provider office for questions/concerns Suboptimal HLD therapy  Pharmacist Clinical Goal(s):  Patient will contact provider office for questions/concerns as evidenced notation of same in electronic health record through collaboration with PharmD and provider.   Interventions: 1:1 collaboration with [name], Wynelle Fanny, MD regarding development and update of comprehensive plan of care as evidenced by provider attestation and co-signature Inter-disciplinary care team collaboration (see longitudinal plan of care) Comprehensive medication review performed; medication list updated in electronic medical record  Hyperlipidemia: (LDL goal < 70) -Uncontrolled - LDL 173 (05/2021) while pt was off statin therapy; previously 119; pt reports she has not been taking rosuvastatin due to GI side effects, she did not inform any providers about this -Current treatment: Rosuvastatin 20 mg daily - not taking Aspirin 81 mg daily - Appropriate, Effective, Safe, Accessible -Medications previously tried: atorvastatin (elevated LFTs) -Educated on Cholesterol goals; Benefits of statin for ASCVD risk reduction; -Recommended trial of pravastatin 40 mg daily  COPD (Goal: control symptoms and prevent exacerbations) -Controlled - pt reports she quit smoking 4 weeks ago, reports great improvement in breathing; she does need a refill for albuterol -Gold Grade: Gold 2 (FEV1 50-79%) -Current COPD Classification:  A (low sx, <2 exacerbations/yr) -Pulmonary  function testing: 01/2021 - FEV1 0.64, ratio 0.42 -Exacerbations requiring treatment in last 6 months: 0 -Current treatment  Albuterol HFA- Appropriate, Effective, Safe, Accessible Symbicort 160-4.5 mcg/act 2 puff BID - Appropriate, Effective, Safe, Accessible Duoneb - rare use -Medications previously tried: Librarian, academic  -Patient reports consistent use of maintenance inhaler -Frequency of rescue inhaler use: daily -Counseled on Proper inhaler technique;Benefits of consistent maintenance inhaler use -Recommended to continue current medication  Pain (Goal: manage pain) -Controlled -Hx MS, peripheral neuropathy, lumbar disc disease, RA (apparently inactive) -Current treatment  Gabapentin 300 mg - 1 AM, 1 PM, 2 HS - Appropriate, Effective, Safe, Accessible -Medications previously tried: duloxetine, pregabalin (swelling) -Recommended to continue current medication  GERD (Goal: manage symptoms) -Controlled - pt reports taking PPI occasionally -Current treatment  Pantoprazole 40 mg daily PRN - Appropriate, Effective, Safe, Accessible -Medications previously tried: n/a  -Recommended to continue current medication  Health Maintenance -Vaccine gaps: none -Hx MS, follows with neurology. Off DMARD since 2015.  -Pt was supposed to have MRI brain in August (neck pain/headache, numbness w/ history of MS) but could not due MRI due to inability to lie flat, pt says it was because sh could not have "cage on my face" due to breathing issues with COPD; will alert prescriber of MRI difficulties in case there are accommodations/alternatives  Patient Goals/Self-Care Activities Patient will:  - take medications as prescribed as evidenced by patient report and record review focus on medication adherence by routine      Patient verbalizes understanding of instructions and care plan provided today and agrees to view in [place]. Active MyChart status and patient understanding of how to access instructions and  care plan via MyChart confirmed with patient.    Telephone follow  up appointment with pharmacy team member scheduled for: 6 months  Charlene Brooke, PharmD, [facility] Clinical Pharmacist [facility] [facility] [phone]

## 2021-12-08 NOTE — Telephone Encounter (Signed)
Pt notified Rx sent and f/u fast lab appt scheduled

## 2021-12-09 NOTE — Telephone Encounter (Signed)
Can we see if this patient could have her MRI done at the open MRI at [facility] in [place]?

## 2021-12-10 NOTE — Telephone Encounter (Addendum)
Noted.   Will send order to [facility]. Will contact patient once sent - they can then call to schedule the appt.

## 2021-12-10 NOTE — Telephone Encounter (Signed)
Called and spoke with patient and gave number for [facility] to call and schedule her appt.   She states that she will call the office if she is unable to get the test done due to her claustrophobia.

## 2021-12-21 DIAGNOSIS — M542 Cervicalgia: Secondary | ICD-10-CM | POA: Diagnosis not present

## 2021-12-21 DIAGNOSIS — M25511 Pain in right shoulder: Secondary | ICD-10-CM | POA: Diagnosis not present

## 2021-12-24 ENCOUNTER — Telehealth: Payer: Self-pay

## 2021-12-24 NOTE — Chronic Care Management (AMB) (Signed)
Contacted the patient to see how she tolerated the pravastatin. She is unable to tolerate the medication as she is having stomach pain, itching, not sleeping well.The patient states she would rather go back to Crestor name brand . This was working very well with her system, not the rosuvastatin, however, she can tolerate the rosuvastatin if there is no choice. The patient states for years she was given the name brand Crestor, with the same insurance. The patient uses [facility] if providers want to refill Crestor.  01/18/22  Lab work  Charlene Brooke, CPP notified       Avel Sensor, [place]  [phone]

## 2021-12-24 NOTE — Telephone Encounter (Signed)
-----   Message from [place], [facility] sent at 12/08/2021 10:03 AM EDT ----- Regarding: call pt Pt recently started pravastatin since she could not tolerate rosuvastatin - how is she doing with pravastatin so far?

## 2021-12-28 MED ORDER — ROSUVASTATIN CALCIUM 10 MG PO TABS: 10.0000 mg | ORAL_TABLET | Freq: Every day | ORAL | 1 refills | Status: DC

## 2021-12-28 NOTE — Telephone Encounter (Signed)
Aware, thanks!

## 2021-12-28 NOTE — Telephone Encounter (Signed)
Brand Crestor was previously denied earlier this year. She was taking rosuvastatin 20 mg earlier this year and stopped due to GI upset. Will trial lower dose to see if this is tolerable.

## 2021-12-28 NOTE — Addendum Note (Signed)
Addended by: Charlton Haws on: 12/28/2021 02:52 PM   Modules accepted: Orders

## 2021-12-29 NOTE — Telephone Encounter (Signed)
Please inform patient a lower dose of rosuvastatin was sent to her pharmacy

## 2021-12-29 NOTE — Telephone Encounter (Signed)
Contacted the patient and informed her of low dose rosuvastatin sent to her pharmacy   Charlene Brooke, CPP notified  Avel Sensor, [place]  [phone]

## 2022-01-12 DIAGNOSIS — H5211 Myopia, right eye: Secondary | ICD-10-CM | POA: Diagnosis not present

## 2022-01-12 DIAGNOSIS — H52203 Unspecified astigmatism, bilateral: Secondary | ICD-10-CM | POA: Diagnosis not present

## 2022-01-12 DIAGNOSIS — H524 Presbyopia: Secondary | ICD-10-CM | POA: Diagnosis not present

## 2022-01-12 DIAGNOSIS — Z961 Presence of intraocular lens: Secondary | ICD-10-CM | POA: Diagnosis not present

## 2022-01-17 ENCOUNTER — Telehealth: Payer: Self-pay | Admitting: Family Medicine

## 2022-01-17 DIAGNOSIS — R7303 Prediabetes: Secondary | ICD-10-CM

## 2022-01-17 DIAGNOSIS — E7800 Pure hypercholesterolemia, unspecified: Secondary | ICD-10-CM

## 2022-01-17 NOTE — Telephone Encounter (Signed)
-----   Message from Ellamae Sia sent at 01/04/2022  4:04 PM EDT ----- Regarding: Lab orders for Monday, 11.13.23 Lab orders, thanks

## 2022-01-18 ENCOUNTER — Other Ambulatory Visit: Payer: Medicare HMO

## 2022-01-29 IMAGING — US US SOFT TISSUE HEAD/NECK
1 series · 10 of 10 positions shown · non-contrast
Comparison: Comparison made with prior brain MRI from 05/19/2017.

CLINICAL DATA: Initial evaluation for pain and swelling at left
occipital region.

EXAM:
ULTRASOUND OF HEAD/NECK SOFT TISSUES
TECHNIQUE: Ultrasound examination of the head and neck soft tissues was
performed in the area of clinical concern.

[Series 1: us soft tissue head/neck · 0.05mm/px · 10 acquisitions, 10 frames shown]
[im 1/10]
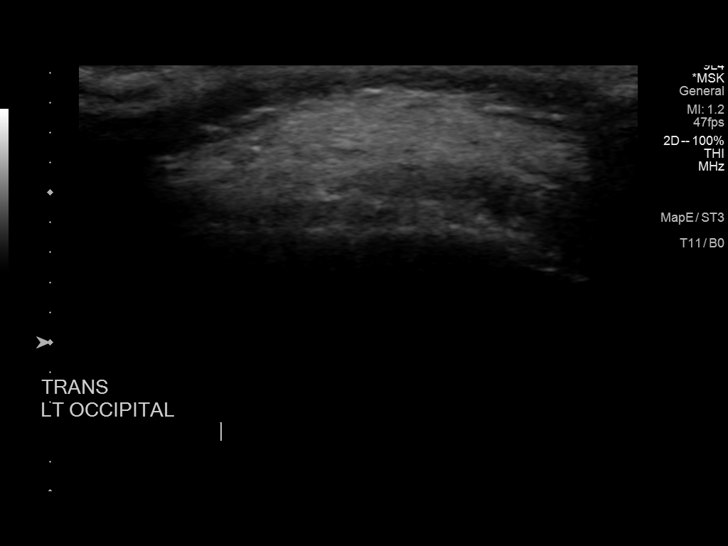
[im 2/10]
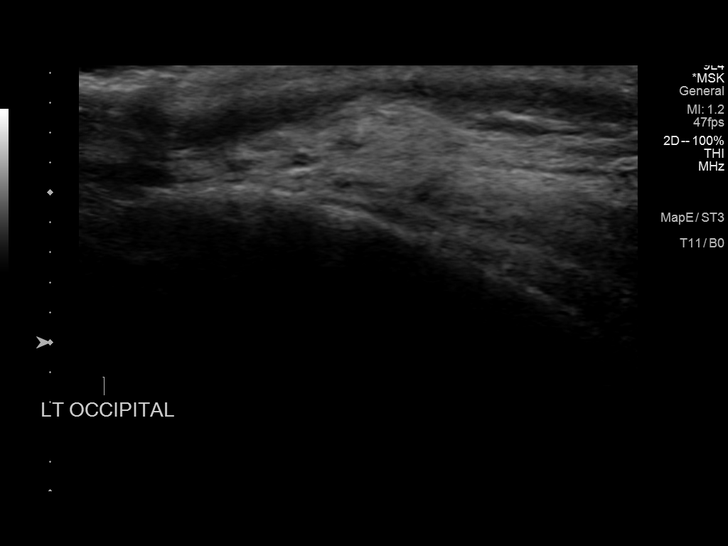
[im 3/10]
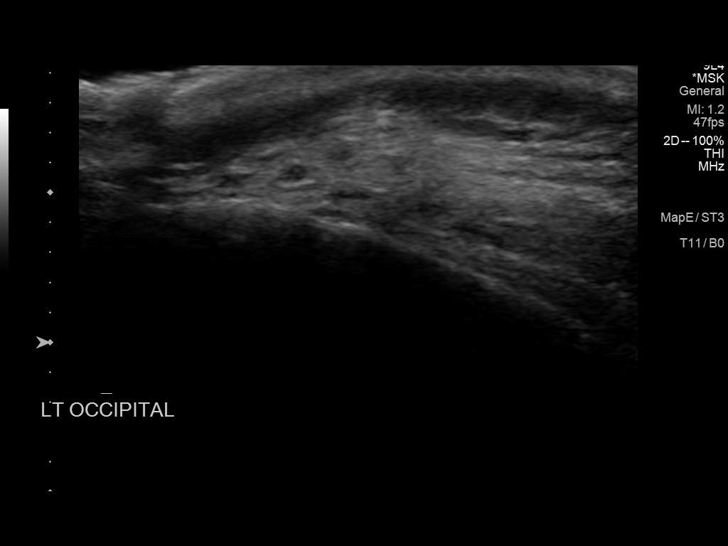
[im 4/10]
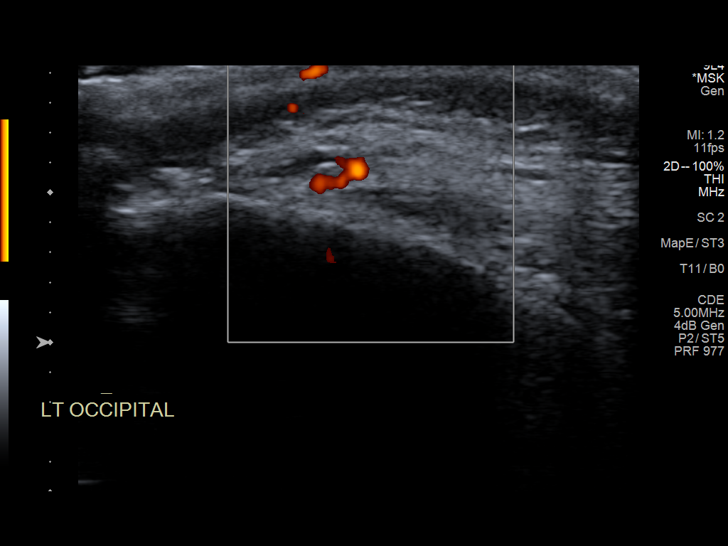
[im 5/10]
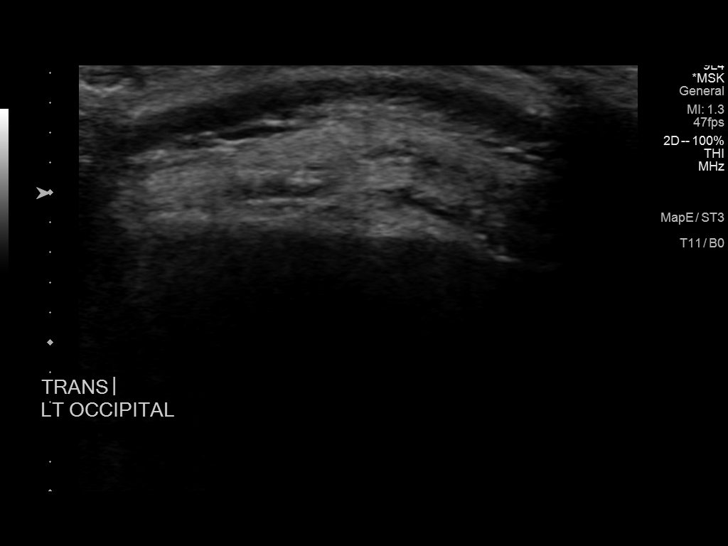
[im 6/10]
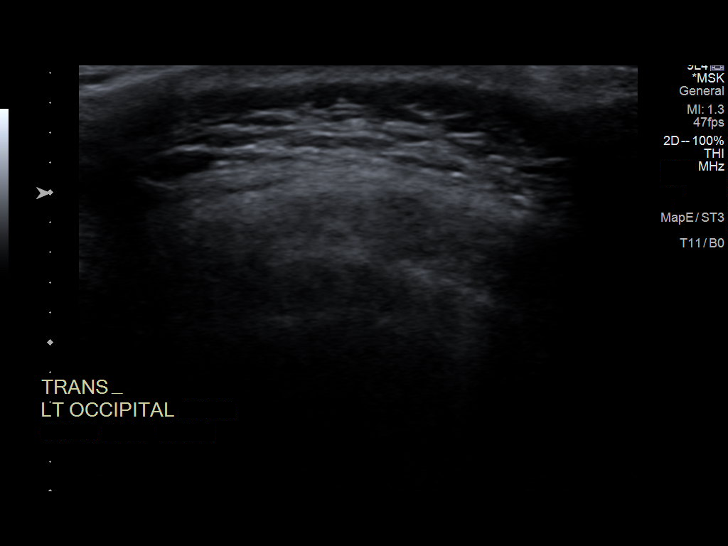
[im 7/10]
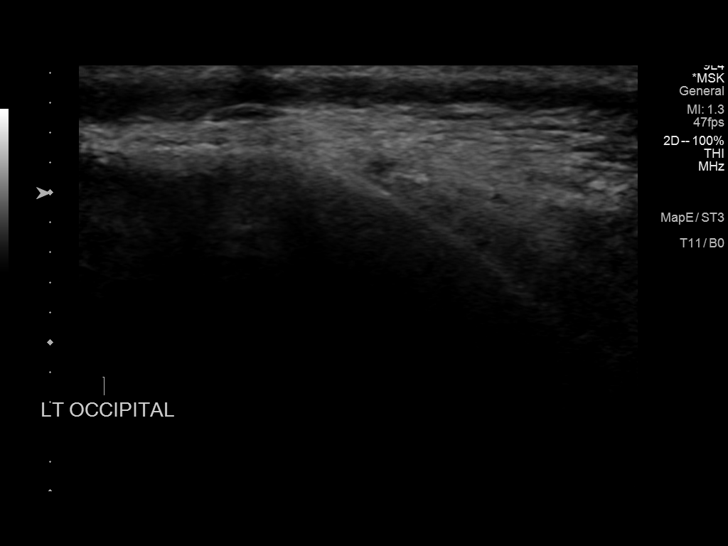
[im 8/10]
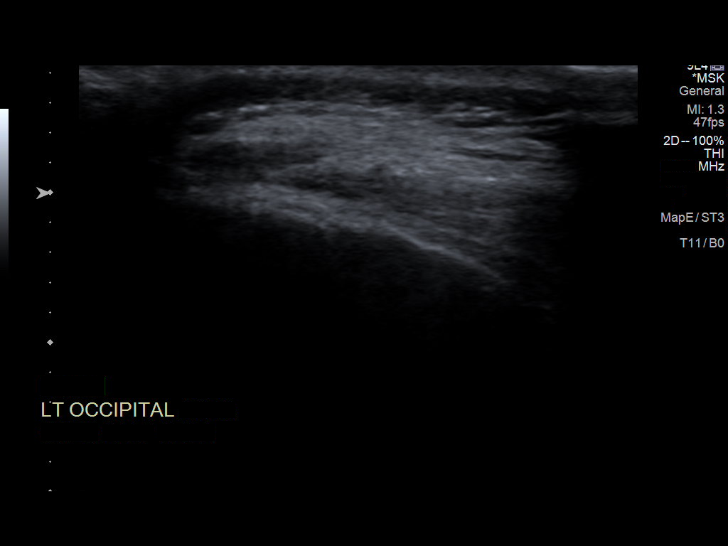
[im 9/10]
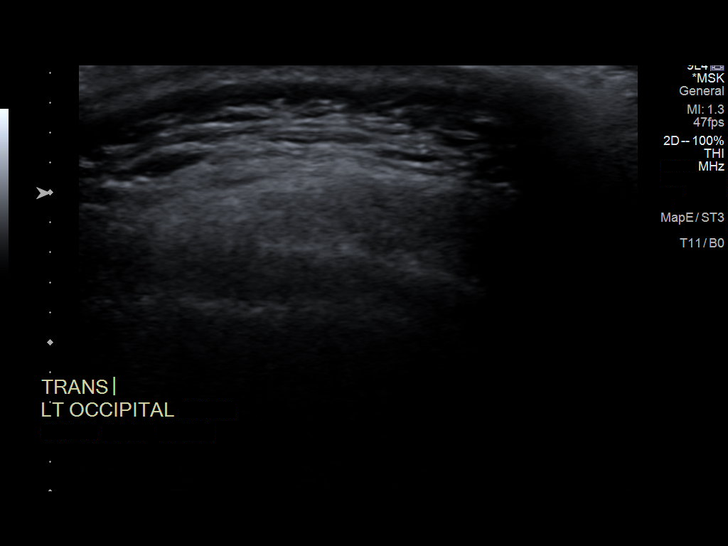
[im 10/10]
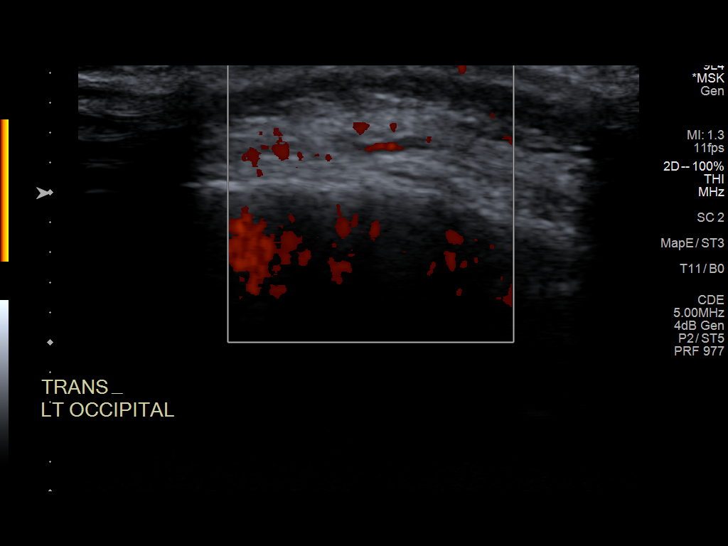

[10 of 10 positions shown; findings below may reference images not displayed]

FINDINGS: Target ultrasound of a palpable abnormality of concern at the left
occipital scalp was performed. Ultrasound demonstrates normal
appearing subcutaneous fat and musculature. No discrete mass or
collection. No enlarged lymph node or other focal abnormality. No
finding seen to correspond with the palpable abnormality of concern.
IMPRESSION: Negative ultrasound of the left occipital scalp. No focal
abnormality seen to correlate with the palpable abnormality of
concern. If there remains clinical concern for a possible underlying
occult lesion, further evaluation with dedicated head CT could be
performed for further evaluation as warranted.

## 2022-02-02 ENCOUNTER — Telehealth: Payer: Self-pay

## 2022-02-02 NOTE — Chronic Care Management (AMB) (Signed)
Contacted the pharmacy to see if patient had received the new low dose of Rosuvastatin . Patient had not picked up yet. Contacted the patient to remind her that a new low dose of rosuvastatin '10mg'$  was waiting at the pharmacy. Patient states she will pick up today .  Charlene Brooke, CPP notified  Avel Sensor, [place]  [phone]

## 2022-03-30 ENCOUNTER — Encounter: Payer: Self-pay | Admitting: Family Medicine

## 2022-03-30 ENCOUNTER — Ambulatory Visit (INDEPENDENT_AMBULATORY_CARE_PROVIDER_SITE_OTHER): Payer: Medicare HMO | Admitting: Family Medicine

## 2022-03-30 VITALS — BP 126/68 | HR 80 | Temp 98.0°F | Ht 60.0 in | Wt 192.2 lb

## 2022-03-30 DIAGNOSIS — J209 Acute bronchitis, unspecified: Secondary | ICD-10-CM

## 2022-03-30 DIAGNOSIS — R0989 Other specified symptoms and signs involving the circulatory and respiratory systems: Secondary | ICD-10-CM | POA: Diagnosis not present

## 2022-03-30 DIAGNOSIS — B309 Viral conjunctivitis, unspecified: Secondary | ICD-10-CM | POA: Insufficient documentation

## 2022-03-30 DIAGNOSIS — J069 Acute upper respiratory infection, unspecified: Secondary | ICD-10-CM | POA: Diagnosis not present

## 2022-03-30 DIAGNOSIS — J440 Chronic obstructive pulmonary disease with acute lower respiratory infection: Secondary | ICD-10-CM | POA: Diagnosis not present

## 2022-03-30 LAB — POC COVID19 BINAXNOW: SARS Coronavirus 2 Ag: NEGATIVE

## 2022-03-30 MED ORDER — PREDNISONE 10 MG PO TABS: ORAL_TABLET | ORAL | 0 refills | Status: DC

## 2022-03-30 MED ORDER — AMOXICILLIN-POT CLAVULANATE 875-125 MG PO TABS: 1.0000 | ORAL_TABLET | Freq: Two times a day (BID) | ORAL | 0 refills | Status: DC

## 2022-03-30 NOTE — Patient Instructions (Signed)
I think you have viral respiratory infection (with viral pink eye) and bronchitis  Possible early sinus infection   Drink fluids Treat your symptoms Mucinex dm is helpful  Nasal saline spray is good  Take the prednisone as directed   (will make you feel hyper and hungry)  Take the augmentin as directed   Update if not starting to improve in a week or if worsening    Negative covid test today   In the morning wipe away the eye crust with a clean warm wet cloth  If your vision suddenly changes let us know  Artificial tears may help also   Get rid of any eye makeup you used -it is contaminated  Try not to touch eyes  Wash linens frequently

## 2022-03-30 NOTE — Assessment & Plan Note (Signed)
Former smoker with viral uri (no fever and neg covid testing) Tx with prednisone 40 mg taper  Also augmentin (purulent sputum/sinus pain)  Update if not starting to improve in a week or if worsening  ER precautions noted Albuterol mdi prn  Disc side eff of prednisone

## 2022-03-30 NOTE — Progress Notes (Signed)
Subjective:    Patient ID: Karina [name], female    DOB: Aug 01, 1955, 67 y.o.   MRN: [id number]  HPI Pt presents for nasal congestion and eye problem  Wt Readings from Last 3 Encounters:  03/30/22 192 lb 4 oz (87.2 kg)  10/07/21 189 lb 8 oz (86 kg)  09/29/21 186 lb (84.4 kg)   37.55 kg/m  Vitals:   03/30/22 1210  BP: 126/68  Pulse: 80  Temp: 98 F (36.7 C)  SpO2: 97%   Her husband was sick  Symptoms started Thursday  Got dizzy /vertigo for few minutes  Friday her L eye was very red  Watery and discharge  Has crust in the am   Throat has been sore for several weeks Hoarse voice  Uncomfortable to swallow  Nasal congestion  Coughing a lot : phlegm is thick and green  Some wheezing   Sinus pain under eyes  Pnd  Mucous is green   No fever    Otc Mucinex Cough syrup Cough drops  No tylenol  No ibuprofen   Several otc nasal sprays   Took some old amox -helped a bit with ST   Results for orders placed or performed in visit on 03/30/22  POC COVID-19  Result Value Ref Range   SARS Coronavirus 2 Ag Negative Negative      Dry skin  Tried many lotions  Skin hurts   Former smoker  Patient Active Problem List   Diagnosis Date Noted   Viral URI with cough 03/30/2022   Viral conjunctivitis 03/30/2022   Left shoulder pain 08/14/2021   Occult blood positive stool 05/25/2021   Colon cancer screening 05/07/2021   Estrogen deficiency 05/07/2021   Acute bronchitis with COPD ([place]) 03/31/2021   Rheumatoid arthritis ([place]) 03/31/2021   Coronary artery calcification 03/31/2021   Hypokalemia 08/27/2020   Joint pain in both hands 10/12/2019   Head pain 08/22/2019   Elevated serum creatinine 04/24/2019   Frequent urination 04/20/2019   Decreased appetite 04/20/2019   Encounter for screening mammogram for breast cancer 04/20/2019   Seborrheic keratosis, inflamed 12/15/2017   B12 deficiency 08/24/2017   Left low back pain 08/24/2017   Erosive  osteoarthritis of both hands 09/09/2016   Fatigue 09/07/2016   Bilateral hand pain 09/07/2016   Routine general medical examination at a health care facility 05/06/2015   Large breasts 05/06/2015   Lumbar disc disease 11/01/2014   Knee pain, bilateral 03/11/2014   Thoracic back pain 11/30/2013   Encounter for routine gynecological examination 01/31/2013   Encounter for Medicare annual wellness exam 01/23/2013   Chest pain 09/20/2012   Dyspnea 09/13/2012   Asthmatic bronchitis , chronic 12/06/2011   Chronic cough 11/22/2011   Anxiety disorder 07/17/2009   HEMORRHOIDS-EXTERNAL 11/07/2008   IRRITABLE BOWEL SYNDROME 11/07/2008   Lung nodule 10/14/2008   Prediabetes 10/14/2008   PERSONAL HX COLONIC POLYPS 10/08/2008   Vitamin D deficiency 07/23/2008   SYNCOPE, HX OF 06/01/2008   FOOT SURGERY, HX OF 06/01/2008   DILATION AND CURETTAGE, HX OF 06/01/2008   OSTEOARTHRITIS, SHOULDER, RIGHT 03/22/2008   ROTATOR CUFF SYNDROME, RIGHT 03/22/2008   ARTHRALGIA 12/12/2007   Allergic rhinitis 08/01/2007   Current smoker 10/17/2006   Peripheral neuropathy 10/17/2006   FOOT PAIN, BILATERAL 10/17/2006   HYPOGLYCEMIA 09/30/2006   HYPERCHOLESTEROLEMIA 09/30/2006   MULTIPLE SCLEROSIS 09/30/2006   GERD 09/30/2006   FIBROCYSTIC BREAST DISEASE 09/30/2006   Past Medical History:  Diagnosis Date   Allergic rhinitis    Allergy  Anxiety    [place]- [facility] [place]ock[place], no per pt   Cataract    Colon polyps 08/2008   Adenomatous   COPD (chronic obstructive pulmonary disease) ([place])    Depression    no per pt   Diabetes mellitus    Gastritis 08/2008   H. pylori   GERD (gastroesophageal reflux disease)    Hypercholesteremia    Lung nodule    Menopausal disorder    Multiple sclerosis (HCC)    Neuromuscular disorder (HCC)    Plantar fasciitis    Seizures ([place])    1 seizure in 2001 due to blood sugar dropping to 38, none since   Shingles    Left V1   Urinary incontinence    Vertigo    Past  Surgical History:  Procedure Laterality Date   CERVICAL DISCECTOMY  2004   x 2, Anterior; Fusion C4-5, C5-6, C6-7, Dr. Kary Kos   CHEST CT  11/2008   With small 4 mm nodule L lung base (rec re check in 1 year)   CHEST CT  07/2009   Re check chest CT - lung nodule stable   CHOLECYSTECTOMY     COLONOSCOPY  08/2008   Polyps/ re check 5 yrs   CT SINUS LTD W/O CM  02/2009   negative   DILATION AND CURETTAGE OF UTERUS     ESOPHAGOGASTRODUODENOSCOPY  08/2008   Erosive gastritis, h pylori (treated)   FOOT SURGERY     left plantar fascial problem   ROTATOR CUFF REPAIR     right   TONSILLECTOMY     TUBAL LIGATION     UPPER GASTROINTESTINAL ENDOSCOPY     Social History   Tobacco Use   Smoking status: Former    Packs/day: 2.00    Years: 30.00    Total pack years: 60.00    Types: Cigarettes    Quit date: 06/17/2019    Years since quitting: 2.7   Smokeless tobacco: Never  Vaping Use   Vaping Use: Never used  Substance Use Topics   Alcohol use: No    Alcohol/week: 0.0 standard drinks of alcohol   Drug use: No   Family History  Problem Relation Age of Onset   Migraines Mother    Heart attack Mother    Coronary artery disease Mother    Coronary artery disease Father        6 bypasses    Stroke Father    Diabetes Father    Renal Disease Father    Colon cancer Maternal Grandfather 65   Coronary artery disease Cousin    Coronary artery disease Paternal Grandmother    Lung cancer Paternal Aunt    Multiple sclerosis Neg Hx    Esophageal cancer Neg Hx    Rectal cancer Neg Hx    Stomach cancer Neg Hx    Breast cancer Neg Hx    Allergies  Allergen Reactions   Percocet [Oxycodone-Acetaminophen] Shortness Of Breath    Felt like going to pass out and sweating   Tecfidera [Dimethyl Fumarate] Shortness Of Breath, Palpitations, Rash and Cough   Amitriptyline Hcl Itching and Swelling   Atorvastatin Other (See Comments)    elevated LFT's   Betaseron [Interferon Beta-1b] Other  (See Comments)    Headache   Clarithromycin Other (See Comments)    reaction not known   Crestor [Rosuvastatin]     Stomach upset    Duloxetine Other (See Comments)    pain   Pregabalin Other (See Comments)  LE swelling   Pseudoephedrine Other (See Comments)    legs hurt   Zoloft [Sertraline Hcl] Other (See Comments)    Headaches    Levofloxacin Rash   Current Outpatient Medications on File Prior to Visit  Medication Sig Dispense Refill   albuterol (VENTOLIN HFA) 108 (90 Base) MCG/ACT inhaler INHALE 2 PUFFS UP TO EVERY 4 HOURS AS NEEDED FOR WHEEZING 3 each 3   ALPRAZolam (XANAX) 0.5 MG tablet Take 1 tablet (0.5 mg total) by mouth 2 (two) times daily as needed. For anxiety. 30 tablet 0   aspirin EC 81 MG tablet Take 1 tablet (81 mg total) by mouth daily. Swallow whole. 90 tablet 3   Blood Glucose Monitoring Suppl (ACCU-CHEK AVIVA PLUS) w/Device KIT Check blood sugar once daily and as directed Dx R73.09 1 kit 0   budesonide-formoterol (SYMBICORT) 160-4.5 MCG/ACT inhaler Inhale 2 puffs into the lungs 2 (two) times daily. 3 each 2   cholecalciferol (VITAMIN D3) 25 MCG (1000 UNIT) tablet Take 1,000 Units by mouth daily.     fluticasone (FLONASE) 50 MCG/ACT nasal spray Place 1 spray into both nostrils daily. 16 g 2   gabapentin (NEURONTIN) 300 MG capsule Take 600 mg by mouth at bedtime.     ipratropium-albuterol (DUONEB) 0.5-2.5 (3) MG/3ML SOLN TAKE 3 MLS BY NEBULIZATION EVERY 6 (SIX) HOURS AS NEEDED. 360 mL 0   Multiple Vitamin (MULTIVITAMIN) tablet Take 1 tablet by mouth daily.     pantoprazole (PROTONIX) 40 MG tablet Take 40 mg by mouth daily as needed.     rosuvastatin (CRESTOR) 10 MG tablet Take 1 tablet (10 mg total) by mouth daily. 90 tablet 1   Spacer/Aero-Holding Chambers (AEROCHAMBER MV) inhaler Use as instructed 1 each 0   No current facility-administered medications on file prior to visit.    Review of Systems  Constitutional:  Positive for appetite change and fatigue.  Negative for fever.  HENT:  Positive for congestion, postnasal drip, rhinorrhea, sinus pressure, sneezing, sore throat and voice change. Negative for ear pain.   Eyes:  Negative for pain and discharge.  Respiratory:  Positive for cough and wheezing. Negative for shortness of breath and stridor.   Cardiovascular:  Negative for chest pain.  Gastrointestinal:  Negative for diarrhea, nausea and vomiting.  Genitourinary:  Negative for frequency, hematuria and urgency.  Musculoskeletal:  Negative for arthralgias and myalgias.  Skin:  Negative for rash.  Neurological:  Positive for headaches. Negative for dizziness, weakness and light-headedness.  Psychiatric/Behavioral:  Negative for confusion and dysphoric mood.        Objective:   Physical Exam Constitutional:      General: She is not in acute distress.    Appearance: Normal appearance. She is well-developed. She is obese. She is not ill-appearing.  HENT:     Head: Normocephalic and atraumatic.     Comments: Bilateral maxillary and frontal sinus tenderness worse on R    Right Ear: Tympanic membrane, ear canal and external ear normal.     Left Ear: Tympanic membrane, ear canal and external ear normal.     Ears:     Comments: TMs are dull    Nose: Congestion and rhinorrhea present.     Mouth/Throat:     Pharynx: Oropharynx is clear. No oropharyngeal exudate or posterior oropharyngeal erythema.     Comments: Clear pnd Eyes:     General:        Right eye: Discharge present.        Left eye:  Discharge present.    Extraocular Movements: Extraocular movements intact.     Pupils: Pupils are equal, round, and reactive to light.     Comments: L eye injected with cloudy discharge R eye mild discharge No swelling  Nl EOM    Cardiovascular:     Rate and Rhythm: Normal rate and regular rhythm.  Pulmonary:     Effort: Pulmonary effort is normal. No respiratory distress.     Breath sounds: No stridor. Wheezing and rhonchi present. No rales.      Comments: Diffusely distant bs Scattered rhonchi   Exp wheeze-mild and throughout    Chest:     Chest wall: No tenderness.  Musculoskeletal:     Cervical back: Normal range of motion and neck supple.  Lymphadenopathy:     Cervical: No cervical adenopathy.  Skin:    General: Skin is warm and dry.     Coloration: Skin is not pale.     Findings: No erythema or rash.     Comments: Dry skin  Some scabs on chest and upper back    Neurological:     Mental Status: She is alert.     Cranial Nerves: No cranial nerve deficit.     Coordination: Coordination normal.  Psychiatric:        Mood and Affect: Mood normal.           Assessment & Plan:   Problem List Items Addressed This Visit       Respiratory   Acute bronchitis with COPD ([place]) - Primary    Former smoker with viral uri (no fever and neg covid testing) Tx with prednisone 40 mg taper  Also augmentin (purulent sputum/sinus pain)  Update if not starting to improve in a week or if worsening  ER precautions noted Albuterol mdi prn  Disc side eff of prednisone      Relevant Medications   predniSONE (DELTASONE) 10 MG tablet   Viral URI with cough    Causing bronchitis Also early sinusitis  Viral conjunctivitis  Sympt care Prednisone 40 mg taper Augmentin  ER precautions Update if not starting to improve in a week or if worsening          Other   Viral conjunctivitis    Left eye  Some erythema and d/c and matting without pain or vision change  Other eye is starting to drain now  Has viral uri and suspect this is cause  Disc use of artificial tears Hygiene/ hands and linens  Get rid of contaminated products and makeup ER precautions  Inst to call if vision change Update if not starting to improve in a week or if worsening        Other Visit Diagnoses     Chest congestion       Relevant Orders   POC COVID-19 (Completed)

## 2022-03-30 NOTE — Assessment & Plan Note (Signed)
Causing bronchitis Also early sinusitis  Viral conjunctivitis  Sympt care Prednisone 40 mg taper Augmentin  ER precautions Update if not starting to improve in a week or if worsening

## 2022-03-30 NOTE — Assessment & Plan Note (Signed)
Left eye  Some erythema and d/c and matting without pain or vision change  Other eye is starting to drain now  Has viral uri and suspect this is cause  Disc use of artificial tears Hygiene/ hands and linens  Get rid of contaminated products and makeup ER precautions  Inst to call if vision change Update if not starting to improve in a week or if worsening

## 2022-04-15 ENCOUNTER — Encounter: Payer: Self-pay | Admitting: Neurology

## 2022-04-15 ENCOUNTER — Ambulatory Visit: Payer: Medicare HMO | Admitting: Neurology

## 2022-04-15 VITALS — BP 132/85 | HR 78 | Ht 60.0 in | Wt 195.5 lb

## 2022-04-15 DIAGNOSIS — R3915 Urgency of urination: Secondary | ICD-10-CM | POA: Diagnosis not present

## 2022-04-15 DIAGNOSIS — G35 Multiple sclerosis: Secondary | ICD-10-CM

## 2022-04-15 DIAGNOSIS — R413 Other amnesia: Secondary | ICD-10-CM | POA: Diagnosis not present

## 2022-04-15 DIAGNOSIS — R5383 Other fatigue: Secondary | ICD-10-CM

## 2022-04-15 MED ORDER — OXYBUTYNIN CHLORIDE ER 10 MG PO TB24: 10.0000 mg | ORAL_TABLET | Freq: Every day | ORAL | 11 refills | Status: DC

## 2022-04-15 MED ORDER — MODAFINIL 200 MG PO TABS: 200.0000 mg | ORAL_TABLET | Freq: Every morning | ORAL | 5 refills | Status: DC

## 2022-04-15 NOTE — Progress Notes (Signed)
[facility]  PATIENT: Karina [name] DOB: April 03, 1955  REFERRING DOCTOR OR PCP: Loura Pardon MD SOURCE: Patient, notes from [facility] neurology, imaging and lab reports, MRI images personally reviewed.  _________________________________   HISTORICAL  CHIEF COMPLAINT:  Chief Complaint  Patient presents with   Room 10    Pt is here Alone. Pt states that things are going ok. Pt states that her left leg is giving her pain. Pt states that she has burning in her feet that goes up her left leg. Pt states that her right eye is blurry and she can barely see out of it.     HISTORY OF PRESENT ILLNESS:  I had the pleasure of seeing Karina [name] at [facility] for her multiple sclerosis.  She is a 67 year old woman who was diagnosed with MS in 1996.  She has not been on any disease modifying therapy since 2012.  MS History: She was diagnosed in 1996 after presenting with syncope.   She had an MRI that was c/w MS and then had a LP.   CSF was reportedly c/w MS.  She saw Dr. Erling Cruz initially and was placed on Betaseron.  She had a lot of knots in her skin so went on Tecfidera aroud 2012 but felt weaker and had reduced BP so it was stopped.   She has not been on a DMT since.  Currently, she reports a lot of issues, some related to her MS.   She tires out as she walks further so feels she could just do 1/2 mile.   She denies weakness in individual muscles but feels muscles tire out quickly.  She has spasms, cramps and myalgias.    She has tingling in her feet when she walks.  She has had reduce vision OD for many years.   She was told the right eye has a ' football shape' (astigmatism?)   She denies difficulty with color vision.   No diplopia.    She has stress incontinence so wears pads.   She has never bene on a medication for her bladder.  She has fatigue.  Mood is fine.   Cognition is mildly impared with soe STm issues, especially names.   She has insomnia, helped  only a bit by gabapentin.     She notes a mild tremor in her hands  She has a lot of phlegm and has had difficulty laying down for the MRI. The "cage" coil made it difficult for her to do and she pulle dout quickly before imageing could be done last 2 times.     REVIEW OF SYSTEMS: Constitutional: No fevers, chills, sweats, or change in appetite Eyes: No visual changes, double vision, eye pain Ear, nose and throat: No hearing loss, ear pain, nasal congestion, sore throat Cardiovascular: No chest pain, palpitations Respiratory:  No shortness of breath at rest or with exertion.   No wheezes GastrointestinaI: No nausea, vomiting, diarrhea, abdominal pain, fecal incontinence Genitourinary:  No dysuria, urinary retention or frequency.  No nocturia. Musculoskeletal:  No neck pain, back pain Integumentary: No rash, pruritus, skin lesions Neurological: as above Psychiatric: No depression at this time.  No anxiety Endocrine: No palpitations, diaphoresis, change in appetite, change in weigh or increased thirst Hematologic/Lymphatic:  No anemia, purpura, petechiae. Allergic/Immunologic: No itchy/runny eyes, nasal congestion, recent allergic reactions, rashes  ALLERGIES: Allergies  Allergen Reactions   Percocet [Oxycodone-Acetaminophen] Shortness Of Breath    Felt like going to pass out and sweating  Tecfidera [Dimethyl Fumarate] Shortness Of Breath, Palpitations, Rash and Cough   Amitriptyline Hcl Itching and Swelling   Atorvastatin Other (See Comments)    elevated LFT's   Betaseron [Interferon Beta-1b] Other (See Comments)    Headache   Clarithromycin Other (See Comments)    reaction not known   Crestor [Rosuvastatin]     Stomach upset    Duloxetine Other (See Comments)    pain   Pregabalin Other (See Comments)    LE swelling   Pseudoephedrine Other (See Comments)    legs hurt   Zoloft [Sertraline Hcl] Other (See Comments)    Headaches    Levofloxacin Rash    HOME  MEDICATIONS:  Current Outpatient Medications:    albuterol (VENTOLIN HFA) 108 (90 Base) MCG/ACT inhaler, INHALE 2 PUFFS UP TO EVERY 4 HOURS AS NEEDED FOR WHEEZING, Disp: 3 each, Rfl: 3   ALPRAZolam (XANAX) 0.5 MG tablet, Take 1 tablet (0.5 mg total) by mouth 2 (two) times daily as needed. For anxiety., Disp: 30 tablet, Rfl: 0   aspirin EC 81 MG tablet, Take 1 tablet (81 mg total) by mouth daily. Swallow whole., Disp: 90 tablet, Rfl: 3   budesonide-formoterol (SYMBICORT) 160-4.5 MCG/ACT inhaler, Inhale 2 puffs into the lungs 2 (two) times daily., Disp: 3 each, Rfl: 2   cholecalciferol (VITAMIN D3) 25 MCG (1000 UNIT) tablet, Take 1,000 Units by mouth daily., Disp: , Rfl:    fluticasone (FLONASE) 50 MCG/ACT nasal spray, Place 1 spray into both nostrils daily., Disp: 16 g, Rfl: 2   gabapentin (NEURONTIN) 300 MG capsule, Take 600 mg by mouth at bedtime., Disp: , Rfl:    ipratropium-albuterol (DUONEB) 0.5-2.5 (3) MG/3ML SOLN, TAKE 3 MLS BY NEBULIZATION EVERY 6 (SIX) HOURS AS NEEDED., Disp: 360 mL, Rfl: 0   modafinil (PROVIGIL) 200 MG tablet, Take 1 tablet (200 mg total) by mouth in the morning., Disp: 30 tablet, Rfl: 5   Multiple Vitamin (MULTIVITAMIN) tablet, Take 1 tablet by mouth daily., Disp: , Rfl:    oxybutynin (DITROPAN XL) 10 MG 24 hr tablet, Take 1 tablet (10 mg total) by mouth at bedtime., Disp: 30 tablet, Rfl: 11   pantoprazole (PROTONIX) 40 MG tablet, Take 40 mg by mouth daily as needed., Disp: , Rfl:    rosuvastatin (CRESTOR) 10 MG tablet, Take 1 tablet (10 mg total) by mouth daily., Disp: 90 tablet, Rfl: 1   Spacer/Aero-Holding Chambers (AEROCHAMBER MV) inhaler, Use as instructed, Disp: 1 each, Rfl: 0   amoxicillin-clavulanate (AUGMENTIN) 875-125 MG tablet, Take 1 tablet by mouth 2 (two) times daily. (Patient not taking: Reported on 04/15/2022), Disp: 14 tablet, Rfl: 0   Blood Glucose Monitoring Suppl (ACCU-CHEK AVIVA PLUS) w/Device KIT, Check blood sugar once daily and as directed Dx R73.09  (Patient not taking: Reported on 04/15/2022), Disp: 1 kit, Rfl: 0   predniSONE (DELTASONE) 10 MG tablet, Take 4 pills once daily by mouth for 3 days, then 3 pills daily for 3 days, then 2 pills daily for 3 days then 1 pill daily for 3 days then stop (Patient not taking: Reported on 04/15/2022), Disp: 30 tablet, Rfl: 0  PAST MEDICAL HISTORY: Past Medical History:  Diagnosis Date   Allergic rhinitis    Allergy    Anxiety    [place] [place], no per pt   Cataract    Colon polyps 08/2008   Adenomatous   COPD (chronic obstructive pulmonary disease) ([place])    Depression    no per pt   Diabetes mellitus  Gastritis 08/2008   H. pylori   GERD (gastroesophageal reflux disease)    Hypercholesteremia    Lung nodule    Menopausal disorder    Multiple sclerosis (HCC)    Neuromuscular disorder (HCC)    Plantar fasciitis    Seizures ([place])    1 seizure in 2001 due to blood sugar dropping to 38, none since   Shingles    Left V1   Urinary incontinence    Vertigo     PAST SURGICAL HISTORY: Past Surgical History:  Procedure Laterality Date   CERVICAL DISCECTOMY  2004   x 2, Anterior; Fusion C4-5, C5-6, C6-7, Dr. Kary Kos   CHEST CT  11/2008   With small 4 mm nodule L lung base (rec re check in 1 year)   CHEST CT  07/2009   Re check chest CT - lung nodule stable   CHOLECYSTECTOMY     COLONOSCOPY  08/2008   Polyps/ re check 5 yrs   CT SINUS LTD W/O CM  02/2009   negative   DILATION AND CURETTAGE OF UTERUS     ESOPHAGOGASTRODUODENOSCOPY  08/2008   Erosive gastritis, h pylori (treated)   FOOT SURGERY     left plantar fascial problem   ROTATOR CUFF REPAIR     right   TONSILLECTOMY     TUBAL LIGATION     UPPER GASTROINTESTINAL ENDOSCOPY      FAMILY HISTORY: Family History  Problem Relation Age of Onset   Migraines Mother    Heart attack Mother    Coronary artery disease Mother    Coronary artery disease Father        6 bypasses    Stroke Father    Diabetes Father     Renal Disease Father    Colon cancer Maternal Grandfather 16   Coronary artery disease Cousin    Coronary artery disease Paternal Grandmother    Lung cancer Paternal Aunt    Multiple sclerosis Neg Hx    Esophageal cancer Neg Hx    Rectal cancer Neg Hx    Stomach cancer Neg Hx    Breast cancer Neg Hx     SOCIAL HISTORY: Social History   Socioeconomic History   Marital status: Married    Spouse name: Not on file   Number of children: 3   Years of education: 9 th   Highest education level: Not on file  Occupational History   Occupation: Disabled     Employer: DISABLED  Tobacco Use   Smoking status: Former    Packs/day: 2.00    Years: 30.00    Total pack years: 60.00    Types: Cigarettes    Quit date: 06/17/2019    Years since quitting: 2.8   Smokeless tobacco: Never  Vaping Use   Vaping Use: Never used  Substance and Sexual Activity   Alcohol use: No    Alcohol/week: 0.0 standard drinks of alcohol   Drug use: No   Sexual activity: Not on file  Other Topics Concern   Not on file  Social History Narrative   Married with 3 children   Daily caffeine use - 2    Disabled.   Education 9th grade    Caffeine one cup of coffee daily.   Patient is right handed.    Social Determinants of Health   Financial Resource Strain: Low Risk  (09/29/2021)   Overall Financial Resource Strain (CARDIA)    Difficulty of Paying Living Expenses: Not hard at all  Food Insecurity: No Food Insecurity (09/29/2021)   Hunger Vital Sign    Worried About Running Out of Food in the Last Year: Never true    Ran Out of Food in the Last Year: Never true  Transportation Needs: No Transportation Needs (09/29/2021)   PRAPARE - Hydrologist (Medical): No    Lack of Transportation (Non-Medical): No  Physical Activity: Inactive (09/29/2021)   Exercise Vital Sign    Days of Exercise per Week: 0 days    Minutes of Exercise per Session: 0 min  Stress: No Stress Concern Present  (09/29/2021)   [place]    Feeling of Stress : Only a little  Social Connections: Moderately Isolated (09/29/2021)   Social Connection and Isolation Panel [NHANES]    Frequency of Communication with Friends and Family: Never    Frequency of Social Gatherings with Friends and Family: Twice a week    Attends Religious Services: More than 4 times per year    Active Member of [employer] or Organizations: No    Attends Archivist Meetings: Never    Marital Status: Married  Human resources officer Violence: Not At Risk (09/29/2021)   Humiliation, Afraid, Rape, and Kick questionnaire    Fear of Current or Ex-Partner: No    Emotionally Abused: No    Physically Abused: No    Sexually Abused: No       PHYSICAL EXAM  Vitals:   04/15/22 1419  BP: 132/85  Pulse: 78  Weight: 195 lb 8 oz (88.7 kg)  Height: 5' (1.524 m)    Body mass index is 38.18 kg/m.   General: The patient is well-developed and well-nourished and in no acute distress  HEENT:  Head is [place]/AT.  Sclera are anicteric.   Neck: No carotid bruits are noted.  The neck is nontender.  Cardiovascular: The heart has a regular rate and rhythm with a normal S1 and S2. There were no murmurs, gallops or rubs.    Skin: Extremities are without rash or  edema.  Musculoskeletal:  Back is nontender  Neurologic Exam  Mental status: The patient is alert and oriented x 3 at the time of the examination. The patient has apparent normal recent and remote memory, with an apparently normal attention span and concentration ability.   Speech is normal.  Cranial nerves: Extraocular movements are full.  Color vision was symmetric.  Facial symmetry is present. There is good facial sensation to soft touch bilaterally.Facial strength is normal.  Trapezius and sternocleidomastoid strength is normal. No dysarthria is noted.  The tongue is midline, and the patient has symmetric elevation  of the soft palate. No obvious hearing deficits are noted.  Motor:  Muscle bulk is normal.   Tone is mildly increased in the legs. Strength is  5 / 5 in all 4 extremities.   Sensory: She reported mildly reduced sensation to vibration in the right knee relative to the left knee.  Coordination: Cerebellar testing reveals good finger-nose-finger and heel-to-shin bilaterally.  Gait and station: Station is normal.   Gait is mildly wide. Tandem gait is moderately wide. Romberg is negative.   Reflexes: Deep tendon reflexes are symmetric and normal in the arms and 3 at the knees.  Plantar responses are flexor.    DIAGNOSTIC DATA (LABS, IMAGING, TESTING) - I reviewed patient records, labs, notes, testing and imaging myself where available.  Lab Results  Component Value Date   WBC 6.2  05/07/2021   HGB 15.1 (H) 05/07/2021   HCT 46.1 (H) 05/07/2021   MCV 95.1 05/07/2021   PLT 193.0 05/07/2021      Component Value Date/Time   NA 139 09/28/2021 1229   NA 136 02/23/2021 1041   NA 136 07/09/2013 1803   K 4.4 09/28/2021 1229   K 4.2 07/09/2013 1803   CL 99 09/28/2021 1229   CL 105 07/09/2013 1803   CO2 31 09/28/2021 1229   CO2 22 07/09/2013 1803   GLUCOSE 80 09/28/2021 1229   GLUCOSE 96 07/09/2013 1803   BUN 15 09/28/2021 1229   BUN 7 (L) 02/23/2021 1041   BUN 16 07/09/2013 1803   CREATININE 1.04 09/28/2021 1229   CREATININE 1.20 (H) 04/20/2019 1444   CALCIUM 9.9 09/28/2021 1229   CALCIUM 9.6 07/09/2013 1803   PROT 7.4 05/07/2021 0847   PROT 6.9 11/21/2014 1137   ALBUMIN 4.2 05/07/2021 0847   ALBUMIN 4.3 11/21/2014 1137   AST 18 05/07/2021 0847   ALT 14 05/07/2021 0847   ALKPHOS 70 05/07/2021 0847   BILITOT 0.3 05/07/2021 0847   BILITOT 0.3 11/21/2014 1137   GFRNONAA >60 08/18/2020 0835   GFRNONAA 55 (L) 07/09/2013 1803   GFRAA >60 08/22/2019 1602   GFRAA >60 07/09/2013 1803   Lab Results  Component Value Date   CHOL 258 (H) 05/07/2021   HDL 69.00 05/07/2021   LDLCALC  173 (H) 05/07/2021   LDLDIRECT 206.5 01/24/2013   TRIG 84.0 05/07/2021   CHOLHDL 4 05/07/2021   Lab Results  Component Value Date   HGBA1C 5.9 05/07/2021   Lab Results  Component Value Date   VITAMINB12 259 05/07/2021   Lab Results  Component Value Date   TSH 2.11 05/07/2021       ASSESSMENT AND PLAN  MULTIPLE SCLEROSIS - Plan: MR BRAIN WO CONTRAST  Other fatigue  Memory loss - Plan: MR BRAIN WO CONTRAST  Urinary urgency   She will remain off of any disease modifying therapy.  MRI of the brain can be checked every 3 years or so to make sure that she is not experiencing breakthrough activity.  If this is occurring she may need to go back on one of the medications.  She is claustrophobic and we will check an MRI of the brain in an open magnet. She has urinary dysfunction.  I will start oxybutynin XL 10 mg daily in the hope that that helps the stress incontinence that is likely due to a combination of MS and gynecologic issues. Fatigue has been one of her bigger problems.  I will start modafinil 200 mg every morning.  If this does not help, consider amantadine 100 mg p.o. twice daily. She will return to see the office in 6 months or sooner if there are new or worsening neurologic symptoms.  42-minute office visit with the majority of the time spent face-to-face for history and physical, discussion/counseling and decision-making.  Additional time with record review and documentation.    Jayla Mackie A. Felecia Shelling, MD, [facility] 04/17/4763, 4:65 PM Certified in Neurology, Clinical Neurophysiology, Sleep Medicine and Neuroimaging  [facility] [address] [place],  [address] [phone]

## 2022-04-21 ENCOUNTER — Telehealth: Payer: Self-pay | Admitting: Neurology

## 2022-04-21 NOTE — Telephone Encounter (Signed)
[employer] medicare Josem [id number] exp. 04/21/22-10/18/22 sent to [place]

## 2022-04-21 NOTE — Telephone Encounter (Signed)
Scheduled 04/30/22 315pm

## 2022-04-28 ENCOUNTER — Telehealth: Payer: Self-pay | Admitting: *Deleted

## 2022-04-28 NOTE — Telephone Encounter (Signed)
Submitted PA modafinil on covermymeds. Key: [id number]. Waiting on determination from [facility].

## 2022-04-28 NOTE — Telephone Encounter (Signed)
Noted  

## 2022-04-28 NOTE — Telephone Encounter (Signed)
Called pt and informed her of message nurse Terrence Dupont left for her. Pt said just forget about it because I'm not paying all that money for that mess. Pt said thanks for calling and have a great day

## 2022-04-28 NOTE — Telephone Encounter (Signed)
PA denied. Only covered for the following diagnosis: narcolepsy, shift work disorder or obstructive sleep apnea.  Please call pt and let her know insurance will not cover modafinil due to her not have above diagnosis. If she still wants to try it, she can fill prescription using a goodrx coupon which will help bring down the cost. She will need to give below info to pharmacy:

## 2022-05-25 ENCOUNTER — Encounter: Payer: Self-pay | Admitting: Family Medicine

## 2022-05-25 ENCOUNTER — Ambulatory Visit (INDEPENDENT_AMBULATORY_CARE_PROVIDER_SITE_OTHER): Payer: Medicare HMO | Admitting: Family Medicine

## 2022-05-25 VITALS — BP 126/70 | HR 66 | Temp 97.2°F | Ht 60.0 in | Wt 196.1 lb

## 2022-05-25 DIAGNOSIS — K219 Gastro-esophageal reflux disease without esophagitis: Secondary | ICD-10-CM | POA: Diagnosis not present

## 2022-05-25 DIAGNOSIS — R42 Dizziness and giddiness: Secondary | ICD-10-CM | POA: Diagnosis not present

## 2022-05-25 DIAGNOSIS — J309 Allergic rhinitis, unspecified: Secondary | ICD-10-CM | POA: Diagnosis not present

## 2022-05-25 MED ORDER — FLUTICASONE PROPIONATE 50 MCG/ACT NA SUSP: 2.0000 | Freq: Every day | NASAL | 11 refills | Status: AC

## 2022-05-25 MED ORDER — OMEPRAZOLE 20 MG PO CPDR: 20.0000 mg | DELAYED_RELEASE_CAPSULE | Freq: Every day | ORAL | 3 refills | Status: DC

## 2022-05-25 MED ORDER — FAMOTIDINE 20 MG PO TABS: 20.0000 mg | ORAL_TABLET | Freq: Two times a day (BID) | ORAL | 1 refills | Status: DC

## 2022-05-25 NOTE — Assessment & Plan Note (Signed)
This is new w/o headache  Intermittent/positional   pt describes as light headed more than spinning  Some sinus congestion  Some inc in GERD symptoms  ? If part of her MS  Planning mri (per neuro)--will work on better GERD control so she can tolerate this test  Reassuring exam Inst to hydrate  Use flonase daily  F/u with neuro and for MRI  Update if not starting to improve in a week or if worsening

## 2022-05-25 NOTE — Assessment & Plan Note (Signed)
Refilled flonase to use daily in season Some nasal congestion may add to her positional dizziness

## 2022-05-25 NOTE — Assessment & Plan Note (Signed)
Worse lately  Globus sensation and acid in throat  This has kept her from scheduling her mri so far  Will d/c protonix (did not help) Sent omeprazole 20 mg daily (helps more/ take in am Added pepcid 20 mg bid Disc diet  F/u 2 mo

## 2022-05-25 NOTE — Patient Instructions (Addendum)
I want to get your acid reflux in better control so you can do the MRI   Take omeprazole 20 mg daily in the am 30 minutes before food or medicine  Take pepcid twice daily with meals   These are both medicines for acid  If not helpful let us know  If any side effects hold the medicine and let us know  Follow up with me in 2 months for the acid reflux    Your exam is reassuring  ? If MS is causing dizziness or it could be sinus related  Continue flonase 2 sprays in each nostril daily in allergy season    MRI is the next step in figuring out the dizziness   In the meantime - change position slowly (getting up or down or even rolling over in bed)   Blood pressure is ok today

## 2022-05-25 NOTE — Progress Notes (Signed)
Subjective:    Patient ID: Karina [name], female    DOB: March 01, 1956, 67 y.o.   MRN: [id number]  HPI Pt presents for dizziness for 2 weeks  Wt Readings from Last 3 Encounters:  05/25/22 196 lb 2 oz (89 kg)  04/15/22 195 lb 8 oz (88.7 kg)  03/30/22 192 lb 4 oz (87.2 kg)   38.30 kg/m  Vitals:   05/25/22 1116  BP: 126/70  Pulse: 66  Temp: (!) 97.2 F (36.2 C)  SpO2: 99%    Started lying on couch  Occurs with position change (then gets better when she stays in one position)  Not a spinning feeling (more light headed)  Does make her nauseated    Fell from the dizziness/ last week  Hit arm on table- bruise on R arm  No head injury   She did bump head coming up under a cabinet - but that was after the dizziness started   MS has never caused dizziness for her   No headaches lately   Some nasal stuffiness due to pollen  Causes mucous in throat  Ears are wet in the am  Feel full at times / does use q tip  They do ring some / buzzine    Saw neuro for MS in February Dr Felecia Shelling Was px modafinil 200 mg daily for fatigue    (if not imp consider amantadine) Oxyburinin xl 10 mg for ureinary incontinence   Was panning f/u MRI = has not had it yet  Hesitant because she cannot cough during the mri  She spits mucous from her throat all the time but tastes loke acid also   Takes gabapentin 600 mg at night  That is not new  Breathing is pretty good   Protonix did not work for GERD so she does not take it  Tried omeprazole otc and it worked a bit better  Has chronic epigastric pain   Patient Active Problem List   Diagnosis Date Noted   Viral conjunctivitis 03/30/2022   Left shoulder pain 08/14/2021   Occult blood positive stool 05/25/2021   Colon cancer screening 05/07/2021   Estrogen deficiency 05/07/2021   Acute bronchitis with COPD ([place]) 03/31/2021   Rheumatoid arthritis ([place]) 03/31/2021   Coronary artery calcification 03/31/2021   Hypokalemia 08/27/2020    Joint pain in both hands 10/12/2019   Head pain 08/22/2019   Elevated serum creatinine 04/24/2019   Frequent urination 04/20/2019   Decreased appetite 04/20/2019   Encounter for screening mammogram for breast cancer 04/20/2019   Seborrheic keratosis, inflamed 12/15/2017   B12 deficiency 08/24/2017   Left low back pain 08/24/2017   Erosive osteoarthritis of both hands 09/09/2016   Fatigue 09/07/2016   Bilateral hand pain 09/07/2016   Routine general medical examination at a health care facility 05/06/2015   Large breasts 05/06/2015   Lumbar disc disease 11/01/2014   Knee pain, bilateral 03/11/2014   Thoracic back pain 11/30/2013   Encounter for routine gynecological examination 01/31/2013   Encounter for Medicare annual wellness exam 01/23/2013   Chest pain 09/20/2012   Dyspnea 09/13/2012   Asthmatic bronchitis , chronic 12/06/2011   Chronic cough 11/22/2011   Anxiety disorder 07/17/2009   HEMORRHOIDS-EXTERNAL 11/07/2008   IRRITABLE BOWEL SYNDROME 11/07/2008   Lung nodule 10/14/2008   Prediabetes 10/14/2008   PERSONAL HX COLONIC POLYPS 10/08/2008   Vitamin D deficiency 07/23/2008   SYNCOPE, HX OF 06/01/2008   FOOT SURGERY, HX OF 06/01/2008   DILATION AND CURETTAGE, HX  OF 06/01/2008   OSTEOARTHRITIS, SHOULDER, RIGHT 03/22/2008   ROTATOR CUFF SYNDROME, RIGHT 03/22/2008   ARTHRALGIA 12/12/2007   Allergic rhinitis 08/01/2007   Current smoker 10/17/2006   Peripheral neuropathy 10/17/2006   FOOT PAIN, BILATERAL 10/17/2006   HYPOGLYCEMIA 09/30/2006   HYPERCHOLESTEROLEMIA 09/30/2006   MULTIPLE SCLEROSIS 09/30/2006   GERD 09/30/2006   FIBROCYSTIC BREAST DISEASE 09/30/2006   Dizziness 09/30/2006   Past Medical History:  Diagnosis Date   Allergic rhinitis    Allergy    Anxiety    [place]- [facility] Hocking, no per pt   Cataract    Colon polyps 08/2008   Adenomatous   COPD (chronic obstructive pulmonary disease) ([place])    Depression    no per pt   Diabetes mellitus     Gastritis 08/2008   H. pylori   GERD (gastroesophageal reflux disease)    Hypercholesteremia    Lung nodule    Menopausal disorder    Multiple sclerosis ([facility])    Neuromuscular disorder ([facility])    Plantar fasciitis    Seizures ([place])    1 seizure in 2001 due to blood sugar dropping to 38, none since   Shingles    Left V1   Urinary incontinence    Vertigo    Past Surgical History:  Procedure Laterality Date   CERVICAL DISCECTOMY  2004   x 2, Anterior; Fusion C4-5, C5-6, C6-7, Dr. Kary Kos   CHEST CT  11/2008   With small 4 mm nodule L lung base (rec re check in 1 year)   CHEST CT  07/2009   Re check chest CT - lung nodule stable   CHOLECYSTECTOMY     COLONOSCOPY  08/2008   Polyps/ re check 5 yrs   CT SINUS LTD W/O CM  02/2009   negative   DILATION AND CURETTAGE OF UTERUS     ESOPHAGOGASTRODUODENOSCOPY  08/2008   Erosive gastritis, h pylori (treated)   FOOT SURGERY     left plantar fascial problem   ROTATOR CUFF REPAIR     right   TONSILLECTOMY     TUBAL LIGATION     UPPER GASTROINTESTINAL ENDOSCOPY     Social History   Tobacco Use   Smoking status: Former    Packs/day: 2.00    Years: 30.00    Additional pack years: 0.00    Total pack years: 60.00    Types: Cigarettes    Quit date: 06/17/2019    Years since quitting: 2.9   Smokeless tobacco: Never  Vaping Use   Vaping Use: Never used  Substance Use Topics   Alcohol use: No    Alcohol/week: 0.0 standard drinks of alcohol   Drug use: No   Family History  Problem Relation Age of Onset   Migraines Mother    Heart attack Mother    Coronary artery disease Mother    Coronary artery disease Father        6 bypasses    Stroke Father    Diabetes Father    Renal Disease Father    Colon cancer Maternal Grandfather 18   Coronary artery disease Cousin    Coronary artery disease Paternal Grandmother    Lung cancer Paternal Aunt    Multiple sclerosis Neg Hx    Esophageal cancer Neg Hx    Rectal cancer Neg Hx     Stomach cancer Neg Hx    Breast cancer Neg Hx    Allergies  Allergen Reactions   Percocet [Oxycodone-Acetaminophen] Shortness Of Breath  Felt like going to pass out and sweating   Tecfidera [Dimethyl Fumarate] Shortness Of Breath, Palpitations, Rash and Cough   Amitriptyline Hcl Itching and Swelling   Atorvastatin Other (See Comments)    elevated LFT's   Betaseron [Interferon Beta-1b] Other (See Comments)    Headache   Clarithromycin Other (See Comments)    reaction not known   Crestor [Rosuvastatin]     Stomach upset    Duloxetine Other (See Comments)    pain   Pregabalin Other (See Comments)    LE swelling   Pseudoephedrine Other (See Comments)    legs hurt   Zoloft [Sertraline Hcl] Other (See Comments)    Headaches    Levofloxacin Rash   Current Outpatient Medications on File Prior to Visit  Medication Sig Dispense Refill   albuterol (VENTOLIN HFA) 108 (90 Base) MCG/ACT inhaler INHALE 2 PUFFS UP TO EVERY 4 HOURS AS NEEDED FOR WHEEZING 3 each 3   ALPRAZolam (XANAX) 0.5 MG tablet Take 1 tablet (0.5 mg total) by mouth 2 (two) times daily as needed. For anxiety. 30 tablet 0   aspirin EC 81 MG tablet Take 1 tablet (81 mg total) by mouth daily. Swallow whole. 90 tablet 3   budesonide-formoterol (SYMBICORT) 160-4.5 MCG/ACT inhaler Inhale 2 puffs into the lungs 2 (two) times daily. 3 each 2   cholecalciferol (VITAMIN D3) 25 MCG (1000 UNIT) tablet Take 1,000 Units by mouth daily.     gabapentin (NEURONTIN) 300 MG capsule Take 600 mg by mouth at bedtime.     ipratropium-albuterol (DUONEB) 0.5-2.5 (3) MG/3ML SOLN TAKE 3 MLS BY NEBULIZATION EVERY 6 (SIX) HOURS AS NEEDED. 360 mL 0   Multiple Vitamin (MULTIVITAMIN) tablet Take 1 tablet by mouth daily.     rosuvastatin (CRESTOR) 10 MG tablet Take 1 tablet (10 mg total) by mouth daily. 90 tablet 1   Spacer/Aero-Holding Chambers (AEROCHAMBER MV) inhaler Use as instructed 1 each 0   No current facility-administered medications on file  prior to visit.    Review of Systems  Constitutional:  Negative for activity change, appetite change, fatigue, fever and unexpected weight change.  HENT:  Positive for congestion, sore throat and voice change. Negative for ear discharge, ear pain, rhinorrhea, sinus pressure and trouble swallowing.        Globus sensation  Acid in throat  Eyes:  Positive for visual disturbance. Negative for pain and redness.  Respiratory:  Negative for cough, shortness of breath and wheezing.   Cardiovascular:  Negative for chest pain and palpitations.  Gastrointestinal:  Positive for abdominal pain and nausea. Negative for abdominal distention, anal bleeding, blood in stool, constipation, diarrhea, rectal pain and vomiting.       Acid reflux    Endocrine: Negative for polydipsia and polyuria.  Genitourinary:  Negative for dysuria, frequency and urgency.  Musculoskeletal:  Positive for back pain. Negative for arthralgias and myalgias.  Skin:  Negative for pallor and rash.  Allergic/Immunologic: Negative for environmental allergies.  Neurological:  Positive for dizziness, weakness and light-headedness. Negative for syncope, facial asymmetry, numbness and headaches.       Baseline weakness and vision problems from MS  Hematological:  Negative for adenopathy. Does not bruise/bleed easily.  Psychiatric/Behavioral:  Negative for decreased concentration and dysphoric mood. The patient is nervous/anxious.        Objective:   Physical Exam Constitutional:      General: She is not in acute distress.    Appearance: Normal appearance. She is well-developed. She is obese.  She is not ill-appearing or diaphoretic.  HENT:     Head: Normocephalic and atraumatic.     Right Ear: External ear normal.     Left Ear: External ear normal.     Nose: Nose normal.     Mouth/Throat:     Pharynx: No oropharyngeal exudate.  Eyes:     General: No scleral icterus.       Right eye: No discharge.        Left eye: No discharge.      Conjunctiva/sclera: Conjunctivae normal.     Pupils: Pupils are equal, round, and reactive to light.     Comments: No nystagmus  Neck:     Thyroid: No thyromegaly.     Vascular: No carotid bruit or JVD.     Trachea: No tracheal deviation.  Cardiovascular:     Rate and Rhythm: Normal rate and regular rhythm.     Heart sounds: Normal heart sounds. No murmur heard. Pulmonary:     Effort: Pulmonary effort is normal. No respiratory distress.     Breath sounds: Normal breath sounds. No stridor. No wheezing, rhonchi or rales.     Comments: Diffusely distant bs  Chest:     Chest wall: No tenderness.  Abdominal:     General: Bowel sounds are normal. There is no distension.     Palpations: Abdomen is soft. There is no hepatomegaly, splenomegaly, mass or pulsatile mass.     Tenderness: There is abdominal tenderness in the epigastric area. There is no guarding or rebound.     Hernia: No hernia is present.  Musculoskeletal:        General: No tenderness.     Cervical back: Full passive range of motion without pain, normal range of motion and neck supple.  Lymphadenopathy:     Cervical: No cervical adenopathy.  Skin:    General: Skin is warm and dry.     Coloration: Skin is not pale.     Findings: No rash.  Neurological:     Mental Status: She is alert and oriented to person, place, and time. Mental status is at baseline.     Cranial Nerves: No cranial nerve deficit.     Sensory: Sensation is intact. No sensory deficit.     Motor: No weakness, tremor, atrophy, abnormal muscle tone or pronator drift.     Coordination: Romberg sign negative. Coordination normal. Finger-Nose-Finger Test normal.     Gait: Gait normal.     Deep Tendon Reflexes: Reflexes are normal and symmetric.     Comments: No focal cerebellar signs   Psychiatric:        Behavior: Behavior normal.        Thought Content: Thought content normal.           Assessment & Plan:   Problem List Items Addressed This  Visit       Respiratory   Allergic rhinitis    Refilled flonase to use daily in season Some nasal congestion may add to her positional dizziness        Digestive   GERD    Worse lately  Globus sensation and acid in throat  This has kept her from scheduling her mri so far  Will d/c protonix (did not help) Sent omeprazole 20 mg daily (helps more/ take in am Added pepcid 20 mg bid Disc diet  F/u 2 mo      Relevant Medications   omeprazole (PRILOSEC) 20 MG capsule   famotidine (PEPCID) 20  MG tablet     Other   Dizziness - Primary    This is new w/o headache  Intermittent/positional   pt describes as light headed more than spinning  Some sinus congestion  Some inc in GERD symptoms  ? If part of her MS  Planning mri (per neuro)--will work on better GERD control so she can tolerate this test  Reassuring exam Inst to hydrate  Use flonase daily  F/u with neuro and for MRI  Update if not starting to improve in a week or if worsening

## 2022-06-03 ENCOUNTER — Telehealth: Payer: Self-pay

## 2022-06-03 NOTE — Progress Notes (Signed)
Care Management [facility] Coordination Services Pharmacy Team  Reason for Encounter: Appointment Reminder  Contacted patient to confirm telephone appointment with Charlene Brooke , PharmD on 06/08/22 at 3:00. Spoke with patient on 06/03/2022   Do you have any problems getting your medications? No  What is your top health concern you would like to discuss at your upcoming visit? Patient reports continued sinus drainage that is giving her acid reflux and famotidine and omeprazole not helping  Have you seen any other providers since your last visit with PCP? No    Hospital visits:  None in previous 6 months   Star Rating Drugs:  Medication:  Last Fill: [facility] Supply Rosuvastatin 20mg  01/2022 90    Per [facility] this was 20mg                                                                             Per  Patient takes 1/2 tab.   Care Gaps: Annual wellness visit in last year? Yes  Charlene Brooke, PharmD notified  Karina [name], [place] Assistant [phone]

## 2022-06-08 ENCOUNTER — Telehealth: Payer: Self-pay | Admitting: Pharmacist

## 2022-06-08 ENCOUNTER — Ambulatory Visit: Payer: Medicare HMO | Admitting: Pharmacist

## 2022-06-08 ENCOUNTER — Encounter: Payer: Self-pay | Admitting: Pharmacist

## 2022-06-08 NOTE — Telephone Encounter (Signed)
Will do that- if she wants her labs checked at that visit ask her to fast for 4 hours prior  Thanks

## 2022-06-08 NOTE — Telephone Encounter (Signed)
Patient reports compliance with rosuvastatin 10 mg daily, she previously did not tolerate higher dose or alternate statins (pravastatin, atorvastatin). She is due for repeat lipid panel and LFTs.   She has appt upcoming 07/26/22 with PCP, consider checking labs same day. Labs are already ordered in chart from November (A1c, lipids, AST, ALT).

## 2022-06-08 NOTE — Progress Notes (Signed)
Care Management & Coordination Services Pharmacy Note  06/08/2022 Name:  Karina [name] MRN:  [id number] DOB:  [birth year]  Summary: F/U visit -HLD: LDL 173 (05/2021 - off statin therapy); pt reports compliance with rosuvastatin 10 mg/day, she denies side effects with this dose -Pt reports ongoing issues with sinus drainage/coughing/globus sensation, she does not think related to lungs/COPD; she says omeprazole has helped with reflux but not with this; she is not using inhalers regularly -Pt has not had MRI (dizziness, headaches, hx MS) due to inability to lie flat d/t continued coughing   Recommendations/Changes made from today's visit: -Recommend repeat lipid panel, LFTs at next OV -Advised to try taking Symbicort BID x [redacted] weeks along with Flonase and OTC antihistamine (Claritin, Zyrtec, or Allegra) to see if helpful for sinus/coughing issues  Follow up plan: -Pharmacist follow up PRN -PCP appt 07/26/22; Neurology appt 11/04/22    Subjective: Karina [name] is an 67 y.o. year old female who is a primary patient of [name], Wynelle Fanny, MD.  The care coordination team was consulted for assistance with disease management and care coordination needs.    Engaged with patient by telephone for follow up visit.  Recent office visits: 05/25/22 Dr Glori Bickers OV: f/u dizziness - planning MRI. GERD- rx omeprazole, famotidine. D/c modafinil (not covered), oxybutynin (pt preference), pantoprazole (pt preference).   Recent consult visits: 04/15/22 Dr Felecia Shelling (Neurology): MS - ordered MRI (open). Start oxybutynin for OAB. Start Modafinil for fatigue, consider amantadine if no help. RTC 6 months.  Hospital visits: None in previous 6 months   Objective:  Lab Results  Component Value Date   CREATININE 1.04 09/28/2021   BUN 15 09/28/2021   GFR 57.13 (L) 09/28/2021   EGFR 61 02/23/2021   GFRNONAA >60 08/18/2020   GFRAA >60 08/22/2019   NA 139 09/28/2021   K 4.4 09/28/2021   CALCIUM 9.9 09/28/2021   CO2 31  09/28/2021   GLUCOSE 80 09/28/2021    Lab Results  Component Value Date/Time   HGBA1C 5.9 05/07/2021 08:47 AM   HGBA1C 5.4 04/20/2019 02:44 PM   GFR 57.13 (L) 09/28/2021 12:29 PM   GFR 52.98 (L) 05/07/2021 08:47 AM    Last diabetic Eye exam: No results found for: "HMDIABEYEEXA"  Last diabetic Foot exam: No results found for: "HMDIABFOOTEX"   Lab Results  Component Value Date   CHOL 258 (H) 05/07/2021   HDL 69.00 05/07/2021   LDLCALC 173 (H) 05/07/2021   LDLDIRECT 206.5 01/24/2013   TRIG 84.0 05/07/2021   CHOLHDL 4 05/07/2021       Latest Ref Rng & Units 05/07/2021    8:47 AM 08/18/2020    8:35 AM 08/15/2020    8:22 AM  Hepatic Function  Total Protein 6.0 - 8.3 g/dL 7.4  6.7  7.1   Albumin 3.5 - 5.2 g/dL 4.2  2.4  3.2   AST 0 - 37 U/L 18  37  20   ALT 0 - 35 U/L 14  30  12    Alk Phosphatase 39 - 117 U/L 70  66  57   Total Bilirubin 0.2 - 1.2 mg/dL 0.3  0.6  0.6     Lab Results  Component Value Date/Time   TSH 2.11 05/07/2021 08:47 AM   TSH 1.66 04/20/2019 02:44 PM   FREET4 0.93 08/11/2017 04:44 PM       Latest Ref Rng & Units 05/07/2021    8:47 AM 08/27/2020   12:01 PM 08/18/2020    8:35  AM  CBC  WBC 4.0 - 10.5 K/uL 6.2  7.7  11.7   Hemoglobin 12.0 - 15.0 g/dL 15.1  12.4  12.1   Hematocrit 36.0 - 46.0 % 46.1  36.9  35.8   Platelets 150.0 - 400.0 K/uL 193.0  361.0  283     Lab Results  Component Value Date/Time   VD25OH 45.27 05/07/2021 08:47 AM   VD25OH 77.69 07/24/2019 11:44 AM   VITAMINB12 259 05/07/2021 08:47 AM   VITAMINB12 376 04/20/2019 02:44 PM    Clinical ASCVD: Yes - coronary calcification The 10-year ASCVD risk score (Arnett DK, et al., 2019) is: 9.5%   Values used to calculate the score:     Age: 67 years     Sex: Female     Is Non-Hispanic African American: No     Diabetic: No     Tobacco smoker: Yes     Systolic Blood Pressure: 123XX123 mmHg     Is BP treated: No     HDL Cholesterol: 69 mg/dL     Total Cholesterol: 258 mg/dL         03/30/2022   12:19 PM 09/29/2021    1:03 PM 08/14/2021   11:36 AM  Depression screen PHQ 2/9  Decreased Interest 3 0 0  Down, Depressed, Hopeless 3 0 0  PHQ - 2 Score 6 0 0  Altered sleeping 2 0   Tired, decreased energy 3 0   Change in appetite 0 0   Feeling bad or failure about yourself  0 0   Trouble concentrating 0 0   Moving slowly or fidgety/restless 1 0   Suicidal thoughts 0 0   PHQ-9 Score 12 0   Difficult doing work/chores Extremely dIfficult Not difficult at all        03/30/2022   12:19 PM  GAD 7 : Generalized Anxiety Score  Nervous, Anxious, on Edge 1  Control/stop worrying 2  Worry too much - different things 2  Trouble relaxing 1  Restless 0  Easily annoyed or irritable 3  Afraid - awful might happen 1  Total GAD 7 Score 10  Anxiety Difficulty Very difficult     Social History   Tobacco Use  Smoking Status Former   Packs/day: 2.00   Years: 30.00   Additional pack years: 0.00   Total pack years: 60.00   Types: Cigarettes   Quit date: 06/17/2019   Years since quitting: 2.9  Smokeless Tobacco Never   BP Readings from Last 3 Encounters:  05/25/22 126/70  04/15/22 132/85  03/30/22 126/68   Pulse Readings from Last 3 Encounters:  05/25/22 66  04/15/22 78  03/30/22 80   Wt Readings from Last 3 Encounters:  05/25/22 196 lb 2 oz (89 kg)  04/15/22 195 lb 8 oz (88.7 kg)  03/30/22 192 lb 4 oz (87.2 kg)   BMI Readings from Last 3 Encounters:  05/25/22 38.30 kg/m  04/15/22 38.18 kg/m  03/30/22 37.55 kg/m    Allergies  Allergen Reactions   Atorvastatin Other (See Comments)    elevated LFT's   Percocet [Oxycodone-Acetaminophen] Shortness Of Breath    Felt like going to pass out and sweating   Tecfidera [Dimethyl Fumarate] Shortness Of Breath, Palpitations, Rash and Cough   Amitriptyline Hcl Itching and Swelling   Betaseron [Interferon Beta-1b] Other (See Comments)    Headache   Duloxetine Other (See Comments)    pain   Pregabalin Other (See  Comments)    LE swelling  Zoloft [Sertraline Hcl] Other (See Comments)    Headaches    Clarithromycin Other (See Comments)    reaction not known   Levofloxacin Rash   Pseudoephedrine Other (See Comments)    legs hurt    Medications Reviewed Today     Reviewed by Charlton Haws, [facility] (Pharmacist) on 06/08/22 at 1548  Med List Status: <None>   Medication Order Taking? Sig Documenting Provider Last Dose Status Informant  albuterol (VENTOLIN HFA) 108 (90 Base) MCG/ACT inhaler [id number] Yes INHALE 2 PUFFS UP TO EVERY 4 HOURS AS NEEDED FOR WHEEZING [name], Wynelle Fanny, MD Taking Active   ALPRAZolam Duanne Moron) 0.5 MG tablet [id number] Yes Take 1 tablet (0.5 mg total) by mouth 2 (two) times daily as needed. For anxiety. [name], Wynelle Fanny, MD Taking Active Self  aspirin EC 81 MG tablet [id number] Yes Take 1 tablet (81 mg total) by mouth daily. Swallow whole. Minna Merritts, MD Taking Active   budesonide-formoterol [facility]) 160-4.5 MCG/ACT inhaler [id number] Yes Inhale 2 puffs into the lungs 2 (two) times daily. Chesley Mires, MD Taking Active            Med Note Charlton Haws   Tue Jun 08, 2022  3:48 PM) Uses PRN  cholecalciferol (VITAMIN D3) 25 MCG (1000 UNIT) tablet [id number] Yes Take 1,000 Units by mouth daily. [provider] Taking Active   famotidine (PEPCID) 20 MG tablet [id number] Yes Take 1 tablet (20 mg total) by mouth 2 (two) times daily. [name], Wynelle Fanny, MD Taking Active   fluticasone [facility]) 50 MCG/ACT nasal spray [id number] Yes Place 2 sprays into both nostrils daily. [name], Wynelle Fanny, MD Taking Active   gabapentin (NEURONTIN) 300 MG capsule [id number] Yes Take 600 mg by mouth at bedtime. [provider] Taking Active   ipratropium-albuterol (DUONEB) 0.5-2.5 (3) MG/3ML SOLN [id number] Yes TAKE 3 MLS BY NEBULIZATION EVERY 6 (SIX) HOURS AS NEEDED. Laverle Hobby, MD Taking Active Self  Multiple Vitamin (MULTIVITAMIN) tablet [id number] Yes Take 1 tablet by mouth  daily. [provider] Taking Active   omeprazole (PRILOSEC) 20 MG capsule [id number] Yes Take 1 capsule (20 mg total) by mouth daily. [name], Wynelle Fanny, MD Taking Active   rosuvastatin (CRESTOR) 10 MG tablet [id number] Yes Take 1 tablet (10 mg total) by mouth daily. [name], Wynelle Fanny, MD Taking Active   Spacer/Aero-Holding Chambers (AEROCHAMBER MV) inhaler [id number] Yes Use as instructed Chesley Mires, MD Taking Active             SDOH:  (Social Determinants of Health) assessments and interventions performed: No SDOH Interventions    Flowsheet Row Clinical Support from 09/29/2021 in [place] at [place] [facility] from 06/10/2021 in [place] at [place] from 10/17/2019 in [place] at [place] Interventions Intervention Not Indicated Intervention Not Indicated --  Housing Interventions Intervention Not Indicated -- --  Transportation Interventions Intervention Not Indicated -- --  Depression Interventions/Treatment  -- -- [id number] Score <4 Follow-up Not Indicated  Financial Strain Interventions Intervention Not Indicated Intervention Not Indicated --  Physical Activity Interventions Patient Refused -- --  Stress Interventions Intervention Not Indicated -- --  Social Connections Interventions Intervention Not Indicated -- --       Medication Assistance: None required.  Patient affirms current coverage meets needs.  Medication Access: Within the past 30 days, how often has patient missed a dose of medication? 0  Is a pillbox or other method used to improve adherence? No  Factors that may affect medication adherence? lack of understanding of disease management Are meds synced by current pharmacy? No  Are meds delivered by current pharmacy? No  Does patient experience delays in picking up medications due to transportation concerns? No    Upstream Services Reviewed: Is patient disadvantaged to use [facility]?: No  Current Rx insurance plan: [employer] Name and location of Current pharmacy:  [id number] - [place], [place] [address] [place] [address] Phone: [phone] Fax: [phone]  [facility] services reviewed with patient today?: No  Patient requests to transfer care to [facility] Pharmacy?: No  Reason patient declined to change pharmacies: Disadvantaged due to insurance/mail order  Compliance/Adherence/Medication fill history: Care Gaps: Lung cancer screening (due 02/2022)  Star-Rating Drugs: Rosuvastatin 10 mg - PDC 0% inaccurate; LF 01/2022 x 6 month supply (20 mg - pt cuts in half)   ASSESSMENT / PLAN  Hyperlipidemia: (LDL goal < 70) -Uncontrolled - LDL 173 (05/2021) while pt was off statin therapy; previously 119; pt reports she is tolerating rosuvastatin 10 mg now -Aortic atherosclerosis on CT 02/2021 -Current treatment: Rosuvastatin 10 mg daily - Appropriate, Query Effective Aspirin 81 mg daily - Appropriate, Effective, Safe, Accessible -Medications previously tried: atorvastatin (elevated LFTs), pravastatin 40 mg (GI upset), rosuvastatin 40 mg (GI upset) -Educated on Cholesterol goals; Benefits of statin for ASCVD risk reduction; -Recommend to continue current medication; repeat lipid panel at next OV  COPD (Goal: control symptoms and prevent exacerbations) -Query Controlled - pt reports ongoing sinus drainaige, coughing, globus sensation; she does not think symptoms are related to her lungs/COPD; she does not use inhalers regularly -pt reports she quit smoking 05/2021 -Gold Grade: Gold 2 (FEV1 50-79%) -Current COPD Classification:  A (low sx, <2 exacerbations/yr) -Pulmonary function testing: 01/2021 - FEV1 0.64, ratio 0.42 -Exacerbations requiring treatment in last 6 months: 0 -Current treatment  Albuterol HFA- Appropriate, Effective, Safe,  Accessible Symbicort 160-4.5 mcg/act 2 puff BID - Appropriate, Query Effective Duoneb - rare use Flonase PRN - Appropriate, Effective, Safe, Accessible -Medications previously tried: Librarian, academic  -Patient reports consistent use of maintenance inhaler -Frequency of rescue inhaler use: daily -Counseled Benefits of consistent maintenance inhaler use -Advised to try taking Symbicort BID x [redacted] weeks along with Flonase and OTC antihistamine (Claritin, Zyrtec, or Allegra) to see if helpful for sinus/coughing issues  Pain (Goal: manage pain) -Controlled -Hx MS, peripheral neuropathy, lumbar disc disease, RA (apparently inactive) -Current treatment  Gabapentin 300 mg - 1 AM, 1 PM, 2 HS - Appropriate, Effective, Safe, Accessible -Medications previously tried: duloxetine, pregabalin (swelling) -Recommended to continue current medication  GERD (Goal: manage symptoms) -Controlled - pt reports reflux symptoms improved with current regimen -Current treatment  Omeprazole 20 mg daily Appropriate, Effective, Safe, Accessible Famotidine 20 mg daily Appropriate, Effective, Safe, Accessible -Medications previously tried: n/a  -Recommended to continue current medication  Multiple Sclerosis -Pt was supposed to have MRI brain in August (neck pain/headache, numbness w/ history of MS) but could not due MRI due to inability to lie flat, pt says it was because she could not have "cage on my face" due to breathing issues with COPD --MRI order sent to [facility] 12/2021, 04/2022. Scheduled 04/30/22 but pt apparently cancelled due to ongoing sinus/coughing issues  Health Maintenance -Vaccine gaps: none -Hx MS, follows with neurology. Off DMARD since 2015.    Charlene Brooke, PharmD, BCACP, CPP Clinical Pharmacist Practitioner [facility]  at [place]

## 2022-06-08 NOTE — Patient Instructions (Signed)
Visit Information  Phone number for Pharmacist: [phone]  Thank you for meeting with me to discuss your medications! Below is a summary of what we talked about during the visit:   Recommendations/Changes made from today's visit: -Recommend repeat lipid panel, LFTs at next [place] to try taking Symbicort BID x [redacted] weeks along with Flonase and OTC antihistamine (Claritin, Zyrtec, or Allegra) to see if helpful for sinus/coughing issues  Follow up plan: -Pharmacist follow up PRN -PCP appt 07/26/22; Neurology appt 11/04/22   Charlene Brooke, PharmD, BCACP Clinical Pharmacist [facility] [phone]

## 2022-06-10 NOTE — Telephone Encounter (Signed)
Pt notified to fast x4 hrs prior to appt in May

## 2022-06-16 ENCOUNTER — Other Ambulatory Visit: Payer: Self-pay | Admitting: Family Medicine

## 2022-07-26 ENCOUNTER — Encounter: Payer: Self-pay | Admitting: Family Medicine

## 2022-07-26 ENCOUNTER — Ambulatory Visit (INDEPENDENT_AMBULATORY_CARE_PROVIDER_SITE_OTHER): Payer: Medicare HMO | Admitting: Family Medicine

## 2022-07-26 ENCOUNTER — Telehealth: Payer: Self-pay | Admitting: *Deleted

## 2022-07-26 VITALS — BP 126/82 | HR 72 | Temp 97.6°F | Ht 60.0 in | Wt 193.1 lb

## 2022-07-26 DIAGNOSIS — R109 Unspecified abdominal pain: Secondary | ICD-10-CM

## 2022-07-26 DIAGNOSIS — R5382 Chronic fatigue, unspecified: Secondary | ICD-10-CM

## 2022-07-26 DIAGNOSIS — R7303 Prediabetes: Secondary | ICD-10-CM

## 2022-07-26 DIAGNOSIS — L989 Disorder of the skin and subcutaneous tissue, unspecified: Secondary | ICD-10-CM

## 2022-07-26 DIAGNOSIS — M069 Rheumatoid arthritis, unspecified: Secondary | ICD-10-CM

## 2022-07-26 DIAGNOSIS — R079 Chest pain, unspecified: Secondary | ICD-10-CM

## 2022-07-26 DIAGNOSIS — J4489 Other specified chronic obstructive pulmonary disease: Secondary | ICD-10-CM

## 2022-07-26 DIAGNOSIS — R09A2 Foreign body sensation, throat: Secondary | ICD-10-CM | POA: Insufficient documentation

## 2022-07-26 DIAGNOSIS — E7800 Pure hypercholesterolemia, unspecified: Secondary | ICD-10-CM | POA: Diagnosis not present

## 2022-07-26 DIAGNOSIS — E559 Vitamin D deficiency, unspecified: Secondary | ICD-10-CM

## 2022-07-26 DIAGNOSIS — R233 Spontaneous ecchymoses: Secondary | ICD-10-CM | POA: Diagnosis not present

## 2022-07-26 LAB — COMPREHENSIVE METABOLIC PANEL
ALT: 15 U/L (ref 0–35)
AST: 19 U/L (ref 0–37)
Albumin: 4.2 g/dL (ref 3.5–5.2)
Alkaline Phosphatase: 67 U/L (ref 39–117)
BUN: 10 mg/dL (ref 6–23)
CO2: 28 mEq/L (ref 19–32)
Calcium: 9.8 mg/dL (ref 8.4–10.5)
Chloride: 102 mEq/L (ref 96–112)
Creatinine, Ser: 1.07 mg/dL (ref 0.40–1.20)
GFR: 54.89 mL/min — ABNORMAL LOW (ref >60.00)
Glucose, Bld: 120 mg/dL — ABNORMAL HIGH (ref 70–99)
Potassium: 4.1 mEq/L (ref 3.5–5.1)
Sodium: 139 mEq/L (ref 135–145)
Total Bilirubin: 0.4 mg/dL (ref 0.2–1.2)
Total Protein: 7.4 g/dL (ref 6.0–8.3)

## 2022-07-26 LAB — CBC WITH DIFFERENTIAL/PLATELET
Basophils Absolute: 0.1 10*3/uL (ref 0.0–0.1)
Basophils Relative: 1.2 % (ref 0.0–3.0)
Eosinophils Absolute: 0.2 10*3/uL (ref 0.0–0.7)
Eosinophils Relative: 2.7 % (ref 0.0–5.0)
HCT: 41.6 % (ref 36.0–46.0)
Hemoglobin: 13.7 g/dL (ref 12.0–15.0)
Lymphocytes Relative: 31.1 % (ref 12.0–46.0)
Lymphs Abs: 1.8 10*3/uL (ref 0.7–4.0)
MCHC: 33 g/dL (ref 30.0–36.0)
MCV: 94.3 fl (ref 78.0–100.0)
Monocytes Absolute: 0.4 10*3/uL (ref 0.1–1.0)
Monocytes Relative: 6.4 % (ref 3.0–12.0)
Neutro Abs: 3.4 10*3/uL (ref 1.4–7.7)
Neutrophils Relative %: 58.6 % (ref 43.0–77.0)
Platelets: 237 10*3/uL (ref 150.0–400.0)
RBC: 4.42 Mil/uL (ref 3.87–5.11)
RDW: 13.8 % (ref 11.5–15.5)
WBC: 5.8 10*3/uL (ref 4.0–10.5)

## 2022-07-26 LAB — HEMOGLOBIN A1C: Hgb A1c MFr Bld: 5.8 % (ref 4.6–6.5)

## 2022-07-26 LAB — LIPID PANEL
Cholesterol: 267 mg/dL — ABNORMAL HIGH (ref 0–200)
HDL: 77.1 mg/dL (ref >39.00)
LDL Cholesterol: 170 mg/dL — ABNORMAL HIGH (ref 0–99)
NonHDL: 190.31
Total CHOL/HDL Ratio: 3
Triglycerides: 101 mg/dL (ref 0.0–149.0)
VLDL: 20.2 mg/dL (ref 0.0–40.0)

## 2022-07-26 NOTE — Assessment & Plan Note (Signed)
No current treatment.

## 2022-07-26 NOTE — Assessment & Plan Note (Signed)
D level added to labs  Discussed improvement to bone and overall health   She takes over the counter

## 2022-07-26 NOTE — Assessment & Plan Note (Addendum)
Pt has areas of induration and scale on legs (today worse on L) Looks like picked areas No infection Unsure of dx or cause  Has uses some px creams- ? Name [facility] Spaniel derm did not dx her with anything  We will send for her derm notes from [facility] and go from there Encouraged use of vaseline or aquaphor Encouraged her not to scratch or pick  Addendum-found last visit derm Dr Gwen Pounds and only able to see treatment of nasal lesion, not legs

## 2022-07-26 NOTE — Assessment & Plan Note (Signed)
She continues symbicort and albuterol

## 2022-07-26 NOTE — Assessment & Plan Note (Signed)
Ref to ENT Keeps her coughing  Treatment of GERD did not help  Antihistamines did not help   Cannot lie down to do the mri that the neuro ordered for her ms

## 2022-07-26 NOTE — Progress Notes (Signed)
Subjective:    Patient ID: Karina [name], female    DOB: 18-Dec-1955, 67 y.o.   MRN: [id number]  HPI Pt presents for f/u of GERD and chronic medical problems  Throat complaint  Leg lesion  Intol of crestor    Wt Readings from Last 3 Encounters:  07/26/22 193 lb 2 oz (87.6 kg)  05/25/22 196 lb 2 oz (89 kg)  04/15/22 195 lb 8 oz (88.7 kg)   37.72 kg/m  Vitals:   07/26/22 1110  BP: 126/82  Pulse: 72  Temp: 97.6 F (36.4 C)  SpO2: 97%     Last visit discussed GERD causing cough and globus sensatoin  Noted protonix did not work  Has chronic epigastric pain also   Still coughing in am -no improvement  Still feels junk in throat  Did help the stomach a little bit   Watching diet - nothing seems to flare her   Still cannot lie flat without coughing   (Low back and hips hurt also)    Is a former smoker Has asthmatic bronchitis and chronic cough from smoking in past Quit 3 years ago  Symbicort was recommended to her -still uses that  Albuterol as needed   Keeps mucous in her throat  Still feels knot in throat also  If she talks a lot get hoarse and throat gets dry   Dr Jenne Pane ? Did her laryngoscopy   Overall headache and dizziness is improved With MS - less balance / hits shoulders in doorways Hands shake more    Sinuses bother her    We px omeprazole 20 mg daily  Pepcid 20 mg bid  Discussed dietary change    Needed better GERD control to be able to tolerate MRI at neuro for headache and dizziness   Hyperlipidemia Noted allergy to generic crestor (rash but could take name brand)   Intol of atorvastatin and pravastatin in the past   Lab Results  Component Value Date   CHOL 258 (H) 05/07/2021   HDL 69.00 05/07/2021   LDLCALC 173 (H) 05/07/2021   LDLDIRECT 206.5 01/24/2013   TRIG 84.0 05/07/2021   CHOLHDL 4 05/07/2021   She cut out red meat  Lab Results  Component Value Date   HGBA1C 5.9 05/07/2021   Sores on her legs  Hurt  painful to  cough Skin comes off and then it forms a sore  She tries not to pick  Both sides Above the knee   Dermatologist gave her creams - and this does not help Also neo sporin Lots of things over the counter Vaseline helps the area but then she gets a new one Dry and crusty   Very dry skin  Has tried everything over the counter  Now little red spots on both arms    Not necessarily any trauma  Takes 81 mg asa   Lab Results  Component Value Date   WBC 6.2 05/07/2021   HGB 15.1 (H) 05/07/2021   HCT 46.1 (H) 05/07/2021   MCV 95.1 05/07/2021   PLT 193.0 05/07/2021   Patient Active Problem List   Diagnosis Date Noted   Petechiae 07/26/2022   Globus sensation 07/26/2022   Viral conjunctivitis 03/30/2022   Left shoulder pain 08/14/2021   Occult blood positive stool 05/25/2021   Colon cancer screening 05/07/2021   Estrogen deficiency 05/07/2021   Rheumatoid arthritis (HCC) 03/31/2021   Coronary artery calcification 03/31/2021   Hypokalemia 08/27/2020   Joint pain in both hands 10/12/2019  Head pain 08/22/2019   Elevated serum creatinine 04/24/2019   Frequent urination 04/20/2019   Decreased appetite 04/20/2019   Encounter for screening mammogram for breast cancer 04/20/2019   Seborrheic keratosis, inflamed 12/15/2017   B12 deficiency 08/24/2017   Left low back pain 08/24/2017   Leg lesion 04/05/2017   Erosive osteoarthritis of both hands 09/09/2016   Fatigue 09/07/2016   Bilateral hand pain 09/07/2016   Routine general medical examination at a health care facility 05/06/2015   Large breasts 05/06/2015   Lumbar disc disease 11/01/2014   Knee pain, bilateral 03/11/2014   Thoracic back pain 11/30/2013   Encounter for routine gynecological examination 01/31/2013   Encounter for Medicare annual wellness exam 01/23/2013   Chest pain 09/20/2012   Dyspnea 09/13/2012   Asthmatic bronchitis , chronic 12/06/2011   Chronic cough 11/22/2011   Anxiety disorder 07/17/2009    HEMORRHOIDS-EXTERNAL 11/07/2008   IRRITABLE BOWEL SYNDROME 11/07/2008   Lung nodule 10/14/2008   Prediabetes 10/14/2008   PERSONAL HX COLONIC POLYPS 10/08/2008   Vitamin D deficiency 07/23/2008   SYNCOPE, HX OF 06/01/2008   FOOT SURGERY, HX OF 06/01/2008   DILATION AND CURETTAGE, HX OF 06/01/2008   OSTEOARTHRITIS, SHOULDER, RIGHT 03/22/2008   ROTATOR CUFF SYNDROME, RIGHT 03/22/2008   ARTHRALGIA 12/12/2007   Allergic rhinitis 08/01/2007   Former smoker 10/17/2006   Peripheral neuropathy 10/17/2006   FOOT PAIN, BILATERAL 10/17/2006   HYPOGLYCEMIA 09/30/2006   HYPERCHOLESTEROLEMIA 09/30/2006   MULTIPLE SCLEROSIS 09/30/2006   GERD 09/30/2006   FIBROCYSTIC BREAST DISEASE 09/30/2006   Dizziness 09/30/2006   Past Medical History:  Diagnosis Date   Allergic rhinitis    Allergy    Anxiety    [facility]- Evalina Field, no per pt   Cataract    Colon polyps 08/2008   Adenomatous   COPD (chronic obstructive pulmonary disease) (HCC)    Depression    no per pt   Diabetes mellitus    Gastritis 08/2008   H. pylori   GERD (gastroesophageal reflux disease)    Hypercholesteremia    Lung nodule    Menopausal disorder    Multiple sclerosis (HCC)    Neuromuscular disorder (HCC)    Plantar fasciitis    Seizures (HCC)    1 seizure in 2001 due to blood sugar dropping to 38, none since   Shingles    Left V1   Urinary incontinence    Vertigo    Past Surgical History:  Procedure Laterality Date   CERVICAL DISCECTOMY  2004   x 2, Anterior; Fusion C4-5, C5-6, C6-7, Dr. Donalee Citrin   CHEST CT  11/2008   With small 4 mm nodule L lung base (rec re check in 1 year)   CHEST CT  07/2009   Re check chest CT - lung nodule stable   CHOLECYSTECTOMY     COLONOSCOPY  08/2008   Polyps/ re check 5 yrs   CT SINUS LTD W/O CM  02/2009   negative   DILATION AND CURETTAGE OF UTERUS     ESOPHAGOGASTRODUODENOSCOPY  08/2008   Erosive gastritis, h pylori (treated)   FOOT SURGERY     left plantar fascial  problem   ROTATOR CUFF REPAIR     right   TONSILLECTOMY     TUBAL LIGATION     UPPER GASTROINTESTINAL ENDOSCOPY     Social History   Tobacco Use   Smoking status: Former    Packs/day: 2.00    Years: 30.00    Additional pack years: 0.00  Total pack years: 60.00    Types: Cigarettes    Quit date: 05/06/2021    Years since quitting: 1.2   Smokeless tobacco: Never  Vaping Use   Vaping Use: Never used  Substance Use Topics   Alcohol use: No    Alcohol/week: 0.0 standard drinks of alcohol   Drug use: No   Family History  Problem Relation Age of Onset   Migraines Mother    Heart attack Mother    Coronary artery disease Mother    Coronary artery disease Father        6 bypasses    Stroke Father    Diabetes Father    Renal Disease Father    Colon cancer Maternal Grandfather 57   Coronary artery disease Cousin    Coronary artery disease Paternal Grandmother    Lung cancer Paternal Aunt    Multiple sclerosis Neg Hx    Esophageal cancer Neg Hx    Rectal cancer Neg Hx    Stomach cancer Neg Hx    Breast cancer Neg Hx    Allergies  Allergen Reactions   Atorvastatin Other (See Comments)    elevated LFT's   Percocet [Oxycodone-Acetaminophen] Shortness Of Breath    Felt like going to pass out and sweating   Tecfidera [Dimethyl Fumarate] Shortness Of Breath, Palpitations, Rash and Cough   Amitriptyline Hcl Itching and Swelling   Betaseron [Interferon Beta-1b] Other (See Comments)    Headache   Duloxetine Other (See Comments)    pain   Pregabalin Other (See Comments)    LE swelling   Zoloft [Sertraline Hcl] Other (See Comments)    Headaches    Crestor [Rosuvastatin] Other (See Comments)    Muscle aches and cramps    Clarithromycin Other (See Comments)    reaction not known   Levofloxacin Rash   Pseudoephedrine Other (See Comments)    legs hurt   Current Outpatient Medications on File Prior to Visit  Medication Sig Dispense Refill   albuterol (VENTOLIN HFA) 108 (90  Base) MCG/ACT inhaler INHALE 2 PUFFS UP TO EVERY 4 HOURS AS NEEDED FOR WHEEZING 3 each 3   ALPRAZolam (XANAX) 0.5 MG tablet Take 1 tablet (0.5 mg total) by mouth 2 (two) times daily as needed. For anxiety. 30 tablet 0   aspirin EC 81 MG tablet Take 1 tablet (81 mg total) by mouth daily. Swallow whole. 90 tablet 3   budesonide-formoterol (SYMBICORT) 160-4.5 MCG/ACT inhaler Inhale 2 puffs into the lungs 2 (two) times daily. 3 each 2   cholecalciferol (VITAMIN D3) 25 MCG (1000 UNIT) tablet Take 1,000 Units by mouth daily.     famotidine (PEPCID) 20 MG tablet TAKE 1 TABLET BY MOUTH TWICE A DAY 180 tablet 0   fluticasone (FLONASE) 50 MCG/ACT nasal spray Place 2 sprays into both nostrils daily. 16 g 11   gabapentin (NEURONTIN) 300 MG capsule Take 600 mg by mouth at bedtime.     ipratropium-albuterol (DUONEB) 0.5-2.5 (3) MG/3ML SOLN TAKE 3 MLS BY NEBULIZATION EVERY 6 (SIX) HOURS AS NEEDED. 360 mL 0   Multiple Vitamin (MULTIVITAMIN) tablet Take 1 tablet by mouth daily.     Omega-3 Fatty Acids (FISH OIL PO) Take 1 capsule by mouth daily.     Spacer/Aero-Holding Chambers (AEROCHAMBER MV) inhaler Use as instructed 1 each 0   No current facility-administered medications on file prior to visit.        Review of Systems  Constitutional:  Negative for activity change, appetite change, fatigue, fever  and unexpected weight change.  HENT:  Negative for congestion, ear pain, rhinorrhea, sinus pressure and sore throat.   Eyes:  Negative for pain, redness and visual disturbance.  Respiratory:  Negative for cough, shortness of breath and wheezing.   Cardiovascular:  Negative for chest pain and palpitations.  Gastrointestinal:  Negative for abdominal pain, blood in stool, constipation and diarrhea.  Endocrine: Negative for polydipsia and polyuria.  Genitourinary:  Negative for dysuria, frequency and urgency.  Musculoskeletal:  Negative for arthralgias, back pain and myalgias.  Skin:  Negative for pallor and  rash.       Lesions on legs   Red spots on arms and hands  Allergic/Immunologic: Negative for environmental allergies.  Neurological:  Negative for dizziness, syncope and headaches.  Hematological:  Negative for adenopathy. Does not bruise/bleed easily.  Psychiatric/Behavioral:  Negative for decreased concentration and dysphoric mood. The patient is not nervous/anxious.        Objective:   Physical Exam Constitutional:      General: She is not in acute distress.    Appearance: Normal appearance. She is well-developed. She is obese. She is not ill-appearing or diaphoretic.  HENT:     Head: Normocephalic and atraumatic.     Right Ear: Tympanic membrane and ear canal normal.     Left Ear: Tympanic membrane and ear canal normal.     Nose: Rhinorrhea present.     Mouth/Throat:     Mouth: Mucous membranes are moist.     Pharynx: Oropharynx is clear. No oropharyngeal exudate or posterior oropharyngeal erythema.  Eyes:     General:        Right eye: No discharge.        Left eye: No discharge.     Conjunctiva/sclera: Conjunctivae normal.     Pupils: Pupils are equal, round, and reactive to light.  Neck:     Thyroid: No thyromegaly.     Vascular: No carotid bruit or JVD.  Cardiovascular:     Rate and Rhythm: Normal rate and regular rhythm.     Heart sounds: Normal heart sounds.     No gallop.  Pulmonary:     Effort: Pulmonary effort is normal. No respiratory distress.     Breath sounds: Normal breath sounds. No wheezing or rales.     Comments: Diffusely distant bs  Abdominal:     General: There is no distension or abdominal bruit.     Palpations: Abdomen is soft. There is no mass.     Tenderness: There is no abdominal tenderness.  Musculoskeletal:     Cervical back: Normal range of motion and neck supple.     Right lower leg: No edema.     Left lower leg: No edema.  Lymphadenopathy:     Cervical: No cervical adenopathy.  Skin:    General: Skin is warm and dry.      Coloration: Skin is not pale.     Findings: No rash.     Comments: Scabs on upper legs-worse on L /different ages with scale   Erythematous spots on arms and hands consistent with senile purpura   Neurological:     Mental Status: She is alert.     Cranial Nerves: No cranial nerve deficit.     Motor: No weakness.     Coordination: Coordination normal.     Deep Tendon Reflexes: Reflexes are normal and symmetric. Reflexes normal.  Psychiatric:        Attention and Perception: Attention normal.  Mood and Affect: Mood is depressed.     Comments: Down today  Seems negative Answers questions briefly  Voices frustration            Assessment & Plan:   Problem List Items Addressed This Visit       Respiratory   Asthmatic bronchitis , chronic    She continues symbicort and albuterol         Other   Fatigue    Suspect multi factorial Very negative mood today /did not want to address Frustrated with many chronic problems  Lab ordered       Relevant Orders   TSH   Comprehensive metabolic panel   CBC with Differential/Platelet   Globus sensation - Primary    Ref to ENT Keeps her coughing  Treatment of GERD did not help  Antihistamines did not help   Cannot lie down to do the mri that the neuro ordered for her ms      Relevant Orders   Ambulatory referral to ENT   HYPERCHOLESTEROLEMIA    Pt has not been able to tolerate atorvastatin or pravastatin or rosuvastatin  Causes muscle pain and cramps   Has cut out red meat Lab today for lipids       Relevant Orders   Lipid panel   Comprehensive metabolic panel   Leg lesion    Pt has areas of induration and scale on legs (today worse on L) Looks like picked areas No infection Unsure of dx or cause  Has uses some px creams- ? Name [facility] derm did not dx her with anything  We will send for her derm notes from [place] and go from there Encouraged use of vaseline or aquaphor Encouraged her not to scratch or  pick  Addendum-found last visit derm Dr Gwen Pounds and only able to see treatment of nasal lesion, not legs       Petechiae    Suspect some senile purpura with think skin/photo aging and asa 81 mg   Cbc with platelets today Reassured       Prediabetes    Due for A1c disc imp of low glycemic diet and wt loss to prevent DM2       Relevant Orders   Hemoglobin A1c   Vitamin D deficiency    D level added to labs  Discussed improvement to bone and overall health   She takes over the counter       Relevant Orders   VITAMIN D 25 Hydroxy (Vit-D Deficiency, Fractures)

## 2022-07-26 NOTE — Assessment & Plan Note (Signed)
Due for A1c disc imp of low glycemic diet and wt loss to prevent DM2

## 2022-07-26 NOTE — Assessment & Plan Note (Signed)
Suspect some senile purpura with think skin/photo aging and asa 81 mg   Cbc with platelets today Reassured

## 2022-07-26 NOTE — Patient Instructions (Addendum)
Stop the omeprazole since it does not help  Continue the generic pepcid since it helps stomach a little time   I will place a new referral to ENT (throat doctor) to further evaluate your throat symptoms that cause you to cough   I want you to see ENT for throat issues and cough  I put the referral in  Please let us know if you don't hear in 1-2 weeks     Labs today for chronic medical problems and fatigue  I think the red spots are tiny bruises from age and also aspirin  I will check labs for platelet count    I will send for dermatology notes to see what your skin diagnosis is  Aquaphor or vaseline for the spots are ok

## 2022-07-26 NOTE — Telephone Encounter (Signed)
Left VM requesting pt to call the office back 

## 2022-07-26 NOTE — Telephone Encounter (Signed)
-----   Message from Judy Pimple, MD sent at 07/26/2022  1:34 PM EDT ----- Please let pt know I found note from derm [facility] = all that was mentioned was nose, not legs Does she want to see a new dermatologist?

## 2022-07-26 NOTE — Assessment & Plan Note (Signed)
No symptoms today

## 2022-07-26 NOTE — Assessment & Plan Note (Signed)
Pt has not been able to tolerate atorvastatin or pravastatin or rosuvastatin  Causes muscle pain and cramps   Has cut out red meat Lab today for lipids

## 2022-07-26 NOTE — Assessment & Plan Note (Signed)
Suspect multi factorial Very negative mood today /did not want to address Frustrated with many chronic problems  Lab ordered

## 2022-07-27 LAB — TSH: TSH: 2.84 u[IU]/mL (ref 0.35–5.50)

## 2022-07-27 LAB — VITAMIN D 25 HYDROXY (VIT D DEFICIENCY, FRACTURES): VITD: 32.5 ng/mL (ref 30.00–100.00)

## 2022-07-29 ENCOUNTER — Encounter: Payer: Self-pay | Admitting: *Deleted

## 2022-07-29 ENCOUNTER — Other Ambulatory Visit: Payer: Self-pay | Admitting: Family Medicine

## 2022-07-29 DIAGNOSIS — Z1231 Encounter for screening mammogram for malignant neoplasm of breast: Secondary | ICD-10-CM

## 2022-07-30 ENCOUNTER — Ambulatory Visit
Admission: RE | Admit: 2022-07-30 | Discharge: 2022-07-30 | Disposition: A | Discharge status: Home / Self Care | Payer: Medicare HMO | Source: Ambulatory Visit | Attending: Family Medicine | Admitting: Family Medicine

## 2022-07-30 ENCOUNTER — Telehealth: Payer: Self-pay

## 2022-07-30 DIAGNOSIS — Z1231 Encounter for screening mammogram for malignant neoplasm of breast: Secondary | ICD-10-CM

## 2022-07-30 NOTE — Progress Notes (Signed)
Care Management & Coordination Services Pharmacy Team  Reason for Encounter: Medication adherence  Patient is at risk for failing the Medicare Adherence STAR measure for the following medication:  Rosuvastatin   Pharmacy fill dates/day supply this year: Rosuvastatin 20mg   10/02/21  90ds  patient splits pill 1/2  Patient is more than 5 days past due for refill of the above medication.  No further action needed-   medication discontinued by PCP due to intolerance 07/26/22    Al Corpus, PharmD notified  Burt Knack, [facility] Clinical Pharmacy Assistant [phone]

## 2022-07-31 DIAGNOSIS — R109 Unspecified abdominal pain: Secondary | ICD-10-CM | POA: Insufficient documentation

## 2022-07-31 NOTE — Assessment & Plan Note (Signed)
Pt is worried this could be from her kidney  Is acute on chronic Will order renal ultrasound

## 2022-07-31 NOTE — Addendum Note (Signed)
Addended by: Roxy Manns A on: 07/31/2022 10:04 PM   Modules accepted: Orders

## 2022-08-05 ENCOUNTER — Ambulatory Visit
Admission: RE | Admit: 2022-08-05 | Discharge: 2022-08-05 | Disposition: A | Discharge status: Home / Self Care | Payer: Medicare HMO | Source: Ambulatory Visit | Attending: Family Medicine | Admitting: Family Medicine

## 2022-08-05 DIAGNOSIS — R109 Unspecified abdominal pain: Secondary | ICD-10-CM

## 2022-08-05 DIAGNOSIS — R1032 Left lower quadrant pain: Secondary | ICD-10-CM | POA: Diagnosis not present

## 2022-08-13 ENCOUNTER — Ambulatory Visit: Payer: Medicare HMO | Admitting: Family Medicine

## 2022-08-13 NOTE — Telephone Encounter (Signed)
Pt never returned my call but is coming in for a f/u, PCP can address with pt directly

## 2022-08-16 ENCOUNTER — Ambulatory Visit: Payer: Medicare HMO | Admitting: Adult Health

## 2022-09-30 ENCOUNTER — Ambulatory Visit (INDEPENDENT_AMBULATORY_CARE_PROVIDER_SITE_OTHER): Payer: Medicare HMO

## 2022-09-30 VITALS — Ht 60.0 in | Wt 189.0 lb

## 2022-09-30 DIAGNOSIS — R4589 Other symptoms and signs involving emotional state: Secondary | ICD-10-CM

## 2022-09-30 DIAGNOSIS — Z0000 Encounter for general adult medical examination without abnormal findings: Secondary | ICD-10-CM | POA: Diagnosis not present

## 2022-09-30 NOTE — Patient Instructions (Signed)
Karina [name] , Thank you for taking time to come for your Medicare Wellness Visit. I appreciate your ongoing commitment to your health goals. Please review the following plan we discussed and let me know if I can assist you in the future.   Referrals/Orders/Follow-Ups/Clinician Recommendations: Aim for 30 minutes of exercise or brisk walking, 6-8 glasses of water, and 5 servings of fruits and vegetables each day. Referral for outside resources placed.  This is a list of the screening recommended for you and due dates:  Health Maintenance  Topic Date Due   Zoster (Shingles) Vaccine (1 of 2) Never done   Pap Smear  05/05/2018   Screening for Lung Cancer  02/26/2022   Medicare Annual Wellness Visit  09/30/2022   COVID-19 Vaccine (1) 09/26/2024*   Flu Shot  10/07/2022   DTaP/Tdap/Td vaccine (3 - Td or Tdap) 02/01/2023   Mammogram  07/30/2023   Colon Cancer Screening  07/30/2026   Hepatitis C Screening  Completed   HIV Screening  Completed   HPV Vaccine  Aged Out  *Topic was postponed. The date shown is not the original due date.    Advanced directives: (Declined) Advance directive discussed with you today. Even though you declined this today, please call our office should you change your mind, and we can give you the proper paperwork for you to fill out.  Next Medicare Annual Wellness Visit scheduled for next year: Yes  Preventive Care 40-64 Years, Female Preventive care refers to lifestyle choices and visits with your health care provider that can promote health and wellness. What does preventive care include? A yearly physical exam. This is also called an annual well check. Dental exams once or twice a year. Routine eye exams. Ask your health care provider how often you should have your eyes checked. Personal lifestyle choices, including: Daily care of your teeth and gums. Regular physical activity. Eating a healthy diet. Avoiding tobacco and drug use. Limiting alcohol  use. Practicing safe sex. Taking low-dose aspirin daily starting at age 1. Taking vitamin and mineral supplements as recommended by your health care provider. What happens during an annual well check? The services and screenings done by your health care provider during your annual well check will depend on your age, overall health, lifestyle risk factors, and family history of disease. Counseling  Your health care provider may ask you questions about your: Alcohol use. Tobacco use. Drug use. Emotional well-being. Home and relationship well-being. Sexual activity. Eating habits. Work and work [employer]. Method of birth control. Menstrual cycle. Pregnancy history. Screening  You may have the following tests or measurements: Height, weight, and BMI. Blood pressure. Lipid and cholesterol levels. These may be checked every 5 years, or more frequently if you are over 16 years old. Skin check. Lung cancer screening. You may have this screening every year starting at age 25 if you have a 30-pack-year history of smoking and currently smoke or have quit within the past 15 years. Fecal occult blood test (FOBT) of the stool. You may have this test every year starting at age 84. Flexible sigmoidoscopy or colonoscopy. You may have a sigmoidoscopy every 5 years or a colonoscopy every 10 years starting at age 42. Hepatitis C blood test. Hepatitis B blood test. Sexually transmitted disease (STD) testing. Diabetes screening. This is done by checking your blood sugar (glucose) after you have not eaten for a while (fasting). You may have this done every 1-3 years. Mammogram. This may be done every 1-2 years. Talk  to your health care provider about when you should start having regular mammograms. This may depend on whether you have a family history of breast cancer. BRCA-related cancer screening. This may be done if you have a family history of breast, ovarian, tubal, or peritoneal cancers. Pelvic  exam and Pap test. This may be done every 3 years starting at age 43. Starting at age 26, this may be done every 5 years if you have a Pap test in combination with an HPV test. Bone density scan. This is done to screen for osteoporosis. You may have this scan if you are at high risk for osteoporosis. Discuss your test results, treatment options, and if necessary, the need for more tests with your health care provider. Vaccines  Your health care provider may recommend certain vaccines, such as: Influenza vaccine. This is recommended every year. Tetanus, diphtheria, and acellular pertussis (Tdap, Td) vaccine. You may need a Td booster every 10 years. Zoster vaccine. You may need this after age 60. Pneumococcal 13-valent conjugate (PCV13) vaccine. You may need this if you have certain conditions and were not previously vaccinated. Pneumococcal polysaccharide (PPSV23) vaccine. You may need one or two doses if you smoke cigarettes or if you have certain conditions. Talk to your health care provider about which screenings and vaccines you need and how often you need them. This information is not intended to replace advice given to you by your health care provider. Make sure you discuss any questions you have with your health care provider. Document Released: 03/21/2015 Document Revised: 11/12/2015 Document Reviewed: 12/24/2014 Elsevier Interactive Patient Education  2017 [employer].    Fall Prevention in the Home Falls can cause injuries. They can happen to people of all ages. There are many things you can do to make your home safe and to help prevent falls. What can I do on the outside of my home? Regularly fix the edges of walkways and driveways and fix any cracks. Remove anything that might make you trip as you walk through a door, such as a raised step or threshold. Trim any bushes or trees on the path to your home. Use bright outdoor lighting. Clear any walking paths of anything that might  make someone trip, such as rocks or tools. Regularly check to see if handrails are loose or broken. Make sure that both sides of any steps have handrails. Any raised decks and porches should have guardrails on the edges. Have any leaves, snow, or ice cleared regularly. Use sand or salt on walking paths during winter. Clean up any spills in your garage right away. This includes oil or grease spills. What can I do in the bathroom? Use night lights. Install grab bars by the toilet and in the tub and shower. Do not use towel bars as grab bars. Use non-skid mats or decals in the tub or shower. If you need to sit down in the shower, use a plastic, non-slip stool. Keep the floor dry. Clean up any water that spills on the floor as soon as it happens. Remove soap buildup in the tub or shower regularly. Attach bath mats securely with double-sided non-slip rug tape. Do not have throw rugs and other things on the floor that can make you trip. What can I do in the bedroom? Use night lights. Make sure that you have a light by your bed that is easy to reach. Do not use any sheets or blankets that are too big for your bed. They should not  hang down onto the floor. Have a firm chair that has side arms. You can use this for support while you get dressed. Do not have throw rugs and other things on the floor that can make you trip. What can I do in the kitchen? Clean up any spills right away. Avoid walking on wet floors. Keep items that you use a lot in easy-to-reach places. If you need to reach something above you, use a strong step stool that has a grab bar. Keep electrical cords out of the way. Do not use floor polish or wax that makes floors slippery. If you must use wax, use non-skid floor wax. Do not have throw rugs and other things on the floor that can make you trip. What can I do with my stairs? Do not leave any items on the stairs. Make sure that there are handrails on both sides of the stairs  and use them. Fix handrails that are broken or loose. Make sure that handrails are as long as the stairways. Check any carpeting to make sure that it is firmly attached to the stairs. Fix any carpet that is loose or worn. Avoid having throw rugs at the top or bottom of the stairs. If you do have throw rugs, attach them to the floor with carpet tape. Make sure that you have a light switch at the top of the stairs and the bottom of the stairs. If you do not have them, ask someone to add them for you. What else can I do to help prevent falls? Wear shoes that: Do not have high heels. Have rubber bottoms. Are comfortable and fit you well. Are closed at the toe. Do not wear sandals. If you use a stepladder: Make sure that it is fully opened. Do not climb a closed stepladder. Make sure that both sides of the stepladder are locked into place. Ask someone to hold it for you, if possible. Clearly mark and make sure that you can see: Any grab bars or handrails. First and last steps. Where the edge of each step is. Use tools that help you move around (mobility aids) if they are needed. These include: Canes. Walkers. Scooters. Crutches. Turn on the lights when you go into a dark area. Replace any light bulbs as soon as they burn out. Set up your furniture so you have a clear path. Avoid moving your furniture around. If any of your floors are uneven, fix them. If there are any pets around you, be aware of where they are. Review your medicines with your doctor. Some medicines can make you feel dizzy. This can increase your chance of falling. Ask your doctor what other things that you can do to help prevent falls. This information is not intended to replace advice given to you by your health care provider. Make sure you discuss any questions you have with your health care provider. Document Released: 12/19/2008 Document Revised: 07/31/2015 Document Reviewed: 03/29/2014 Elsevier Interactive Patient  Education  2017 [employer].

## 2022-09-30 NOTE — Progress Notes (Signed)
Subjective:   Karina [name] is a 67 y.o. female who presents for Medicare Annual (Subsequent) preventive examination.  Visit Complete: Virtual  I connected with  Ed M Jellison on 09/30/22 by a audio enabled telemedicine application and verified that I am speaking with the correct person using two identifiers.  Patient Location: Home  Provider Location: Home Office  I discussed the limitations of evaluation and management by telemedicine. The patient expressed understanding and agreed to proceed.   Review of Systems      Cardiac Risk Factors include: advanced age (>50men, >33 women);sedentary lifestyle;smoking/ tobacco exposure  Per patient no change in vitals since last visit; unable to obtain new vitals due to this being a telehealth visit.   Patient was unable to self-report vital signs via telehealth due to a lack of equipment at home.     Objective:    Today's Vitals   09/30/22 1048  Weight: 189 lb (85.7 kg)  Height: 5' (1.524 m)  PainSc: 7    Body mass index is 36.91 kg/m.     09/30/2022   11:01 AM 09/29/2021    1:04 PM 08/18/2020    8:31 AM 10/17/2019   12:31 PM 08/22/2019    1:51 PM 06/03/2017   11:05 AM 03/31/2017    1:07 AM  Advanced Directives  Does Patient Have a Medical Advance Directive? No No No No No Yes No  Type of Advance Directive      Living will   Does patient want to make changes to medical advance directive?    No - Patient declined  No - Patient declined   Would patient like information on creating a medical advance directive? No - Patient declined No - Patient declined No - Patient declined    No - Patient declined    Current Medications (verified) Outpatient Encounter Medications as of 09/30/2022  Medication Sig   albuterol (VENTOLIN HFA) 108 (90 Base) MCG/ACT inhaler INHALE 2 PUFFS UP TO EVERY 4 HOURS AS NEEDED FOR WHEEZING   ALPRAZolam (XANAX) 0.5 MG tablet Take 1 tablet (0.5 mg total) by mouth 2 (two) times daily as needed. For anxiety.    aspirin EC 81 MG tablet Take 1 tablet (81 mg total) by mouth daily. Swallow whole.   budesonide-formoterol (SYMBICORT) 160-4.5 MCG/ACT inhaler Inhale 2 puffs into the lungs 2 (two) times daily.   cholecalciferol (VITAMIN D3) 25 MCG (1000 UNIT) tablet Take 1,000 Units by mouth daily.   famotidine (PEPCID) 20 MG tablet TAKE 1 TABLET BY MOUTH TWICE A DAY   fluticasone (FLONASE) 50 MCG/ACT nasal spray Place 2 sprays into both nostrils daily.   gabapentin (NEURONTIN) 300 MG capsule Take 600 mg by mouth at bedtime.   ipratropium-albuterol (DUONEB) 0.5-2.5 (3) MG/3ML SOLN TAKE 3 MLS BY NEBULIZATION EVERY 6 (SIX) HOURS AS NEEDED.   Omega-3 Fatty Acids (FISH OIL PO) Take 1 capsule by mouth daily.   Spacer/Aero-Holding Chambers (AEROCHAMBER MV) inhaler Use as instructed   Multiple Vitamin (MULTIVITAMIN) tablet Take 1 tablet by mouth daily. (Patient not taking: Reported on 09/30/2022)   No facility-administered encounter medications on file as of 09/30/2022.    Allergies (verified) Atorvastatin, Percocet [oxycodone-acetaminophen], Tecfidera [dimethyl fumarate], Amitriptyline hcl, Betaseron [interferon beta-1b], Duloxetine, Pregabalin, Zoloft [sertraline hcl], Crestor [rosuvastatin], Clarithromycin, Levofloxacin, and Pseudoephedrine   History: Past Medical History:  Diagnosis Date   Allergic rhinitis    Allergy    Anxiety    [facility]- Evalina Field, no per pt   Cataract  Colon polyps 08/2008   Adenomatous   COPD (chronic obstructive pulmonary disease) (HCC)    Depression    no per pt   Diabetes mellitus    Gastritis 08/2008   H. pylori   GERD (gastroesophageal reflux disease)    Hypercholesteremia    Lung nodule    Menopausal disorder    Multiple sclerosis (HCC)    Neuromuscular disorder (HCC)    Plantar fasciitis    Seizures (HCC)    1 seizure in 2001 due to blood sugar dropping to 38, none since   Shingles    Left V1   Urinary incontinence    Vertigo    Past Surgical History:   Procedure Laterality Date   CATARACT EXTRACTION Bilateral 2023   CERVICAL DISCECTOMY  03/08/2002   x 2, Anterior; Fusion C4-5, C5-6, C6-7, Dr. Donalee Citrin   CHEST CT  11/06/2008   With small 4 mm nodule L lung base (rec re check in 1 year)   CHEST CT  07/06/2009   Re check chest CT - lung nodule stable   CHOLECYSTECTOMY     COLONOSCOPY  08/06/2008   Polyps/ re check 5 yrs   CT SINUS LTD W/O CM  02/05/2009   negative   DILATION AND CURETTAGE OF UTERUS     ESOPHAGOGASTRODUODENOSCOPY  08/06/2008   Erosive gastritis, h pylori (treated)   FOOT SURGERY     left plantar fascial problem   ROTATOR CUFF REPAIR     right   TONSILLECTOMY     TUBAL LIGATION     UPPER GASTROINTESTINAL ENDOSCOPY     Family History  Problem Relation Age of Onset   Migraines Mother    Heart attack Mother    Coronary artery disease Mother    Coronary artery disease Father        6 bypasses    Stroke Father    Diabetes Father    Renal Disease Father    Colon cancer Maternal Grandfather 38   Coronary artery disease Cousin    Coronary artery disease Paternal Grandmother    Lung cancer Paternal Aunt    Multiple sclerosis Neg Hx    Esophageal cancer Neg Hx    Rectal cancer Neg Hx    Stomach cancer Neg Hx    Breast cancer Neg Hx    Social History   Socioeconomic History   Marital status: Married    Spouse name: Not on file   Number of children: 3   Years of education: 9 th   Highest education level: Not on file  Occupational History   Occupation: Disabled     Employer: DISABLED  Tobacco Use   Smoking status: Former    Current packs/day: 0.00    Average packs/day: 2.0 packs/day for 30.0 years (60.0 ttl pk-yrs)    Types: Cigarettes    Start date: 05/07/1991    Quit date: 05/06/2021    Years since quitting: 1.4   Smokeless tobacco: Never  Vaping Use   Vaping status: Never Used  Substance and Sexual Activity   Alcohol use: No    Alcohol/week: 0.0 standard drinks of alcohol   Drug use: No    Sexual activity: Not on file  Other Topics Concern   Not on file  Social History Narrative   Married with 3 children   Daily caffeine use - 2    Disabled.   Education 9th grade    Caffeine one cup of coffee daily.   Patient is right handed.  Social Determinants of Health   Financial Resource Strain: Low Risk  (09/30/2022)   Overall Financial Resource Strain (CARDIA)    Difficulty of Paying Living Expenses: Not hard at all  Food Insecurity: No Food Insecurity (09/30/2022)   Hunger Vital Sign    Worried About Running Out of Food in the Last Year: Never true    Ran Out of Food in the Last Year: Never true  Transportation Needs: No Transportation Needs (09/30/2022)   PRAPARE - Administrator, Civil Service (Medical): No    Lack of Transportation (Non-Medical): No  Physical Activity: Inactive (09/30/2022)   Exercise Vital Sign    Days of Exercise per Week: 0 days    Minutes of Exercise per Session: 0 min  Stress: Stress Concern Present (09/30/2022)   [employer] of Occupational Health - Occupational Stress Questionnaire    Feeling of Stress : Very much  Social Connections: Moderately Isolated (09/30/2022)   Social Connection and Isolation Panel [NHANES]    Frequency of Communication with Friends and Family: Never    Frequency of Social Gatherings with Friends and Family: Never    Attends Religious Services: More than 4 times per year    Active Member of [employer] or Organizations: No    Attends Engineer, structural: Never    Marital Status: Married    Tobacco Counseling Counseling given: Not Answered   Clinical Intake:  Pre-visit preparation completed: Yes  Pain : 0-10 Pain Score: 7  Pain Type: Acute pain Pain Location: Back Pain Orientation: Lower Pain Descriptors / Indicators: Sharp Pain Onset: 1 to 4 weeks ago     BMI - recorded: 36.91 Nutritional Status: BMI > 30  Obese Nutritional Risks: Non-healing wound (Thighs, has seen  dermatology) Diabetes: No  How often do you need to have someone help you when you read instructions, pamphlets, or other written materials from your doctor or pharmacy?: 1 - Never  Interpreter Needed?: No  Information entered by :: C.Annalie Wenner LPN   Activities of Daily Living    09/30/2022   11:02 AM  In your present state of health, do you have any difficulty performing the following activities:  Hearing? 0  Vision? 0  Difficulty concentrating or making decisions? 0  Walking or climbing stairs? 1  Comment Back pain  Dressing or bathing? 0  Doing errands, shopping? 0  Preparing Food and eating ? N  Using the Toilet? N  In the past six months, have you accidently leaked urine? Y  Comment wears a pad  Do you have problems with loss of bowel control? N  Managing your Medications? N  Managing your Finances? N  Housekeeping or managing your Housekeeping? N    Patient Care Team: [name], Audrie Gallus, MD as PCP - General York Spaniel, MD (Inactive) as Consulting Physician (Neurology) Antonieta Iba, MD as Consulting Physician (Cardiology) Jethro Bolus, MD as Consulting Physician (Ophthalmology) Kathyrn Sheriff, [facility] (Inactive) as Pharmacist (Pharmacist)  Indicate any recent Medical Services you may have received from other than [facility] providers in the past year (date may be approximate).     Assessment:   This is a routine wellness examination for Raylei.  Hearing/Vision screen Hearing Screening - Comments:: Denies hearing difficulties   Vision Screening - Comments:: Glasses -Dr.Shipiro - UTD on eye exams  Dietary issues and exercise activities discussed:     Goals Addressed             This Visit's Progress  Patient Stated       Lose weight       Depression Screen    09/30/2022   10:56 AM 07/26/2022   11:18 AM 03/30/2022   12:19 PM 09/29/2021    1:03 PM 08/14/2021   11:36 AM 05/07/2021    8:09 AM 10/17/2019   12:32 PM  PHQ 2/9 Scores  PHQ - 2 Score 3  5 6  0 0 0 0  PHQ- 9 Score 9 11 12  0   0    Fall Risk    09/30/2022   11:02 AM 07/26/2022   11:18 AM 03/30/2022   12:19 PM 09/29/2021    1:05 PM 08/14/2021   11:36 AM  Fall Risk   Falls in the past year? 0 0 0 0 0  Number falls in past yr: 0 0 0 0   Injury with Fall? 0 0 0 0   Risk for fall due to : No Fall Risks No Fall Risks No Fall Risks No Fall Risks   Follow up Falls prevention discussed;Falls evaluation completed Falls evaluation completed Falls evaluation completed Falls evaluation completed Falls evaluation completed    MEDICARE RISK AT HOME:  Medicare Risk at Home - 09/30/22 1104     Any stairs in or around the home? No    If so, are there any without handrails? No    Home free of loose throw rugs in walkways, pet beds, electrical cords, etc? Yes    Adequate lighting in your home to reduce risk of falls? Yes    Life alert? No    Use of a cane, walker or w/c? Yes   cane   Grab bars in the bathroom? Yes    Shower chair or bench in shower? No    Elevated toilet seat or a handicapped toilet? No             TIMED UP AND GO:  Was the test performed?  No    Cognitive Function:    10/17/2019   12:33 PM 09/01/2016   11:51 AM 04/30/2015    1:45 PM  MMSE - Mini Mental State Exam  Orientation to time 5 5 5   Orientation to Place 5 5 5   Registration 3 3 3   Attention/ Calculation 5 0 5  Recall 3 3 3   Language- name 2 objects  0   Language- repeat 1 1 1   Language- follow 3 step command  3 3  Language- read & follow direction  0 1  Write a sentence  0   Copy design  0   Total score  20         09/30/2022   11:05 AM  6CIT Screen  What Year? 0 points  What month? 0 points  What time? 0 points  Count back from 20 0 points  Months in reverse 0 points  Repeat phrase 2 points  Total Score 2 points    Immunizations Immunization History  Administered Date(s) Administered   Influenza,inj,Quad PF,6+ Mos 01/31/2013, 03/11/2014, 12/15/2017   Pneumococcal  Polysaccharide-23 07/29/2003   Td 03/08/2000   Tdap 01/31/2013    TDAP status: Due, Education has been provided regarding the importance of this vaccine. Advised may receive this vaccine at local pharmacy or Health Dept. Aware to provide a copy of the vaccination record if obtained from local pharmacy or Health Dept. Verbalized acceptance and understanding.  Flu Vaccine status: Declined, Education has been provided regarding the importance of this vaccine but patient still declined.  Advised may receive this vaccine at local pharmacy or Health Dept. Aware to provide a copy of the vaccination record if obtained from local pharmacy or Health Dept. Verbalized acceptance and understanding.  Pneumococcal vaccine status: Up to date  Covid-19 vaccine status: Declined, Education has been provided regarding the importance of this vaccine but patient still declined. Advised may receive this vaccine at local pharmacy or Health Dept.or vaccine clinic. Aware to provide a copy of the vaccination record if obtained from local pharmacy or Health Dept. Verbalized acceptance and understanding.  Qualifies for Shingles Vaccine? Yes   Zostavax completed No   Shingrix Completed?: No.    Education has been provided regarding the importance of this vaccine. Patient has been advised to call insurance company to determine out of pocket expense if they have not yet received this vaccine. Advised may also receive vaccine at local pharmacy or Health Dept. Verbalized acceptance and understanding.  Screening Tests Health Maintenance  Topic Date Due   Zoster Vaccines- Shingrix (1 of 2) Never done   PAP SMEAR-Modifier  05/05/2018   Lung Cancer Screening  02/26/2022   COVID-19 Vaccine (1) 09/26/2024 (Originally 01/20/1961)   INFLUENZA VACCINE  10/07/2022   DTaP/Tdap/Td (3 - Td or Tdap) 02/01/2023   MAMMOGRAM  07/30/2023   Medicare Annual Wellness (AWV)  09/30/2023   Colonoscopy  07/30/2026   Hepatitis C Screening   Completed   HIV Screening  Completed   HPV VACCINES  Aged Out    Health Maintenance  Health Maintenance Due  Topic Date Due   Zoster Vaccines- Shingrix (1 of 2) Never done   PAP SMEAR-Modifier  05/05/2018   Lung Cancer Screening  02/26/2022    Colorectal cancer screening: Type of screening: Colonoscopy. Completed 07/29/21. Repeat every 5 years  Mammogram status: Completed 07/30/22. Repeat every year    Lung Cancer Screening: (Low Dose CT Chest recommended if Age 53-80 years, 20 pack-year currently smoking OR have quit w/in 15years.) does qualify.   Lung Cancer Screening Referral: declined  Additional Screening:  Hepatitis C Screening: does qualify; Completed 04/30/15  Vision Screening: Recommended annual ophthalmology exams for early detection of glaucoma and other disorders of the eye. Is the patient up to date with their annual eye exam?  Yes  Who is the provider or what is the name of the office in which the patient attends annual eye exams? Dr.Shipiro If pt is not established with a provider, would they like to be referred to a provider to establish care? Yes .   Dental Screening: Recommended annual dental exams for proper oral hygiene    Community Resource Referral / Chronic Care Management: CRR required this visit?  Yes   CCM required this visit?  No     Plan:     I have personally reviewed and noted the following in the patient's chart:   Medical and social history Use of alcohol, tobacco or illicit drugs  Current medications and supplements including opioid prescriptions. Patient is not currently taking opioid prescriptions. Functional ability and status Nutritional status Physical activity Advanced directives List of other physicians Hospitalizations, surgeries, and ER visits in previous 12 months Vitals Screenings to include cognitive, depression, and falls Referrals and appointments  In addition, I have reviewed and discussed with patient  certain preventive protocols, quality metrics, and best practice recommendations. A written personalized care plan for preventive services as well as general preventive health recommendations were provided to patient.     Maryan Puls, LPN   2/44/0102  After Visit Summary: (MyChart) Due to this being a telephonic visit, the after visit summary with patients personalized plan was offered to patient via MyChart   Nurse Notes: Community referral placed at patients request for feelings of depression. Pt stated that she would like to see a therapist and plans on reaching out to PCP to schedule appointment to discuss further.

## 2022-10-01 ENCOUNTER — Telehealth: Payer: Self-pay | Admitting: *Deleted

## 2022-10-01 NOTE — Progress Notes (Signed)
  Care Coordination   Note   10/01/2022 Name: [name] MRN: [id number] DOB: 06/02/1955  Karina [name] is a 67 y.o. year old female who sees [name], Audrie Gallus, MD for primary care. I reached out to Karina [name] by phone today to offer care coordination services.  Karina [name] was given information about Care Coordination services today including:   The Care Coordination services include support from the care team which includes your Nurse Coordinator, Clinical Social Worker, or Pharmacist.  The Care Coordination team is here to help remove barriers to the health concerns and goals most important to you. Care Coordination services are voluntary, and the patient may decline or stop services at any time by request to their care team member.   Care Coordination Consent Status: Patient did not agree to participate in care coordination services at this time.  Follow up plan:  pt declined to schedule at this time - wants to discuss with Dr Milinda Antis on next appt. Contact info given if wants to schedule with LCSW in the future   Encounter Outcome:  Pt. Refused  Burman Nieves, CCMA Care Coordination Care Guide Direct Dial: [phone]

## 2022-10-01 NOTE — Progress Notes (Signed)
  Care Coordination  Outreach Note  10/01/2022 Name: Karina [name] MRN: [id number] DOB: 07-07-1955   Care Coordination Outreach Attempts: An unsuccessful telephone outreach was attempted today to offer the patient information about available care coordination services.  Follow Up Plan:  Additional outreach attempts will be made to offer the patient care coordination information and services.   Encounter Outcome:  No Answer  Burman Nieves, CCMA Care Coordination Care Guide Direct Dial: [phone]

## 2022-10-04 ENCOUNTER — Encounter: Payer: Self-pay | Admitting: Family Medicine

## 2022-10-04 ENCOUNTER — Ambulatory Visit (INDEPENDENT_AMBULATORY_CARE_PROVIDER_SITE_OTHER): Payer: Medicare HMO | Admitting: Family Medicine

## 2022-10-04 VITALS — BP 110/66 | HR 68 | Temp 97.6°F | Ht 60.0 in | Wt 192.0 lb

## 2022-10-04 DIAGNOSIS — R4589 Other symptoms and signs involving emotional state: Secondary | ICD-10-CM | POA: Diagnosis not present

## 2022-10-04 MED ORDER — ESCITALOPRAM OXALATE 10 MG PO TABS
10.0000 mg | ORAL_TABLET | Freq: Every day | ORAL | 1 refills | Status: DC
Start: 2022-10-04 — End: 2022-10-26

## 2022-10-04 NOTE — Assessment & Plan Note (Addendum)
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  Stressors noted (loss/ marital issues/ health issues)  Mostly vegetative/sadness  and no current SI  Some irritability and anger but not feeling outwardly anxious  Reviewed stressors/ coping techniques/symptoms/ support sources/ tx options and side effects in detail today  May want to try counseling later but not yet  Has support in some church friends  Encouraged good self care   Noted past intol to duloxetine, zoloft and possible allergy to amitriptyline   Dise options Will try generic lexapro 10 mg daily in evening / may also help wit sleep Discussed expectations of SSRI medication including time to effectiveness and mechanism of action, also poss of side effects (early and late)- including mental fuzziness, weight or appetite change, nausea and poss of worse dep or anxiety (even suicidal thoughts)  Pt voiced understanding and will stop med and update if this occurs   Follow up plan 4-6 wk or earlier if needed   Call back and Er precautions noted in detail today  Encouraged to go to ER if she develops desire to harm herself   27  Minutes were spent today both face to face and in the chart obtaining history, reviewing records and test results , educating and discussing treatment options, and placing orders

## 2022-10-04 NOTE — Patient Instructions (Addendum)
Writing in a journal can be helpful  Think about doing that   I think that some mental health counseling would be good for you (talking to someone)  If you want to do that please let us know   Keep in close touch with the friends from church   Try generic lexapro 10 mg daily  Take it in the evening  It may help sleep also  If you feel worse or if any intolerable side effects- hold it and call and let us know   Schedule a follow up in 4-6 weeks  If the medicine is doing well for you we may go up on the dose

## 2022-10-04 NOTE — Progress Notes (Signed)
Subjective:    Patient ID: Karina [name], female    DOB: 01-06-1956, 67 y.o.   MRN: [id number]  HPI  Wt Readings from Last 3 Encounters:  10/04/22 192 lb (87.1 kg)  09/30/22 189 lb (85.7 kg)  07/26/22 193 lb 2 oz (87.6 kg)   37.50 kg/m  Vitals:   10/04/22 1231  BP: 110/66  Pulse: 68  Temp: 97.6 F (36.4 C)  SpO2: 94%    Some increase in stressors recently    Pt presents to discuss worsening depressed mood  Family had a bday party for grandchild without inviting here  Feels lonely  40 years of crap she had stored away came flooding back   Irritable Yells at her husband all the time  Not a good support-they never talk (he is a habitual liar)  She has threatened to leave -then he makes an effort briefly   She has no one to talk to  A lot of loss in family  Has some friends at church -new / did talk to them this weekend   Kids have nothing to do with her unless they want something   She has not been happy in years and years  No joy out of anything   Decreased appetite Not sleeping -trouble falling and staying asleep Is sleepy during the day but cannot sleep  Not a lot of motivation  Gets angry quickly      In past Zoloft caused side effects-headache Duloxetine caused side effects -pain  Amitriptyline caused itching and swelling  Thinks did not wellbutrin in the past (bad dreams?)  Marjiuana made her paranoid when she tried in past   Thinks she may have been given lithium - no recollection of bipolar  Has bipolar in the family  May have been given for weight loss  Grandmother and father had outbursts     Had counseling here once briefly    Lab Results  Component Value Date   HGBA1C 5.8 07/26/2022         10/04/2022   12:39 PM 09/30/2022   10:56 AM 07/26/2022   11:18 AM 03/30/2022   12:19 PM 09/29/2021    1:03 PM  Depression screen PHQ 2/9  Decreased Interest 1 0 3 3 0  Down, Depressed, Hopeless 2 3 2 3  0  PHQ - 2 Score 3 3 5 6  0   Altered sleeping 3 2 3 2  0  Tired, decreased energy 3 2 3 3  0  Change in appetite 2 0 -- 0 0  Feeling bad or failure about yourself  2 2 0 0 0  Trouble concentrating 0 0 0 0 0  Moving slowly or fidgety/restless 0 0 0 1 0  Suicidal thoughts 0 0 0 0 0  PHQ-9 Score 13 9 11 12  0  Difficult doing work/chores Very difficult Extremely dIfficult Very difficult Extremely dIfficult Not difficult at all   Takes gabapentin 600 mg at bedtime    Patient Active Problem List   Diagnosis Date Noted   Depressed mood 10/04/2022   Left flank pain 07/31/2022   Petechiae 07/26/2022   Globus sensation 07/26/2022   Viral conjunctivitis 03/30/2022   Left shoulder pain 08/14/2021   Occult blood positive stool 05/25/2021   Colon cancer screening 05/07/2021   Estrogen deficiency 05/07/2021   Rheumatoid arthritis (HCC) 03/31/2021   Coronary artery calcification 03/31/2021   Hypokalemia 08/27/2020   Joint pain in both hands 10/12/2019   Head pain 08/22/2019   Elevated serum  creatinine 04/24/2019   Frequent urination 04/20/2019   Decreased appetite 04/20/2019   Encounter for screening mammogram for breast cancer 04/20/2019   Seborrheic keratosis, inflamed 12/15/2017   B12 deficiency 08/24/2017   Left low back pain 08/24/2017   Leg lesion 04/05/2017   Erosive osteoarthritis of both hands 09/09/2016   Fatigue 09/07/2016   Bilateral hand pain 09/07/2016   Routine general medical examination at a health care facility 05/06/2015   Large breasts 05/06/2015   Lumbar disc disease 11/01/2014   Knee pain, bilateral 03/11/2014   Thoracic back pain 11/30/2013   Encounter for routine gynecological examination 01/31/2013   Encounter for Medicare annual wellness exam 01/23/2013   Chest pain 09/20/2012   Dyspnea 09/13/2012   Asthmatic bronchitis , chronic 12/06/2011   Chronic cough 11/22/2011   Anxiety disorder 07/17/2009   HEMORRHOIDS-EXTERNAL 11/07/2008   IRRITABLE BOWEL SYNDROME 11/07/2008   Lung nodule  10/14/2008   Prediabetes 10/14/2008   PERSONAL HX COLONIC POLYPS 10/08/2008   Vitamin D deficiency 07/23/2008   SYNCOPE, HX OF 06/01/2008   FOOT SURGERY, HX OF 06/01/2008   DILATION AND CURETTAGE, HX OF 06/01/2008   OSTEOARTHRITIS, SHOULDER, RIGHT 03/22/2008   ROTATOR CUFF SYNDROME, RIGHT 03/22/2008   ARTHRALGIA 12/12/2007   Allergic rhinitis 08/01/2007   Former smoker 10/17/2006   Peripheral neuropathy 10/17/2006   FOOT PAIN, BILATERAL 10/17/2006   HYPOGLYCEMIA 09/30/2006   HYPERCHOLESTEROLEMIA 09/30/2006   MULTIPLE SCLEROSIS 09/30/2006   GERD 09/30/2006   FIBROCYSTIC BREAST DISEASE 09/30/2006   Dizziness 09/30/2006   Past Medical History:  Diagnosis Date   Allergic rhinitis    Allergy    Anxiety    Councelor- Evalina Field, no per pt   Cataract    Colon polyps 08/2008   Adenomatous   COPD (chronic obstructive pulmonary disease) (HCC)    Depression    no per pt   Diabetes mellitus    Gastritis 08/2008   H. pylori   GERD (gastroesophageal reflux disease)    Hypercholesteremia    Lung nodule    Menopausal disorder    Multiple sclerosis (HCC)    Neuromuscular disorder (HCC)    Plantar fasciitis    Seizures (HCC)    1 seizure in 2001 due to blood sugar dropping to 38, none since   Shingles    Left V1   Urinary incontinence    Vertigo    Past Surgical History:  Procedure Laterality Date   CATARACT EXTRACTION Bilateral 2023   CERVICAL DISCECTOMY  03/08/2002   x 2, Anterior; Fusion C4-5, C5-6, C6-7, Dr. Donalee Citrin   CHEST CT  11/06/2008   With small 4 mm nodule L lung base (rec re check in 1 year)   CHEST CT  07/06/2009   Re check chest CT - lung nodule stable   CHOLECYSTECTOMY     COLONOSCOPY  08/06/2008   Polyps/ re check 5 yrs   CT SINUS LTD W/O CM  02/05/2009   negative   DILATION AND CURETTAGE OF UTERUS     ESOPHAGOGASTRODUODENOSCOPY  08/06/2008   Erosive gastritis, h pylori (treated)   FOOT SURGERY     left plantar fascial problem   ROTATOR CUFF  REPAIR     right   TONSILLECTOMY     TUBAL LIGATION     UPPER GASTROINTESTINAL ENDOSCOPY     Social History   Tobacco Use   Smoking status: Former    Current packs/day: 0.00    Average packs/day: 2.0 packs/day for 30.0 years (60.0 ttl  pk-yrs)    Types: Cigarettes    Start date: 05/07/1991    Quit date: 05/06/2021    Years since quitting: 1.4   Smokeless tobacco: Never  Vaping Use   Vaping status: Never Used  Substance Use Topics   Alcohol use: No    Alcohol/week: 0.0 standard drinks of alcohol   Drug use: No   Family History  Problem Relation Age of Onset   Migraines Mother    Heart attack Mother    Coronary artery disease Mother    Coronary artery disease Father        6 bypasses    Stroke Father    Diabetes Father    Renal Disease Father    Colon cancer Maternal Grandfather 10   Coronary artery disease Cousin    Coronary artery disease Paternal Grandmother    Lung cancer Paternal Aunt    Multiple sclerosis Neg Hx    Esophageal cancer Neg Hx    Rectal cancer Neg Hx    Stomach cancer Neg Hx    Breast cancer Neg Hx    Allergies  Allergen Reactions   Atorvastatin Other (See Comments)    elevated LFT's   Percocet [Oxycodone-Acetaminophen] Shortness Of Breath    Felt like going to pass out and sweating   Tecfidera [Dimethyl Fumarate] Shortness Of Breath, Palpitations, Rash and Cough   Amitriptyline Hcl Itching and Swelling   Betaseron [Interferon Beta-1b] Other (See Comments)    Headache   Duloxetine Other (See Comments)    pain   Pregabalin Other (See Comments)    LE swelling   Zoloft [Sertraline Hcl] Other (See Comments)    Headaches    Crestor [Rosuvastatin] Other (See Comments)    Muscle aches and cramps    Clarithromycin Other (See Comments)    reaction not known   Levofloxacin Rash   Pseudoephedrine Other (See Comments)    legs hurt   Current Outpatient Medications on File Prior to Visit  Medication Sig Dispense Refill   albuterol (VENTOLIN HFA)  108 (90 Base) MCG/ACT inhaler INHALE 2 PUFFS UP TO EVERY 4 HOURS AS NEEDED FOR WHEEZING 3 each 3   ALPRAZolam (XANAX) 0.5 MG tablet Take 1 tablet (0.5 mg total) by mouth 2 (two) times daily as needed. For anxiety. 30 tablet 0   aspirin EC 81 MG tablet Take 1 tablet (81 mg total) by mouth daily. Swallow whole. 90 tablet 3   budesonide-formoterol (SYMBICORT) 160-4.5 MCG/ACT inhaler Inhale 2 puffs into the lungs 2 (two) times daily. 3 each 2   cholecalciferol (VITAMIN D3) 25 MCG (1000 UNIT) tablet Take 1,000 Units by mouth daily.     famotidine (PEPCID) 20 MG tablet TAKE 1 TABLET BY MOUTH TWICE A DAY 180 tablet 0   fluticasone (FLONASE) 50 MCG/ACT nasal spray Place 2 sprays into both nostrils daily. 16 g 11   gabapentin (NEURONTIN) 300 MG capsule Take 600 mg by mouth at bedtime.     ipratropium-albuterol (DUONEB) 0.5-2.5 (3) MG/3ML SOLN TAKE 3 MLS BY NEBULIZATION EVERY 6 (SIX) HOURS AS NEEDED. 360 mL 0   Multiple Vitamin (MULTIVITAMIN) tablet Take 1 tablet by mouth daily.     Omega-3 Fatty Acids (FISH OIL PO) Take 1 capsule by mouth daily.     Spacer/Aero-Holding Chambers (AEROCHAMBER MV) inhaler Use as instructed 1 each 0   No current facility-administered medications on file prior to visit.    Review of Systems  Constitutional:  Positive for fatigue and fever. Negative for activity change,  appetite change and unexpected weight change.  HENT:  Negative for congestion, ear pain, rhinorrhea, sinus pressure and sore throat.   Eyes:  Negative for pain, redness and visual disturbance.  Respiratory:  Negative for cough, shortness of breath and wheezing.   Cardiovascular:  Negative for chest pain and palpitations.  Gastrointestinal:  Negative for abdominal pain, blood in stool, constipation and diarrhea.  Endocrine: Negative for polydipsia and polyuria.  Genitourinary:  Negative for dysuria, frequency and urgency.  Musculoskeletal:  Negative for arthralgias, back pain and myalgias.  Skin:  Negative  for pallor and rash.  Allergic/Immunologic: Negative for environmental allergies.  Neurological:  Negative for dizziness, syncope and headaches.  Hematological:  Negative for adenopathy. Does not bruise/bleed easily.  Psychiatric/Behavioral:  Positive for dysphoric mood and sleep disturbance. Negative for confusion, decreased concentration, self-injury and suicidal ideas. The patient is not nervous/anxious.        Some anger and irritability        Objective:   Physical Exam Constitutional:      General: She is not in acute distress.    Appearance: Normal appearance. She is obese. She is not ill-appearing.  Eyes:     Conjunctiva/sclera: Conjunctivae normal.     Pupils: Pupils are equal, round, and reactive to light.  Cardiovascular:     Rate and Rhythm: Normal rate and regular rhythm.  Pulmonary:     Effort: Pulmonary effort is normal. No respiratory distress.  Skin:    General: Skin is warm and dry.  Neurological:     Mental Status: She is alert.     Cranial Nerves: No cranial nerve deficit.     Motor: No weakness.     Coordination: Coordination normal.     Gait: Gait normal.  Psychiatric:        Attention and Perception: Attention normal.        Mood and Affect: Mood is depressed. Affect is not labile.        Speech: Speech normal.        Behavior: Behavior normal.        Thought Content: Thought content does not include suicidal ideation.        Cognition and Memory: Cognition and memory normal.     Comments: Candidly discusses symptoms and stressors             Assessment & Plan:   Problem List Items Addressed This Visit       Other   Depressed mood - Primary    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  Stressors noted (loss/ marital issues/ health issues)  Mostly vegetative/sadness  and no current SI  Some irritability and anger but not feeling outwardly anxious  Reviewed stressors/ coping techniques/symptoms/ support sources/ tx options and side effects in  detail today  May want to try counseling later but not yet  Has support in some church friends  Encouraged good self care   Noted past intol to duloxetine, zoloft and possible allergy to amitriptyline   Dise options Will try generic lexapro 10 mg daily in evening / may also help wit sleep Discussed expectations of SSRI medication including time to effectiveness and mechanism of action, also poss of side effects (early and late)- including mental fuzziness, weight or appetite change, nausea and poss of worse dep or anxiety (even suicidal thoughts)  Pt voiced understanding and will stop med and update if this occurs   Follow up plan 4-6 wk or earlier if needed   Call back and  Er precautions noted in detail today  Encouraged to go to ER if she develops desire to harm herself   60  Minutes were spent today both face to face and in the chart obtaining history, reviewing records and test results , educating and discussing treatment options, and placing orders

## 2022-10-13 DIAGNOSIS — F419 Anxiety disorder, unspecified: Secondary | ICD-10-CM | POA: Diagnosis not present

## 2022-10-13 DIAGNOSIS — J4489 Other specified chronic obstructive pulmonary disease: Secondary | ICD-10-CM | POA: Diagnosis not present

## 2022-10-13 DIAGNOSIS — E1142 Type 2 diabetes mellitus with diabetic polyneuropathy: Secondary | ICD-10-CM | POA: Diagnosis not present

## 2022-10-13 DIAGNOSIS — M1990 Unspecified osteoarthritis, unspecified site: Secondary | ICD-10-CM | POA: Diagnosis not present

## 2022-10-13 DIAGNOSIS — R32 Unspecified urinary incontinence: Secondary | ICD-10-CM | POA: Diagnosis not present

## 2022-10-13 DIAGNOSIS — I10 Essential (primary) hypertension: Secondary | ICD-10-CM | POA: Diagnosis not present

## 2022-10-13 DIAGNOSIS — M545 Low back pain, unspecified: Secondary | ICD-10-CM | POA: Diagnosis not present

## 2022-10-13 DIAGNOSIS — Z008 Encounter for other general examination: Secondary | ICD-10-CM | POA: Diagnosis not present

## 2022-10-13 DIAGNOSIS — Z79899 Other long term (current) drug therapy: Secondary | ICD-10-CM | POA: Diagnosis not present

## 2022-10-13 DIAGNOSIS — J301 Allergic rhinitis due to pollen: Secondary | ICD-10-CM | POA: Diagnosis not present

## 2022-10-13 DIAGNOSIS — K219 Gastro-esophageal reflux disease without esophagitis: Secondary | ICD-10-CM | POA: Diagnosis not present

## 2022-10-13 DIAGNOSIS — M810 Age-related osteoporosis without current pathological fracture: Secondary | ICD-10-CM | POA: Diagnosis not present

## 2022-10-20 DIAGNOSIS — R221 Localized swelling, mass and lump, neck: Secondary | ICD-10-CM | POA: Diagnosis not present

## 2022-10-20 DIAGNOSIS — R220 Localized swelling, mass and lump, head: Secondary | ICD-10-CM | POA: Diagnosis not present

## 2022-10-20 DIAGNOSIS — R1314 Dysphagia, pharyngoesophageal phase: Secondary | ICD-10-CM | POA: Diagnosis not present

## 2022-10-21 ENCOUNTER — Encounter (INDEPENDENT_AMBULATORY_CARE_PROVIDER_SITE_OTHER): Payer: Self-pay

## 2022-10-26 ENCOUNTER — Other Ambulatory Visit: Payer: Self-pay | Admitting: Family Medicine

## 2022-11-01 ENCOUNTER — Telehealth: Payer: Self-pay | Admitting: Family Medicine

## 2022-11-01 ENCOUNTER — Other Ambulatory Visit: Payer: Self-pay | Admitting: Otolaryngology

## 2022-11-01 DIAGNOSIS — J4489 Other specified chronic obstructive pulmonary disease: Secondary | ICD-10-CM

## 2022-11-01 DIAGNOSIS — R1314 Dysphagia, pharyngoesophageal phase: Secondary | ICD-10-CM

## 2022-11-01 MED ORDER — BUDESONIDE-FORMOTEROL FUMARATE 160-4.5 MCG/ACT IN AERO: 2.0000 | INHALATION_SPRAY | Freq: Two times a day (BID) | RESPIRATORY_TRACT | 1 refills | Status: AC

## 2022-11-01 MED ORDER — ALBUTEROL SULFATE HFA 108 (90 BASE) MCG/ACT IN AERS
INHALATION_SPRAY | RESPIRATORY_TRACT | 1 refills | Status: DC
Start: 2022-11-01 — End: 2023-02-22

## 2022-11-01 NOTE — Telephone Encounter (Signed)
Prescription Request  11/01/2022  LOV: 10/04/2022  What is the name of the medication or equipment? albuterol (VENTOLIN HFA) 108 (90 Base) MCG/ACT inhaler  budesonide-formoterol (SYMBICORT) 160-4.5 MCG/ACT inhaler  Have you contacted your pharmacy to request a refill? No   Which pharmacy would you like this sent to?  [id number] [place], [place] - [address] [place] [address] Phone: [phone] Fax: [phone]    Patient notified that their request is being sent to the clinical staff for review and that they should receive a response within 2 business days.   Please advise at Mobile [phone] (mobile)

## 2022-11-04 ENCOUNTER — Encounter: Payer: Self-pay | Admitting: Adult Health

## 2022-11-04 ENCOUNTER — Telehealth: Payer: Self-pay | Admitting: Adult Health

## 2022-11-04 ENCOUNTER — Ambulatory Visit: Payer: Medicare HMO | Admitting: Adult Health

## 2022-11-04 VITALS — BP 130/82 | HR 70 | Ht 60.0 in | Wt 187.0 lb

## 2022-11-04 DIAGNOSIS — G35 Multiple sclerosis: Secondary | ICD-10-CM

## 2022-11-04 NOTE — Telephone Encounter (Signed)
Message from [place] M: let her know that you reach out to the MRI facilities and the wire cage over her face has to be used.  Therefore I have not ordered an MRI for her.  ([facility])

## 2022-11-04 NOTE — Progress Notes (Signed)
PATIENT: Karina [name] DOB: [birth year]  REASON FOR VISIT: follow up HISTORY FROM: patient PRIMARY NEUROLOGIST: Dr. Epimenio Foot  HISTORY OF PRESENT ILLNESS: Today 11/04/22  Karina [name] is a 67 y.o. female who has been followed in this office for multiple sclerosis. Returns today for follow-up.  She is not on any disease modifying therapy.  At the last visit she was given a prescription was given for provigil- she never took. Feels that fatigue is manageable.  She was also given a prescription for oxybutynin for urinary incontinence however she feels that this is stable as well.  She states that she only has urinary incontinence that she last.  She does not feel that she needs medication.  Denies any new symptoms.  No new numbness weakness.  No changes in gait or balance.  No visual changes.  She was unable to do an MRI because she cannot tolerate the wire cage going over her face.  She states that she consul he has to cough and spit up phlegm.   HISTORY  had the pleasure of seeing Karina [name] at [facility] for her multiple sclerosis.   She is a 67 year old woman who was diagnosed with MS in 1996.  She has not been on any disease modifying therapy since 2012.   MS History: She was diagnosed in 1996 after presenting with syncope.   She had an MRI that was c/w MS and then had a LP.   CSF was reportedly c/w MS.  She saw Dr. Sandria Manly initially and was placed on Betaseron.  She had a lot of knots in her skin so went on Tecfidera aroud 2012 but felt weaker and had reduced BP so it was stopped.   She has not been on a DMT since.   Currently, she reports a lot of issues, some related to her MS.   She tires out as she walks further so feels she could just do 1/2 mile.   She denies weakness in individual muscles but feels muscles tire out quickly.  She has spasms, cramps and myalgias.    She has tingling in her feet when she walks.  She has had reduce vision OD for many years.   She was  told the right eye has a ' football shape' (astigmatism?)   She denies difficulty with color vision.   No diplopia.    She has stress incontinence so wears pads.   She has never bene on a medication for her bladder.   She has fatigue.  Mood is fine.   Cognition is mildly impared with soe STm issues, especially names.   She has insomnia, helped only a bit by gabapentin.      She notes a mild tremor in her hands   She has a lot of phlegm and has had difficulty laying down for the MRI. The "cage" coil made it difficult for her to do and she pulle dout quickly before imageing could be done last 2 times.       REVIEW OF SYSTEMS: Out of a complete 14 system review of symptoms, the patient complains only of the following symptoms, and all other reviewed systems are negative.  ALLERGIES: Allergies  Allergen Reactions   Atorvastatin Other (See Comments)    elevated LFT's   Percocet [Oxycodone-Acetaminophen] Shortness Of Breath    Felt like going to pass out and sweating   Tecfidera [Dimethyl Fumarate] Shortness Of Breath, Palpitations, Rash and Cough   Amitriptyline Hcl Itching and  Swelling   Betaseron [Interferon Beta-1b] Other (See Comments)    Headache   Duloxetine Other (See Comments)    pain   Pregabalin Other (See Comments)    LE swelling   Zoloft [Sertraline Hcl] Other (See Comments)    Headaches    Crestor [Rosuvastatin] Other (See Comments)    Muscle aches and cramps to generic crestor and other statins   Clarithromycin Other (See Comments)    reaction not known   Levofloxacin Rash   Pseudoephedrine Other (See Comments)    legs hurt    HOME MEDICATIONS: Outpatient Medications Prior to Visit  Medication Sig Dispense Refill   albuterol (VENTOLIN HFA) 108 (90 Base) MCG/ACT inhaler INHALE 2 PUFFS UP TO EVERY 4 HOURS AS NEEDED FOR WHEEZING 3 each 1   ALPRAZolam (XANAX) 0.5 MG tablet Take 1 tablet (0.5 mg total) by mouth 2 (two) times daily as needed. For anxiety. 30 tablet 0    aspirin EC 81 MG tablet Take 1 tablet (81 mg total) by mouth daily. Swallow whole. 90 tablet 3   budesonide-formoterol (SYMBICORT) 160-4.5 MCG/ACT inhaler Inhale 2 puffs into the lungs 2 (two) times daily. 3 each 1   cholecalciferol (VITAMIN D3) 25 MCG (1000 UNIT) tablet Take 1,000 Units by mouth daily.     famotidine (PEPCID) 20 MG tablet TAKE 1 TABLET BY MOUTH TWICE A DAY 180 tablet 0   fluticasone (FLONASE) 50 MCG/ACT nasal spray Place 2 sprays into both nostrils daily. 16 g 11   gabapentin (NEURONTIN) 300 MG capsule Take 600 mg by mouth at bedtime.     ipratropium-albuterol (DUONEB) 0.5-2.5 (3) MG/3ML SOLN TAKE 3 MLS BY NEBULIZATION EVERY 6 (SIX) HOURS AS NEEDED. 360 mL 0   Multiple Vitamin (MULTIVITAMIN) tablet Take 1 tablet by mouth daily.     nicotine (NICODERM CQ - DOSED IN MG/24 HOURS) 21 mg/24hr patch Place 21 mg onto the skin daily.     Omega-3 Fatty Acids (FISH OIL PO) Take 1 capsule by mouth daily.     Spacer/Aero-Holding Chambers (AEROCHAMBER MV) inhaler Use as instructed 1 each 0   escitalopram (LEXAPRO) 10 MG tablet TAKE 1 TABLET (10 MG TOTAL) BY MOUTH DAILY. IN THE EVENING (Patient not taking: Reported on 11/04/2022) 90 tablet 0   No facility-administered medications prior to visit.    PAST MEDICAL HISTORY: Past Medical History:  Diagnosis Date   Allergic rhinitis    Allergy    Anxiety    Councelor- Evalina Field, no per pt   Cataract    Colon polyps 08/2008   Adenomatous   COPD (chronic obstructive pulmonary disease) (HCC)    Depression    no per pt   Diabetes mellitus    Gastritis 08/2008   H. pylori   GERD (gastroesophageal reflux disease)    Hypercholesteremia    Lung nodule    Menopausal disorder    Multiple sclerosis (HCC)    Neuromuscular disorder (HCC)    Plantar fasciitis    Seizures (HCC)    1 seizure in 2001 due to blood sugar dropping to 38, none since   Shingles    Left V1   Urinary incontinence    Vertigo     PAST SURGICAL HISTORY: Past  Surgical History:  Procedure Laterality Date   CATARACT EXTRACTION Bilateral 2023   CERVICAL DISCECTOMY  03/08/2002   x 2, Anterior; Fusion C4-5, C5-6, C6-7, Dr. Donalee Citrin   CHEST CT  11/06/2008   With small 4 mm nodule L lung base (  rec re check in 1 year)   CHEST CT  07/06/2009   Re check chest CT - lung nodule stable   CHOLECYSTECTOMY     COLONOSCOPY  08/06/2008   Polyps/ re check 5 yrs   CT SINUS LTD W/O CM  02/05/2009   negative   DILATION AND CURETTAGE OF UTERUS     ESOPHAGOGASTRODUODENOSCOPY  08/06/2008   Erosive gastritis, h pylori (treated)   FOOT SURGERY     left plantar fascial problem   ROTATOR CUFF REPAIR     right   TONSILLECTOMY     TUBAL LIGATION     UPPER GASTROINTESTINAL ENDOSCOPY      FAMILY HISTORY: Family History  Problem Relation Age of Onset   Migraines Mother    Heart attack Mother    Coronary artery disease Mother    Coronary artery disease Father        6 bypasses    Stroke Father    Diabetes Father    Renal Disease Father    Colon cancer Maternal Grandfather 18   Coronary artery disease Cousin    Coronary artery disease Paternal Grandmother    Lung cancer Paternal Aunt    Multiple sclerosis Neg Hx    Esophageal cancer Neg Hx    Rectal cancer Neg Hx    Stomach cancer Neg Hx    Breast cancer Neg Hx     SOCIAL HISTORY: Social History   Socioeconomic History   Marital status: Married    Spouse name: Not on file   Number of children: 3   Years of education: 9 th   Highest education level: Not on file  Occupational History   Occupation: Disabled     Employer: DISABLED  Tobacco Use   Smoking status: Former    Current packs/day: 0.00    Average packs/day: 2.0 packs/day for 30.0 years (60.0 ttl pk-yrs)    Types: Cigarettes    Start date: 05/07/1991    Quit date: 05/06/2021    Years since quitting: 1.4   Smokeless tobacco: Never  Vaping Use   Vaping status: Never Used  Substance and Sexual Activity   Alcohol use: No    Alcohol/week:  0.0 standard drinks of alcohol   Drug use: No   Sexual activity: Not on file  Other Topics Concern   Not on file  Social History Narrative   Married with 3 children   Disabled.   Education 9th grade    Caffeine one cup of coffee daily.   Patient is right handed.    Social Determinants of Health   Financial Resource Strain: Low Risk  (09/30/2022)   Overall Financial Resource Strain (CARDIA)    Difficulty of Paying Living Expenses: Not hard at all  Food Insecurity: No Food Insecurity (09/30/2022)   Hunger Vital Sign    Worried About Running Out of Food in the Last Year: Never true    Ran Out of Food in the Last Year: Never true  Transportation Needs: No Transportation Needs (09/30/2022)   PRAPARE - Administrator, Civil Service (Medical): No    Lack of Transportation (Non-Medical): No  Physical Activity: Inactive (09/30/2022)   Exercise Vital Sign    Days of Exercise per Week: 0 days    Minutes of Exercise per Session: 0 min  Stress: Stress Concern Present (09/30/2022)   [employer] of Occupational Health - Occupational Stress Questionnaire    Feeling of Stress : Very much  Social Connections: Moderately Isolated (  09/30/2022)   Social Connection and Isolation Panel [NHANES]    Frequency of Communication with Friends and Family: Never    Frequency of Social Gatherings with Friends and Family: Never    Attends Religious Services: More than 4 times per year    Active Member of [employer] or Organizations: No    Attends Banker Meetings: Never    Marital Status: Married  Catering manager Violence: Not At Risk (09/30/2022)   Humiliation, Afraid, Rape, and Kick questionnaire    Fear of Current or Ex-Partner: No    Emotionally Abused: No    Physically Abused: No    Sexually Abused: No      PHYSICAL EXAM  Vitals:   11/04/22 1428  BP: 130/82  Pulse: 70  Weight: 187 lb (84.8 kg)  Height: 5' (1.524 m)   Body mass index is 36.52 kg/m.  Generalized:  Well developed, in no acute distress   Neurological examination  Mentation: Alert oriented to time, place, history taking. Follows all commands speech and language fluent Cranial nerve II-XII: Pupils were equal round reactive to light. Extraocular movements were full, visual field were full on confrontational test. Facial sensation and strength were normal. Head turning and shoulder shrug  were normal and symmetric. Motor: The motor testing reveals 5 over 5 strength of all 4 extremities. Good symmetric motor tone is noted throughout.  Sensory: Sensory testing is intact to soft touch on all 4 extremities. No evidence of extinction is noted.  Coordination: Cerebellar testing reveals good finger-nose-finger and heel-to-shin bilaterally.  Gait and station: Gait is normal. Tandem gait is normal. Romberg is negative. No drift is seen.  Reflexes: Deep tendon reflexes are symmetric and normal bilaterally.   DIAGNOSTIC DATA (LABS, IMAGING, TESTING) - I reviewed patient records, labs, notes, testing and imaging myself where available.  Lab Results  Component Value Date   WBC 5.8 07/26/2022   HGB 13.7 07/26/2022   HCT 41.6 07/26/2022   MCV 94.3 07/26/2022   PLT 237.0 07/26/2022      Component Value Date/Time   NA 139 07/26/2022 1203   NA 136 02/23/2021 1041   NA 136 07/09/2013 1803   K 4.1 07/26/2022 1203   K 4.2 07/09/2013 1803   CL 102 07/26/2022 1203   CL 105 07/09/2013 1803   CO2 28 07/26/2022 1203   CO2 22 07/09/2013 1803   GLUCOSE 120 (H) 07/26/2022 1203   GLUCOSE 96 07/09/2013 1803   BUN 10 07/26/2022 1203   BUN 7 (L) 02/23/2021 1041   BUN 16 07/09/2013 1803   CREATININE 1.07 07/26/2022 1203   CREATININE 1.20 (H) 04/20/2019 1444   CALCIUM 9.8 07/26/2022 1203   CALCIUM 9.6 07/09/2013 1803   PROT 7.4 07/26/2022 1203   PROT 6.9 11/21/2014 1137   ALBUMIN 4.2 07/26/2022 1203   ALBUMIN 4.3 11/21/2014 1137   AST 19 07/26/2022 1203   ALT 15 07/26/2022 1203   ALKPHOS 67 07/26/2022  1203   BILITOT 0.4 07/26/2022 1203   BILITOT 0.3 11/21/2014 1137   GFRNONAA >60 08/18/2020 0835   GFRNONAA 55 (L) 07/09/2013 1803   GFRAA >60 08/22/2019 1602   GFRAA >60 07/09/2013 1803   Lab Results  Component Value Date   CHOL 267 (H) 07/26/2022   HDL 77.10 07/26/2022   LDLCALC 170 (H) 07/26/2022   LDLDIRECT 206.5 01/24/2013   TRIG 101.0 07/26/2022   CHOLHDL 3 07/26/2022   Lab Results  Component Value Date   HGBA1C 5.8 07/26/2022   Lab  Results  Component Value Date   VITAMINB12 259 05/07/2021   Lab Results  Component Value Date   TSH 2.84 07/26/2022      ASSESSMENT AND PLAN 67 y.o. year old female  has a past medical history of Allergic rhinitis, Allergy, Anxiety, Cataract, Colon polyps (08/2008), COPD (chronic obstructive pulmonary disease) (HCC), Depression, Diabetes mellitus, Gastritis (08/2008), GERD (gastroesophageal reflux disease), Hypercholesteremia, Lung nodule, Menopausal disorder, Multiple sclerosis (HCC), Neuromuscular disorder (HCC), Plantar fasciitis, Seizures (HCC), Shingles, Urinary incontinence, and Vertigo. here with:  Multiple sclerosis  Overall she is doing well.  Not on any disease modifying therapy.  We discussed repeating MRI however she cannot tolerate the wire cage over her face.  I did have her MRI coordinator reach out to local facilities to see if this had to be used and it does.  Will continue to monitor symptoms.  She is advised if symptoms worsen or she develops new symptoms she should let us know.  Follow-up in 6 months or sooner if needed     Butch Penny, MSN, NP-C 11/04/2022, 2:49 PM [facility] [address], [address] [place], [place] [address] [phone]

## 2022-11-05 ENCOUNTER — Ambulatory Visit: Admission: RE | Admit: 2022-11-05 | Payer: Medicare HMO | Source: Ambulatory Visit

## 2022-11-05 DIAGNOSIS — R1314 Dysphagia, pharyngoesophageal phase: Secondary | ICD-10-CM | POA: Diagnosis not present

## 2022-11-05 DIAGNOSIS — G35 Multiple sclerosis: Secondary | ICD-10-CM | POA: Diagnosis not present

## 2022-11-15 ENCOUNTER — Ambulatory Visit (INDEPENDENT_AMBULATORY_CARE_PROVIDER_SITE_OTHER): Payer: Medicare HMO | Admitting: Family Medicine

## 2022-11-15 ENCOUNTER — Encounter: Payer: Self-pay | Admitting: Family Medicine

## 2022-11-15 VITALS — BP 135/81 | HR 74 | Temp 97.9°F | Ht 60.0 in | Wt 186.2 lb

## 2022-11-15 DIAGNOSIS — J0100 Acute maxillary sinusitis, unspecified: Secondary | ICD-10-CM

## 2022-11-15 DIAGNOSIS — R4589 Other symptoms and signs involving emotional state: Secondary | ICD-10-CM

## 2022-11-15 MED ORDER — AMOXICILLIN-POT CLAVULANATE 875-125 MG PO TABS
1.0000 | ORAL_TABLET | Freq: Two times a day (BID) | ORAL | 0 refills | Status: DC
Start: 2022-11-15 — End: 2023-01-31

## 2022-11-15 NOTE — Assessment & Plan Note (Signed)
Doing better since last visit with improvement in PHQ and GAD scores Decided not to take lexapro  Has a friend group from church that has helped   Reviewed stressors/ coping techniques/symptoms/ support sources/ tx options and side effects in detail today Continue to encouraged good self care Consider counseling in future if needed

## 2022-11-15 NOTE — Progress Notes (Signed)
Subjective:    Patient ID: Karina [name], female    DOB: [birth year], 67 y.o.   MRN: [id number]  HPI  Wt Readings from Last 3 Encounters:  11/15/22 186 lb 4 oz (84.5 kg)  11/04/22 187 lb (84.8 kg)  10/04/22 192 lb (87.1 kg)   36.37 kg/m  Vitals:   11/15/22 1457 11/15/22 1520  BP: (!) 144/86 135/81  Pulse: 74   Temp: 97.9 F (36.6 C)   SpO2: 96%     Pt presents for follow up of mood /chronic health problems Also ear and sinus pain   Sinus symptoms started yesterday - right ear pain  Woke up with sinus pain  Allergies worse than usual- hayfever season   Mucous is clear  Pain in cheek area   No fever  No cough  No st  No wheezing    Last visit discussed depressedmood/irritability and stressors  Reviewed failed meds/intol in the past Started lexaro 10 mg daily in evening  She got it but never used it - got some support from ladies from her church and felt better   Does not sleep well   Declined counseling /has done in the past   Past history  In past Zoloft caused side effects-headache Duloxetine caused side effects -pain  Amitriptyline caused itching and swelling  Thinks did not wellbutrin in the past (bad dreams?)   Marjiuana made her paranoid when she tried in past  May have been given lithium but unsure if dx with bipolar   Former smoker - quit in 2023      11/15/2022    3:06 PM 10/04/2022   12:39 PM 09/30/2022   10:56 AM 07/26/2022   11:18 AM 03/30/2022   12:19 PM  Depression screen PHQ 2/9  Decreased Interest 1 1 0 3 3  Down, Depressed, Hopeless 0 2 3 2 3   PHQ - 2 Score 1 3 3 5 6   Altered sleeping 1 3 2 3 2   Tired, decreased energy 3 3 2 3 3   Change in appetite 0 2 0 -- 0  Feeling bad or failure about yourself  0 2 2 0 0  Trouble concentrating 0 0 0 0 0  Moving slowly or fidgety/restless 0 0 0 0 1  Suicidal thoughts 0 0 0 0 0  PHQ-9 Score 5 13 9 11 12   Difficult doing work/chores Very difficult Very difficult Extremely dIfficult Very  difficult Extremely dIfficult      11/15/2022    3:06 PM 10/04/2022   12:39 PM 07/26/2022   11:19 AM 03/30/2022   12:19 PM  GAD 7 : Generalized Anxiety Score  Nervous, Anxious, on Edge 1 1 2 1   Control/stop worrying 0 0 0 2  Worry too much - different things 0 0 0 2  Trouble relaxing 1 2 -- 1  Restless 0 3 -- 0  Easily annoyed or irritable 2 0 3 3  Afraid - awful might happen 0 2 -- 1  Total GAD 7 Score 4 8  10   Anxiety Difficulty Somewhat difficult Extremely difficult Very difficult Very difficult     ENT put her through swallowing test      Patient Active Problem List   Diagnosis Date Noted   Depressed mood 10/04/2022   Left flank pain 07/31/2022   Petechiae 07/26/2022   Globus sensation 07/26/2022   Viral conjunctivitis 03/30/2022   Left shoulder pain 08/14/2021   Occult blood positive stool 05/25/2021   Colon cancer screening 05/07/2021  Estrogen deficiency 05/07/2021   Rheumatoid arthritis (HCC) 03/31/2021   Coronary artery calcification 03/31/2021   Hypokalemia 08/27/2020   Joint pain in both hands 10/12/2019   Head pain 08/22/2019   Elevated serum creatinine 04/24/2019   Frequent urination 04/20/2019   Decreased appetite 04/20/2019   Encounter for screening mammogram for breast cancer 04/20/2019   Seborrheic keratosis, inflamed 12/15/2017   B12 deficiency 08/24/2017   Left low back pain 08/24/2017   Leg lesion 04/05/2017   Erosive osteoarthritis of both hands 09/09/2016   Fatigue 09/07/2016   Bilateral hand pain 09/07/2016   Routine general medical examination at a health care facility 05/06/2015   Large breasts 05/06/2015   Lumbar disc disease 11/01/2014   Knee pain, bilateral 03/11/2014   Thoracic back pain 11/30/2013   Encounter for routine gynecological examination 01/31/2013   Acute sinusitis 01/31/2013   Encounter for Medicare annual wellness exam 01/23/2013   Chest pain 09/20/2012   Dyspnea 09/13/2012   Asthmatic bronchitis , chronic 12/06/2011    Chronic cough 11/22/2011   Anxiety disorder 07/17/2009   HEMORRHOIDS-EXTERNAL 11/07/2008   IRRITABLE BOWEL SYNDROME 11/07/2008   Lung nodule 10/14/2008   Prediabetes 10/14/2008   PERSONAL HX COLONIC POLYPS 10/08/2008   Vitamin D deficiency 07/23/2008   SYNCOPE, HX OF 06/01/2008   FOOT SURGERY, HX OF 06/01/2008   DILATION AND CURETTAGE, HX OF 06/01/2008   OSTEOARTHRITIS, SHOULDER, RIGHT 03/22/2008   ROTATOR CUFF SYNDROME, RIGHT 03/22/2008   ARTHRALGIA 12/12/2007   Allergic rhinitis 08/01/2007   Former smoker 10/17/2006   Peripheral neuropathy 10/17/2006   FOOT PAIN, BILATERAL 10/17/2006   HYPOGLYCEMIA 09/30/2006   HYPERCHOLESTEROLEMIA 09/30/2006   MULTIPLE SCLEROSIS 09/30/2006   GERD 09/30/2006   FIBROCYSTIC BREAST DISEASE 09/30/2006   Dizziness 09/30/2006   Past Medical History:  Diagnosis Date   Allergic rhinitis    Allergy    Anxiety    Councelor- Evalina Field, no per pt   Cataract    Colon polyps 08/2008   Adenomatous   COPD (chronic obstructive pulmonary disease) (HCC)    Depression    no per pt   Diabetes mellitus    Gastritis 08/2008   H. pylori   GERD (gastroesophageal reflux disease)    Hypercholesteremia    Lung nodule    Menopausal disorder    Multiple sclerosis (HCC)    Neuromuscular disorder (HCC)    Plantar fasciitis    Seizures (HCC)    1 seizure in 2001 due to blood sugar dropping to 38, none since   Shingles    Left V1   Urinary incontinence    Vertigo    Past Surgical History:  Procedure Laterality Date   CATARACT EXTRACTION Bilateral 2023   CERVICAL DISCECTOMY  03/08/2002   x 2, Anterior; Fusion C4-5, C5-6, C6-7, Dr. Donalee Citrin   CHEST CT  11/06/2008   With small 4 mm nodule L lung base (rec re check in 1 year)   CHEST CT  07/06/2009   Re check chest CT - lung nodule stable   CHOLECYSTECTOMY     COLONOSCOPY  08/06/2008   Polyps/ re check 5 yrs   CT SINUS LTD W/O CM  02/05/2009   negative   DILATION AND CURETTAGE OF UTERUS      ESOPHAGOGASTRODUODENOSCOPY  08/06/2008   Erosive gastritis, h pylori (treated)   FOOT SURGERY     left plantar fascial problem   ROTATOR CUFF REPAIR     right   TONSILLECTOMY     TUBAL  LIGATION     UPPER GASTROINTESTINAL ENDOSCOPY     Social History   Tobacco Use   Smoking status: Former    Current packs/day: 0.00    Average packs/day: 2.0 packs/day for 30.0 years (60.0 ttl pk-yrs)    Types: Cigarettes    Start date: 05/07/1991    Quit date: 05/06/2021    Years since quitting: 1.5   Smokeless tobacco: Never  Vaping Use   Vaping status: Never Used  Substance Use Topics   Alcohol use: No    Alcohol/week: 0.0 standard drinks of alcohol   Drug use: No   Family History  Problem Relation Age of Onset   Migraines Mother    Heart attack Mother    Coronary artery disease Mother    Coronary artery disease Father        6 bypasses    Stroke Father    Diabetes Father    Renal Disease Father    Colon cancer Maternal Grandfather 65   Coronary artery disease Cousin    Coronary artery disease Paternal Grandmother    Lung cancer Paternal Aunt    Multiple sclerosis Neg Hx    Esophageal cancer Neg Hx    Rectal cancer Neg Hx    Stomach cancer Neg Hx    Breast cancer Neg Hx    Allergies  Allergen Reactions   Atorvastatin Other (See Comments)    elevated LFT's   Percocet [Oxycodone-Acetaminophen] Shortness Of Breath    Felt like going to pass out and sweating   Tecfidera [Dimethyl Fumarate] Shortness Of Breath, Palpitations, Rash and Cough   Amitriptyline Hcl Itching and Swelling   Betaseron [Interferon Beta-1b] Other (See Comments)    Headache   Duloxetine Other (See Comments)    pain   Pregabalin Other (See Comments)    LE swelling   Zoloft [Sertraline Hcl] Other (See Comments)    Headaches    Crestor [Rosuvastatin] Other (See Comments)    Muscle aches and cramps to generic crestor and other statins   Clarithromycin Other (See Comments)    reaction not known    Levofloxacin Rash   Pseudoephedrine Other (See Comments)    legs hurt   Current Outpatient Medications on File Prior to Visit  Medication Sig Dispense Refill   albuterol (VENTOLIN HFA) 108 (90 Base) MCG/ACT inhaler INHALE 2 PUFFS UP TO EVERY 4 HOURS AS NEEDED FOR WHEEZING 3 each 1   ALPRAZolam (XANAX) 0.5 MG tablet Take 1 tablet (0.5 mg total) by mouth 2 (two) times daily as needed. For anxiety. 30 tablet 0   aspirin EC 81 MG tablet Take 1 tablet (81 mg total) by mouth daily. Swallow whole. 90 tablet 3   budesonide-formoterol (SYMBICORT) 160-4.5 MCG/ACT inhaler Inhale 2 puffs into the lungs 2 (two) times daily. 3 each 1   cholecalciferol (VITAMIN D3) 25 MCG (1000 UNIT) tablet Take 1,000 Units by mouth daily.     famotidine (PEPCID) 20 MG tablet TAKE 1 TABLET BY MOUTH TWICE A DAY 180 tablet 0   fluticasone (FLONASE) 50 MCG/ACT nasal spray Place 2 sprays into both nostrils daily. 16 g 11   gabapentin (NEURONTIN) 300 MG capsule Take 600 mg by mouth at bedtime.     ipratropium-albuterol (DUONEB) 0.5-2.5 (3) MG/3ML SOLN TAKE 3 MLS BY NEBULIZATION EVERY 6 (SIX) HOURS AS NEEDED. 360 mL 0   Multiple Vitamin (MULTIVITAMIN) tablet Take 1 tablet by mouth daily.     nicotine (NICODERM CQ - DOSED IN MG/24 HOURS) 21 mg/24hr  patch Place 21 mg onto the skin daily.     Omega-3 Fatty Acids (FISH OIL PO) Take 1 capsule by mouth daily.     predniSONE (DELTASONE) 20 MG tablet Take 20 mg by mouth daily with breakfast. For 5 days     Spacer/Aero-Holding Chambers (AEROCHAMBER MV) inhaler Use as instructed 1 each 0   No current facility-administered medications on file prior to visit.    Review of Systems  Constitutional:  Negative for activity change, appetite change, fatigue, fever and unexpected weight change.  HENT:  Positive for congestion, ear pain, postnasal drip, sinus pressure and sinus pain. Negative for ear discharge, hearing loss, nosebleeds, rhinorrhea and sore throat.   Eyes:  Negative for pain,  redness, itching and visual disturbance.  Respiratory:  Negative for cough, shortness of breath and wheezing.   Cardiovascular:  Negative for chest pain and palpitations.  Gastrointestinal:  Negative for abdominal pain, blood in stool, constipation, diarrhea, nausea and vomiting.  Endocrine: Negative for polydipsia and polyuria.  Genitourinary:  Negative for dysuria, frequency and urgency.  Musculoskeletal:  Negative for arthralgias, back pain and myalgias.  Skin:  Negative for pallor and rash.  Allergic/Immunologic: Negative for environmental allergies and immunocompromised state.  Neurological:  Negative for dizziness, tremors, syncope, weakness, numbness and headaches.  Hematological:  Negative for adenopathy. Does not bruise/bleed easily.  Psychiatric/Behavioral:  Positive for dysphoric mood and sleep disturbance. Negative for decreased concentration and suicidal ideas. The patient is nervous/anxious.        Overall mood is better       Objective:   Physical Exam Constitutional:      General: She is not in acute distress.    Appearance: Normal appearance. She is well-developed. She is obese. She is not ill-appearing.  HENT:     Head: Normocephalic and atraumatic.     Comments: Bilateral maxillary and frontal sinus tenderness    Right Ear: Tympanic membrane and external ear normal.     Left Ear: Tympanic membrane and external ear normal.     Nose: Congestion and rhinorrhea present.     Mouth/Throat:     Pharynx: Oropharynx is clear. No oropharyngeal exudate or posterior oropharyngeal erythema.     Comments: Clear pnd Eyes:     General:        Right eye: No discharge.        Left eye: No discharge.     Conjunctiva/sclera: Conjunctivae normal.     Pupils: Pupils are equal, round, and reactive to light.  Cardiovascular:     Rate and Rhythm: Normal rate and regular rhythm.  Pulmonary:     Effort: Pulmonary effort is normal. No respiratory distress.     Breath sounds: Normal  breath sounds. No wheezing or rales.     Comments: Good air exch No rales or rhonchi Musculoskeletal:     Cervical back: Normal range of motion and neck supple.  Lymphadenopathy:     Cervical: No cervical adenopathy.  Skin:    General: Skin is warm and dry.     Findings: No rash.  Neurological:     Mental Status: She is alert.     Cranial Nerves: No cranial nerve deficit.     Coordination: Coordination normal.  Psychiatric:        Mood and Affect: Mood normal.     Comments: Mood is improved Much less down Good eye contact             Assessment & Plan:  Problem List Items Addressed This Visit       Respiratory   Acute sinusitis - Primary    Sinus pain/congestion and right ear pain in setting of seasonal allergies   Will treatment with  Prednisone 20 mg daily 5 d (she has at home) Augmentin bid 7d  Update if not starting to improve in a week or if worsening   Urged to continue flonase for allergies Consider nasal saline Update if not starting to improve in a week or if worsening  Call back and Er precautions noted in detail today        Relevant Medications   predniSONE (DELTASONE) 20 MG tablet   amoxicillin-clavulanate (AUGMENTIN) 875-125 MG tablet     Other   Depressed mood    Doing better since last visit with improvement in PHQ and GAD scores Decided not to take lexapro  Has a friend group from church that has helped   Reviewed stressors/ coping techniques/symptoms/ support sources/ tx options and side effects in detail today Continue to encouraged good self care Consider counseling in future if needed

## 2022-11-15 NOTE — Patient Instructions (Addendum)
For sinus infection Take augmentin twice daily for a week  Take prednisone 20 mg once daily (in am) for 5 days  If you don't have this at home give [place] a call and I will send it in   Continue flonase  Also you can use nasal saline spray for congestion   Glad you did ok without the lexapro  Keep in contact with your friends and take care of yourself !   If you don't hear back from the ENT about the swallowing study give [place] a call

## 2022-11-15 NOTE — Assessment & Plan Note (Signed)
Sinus pain/congestion and right ear pain in setting of seasonal allergies   Will treatment with  Prednisone 20 mg daily 5 d (she has at home) Augmentin bid 7d  Update if not starting to improve in a week or if worsening   Urged to continue flonase for allergies Consider nasal saline Update if not starting to improve in a week or if worsening  Call back and Er precautions noted in detail today

## 2022-12-28 IMAGING — DX DG CHEST 1V PORT
1 series · 1 of 1 positions shown · non-contrast
Comparison: Prior chest radiographs 08/15/2020 and earlier.

CLINICAL DATA: Questionable sepsis. Evaluate for abnormality.
Sepsis, cough, shortness of breath, fever.

EXAM:
PORTABLE CHEST 1 VIEW

[chest ap]
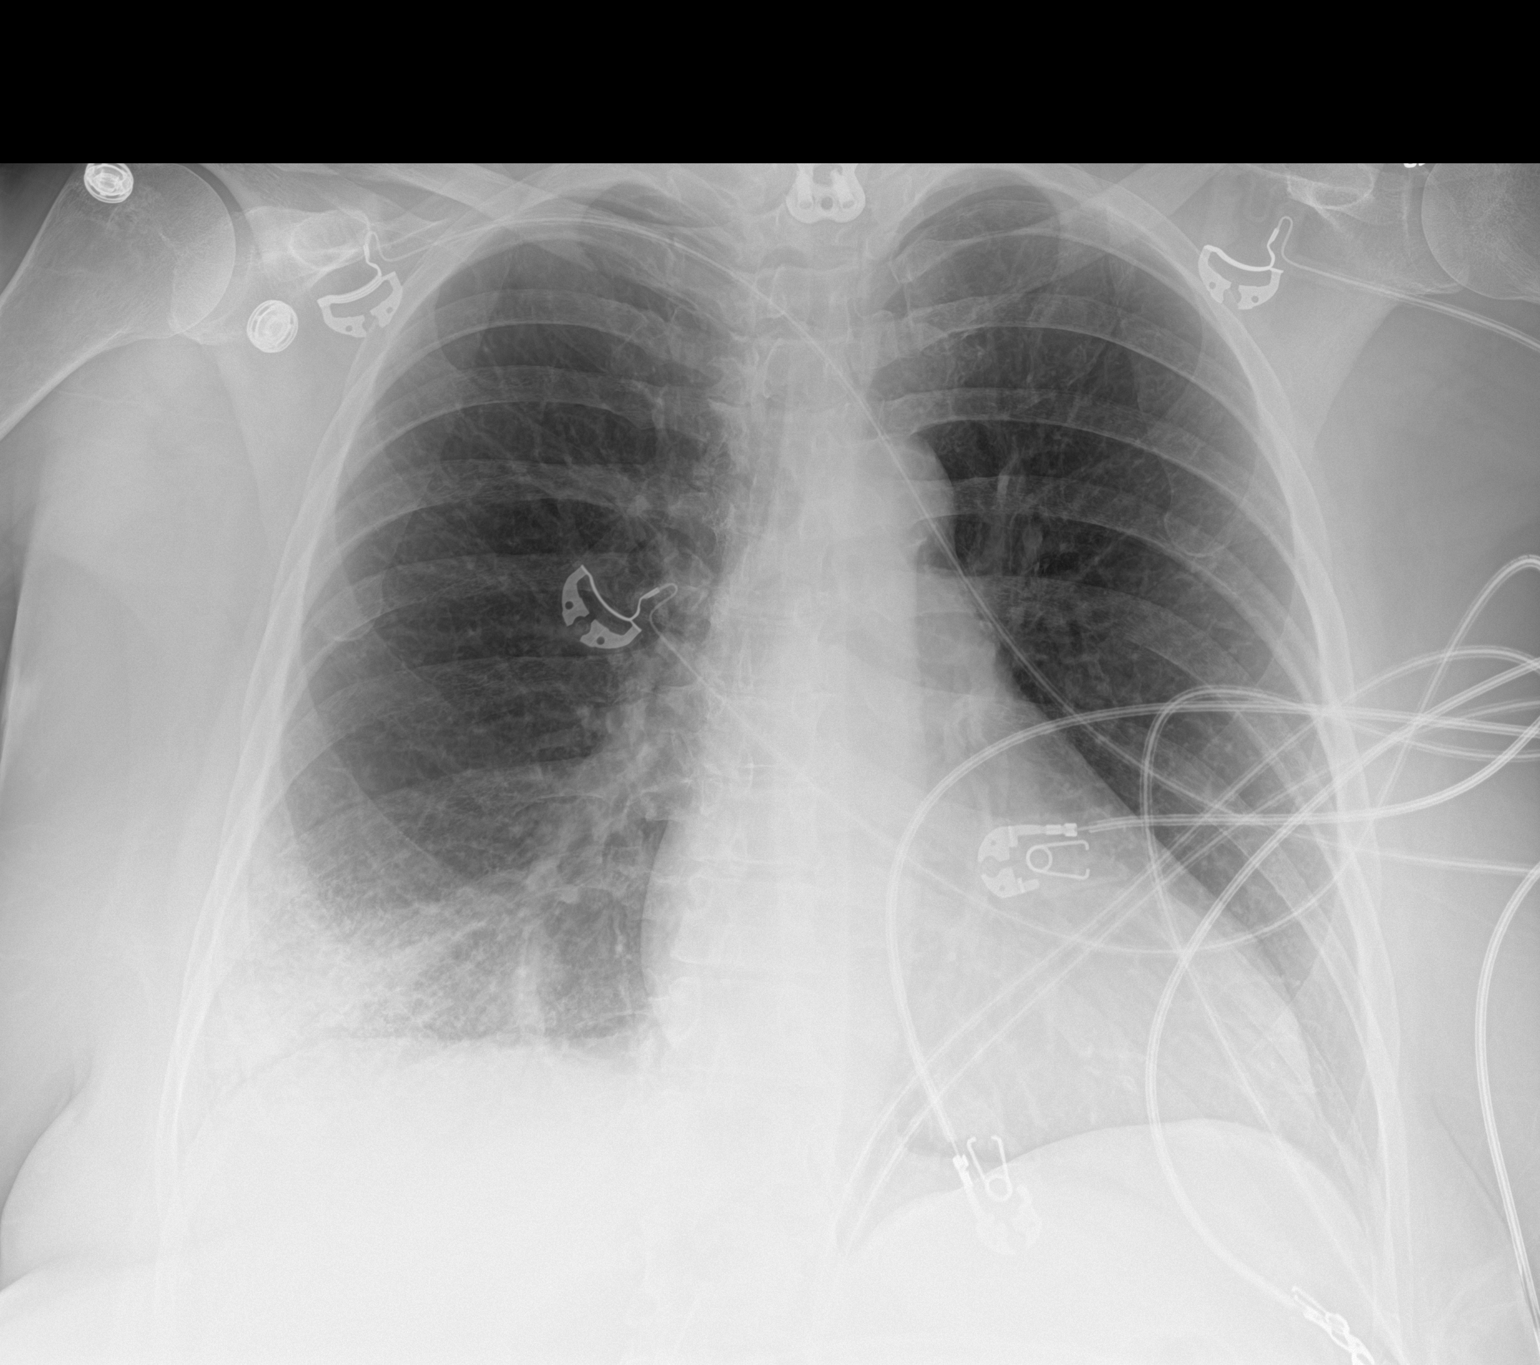

[1 of 1 positions shown; findings below may reference images not displayed]

FINDINGS: Heart size within normal limits. Right lower lobe airspace opacity
compatible with pneumonia. The left lung is clear. No evidence of
pleural effusion or pneumothorax. No acute bony abnormality
identified. Partially imaged ACDF hardware.
IMPRESSION: Right lower lobe opacity compatible with pneumonia. Followup PA and
lateral chest X-ray recommended in 3-4 weeks following trial of
antibiotic therapy to ensure resolution.

## 2023-01-31 ENCOUNTER — Encounter: Payer: Self-pay | Admitting: Emergency Medicine

## 2023-01-31 ENCOUNTER — Emergency Department
Admission: EM | Admit: 2023-01-31 | Discharge: 2023-01-31 | Disposition: A | Discharge status: Home / Self Care | Payer: Medicare HMO | Attending: Student in an Organized Health Care Education/Training Program | Admitting: Student in an Organized Health Care Education/Training Program

## 2023-01-31 ENCOUNTER — Other Ambulatory Visit: Payer: Self-pay

## 2023-01-31 DIAGNOSIS — G35 Multiple sclerosis: Secondary | ICD-10-CM | POA: Diagnosis not present

## 2023-01-31 DIAGNOSIS — J449 Chronic obstructive pulmonary disease, unspecified: Secondary | ICD-10-CM | POA: Diagnosis not present

## 2023-01-31 DIAGNOSIS — T23201A Burn of second degree of right hand, unspecified site, initial encounter: Secondary | ICD-10-CM | POA: Diagnosis not present

## 2023-01-31 DIAGNOSIS — T23101A Burn of first degree of right hand, unspecified site, initial encounter: Secondary | ICD-10-CM | POA: Diagnosis not present

## 2023-01-31 DIAGNOSIS — E119 Type 2 diabetes mellitus without complications: Secondary | ICD-10-CM | POA: Diagnosis not present

## 2023-01-31 DIAGNOSIS — T310 Burns involving less than 10% of body surface: Secondary | ICD-10-CM | POA: Insufficient documentation

## 2023-01-31 DIAGNOSIS — X153XXA Contact with hot saucepan or skillet, initial encounter: Secondary | ICD-10-CM | POA: Diagnosis not present

## 2023-01-31 NOTE — ED Triage Notes (Signed)
Patient to ED via POV for burn to left hand. States she accidentally grabbed a hot pan. No blisters noted.

## 2023-01-31 NOTE — ED Provider Notes (Signed)
[facility] Provider Note    Event Date/Time   First MD Initiated Contact with Patient 01/31/23 1723     (approximate)   History   Burn   HPI  Karina [name] is a 67 y.o. female with PMH of MS, COPD, depression, seizures, GERD, diabetes, anxiety and neuromuscular disorder presents for evaluation of a burn to the right hand.  Patient states she had a pan in the oven that she took out and put on the counter, she then removed the oven mitt and touched the hot pan handle burning herself.  She has had ice applied to the hand since this happened around 2 hours ago.     Physical Exam   Triage Vital Signs: ED Triage Vitals  Encounter Vitals Group     BP 01/31/23 1709 (!) 160/97     Systolic BP Percentile --      Diastolic BP Percentile --      Pulse Rate 01/31/23 1709 99     Resp 01/31/23 1709 18     Temp 01/31/23 1709 (!) 97.4 F (36.3 C)     Temp Source 01/31/23 1709 Oral     SpO2 01/31/23 1709 96 %     Weight 01/31/23 1707 180 lb (81.6 kg)     Height 01/31/23 1707 5' (1.524 m)     Head Circumference --      Peak Flow --      Pain Score 01/31/23 1707 10     Pain Loc --      Pain Education --      Exclude from Growth Chart --     Most recent vital signs: Vitals:   01/31/23 1709  BP: (!) 160/97  Pulse: 99  Resp: 18  Temp: (!) 97.4 F (36.3 C)  SpO2: 96%   General: Awake, no distress.  CV:  Good peripheral perfusion.  Resp:  Normal effort.  Abd:  No distention.  Other:  Burn to the palm of the hand as well as the palmar side of the fingers, there are no blisters, skin is dry and erythematous, capillary refill is appropriate, tender to palpation, full ROM of fingers maintained. Sensation intact.   ED Results / Procedures / Treatments   Labs (all labs ordered are listed, but only abnormal results are displayed) Labs Reviewed - No data to display   PROCEDURES:  Critical Care performed: No  Procedures   MEDICATIONS ORDERED IN  ED: Medications - No data to display   IMPRESSION / MDM / ASSESSMENT AND PLAN / ED COURSE  I reviewed the triage vital signs and the nursing notes.                             67 year old female presents for evaluation of burn to her right hand.  Patient was hypertensive in triage otherwise vital signs are stable.  Patient in mild distress on exam due to pain.  Differential diagnosis includes, but is not limited to, first-degree burn, second-degree burn, third-degree burn, wound infection.  Patient's presentation is most consistent with acute, uncomplicated illness.  Patient does not have any blisters on the hand but does have some areas of erythema so I believe she has first-degree burns.  Patient was instructed on wound care.  She can continue to apply ice or cool water as needed.  She voiced understanding, all questions were answered and she was stable at discharge.  FINAL CLINICAL IMPRESSION(S) / ED DIAGNOSES   Final diagnoses:  First degree burn of right hand including fingers, initial encounter     Rx / DC Orders   ED Discharge Orders     None        Note:  This document was prepared using Dragon voice recognition software and may include unintentional dictation errors.   Cameron Ali, PA-C 01/31/23 1803    Willy Eddy, MD 01/31/23 2235

## 2023-01-31 NOTE — Discharge Instructions (Addendum)
At the time of my exam it appears that you have a first-degree burn, this is similar to a sunburn and will heal in 5-10 days.  You will want to wash your hands with soap and water.  You can cover the areas with triple antibiotic ointment and a bandage.  You may develop blisters later, if you do, please do not pop them.

## 2023-02-22 ENCOUNTER — Ambulatory Visit (INDEPENDENT_AMBULATORY_CARE_PROVIDER_SITE_OTHER)
Admission: RE | Admit: 2023-02-22 | Discharge: 2023-02-22 | Disposition: A | Discharge status: Home / Self Care | Payer: Medicare HMO | Source: Ambulatory Visit | Attending: Family Medicine | Admitting: Family Medicine

## 2023-02-22 ENCOUNTER — Encounter: Payer: Self-pay | Admitting: Family Medicine

## 2023-02-22 ENCOUNTER — Ambulatory Visit: Payer: Medicare HMO | Admitting: Family Medicine

## 2023-02-22 VITALS — BP 126/70 | HR 72 | Temp 98.0°F | Ht 60.0 in | Wt 187.0 lb

## 2023-02-22 DIAGNOSIS — J4489 Other specified chronic obstructive pulmonary disease: Secondary | ICD-10-CM | POA: Diagnosis not present

## 2023-02-22 DIAGNOSIS — R058 Other specified cough: Secondary | ICD-10-CM

## 2023-02-22 DIAGNOSIS — R051 Acute cough: Secondary | ICD-10-CM | POA: Diagnosis not present

## 2023-02-22 DIAGNOSIS — R1084 Generalized abdominal pain: Secondary | ICD-10-CM | POA: Diagnosis not present

## 2023-02-22 DIAGNOSIS — R300 Dysuria: Secondary | ICD-10-CM | POA: Diagnosis not present

## 2023-02-22 DIAGNOSIS — R059 Cough, unspecified: Secondary | ICD-10-CM | POA: Diagnosis not present

## 2023-02-22 DIAGNOSIS — Z87891 Personal history of nicotine dependence: Secondary | ICD-10-CM | POA: Diagnosis not present

## 2023-02-22 DIAGNOSIS — R109 Unspecified abdominal pain: Secondary | ICD-10-CM | POA: Insufficient documentation

## 2023-02-22 DIAGNOSIS — R062 Wheezing: Secondary | ICD-10-CM | POA: Diagnosis not present

## 2023-02-22 LAB — BASIC METABOLIC PANEL
BUN: 9 mg/dL (ref 6–23)
CO2: 34 meq/L — ABNORMAL HIGH (ref 19–32)
Calcium: 9.6 mg/dL (ref 8.4–10.5)
Chloride: 99 meq/L (ref 96–112)
Creatinine, Ser: 1.1 mg/dL (ref 0.40–1.20)
GFR: 52.89 mL/min — ABNORMAL LOW (ref >60.00)
Glucose, Bld: 106 mg/dL — ABNORMAL HIGH (ref 70–99)
Potassium: 4.4 meq/L (ref 3.5–5.1)
Sodium: 139 meq/L (ref 135–145)

## 2023-02-22 LAB — CBC WITH DIFFERENTIAL/PLATELET
Basophils Absolute: 0.1 10*3/uL (ref 0.0–0.1)
Basophils Relative: 1.2 % (ref 0.0–3.0)
Eosinophils Absolute: 0.2 10*3/uL (ref 0.0–0.7)
Eosinophils Relative: 3.1 % (ref 0.0–5.0)
HCT: 43.9 % (ref 36.0–46.0)
Hemoglobin: 14.3 g/dL (ref 12.0–15.0)
Lymphocytes Relative: 32.8 % (ref 12.0–46.0)
Lymphs Abs: 2.4 10*3/uL (ref 0.7–4.0)
MCHC: 32.6 g/dL (ref 30.0–36.0)
MCV: 95.6 fL (ref 78.0–100.0)
Monocytes Absolute: 0.6 10*3/uL (ref 0.1–1.0)
Monocytes Relative: 8.7 % (ref 3.0–12.0)
Neutro Abs: 3.9 10*3/uL (ref 1.4–7.7)
Neutrophils Relative %: 54.2 % (ref 43.0–77.0)
Platelets: 241 10*3/uL (ref 150.0–400.0)
RBC: 4.59 Mil/uL (ref 3.87–5.11)
RDW: 14.2 % (ref 11.5–15.5)
WBC: 7.2 10*3/uL (ref 4.0–10.5)

## 2023-02-22 LAB — HEPATIC FUNCTION PANEL
ALT: 12 U/L (ref 0–35)
AST: 16 U/L (ref 0–37)
Albumin: 4.2 g/dL (ref 3.5–5.2)
Alkaline Phosphatase: 76 U/L (ref 39–117)
Bilirubin, Direct: 0.1 mg/dL (ref 0.0–0.3)
Total Bilirubin: 0.6 mg/dL (ref 0.2–1.2)
Total Protein: 6.7 g/dL (ref 6.0–8.3)

## 2023-02-22 LAB — POC COVID19 BINAXNOW: SARS Coronavirus 2 Ag: NEGATIVE

## 2023-02-22 LAB — POC URINALSYSI DIPSTICK (AUTOMATED)
Bilirubin, UA: 1
Blood, UA: NEGATIVE
Glucose, UA: NEGATIVE
Ketones, UA: NEGATIVE
Leukocytes, UA: NEGATIVE
Nitrite, UA: NEGATIVE
Protein, UA: NEGATIVE
Spec Grav, UA: 1.02 (ref 1.010–1.025)
Urobilinogen, UA: 0.2 U/dL
pH, UA: 6 (ref 5.0–8.0)

## 2023-02-22 LAB — LIPASE: Lipase: 40 U/L (ref 11.0–59.0)

## 2023-02-22 MED ORDER — AMOXICILLIN-POT CLAVULANATE 875-125 MG PO TABS
1.0000 | ORAL_TABLET | Freq: Two times a day (BID) | ORAL | 0 refills | Status: DC
Start: 2023-02-22 — End: 2023-03-29

## 2023-02-22 MED ORDER — PROMETHAZINE-DM 6.25-15 MG/5ML PO SYRP: 5.0000 mL | ORAL_SOLUTION | Freq: Three times a day (TID) | ORAL | 0 refills | Status: DC | PRN

## 2023-02-22 MED ORDER — PREDNISONE 10 MG PO TABS: ORAL_TABLET | ORAL | 0 refills | Status: DC

## 2023-02-22 MED ORDER — ALBUTEROL SULFATE HFA 108 (90 BASE) MCG/ACT IN AERS: INHALATION_SPRAY | RESPIRATORY_TRACT | 3 refills | Status: AC

## 2023-02-22 NOTE — Assessment & Plan Note (Signed)
Worse for past month in setting of uri /prod cough  Continues symbicord bid along with prn albuterol and duo neb   Today- prednisone and augmentin ordered for exacerbation  Cxr

## 2023-02-22 NOTE — Assessment & Plan Note (Addendum)
Somewhat vague  Denies new stool changes or vomiting or heartburn  Does get nauseated if she does not eat  On exam -most tender in bilat UQ and LLQ No signs/symptoms of acute abd  Despite appetite change weight is up 7 lb  No fever  No nsaids  Urinalysis is clear with some bili  Neg covid testing  History of ccy in past  History of diverticulosis  Reviewed meds-does take pepcid 20 mg bid (? If any component of gastritis)    Also has prod cough/uri symptoms (see a/p)  Labs today   Plan to follow  ? If may need ppi or imaging

## 2023-02-22 NOTE — Progress Notes (Signed)
Subjective:    Patient ID: Karina [name], female    DOB: [birth year], 67 y.o.   MRN: [id number]  HPI  Wt Readings from Last 3 Encounters:  02/22/23 187 lb (84.8 kg)  01/31/23 180 lb (81.6 kg)  11/15/22 186 lb 4 oz (84.5 kg)   36.52 kg/m  Vitals:   02/22/23 0757  BP: 126/70  Pulse: 72  Temp: 98 F (36.7 C)  SpO2: 93%    Pt presents for c/o uri symptoms /congestion and cough  Also for dysuria   Gas history of chronic asthmatic bronchitis / copd Symbicort  Albuterol  Duo neb   Former smoker   Going on for about a month  Coughs up yellow phlegm  Some wheezing  Shortness of breath when she cough  Some runny nose  Some sinus pressure   Throat is sore Ears are fine   Results for orders placed or performed in visit on 02/22/23  POCT Urinalysis Dipstick (Automated)   Collection Time: 02/22/23  8:17 AM  Result Value Ref Range   Color, UA Yellow    Clarity, UA Clear    Glucose, UA Negative Negative   Bilirubin, UA 1 mg/dL    Ketones, UA Negative    Spec Grav, UA 1.020 1.010 - 1.025   Blood, UA Negative    pH, UA 6.0 5.0 - 8.0   Protein, UA Negative Negative   Urobilinogen, UA 0.2 0.2 or 1.0 E.U./dL   Nitrite, UA Negative    Leukocytes, UA Negative Negative  POC COVID-19   Collection Time: 02/22/23  8:17 AM  Result Value Ref Range   SARS Coronavirus 2 Ag Negative Negative     Cxr today DG Chest 2 View Result Date: 02/22/2023 CLINICAL DATA:  Cough and wheezing for 1 month, history of tobacco abuse EXAM: CHEST - 2 VIEW COMPARISON:  01/08/2021 FINDINGS: The heart size and mediastinal contours are within normal limits. Both lungs are clear. The visualized skeletal structures are unremarkable. IMPRESSION: No active cardiopulmonary disease. Electronically Signed   By: Sharlet Salina M.D.   On: 02/22/2023 10:48      Also stomach hurts Not eating a lot  Nausea when she does not eat  Not vomiting  Some baseline diarrhea  No blood in stool  No fever    Burning to urinate  Thinks her bladder has "fallen"  Has to strain to urinate  Incontinent with cough  Over the counter [place] cough drops Nyquil    Last CT abd/pelvis was 2018 IMPRESSION: 1. Colonic diverticulosis without acute diverticulitis. No bowel obstruction. 2. Posterior calcified uterine fibroid measuring 8 x 5 mm. No adnexal mass or ascites. 3. Status post cholecystectomy. 4. Mild aortoiliac atherosclerosis. 5. Stable subpleural left lower lobe pulmonary nodule dating back to 2010.   Patient Active Problem List   Diagnosis Date Noted   Abdominal pain 02/22/2023   Depressed mood 10/04/2022   Left flank pain 07/31/2022   Petechiae 07/26/2022   Globus sensation 07/26/2022   Viral conjunctivitis 03/30/2022   Left shoulder pain 08/14/2021   Occult blood positive stool 05/25/2021   Colon cancer screening 05/07/2021   Estrogen deficiency 05/07/2021   Rheumatoid arthritis (HCC) 03/31/2021   Coronary artery calcification 03/31/2021   Hypokalemia 08/27/2020   Joint pain in both hands 10/12/2019   Productive cough 08/22/2019   Head pain 08/22/2019   Elevated serum creatinine 04/24/2019   Frequent urination 04/20/2019   Decreased appetite 04/20/2019   Encounter for screening mammogram for breast  cancer 04/20/2019   Seborrheic keratosis, inflamed 12/15/2017   B12 deficiency 08/24/2017   Left low back pain 08/24/2017   Leg lesion 04/05/2017   Erosive osteoarthritis of both hands 09/09/2016   Fatigue 09/07/2016   Bilateral hand pain 09/07/2016   Routine general medical examination at a health care facility 05/06/2015   Large breasts 05/06/2015   Lumbar disc disease 11/01/2014   Knee pain, bilateral 03/11/2014   Thoracic back pain 11/30/2013   Encounter for routine gynecological examination 01/31/2013   Encounter for Medicare annual wellness exam 01/23/2013   Chest pain 09/20/2012   Dyspnea 09/13/2012   Asthmatic bronchitis , chronic (HCC) 12/06/2011    Chronic cough 11/22/2011   Anxiety disorder 07/17/2009   HEMORRHOIDS-EXTERNAL 11/07/2008   IRRITABLE BOWEL SYNDROME 11/07/2008   Lung nodule 10/14/2008   Prediabetes 10/14/2008   History of colonic polyps 10/08/2008   Vitamin D deficiency 07/23/2008   SYNCOPE, HX OF 06/01/2008   FOOT SURGERY, HX OF 06/01/2008   DILATION AND CURETTAGE, HX OF 06/01/2008   OSTEOARTHRITIS, SHOULDER, RIGHT 03/22/2008   ROTATOR CUFF SYNDROME, RIGHT 03/22/2008   ARTHRALGIA 12/12/2007   Allergic rhinitis 08/01/2007   Former smoker 10/17/2006   Peripheral neuropathy 10/17/2006   FOOT PAIN, BILATERAL 10/17/2006   HYPOGLYCEMIA 09/30/2006   HYPERCHOLESTEROLEMIA 09/30/2006   MULTIPLE SCLEROSIS 09/30/2006   GERD 09/30/2006   FIBROCYSTIC BREAST DISEASE 09/30/2006   Dizziness 09/30/2006   Past Medical History:  Diagnosis Date   Allergic rhinitis    Allergy    Anxiety    [facility]- Evalina Field, no per pt   Cataract    Colon polyps 08/2008   Adenomatous   COPD (chronic obstructive pulmonary disease) (HCC)    Depression    no per pt   Diabetes mellitus    Gastritis 08/2008   H. pylori   GERD (gastroesophageal reflux disease)    Hypercholesteremia    Lung nodule    Menopausal disorder    Multiple sclerosis (HCC)    Neuromuscular disorder (HCC)    Plantar fasciitis    Seizures (HCC)    1 seizure in 2001 due to blood sugar dropping to 38, none since   Shingles    Left V1   Urinary incontinence    Vertigo    Past Surgical History:  Procedure Laterality Date   CATARACT EXTRACTION Bilateral 2023   CERVICAL DISCECTOMY  03/08/2002   x 2, Anterior; Fusion C4-5, C5-6, C6-7, Dr. Donalee Citrin   CHEST CT  11/06/2008   With small 4 mm nodule L lung base (rec re check in 1 year)   CHEST CT  07/06/2009   Re check chest CT - lung nodule stable   CHOLECYSTECTOMY     COLONOSCOPY  08/06/2008   Polyps/ re check 5 yrs   CT SINUS LTD W/O CM  02/05/2009   negative   DILATION AND CURETTAGE OF UTERUS      ESOPHAGOGASTRODUODENOSCOPY  08/06/2008   Erosive gastritis, h pylori (treated)   FOOT SURGERY     left plantar fascial problem   ROTATOR CUFF REPAIR     right   TONSILLECTOMY     TUBAL LIGATION     UPPER GASTROINTESTINAL ENDOSCOPY     Social History   Tobacco Use   Smoking status: Former    Current packs/day: 0.00    Average packs/day: 2.0 packs/day for 30.0 years (60.0 ttl pk-yrs)    Types: Cigarettes    Start date: 05/07/1991    Quit date: 05/06/2021  Years since quitting: 1.8   Smokeless tobacco: Never  Vaping Use   Vaping status: Never Used  Substance Use Topics   Alcohol use: No    Alcohol/week: 0.0 standard drinks of alcohol   Drug use: No   Family History  Problem Relation Age of Onset   Migraines Mother    Heart attack Mother    Coronary artery disease Mother    Coronary artery disease Father        6 bypasses    Stroke Father    Diabetes Father    Renal Disease Father    Colon cancer Maternal Grandfather 80   Coronary artery disease Cousin    Coronary artery disease Paternal Grandmother    Lung cancer Paternal Aunt    Multiple sclerosis Neg Hx    Esophageal cancer Neg Hx    Rectal cancer Neg Hx    Stomach cancer Neg Hx    Breast cancer Neg Hx    Allergies  Allergen Reactions   Atorvastatin Other (See Comments)    elevated LFT's   Percocet [Oxycodone-Acetaminophen] Shortness Of Breath    Felt like going to pass out and sweating   Tecfidera [Dimethyl Fumarate] Shortness Of Breath, Palpitations, Rash and Cough   Amitriptyline Hcl Itching and Swelling   Betaseron [Interferon Beta-1b] Other (See Comments)    Headache   Duloxetine Other (See Comments)    pain   Pregabalin Other (See Comments)    LE swelling   Zoloft [Sertraline Hcl] Other (See Comments)    Headaches    Crestor [Rosuvastatin] Other (See Comments)    Muscle aches and cramps to generic crestor and other statins   Clarithromycin Other (See Comments)    reaction not known    Levofloxacin Rash   Pseudoephedrine Other (See Comments)    legs hurt   Current Outpatient Medications on File Prior to Visit  Medication Sig Dispense Refill   ALPRAZolam (XANAX) 0.5 MG tablet Take 1 tablet (0.5 mg total) by mouth 2 (two) times daily as needed. For anxiety. 30 tablet 0   aspirin EC 81 MG tablet Take 1 tablet (81 mg total) by mouth daily. Swallow whole. 90 tablet 3   budesonide-formoterol (SYMBICORT) 160-4.5 MCG/ACT inhaler Inhale 2 puffs into the lungs 2 (two) times daily. 3 each 1   cholecalciferol (VITAMIN D3) 25 MCG (1000 UNIT) tablet Take 1,000 Units by mouth daily.     famotidine (PEPCID) 20 MG tablet TAKE 1 TABLET BY MOUTH TWICE A DAY 180 tablet 0   fluticasone (FLONASE) 50 MCG/ACT nasal spray Place 2 sprays into both nostrils daily. 16 g 11   gabapentin (NEURONTIN) 300 MG capsule Take 600 mg by mouth at bedtime.     ipratropium-albuterol (DUONEB) 0.5-2.5 (3) MG/3ML SOLN TAKE 3 MLS BY NEBULIZATION EVERY 6 (SIX) HOURS AS NEEDED. 360 mL 0   Multiple Vitamin (MULTIVITAMIN) tablet Take 1 tablet by mouth daily.     nicotine (NICODERM CQ - DOSED IN MG/24 HOURS) 21 mg/24hr patch Place 21 mg onto the skin daily.     Omega-3 Fatty Acids (FISH OIL PO) Take 1 capsule by mouth daily.     Spacer/Aero-Holding Chambers (AEROCHAMBER MV) inhaler Use as instructed 1 each 0   No current facility-administered medications on file prior to visit.    Review of Systems  Constitutional:  Positive for appetite change and fatigue. Negative for activity change, fever and unexpected weight change.  HENT:  Positive for congestion, postnasal drip, rhinorrhea, sinus pressure and sore  throat. Negative for ear pain and sinus pain.   Eyes:  Negative for pain, redness and visual disturbance.  Respiratory:  Positive for cough, shortness of breath and wheezing. Negative for stridor.   Cardiovascular:  Negative for chest pain and palpitations.  Gastrointestinal:  Positive for abdominal pain and nausea.  Negative for abdominal distention, anal bleeding, blood in stool, constipation, rectal pain and vomiting.       Stool is no more loose than baseline  Endocrine: Negative for polydipsia and polyuria.  Genitourinary:  Positive for dysuria. Negative for frequency and urgency.       Urinary incontinence   Musculoskeletal:  Negative for arthralgias, back pain and myalgias.  Skin:  Negative for pallor and rash.  Allergic/Immunologic: Negative for environmental allergies.  Neurological:  Negative for dizziness, syncope and headaches.  Hematological:  Negative for adenopathy. Does not bruise/bleed easily.  Psychiatric/Behavioral:  Negative for decreased concentration and dysphoric mood. The patient is not nervous/anxious.        Objective:   Physical Exam Constitutional:      General: She is not in acute distress.    Appearance: Normal appearance. She is well-developed. She is obese. She is not ill-appearing or diaphoretic.  HENT:     Head: Normocephalic and atraumatic.     Right Ear: Tympanic membrane and ear canal normal.     Left Ear: Tympanic membrane and ear canal normal.     Nose: Congestion and rhinorrhea present.     Mouth/Throat:     Mouth: Mucous membranes are moist.     Pharynx: No oropharyngeal exudate or posterior oropharyngeal erythema.  Eyes:     General:        Right eye: No discharge.        Left eye: No discharge.     Conjunctiva/sclera: Conjunctivae normal.     Pupils: Pupils are equal, round, and reactive to light.  Neck:     Thyroid: No thyromegaly.     Vascular: No carotid bruit or JVD.  Cardiovascular:     Rate and Rhythm: Normal rate and regular rhythm.     Heart sounds: Normal heart sounds.     No gallop.  Pulmonary:     Effort: Pulmonary effort is normal. No respiratory distress.     Breath sounds: No stridor. Wheezing and rhonchi present. No rales.     Comments: Scattered rhonchi  End exp wheeze Exp phase is not prolonged  Abdominal:     General:  Abdomen is protuberant. Bowel sounds are normal. There is no distension or abdominal bruit.     Palpations: Abdomen is soft. There is no hepatomegaly, splenomegaly, mass or pulsatile mass.     Tenderness: There is abdominal tenderness in the right upper quadrant, epigastric area, left upper quadrant and left lower quadrant. There is no right CVA tenderness, left CVA tenderness, guarding or rebound. Negative signs include Murphy's sign.  Musculoskeletal:     Cervical back: Normal range of motion and neck supple.     Right lower leg: No edema.     Left lower leg: No edema.  Lymphadenopathy:     Cervical: No cervical adenopathy.  Skin:    General: Skin is warm and dry.     Coloration: Skin is not jaundiced or pale.     Findings: No rash.  Neurological:     Mental Status: She is alert.     Coordination: Coordination normal.     Deep Tendon Reflexes: Reflexes are normal and symmetric. Reflexes  normal.  Psychiatric:        Mood and Affect: Mood is depressed.           Assessment & Plan:   Problem List Items Addressed This Visit       Respiratory   Asthmatic bronchitis , chronic (HCC)   Worse for past month in setting of uri /prod cough  Continues symbicord bid along with prn albuterol and duo neb   Today- prednisone and augmentin ordered for exacerbation  Cxr       Relevant Medications   albuterol (VENTOLIN HFA) 108 (90 Base) MCG/ACT inhaler   predniSONE (DELTASONE) 10 MG tablet     Other   Productive cough - Primary   For approx a month  In setting of past smoking/asthmatic bronchitis  Also rhinorrhea Neg covid test   Wheeze and rhonchi on exam Cxr : no active cardiopulm dz Suspect this is a flare of copd   Prednisone 40 mg taper (discussed side effects) Augmentin as directed  Prometh dm for cough       Relevant Orders   DG Chest 2 View (Completed)   Abdominal pain   Somewhat vague  Denies new stool changes or vomiting or heartburn  Does get nauseated if  she does not eat  On exam -most tender in bilat UQ and LLQ No signs/symptoms of acute abd  Despite appetite change weight is up 7 lb  No fever  No nsaids  Urinalysis is clear with some bili  Neg covid testing  History of ccy in past  History of diverticulosis  Reviewed meds-does take pepcid 20 mg bid (? If any component of gastritis)    Also has prod cough/uri symptoms (see a/p)  Labs today   Plan to follow  ? If may need ppi or imaging       Relevant Orders   CBC with Differential/Platelet   Basic metabolic panel   Hepatic function panel   Lipase   Other Visit Diagnoses       Acute cough       Relevant Orders   POC COVID-19 (Completed)     Dysuria       Relevant Orders   POCT Urinalysis Dipstick (Automated) (Completed)

## 2023-02-22 NOTE — Patient Instructions (Signed)
Drink fluids   Take the prednisone and augmentin as directed  Try the prometh dm for cough  Chest xray today  Labs today    We will call you with the results

## 2023-02-22 NOTE — Assessment & Plan Note (Addendum)
For approx a month  In setting of past smoking/asthmatic bronchitis  Also rhinorrhea Neg covid test   Wheeze and rhonchi on exam Cxr : no active cardiopulm dz Suspect this is a flare of copd   Prednisone 40 mg taper (discussed side effects) Augmentin as directed  Prometh dm for cough

## 2023-02-25 ENCOUNTER — Telehealth: Payer: Self-pay | Admitting: Family Medicine

## 2023-02-25 DIAGNOSIS — K219 Gastro-esophageal reflux disease without esophagitis: Secondary | ICD-10-CM

## 2023-02-25 DIAGNOSIS — R1084 Generalized abdominal pain: Secondary | ICD-10-CM

## 2023-02-25 NOTE — Telephone Encounter (Signed)
Pt is needing [name] new Gi doctor  Copied from CRM [id number]. Topic: Referral - Request for Referral >> Feb 24, 2023  4:05 PM Karina [name] wrote: Did the patient discuss referral with their provider in the last year? Yes  Type of order/referral and detailed reason for visit: Gastroenterologist. Patient states her current GI is retiring.   Preference of office, provider, location: [facility]  If referral order, have you been seen by this specialty before? No (If Yes, this issue or another issue? When? Where?  Can we respond through MyChart? Yes

## 2023-02-25 NOTE — Telephone Encounter (Signed)
Left voicemail advising patient that referral has been placed per dpr. Advised to call back with any further questions.

## 2023-02-25 NOTE — Telephone Encounter (Signed)
Referral done  Please give her # if needed to call [facility] in [place] and schedule Thanks   Dr Russella Dar is retiring

## 2023-03-03 ENCOUNTER — Encounter: Payer: Self-pay | Admitting: Family Medicine

## 2023-03-03 NOTE — Telephone Encounter (Signed)
Please give her the # to call [facility] - I cannot move up appointment unfortunately but they may be able to get her on a cancellation list If abdominal pain becomes severe I recommend ER in the meantime   She should call neuro regarding the tremor /nerve pain  If she is still having productive cough (mucous from lungs) or wheezing-please follow up for a re check

## 2023-03-13 NOTE — Progress Notes (Deleted)
 Cardiology Office Note  Date:  03/13/2023   ID:  Karina [name], Karina [name] 1955/07/30, MRN [id number]  PCP:  Randeen Laine LABOR, MD   Cc: Chest pain  HPI:  Karina [name] is a 68 year old woman with a pmh of  long smoking history from age 65 COPD history of asthma,  multiple sclerosis,  previous history of chest pain with negative stress test in 02-13-08 (nuclear treadmill study), previously seen by myself in 02/12/2013 for chest pain, swelling, at that time had a stress test showing no ischemia  who presents for f/u of her  chest pain, shortness of breath  Last seen in clinic 2/23  At that time she reported chest pain, cardiac CTA was ordered We received a phone call, she declined the study and did not want to take the metoprolol  to slow her heart rate down  On further discussion today, reports chest pain has improved She does not want Myoview as the medication hurt her stomach.   It continues to hurt  Feels her chest pain has improved since she stopped smoking Using nicotine patches  Reports she is currently not taking Crestor , needs a new prescription, only wants brand-name  CT scan chest  mild coronary calcification in LAD, RCA  Reviewed recent events Covid: sept/oct 2022 Sedentary, no regular exercise program Chronic shortness of breath  Other past medical history reviewed Prior history chest pain when last seen in clinic Feb 13, 2016 Severe episode of chest pain 05/10/2015, went away without intervention  Has had cholesterol up to 260 off statins   August 2013 she was started on a new medication for her MS. After being on this for 7 days, she felt poorly. She had fluid retention, abdominal swelling. Medication was held. she continued to have swelling in her upper tremors, legs, feet, stomach, all over.   Father died in February 13, 2012.    PMH:   has a past medical history of Allergic rhinitis, Allergy, Anxiety, Cataract, Colon polyps (08/2008), COPD (chronic obstructive pulmonary disease) (HCC),  Depression, Diabetes mellitus, Gastritis (08/2008), GERD (gastroesophageal reflux disease), Hypercholesteremia, Lung nodule, Menopausal disorder, Multiple sclerosis (HCC), Neuromuscular disorder (HCC), Plantar fasciitis, Seizures (HCC), Shingles, Urinary incontinence, and Vertigo.  PSH:    Past Surgical History:  Procedure Laterality Date   CATARACT EXTRACTION Bilateral 2022/02/12   CERVICAL DISCECTOMY  03/08/2002   x 2, Anterior; Fusion C4-5, C5-6, C6-7, Dr. Arley Helling   CHEST CT  11/06/2008   With small 4 mm nodule L lung base (rec re check in 1 year)   CHEST CT  07/06/2009   Re check chest CT - lung nodule stable   CHOLECYSTECTOMY     COLONOSCOPY  08/06/2008   Polyps/ re check 5 yrs   CT SINUS LTD W/O CM  02/05/2009   negative   DILATION AND CURETTAGE OF UTERUS     ESOPHAGOGASTRODUODENOSCOPY  08/06/2008   Erosive gastritis, h pylori (treated)   FOOT SURGERY     left plantar fascial problem   ROTATOR CUFF REPAIR     right   TONSILLECTOMY     TUBAL LIGATION     UPPER GASTROINTESTINAL ENDOSCOPY      Current Outpatient Medications  Medication Sig Dispense Refill   albuterol  (VENTOLIN  HFA) 108 (90 Base) MCG/ACT inhaler INHALE 2 PUFFS UP TO EVERY 4 HOURS AS NEEDED FOR WHEEZING 3 each 3   ALPRAZolam  (XANAX ) 0.5 MG tablet Take 1 tablet (0.5 mg total) by mouth 2 (two) times daily as needed. For anxiety. 30 tablet 0  amoxicillin -clavulanate (AUGMENTIN ) 875-125 MG tablet Take 1 tablet by mouth 2 (two) times daily. 14 tablet 0   aspirin  EC 81 MG tablet Take 1 tablet (81 mg total) by mouth daily. Swallow whole. 90 tablet 3   budesonide -formoterol  (SYMBICORT ) 160-4.5 MCG/ACT inhaler Inhale 2 puffs into the lungs 2 (two) times daily. 3 each 1   cholecalciferol (VITAMIN D3) 25 MCG (1000 UNIT) tablet Take 1,000 Units by mouth daily.     famotidine  (PEPCID ) 20 MG tablet TAKE 1 TABLET BY MOUTH TWICE A DAY 180 tablet 0   fluticasone  (FLONASE ) 50 MCG/ACT nasal spray Place 2 sprays into both nostrils  daily. 16 g 11   gabapentin  (NEURONTIN ) 300 MG capsule Take 600 mg by mouth at bedtime.     ipratropium-albuterol  (DUONEB) 0.5-2.5 (3) MG/3ML SOLN TAKE 3 MLS BY NEBULIZATION EVERY 6 (SIX) HOURS AS NEEDED. 360 mL 0   Multiple Vitamin (MULTIVITAMIN) tablet Take 1 tablet by mouth daily.     nicotine (NICODERM CQ - DOSED IN MG/24 HOURS) 21 mg/24hr patch Place 21 mg onto the skin daily.     Omega-3 Fatty Acids (FISH OIL PO) Take 1 capsule by mouth daily.     predniSONE  (DELTASONE ) 10 MG tablet Take 4 pills once daily by mouth for 3 days, then 3 pills daily for 3 days, then 2 pills daily for 3 days then 1 pill daily for 3 days then stop 30 tablet 0   promethazine -dextromethorphan (PROMETHAZINE -DM) 6.25-15 MG/5ML syrup Take 5 mLs by mouth 3 (three) times daily as needed for cough. Caution of sedation 118 mL 0   Spacer/Aero-Holding Chambers (AEROCHAMBER MV) inhaler Use as instructed 1 each 0   No current facility-administered medications for this visit.     Allergies:   Atorvastatin, Percocet [oxycodone-acetaminophen ], Tecfidera [dimethyl fumarate], Amitriptyline hcl, Betaseron [interferon beta-1b], Duloxetine, Pregabalin, Zoloft  [sertraline  hcl], Crestor  [rosuvastatin ], Clarithromycin, Levofloxacin, and Pseudoephedrine   Social History:  The patient  reports that she quit smoking about 22 months ago. Her smoking use included cigarettes. She started smoking about 31 years ago. She has a 60 pack-year smoking history. She has never used smokeless tobacco. She reports that she does not drink alcohol and does not use drugs.   Family History:   family history includes Colon cancer (age of onset: 54) in her maternal grandfather; Coronary artery disease in her cousin, father, mother, and paternal grandmother; Diabetes in her father; Heart attack in her mother; Lung cancer in her paternal aunt; Migraines in her mother; Renal Disease in her father; Stroke in her father.    Review of Systems: Review of Systems   Constitutional: Negative.   HENT: Negative.    Respiratory: Negative.    Cardiovascular:  Positive for chest pain.  Gastrointestinal: Negative.   Musculoskeletal: Negative.   Neurological: Negative.   Psychiatric/Behavioral: Negative.    All other systems reviewed and are negative.    PHYSICAL EXAM: VS:  There were no vitals taken for this visit. , BMI There is no height or weight on file to calculate BMI. GEN: Well nourished, well developed, in no acute distress HEENT: normal Neck: no JVD, carotid bruits, or masses Cardiac: RRR; no murmurs, rubs, or gallops,no edema  Respiratory:  clear to auscultation bilaterally, normal work of breathing GI: soft, nontender, nondistended, + BS MS: no deformity or atrophy Skin: warm and dry, no rash Neuro:  Strength and sensation are intact Psych: euthymic mood, full affect  Recent Labs: 07/26/2022: TSH 2.84 02/22/2023: ALT 12; BUN 9; Creatinine, Ser 1.10;  Hemoglobin 14.3; Platelets 241.0; Potassium 4.4; Sodium 139    Lipid Panel Lab Results  Component Value Date   CHOL 267 (H) 07/26/2022   HDL 77.10 07/26/2022   LDLCALC 170 (H) 07/26/2022   TRIG 101.0 07/26/2022      Wt Readings from Last 3 Encounters:  02/22/23 187 lb (84.8 kg)  01/31/23 180 lb (81.6 kg)  11/15/22 186 lb 4 oz (84.5 kg)      ASSESSMENT AND PLAN:  Problem List Items Addressed This Visit   None    Chest pain/angina She did not want cardiac CTA, did not want to take metoprolol  Does not want stress test, medication hurt her stomach (continues to hurt) Given chest pain symptoms essentially resolved, no further ischemic work-up at this time  Hyperlipidemia Crestor  prescription sent in, willing to retry 10 mg daily, would likely need to uptitrate to 20  Smoker We have encouraged her to continue to work on weaning her cigarettes and smoking cessation. She will continue to work on this and does not want any assistance with chantix .  .    COPD Smoking  since age 52 Continued use of smoking patches   Total encounter time more than 40 minutes  Greater than 50% was spent in counseling and coordination of care with the patient    Signed, Velinda Lunger, M.D., Ph.D. [facility] [place], [place] [phone]

## 2023-03-15 ENCOUNTER — Ambulatory Visit: Payer: Medicare HMO | Attending: Cardiovascular Disease | Admitting: Cardiovascular Disease

## 2023-03-15 DIAGNOSIS — F17200 Nicotine dependence, unspecified, uncomplicated: Secondary | ICD-10-CM

## 2023-03-15 DIAGNOSIS — R079 Chest pain, unspecified: Secondary | ICD-10-CM

## 2023-03-15 DIAGNOSIS — E7800 Pure hypercholesterolemia, unspecified: Secondary | ICD-10-CM

## 2023-03-15 DIAGNOSIS — I2510 Atherosclerotic heart disease of native coronary artery without angina pectoris: Secondary | ICD-10-CM

## 2023-03-15 DIAGNOSIS — R7303 Prediabetes: Secondary | ICD-10-CM

## 2023-03-16 NOTE — Telephone Encounter (Signed)
 Please schedule appointment for fatigue - we will discuss this and talk about kidney function and check urine

## 2023-03-17 NOTE — Telephone Encounter (Signed)
 [email or web address]

## 2023-03-21 ENCOUNTER — Ambulatory Visit: Payer: Medicare HMO | Admitting: Family Medicine

## 2023-03-24 ENCOUNTER — Other Ambulatory Visit: Payer: Self-pay | Admitting: Neurology

## 2023-03-28 NOTE — Progress Notes (Unsigned)
Chief Complaint: GERD, generalized abdominal pain Primary GI Doctor: (Previously Dr. Russella Dar)  HPI: Patient is a 68 year old female/female patient with past medical history of MS, COPD, depression, seizures, GERD, diabetes, anxiety and neuromuscular disorder who was referred to me by Judy Pimple, MD on 02/25/2023 for a complaint of GERD and generalized abdominal pain.    07/29/2021 patient last seen by Dr. Russella Dar in [facility] for colonoscopy which revealed one 9 mm polyp in the ascending colon, mild diverticulosis, and external hemorrhoids.  Interval History  Patient admits/denies GERD Pepcid 20 mg po BID  Patient admits/denies dysphagia Patient admits/denies nausea, vomiting, or weight loss  Patient admits/denies altered bowel habits Patient admits/denies abdominal pain Patient admits/denies rectal bleeding   Denies/Admits alcohol Denies/Admits smoking Denies/Admits NSAID use. Denies/Admits they are on blood thinners.  Patients last colonoscopy Patients last EGD  Patient's family history includes  Wt Readings from Last 3 Encounters:  02/22/23 187 lb (84.8 kg)  01/31/23 180 lb (81.6 kg)  11/15/22 186 lb 4 oz (84.5 kg)      Past Medical History:  Diagnosis Date   Allergic rhinitis    Allergy    Anxiety    [facility]- Evalina Field, no per pt   Cataract    Colon polyps 08/2008   Adenomatous   COPD (chronic obstructive pulmonary disease) (HCC)    Depression    no per pt   Diabetes mellitus    Gastritis 08/2008   H. pylori   GERD (gastroesophageal reflux disease)    Hypercholesteremia    Lung nodule    Menopausal disorder    Multiple sclerosis (HCC)    Neuromuscular disorder (HCC)    Plantar fasciitis    Seizures (HCC)    1 seizure in 2001 due to blood sugar dropping to 38, none since   Shingles    Left V1   Urinary incontinence    Vertigo     Past Surgical History:  Procedure Laterality Date   CATARACT EXTRACTION Bilateral 2023   CERVICAL DISCECTOMY   03/08/2002   x 2, Anterior; Fusion C4-5, C5-6, C6-7, Dr. Donalee Citrin   CHEST CT  11/06/2008   With small 4 mm nodule L lung base (rec re check in 1 year)   CHEST CT  07/06/2009   Re check chest CT - lung nodule stable   CHOLECYSTECTOMY     COLONOSCOPY  08/06/2008   Polyps/ re check 5 yrs   CT SINUS LTD W/O CM  02/05/2009   negative   DILATION AND CURETTAGE OF UTERUS     ESOPHAGOGASTRODUODENOSCOPY  08/06/2008   Erosive gastritis, h pylori (treated)   FOOT SURGERY     left plantar fascial problem   ROTATOR CUFF REPAIR     right   TONSILLECTOMY     TUBAL LIGATION     UPPER GASTROINTESTINAL ENDOSCOPY      Current Outpatient Medications  Medication Sig Dispense Refill   albuterol (VENTOLIN HFA) 108 (90 Base) MCG/ACT inhaler INHALE 2 PUFFS UP TO EVERY 4 HOURS AS NEEDED FOR WHEEZING 3 each 3   ALPRAZolam (XANAX) 0.5 MG tablet Take 1 tablet (0.5 mg total) by mouth 2 (two) times daily as needed. For anxiety. 30 tablet 0   amoxicillin-clavulanate (AUGMENTIN) 875-125 MG tablet Take 1 tablet by mouth 2 (two) times daily. 14 tablet 0   aspirin EC 81 MG tablet Take 1 tablet (81 mg total) by mouth daily. Swallow whole. 90 tablet 3   budesonide-formoterol (SYMBICORT) 160-4.5 MCG/ACT  inhaler Inhale 2 puffs into the lungs 2 (two) times daily. 3 each 1   cholecalciferol (VITAMIN D3) 25 MCG (1000 UNIT) tablet Take 1,000 Units by mouth daily.     famotidine (PEPCID) 20 MG tablet TAKE 1 TABLET BY MOUTH TWICE A DAY 180 tablet 0   fluticasone (FLONASE) 50 MCG/ACT nasal spray Place 2 sprays into both nostrils daily. 16 g 11   gabapentin (NEURONTIN) 300 MG capsule Take 600 mg by mouth at bedtime.     ipratropium-albuterol (DUONEB) 0.5-2.5 (3) MG/3ML SOLN TAKE 3 MLS BY NEBULIZATION EVERY 6 (SIX) HOURS AS NEEDED. 360 mL 0   Multiple Vitamin (MULTIVITAMIN) tablet Take 1 tablet by mouth daily.     nicotine (NICODERM CQ - DOSED IN MG/24 HOURS) 21 mg/24hr patch Place 21 mg onto the skin daily.     Omega-3 Fatty  Acids (FISH OIL PO) Take 1 capsule by mouth daily.     predniSONE (DELTASONE) 10 MG tablet Take 4 pills once daily by mouth for 3 days, then 3 pills daily for 3 days, then 2 pills daily for 3 days then 1 pill daily for 3 days then stop 30 tablet 0   promethazine-dextromethorphan (PROMETHAZINE-DM) 6.25-15 MG/5ML syrup Take 5 mLs by mouth 3 (three) times daily as needed for cough. Caution of sedation 118 mL 0   Spacer/Aero-Holding Chambers (AEROCHAMBER MV) inhaler Use as instructed 1 each 0   No current facility-administered medications for this visit.    Allergies as of 03/29/2023 - Review Complete 02/22/2023  Allergen Reaction Noted   Atorvastatin Other (See Comments) 09/30/2006   Percocet [oxycodone-acetaminophen] Shortness Of Breath 10/12/2010   Tecfidera [dimethyl fumarate] Shortness Of Breath, Palpitations, Rash, and Cough 10/22/2011   Amitriptyline hcl Itching and Swelling 06/30/2007   Betaseron [interferon beta-1b] Other (See Comments) 02/18/2015   Duloxetine Other (See Comments) 09/30/2006   Pregabalin Other (See Comments) 07/23/2008   Zoloft [sertraline hcl] Other (See Comments) 09/20/2011   Crestor [rosuvastatin] Other (See Comments) 12/07/2021   Clarithromycin Other (See Comments) 09/30/2006   Levofloxacin Rash 02/13/2007   Pseudoephedrine Other (See Comments) 09/30/2006    Family History  Problem Relation Age of Onset   Migraines Mother    Heart attack Mother    Coronary artery disease Mother    Coronary artery disease Father        6 bypasses    Stroke Father    Diabetes Father    Renal Disease Father    Colon cancer Maternal Grandfather 61   Coronary artery disease Cousin    Coronary artery disease Paternal Grandmother    Lung cancer Paternal Aunt    Multiple sclerosis Neg Hx    Esophageal cancer Neg Hx    Rectal cancer Neg Hx    Stomach cancer Neg Hx    Breast cancer Neg Hx     Review of Systems:    Constitutional: No weight loss, fever, chills, weakness or  fatigue HEENT: Eyes: No change in vision               Ears, Nose, Throat:  No change in hearing or congestion Skin: No rash or itching Cardiovascular: No chest pain, chest pressure or palpitations   Respiratory: No SOB or cough Gastrointestinal: See HPI and otherwise negative Genitourinary: No dysuria or change in urinary frequency Neurological: No headache, dizziness or syncope Musculoskeletal: No new muscle or joint pain Hematologic: No bleeding or bruising Psychiatric: No history of depression or anxiety    Physical Exam:  Vital  signs: There were no vitals taken for this visit.  Constitutional:   Pleasant Caucasian female*** appears to be in NAD, Well developed, Well nourished, alert and cooperative Head:  Normocephalic and atraumatic. Eyes:   PEERL, EOMI. No icterus. Conjunctiva pink. Ears:  Normal auditory acuity. Neck:  Supple Throat: Oral cavity and pharynx without inflammation, swelling or lesion.  Respiratory: Respirations even and unlabored. Lungs clear to auscultation bilaterally.   No wheezes, crackles, or rhonchi.  Cardiovascular: Normal S1, S2. Regular rate and rhythm. No peripheral edema, cyanosis or pallor.  Gastrointestinal:  Soft, nondistended, nontender. No rebound or guarding. Normal bowel sounds. No appreciable masses or hepatomegaly. Rectal:  Not performed.  Anoscopy: Msk:  Symmetrical without gross deformities. Without edema, no deformity or joint abnormality.  Neurologic:  Alert and  oriented x4;  grossly normal neurologically.  Skin:   Dry and intact without significant lesions or rashes. Psychiatric: Oriented to person, place and time. Demonstrates good judgement and reason without abnormal affect or behaviors.  RELEVANT LABS AND IMAGING: CBC    Latest Ref Rng & Units 02/22/2023    8:29 AM 07/26/2022   12:03 PM 05/07/2021    8:47 AM  CBC  WBC 4.0 - 10.5 K/uL 7.2  5.8  6.2   Hemoglobin 12.0 - 15.0 g/dL 82.9  56.2  13.0   Hematocrit 36.0 - 46.0 % 43.9   41.6  46.1   Platelets 150.0 - 400.0 K/uL 241.0  237.0  193.0      CMP     Latest Ref Rng & Units 02/22/2023    8:29 AM 07/26/2022   12:03 PM 09/28/2021   12:29 PM  CMP  Glucose 70 - 99 mg/dL 865  784  80   BUN 6 - 23 mg/dL 9  10  15    Creatinine 0.40 - 1.20 mg/dL 6.96  2.95  2.84   Sodium 135 - 145 mEq/L 139  139  139   Potassium 3.5 - 5.1 mEq/L 4.4  4.1  4.4   Chloride 96 - 112 mEq/L 99  102  99   CO2 19 - 32 mEq/L 34  28  31   Calcium 8.4 - 10.5 mg/dL 9.6  9.8  9.9   Total Protein 6.0 - 8.3 g/dL 6.7  7.4    Total Bilirubin 0.2 - 1.2 mg/dL 0.6  0.4    Alkaline Phos 39 - 117 U/L 76  67    AST 0 - 37 U/L 16  19    ALT 0 - 35 U/L 12  15      Lab Results  Component Value Date   TSH 2.84 07/26/2022    07/29/21 colonoscopy Impression:  - One 9 mm polyp in the ascending colon, removed with a cold snare. Resected and retrieved.  - Mild diverticulosis in the left colon. - External hemorrhoids.  - The examination was otherwise normal on direct and retroflexion views. Path:  Diagnosis Surgical [P], colon, ascending, polyp (1) SESSILE SERRATED POLYP WITHOUT CYTOLOGIC DYSPLASIA.  10/11/2013 colonoscopy ENDOSCOPIC IMPRESSION: 1. Sessile polyp measuring 5 mm in the ascending colon; polypectomy performed with a cold snare 2. Moderate internal hemorrhoids Path:Diagnosis Surgical [P], ascending, polyp - TUBULAR ADENOMA. - NO HIGH GRADE DYSPLASIA OR MALIGNANCY.  08/15/2008 EGD Moderate erosive gastritis   11/05/22 ESOPHOGRAM / BARIUM SWALLOW / BARIUM TABLET STUDY  IMPRESSION: 1. Laryngeal penetration without overt tracheal aspiration. No cough or throat clearing. 2. Prominent cricopharyngeus. 3. No significant esophageal dysmotility observed. A 13 mm barium tablet  passed briskly into the stomach.  Assessment: 1. ***  Plan: 1. ***   Thank you for the courtesy of this consult. Please call me with any questions or concerns.   Larrie Lucia, FNP-C [facility] 03/28/2023, 4:25 PM  Cc: [name], Audrie Gallus, MD

## 2023-03-29 ENCOUNTER — Encounter: Payer: Self-pay | Admitting: Gastroenterology

## 2023-03-29 ENCOUNTER — Ambulatory Visit
Admission: RE | Admit: 2023-03-29 | Discharge: 2023-03-29 | Disposition: A | Discharge status: Home / Self Care | Payer: Medicare HMO | Source: Ambulatory Visit | Attending: Gastroenterology

## 2023-03-29 ENCOUNTER — Ambulatory Visit: Payer: Medicare HMO | Admitting: Gastroenterology

## 2023-03-29 VITALS — BP 138/82 | HR 67 | Ht 60.0 in | Wt 188.4 lb

## 2023-03-29 DIAGNOSIS — K219 Gastro-esophageal reflux disease without esophagitis: Secondary | ICD-10-CM | POA: Diagnosis not present

## 2023-03-29 DIAGNOSIS — R1084 Generalized abdominal pain: Secondary | ICD-10-CM | POA: Diagnosis not present

## 2023-03-29 DIAGNOSIS — R0789 Other chest pain: Secondary | ICD-10-CM

## 2023-03-29 DIAGNOSIS — R1319 Other dysphagia: Secondary | ICD-10-CM | POA: Diagnosis not present

## 2023-03-29 DIAGNOSIS — R194 Change in bowel habit: Secondary | ICD-10-CM | POA: Diagnosis not present

## 2023-03-29 DIAGNOSIS — R197 Diarrhea, unspecified: Secondary | ICD-10-CM | POA: Diagnosis not present

## 2023-03-29 DIAGNOSIS — K5900 Constipation, unspecified: Secondary | ICD-10-CM | POA: Diagnosis not present

## 2023-03-29 DIAGNOSIS — R140 Abdominal distension (gaseous): Secondary | ICD-10-CM | POA: Diagnosis not present

## 2023-03-29 MED ORDER — FAMOTIDINE 20 MG PO TABS: 20.0000 mg | ORAL_TABLET | Freq: Every day | ORAL | 0 refills | Status: DC

## 2023-03-29 MED ORDER — CITRUCEL PO POWD: ORAL | Status: AC

## 2023-03-29 NOTE — Patient Instructions (Addendum)
Your provider has requested that you have an abdominal x ray before leaving today. Please go to the basement floor to our Radiology department for the test.   Take pantoprazole (Protonix) 30 minutes before breakfast.  Take (famotidine) Pepcid before dinner.  Please purchase the following medications over the counter and take as directed: Citrucel: Take 1 tsp as directed daily.  We are giving you a High Fiber diet handout and GERD handout today.  Please let us know if you have any questions.  Once you are cleared by Cardiology, Dr. Mariah Milling, to have an endoscopic procedure please let us know and you will be scheduled for an EGD w/ dilation with Dr. Myrtie Neither.  Thank you for entrusting me with your care and for choosing [facility], Deanna May, NP   If your blood pressure at your visit was 140/90 or greater, please contact your primary care physician to follow up on this. ______________________________________________________  If you are age 58 or older, your body mass index should be between 23-30. Your Body mass index is 36.79 kg/m. If this is out of the aforementioned range listed, please consider follow up with your Primary Care Provider.  If you are age 71 or younger, your body mass index should be between 19-25. Your Body mass index is 36.79 kg/m. If this is out of the aformentioned range listed, please consider follow up with your Primary Care Provider.  ________________________________________________________  The [place] [facility] providers would like to encourage you to use [facility] to communicate with providers for non-urgent requests or questions.  Due to long hold times on the telephone, sending your provider a message by [facility] may be a faster and more efficient way to get a response.  Please allow 48 business hours for a response.  Please remember that this is for non-urgent requests.  _______________________________________________________  Due to recent changes in healthcare  laws, you may see the results of your imaging and laboratory studies on MyChart before your provider has had a chance to review them.  We understand that in some cases there may be results that are confusing or concerning to you. Not all laboratory results come back in the same time frame and the provider may be waiting for multiple results in order to interpret others.  Please give [place] 48 hours in order for your provider to thoroughly review all the results before contacting the office for clarification of your results.

## 2023-03-30 ENCOUNTER — Telehealth: Payer: Self-pay | Admitting: Neurology

## 2023-03-30 NOTE — Telephone Encounter (Signed)
Alex with [facility] checking status of this refill

## 2023-03-30 NOTE — Telephone Encounter (Signed)
Called patient to confirm whom been refilling her Rx Gabapentin. Patient stated [facility] and Dr Milinda Antis has been refilling her Rx. I mention to her [facility] hasn't been filling her Rx for over a year. Patient responded very arrogantly " Just tell Aundra Millet to fill my medication". I informed patient I would send a note to [facility] and then she hung up.

## 2023-03-30 NOTE — Progress Notes (Signed)
____________________________________________________________  Attending physician addendum:  Thank you for sending this case to me. I have reviewed the entire note and agree with the plan.  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  EGD reasonable to further evaluate for other findings that may be amenable to endoscopic dilation.  Also sounds like some functional lower digestive issues and consider SIBO testing or empiric treatment.  Amada Jupiter, MD  ____________________________________________________________

## 2023-03-31 NOTE — Telephone Encounter (Signed)
I called the pt and LVM (ok per DPR) with office number, asking for call back to discuss her Gabapentin.

## 2023-03-31 NOTE — Telephone Encounter (Signed)
The patient returned my call. We had a pleasant interaction. I did explain to her that I talked with Aundra Millet and she does not mind taking back over the pt's Gabapentin but she has not refilled it in 2 years and the patient cannot jump back and forth between providers for refills on this medication in particular. The patient prefers for [place] to refill it. She states she takes 300 mg strength capsules, 2 at bedtime for her leg pain at night. She requested a 3 month supply and thanked me for the call.

## 2023-03-31 NOTE — Telephone Encounter (Signed)
Can you call patient and advise that I have not refilled gabapentin in 2 years.  I do not mind taking over this prescription however she cannot go between 2 offices to get the prescription refilled.  We also need to verify how she is taking the medication since I have not refilled it in 2 years.

## 2023-04-15 DIAGNOSIS — I10 Essential (primary) hypertension: Secondary | ICD-10-CM | POA: Diagnosis not present

## 2023-04-15 DIAGNOSIS — K219 Gastro-esophageal reflux disease without esophagitis: Secondary | ICD-10-CM | POA: Diagnosis not present

## 2023-04-15 DIAGNOSIS — Z7982 Long term (current) use of aspirin: Secondary | ICD-10-CM | POA: Diagnosis not present

## 2023-04-15 DIAGNOSIS — Z8249 Family history of ischemic heart disease and other diseases of the circulatory system: Secondary | ICD-10-CM | POA: Diagnosis not present

## 2023-04-15 DIAGNOSIS — Z008 Encounter for other general examination: Secondary | ICD-10-CM | POA: Diagnosis not present

## 2023-04-15 DIAGNOSIS — J4489 Other specified chronic obstructive pulmonary disease: Secondary | ICD-10-CM | POA: Diagnosis not present

## 2023-04-15 DIAGNOSIS — J479 Bronchiectasis, uncomplicated: Secondary | ICD-10-CM | POA: Diagnosis not present

## 2023-04-15 DIAGNOSIS — G35 Multiple sclerosis: Secondary | ICD-10-CM | POA: Diagnosis not present

## 2023-04-15 DIAGNOSIS — M1990 Unspecified osteoarthritis, unspecified site: Secondary | ICD-10-CM | POA: Diagnosis not present

## 2023-04-15 DIAGNOSIS — M064 Inflammatory polyarthropathy: Secondary | ICD-10-CM | POA: Diagnosis not present

## 2023-04-15 DIAGNOSIS — J849 Interstitial pulmonary disease, unspecified: Secondary | ICD-10-CM | POA: Diagnosis not present

## 2023-04-15 DIAGNOSIS — R32 Unspecified urinary incontinence: Secondary | ICD-10-CM | POA: Diagnosis not present

## 2023-05-30 NOTE — Progress Notes (Unsigned)
 Cardiology Office Note  Date:  05/31/2023   ID:  [name], Karina [birth year], MRN [id number]  PCP:  Judy Pimple, MD   Chief Complaint  Patient presents with   12 month follow up     Patient c/o not sleeping well, tiredness, mid-sternum chest pain off & on and shortness of breath with over exertion.     HPI:  Karina [name] is a 68 year old woman with a pmh of  long smoking history from age 21 COPD, asthma,  multiple sclerosis,  previous history of chest pain with negative stress test in June 09, 2007 (nuclear treadmill study), previously seen by myself in 2012/06/08 for chest pain, swelling, at that time had a stress test showing no ischemia  who presents for f/u of her chest pain, shortness of breath  Last seen in clinic 2/23 Not sleeping well, tired in the day Takes 2-3 gabapentin , legs hurt, bottom of feet burn Nerve pain in left arm, plates and screws in neck MS getting worse  Off crestor , had itching  Quit smoking, 2 yrs ago, on patches  Rare chest pain On albuterol prn  Unable to tolerate medications for MS Followed by neurology Sites of injection would leave tender lumps  EKG personally reviewed by myself on todays visit EKG Interpretation Date/Time:  Tuesday May 31 2023 10:14:34 EDT Ventricular Rate:  70 PR Interval:  192 QRS Duration:  74 QT Interval:  386 QTC Calculation: 416 R Axis:   62  Text Interpretation: Normal sinus rhythm Normal ECG When compared with ECG of 18-Aug-2020 08:19, No significant change was found Confirmed by Julien Nordmann [phone]) on 05/31/2023 10:38:11 AM   Prior attempts to workup chest pain unsuccessful apart from Myoview Previously declined beta-blocker for cardiac CTA Myoview medication makes her stomach hurt  CT scan chest  mild coronary calcification in LAD, RCA  Reviewed recent events Covid: sept/oct 2022 Sedentary, no regular exercise program Chronic shortness of breath  Prior history chest pain when last seen in clinic  06-09-2015 Severe episode of chest pain 05/10/2015, went away without intervention  Has had cholesterol up to 260 off statins   August 2013 she was started on a new medication for her MS. After being on this for 7 days, she felt poorly. She had fluid retention, abdominal swelling. Medication was held. she continued to have swelling in her upper tremors, legs, feet, stomach, "all over".   Father died in 06/09/2011, dementia   PMH:   has a past medical history of Allergic rhinitis, Allergy, Anxiety, Cataract, Colon polyps (08/2008), COPD (chronic obstructive pulmonary disease) (HCC), Depression, Diabetes mellitus, Gastritis (08/2008), GERD (gastroesophageal reflux disease), Hypercholesteremia, Lung nodule, Menopausal disorder, Multiple sclerosis (HCC), Neuromuscular disorder (HCC), Plantar fasciitis, Seizures (HCC), Shingles, Urinary incontinence, and Vertigo.  PSH:    Past Surgical History:  Procedure Laterality Date   CATARACT EXTRACTION Bilateral Jun 08, 2021   CERVICAL DISCECTOMY  03/08/2002   x 2, Anterior; Fusion C4-5, C5-6, C6-7, Dr. Donalee Citrin   CHEST CT  11/06/2008   With small 4 mm nodule L lung base (rec re check in 1 year)   CHEST CT  07/06/2009   Re check chest CT - lung nodule stable   CHOLECYSTECTOMY     COLONOSCOPY  08/06/2008   Polyps/ re check 5 yrs   CT SINUS LTD W/O CM  02/05/2009   negative   DILATION AND CURETTAGE OF UTERUS     ESOPHAGOGASTRODUODENOSCOPY  08/06/2008   Erosive gastritis, h pylori (treated)   FOOT  SURGERY     left plantar fascial problem   ROTATOR CUFF REPAIR     right   TONSILLECTOMY     TUBAL LIGATION     UPPER GASTROINTESTINAL ENDOSCOPY      Current Outpatient Medications  Medication Sig Dispense Refill   albuterol (VENTOLIN HFA) 108 (90 Base) MCG/ACT inhaler INHALE 2 PUFFS UP TO EVERY 4 HOURS AS NEEDED FOR WHEEZING 3 each 3   ALPRAZolam (XANAX) 0.5 MG tablet Take 1 tablet (0.5 mg total) by mouth 2 (two) times daily as needed. For anxiety. 30 tablet 0    aspirin EC 81 MG tablet Take 1 tablet (81 mg total) by mouth daily. Swallow whole. 90 tablet 3   budesonide-formoterol (SYMBICORT) 160-4.5 MCG/ACT inhaler Inhale 2 puffs into the lungs 2 (two) times daily. (Patient taking differently: Inhale 2 puffs into the lungs 2 (two) times daily as needed.) 3 each 1   cholecalciferol (VITAMIN D3) 25 MCG (1000 UNIT) tablet Take 1,000 Units by mouth daily.     famotidine (PEPCID) 20 MG tablet Take 1 tablet (20 mg total) by mouth daily. Take before dinner 180 tablet 0   fluticasone (FLONASE) 50 MCG/ACT nasal spray Place 2 sprays into both nostrils daily. (Patient taking differently: Place 2 sprays into both nostrils daily as needed.) 16 g 11   gabapentin (NEURONTIN) 300 MG capsule Take 2 capsules (600 mg total) by mouth at bedtime. 180 capsule 1   ipratropium-albuterol (DUONEB) 0.5-2.5 (3) MG/3ML SOLN TAKE 3 MLS BY NEBULIZATION EVERY 6 (SIX) HOURS AS NEEDED. 360 mL 0   methylcellulose (CITRUCEL) oral powder 1 tsp daily     Multiple Vitamin (MULTIVITAMIN) tablet Take 1 tablet by mouth daily.     nicotine (NICODERM CQ - DOSED IN MG/24 HOURS) 21 mg/24hr patch Place 21 mg onto the skin daily.     pantoprazole (PROTONIX) 40 MG tablet Take 1 tablet (40 mg total) by mouth daily before breakfast.     rosuvastatin (CRESTOR) 10 MG tablet Take 1 tablet (10 mg total) by mouth daily. 90 tablet 3   Spacer/Aero-Holding Chambers (AEROCHAMBER MV) inhaler Use as instructed 1 each 0   promethazine-dextromethorphan (PROMETHAZINE-DM) 6.25-15 MG/5ML syrup Take 5 mLs by mouth 3 (three) times daily as needed for cough. Caution of sedation (Patient not taking: Reported on 05/31/2023) 118 mL 0   No current facility-administered medications for this visit.    Allergies:   Atorvastatin, Percocet [oxycodone-acetaminophen], Tecfidera [dimethyl fumarate], Amitriptyline hcl, Betaseron [interferon beta-1b], Duloxetine, Pregabalin, Zoloft [sertraline hcl], Crestor [rosuvastatin], Clarithromycin,  Levofloxacin, and Pseudoephedrine   Social History:  The patient  reports that she quit smoking about 2 years ago. Her smoking use included cigarettes. She started smoking about 32 years ago. She has a 60 pack-year smoking history. She has never used smokeless tobacco. She reports that she does not drink alcohol and does not use drugs.   Family History:   family history includes Colon cancer (age of onset: 48) in her maternal grandfather; Coronary artery disease in her cousin, father, mother, and paternal grandmother; Diabetes in her father; Heart attack in her mother; Lung cancer in her paternal aunt; Migraines in her mother; Renal Disease in her father; Stroke in her father.    Review of Systems: Review of Systems  Constitutional: Negative.   HENT: Negative.    Respiratory: Negative.    Cardiovascular:  Positive for chest pain.  Gastrointestinal: Negative.   Musculoskeletal: Negative.   Neurological: Negative.   Psychiatric/Behavioral: Negative.    All other  systems reviewed and are negative.    PHYSICAL EXAM: VS:  BP 132/84 (BP Location: Left Arm, Patient Position: Sitting, Cuff Size: Normal)   Pulse 70   Ht 5' (1.524 m)   Wt 183 lb (83 kg)   SpO2 95%   BMI 35.74 kg/m  , BMI Body mass index is 35.74 kg/m. GEN: Well nourished, well developed, in no acute distress HEENT: normal Neck: no JVD, carotid bruits, or masses Cardiac: RRR; no murmurs, rubs, or gallops,no edema  Respiratory:  clear to auscultation bilaterally, normal work of breathing GI: soft, nontender, nondistended, + BS MS: no deformity or atrophy Skin: warm and dry, no rash Neuro:  Strength and sensation are intact Psych: euthymic mood, full affect  Recent Labs: 07/26/2022: TSH 2.84 02/22/2023: ALT 12; BUN 9; Creatinine, Ser 1.10; Hemoglobin 14.3; Platelets 241.0; Potassium 4.4; Sodium 139    Lipid Panel Lab Results  Component Value Date   CHOL 267 (H) 07/26/2022   HDL 77.10 07/26/2022   LDLCALC 170 (H)  07/26/2022   TRIG 101.0 07/26/2022      Wt Readings from Last 3 Encounters:  05/31/23 183 lb (83 kg)  03/29/23 188 lb 6.4 oz (85.5 kg)  02/22/23 187 lb (84.8 kg)      ASSESSMENT AND PLAN:  Problem List Items Addressed This Visit       Cardiology Problems   HYPERCHOLESTEROLEMIA   Relevant Medications   rosuvastatin (CRESTOR) 10 MG tablet   Coronary artery calcification - Primary   Relevant Medications   rosuvastatin (CRESTOR) 10 MG tablet   Other Relevant Orders   EKG 12-Lead (Completed)     Other   Asthmatic bronchitis , chronic (HCC)   Prediabetes   Chronic chest pain Dating back many years, symptoms seem to improve after she stopped smoking Prior stress test with no ischemia Symptoms mild at this time Previously declined cardiac CTA as she did not want to take preimaging metoprolol Previously did not want stress test, medication hurt her stomach Given chest pain symptoms essentially resolved, no further workup at this time  Hyperlipidemia Continues to stop her Crestor, preferred to be on branded Crestor Recommend she retry generic Crestor/rosuvastatin 10 Total cholesterol running 260  Smoker Reports that she quit 2 years ago up to 3 years ago, uses nicotine patches as needed  COPD Smoking since age 86 quit 2 to 3 years ago Continued use of smoking patches  Multiple sclerosis Currently untreated followed by neurology With neuropathy  Chronic insomnia On gabapentin, does not seem to be working     Signed, Dossie Arbour, M.D., Ph.D. [facility] [place], [place] [phone]

## 2023-05-31 ENCOUNTER — Ambulatory Visit: Payer: Medicare HMO | Attending: Cardiovascular Disease | Admitting: Cardiovascular Disease

## 2023-05-31 ENCOUNTER — Encounter: Payer: Self-pay | Admitting: Cardiovascular Disease

## 2023-05-31 VITALS — BP 132/84 | HR 70 | Ht 60.0 in | Wt 183.0 lb

## 2023-05-31 DIAGNOSIS — R7303 Prediabetes: Secondary | ICD-10-CM

## 2023-05-31 DIAGNOSIS — J4489 Other specified chronic obstructive pulmonary disease: Secondary | ICD-10-CM | POA: Diagnosis not present

## 2023-05-31 DIAGNOSIS — I2510 Atherosclerotic heart disease of native coronary artery without angina pectoris: Secondary | ICD-10-CM | POA: Diagnosis not present

## 2023-05-31 DIAGNOSIS — E7800 Pure hypercholesterolemia, unspecified: Secondary | ICD-10-CM

## 2023-05-31 MED ORDER — ROSUVASTATIN CALCIUM 10 MG PO TABS: 10.0000 mg | ORAL_TABLET | Freq: Every day | ORAL | 3 refills | Status: AC

## 2023-05-31 NOTE — Patient Instructions (Signed)
 Medication Instructions:  Please restart crestor 10 mg daily  If you need a refill on your cardiac medications before your next appointment, please call your pharmacy.   Lab work: No new labs needed  Testing/Procedures: No new testing needed  Follow-Up: At [facility] you and your health needs are our priority.  As part of our continuing mission to provide you with exceptional heart care, we have created designated Provider Care Teams.  These Care Teams include your primary Cardiologist (physician) and Advanced Practice Providers (APPs -  Physician Assistants and Nurse Practitioners) who all work together to provide you with the care you need, when you need it.  You will need a follow up appointment in 12 months  Providers on your designated Care Team:   Nicolasa Ducking, NP Eula Listen, PA-C Cadence Fransico Michael, [place]  COVID-19 Vaccine Information can be found at: [email or web address] For questions related to vaccine distribution or appointments, please email [email or web address] or call [phone].

## 2023-06-01 ENCOUNTER — Telehealth: Payer: Self-pay

## 2023-06-01 NOTE — Telephone Encounter (Signed)
 MyChart message to patient re: scheduling  EGD with Dr. Myrtie Neither

## 2023-06-01 NOTE — Telephone Encounter (Signed)
-----   Message from [facility] Marylu Lund H sent at 04/25/2023  5:33 PM EST ----- Regarding: FW: cardiology appt Patient has an appointment on 3-25 ----- Message ----- From: Cooper Render, CMA Sent: 04/25/2023  12:00 AM EST To: Cooper Render, CMA Subject: cardiology appt                                See if patient made an appointment with [facility] (Dr. Mariah Milling) for chest pain.  Once cleared she can be scheduled for EGD with Dil with Dr. Myrtie Neither.   Patient saw Deanna in the office on 1-21

## 2023-06-16 NOTE — Telephone Encounter (Signed)
 Called and spoke to patient.  She said she is feeling better - the [employer] is really helping. She is not interested in scheduling an EGD at this time but will call us that changes or if she needs anything.

## 2023-07-07 ENCOUNTER — Ambulatory Visit: Payer: Medicare HMO | Admitting: Adult Health

## 2023-07-07 ENCOUNTER — Encounter: Payer: Self-pay | Admitting: Adult Health

## 2023-07-07 VITALS — BP 119/84 | HR 85 | Ht 60.0 in | Wt 182.8 lb

## 2023-07-07 DIAGNOSIS — R5383 Other fatigue: Secondary | ICD-10-CM

## 2023-07-07 DIAGNOSIS — R413 Other amnesia: Secondary | ICD-10-CM | POA: Diagnosis not present

## 2023-07-07 DIAGNOSIS — G35 Multiple sclerosis: Secondary | ICD-10-CM | POA: Diagnosis not present

## 2023-07-07 DIAGNOSIS — R400 Somnolence: Secondary | ICD-10-CM

## 2023-07-07 MED ORDER — MODAFINIL 200 MG PO TABS: 100.0000 mg | ORAL_TABLET | Freq: Every day | ORAL | 5 refills | Status: DC

## 2023-07-07 NOTE — Progress Notes (Signed)
 PATIENT: Karina [name] DOB: 07/25/1955  REASON FOR VISIT: follow up HISTORY FROM: patient PRIMARY NEUROLOGIST: Dr. Godwin Lat  Chief Complaint  Patient presents with   Room 19    Pt is here Alone. Pt states that everything has been okay with her MS since her last appointment other than her having a lot of Fatigue.      HISTORY OF PRESENT ILLNESS: Today 07/07/23   Karina [name] is a 68 y.o. female with a history of multiple sclerosis. Returns today for follow-up.  Patient remains off disease modifying therapy.   She denies any new symptoms since her last visit.  She states  that she does have a lot of fatigue but that has been ongoing.  In the past Dr. Godwin Lat has provided her with a prescription of Provigil  but she opted not to try this.  She states that she is still struggling with fatigue.  Reports that when she wakes up in the morning after she gets ready she is so tired that she does not want to go do anything.  She is not sure if she snores.  Never had a sleep study.  Denies any new numbness or weakness.  She has been on gabapentin  600 mg at bedtime for discomfort in the hip and burning and tingling pain in the legs and feet.  The symptoms are not new.  She states on occasion she will take 3 tablets of gabapentin  to help her with sleep.  No change in gait or balance.  No change in vision.  No change in bowels or bladder.  In the past we have tried to repeat the MRI to evaluate for progression of MS.  However she has not been able to complete because she has a chronic cough.  She states that she tends to cough up phlegm and reports that that is hard to do when she is in an MRI machine.  Today she also has some cognitive complaints.  Reports that she has trouble remembering names and places.  Able to complete all ADLs independently.  She does operate a motor vehicle without difficulty.  Denies any significant change in her mood or behavior.  She is able to manage her own medications and  appointments.  She manages all of her own finances.  Returns today for an evaluation.   8/29/24Tammy M [name] is a 68 y.o. female who has been followed in this office for multiple sclerosis. Returns today for follow-up.  She is not on any disease modifying therapy.  At the last visit she was given a prescription was given for provigil - she never took. Feels that fatigue is manageable.  She was also given a prescription for oxybutynin  for urinary incontinence however she feels that this is stable as well.  She states that she only has urinary incontinence that she last.  She does not feel that she needs medication.  Denies any new symptoms.  No new numbness weakness.  No changes in gait or balance.  No visual changes.  She was unable to do an MRI because she cannot tolerate the wire cage going over her face.  She states that she consul he has to cough and spit up phlegm.   HISTORY  had the pleasure of seeing Karina [name] at [facility] for her multiple sclerosis.   She is a 68 year old woman who was diagnosed with MS in 1996.  She has not been on any disease modifying therapy since 2012.   MS History:  She was diagnosed in 1996 after presenting with syncope.   She had an MRI that was c/w MS and then had a LP.   CSF was reportedly c/w MS.  She saw Dr. Dania Dupre initially and was placed on Betaseron.  She had a lot of knots in her skin so went on Tecfidera aroud 2012 but felt weaker and had reduced BP so it was stopped.   She has not been on a DMT since.   Currently, she reports a lot of issues, some related to her MS.   She tires out as she walks further so feels she could just do 1/2 mile.   She denies weakness in individual muscles but feels muscles tire out quickly.  She has spasms, cramps and myalgias.    She has tingling in her feet when she walks.  She has had reduce vision OD for many years.   She was told the right eye has a ' football shape' (astigmatism?)   She denies difficulty  with color vision.   No diplopia.    She has stress incontinence so wears pads.   She has never bene on a medication for her bladder.   She has fatigue.  Mood is fine.   Cognition is mildly impared with soe STm issues, especially names.   She has insomnia, helped only a bit by gabapentin .      She notes a mild tremor in her hands   She has a lot of phlegm and has had difficulty laying down for the MRI. The "cage" coil made it difficult for her to do and she pulle dout quickly before imageing could be done last 2 times.       REVIEW OF SYSTEMS: Out of a complete 14 system review of symptoms, the patient complains only of the following symptoms, and all other reviewed systems are negative.  ALLERGIES: Allergies  Allergen Reactions   Atorvastatin Other (See Comments)    elevated LFT's   Percocet [Oxycodone-Acetaminophen ] Shortness Of Breath    Felt like going to pass out and sweating   Tecfidera [Dimethyl Fumarate] Shortness Of Breath, Palpitations, Rash and Cough   Amitriptyline Hcl Itching and Swelling   Betaseron [Interferon Beta-1b] Other (See Comments)    Headache   Duloxetine Other (See Comments)    pain   Pregabalin Other (See Comments)    LE swelling   Zoloft  [Sertraline  Hcl] Other (See Comments)    Headaches    Crestor  [Rosuvastatin ] Other (See Comments)    Muscle aches and cramps to generic crestor  and other statins   Clarithromycin Other (See Comments)    reaction not known   Levofloxacin Rash   Pseudoephedrine Other (See Comments)    legs hurt    HOME MEDICATIONS: Outpatient Medications Prior to Visit  Medication Sig Dispense Refill   albuterol  (VENTOLIN  HFA) 108 (90 Base) MCG/ACT inhaler INHALE 2 PUFFS UP TO EVERY 4 HOURS AS NEEDED FOR WHEEZING 3 each 3   ALPRAZolam  (XANAX ) 0.5 MG tablet Take 1 tablet (0.5 mg total) by mouth 2 (two) times daily as needed. For anxiety. 30 tablet 0   aspirin  EC 81 MG tablet Take 1 tablet (81 mg total) by mouth daily. Swallow whole.  90 tablet 3   budesonide -formoterol  (SYMBICORT ) 160-4.5 MCG/ACT inhaler Inhale 2 puffs into the lungs 2 (two) times daily. (Patient taking differently: Inhale 2 puffs into the lungs 2 (two) times daily as needed.) 3 each 1   cholecalciferol (VITAMIN D3) 25 MCG (1000 UNIT) tablet  Take 1,000 Units by mouth daily.     famotidine  (PEPCID ) 20 MG tablet Take 1 tablet (20 mg total) by mouth daily. Take before dinner 180 tablet 0   fluticasone  (FLONASE ) 50 MCG/ACT nasal spray Place 2 sprays into both nostrils daily. (Patient taking differently: Place 2 sprays into both nostrils daily as needed.) 16 g 11   gabapentin  (NEURONTIN ) 300 MG capsule Take 2 capsules (600 mg total) by mouth at bedtime. 180 capsule 1   ipratropium-albuterol  (DUONEB) 0.5-2.5 (3) MG/3ML SOLN TAKE 3 MLS BY NEBULIZATION EVERY 6 (SIX) HOURS AS NEEDED. 360 mL 0   methylcellulose (CITRUCEL) oral powder 1 tsp daily     Multiple Vitamin (MULTIVITAMIN) tablet Take 1 tablet by mouth daily.     nicotine (NICODERM CQ - DOSED IN MG/24 HOURS) 21 mg/24hr patch Place 21 mg onto the skin daily.     pantoprazole  (PROTONIX ) 40 MG tablet Take 1 tablet (40 mg total) by mouth daily before breakfast.     rosuvastatin  (CRESTOR ) 10 MG tablet Take 1 tablet (10 mg total) by mouth daily. 90 tablet 3   Spacer/Aero-Holding Chambers (AEROCHAMBER MV) inhaler Use as instructed 1 each 0   promethazine -dextromethorphan (PROMETHAZINE -DM) 6.25-15 MG/5ML syrup Take 5 mLs by mouth 3 (three) times daily as needed for cough. Caution of sedation (Patient not taking: Reported on 03/29/2023) 118 mL 0   No facility-administered medications prior to visit.    PAST MEDICAL HISTORY: Past Medical History:  Diagnosis Date   Allergic rhinitis    Allergy    Anxiety    Councelor- Ulyess Gammons, no per pt   Cataract    Colon polyps 08/2008   Adenomatous   COPD (chronic obstructive pulmonary disease) (HCC)    Depression    no per pt   Diabetes mellitus    Gastritis 08/2008    H. pylori   GERD (gastroesophageal reflux disease)    Hypercholesteremia    Lung nodule    Menopausal disorder    Multiple sclerosis (HCC)    Neuromuscular disorder (HCC)    Plantar fasciitis    Seizures (HCC)    1 seizure in 2001 due to blood sugar dropping to 38, none since   Shingles    Left V1   Urinary incontinence    Vertigo     PAST SURGICAL HISTORY: Past Surgical History:  Procedure Laterality Date   CATARACT EXTRACTION Bilateral 2023   CERVICAL DISCECTOMY  03/08/2002   x 2, Anterior; Fusion C4-5, C5-6, C6-7, Dr. Gearl Keens   CHEST CT  11/06/2008   With small 4 mm nodule L lung base (rec re check in 1 year)   CHEST CT  07/06/2009   Re check chest CT - lung nodule stable   CHOLECYSTECTOMY     COLONOSCOPY  08/06/2008   Polyps/ re check 5 yrs   CT SINUS LTD W/O CM  02/05/2009   negative   DILATION AND CURETTAGE OF UTERUS     ESOPHAGOGASTRODUODENOSCOPY  08/06/2008   Erosive gastritis, h pylori (treated)   FOOT SURGERY     left plantar fascial problem   ROTATOR CUFF REPAIR     right   TONSILLECTOMY     TUBAL LIGATION     UPPER GASTROINTESTINAL ENDOSCOPY      FAMILY HISTORY: Family History  Problem Relation Age of Onset   Migraines Mother    Heart attack Mother    Coronary artery disease Mother    Coronary artery disease Father  6 bypasses    Stroke Father    Diabetes Father    Renal Disease Father    Colon cancer Maternal Grandfather 54   Coronary artery disease Paternal Grandmother    Lung cancer Paternal Aunt    Coronary artery disease Cousin    Multiple sclerosis Neg Hx    Esophageal cancer Neg Hx    Rectal cancer Neg Hx    Stomach cancer Neg Hx    Breast cancer Neg Hx     SOCIAL HISTORY: Social History   Socioeconomic History   Marital status: Married    Spouse name: Not on file   Number of children: 3   Years of education: 9 th   Highest education level: Not on file  Occupational History   Occupation: Disabled     Employer:  DISABLED  Tobacco Use   Smoking status: Former    Current packs/day: 0.00    Average packs/day: 2.0 packs/day for 30.0 years (60.0 ttl pk-yrs)    Types: Cigarettes    Start date: 05/07/1991    Quit date: 05/06/2021    Years since quitting: 2.1   Smokeless tobacco: Never  Vaping Use   Vaping status: Never Used  Substance and Sexual Activity   Alcohol use: No    Alcohol/week: 0.0 standard drinks of alcohol   Drug use: No   Sexual activity: Not on file  Other Topics Concern   Not on file  Social History Narrative   Married with 3 children   Disabled.   Education 9th grade    Caffeine one cup of coffee daily.   Patient is right handed.    Social Drivers of Corporate investment banker Strain: Low Risk  (09/30/2022)   Overall Financial Resource Strain (CARDIA)    Difficulty of Paying Living Expenses: Not hard at all  Food Insecurity: No Food Insecurity (09/30/2022)   Hunger Vital Sign    Worried About Running Out of Food in the Last Year: Never true    Ran Out of Food in the Last Year: Never true  Transportation Needs: No Transportation Needs (09/30/2022)   PRAPARE - Administrator, Civil Service (Medical): No    Lack of Transportation (Non-Medical): No  Physical Activity: Inactive (09/30/2022)   Exercise Vital Sign    Days of Exercise per Week: 0 days    Minutes of Exercise per Session: 0 min  Stress: Stress Concern Present (09/30/2022)   [employer] of Occupational Health - Occupational Stress Questionnaire    Feeling of Stress : Very much  Social Connections: Moderately Isolated (09/30/2022)   Social Connection and Isolation Panel [NHANES]    Frequency of Communication with Friends and Family: Never    Frequency of Social Gatherings with Friends and Family: Never    Attends Religious Services: More than 4 times per year    Active Member of [employer] or Organizations: No    Attends Banker Meetings: Never    Marital Status: Married  Catering manager  Violence: Not At Risk (09/30/2022)   Humiliation, Afraid, Rape, and Kick questionnaire    Fear of Current or Ex-Partner: No    Emotionally Abused: No    Physically Abused: No    Sexually Abused: No      PHYSICAL EXAM  Vitals:   07/07/23 1059  BP: 119/84  Pulse: 85  Weight: 182 lb 12.8 oz (82.9 kg)  Height: 5' (1.524 m)   Body mass index is 35.7 kg/m.  Generalized: Well  developed, in no acute distress   Neurological examination  Mentation: Alert oriented to time, place, history taking. Follows all commands speech and language fluent Cranial nerve II-XII: Pupils were equal round reactive to light. Extraocular movements were full, visual field were full on confrontational test. Facial sensation and strength were normal. Head turning and shoulder shrug  were normal and symmetric. Motor: The motor testing reveals 5 over 5 strength of all 4 extremities. Good symmetric motor tone is noted throughout.  Sensory: Sensory testing is intact to soft touch on all 4 extremities. No evidence of extinction is noted.  Coordination: Cerebellar testing reveals good finger-nose-finger and heel-to-shin bilaterally.  Gait and station: Gait is normal. Tandem gait not attempted Romberg is negative. No drift is seen.  Reflexes: Deep tendon reflexes are symmetric and normal bilaterally.   DIAGNOSTIC DATA (LABS, IMAGING, TESTING) - I reviewed patient records, labs, notes, testing and imaging myself where available.  Lab Results  Component Value Date   WBC 7.2 02/22/2023   HGB 14.3 02/22/2023   HCT 43.9 02/22/2023   MCV 95.6 02/22/2023   PLT 241.0 02/22/2023      Component Value Date/Time   NA 139 02/22/2023 0829   NA 136 02/23/2021 1041   NA 136 07/09/2013 1803   K 4.4 02/22/2023 0829   K 4.2 07/09/2013 1803   CL 99 02/22/2023 0829   CL 105 07/09/2013 1803   CO2 34 (H) 02/22/2023 0829   CO2 22 07/09/2013 1803   GLUCOSE 106 (H) 02/22/2023 0829   GLUCOSE 96 07/09/2013 1803   BUN 9  02/22/2023 0829   BUN 7 (L) 02/23/2021 1041   BUN 16 07/09/2013 1803   CREATININE 1.10 02/22/2023 0829   CREATININE 1.20 (H) 04/20/2019 1444   CALCIUM  9.6 02/22/2023 0829   CALCIUM  9.6 07/09/2013 1803   PROT 6.7 02/22/2023 0829   PROT 6.9 11/21/2014 1137   ALBUMIN 4.2 02/22/2023 0829   ALBUMIN 4.3 11/21/2014 1137   AST 16 02/22/2023 0829   ALT 12 02/22/2023 0829   ALKPHOS 76 02/22/2023 0829   BILITOT 0.6 02/22/2023 0829   BILITOT 0.3 11/21/2014 1137   GFRNONAA >60 08/18/2020 0835   GFRNONAA 55 (L) 07/09/2013 1803   GFRAA >60 08/22/2019 1602   GFRAA >60 07/09/2013 1803   Lab Results  Component Value Date   CHOL 267 (H) 07/26/2022   HDL 77.10 07/26/2022   LDLCALC 170 (H) 07/26/2022   LDLDIRECT 206.5 01/24/2013   TRIG 101.0 07/26/2022   CHOLHDL 3 07/26/2022   Lab Results  Component Value Date   HGBA1C 5.8 07/26/2022   Lab Results  Component Value Date   VITAMINB12 259 05/07/2021   Lab Results  Component Value Date   TSH 2.84 07/26/2022      ASSESSMENT AND PLAN 68 y.o. year old female  has a past medical history of Allergic rhinitis, Allergy, Anxiety, Cataract, Colon polyps (08/2008), COPD (chronic obstructive pulmonary disease) (HCC), Depression, Diabetes mellitus, Gastritis (08/2008), GERD (gastroesophageal reflux disease), Hypercholesteremia, Lung nodule, Menopausal disorder, Multiple sclerosis (HCC), Neuromuscular disorder (HCC), Plantar fasciitis, Seizures (HCC), Shingles, Urinary incontinence, and Vertigo. here with:  Multiple sclerosis Memory disturbance Daytime sleepiness/fatigue  - Remains off disease modifying therapy - She is amenable to retrying MRI of the brain with and without contrast.  I have ordered open MRI for her. - Continue gabapentin  600 mg at bedtime. - MMSE 29/30 - Will check lab work to rule out any reversible causes of memory disturbance - Will order home sleep  test to rule out sleep apnea as a potential cause for daytime sleepiness and  fatigue and memory complaints - Start Provigil -start with half a tablet (100 mg) in the morning.  Advised patient that if this is not beneficial for daytime sleepiness and fatigue she could increase to the full tablet which would be 200 mg daily - Follow-up in 6 months or sooner if needed      Clem Currier, MSN, NP-C 07/07/2023, 11:04 AM [facility] [address] [place], [place] [address] [phone]

## 2023-07-07 NOTE — Patient Instructions (Addendum)
 Start Provigil  1/2 tablet in the AM. If that is not beneficial can take full tablet Blood work today to rule out reversible causes of memory complaints Home sleep test ordered MRI of the brain with and without contrast Continue gabapentin  600 mg at bedtime

## 2023-07-08 ENCOUNTER — Other Ambulatory Visit (HOSPITAL_COMMUNITY): Payer: Self-pay

## 2023-07-08 ENCOUNTER — Telehealth: Payer: Self-pay

## 2023-07-08 LAB — CBC WITH DIFFERENTIAL/PLATELET
Basophils Absolute: 0.1 10*3/uL (ref 0.0–0.2)
Basos: 1 %
EOS (ABSOLUTE): 0.2 10*3/uL (ref 0.0–0.4)
Eos: 2 %
Hematocrit: 46.8 % — ABNORMAL HIGH (ref 34.0–46.6)
Hemoglobin: 15.2 g/dL (ref 11.1–15.9)
Immature Grans (Abs): 0 10*3/uL (ref 0.0–0.1)
Immature Granulocytes: 0 %
Lymphocytes Absolute: 2.4 10*3/uL (ref 0.7–3.1)
Lymphs: 34 %
MCH: 31.1 pg (ref 26.6–33.0)
MCHC: 32.5 g/dL (ref 31.5–35.7)
MCV: 96 fL (ref 79–97)
Monocytes Absolute: 0.5 10*3/uL (ref 0.1–0.9)
Monocytes: 7 %
Neutrophils Absolute: 3.9 10*3/uL (ref 1.4–7.0)
Neutrophils: 56 %
Platelets: 235 10*3/uL (ref 150–450)
RBC: 4.89 x10E6/uL (ref 3.77–5.28)
RDW: 12.5 % (ref 11.7–15.4)
WBC: 7.2 10*3/uL (ref 3.4–10.8)

## 2023-07-08 LAB — COMPREHENSIVE METABOLIC PANEL WITH GFR
ALT: 13 IU/L (ref 0–32)
AST: 18 IU/L (ref 0–40)
Albumin: 4.6 g/dL (ref 3.9–4.9)
Alkaline Phosphatase: 87 IU/L (ref 44–121)
BUN/Creatinine Ratio: 9 — ABNORMAL LOW (ref 12–28)
BUN: 10 mg/dL (ref 8–27)
Bilirubin Total: 0.3 mg/dL (ref 0.0–1.2)
CO2: 26 mmol/L (ref 20–29)
Calcium: 9.9 mg/dL (ref 8.7–10.3)
Chloride: 100 mmol/L (ref 96–106)
Creatinine, Ser: 1.15 mg/dL — ABNORMAL HIGH (ref 0.57–1.00)
Globulin, Total: 2.7 g/dL (ref 1.5–4.5)
Glucose: 75 mg/dL (ref 70–99)
Potassium: 5.2 mmol/L (ref 3.5–5.2)
Sodium: 139 mmol/L (ref 134–144)
Total Protein: 7.3 g/dL (ref 6.0–8.5)
eGFR: 53 mL/min/{1.73_m2} — ABNORMAL LOW (ref >59)

## 2023-07-08 LAB — DEMENTIA PANEL
Homocysteine: 22.2 umol/L — ABNORMAL HIGH (ref 0.0–17.2)
RPR Ser Ql: NONREACTIVE
TSH: 1.23 u[IU]/mL (ref 0.450–4.500)
Vitamin B-12: 340 pg/mL (ref 232–1245)

## 2023-07-08 NOTE — Telephone Encounter (Signed)
 Pharmacy Patient Advocate Encounter   Received notification from CoverMyMeds that prior authorization for Modafinil  is required/requested.   Insurance verification completed.   The patient is insured through [facility] .   Per test claim: PA required; PA submitted to above mentioned insurance via CoverMyMeds Key/confirmation #/EOC [id number] Status is pending

## 2023-07-12 NOTE — Telephone Encounter (Signed)
 Pharmacy Patient Advocate Encounter  Received notification from [employer] that Prior Authorization for Modafinil  has been DENIED.  Full denial letter will be uploaded to the media tab. See denial reason below.  Your plan does not allow coverage of this mediation based on your prescriber answering No to the following questions:  Does the patient have a diagnosis of excessive sleepiness associated with narcolepsy? Does the patient have a diagnosis of excessive sleepiness associated with Shift Work Disorder (SWD)? Does the patient have a diagnosis of excessive sleepiness associated with obstructive sleep apnea (OSA)? Is the requested drug being prescribed for the treatment of idiopathic hypersomnia (IH)?  PA #/Case ID/Reference #: [id number]

## 2023-07-13 ENCOUNTER — Telehealth: Payer: Self-pay | Admitting: *Deleted

## 2023-07-13 NOTE — Telephone Encounter (Signed)
 I called pt and relayed results of her labs per below and made recommendations listed.  Pt verbalized understanding.  (Take otc vit b complex (follow instructions, and ok to proceed with MRI drink plenty of water before and after MRI).

## 2023-07-13 NOTE — Telephone Encounter (Signed)
-----   Message from [facility] sent at 07/12/2023  4:59 PM EDT ----- Please advise patient that her homocystine was slightly elevated.  I did discuss with Dr. Gracie Lav who recommended that she take a vitamin B complex vitamin.  She can get this over-the-counter and follow instructions on the bottle.  Also her creatinine and GFR is just slightly decreased.  Okay to continue with MRI with contrast per Dr. Gracie Lav.  She just needs to drink plenty of water before and after.

## 2023-07-14 ENCOUNTER — Telehealth: Payer: Self-pay | Admitting: Adult Health

## 2023-07-14 NOTE — Telephone Encounter (Signed)
 [employer] medicare Karina [name]: [id number] exp. 07/14/23-01/10/24 sent to [facility] for an open MRI. [phone]

## 2023-07-18 NOTE — Telephone Encounter (Signed)
 I called pt and LVM (ok per DPR) informing her that insurance denied the Modafinil  again. She can use good rx coupon at her pharmacy or let us  know and we can move her Rx to another pharmacy if she finds it cheaper. I left our office number for call back.

## 2023-07-18 NOTE — Telephone Encounter (Signed)
 Last year it was denied as well and the option of Good Rx coupon was offered to patient. Will offer good rx or if patient has other discount drug card she can use it.

## 2023-07-26 ENCOUNTER — Ambulatory Visit (INDEPENDENT_AMBULATORY_CARE_PROVIDER_SITE_OTHER): Admitting: Neurology

## 2023-07-26 DIAGNOSIS — G4733 Obstructive sleep apnea (adult) (pediatric): Secondary | ICD-10-CM

## 2023-07-26 DIAGNOSIS — R0683 Snoring: Secondary | ICD-10-CM

## 2023-07-26 DIAGNOSIS — R400 Somnolence: Secondary | ICD-10-CM

## 2023-07-26 DIAGNOSIS — R5383 Other fatigue: Secondary | ICD-10-CM

## 2023-07-27 NOTE — Progress Notes (Unsigned)
 Karina [name]

## 2023-08-03 ENCOUNTER — Telehealth: Payer: Self-pay | Admitting: *Deleted

## 2023-08-03 ENCOUNTER — Other Ambulatory Visit: Payer: Self-pay | Admitting: *Deleted

## 2023-08-03 DIAGNOSIS — G4733 Obstructive sleep apnea (adult) (pediatric): Secondary | ICD-10-CM

## 2023-08-03 NOTE — Telephone Encounter (Signed)
 Called pt. Relayed results. Pt aware we will send order to [facility] and they will contact her in the next week. Provided her their phone# [phone]. Scheduled initial CPAP f/u for 10/31/23 at 2pm with Dr. Godwin Lat. Cx appt 02/2024. Community message sent to [facility] that order placed.

## 2023-08-03 NOTE — Telephone Encounter (Signed)
-----   Message from Jorie Newness sent at 08/02/2023  8:02 PM EDT ----- Regarding: HST Please let her know she has moderate OSA and I recommend AutoPAP 5-15 cm pressure with heated humidifier.

## 2023-08-23 DIAGNOSIS — Z961 Presence of intraocular lens: Secondary | ICD-10-CM | POA: Diagnosis not present

## 2023-08-29 NOTE — Telephone Encounter (Addendum)
 I called pt and provided update. She verbalized understanding and will let us  know if she has worsening sx moving forward.

## 2023-09-18 NOTE — Progress Notes (Deleted)
     [name], MD, CAQ Sports Medicine [facility] [address] [place], [address]  Phone: [phone]  FAX: [phone]  Karina [name] - 68 y.o. female  MRN [id number]  Date of Birth: 1955/08/27  Date: 09/19/2023  PCP: Randeen Laine LABOR, MD  Referral: Randeen Laine LABOR, MD  No chief complaint on file.  Subjective:   Karina [name] is a 68 y.o. very pleasant female patient with There is no height or weight on file to calculate BMI. who presents with the following:  Karina [name] is a very nice patient, who I have known well over the years.  She does have a history of multiple sclerosis.  She also has a history of rheumatoid arthritis.  She presents with some severe low back pain.  Review of Systems is noted in the HPI, as appropriate  Objective:   There were no vitals taken for this visit.  GEN: No acute distress; alert,appropriate. PULM: Breathing comfortably in no respiratory distress PSYCH: Normally interactive.   Laboratory and Imaging Data:  Assessment and Plan:   ***

## 2023-09-19 ENCOUNTER — Ambulatory Visit: Admitting: Family Medicine

## 2023-09-22 ENCOUNTER — Other Ambulatory Visit: Payer: Self-pay | Admitting: Adult Health

## 2023-09-22 ENCOUNTER — Other Ambulatory Visit: Payer: Self-pay | Admitting: Family Medicine

## 2023-09-22 DIAGNOSIS — Z1231 Encounter for screening mammogram for malignant neoplasm of breast: Secondary | ICD-10-CM

## 2023-09-30 ENCOUNTER — Other Ambulatory Visit: Payer: Self-pay | Admitting: Gastroenterology

## 2023-09-30 ENCOUNTER — Encounter: Payer: Self-pay | Admitting: Family Medicine

## 2023-09-30 ENCOUNTER — Ambulatory Visit: Admitting: Family Medicine

## 2023-09-30 VITALS — BP 124/82 | HR 76 | Temp 98.5°F | Ht 60.0 in | Wt 178.2 lb

## 2023-09-30 DIAGNOSIS — M25551 Pain in right hip: Secondary | ICD-10-CM

## 2023-09-30 DIAGNOSIS — M519 Unspecified thoracic, thoracolumbar and lumbosacral intervertebral disc disorder: Secondary | ICD-10-CM | POA: Diagnosis not present

## 2023-09-30 DIAGNOSIS — N6341 Unspecified lump in right breast, subareolar: Secondary | ICD-10-CM

## 2023-09-30 DIAGNOSIS — G8929 Other chronic pain: Secondary | ICD-10-CM | POA: Diagnosis not present

## 2023-09-30 DIAGNOSIS — M25552 Pain in left hip: Secondary | ICD-10-CM

## 2023-09-30 DIAGNOSIS — N6310 Unspecified lump in the right breast, unspecified quadrant: Secondary | ICD-10-CM | POA: Insufficient documentation

## 2023-09-30 DIAGNOSIS — M5442 Lumbago with sciatica, left side: Secondary | ICD-10-CM

## 2023-09-30 NOTE — Progress Notes (Signed)
 Subjective:    Patient ID: Karina [name], female    DOB: 1955/09/25, 68 y.o.   MRN: [id number]  HPI  Wt Readings from Last 3 Encounters:  09/30/23 178 lb 4 oz (80.9 kg)  07/07/23 182 lb 12.8 oz (82.9 kg)  05/31/23 183 lb (83 kg)   34.81 kg/m  Vitals:   09/30/23 0851  BP: 124/82  Pulse: 76  Temp: 98.5 F (36.9 C)  SpO2: 94%   Pt presents with c/o  Right breast pain  Back and hip pain/ desires ortho ref    This started about a month ago  No trauma  Nipple feels like it is going inward (? Inverted)  Tissue feels more hard than usual  Hurts around the nipple - sore and tender  No rash  No nipple discharge     Normal mammo 07/2022 at the [facility]  History of cyst in past     Had appointment with sport med for back hip but could not make it to appointment  Would rather see ortho  Lower back -worse than usual  Right above tail bone -in the middle  Hip pain (lateral)-pain radiates  Is uncomfortable to lay   Worse to bend backward than forward   Was radiating to right leg- no longer doing that   Cannot tolerate PT due to her MS  [place] is closer    History of lumbar disk dz      Patient Active Problem List   Diagnosis Date Noted   Lump of breast, right 09/30/2023   Bilateral hip pain 09/30/2023   Depressed mood 10/04/2022   Left flank pain 07/31/2022   Petechiae 07/26/2022   Globus sensation 07/26/2022   Occult blood positive stool 05/25/2021   Colon cancer screening 05/07/2021   Estrogen deficiency 05/07/2021   Rheumatoid arthritis (HCC) 03/31/2021   Coronary artery calcification 03/31/2021   Hypokalemia 08/27/2020   Joint pain in both hands 10/12/2019   Head pain 08/22/2019   Elevated serum creatinine 04/24/2019   Frequent urination 04/20/2019   Decreased appetite 04/20/2019   Encounter for screening mammogram for breast cancer 04/20/2019   Seborrheic keratosis, inflamed 12/15/2017   B12 deficiency 08/24/2017   Left low back  pain 08/24/2017   Erosive osteoarthritis of both hands 09/09/2016   Fatigue 09/07/2016   Bilateral hand pain 09/07/2016   Routine general medical examination at a health care facility 05/06/2015   Large breasts 05/06/2015   Lumbar disc disease 11/01/2014   Knee pain, bilateral 03/11/2014   Thoracic back pain 11/30/2013   Encounter for routine gynecological examination 01/31/2013   Encounter for Medicare annual wellness exam 01/23/2013   Chest pain 09/20/2012   Dyspnea 09/13/2012   Asthmatic bronchitis , chronic (HCC) 12/06/2011   Chronic cough 11/22/2011   Anxiety disorder 07/17/2009   HEMORRHOIDS-EXTERNAL 11/07/2008   IRRITABLE BOWEL SYNDROME 11/07/2008   Lung nodule 10/14/2008   Prediabetes 10/14/2008   History of colonic polyps 10/08/2008   Vitamin D  deficiency 07/23/2008   SYNCOPE, HX OF 06/01/2008   FOOT SURGERY, HX OF 06/01/2008   DILATION AND CURETTAGE, HX OF 06/01/2008   OSTEOARTHRITIS, SHOULDER, RIGHT 03/22/2008   ROTATOR CUFF SYNDROME, RIGHT 03/22/2008   ARTHRALGIA 12/12/2007   Allergic rhinitis 08/01/2007   Former smoker 10/17/2006   Peripheral neuropathy 10/17/2006   FOOT PAIN, BILATERAL 10/17/2006   HYPOGLYCEMIA 09/30/2006   HYPERCHOLESTEROLEMIA 09/30/2006   MULTIPLE SCLEROSIS 09/30/2006   GERD 09/30/2006   FIBROCYSTIC BREAST DISEASE 09/30/2006   Past Medical  History:  Diagnosis Date   Allergic rhinitis    Allergy    Anxiety    [place]- [place], no per pt   Cataract    Colon polyps 08/2008   Adenomatous   COPD (chronic obstructive pulmonary disease) (HCC)    Depression    no per pt   Diabetes mellitus    Gastritis 08/2008   H. pylori   GERD (gastroesophageal reflux disease)    Hypercholesteremia    Lung nodule    Menopausal disorder    Multiple sclerosis (HCC)    Neuromuscular disorder (HCC)    Plantar fasciitis    Seizures (HCC)    1 seizure in 2001 due to blood sugar dropping to 38, none since   Shingles    Left V1   Urinary  incontinence    Vertigo    Past Surgical History:  Procedure Laterality Date   CATARACT EXTRACTION Bilateral 2023   CERVICAL DISCECTOMY  03/08/2002   x 2, Anterior; Fusion C4-5, C5-6, C6-7, Dr. Arley Helling   CHEST CT  11/06/2008   With small 4 mm nodule L lung base (rec re check in 1 year)   CHEST CT  07/06/2009   Re check chest CT - lung nodule stable   CHOLECYSTECTOMY     COLONOSCOPY  08/06/2008   Polyps/ re check 5 yrs   CT SINUS LTD W/O CM  02/05/2009   negative   DILATION AND CURETTAGE OF UTERUS     ESOPHAGOGASTRODUODENOSCOPY  08/06/2008   Erosive gastritis, h pylori (treated)   FOOT SURGERY     left plantar fascial problem   ROTATOR CUFF REPAIR     right   TONSILLECTOMY     TUBAL LIGATION     UPPER GASTROINTESTINAL ENDOSCOPY     Social History   Tobacco Use   Smoking status: Former    Current packs/day: 0.00    Average packs/day: 2.0 packs/day for 30.0 years (60.0 ttl pk-yrs)    Types: Cigarettes    Start date: 05/07/1991    Quit date: 05/06/2021    Years since quitting: 2.4   Smokeless tobacco: Never  Vaping Use   Vaping status: Never Used  Substance Use Topics   Alcohol use: No    Alcohol/week: 0.0 standard drinks of alcohol   Drug use: No   Family History  Problem Relation Age of Onset   Migraines Mother    Heart attack Mother    Coronary artery disease Mother    Coronary artery disease Father        6 bypasses    Stroke Father    Diabetes Father    Renal Disease Father    Colon cancer Maternal Grandfather 79   Coronary artery disease Paternal Grandmother    Lung cancer Paternal Aunt    Coronary artery disease Cousin    Multiple sclerosis Neg Hx    Esophageal cancer Neg Hx    Rectal cancer Neg Hx    Stomach cancer Neg Hx    Breast cancer Neg Hx    Allergies  Allergen Reactions   Atorvastatin Other (See Comments)    elevated LFT's   Percocet [Oxycodone-Acetaminophen ] Shortness Of Breath    Felt like going to pass out and sweating   Tecfidera  [Dimethyl Fumarate] Shortness Of Breath, Palpitations, Rash and Cough   Amitriptyline Hcl Itching and Swelling   Betaseron [Interferon Beta-1b] Other (See Comments)    Headache   Duloxetine Other (See Comments)    pain  Pregabalin Other (See Comments)    LE swelling   Zoloft  [Sertraline  Hcl] Other (See Comments)    Headaches    Crestor  [Rosuvastatin ] Other (See Comments)    Muscle aches and cramps to generic crestor  and other statins   Clarithromycin Other (See Comments)    reaction not known   Levofloxacin Rash   Pseudoephedrine Other (See Comments)    legs hurt   Current Outpatient Medications on File Prior to Visit  Medication Sig Dispense Refill   albuterol  (VENTOLIN  HFA) 108 (90 Base) MCG/ACT inhaler INHALE 2 PUFFS UP TO EVERY 4 HOURS AS NEEDED FOR WHEEZING 3 each 3   ALPRAZolam  (XANAX ) 0.5 MG tablet Take 1 tablet (0.5 mg total) by mouth 2 (two) times daily as needed. For anxiety. 30 tablet 0   aspirin  EC 81 MG tablet Take 1 tablet (81 mg total) by mouth daily. Swallow whole. 90 tablet 3   budesonide -formoterol  (SYMBICORT ) 160-4.5 MCG/ACT inhaler Inhale 2 puffs into the lungs 2 (two) times daily. (Patient taking differently: Inhale 2 puffs into the lungs 2 (two) times daily as needed.) 3 each 1   cholecalciferol (VITAMIN D3) 25 MCG (1000 UNIT) tablet Take 1,000 Units by mouth daily.     famotidine  (PEPCID ) 20 MG tablet Take 1 tablet (20 mg total) by mouth daily. Take before dinner 180 tablet 0   fluticasone  (FLONASE ) 50 MCG/ACT nasal spray Place 2 sprays into both nostrils daily. (Patient taking differently: Place 2 sprays into both nostrils daily as needed.) 16 g 11   gabapentin  (NEURONTIN ) 300 MG capsule TAKE 2 CAPSULES BY MOUTH AT BEDTIME. 180 capsule 1   ipratropium-albuterol  (DUONEB) 0.5-2.5 (3) MG/3ML SOLN TAKE 3 MLS BY NEBULIZATION EVERY 6 (SIX) HOURS AS NEEDED. 360 mL 0   methylcellulose (CITRUCEL) oral powder 1 tsp daily     Multiple Vitamin (MULTIVITAMIN) tablet Take 1  tablet by mouth daily.     nicotine (NICODERM CQ - DOSED IN MG/24 HOURS) 21 mg/24hr patch Place 21 mg onto the skin daily.     pantoprazole  (PROTONIX ) 40 MG tablet Take 1 tablet (40 mg total) by mouth daily before breakfast.     rosuvastatin  (CRESTOR ) 10 MG tablet Take 1 tablet (10 mg total) by mouth daily. 90 tablet 3   Spacer/Aero-Holding Chambers (AEROCHAMBER MV) inhaler Use as instructed 1 each 0   No current facility-administered medications on file prior to visit.    Review of Systems  Constitutional:  Positive for fatigue. Negative for activity change, appetite change, fever and unexpected weight change.  HENT:  Negative for congestion, ear pain, rhinorrhea, sinus pressure and sore throat.   Eyes:  Negative for pain, redness and visual disturbance.  Respiratory:  Negative for cough, shortness of breath and wheezing.   Cardiovascular:  Negative for chest pain and palpitations.  Gastrointestinal:  Negative for abdominal pain, blood in stool, constipation and diarrhea.  Endocrine: Negative for polydipsia and polyuria.  Genitourinary:  Negative for dysuria, frequency and urgency.       Breast lump and tenderness   Musculoskeletal:  Positive for arthralgias and back pain. Negative for myalgias.  Skin:  Negative for pallor and rash.  Allergic/Immunologic: Negative for environmental allergies.  Neurological:  Negative for dizziness, syncope and headaches.  Hematological:  Negative for adenopathy. Does not bruise/bleed easily.  Psychiatric/Behavioral:  Negative for decreased concentration.        Objective:   Physical Exam Constitutional:      General: She is not in acute distress.  Appearance: Normal appearance. She is well-developed. She is obese. She is not ill-appearing or diaphoretic.  HENT:     Head: Normocephalic and atraumatic.  Eyes:     Conjunctiva/sclera: Conjunctivae normal.     Pupils: Pupils are equal, round, and reactive to light.  Neck:     Thyroid : No  thyromegaly.     Vascular: No carotid bruit or JVD.  Cardiovascular:     Rate and Rhythm: Normal rate and regular rhythm.     Heart sounds: Normal heart sounds.     No gallop.  Pulmonary:     Effort: Pulmonary effort is normal. No respiratory distress.     Breath sounds: Normal breath sounds. No wheezing or rales.  Abdominal:     General: There is no distension or abdominal bruit.     Palpations: Abdomen is soft.  Genitourinary:    Comments: Right breast  Breast exam: No bulging, retraction, inflamation, nipple discharge or skin changes noted.  No axillary or clavicular LA.    Nipple is retracted and there is a firm mass felt inferiorly under nipple that is tender   Left breast  Breast exam: No mass, nodules, thickening, tenderness, bulging, retraction, inflamation, nipple discharge or skin changes noted.  No axillary or clavicular LA.     Musculoskeletal:     Cervical back: Normal range of motion and neck supple.     Lumbar back: Spasms, tenderness and bony tenderness present. No swelling or deformity. Decreased range of motion. Negative right straight leg raise test and negative left straight leg raise test. No scoliosis.     Right lower leg: No edema.     Left lower leg: No edema.     Comments: Some lower LS tenderness Tender in left lumbar musculature and SI joint also  Flex - 30-40 deg, ext 10 deg with pain   Tender in bilateral greater trochanter areas  Limited rom of hips   Lymphadenopathy:     Cervical: No cervical adenopathy.  Skin:    General: Skin is warm and dry.     Coloration: Skin is not pale.     Findings: No rash.  Neurological:     Mental Status: She is alert.     Cranial Nerves: No cranial nerve deficit.     Coordination: Coordination normal.     Deep Tendon Reflexes: Reflexes are normal and symmetric. Reflexes normal.  Psychiatric:        Mood and Affect: Mood normal.           Assessment & Plan:   Problem List Items Addressed This Visit        Musculoskeletal and Integument   Lumbar disc disease   Worse lumbar pain- low  Was rad to RLE-not not No neuro symptoms but pt also has MS so hard to tell with baseline Worse to extend Worse to flex past 30 deg   Ref to orthopedics as requested Reviewed last MRI with disk bulges  Pt does not tolerate PT with MS in past       Relevant Orders   Ambulatory referral to Orthopedic Surgery     Other   Lump of breast, right - Primary   Breast pain under nipple inferiorly with firmness /lump  Tender  Nipple is newly inverted   Ordered imaging -us  and mammo      Relevant Orders   MM 3D DIAGNOSTIC MAMMOGRAM BILATERAL BREAST   US  BREAST COMPLETE UNI LEFT INC AXILLA   US  BREAST COMPLETE UNI  RIGHT INC AXILLA   Left low back pain   Relevant Orders   Ambulatory referral to Orthopedic Surgery   Bilateral hip pain   Consistent with trochanteric bursitis  Ref to ortho as requested Encouraged use of cold compresses       Relevant Orders   Ambulatory referral to Orthopedic Surgery

## 2023-09-30 NOTE — Assessment & Plan Note (Signed)
 Consistent with trochanteric bursitis  Ref to ortho as requested Encouraged use of cold compresses

## 2023-09-30 NOTE — Assessment & Plan Note (Signed)
 Breast pain under nipple inferiorly with firmness /lump  Tender  Nipple is newly inverted   Ordered imaging -us  and mammo

## 2023-09-30 NOTE — Assessment & Plan Note (Signed)
 Worse lumbar pain- low  Was rad to RLE-not not No neuro symptoms but pt also has MS so hard to tell with baseline Worse to extend Worse to flex past 30 deg   Ref to orthopedics as requested Reviewed last MRI with disk bulges  Pt does not tolerate PT with MS in past

## 2023-09-30 NOTE — Patient Instructions (Signed)
 I put the referral in for orthopedics  Please let us  know if you don't hear in 1-2 weeks to set that up (you may get call or my chart message)    You have an order for:  [x]   3D Mammogram diagnostic with ultrasound     Please call for appointment:   []   [facility]  [address] [place] [address]  [phone]  []   [facility])   [address]  [place], [place] [address]  [phone]  [x]   [facility]      [address] [place], [place]        [phone]         []   [facility]  [address] [place], [place]  [phone]  []  [facility]   [address]   [place], [place] [address]  [phone]  []  [facility]  [address] [place], [place] [address] [phone]    Make sure to wear two piece clothing  No lotions powders or deodorants the day of the appointment Make sure to bring picture ID and insurance card.  Bring list of medications you are currently taking including any supplements.   Schedule your screening mammogram through MyChart!   Select [place] imaging sites can now be scheduled through MyChart.  Log into your MyChart account.  Go to 'Visit' (or 'Appointments' if  on mobile App) --> Schedule an  Appointment  Under 'Select a Reason for Visit' choose the Mammogram  Screening option.  Complete the pre-visit questions  and select the time and place that  best fits your schedule

## 2023-10-03 ENCOUNTER — Ambulatory Visit
Admission: RE | Admit: 2023-10-03 | Discharge: 2023-10-03 | Disposition: A | Discharge status: Home / Self Care | Source: Ambulatory Visit | Attending: Family Medicine | Admitting: Family Medicine

## 2023-10-03 ENCOUNTER — Encounter: Payer: Self-pay | Admitting: Family Medicine

## 2023-10-03 ENCOUNTER — Other Ambulatory Visit: Payer: Self-pay | Admitting: Family Medicine

## 2023-10-03 ENCOUNTER — Ambulatory Visit: Payer: Self-pay | Admitting: Family Medicine

## 2023-10-03 DIAGNOSIS — R92323 Mammographic fibroglandular density, bilateral breasts: Secondary | ICD-10-CM | POA: Diagnosis not present

## 2023-10-03 DIAGNOSIS — N6341 Unspecified lump in right breast, subareolar: Secondary | ICD-10-CM

## 2023-10-03 DIAGNOSIS — N6459 Other signs and symptoms in breast: Secondary | ICD-10-CM | POA: Diagnosis not present

## 2023-10-03 DIAGNOSIS — R92343 Mammographic extreme density, bilateral breasts: Secondary | ICD-10-CM | POA: Diagnosis not present

## 2023-10-05 ENCOUNTER — Other Ambulatory Visit

## 2023-10-07 ENCOUNTER — Ambulatory Visit
Admission: RE | Admit: 2023-10-07 | Discharge: 2023-10-07 | Disposition: A | Discharge status: Home / Self Care | Source: Ambulatory Visit | Attending: Family Medicine | Admitting: Family Medicine

## 2023-10-07 DIAGNOSIS — N6341 Unspecified lump in right breast, subareolar: Secondary | ICD-10-CM

## 2023-10-07 HISTORY — PX: BREAST BIOPSY: SHX20

## 2023-10-11 LAB — SURGICAL PATHOLOGY

## 2023-10-16 ENCOUNTER — Ambulatory Visit: Payer: Self-pay | Admitting: Family Medicine

## 2023-10-17 ENCOUNTER — Encounter: Payer: Self-pay | Admitting: *Deleted

## 2023-10-17 DIAGNOSIS — C50511 Malignant neoplasm of lower-outer quadrant of right female breast: Secondary | ICD-10-CM | POA: Insufficient documentation

## 2023-10-17 DIAGNOSIS — Z170 Estrogen receptor positive status [ER+]: Secondary | ICD-10-CM | POA: Insufficient documentation

## 2023-10-19 ENCOUNTER — Encounter: Payer: Self-pay | Admitting: Hematology

## 2023-10-19 ENCOUNTER — Other Ambulatory Visit: Payer: Self-pay | Admitting: *Deleted

## 2023-10-19 ENCOUNTER — Inpatient Hospital Stay: Attending: Hematology

## 2023-10-19 ENCOUNTER — Encounter: Payer: Self-pay | Admitting: *Deleted

## 2023-10-19 ENCOUNTER — Inpatient Hospital Stay (HOSPITAL_BASED_OUTPATIENT_CLINIC_OR_DEPARTMENT_OTHER): Admitting: Hematology

## 2023-10-19 ENCOUNTER — Telehealth: Payer: Self-pay | Admitting: Genetic Counselor

## 2023-10-19 ENCOUNTER — Ambulatory Visit
Admission: RE | Admit: 2023-10-19 | Discharge: 2023-10-19 | Disposition: A | Discharge status: Home / Self Care | Source: Ambulatory Visit | Attending: Radiation Oncology | Admitting: Radiation Oncology

## 2023-10-19 ENCOUNTER — Ambulatory Visit: Payer: Self-pay | Admitting: General Surgery

## 2023-10-19 ENCOUNTER — Encounter: Payer: Self-pay | Admitting: Genetic Counselor

## 2023-10-19 VITALS — BP 127/87 | HR 72 | Temp 97.8°F | Resp 18 | Ht 60.0 in | Wt 180.8 lb

## 2023-10-19 DIAGNOSIS — C50511 Malignant neoplasm of lower-outer quadrant of right female breast: Secondary | ICD-10-CM

## 2023-10-19 DIAGNOSIS — Z1732 Human epidermal growth factor receptor 2 negative status: Secondary | ICD-10-CM

## 2023-10-19 DIAGNOSIS — Z1721 Progesterone receptor positive status: Secondary | ICD-10-CM | POA: Insufficient documentation

## 2023-10-19 DIAGNOSIS — F17210 Nicotine dependence, cigarettes, uncomplicated: Secondary | ICD-10-CM

## 2023-10-19 DIAGNOSIS — M8580 Other specified disorders of bone density and structure, unspecified site: Secondary | ICD-10-CM | POA: Diagnosis not present

## 2023-10-19 DIAGNOSIS — Z170 Estrogen receptor positive status [ER+]: Secondary | ICD-10-CM | POA: Insufficient documentation

## 2023-10-19 DIAGNOSIS — C50111 Malignant neoplasm of central portion of right female breast: Secondary | ICD-10-CM

## 2023-10-19 LAB — CBC WITH DIFFERENTIAL (CANCER CENTER ONLY)
Abs Immature Granulocytes: 0.02 K/uL (ref 0.00–0.07)
Basophils Absolute: 0.1 K/uL (ref 0.0–0.1)
Basophils Relative: 1 %
Eosinophils Absolute: 0.2 K/uL (ref 0.0–0.5)
Eosinophils Relative: 3 %
HCT: 43.7 % (ref 36.0–46.0)
Hemoglobin: 14.3 g/dL (ref 12.0–15.0)
Immature Granulocytes: 0 %
Lymphocytes Relative: 36 %
Lymphs Abs: 2.6 K/uL (ref 0.7–4.0)
MCH: 30.6 pg (ref 26.0–34.0)
MCHC: 32.7 g/dL (ref 30.0–36.0)
MCV: 93.6 fL (ref 80.0–100.0)
Monocytes Absolute: 0.6 K/uL (ref 0.1–1.0)
Monocytes Relative: 8 %
Neutro Abs: 3.8 K/uL (ref 1.7–7.7)
Neutrophils Relative %: 52 %
Platelet Count: 242 K/uL (ref 150–400)
RBC: 4.67 MIL/uL (ref 3.87–5.11)
RDW: 13.6 % (ref 11.5–15.5)
WBC Count: 7.3 K/uL (ref 4.0–10.5)
nRBC: 0 % (ref 0.0–0.2)

## 2023-10-19 LAB — CMP (CANCER CENTER ONLY)
ALT: 12 U/L (ref 0–44)
AST: 16 U/L (ref 15–41)
Albumin: 4.3 g/dL (ref 3.5–5.0)
Alkaline Phosphatase: 77 U/L (ref 38–126)
Anion gap: 4 — ABNORMAL LOW (ref 5–15)
BUN: 10 mg/dL (ref 8–23)
CO2: 33 mmol/L — ABNORMAL HIGH (ref 22–32)
Calcium: 9.7 mg/dL (ref 8.9–10.3)
Chloride: 101 mmol/L (ref 98–111)
Creatinine: 0.98 mg/dL (ref 0.44–1.00)
GFR, Estimated: >60 mL/min (ref >60)
Glucose, Bld: 89 mg/dL (ref 70–99)
Potassium: 4.7 mmol/L (ref 3.5–5.1)
Sodium: 138 mmol/L (ref 135–145)
Total Bilirubin: 0.4 mg/dL (ref 0.0–1.2)
Total Protein: 7.3 g/dL (ref 6.5–8.1)

## 2023-10-19 LAB — GENETIC SCREENING ORDER

## 2023-10-19 NOTE — Telephone Encounter (Signed)
 Karina [name] was seen by a genetic counselor during the breast multidisciplinary clinic on October 19, 2023. In addition to her personal history of breast cancer, she reported a personal history of breast cancer and a family history of colon cancer in her maternal grandfather, lung cancer in a paternal aunt and lung disease in her sister and mother. She does not meet NCCN criteria for genetic testing at this time.   She was still offered genetic counseling and testing but declined. We encourage her to contact us  if there are any changes to her personal or family history of cancer. If she meets NCCN criteria based on the updated personal/family history, she would be recommended to have genetic counseling and testing.   Karina Monte, MS, CGC  Licensed, Patent attorney [email or web address] phone: [phone]

## 2023-10-19 NOTE — Progress Notes (Signed)
 [facility]   [phone] [phone]   Clinic New Consult Note   Patient Care Team: [name], Laine LABOR, MD as PCP - General Jenel, Carlin POUR, MD (Inactive) as Consulting Physician (Neurology) Perla Evalene PARAS, MD as Consulting Physician (Cardiology) Roz Anes, MD as Consulting Physician (Ophthalmology) Fate Morna SAILOR, [facility] (Inactive) as Pharmacist (Pharmacist) Tyree Nanetta SAILOR, RN as Oncology Nurse Navigator Gerome, Devere HERO, RN as Oncology Nurse Navigator Curvin Deward MOULD, MD as Consulting Physician (General Surgery) Lanny Callander, MD as Consulting Physician (Hematology) Shannon Agent, MD as Consulting Physician (Radiation Oncology) 10/19/2023  CHIEF COMPLAINTS/PURPOSE OF CONSULTATION:  Newly diagnosed right breast cancer  REFERRING PHYSICIAN: Breast center    Discussed the use of AI scribe software for clinical note transcription with the patient, who gave verbal consent to proceed.  History of Present Illness Karina [name] is a 68 year old female who presents to our multidisciplinary breast clinic today for consultations of newly diagnosed right breast cancer.   She notices a right nipple inversion and experiences some nipple tenderness and pain in her right breast for two months. A mammogram in July 2025 revealed a 1.3 x 0.7 x 0.5 cm mass behind the nipple, biopsy showed invasive lobular carcinoma, ER 100% positive, PR 100% positive, HER2 negative, with Ki-67 1%.    Her medical history includes multiple sclerosis with hand tremors and fatigue, COPD managed with an inhaler and Symbicort , and ulcerative colitis with ongoing stomach issues. She has diabetes with episodes of hypoglycemia. Surgical history includes gallbladder removal, right shoulder replacement, and foot surgery for plantar fasciitis.  Family history is significant for colon cancer in her grandfather. She is retired, married, and has three children. She is on Social Security  disability benefits, does not consume alcohol or use recreational drugs, and has a history of smoking. She reports joint pain in her hands but denies headaches or chest pain.     MEDICAL HISTORY:  Past Medical History:  Diagnosis Date   Allergic rhinitis    Allergy    Anxiety    Councelor- Slater Darner, no per pt   Cataract    Colon polyps 08/2008   Adenomatous   COPD (chronic obstructive pulmonary disease) (HCC)    Depression    no per pt   Diabetes mellitus    Gastritis 08/2008   H. pylori   GERD (gastroesophageal reflux disease)    Hypercholesteremia    Hypercholesteremia    Lung nodule    Menopausal disorder    Multiple sclerosis (HCC)    Neuromuscular disorder (HCC)    Neuropathy    Plantar fasciitis    Seizures (HCC)    1 seizure in 2001 due to blood sugar dropping to 38, none since   Shingles    Left V1   Ulcerative colitis (HCC)    Urinary incontinence    Vertigo     SURGICAL HISTORY: Past Surgical History:  Procedure Laterality Date   BREAST BIOPSY Right 10/07/2023   US  RT BREAST BX W LOC DEV 1ST LESION IMG BX SPEC US  GUIDE 10/07/2023 [facility] MAMMOGRAPHY   CATARACT EXTRACTION Bilateral 2023   CERVICAL DISCECTOMY  03/08/2002   x 2, Anterior; Fusion C4-5, C5-6, C6-7, Dr. Arley Helling   CHEST CT  11/06/2008   With small 4 mm nodule L lung base (rec re check in 1 year)   CHEST CT  07/06/2009   Re check chest CT - lung nodule stable   CHOLECYSTECTOMY  COLONOSCOPY  08/06/2008   Polyps/ re check 5 yrs   CT SINUS LTD W/O CM  02/05/2009   negative   DILATION AND CURETTAGE OF UTERUS     ESOPHAGOGASTRODUODENOSCOPY  08/06/2008   Erosive gastritis, h pylori (treated)   FOOT SURGERY     left plantar fascial problem   ROTATOR CUFF REPAIR     right   TONSILLECTOMY     TUBAL LIGATION     UPPER GASTROINTESTINAL ENDOSCOPY      SOCIAL HISTORY: Social History   Socioeconomic History   Marital status: Married    Spouse name: Not on file   Number of children: 3    Years of education: 9 th   Highest education level: Not on file  Occupational History   Occupation: Disabled     Employer: DISABLED  Tobacco Use   Smoking status: Former    Current packs/day: 0.00    Average packs/day: 2.0 packs/day for 30.0 years (60.0 ttl pk-yrs)    Types: Cigarettes    Start date: 05/07/1991    Quit date: 05/06/2021    Years since quitting: 2.4   Smokeless tobacco: Never  Vaping Use   Vaping status: Never Used  Substance and Sexual Activity   Alcohol use: No    Alcohol/week: 0.0 standard drinks of alcohol   Drug use: No   Sexual activity: Not on file  Other Topics Concern   Not on file  Social History Narrative   Married with 3 children   Disabled.   Education 9th grade    Caffeine one cup of coffee daily.   Patient is right handed.    Social Drivers of Corporate investment banker Strain: Low Risk  (09/30/2022)   Overall Financial Resource Strain (CARDIA)    Difficulty of Paying Living Expenses: Not hard at all  Food Insecurity: No Food Insecurity (09/30/2022)   Hunger Vital Sign    Worried About Running Out of Food in the Last Year: Never true    Ran Out of Food in the Last Year: Never true  Transportation Needs: No Transportation Needs (09/30/2022)   PRAPARE - Administrator, Civil Service (Medical): No    Lack of Transportation (Non-Medical): No  Physical Activity: Inactive (09/30/2022)   Exercise Vital Sign    Days of Exercise per Week: 0 days    Minutes of Exercise per Session: 0 min  Stress: Stress Concern Present (09/30/2022)   [employer] of Occupational Health - Occupational Stress Questionnaire    Feeling of Stress : Very much  Social Connections: Moderately Isolated (09/30/2022)   Social Connection and Isolation Panel    Frequency of Communication with Friends and Family: Never    Frequency of Social Gatherings with Friends and Family: Never    Attends Religious Services: More than 4 times per year    Active Member of  [employer] or Organizations: No    Attends Banker Meetings: Never    Marital Status: Married  Catering manager Violence: Not At Risk (09/30/2022)   Humiliation, Afraid, Rape, and Kick questionnaire    Fear of Current or Ex-Partner: No    Emotionally Abused: No    Physically Abused: No    Sexually Abused: No    FAMILY HISTORY: Family History  Problem Relation Age of Onset   Migraines Mother    Heart attack Mother    Coronary artery disease Mother    Coronary artery disease Father  6 bypasses    Stroke Father    Diabetes Father    Renal Disease Father    Colon cancer Maternal Grandfather 5   Coronary artery disease Paternal Grandmother    Lung cancer Paternal Aunt    Coronary artery disease Cousin    Multiple sclerosis Neg Hx    Esophageal cancer Neg Hx    Rectal cancer Neg Hx    Stomach cancer Neg Hx    Breast cancer Neg Hx     ALLERGIES:  is allergic to atorvastatin, percocet [oxycodone-acetaminophen ], tecfidera [dimethyl fumarate], amitriptyline hcl, betaseron [interferon beta-1b], duloxetine, pregabalin, zoloft  [sertraline  hcl], crestor  [rosuvastatin ], clarithromycin, levofloxacin, and pseudoephedrine.  MEDICATIONS:  Current Outpatient Medications  Medication Sig Dispense Refill   albuterol  (VENTOLIN  HFA) 108 (90 Base) MCG/ACT inhaler INHALE 2 PUFFS UP TO EVERY 4 HOURS AS NEEDED FOR WHEEZING 3 each 3   ALPRAZolam  (XANAX ) 0.5 MG tablet Take 1 tablet (0.5 mg total) by mouth 2 (two) times daily as needed. For anxiety. 30 tablet 0   aspirin  EC 81 MG tablet Take 1 tablet (81 mg total) by mouth daily. Swallow whole. 90 tablet 3   budesonide -formoterol  (SYMBICORT ) 160-4.5 MCG/ACT inhaler Inhale 2 puffs into the lungs 2 (two) times daily. (Patient taking differently: Inhale 2 puffs into the lungs 2 (two) times daily as needed.) 3 each 1   cholecalciferol (VITAMIN D3) 25 MCG (1000 UNIT) tablet Take 1,000 Units by mouth daily.     famotidine  (PEPCID ) 20 MG tablet  TAKE 1 TABLET (20 MG TOTAL) BY MOUTH DAILY. TAKE BEFORE DINNER 90 tablet 2   fluticasone  (FLONASE ) 50 MCG/ACT nasal spray Place 2 sprays into both nostrils daily. (Patient taking differently: Place 2 sprays into both nostrils daily as needed.) 16 g 11   gabapentin  (NEURONTIN ) 300 MG capsule TAKE 2 CAPSULES BY MOUTH AT BEDTIME. 180 capsule 1   ipratropium-albuterol  (DUONEB) 0.5-2.5 (3) MG/3ML SOLN TAKE 3 MLS BY NEBULIZATION EVERY 6 (SIX) HOURS AS NEEDED. 360 mL 0   methylcellulose (CITRUCEL) oral powder 1 tsp daily     Multiple Vitamin (MULTIVITAMIN) tablet Take 1 tablet by mouth daily.     nicotine (NICODERM CQ - DOSED IN MG/24 HOURS) 21 mg/24hr patch Place 21 mg onto the skin daily.     pantoprazole  (PROTONIX ) 40 MG tablet Take 1 tablet (40 mg total) by mouth daily before breakfast.     rosuvastatin  (CRESTOR ) 10 MG tablet Take 1 tablet (10 mg total) by mouth daily. 90 tablet 3   Spacer/Aero-Holding Chambers (AEROCHAMBER MV) inhaler Use as instructed 1 each 0   No current facility-administered medications for this visit.    REVIEW OF SYSTEMS:   Constitutional: Denies fevers, chills or abnormal night sweats Eyes: Denies blurriness of vision, double vision or watery eyes Ears, nose, mouth, throat, and face: Denies mucositis or sore throat Respiratory: Denies cough, dyspnea or wheezes Cardiovascular: Denies palpitation, chest discomfort or lower extremity swelling Gastrointestinal:  Denies nausea, heartburn or change in bowel habits Skin: Denies abnormal skin rashes Lymphatics: Denies new lymphadenopathy or easy bruising Neurological:Denies numbness, tingling or new weaknesses Behavioral/Psych: Mood is stable, no new changes  All other systems were reviewed with the patient and are negative.  PHYSICAL EXAMINATION: ECOG PERFORMANCE STATUS: 1 - Symptomatic but completely ambulatory  Vitals:   10/19/23 1321  BP: 127/87  Pulse: 72  Resp: 18  Temp: 97.8 F (36.6 C)  SpO2: 98%   Filed  Weights   10/19/23 1321  Weight: 180 lb 12.8 oz (82  kg)    GENERAL:alert, no distress and comfortable SKIN: skin color, texture, turgor are normal, no rashes or significant lesions EYES: normal, conjunctiva are pink and non-injected, sclera clear OROPHARYNX:no exudate, no erythema and lips, buccal mucosa, and tongue normal  NECK: supple, thyroid  normal size, non-tender, without nodularity LYMPH:  no palpable lymphadenopathy in the cervical, axillary or inguinal LUNGS: clear to auscultation and percussion with normal breathing effort HEART: regular rate & rhythm and no murmurs and no lower extremity edema ABDOMEN:abdomen soft, non-tender and normal bowel sounds Musculoskeletal:no cyanosis of digits and no clubbing  PSYCH: alert & oriented x 3 with fluent speech NEURO: no focal motor/sensory deficits BREAST: A 2 cm lump behind right nipple is palpable, right nipple inverted, small ulcer on right breast with surrounding skin erythema, per patient is from biopsy.  Exam of the left breast and axilla were negative for palpable masses. Physical Exam   LABORATORY DATA:  I have reviewed the data as listed    Latest Ref Rng & Units 10/19/2023   12:30 PM 07/07/2023   11:46 AM 02/22/2023    8:29 AM  CBC  WBC 4.0 - 10.5 K/uL 7.3  7.2  7.2   Hemoglobin 12.0 - 15.0 g/dL 85.6  84.7  85.6   Hematocrit 36.0 - 46.0 % 43.7  46.8  43.9   Platelets 150 - 400 K/uL 242  235  241.0     @cmpl @  RADIOGRAPHIC STUDIES: I have personally reviewed the radiological images as listed and agreed with the findings in the report. US  RT BREAST BX W LOC DEV 1ST LESION IMG BX SPEC US  GUIDE Addendum Date: 10/14/2023 ADDENDUM REPORT: 10/14/2023 08:39 ADDENDUM: PATHOLOGY revealed: 1. Breast, right, needle core biopsy, retroareolar mass - INVASIVE LOBULAR CARCINOMA - OVERALL GRADE: 1. - LYMPHOVASCULAR INVASION: NOT IDENTIFIED - INVASIVE CANCER LENGTH: 0.8 CM (8 MM). - CALCIFICATION: NOT IDENTIFIED. Pathology results are  CONCORDANT with imaging findings, per Rosina Gelineau M.D. Pathology results and recommendations were discussed with patient via telephone on 10/11/2023 by Hendricks Benders RN. Patient reported biopsy site doing well with no adverse symptoms, and slight tenderness and bruising at the site. Post biopsy care instructions were reviewed, questions were answered and my direct phone number was provided. Patient was instructed to call [facility] for any additional questions or concerns related to biopsy site. RECOMMENDATIONS: 1. Surgical and oncological consultation. Patient was referred to the [facility] at [facility] with appointment on 10/19/2023. Pathology results reported by Rock Hover RN on 10/11/2023. Electronically Signed   By: Rosina Gelineau M.D.   On: 10/14/2023 08:39   Result Date: 10/14/2023 CLINICAL DATA:  68 year old female with right nipple inversion and palpable lump behind the right nipple measuring 1 x 1.7 cm presents today for ultrasound-guided biopsy of right retroareolar mass. EXAM: ULTRASOUND GUIDED RIGHT BREAST CORE NEEDLE BIOPSY COMPARISON:  Previous exam(s). PROCEDURE: I met with the patient and we discussed the procedure of ultrasound-guided biopsy, including benefits and alternatives. We discussed the high likelihood of a successful procedure. We discussed the risks of the procedure, including infection, bleeding, tissue injury, clip migration, and inadequate sampling. Informed written consent was given. The usual time-out protocol was performed immediately prior to the procedure. Lesion quadrant: Right retroareolar Using sterile technique and 1% Lidocaine with and without epinephrine as local anesthetic, under direct ultrasound visualization, a 14 gauge spring-loaded device was used to perform biopsy of an irregular mixed echogenicity 1 x 1.7 cm  palpable mass behind the right nipple using an inferior approach. At the  conclusion of the procedure ribbon shaped tissue marker clip was deployed into the biopsy cavity. Follow up 2 view mammogram was performed and dictated separately. IMPRESSION: Ultrasound guided biopsy of right retroareolar mass. No apparent complications. Electronically Signed: By: Rosina Gelineau M.D. On: 10/07/2023 12:32   MM CLIP PLACEMENT RIGHT Result Date: 10/07/2023 CLINICAL DATA:  68 year old female with palpable retroareolar mass in right nipple inversion status post ultrasound-guided biopsy EXAM: 3D DIAGNOSTIC RIGHT MAMMOGRAM POST ULTRASOUND BIOPSY COMPARISON:  Previous exam(s). ACR Breast Density Category a: The breasts are almost entirely fatty. FINDINGS: 3D Mammographic images were obtained following ultrasound guided biopsy of a 1.7 cm right retroareolar mass. The biopsy marking clip is in expected position at the site of biopsy. IMPRESSION: Appropriate positioning of the ribbon shaped biopsy marking clip at the site of biopsy in the right retroareolar breast. Final Assessment: Post Procedure Mammograms for Marker Placement Electronically Signed   By: Rosina Gelineau M.D.   On: 10/07/2023 12:39   MM 3D DIAGNOSTIC MAMMOGRAM BILATERAL BREAST Result Date: 10/03/2023 CLINICAL DATA:  Patient presents for bilateral diagnostic examination due to right nipple inversion which feels worse at times. Patient also describes tenderness and firmness to the right retroareolar region. Patient notes tenderness in the right axilla. EXAM: DIGITAL DIAGNOSTIC BILATERAL MAMMOGRAM WITH TOMOSYNTHESIS AND CAD; ULTRASOUND RIGHT BREAST LIMITED TECHNIQUE: Bilateral digital diagnostic mammography and breast tomosynthesis was performed. The images were evaluated with computer-aided detection. ; Targeted ultrasound examination of the right breast was performed COMPARISON:  Previous exam(s). ACR Breast Density Category a: The breasts are almost entirely fatty. FINDINGS: Exam demonstrates focal masslike irregular density over the  right retroareolar region abutting the nipple. Remainder of the right breast is unchanged. Left breast is unchanged. Targeted ultrasound is performed, showing a focal masslike irregular hypoechoic area with shadowing over the right retroareolar region best seen from the 6 o'clock position. This measures approximately 0.5 x 0.7 x 1.3 cm and corresponds to the mammographic finding. Ultrasound of the right axilla is unremarkable. IMPRESSION: Irregular 1.3 cm masslike area with shadowing over the right retroareolar region best seen from the 6 o'clock position sonographically. No abnormal right axillary lymph nodes. RECOMMENDATION: Recommend ultrasound-guided core needle biopsy for further evaluation. I have discussed the findings and recommendations with the patient. If applicable, a reminder letter will be sent to the patient regarding the next appointment. BI-RADS CATEGORY  4: Suspicious. Biopsy scheduling will be facilitated by the sonographer prior to patient's departure from the Breast Center. Electronically Signed   By: Toribio Agreste M.D.   On: 10/03/2023 14:02   US  LIMITED ULTRASOUND INCLUDING AXILLA RIGHT BREAST Result Date: 10/03/2023 CLINICAL DATA:  Patient presents for bilateral diagnostic examination due to right nipple inversion which feels worse at times. Patient also describes tenderness and firmness to the right retroareolar region. Patient notes tenderness in the right axilla. EXAM: DIGITAL DIAGNOSTIC BILATERAL MAMMOGRAM WITH TOMOSYNTHESIS AND CAD; ULTRASOUND RIGHT BREAST LIMITED TECHNIQUE: Bilateral digital diagnostic mammography and breast tomosynthesis was performed. The images were evaluated with computer-aided detection. ; Targeted ultrasound examination of the right breast was performed COMPARISON:  Previous exam(s). ACR Breast Density Category a: The breasts are almost entirely fatty. FINDINGS: Exam demonstrates focal masslike irregular density over the right retroareolar region abutting the  nipple. Remainder of the right breast is unchanged. Left breast is unchanged. Targeted ultrasound is performed, showing a focal masslike irregular hypoechoic area with shadowing over the right  retroareolar region best seen from the 6 o'clock position. This measures approximately 0.5 x 0.7 x 1.3 cm and corresponds to the mammographic finding. Ultrasound of the right axilla is unremarkable. IMPRESSION: Irregular 1.3 cm masslike area with shadowing over the right retroareolar region best seen from the 6 o'clock position sonographically. No abnormal right axillary lymph nodes. RECOMMENDATION: Recommend ultrasound-guided core needle biopsy for further evaluation. I have discussed the findings and recommendations with the patient. If applicable, a reminder letter will be sent to the patient regarding the next appointment. BI-RADS CATEGORY  4: Suspicious. Biopsy scheduling will be facilitated by the sonographer prior to patient's departure from the Breast Center. Electronically Signed   By: Toribio Agreste M.D.   On: 10/03/2023 14:02    Assessment & Plan Malignant neoplasm of lower outer quadrant of right breast, invasive lobular carcinoma of right breast, ER/PR positive, HER2 negative, cT1cN0M0, stage IA Invasive lobular carcinoma of the right breast, measuring 1.3 cm, ER/PR positive, HER2 negative, stage 1, located behind the nipple. Lobular carcinoma may appear smaller on imaging, necessitating an MRI for accurate assessment and complete surgical removal. Lymph nodes appear normal on mammogram, but potential tumor cell presence requires sampling during surgery. An Oncotype DX test post-surgery will determine chemotherapy necessity based on recurrence score. - Order breast MRI to assess tumor size and extent due to the lobular histology - Schedule surgery for partial mastectomy, including nipple removal and lymph node sampling. - Perform Oncotype DX test on tumor tissue post-surgery to assess recurrence risk and  need for chemotherapy. - If Oncotype DX score is 26 or higher, initiate chemotherapy. - If Oncotype DX score is less than 25, proceed with radiation therapy followed by hormone therapy.  Post-biopsy wound infection of right breast Post-biopsy wound infection at the site of the right breast biopsy, with red and sticky skin indicating possible infection. - Prescribe antibiotics for wound infection.  Osteopenia (suspected, needs confirmation) Osteopenia is suspected based on past bone density results, but confirmation is needed due to lack of recent bone density scan. - Order bone density scan to confirm osteopenia.   Plan - Imaging and biopsy results discussed with patient in detail -Bilateral breast MRI with and without contrast - Will proceed with right lumpectomy and sentinel lymph node biopsy - Oncotype on her surgical sample - I will see her at the end of her radiation, or sooner if needed.  No orders of the defined types were placed in this encounter.   All questions were answered. The patient knows to call the clinic with any problems, questions or concerns. I spent 35 minutes counseling the patient face to face. The total time spent in the appointment was 45 minutes including review of chart and various tests results, discussions about plan of care and coordination of care plan.     Karina Mattock, MD 10/19/2023 5:46 PM

## 2023-10-19 NOTE — Progress Notes (Signed)
 Radiation Oncology         [phone] ________________________________  Multidisciplinary Breast Oncology Clinic [facility]) Initial Outpatient Consultation  Name: Karina [name] MRN: [id number]  Date: 10/19/2023  DOB: July 04, 1955  RR:Untzm, Laine LABOR, MD  Curvin Deward MOULD, MD   REFERRING PHYSICIAN: Curvin Deward III, MD  DIAGNOSIS: The encounter diagnosis was Malignant neoplasm of lower-outer quadrant of right breast of female, estrogen receptor positive (HCC).  Stage IA (cT1c, cN0, cM0) Right Breast LOQ, Invasive Lobular Carcinoma, ER+ / PR+ / Her2-, Grade 1    ICD-10-CM   1. Malignant neoplasm of lower-outer quadrant of right breast of female, estrogen receptor positive (HCC)  C50.511    Z17.0       HISTORY OF PRESENT ILLNESS::Karina [name] is a 68 y.o. female who is presenting to the office today for evaluation of her newly diagnosed breast cancer. She is accompanied by her husband. She is doing well overall.   She presented to medical attention last month with right nipple inversion and c/o tenderness and firmness to the right retroareolar region. She accordingly underwent a bilateral diagnostic mammography with tomography and right breast ultrasonography at [facility] on 10[facility]03[facility]23 showing: an irregular 1.3 cm mass-like area with shadowing over the retroareolar region of the 6 o'clock right breast. No abnormal right axillary lymph nodes were demonstrated.   Biopsy of the retroareolar right breast mass on 10/07/23 showed: grade 1 invasive lobular carcinoma measuring 8 mm in the greatest linear extent of the sample. Prognostic indicators significant for: estrogen receptor, 100% positive and progesterone receptor, 100% positive, both with strong staining intensity. Proliferation marker Ki67 at 1%. HER2 negative.  Menarche: 68 years old Age at first live birth: 68 years old GP: 3 LMP: 58 after tubal ligation  Contraceptive: never used HRT: never used   The patient was referred  today for presentation in the multidisciplinary conference.  Radiology studies and pathology slides were presented there for review and discussion of treatment options.  A consensus was discussed regarding potential next steps.  PREVIOUS RADIATION THERAPY: No  PAST MEDICAL HISTORY:  Past Medical History:  Diagnosis Date   Allergic rhinitis    Allergy    Anxiety    Councelor- Slater Darner, no per pt   Cataract    Colon polyps 08/2008   Adenomatous   COPD (chronic obstructive pulmonary disease) (HCC)    Depression    no per pt   Diabetes mellitus    Gastritis 08/2008   H. pylori   GERD (gastroesophageal reflux disease)    Hypercholesteremia    Lung nodule    Menopausal disorder    Multiple sclerosis (HCC)    Neuromuscular disorder (HCC)    Plantar fasciitis    Seizures (HCC)    1 seizure in 2001 due to blood sugar dropping to 38, none since   Shingles    Left V1   Urinary incontinence    Vertigo     PAST SURGICAL HISTORY: Past Surgical History:  Procedure Laterality Date   BREAST BIOPSY Right 10/07/2023   US  RT BREAST BX W LOC DEV 1ST LESION IMG BX SPEC US  GUIDE 10/07/2023 [facility] MAMMOGRAPHY   CATARACT EXTRACTION Bilateral 2023   CERVICAL DISCECTOMY  03/08/2002   x 2, Anterior; Fusion C4-5, C5-6, C6-7, Dr. Arley Helling   CHEST CT  11/06/2008   With small 4 mm nodule L lung base (rec re check in 1 year)   CHEST CT  07/06/2009   Re check chest  CT - lung nodule stable   CHOLECYSTECTOMY     COLONOSCOPY  08/06/2008   Polyps/ re check 5 yrs   CT SINUS LTD W/O CM  02/05/2009   negative   DILATION AND CURETTAGE OF UTERUS     ESOPHAGOGASTRODUODENOSCOPY  08/06/2008   Erosive gastritis, h pylori (treated)   FOOT SURGERY     left plantar fascial problem   ROTATOR CUFF REPAIR     right   TONSILLECTOMY     TUBAL LIGATION     UPPER GASTROINTESTINAL ENDOSCOPY      FAMILY HISTORY: She denies any family history of breast cancer  Family History  Problem Relation Age of Onset    Migraines Mother    Heart attack Mother    Coronary artery disease Mother    Coronary artery disease Father        6 bypasses    Stroke Father    Diabetes Father    Renal Disease Father    Colon cancer Maternal Grandfather 79   Coronary artery disease Paternal Grandmother    Lung cancer Paternal Aunt    Coronary artery disease Cousin    Multiple sclerosis Neg Hx    Esophageal cancer Neg Hx    Rectal cancer Neg Hx    Stomach cancer Neg Hx    Breast cancer Neg Hx     SOCIAL HISTORY:  Social History   Socioeconomic History   Marital status: Married    Spouse name: Not on file   Number of children: 3   Years of education: 9 th   Highest education level: Not on file  Occupational History   Occupation: Disabled     Employer: DISABLED  Tobacco Use   Smoking status: Former    Current packs/day: 0.00    Average packs/day: 2.0 packs/day for 30.0 years (60.0 ttl pk-yrs)    Types: Cigarettes    Start date: 05/07/1991    Quit date: 05/06/2021    Years since quitting: 2.4   Smokeless tobacco: Never  Vaping Use   Vaping status: Never Used  Substance and Sexual Activity   Alcohol use: No    Alcohol/week: 0.0 standard drinks of alcohol   Drug use: No   Sexual activity: Not on file  Other Topics Concern   Not on file  Social History Narrative   Married with 3 children   Disabled.   Education 9th grade    Caffeine one cup of coffee daily.   Patient is right handed.    Social Drivers of Corporate investment banker Strain: Low Risk  (09/30/2022)   Overall Financial Resource Strain (CARDIA)    Difficulty of Paying Living Expenses: Not hard at all  Food Insecurity: No Food Insecurity (09/30/2022)   Hunger Vital Sign    Worried About Running Out of Food in the Last Year: Never true    Ran Out of Food in the Last Year: Never true  Transportation Needs: No Transportation Needs (09/30/2022)   PRAPARE - Administrator, Civil Service (Medical): No    Lack of Transportation  (Non-Medical): No  Physical Activity: Inactive (09/30/2022)   Exercise Vital Sign    Days of Exercise per Week: 0 days    Minutes of Exercise per Session: 0 min  Stress: Stress Concern Present (09/30/2022)   [employer] of Occupational Health - Occupational Stress Questionnaire    Feeling of Stress : Very much  Social Connections: Moderately Isolated (09/30/2022)   Social Connection and  Isolation Panel    Frequency of Communication with Friends and Family: Never    Frequency of Social Gatherings with Friends and Family: Never    Attends Religious Services: More than 4 times per year    Active Member of [employer] or Organizations: No    Attends Engineer, structural: Never    Marital Status: Married    ALLERGIES:  Allergies  Allergen Reactions   Atorvastatin Other (See Comments)    elevated LFT's   Percocet [Oxycodone-Acetaminophen ] Shortness Of Breath    Felt like going to pass out and sweating   Tecfidera [Dimethyl Fumarate] Shortness Of Breath, Palpitations, Rash and Cough   Amitriptyline Hcl Itching and Swelling   Betaseron [Interferon Beta-1b] Other (See Comments)    Headache   Duloxetine Other (See Comments)    pain   Pregabalin Other (See Comments)    LE swelling   Zoloft  [Sertraline  Hcl] Other (See Comments)    Headaches    Crestor  [Rosuvastatin ] Other (See Comments)    Muscle aches and cramps to generic crestor  and other statins   Clarithromycin Other (See Comments)    reaction not known   Levofloxacin Rash   Pseudoephedrine Other (See Comments)    legs hurt    MEDICATIONS:  Current Outpatient Medications  Medication Sig Dispense Refill   albuterol  (VENTOLIN  HFA) 108 (90 Base) MCG/ACT inhaler INHALE 2 PUFFS UP TO EVERY 4 HOURS AS NEEDED FOR WHEEZING 3 each 3   ALPRAZolam  (XANAX ) 0.5 MG tablet Take 1 tablet (0.5 mg total) by mouth 2 (two) times daily as needed. For anxiety. 30 tablet 0   aspirin  EC 81 MG tablet Take 1 tablet (81 mg total) by mouth  daily. Swallow whole. 90 tablet 3   budesonide -formoterol  (SYMBICORT ) 160-4.5 MCG/ACT inhaler Inhale 2 puffs into the lungs 2 (two) times daily. (Patient taking differently: Inhale 2 puffs into the lungs 2 (two) times daily as needed.) 3 each 1   cholecalciferol (VITAMIN D3) 25 MCG (1000 UNIT) tablet Take 1,000 Units by mouth daily.     famotidine  (PEPCID ) 20 MG tablet TAKE 1 TABLET (20 MG TOTAL) BY MOUTH DAILY. TAKE BEFORE DINNER 90 tablet 2   fluticasone  (FLONASE ) 50 MCG/ACT nasal spray Place 2 sprays into both nostrils daily. (Patient taking differently: Place 2 sprays into both nostrils daily as needed.) 16 g 11   gabapentin  (NEURONTIN ) 300 MG capsule TAKE 2 CAPSULES BY MOUTH AT BEDTIME. 180 capsule 1   ipratropium-albuterol  (DUONEB) 0.5-2.5 (3) MG/3ML SOLN TAKE 3 MLS BY NEBULIZATION EVERY 6 (SIX) HOURS AS NEEDED. 360 mL 0   methylcellulose (CITRUCEL) oral powder 1 tsp daily     Multiple Vitamin (MULTIVITAMIN) tablet Take 1 tablet by mouth daily.     nicotine (NICODERM CQ - DOSED IN MG/24 HOURS) 21 mg/24hr patch Place 21 mg onto the skin daily.     pantoprazole  (PROTONIX ) 40 MG tablet Take 1 tablet (40 mg total) by mouth daily before breakfast.     rosuvastatin  (CRESTOR ) 10 MG tablet Take 1 tablet (10 mg total) by mouth daily. 90 tablet 3   Spacer/Aero-Holding Chambers (AEROCHAMBER MV) inhaler Use as instructed 1 each 0   No current facility-administered medications for this encounter.    REVIEW OF SYSTEMS: A 10+ POINT REVIEW OF SYSTEMS WAS OBTAINED including neurology, dermatology, psychiatry, cardiac, respiratory, lymph, extremities, GI, GU, musculoskeletal, constitutional, reproductive, HEENT. On the provided form, she reports chills, night sweats, fluctuating weight changes, loss of sleep, fatigue that effects her ADL's, lower back  pain, hip pain, foot pain, wearing glasses, blurred vision (s/p cataract surgery), ear drainage, ringing in ears, sinus problems, dentures, occasional trouble  swallowing at times, vocal hoarseness, irregular heart beat, mild foot swelling, sleep apnea, SOB with walking and using the stairs, sleeping with 1 pillow beneath her neck, stomach, and knee, a productive cough, poor appetite, a history of ulcers, incontinence when coughing, nipple inversion, psoriasis, bruising easily, back pain, joint pain, arthritis, generalized weakness, mild forgetfulness, anemia, swollen lymph nodes, steroid use, and a history of an immune disorder. She denies any other symptoms.    PHYSICAL EXAM:     10/19/2023  Vitals with BMI   Height 5' 0   Weight 180 lbs 13 oz   BMI 35.31   Systolic 127   Diastolic 87   Pulse 72    Lungs are clear to auscultation bilaterally. Heart has regular rate and rhythm. No palpable cervical, supraclavicular, or axillary adenopathy. Abdomen soft, non-tender, normal bowel sounds. Breast: Left breast with no palpable mass, nipple discharge, or bleeding. Right breast with nipple inversion and an area of induration present underneath the breast measuring approximately 2 x 2 cm. Some bruising is also present from the biopsy. No nipple discharge or bleeding is present.   KPS = 90  100 - Normal; no complaints; no evidence of disease. 90   - Able to carry on normal activity; minor signs or symptoms of disease. 80   - Normal activity with effort; some signs or symptoms of disease. 46   - Cares for self; unable to carry on normal activity or to do active work. 60   - Requires occasional assistance, but is able to care for most of his personal needs. 50   - Requires considerable assistance and frequent medical care. 40   - Disabled; requires special care and assistance. 30   - Severely disabled; hospital admission is indicated although death not imminent. 20   - Very sick; hospital admission necessary; active supportive treatment necessary. 10   - Moribund; fatal processes progressing rapidly. 0     - Dead  Karnofsky DA, Abelmann WH, Craver LS  and [facility] [phone]) The use of the nitrogen mustards in the palliative treatment of carcinoma: with particular reference to bronchogenic carcinoma Cancer 1 634-56  LABORATORY DATA:  Lab Results  Component Value Date   WBC 7.3 10/19/2023   HGB 14.3 10/19/2023   HCT 43.7 10/19/2023   MCV 93.6 10/19/2023   PLT 242 10/19/2023   Lab Results  Component Value Date   NA 138 10/19/2023   K 4.7 10/19/2023   CL 101 10/19/2023   CO2 33 (H) 10/19/2023   Lab Results  Component Value Date   ALT 12 10/19/2023   AST 16 10/19/2023   ALKPHOS 77 10/19/2023   BILITOT 0.4 10/19/2023    PULMONARY FUNCTION TEST:   Review Flowsheet       Latest Ref Rng & Units 01/14/2021  Spirometry  FVC-%Pred-Pre % 49   FVC-%Pred-Post % 52   FEV1-Pre L 0.61   FEV1-%Pred-Pre % 27   FEV1-Post L 0.64   FEV1-%Pred-Post % 28   DLCO unc ml/min/mmHg 8.80     RADIOGRAPHY: US  RT BREAST BX W LOC DEV 1ST LESION IMG BX SPEC US  GUIDE Addendum Date: 10/14/2023 ADDENDUM REPORT: 10/14/2023 08:39 ADDENDUM: PATHOLOGY revealed: 1. Breast, right, needle core biopsy, retroareolar mass - INVASIVE LOBULAR CARCINOMA - OVERALL GRADE: 1. - LYMPHOVASCULAR INVASION: NOT IDENTIFIED - INVASIVE CANCER LENGTH: 0.8 CM (8 MM). -  CALCIFICATION: NOT IDENTIFIED. Pathology results are CONCORDANT with imaging findings, per Rosina Gelineau M.D. Pathology results and recommendations were discussed with patient via telephone on 10/11/2023 by Hendricks Benders RN. Patient reported biopsy site doing well with no adverse symptoms, and slight tenderness and bruising at the site. Post biopsy care instructions were reviewed, questions were answered and my direct phone number was provided. Patient was instructed to call [facility] for any additional questions or concerns related to biopsy site. RECOMMENDATIONS: 1. Surgical and oncological consultation. Patient was referred to [facility] at [facility] with appointment on 10/19/2023. Pathology results reported by Rock Hover RN on 10/11/2023. Electronically Signed   By: Rosina Gelineau M.D.   On: 10/14/2023 08:39   Result Date: 10/14/2023 CLINICAL DATA:  68 year old female with right nipple inversion and palpable lump behind the right nipple measuring 1 x 1.7 cm presents today for ultrasound-guided biopsy of right retroareolar mass. EXAM: ULTRASOUND GUIDED RIGHT BREAST CORE NEEDLE BIOPSY COMPARISON:  Previous exam(s). PROCEDURE: I met with the patient and we discussed the procedure of ultrasound-guided biopsy, including benefits and alternatives. We discussed the high likelihood of a successful procedure. We discussed the risks of the procedure, including infection, bleeding, tissue injury, clip migration, and inadequate sampling. Informed written consent was given. The usual time-out protocol was performed immediately prior to the procedure. Lesion quadrant: Right retroareolar Using sterile technique and 1% Lidocaine with and without epinephrine as local anesthetic, under direct ultrasound visualization, a 14 gauge spring-loaded device was used to perform biopsy of an irregular mixed echogenicity 1 x 1.7 cm palpable mass behind the right nipple using an inferior approach. At the conclusion of the procedure ribbon shaped tissue marker clip was deployed into the biopsy cavity. Follow up 2 view mammogram was performed and dictated separately. IMPRESSION: Ultrasound guided biopsy of right retroareolar mass. No apparent complications. Electronically Signed: By: Rosina Gelineau M.D. On: 10/07/2023 12:32   MM CLIP PLACEMENT RIGHT Result Date: 10/07/2023 CLINICAL DATA:  68 year old female with palpable retroareolar mass in right nipple inversion status post ultrasound-guided biopsy EXAM: 3D DIAGNOSTIC RIGHT MAMMOGRAM POST ULTRASOUND BIOPSY COMPARISON:  Previous exam(s). ACR Breast Density Category a: The breasts are almost entirely fatty. FINDINGS:  3D Mammographic images were obtained following ultrasound guided biopsy of a 1.7 cm right retroareolar mass. The biopsy marking clip is in expected position at the site of biopsy. IMPRESSION: Appropriate positioning of the ribbon shaped biopsy marking clip at the site of biopsy in the right retroareolar breast. Final Assessment: Post Procedure Mammograms for Marker Placement Electronically Signed   By: Rosina Gelineau M.D.   On: 10/07/2023 12:39   MM 3D DIAGNOSTIC MAMMOGRAM BILATERAL BREAST Result Date: 10/03/2023 CLINICAL DATA:  Patient presents for bilateral diagnostic examination due to right nipple inversion which feels worse at times. Patient also describes tenderness and firmness to the right retroareolar region. Patient notes tenderness in the right axilla. EXAM: DIGITAL DIAGNOSTIC BILATERAL MAMMOGRAM WITH TOMOSYNTHESIS AND CAD; ULTRASOUND RIGHT BREAST LIMITED TECHNIQUE: Bilateral digital diagnostic mammography and breast tomosynthesis was performed. The images were evaluated with computer-aided detection. ; Targeted ultrasound examination of the right breast was performed COMPARISON:  Previous exam(s). ACR Breast Density Category a: The breasts are almost entirely fatty. FINDINGS: Exam demonstrates focal masslike irregular density over the right retroareolar region abutting the nipple. Remainder of the right breast is unchanged. Left breast is unchanged. Targeted ultrasound is performed, showing a focal masslike irregular hypoechoic  area with shadowing over the right retroareolar region best seen from the 6 o'clock position. This measures approximately 0.5 x 0.7 x 1.3 cm and corresponds to the mammographic finding. Ultrasound of the right axilla is unremarkable. IMPRESSION: Irregular 1.3 cm masslike area with shadowing over the right retroareolar region best seen from the 6 o'clock position sonographically. No abnormal right axillary lymph nodes. RECOMMENDATION: Recommend ultrasound-guided core needle  biopsy for further evaluation. I have discussed the findings and recommendations with the patient. If applicable, a reminder letter will be sent to the patient regarding the next appointment. BI-RADS CATEGORY  4: Suspicious. Biopsy scheduling will be facilitated by the sonographer prior to [facility]'s departure from the [facility]. Electronically Signed   By: Toribio Agreste M.D.   On: 10/03/2023 14:02   US  LIMITED ULTRASOUND INCLUDING AXILLA RIGHT BREAST Result Date: 10/03/2023 CLINICAL DATA:  Patient presents for bilateral diagnostic examination due to right nipple inversion which feels worse at times. Patient also describes tenderness and firmness to the right retroareolar region. Patient notes tenderness in the right axilla. EXAM: DIGITAL DIAGNOSTIC BILATERAL MAMMOGRAM WITH TOMOSYNTHESIS AND CAD; ULTRASOUND RIGHT BREAST LIMITED TECHNIQUE: Bilateral digital diagnostic mammography and breast tomosynthesis was performed. The images were evaluated with computer-aided detection. ; Targeted ultrasound examination of the right breast was performed COMPARISON:  Previous exam(s). ACR Breast Density Category a: The breasts are almost entirely fatty. FINDINGS: Exam demonstrates focal masslike irregular density over the right retroareolar region abutting the nipple. Remainder of the right breast is unchanged. Left breast is unchanged. Targeted ultrasound is performed, showing a focal masslike irregular hypoechoic area with shadowing over the right retroareolar region best seen from the 6 o'clock position. This measures approximately 0.5 x 0.7 x 1.3 cm and corresponds to the mammographic finding. Ultrasound of the right axilla is unremarkable. IMPRESSION: Irregular 1.3 cm masslike area with shadowing over the right retroareolar region best seen from the 6 o'clock position sonographically. No abnormal right axillary lymph nodes. RECOMMENDATION: Recommend ultrasound-guided core needle biopsy for further evaluation. I have  discussed the findings and recommendations with the patient. If applicable, a reminder letter will be sent to the patient regarding the next appointment. BI-RADS CATEGORY  4: Suspicious. Biopsy scheduling will be facilitated by the sonographer prior to patient's departure from the [facility]. Electronically Signed   By: Toribio Agreste M.D.   On: 10/03/2023 14:02      IMPRESSION: Stage IA (cT1c, cN0, cM0) Right Breast LOQ, Invasive Lobular Carcinoma, ER+ / PR+ / Her2-, Grade 1  Patient will be a good candidate for breast conservation with radiotherapy to right breast. We discussed the general course of radiation, potential side effects, and toxicities with radiation and the patient is interested in this approach.    PLAN:  Genetics  MRI Right breast lumpectomy with SLN excisions (possibly considering mastectomy) Oncotype  Adjuvant radiation therapy if she pursues breast conserving surgery or if her nodes are positive at mastectomy Aromatase Inhibitor    ------------------------------------------------  Lynwood CHARM Nasuti, PhD, MD  This document serves as a record of services personally performed by Lynwood Nasuti, MD. It was created on his behalf by Dorthy Fuse, a trained medical scribe. The creation of this record is based on the scribe's personal observations and the provider's statements to them. This document has been checked and approved by the attending provider.

## 2023-10-19 NOTE — Research (Signed)
 Trial:  [employer] 2021-05 - Specimen Collection Study to Evaluate Biomarkers in Subjects with Cancer   Patient Karina [name] was identified by Dr. Lanny as a potential candidate for the above listed study.  This Clinical Research Nurse met with Karina [name], [id number], on 10/19/23 in a manner and location that ensures patient privacy to discuss participation in the above listed research study.  Patient is Accompanied by her husband.  A copy of the informed consent document with embedded HIPAA language was provided to the patient.  Patient reads, speaks, and understands [place].   Patient was provided with the business card of this Nurse and encouraged to contact the research team with any questions.  Approximately 10 minutes were spent with the patient reviewing the informed consent documents.  Patient was provided the option of taking informed consent documents home to review and was encouraged to review at their convenience with their support network, including other care providers. Patient took the consent documents home to review. Patient understands if she wants to participate in the above study, then she would need to return to [facility] for a consent visit and blood draw prior to her breast surgery or any cancer treatment. Patient did not agree for research staff to call her to follow up on study. Patient states she will call this research nurse if she has any questions and if she wants to participate.  Thanked patient for her time.  Cherylyn Hoard, BSN, RN, [employer] Clinical Research Nurse II [phone] 10/19/2023 3:15 PM

## 2023-10-20 ENCOUNTER — Encounter (HOSPITAL_BASED_OUTPATIENT_CLINIC_OR_DEPARTMENT_OTHER): Payer: Self-pay | Admitting: General Surgery

## 2023-10-20 ENCOUNTER — Other Ambulatory Visit: Payer: Self-pay

## 2023-10-20 ENCOUNTER — Other Ambulatory Visit: Payer: Self-pay | Admitting: General Surgery

## 2023-10-20 DIAGNOSIS — C50111 Malignant neoplasm of central portion of right female breast: Secondary | ICD-10-CM

## 2023-10-23 NOTE — Therapy (Signed)
 OUTPATIENT PHYSICAL THERAPY BREAST CANCER BASELINE EVALUATION   Patient Name: Karina [name] MRN: [id number] DOB:12-19-1955, 68 y.o., female Today's Date: 10/24/2023  END OF SESSION:  PT End of Session - 10/24/23 1634     Visit Number 1    Number of Visits 2    Date for PT Re-Evaluation 12/19/23    Authorization Type none    PT Start Time 1500    PT Stop Time 1535    PT Time Calculation (min) 35 min    Activity Tolerance Patient tolerated treatment well    Behavior During Therapy WFL for tasks assessed/performed          Past Medical History:  Diagnosis Date   Allergic rhinitis    Allergy    Anxiety    [place], no per pt   Cataract    Colon polyps 08/2008   Adenomatous   COPD (chronic obstructive pulmonary disease) (HCC)    Depression    no per pt   Diabetes mellitus    Gastritis 08/2008   H. pylori   GERD (gastroesophageal reflux disease)    Hypercholesteremia    Hypercholesteremia    Lung nodule    Menopausal disorder    Multiple sclerosis (HCC)    Neuromuscular disorder (HCC)    Neuropathy    Plantar fasciitis    Seizures (HCC)    1 seizure in 2001 due to blood sugar dropping to 38, none since   Shingles    Left V1   Ulcerative colitis (HCC)    Urinary incontinence    Vertigo    Past Surgical History:  Procedure Laterality Date   BREAST BIOPSY Right 10/07/2023   US  RT BREAST BX W LOC DEV 1ST LESION IMG BX SPEC US  GUIDE 10/07/2023 [facility]MO[facility]   CATARACT EXTRACTION Bilateral 2023   CERVICAL DISCECTOMY  03/08/2002   x 2, Anterior; Fusion C4-5, C5-6, C6-7, Dr. Arley Helling   CHEST CT  11/06/2008   With small 4 mm nodule L lung base (rec re check in 1 year)   CHEST CT  07/06/2009   Re check chest CT - lung nodule stable   CHOLECYSTECTOMY     COLONOSCOPY  08/06/2008   Polyps/ re check 5 yrs   CT SINUS LTD W/O CM  02/05/2009   negative   DILATION AND CURETTAGE OF UTERUS     ESOPHAGOGASTRODUODENOSCOPY  08/06/2008   Erosive  gastritis, h pylori (treated)   FOOT SURGERY     left plantar fascial problem   ROTATOR CUFF REPAIR     right   TONSILLECTOMY     TUBAL LIGATION     UPPER GASTROINTESTINAL ENDOSCOPY     Patient Active Problem List   Diagnosis Date Noted   Malignant neoplasm of lower-outer quadrant of right breast of female, estrogen receptor positive (HCC) 10/17/2023   Lump of breast, right 09/30/2023   Bilateral hip pain 09/30/2023   Depressed mood 10/04/2022   Left flank pain 07/31/2022   Petechiae 07/26/2022   Globus sensation 07/26/2022   Occult blood positive stool 05/25/2021   Colon cancer screening 05/07/2021   Estrogen deficiency 05/07/2021   Rheumatoid arthritis (HCC) 03/31/2021   Coronary artery calcification 03/31/2021   Hypokalemia 08/27/2020   Joint pain in both hands 10/12/2019   Head pain 08/22/2019   Elevated serum creatinine 04/24/2019   Frequent urination 04/20/2019   Decreased appetite 04/20/2019   Encounter for screening mammogram for breast cancer 04/20/2019   Seborrheic keratosis, inflamed 12/15/2017  B12 deficiency 08/24/2017   Left low back pain 08/24/2017   Erosive osteoarthritis of both hands 09/09/2016   Fatigue 09/07/2016   Bilateral hand pain 09/07/2016   Routine general medical examination at a health care facility 05/06/2015   Large breasts 05/06/2015   Lumbar disc disease 11/01/2014   Knee pain, bilateral 03/11/2014   Thoracic back pain 11/30/2013   Encounter for routine gynecological examination 01/31/2013   Encounter for Medicare annual wellness exam 01/23/2013   Chest pain 09/20/2012   Dyspnea 09/13/2012   Asthmatic bronchitis , chronic (HCC) 12/06/2011   Chronic cough 11/22/2011   Anxiety disorder 07/17/2009   HEMORRHOIDS-EXTERNAL 11/07/2008   IRRITABLE BOWEL SYNDROME 11/07/2008   Lung nodule 10/14/2008   Prediabetes 10/14/2008   History of colonic polyps 10/08/2008   Vitamin D  deficiency 07/23/2008   SYNCOPE, HX OF 06/01/2008   FOOT SURGERY,  HX OF 06/01/2008   DILATION AND CURETTAGE, HX OF 06/01/2008   OSTEOARTHRITIS, SHOULDER, RIGHT 03/22/2008   ROTATOR CUFF SYNDROME, RIGHT 03/22/2008   ARTHRALGIA 12/12/2007   Allergic rhinitis 08/01/2007   Former smoker 10/17/2006   Peripheral neuropathy 10/17/2006   FOOT PAIN, BILATERAL 10/17/2006   HYPOGLYCEMIA 09/30/2006   HYPERCHOLESTEROLEMIA 09/30/2006   MULTIPLE SCLEROSIS 09/30/2006   GERD 09/30/2006   FIBROCYSTIC BREAST DISEASE 09/30/2006    PCP: Laine Balls, MD  REFERRING PROVIDER: Deward Null, MD  REFERRING DIAG: Diagnosis C50.511,Z17.0 (ICD-10-CM) - Malignant neoplasm of lower-outer quadrant of right breast of female, estrogen receptor positive (HCC)   THERAPY DIAG:  Malignant neoplasm of lower-outer quadrant of right breast of female, estrogen receptor positive (HCC)  Abnormal posture  Rationale for Evaluation and Treatment: Rehabilitation  ONSET DATE: 10/03/23  SUBJECTIVE:                                                                                                                                                                                           SUBJECTIVE STATEMENT: Patient reports she is here today to be seen by her medical team for her newly diagnosed right breast cancer. I have MS and a shoulder replacement on that side.  I have a lot of pain on that side.  I have trouble with bras being too tight on a bad nerve I have.    PERTINENT HISTORY:  Patient was diagnosed with right ILC.  It measures 1.3 cm and is located in the lower outer quadrant. It is ER/PR positive with a Ki67 of 1%. Medical hx includes MS, COPD, UC, Rt shoulder replacement, cervical fusion all levels per pt. Pt will have Rt lumpectomy 10/28/23 with SLNB. Will have radiation for sure.    PATIENT  GOALS:   reduce lymphedema risk and learn post op HEP.   PAIN:  Are you having pain? Yes: NPRS scale: Rt sided neck Pain location: Rt sided neck  Pain description: tight and burning   Aggravating factors: using the arm a lot.   Relieving factors: heating pads, icy hot, voltaren     PRECAUTIONS: Active CA   RED FLAGS: None   HAND DOMINANCE: right  WEIGHT BEARING RESTRICTIONS: No  FALLS:  Has patient fallen in last 6 months? No - has more of hand tremors with the MS  LIVING ENVIRONMENT: Patient lives with: husband   OCCUPATION: disability   LEISURE: it wears me out just to get dressed.    PRIOR LEVEL OF FUNCTION: Independent with basic ADLs - I try to use my shower chair if needed.     OBJECTIVE: Note: Objective measures were completed at Evaluation unless otherwise noted.  COGNITION: Overall cognitive status: Within functional limits for tasks assessed    POSTURE:  Forward head and rounded shoulders posture  UPPER EXTREMITY AROM/PROM:  A/PROM RIGHT   eval   Shoulder extension 38- pn  Shoulder flexion 160 - pn  Shoulder abduction 165 - pn  Shoulder internal rotation 65  Shoulder external rotation 80 - pn    (Blank rows = not tested)  A/PROM LEFT   eval  Shoulder extension 65  Shoulder flexion 155  Shoulder abduction 170  Shoulder internal rotation 65  Shoulder external rotation 85    (Blank rows = not tested)  CERVICAL AROM: All limited due to cervical fusion but no pain reproduced   Pn reproduced with palpation to the Rt UT - reviewed Rt UT, levator and posterior shoulder stretches which pt reports were part of her HEP previously.  Also self TpR and heat.    UPPER EXTREMITY STRENGTH: WFL   LYMPHEDEMA ASSESSMENTS (in cm):   LANDMARK RIGHT   eval  10 cm proximal to olecranon process 32.4  Olecranon process 26.1  10 cm proximal to ulnar styloid process 20.8  Just proximal to ulnar styloid process 16.6  Across hand at thumb web space 19.3  At base of 2nd digit 6.3  (Blank rows = not tested)  LANDMARK LEFT   eval  10 cm proximal to olecranon process 32.4  Olecranon process 26.5  10 cm proximal to ulnar styloid process 21.6   Just proximal to ulnar styloid process 15.8  Across hand at thumb web space 19.4  At base of 2nd digit 6.4  (Blank rows = not tested)  L-DEX LYMPHEDEMA SCREENING: The patient was assessed using the L-Dex machine today to produce a lymphedema index baseline score. The patient will be reassessed on a regular basis (typically every 3 months) to obtain new L-Dex scores. If the score is > 6.5 points away from his/her baseline score indicating onset of subclinical lymphedema, it will be recommended to wear a compression garment for 4 weeks, 12 hours per day and then be reassessed. If the score continues to be > 6.5 points from baseline at reassessment, we will initiate lymphedema treatment. Assessing in this manner has a 95% rate of preventing clinically significant lymphedema.   QUICK DASH SURVEY: 47% limited   PATIENT EDUCATION:  Education details: Time spent educating patient on aspects of self-care to maximize post op recovery. Patient was educated on where and how to get a post op compression bra to use to reduce post op edema. Patient was also educated on the use of SOZO screenings and surveillance principles  for early identification of lymphedema onset. She was instructed to use the post op pillow in the axilla for pressure and pain relief. Patient educated on lymphedema risk reduction and post op shoulder/posture HEP. Person educated: Patient Education method: Explanation, Demonstration, Handout Education comprehension: Patient verbalized understanding and returned demonstration  HOME EXERCISE PROGRAM: Patient was instructed today in a home exercise program today for post op shoulder range of motion. These included active assist shoulder flexion in sitting, scapular retraction, wall walking with shoulder abduction, and hands behind head external rotation.  She was encouraged to do these twice a day, holding 3 seconds and repeating 5 times when permitted by her  physician.   ASSESSMENT:  CLINICAL IMPRESSION: Pt has Rt shoulder and neck pain and limited ROM due to MS and cervical fusion.  She also has trouble with a nerve along the Rt Thoracic spine that is unable to tolerate regular bra wear, so we discussed how she can try compression bra or binder but is able to not wear it if it is too painful.  Reviewed neck stretches and self release which pt was familiar with from previous PT.  She will benefit from a post op PT reassessment to determine needs and from L-Dex screens every 3 months for 2 years to detect subclinical lymphedema.  Pt will benefit from skilled therapeutic intervention to improve on the following deficits: Decreased knowledge of precautions, impaired UE functional use, pain, decreased ROM, postural dysfunction.   PT treatment/interventions: ADL/self-care home management, pt/family education, therapeutic exercise  REHAB POTENTIAL: Excellent  CLINICAL DECISION MAKING: Stable/uncomplicated  EVALUATION COMPLEXITY: Low   GOALS: Goals reviewed with patient? YES  LONG TERM GOALS: (STG=LTG)    Name Target Date Goal status  1 Pt will be able to verbalize understanding of pertinent lymphedema risk reduction practices relevant to her dx specifically related to skin care.  Baseline:  No knowledge 10/24/2023 Achieved at eval  2 Pt will be able to return demo and/or verbalize understanding of the post op HEP related to regaining shoulder ROM. Baseline:  No knowledge 10/24/2023 Achieved at eval  3 Pt will be able to verbalize understanding of the importance of viewing the post op After Breast CA Class video for further lymphedema risk reduction education and therapeutic exercise.  Baseline:  No knowledge 10/24/2023 Achieved at eval  4 Pt will demo she has regained full shoulder ROM and function post operatively compared to baselines.  Baseline: See objective measurements taken today. 11/24/23     PLAN:  PT FREQUENCY/DURATION: EVAL and 1  follow up appointment.   PLAN FOR NEXT SESSION: will reassess 3-4 weeks post op to determine needs.   Patient will follow up at outpatient cancer rehab 3-4 weeks following surgery.  If the patient requires physical therapy at that time, a specific plan will be dictated and sent to the referring physician for approval. The patient was educated today on appropriate basic range of motion exercises to begin post operatively and the importance of viewing the After Breast Cancer class video following surgery.  Patient was educated today on lymphedema risk reduction practices as it pertains to recommendations that will benefit the patient immediately following surgery.  She verbalized good understanding.    Physical Therapy Information for After Breast Cancer Surgery/Treatment:  Lymphedema is a swelling condition that you may be at risk for in your arm if you have lymph nodes removed from the armpit area.  After a sentinel node biopsy, the risk is approximately 5-9% and is higher  after an axillary node dissection.  There is treatment available for this condition and it is not life-threatening.  Contact your physician or physical therapist with concerns. You may begin the 4 shoulder/posture exercises (see additional sheet) when permitted by your physician (typically a week after surgery).  If you have drains, you may need to wait until those are removed before beginning range of motion exercises.  A general recommendation is to not lift your arms above shoulder height until drains are removed.  These exercises should be done to your tolerance and gently.  This is not a no pain/no gain type of recovery so listen to your body and stretch into the range of motion that you can tolerate, stopping if you have pain.  If you are having immediate reconstruction, ask your plastic surgeon about doing exercises as he or she may want you to wait. We encourage you to view the After Breast Cancer class video following  surgery.  You will learn information related to lymphedema risk, prevention and treatment and additional exercises to regain mobility following surgery.   While undergoing any medical procedure or treatment, try to avoid blood pressure being taken or needle sticks from occurring on the arm on the side of cancer.   This recommendation begins after surgery and continues for the rest of your life.  This may help reduce your risk of getting lymphedema (swelling in your arm). An excellent resource for those seeking information on lymphedema is the National Lymphedema Network's web site. It can be accessed at www.lymphnet.org If you notice swelling in your hand, arm or breast at any time following surgery (even if it is many years from now), please contact your doctor or physical therapist to discuss this.  Lymphedema can be treated at any time but it is easier for you if it is treated early on.  If you feel like your shoulder motion is not returning to normal in a reasonable amount of time, please contact your surgeon or physical therapist.  [facility] [phone]. [address], [address], [place] [address]  ABC CLASS After Breast Cancer Class  After Breast Cancer Class is a specially designed exercise class video to assist you in a safe recover after having breast cancer surgery.  In this video you will learn how to get back to full function whether your drains were just removed or if you had surgery a month ago. The video can be viewed on this page: [email or web address].boyd-meyer.org/ or on YouTube here: [email or web address].  Class Goals  Understand specific stretches to improve the flexibility of you chest and shoulder. Learn ways to safely strengthen your upper body and improve your posture. Understand the warning signs of infection and why you may be at risk for an arm infection. Learn about  Lymphedema and prevention.  ** You do not need to view this video until after surgery.  Drains should be removed to participate in the recommended exercises on the video.  Patient was instructed today in a home exercise program today for post op shoulder range of motion. These included active assist shoulder flexion in sitting, scapular retraction, wall walking with shoulder abduction, and hands behind head external rotation.  She was encouraged to do these twice a day, holding 3 seconds and repeating 5 times when permitted by her physician.    Larue Saddie SAUNDERS, PT 10/24/2023, 4:35 PM

## 2023-10-24 ENCOUNTER — Telehealth: Payer: Self-pay | Admitting: Neurology

## 2023-10-24 ENCOUNTER — Encounter: Payer: Self-pay | Admitting: General Practice

## 2023-10-24 ENCOUNTER — Ambulatory Visit: Attending: General Surgery | Admitting: Rehabilitation

## 2023-10-24 ENCOUNTER — Encounter: Payer: Self-pay | Admitting: Rehabilitation

## 2023-10-24 DIAGNOSIS — R293 Abnormal posture: Secondary | ICD-10-CM | POA: Diagnosis not present

## 2023-10-24 DIAGNOSIS — Z170 Estrogen receptor positive status [ER+]: Secondary | ICD-10-CM | POA: Diagnosis not present

## 2023-10-24 DIAGNOSIS — C50511 Malignant neoplasm of lower-outer quadrant of right female breast: Secondary | ICD-10-CM | POA: Insufficient documentation

## 2023-10-24 NOTE — Telephone Encounter (Signed)
 Pt called to cancel appt due to finding out that she has Breast Cancer and will need to get surgery   Appt Cancel

## 2023-10-24 NOTE — Progress Notes (Signed)
 [facility] Spiritual Care Note  Met with Karina [name] in Breast Multidisciplinary Clinic to introduce Support Center team/resources.  she completed SDOH screening; results follow below.  SDOH Interventions    Flowsheet Row Office Visit from 10/04/2022 in [facility] at [facility] Clinical Support from 09/30/2022 in [facility] at [facility] Office Visit from 07/26/2022 in [facility] at [facility] Clinical Support from 09/29/2021 in [facility] at [facility] Chronic Care Management from 06/10/2021 in [facility] at [facility] Clinical Support from 10/17/2019 in [facility] at [place]  SDOH Interventions        Food Insecurity Interventions -- Intervention Not Indicated -- Intervention Not Indicated Intervention Not Indicated --  Housing Interventions -- Intervention Not Indicated -- Intervention Not Indicated -- --  Transportation Interventions -- Intervention Not Indicated -- Intervention Not Indicated -- --  Utilities Interventions -- Intervention Not Indicated -- -- -- --  Alcohol Usage Interventions -- Intervention Not Indicated (Score <7) -- -- -- --  Depression Interventions/Treatment  Counseling [id number] Referral Currently on Treatment -- -- PHQ2-9 Score <4 Follow-up Not Indicated  Financial Strain Interventions -- Intervention Not Indicated -- Intervention Not Indicated Intervention Not Indicated --  Physical Activity Interventions -- Patient Declined -- Patient Refused -- --  Stress Interventions -- [id number] Referral -- Intervention Not Indicated -- --  Social Connections Interventions -- Intervention Not Indicated, [id number] Referral -- Intervention Not Indicated -- --  Health Literacy Interventions -- Intervention Not Indicated -- -- -- --    SDOH Screenings   Food Insecurity: No Food Insecurity (10/24/2023)  Housing: Low Risk  (10/24/2023)   Transportation Needs: No Transportation Needs (10/24/2023)  Utilities: Not At Risk (10/24/2023)  Alcohol Screen: Low Risk  (09/30/2022)  Depression (PHQ2-9): Low Risk  (10/24/2023)  Financial Resource Strain: Low Risk  (09/30/2022)  Physical Activity: Inactive (09/30/2022)  Social Connections: Moderately Isolated (09/30/2022)  Stress: Stress Concern Present (09/30/2022)  Tobacco Use: Medium Risk (10/20/2023)  Health Literacy: Adequate Health Literacy (09/30/2022)     Chaplain and patient discussed common feelings and emotions when being diagnosed with cancer, and the importance of support during treatment.  Chaplain informed patient of the support team and support services at [facility].  Chaplain provided contact information and encouraged patient to call with any questions or concerns.  Follow up needed: Yes.  Placed Alight Guide peer mentor referral per Karina [name]'s request. She welcomes a follow-up phone call from chaplain in ca 2 weeks.   [address] Karina [name], [place], [facility] Pager [phone] Voicemail [phone]

## 2023-10-25 ENCOUNTER — Ambulatory Visit (HOSPITAL_COMMUNITY)
Admission: RE | Admit: 2023-10-25 | Discharge: 2023-10-25 | Disposition: A | Discharge status: Home / Self Care | Source: Ambulatory Visit | Attending: Hematology | Admitting: Hematology

## 2023-10-25 DIAGNOSIS — N644 Mastodynia: Secondary | ICD-10-CM | POA: Diagnosis not present

## 2023-10-25 DIAGNOSIS — C50019 Malignant neoplasm of nipple and areola, unspecified female breast: Secondary | ICD-10-CM | POA: Diagnosis not present

## 2023-10-25 DIAGNOSIS — C50511 Malignant neoplasm of lower-outer quadrant of right female breast: Secondary | ICD-10-CM | POA: Insufficient documentation

## 2023-10-25 DIAGNOSIS — R928 Other abnormal and inconclusive findings on diagnostic imaging of breast: Secondary | ICD-10-CM | POA: Diagnosis not present

## 2023-10-25 DIAGNOSIS — C50911 Malignant neoplasm of unspecified site of right female breast: Secondary | ICD-10-CM | POA: Diagnosis not present

## 2023-10-25 DIAGNOSIS — Z170 Estrogen receptor positive status [ER+]: Secondary | ICD-10-CM | POA: Insufficient documentation

## 2023-10-25 MED ORDER — GADOBUTROL 1 MMOL/ML IV SOLN
8.0000 mL | Freq: Once | INTRAVENOUS | Status: AC | PRN
  Administered 2023-10-25: 8 mL via INTRAVENOUS

## 2023-10-26 DIAGNOSIS — C50511 Malignant neoplasm of lower-outer quadrant of right female breast: Secondary | ICD-10-CM | POA: Diagnosis not present

## 2023-10-26 NOTE — Progress Notes (Signed)
  G2 given with written/verbal instruction to complete by 0700 DOS. CHG soap given with written/verbal instruction. Pt verbalized understanding.         Enhanced Recovery after Surgery for Orthopedics Enhanced Recovery after Surgery is a protocol used to improve the stress on your body and your recovery after surgery.  Patient Instructions  The night before surgery:  No food after midnight. ONLY clear liquids after midnight  The day of surgery (if you do NOT have diabetes):  Drink ONE (1) Pre-Surgery Clear Ensure as directed.   This drink was given to you during your hospital  pre-op appointment visit. The pre-op nurse will instruct you on the time to drink the  Pre-Surgery Ensure depending on your surgery time. Finish the drink at the designated time by the pre-op nurse.  Nothing else to drink after completing the  Pre-Surgery Clear Ensure.  The day of surgery (if you have diabetes): Drink ONE (1) Gatorade 2 (G2) as directed. This drink was given to you during your hospital  pre-op appointment visit.  The pre-op nurse will instruct you on the time to drink the   Gatorade 2 (G2) depending on your surgery time. Color of the Gatorade may vary. Red is not allowed. Nothing else to drink after completing the  Gatorade 2 (G2).         If you have questions, please contact your surgeon's office.

## 2023-10-28 ENCOUNTER — Ambulatory Visit (HOSPITAL_COMMUNITY)

## 2023-10-28 ENCOUNTER — Ambulatory Visit
Admission: RE | Admit: 2023-10-28 | Discharge: 2023-10-28 | Disposition: A | Discharge status: Home / Self Care | Source: Ambulatory Visit | Attending: General Surgery | Admitting: General Surgery

## 2023-10-28 ENCOUNTER — Ambulatory Visit (HOSPITAL_BASED_OUTPATIENT_CLINIC_OR_DEPARTMENT_OTHER): Admission: RE | Admit: 2023-10-28 | Source: Home / Self Care | Admitting: General Surgery

## 2023-10-28 ENCOUNTER — Other Ambulatory Visit: Payer: Self-pay | Admitting: General Surgery

## 2023-10-28 ENCOUNTER — Telehealth: Payer: Self-pay | Admitting: *Deleted

## 2023-10-28 ENCOUNTER — Ambulatory Visit: Admission: RE | Admit: 2023-10-28 | Source: Ambulatory Visit

## 2023-10-28 ENCOUNTER — Encounter

## 2023-10-28 ENCOUNTER — Encounter: Payer: Self-pay | Admitting: *Deleted

## 2023-10-28 DIAGNOSIS — Z170 Estrogen receptor positive status [ER+]: Secondary | ICD-10-CM

## 2023-10-28 DIAGNOSIS — E119 Type 2 diabetes mellitus without complications: Secondary | ICD-10-CM

## 2023-10-28 DIAGNOSIS — R928 Other abnormal and inconclusive findings on diagnostic imaging of breast: Secondary | ICD-10-CM

## 2023-10-28 SURGERY — BREAST LUMPECTOMY WITH RADIOACTIVE SEED AND SENTINEL LYMPH NODE BIOPSY
Anesthesia: General | Site: Breast | Laterality: Right

## 2023-10-28 NOTE — Telephone Encounter (Signed)
 Spoke with patient to follow up from [facility] 8/13 and assess navigation needs.  She is disappointed that her sx was cx'd due to needing add bx per MRI. She is aware of the appt for the bx on 8/27.  Patient denies any questions or concerns at this time. Encouraged her to call should anything arise. Patient verbalized understanding.

## 2023-10-31 ENCOUNTER — Ambulatory Visit: Admitting: Neurology

## 2023-11-02 ENCOUNTER — Ambulatory Visit
Admission: RE | Admit: 2023-11-02 | Discharge: 2023-11-02 | Disposition: A | Discharge status: Home / Self Care | Source: Ambulatory Visit | Attending: General Surgery | Admitting: General Surgery

## 2023-11-02 ENCOUNTER — Other Ambulatory Visit (HOSPITAL_COMMUNITY): Payer: Self-pay | Admitting: Diagnostic Radiology

## 2023-11-02 DIAGNOSIS — R928 Other abnormal and inconclusive findings on diagnostic imaging of breast: Secondary | ICD-10-CM

## 2023-11-02 DIAGNOSIS — N641 Fat necrosis of breast: Secondary | ICD-10-CM | POA: Diagnosis not present

## 2023-11-02 MED ORDER — GADOPICLENOL 0.5 MMOL/ML IV SOLN
8.0000 mL | Freq: Once | INTRAVENOUS | Status: AC | PRN
  Administered 2023-11-02: 8 mL via INTRAVENOUS

## 2023-11-03 ENCOUNTER — Ambulatory Visit: Admitting: Orthopedic Surgery

## 2023-11-03 ENCOUNTER — Encounter: Payer: Self-pay | Admitting: *Deleted

## 2023-11-03 LAB — SURGICAL PATHOLOGY

## 2023-11-04 ENCOUNTER — Encounter: Payer: Self-pay | Admitting: General Practice

## 2023-11-04 NOTE — Progress Notes (Signed)
 [facility] Spiritual Care Note  Made [facility] follow-up phone call as planned. Ms Ohagan had several medical questions, which chaplain passed along to Drs Lanny and Curvin, and to breast navigators. In particular, Ms Hari is wondering about risk/benefit analysis of just surgery vs surgery/chemo/radiation, particularly within the setting of her struggles with MS.  Ms Loomer reports good support from her husband and her church; she was grateful that her pastor's wife took her to her last biopsy so her husband wouldn't miss work.  Provided compassionate presence, reflective listening, emotional support, affirmation of strengths, as well as link to her medical team re her questions. We plan to check in by phone in ca two weeks for updates and emotional support.   [address] Olam Corrigan, [place], [facility] Pager [phone] Voicemail [phone]

## 2023-11-08 ENCOUNTER — Telehealth: Payer: Self-pay | Admitting: Hematology

## 2023-11-08 NOTE — Telephone Encounter (Signed)
 Called ans spoke with pt  regarding upcoming appts

## 2023-11-15 ENCOUNTER — Encounter: Payer: Self-pay | Admitting: *Deleted

## 2023-11-15 ENCOUNTER — Inpatient Hospital Stay: Attending: Hematology | Admitting: Hematology

## 2023-11-15 DIAGNOSIS — C50511 Malignant neoplasm of lower-outer quadrant of right female breast: Secondary | ICD-10-CM

## 2023-11-15 DIAGNOSIS — Z170 Estrogen receptor positive status [ER+]: Secondary | ICD-10-CM

## 2023-11-15 NOTE — Progress Notes (Signed)
 [facility]   [phone] [phone]   Clinic Follow up Note   Patient Care Team: [name], Laine LABOR, MD as PCP - General Jenel Carlin POUR, MD (Inactive) as Consulting Physician (Neurology) Perla Evalene PARAS, MD as Consulting Physician (Cardiology) Roz Anes, MD as Consulting Physician (Ophthalmology) Fate Morna SAILOR, [facility] (Inactive) as Pharmacist (Pharmacist) Tyree Nanetta SAILOR, RN as Oncology Nurse Navigator Gerome, Devere HERO, RN as Oncology Nurse Navigator Curvin Deward MOULD, MD as Consulting Physician (General Surgery) Lanny Callander, MD as Consulting Physician (Hematology) Shannon Agent, MD as Consulting Physician (Radiation Oncology) 11/15/2023  I connected with Karina [name] on 11/15/23 at  8:30 AM EDT by telephone and verified that I am speaking with the correct person using two identifiers.   I discussed the limitations, risks, security and privacy concerns of performing an evaluation and management service by telephone and the availability of in person appointments. I also discussed with the patient that there may be a patient responsible charge related to this service. The patient expressed understanding and agreed to proceed.   Patient's location:  Home  Provider's location:  Office    CHIEF COMPLAINT: f/u breast cancer    CURRENT THERAPY: pending surgery   Oncology history Malignant neoplasm of lower-outer quadrant of right breast of female, estrogen receptor positive (HCC) invasive lobular carcinoma of right breast, ER/PR positive, HER2 negative, cT1cN0M0, stage IA -Invasive lobular carcinoma of the right breast, measuring 1.3 cm, ER/PR positive, HER2 negative, stage 1, located behind the nipple. the mass measured 1.8cm on MRI -breast MRI showed indeterminate 1.6 cm non-mass enhancement in the upper RIGHT breast at middle depth at the 12 o'clock location, biopsy was benign.  -she will proceed with surgery next   Assessment &  Plan Malignant neoplasm of lower-outer quadrant of right breast The second biopsy of the breast lesion was benign. MRI indicates the tumor size is slightly larger than the ultrasound, but remains very small. - Proceed with lumpectomy and sentinel lymph node biopsy. - Schedule follow-up appointment post-surgery.     SUMMARY OF ONCOLOGIC HISTORY: Oncology History  Malignant neoplasm of lower-outer quadrant of right breast of female, estrogen receptor positive (HCC)  10/07/2023 Cancer Staging   Staging form: Breast, AJCC 8th Edition - Clinical stage from 10/07/2023: Stage IA (cT1c, cN0, cM0, G1, ER+, PR+, HER2-) - Signed by Lanny Callander, MD on 10/18/2023 Stage prefix: Initial diagnosis Histologic grading system: 3 grade system   10/17/2023 Initial Diagnosis   Malignant neoplasm of lower-outer quadrant of right breast of female, estrogen receptor positive (HCC)     Discussed the use of AI scribe software for clinical note transcription with the patient, who gave verbal consent to proceed.  History of Present Illness Karina [name] is a 68 year old female with breast cancer who presents for a follow-up visit.  She is awaiting confirmation of her surgery date. The recent biopsy was benign. The site of her first biopsy is healing well after a prolonged process.     REVIEW OF SYSTEMS:   Constitutional: Denies fevers, chills or abnormal weight loss Eyes: Denies blurriness of vision Ears, nose, mouth, throat, and face: Denies mucositis or sore throat Respiratory: Denies cough, dyspnea or wheezes Cardiovascular: Denies palpitation, chest discomfort or lower extremity swelling Gastrointestinal:  Denies nausea, heartburn or change in bowel habits Skin: Denies abnormal skin rashes Lymphatics: Denies new lymphadenopathy or easy bruising Neurological:Denies numbness, tingling or new weaknesses Behavioral/Psych: Mood is stable, no new changes  All other  systems were reviewed with the patient and  are negative.  MEDICAL HISTORY:  Past Medical History:  Diagnosis Date   Allergic rhinitis    Allergy    Anxiety    [place]- Slater Darner, no per pt   Cataract    Colon polyps 08/2008   Adenomatous   COPD (chronic obstructive pulmonary disease) (HCC)    Depression    no per pt   Diabetes mellitus    Gastritis 08/2008   H. pylori   GERD (gastroesophageal reflux disease)    Hypercholesteremia    Hypercholesteremia    Lung nodule    Menopausal disorder    Multiple sclerosis (HCC)    Neuromuscular disorder (HCC)    Neuropathy    Plantar fasciitis    Seizures (HCC)    1 seizure in 2001 due to blood sugar dropping to 38, none since   Shingles    Left V1   Ulcerative colitis (HCC)    Urinary incontinence    Vertigo     SURGICAL HISTORY: Past Surgical History:  Procedure Laterality Date   BREAST BIOPSY Right 10/07/2023   US  RT BREAST BX W LOC DEV 1ST LESION IMG BX SPEC US  GUIDE 10/07/2023 [facility] MAMMOGRAPHY   CATARACT EXTRACTION Bilateral 2023   CERVICAL DISCECTOMY  03/08/2002   x 2, Anterior; Fusion C4-5, C5-6, C6-7, Dr. Arley Helling   CHEST CT  11/06/2008   With small 4 mm nodule L lung base (rec re check in 1 year)   CHEST CT  07/06/2009   Re check chest CT - lung nodule stable   CHOLECYSTECTOMY     COLONOSCOPY  08/06/2008   Polyps/ re check 5 yrs   CT SINUS LTD W/O CM  02/05/2009   negative   DILATION AND CURETTAGE OF UTERUS     ESOPHAGOGASTRODUODENOSCOPY  08/06/2008   Erosive gastritis, h pylori (treated)   FOOT SURGERY     left plantar fascial problem   ROTATOR CUFF REPAIR     right   TONSILLECTOMY     TUBAL LIGATION     UPPER GASTROINTESTINAL ENDOSCOPY      I have reviewed the social history and family history with the patient and they are unchanged from previous note.  ALLERGIES:  is allergic to atorvastatin, percocet [oxycodone-acetaminophen ], tecfidera [dimethyl fumarate], amitriptyline hcl, betaseron [interferon beta-1b], duloxetine, pregabalin, zoloft   [sertraline  hcl], crestor  [rosuvastatin ], clarithromycin, levofloxacin, and pseudoephedrine.  MEDICATIONS:  Current Outpatient Medications  Medication Sig Dispense Refill   albuterol  (VENTOLIN  HFA) 108 (90 Base) MCG/ACT inhaler INHALE 2 PUFFS UP TO EVERY 4 HOURS AS NEEDED FOR WHEEZING 3 each 3   ALPRAZolam  (XANAX ) 0.5 MG tablet Take 1 tablet (0.5 mg total) by mouth 2 (two) times daily as needed. For anxiety. 30 tablet 0   aspirin  EC 81 MG tablet Take 1 tablet (81 mg total) by mouth daily. Swallow whole. 90 tablet 3   budesonide -formoterol  (SYMBICORT ) 160-4.5 MCG/ACT inhaler Inhale 2 puffs into the lungs 2 (two) times daily. (Patient taking differently: Inhale 2 puffs into the lungs 2 (two) times daily as needed.) 3 each 1   cholecalciferol (VITAMIN D3) 25 MCG (1000 UNIT) tablet Take 1,000 Units by mouth daily.     famotidine  (PEPCID ) 20 MG tablet TAKE 1 TABLET (20 MG TOTAL) BY MOUTH DAILY. TAKE BEFORE DINNER 90 tablet 2   fluticasone  (FLONASE ) 50 MCG/ACT nasal spray Place 2 sprays into both nostrils daily. (Patient taking differently: Place 2 sprays into both nostrils daily as needed.) 16 g  11   gabapentin  (NEURONTIN ) 300 MG capsule TAKE 2 CAPSULES BY MOUTH AT BEDTIME. 180 capsule 1   ipratropium-albuterol  (DUONEB) 0.5-2.5 (3) MG/3ML SOLN TAKE 3 MLS BY NEBULIZATION EVERY 6 (SIX) HOURS AS NEEDED. 360 mL 0   methylcellulose (CITRUCEL) oral powder 1 tsp daily     Multiple Vitamin (MULTIVITAMIN) tablet Take 1 tablet by mouth daily.     nicotine (NICODERM CQ - DOSED IN MG/24 HOURS) 21 mg/24hr patch Place 21 mg onto the skin daily.     pantoprazole  (PROTONIX ) 40 MG tablet Take 1 tablet (40 mg total) by mouth daily before breakfast.     rosuvastatin  (CRESTOR ) 10 MG tablet Take 1 tablet (10 mg total) by mouth daily. 90 tablet 3   Spacer/Aero-Holding Chambers (AEROCHAMBER MV) inhaler Use as instructed 1 each 0   No current facility-administered medications for this visit.    PHYSICAL EXAMINATION: Not  performed   LABORATORY DATA:  I have reviewed the data as listed    Latest Ref Rng & Units 10/19/2023   12:30 PM 07/07/2023   11:46 AM 02/22/2023    8:29 AM  CBC  WBC 4.0 - 10.5 K/uL 7.3  7.2  7.2   Hemoglobin 12.0 - 15.0 g/dL 85.6  84.7  85.6   Hematocrit 36.0 - 46.0 % 43.7  46.8  43.9   Platelets 150 - 400 K/uL 242  235  241.0         Latest Ref Rng & Units 10/19/2023   12:30 PM 07/07/2023   11:46 AM 02/22/2023    8:29 AM  CMP  Glucose 70 - 99 mg/dL 89  75  893   BUN 8 - 23 mg/dL 10  10  9    Creatinine 0.44 - 1.00 mg/dL 9.01  8.84  8.89   Sodium 135 - 145 mmol/L 138  139  139   Potassium 3.5 - 5.1 mmol/L 4.7  5.2  4.4   Chloride 98 - 111 mmol/L 101  100  99   CO2 22 - 32 mmol/L 33  26  34   Calcium  8.9 - 10.3 mg/dL 9.7  9.9  9.6   Total Protein 6.5 - 8.1 g/dL 7.3  7.3  6.7   Total Bilirubin 0.0 - 1.2 mg/dL 0.4  0.3  0.6   Alkaline Phos 38 - 126 U/L 77  87  76   AST 15 - 41 U/L 16  18  16    ALT 0 - 44 U/L 12  13  12        RADIOGRAPHIC STUDIES: I have personally reviewed the radiological images as listed and agreed with the findings in the report. No results found.     I discussed the assessment and treatment plan with the patient. The patient was provided an opportunity to ask questions and all were answered. The patient agreed with the plan and demonstrated an understanding of the instructions.   The patient was advised to call back or seek an in-person evaluation if the symptoms worsen or if the condition fails to improve as anticipated.  I provided 3 minutes of non face-to-face telephone visit time during this encounter, including review of chart and various tests results, discussions about plan of care and coordination of care plan.    Onita Mattock, MD 11/15/23

## 2023-11-15 NOTE — Assessment & Plan Note (Signed)
 invasive lobular carcinoma of right breast, ER/PR positive, HER2 negative, cT1cN0M0, stage IA -Invasive lobular carcinoma of the right breast, measuring 1.3 cm, ER/PR positive, HER2 negative, stage 1, located behind the nipple. the mass measured 1.8cm on MRI -breast MRI showed indeterminate 1.6 cm non-mass enhancement in the upper RIGHT breast at middle depth at the 12 o'clock location, biopsy was benign.  -she will proceed with surgery next

## 2023-11-17 ENCOUNTER — Encounter: Payer: Self-pay | Admitting: General Practice

## 2023-11-17 NOTE — Progress Notes (Signed)
 [facility] Spiritual Care Note  Followed up with Karina [name] for continued spiritual and emotional support. She reports that she is doing ok overall, though she has two troublesome physical complaints (shoulder pain since first MRI and low-back pain with high activity levels) and one significant ongoing medical concern: She continues to wonder whether chemo and/or radiation will be necessary treatment(s) to put her already-taxed body through, or whether surgery alone will reduce her risk enough. Risk stratification for local recurrence vs metastatic recurrence, if such is possible, would help her discernment process; she states that is interested to receive oncotype information toward this end.  Provided reflective listening, encouragement to continue health conversations with her providers, and emotional support. We plan to follow up by phone after her breast surgery for another Spiritual Care check-in.  [address] Karina [name], [place], [facility] Pager [phone] Voicemail [phone]

## 2023-11-18 ENCOUNTER — Ambulatory Visit: Payer: Self-pay | Admitting: Rehabilitation

## 2023-11-21 ENCOUNTER — Other Ambulatory Visit: Payer: Self-pay

## 2023-11-21 ENCOUNTER — Encounter (HOSPITAL_BASED_OUTPATIENT_CLINIC_OR_DEPARTMENT_OTHER): Payer: Self-pay | Admitting: General Surgery

## 2023-11-24 ENCOUNTER — Other Ambulatory Visit: Payer: Self-pay | Admitting: General Surgery

## 2023-11-24 DIAGNOSIS — Z170 Estrogen receptor positive status [ER+]: Secondary | ICD-10-CM

## 2023-11-25 ENCOUNTER — Encounter (HOSPITAL_BASED_OUTPATIENT_CLINIC_OR_DEPARTMENT_OTHER): Payer: Self-pay | Admitting: General Surgery

## 2023-11-25 ENCOUNTER — Ambulatory Visit (HOSPITAL_BASED_OUTPATIENT_CLINIC_OR_DEPARTMENT_OTHER)
Admission: RE | Admit: 2023-11-25 | Discharge: 2023-11-25 | Disposition: A | Discharge status: Home / Self Care | Attending: General Surgery | Admitting: General Surgery

## 2023-11-25 ENCOUNTER — Encounter

## 2023-11-25 ENCOUNTER — Other Ambulatory Visit: Payer: Self-pay

## 2023-11-25 ENCOUNTER — Ambulatory Visit (HOSPITAL_COMMUNITY)
Admission: RE | Admit: 2023-11-25 | Discharge: 2023-11-25 | Disposition: A | Discharge status: Home / Self Care | Source: Ambulatory Visit | Attending: General Surgery | Admitting: General Surgery

## 2023-11-25 ENCOUNTER — Ambulatory Visit (HOSPITAL_BASED_OUTPATIENT_CLINIC_OR_DEPARTMENT_OTHER): Admitting: Anesthesiology

## 2023-11-25 ENCOUNTER — Encounter (HOSPITAL_BASED_OUTPATIENT_CLINIC_OR_DEPARTMENT_OTHER)
Admission: RE | Disposition: A | Discharge status: Home / Self Care | Payer: Self-pay | Source: Home / Self Care | Attending: General Surgery

## 2023-11-25 ENCOUNTER — Ambulatory Visit
Admission: RE | Admit: 2023-11-25 | Discharge: 2023-11-25 | Disposition: A | Discharge status: Home / Self Care | Source: Ambulatory Visit | Attending: General Surgery | Admitting: General Surgery

## 2023-11-25 ENCOUNTER — Ambulatory Visit (HOSPITAL_COMMUNITY)

## 2023-11-25 DIAGNOSIS — J449 Chronic obstructive pulmonary disease, unspecified: Secondary | ICD-10-CM | POA: Insufficient documentation

## 2023-11-25 DIAGNOSIS — Z170 Estrogen receptor positive status [ER+]: Secondary | ICD-10-CM

## 2023-11-25 DIAGNOSIS — Z1732 Human epidermal growth factor receptor 2 negative status: Secondary | ICD-10-CM | POA: Diagnosis not present

## 2023-11-25 DIAGNOSIS — I2510 Atherosclerotic heart disease of native coronary artery without angina pectoris: Secondary | ICD-10-CM | POA: Insufficient documentation

## 2023-11-25 DIAGNOSIS — Z79899 Other long term (current) drug therapy: Secondary | ICD-10-CM | POA: Insufficient documentation

## 2023-11-25 DIAGNOSIS — C50111 Malignant neoplasm of central portion of right female breast: Secondary | ICD-10-CM | POA: Insufficient documentation

## 2023-11-25 DIAGNOSIS — K219 Gastro-esophageal reflux disease without esophagitis: Secondary | ICD-10-CM | POA: Insufficient documentation

## 2023-11-25 DIAGNOSIS — Z87891 Personal history of nicotine dependence: Secondary | ICD-10-CM | POA: Diagnosis not present

## 2023-11-25 DIAGNOSIS — C773 Secondary and unspecified malignant neoplasm of axilla and upper limb lymph nodes: Secondary | ICD-10-CM | POA: Diagnosis not present

## 2023-11-25 DIAGNOSIS — G35 Multiple sclerosis: Secondary | ICD-10-CM | POA: Diagnosis not present

## 2023-11-25 DIAGNOSIS — G8918 Other acute postprocedural pain: Secondary | ICD-10-CM | POA: Diagnosis not present

## 2023-11-25 DIAGNOSIS — Z01818 Encounter for other preprocedural examination: Secondary | ICD-10-CM

## 2023-11-25 DIAGNOSIS — Z1721 Progesterone receptor positive status: Secondary | ICD-10-CM | POA: Diagnosis not present

## 2023-11-25 DIAGNOSIS — E119 Type 2 diabetes mellitus without complications: Secondary | ICD-10-CM | POA: Insufficient documentation

## 2023-11-25 DIAGNOSIS — C50911 Malignant neoplasm of unspecified site of right female breast: Secondary | ICD-10-CM | POA: Diagnosis not present

## 2023-11-25 DIAGNOSIS — C50011 Malignant neoplasm of nipple and areola, right female breast: Secondary | ICD-10-CM | POA: Diagnosis not present

## 2023-11-25 HISTORY — DX: Unspecified asthma, uncomplicated: J45.909

## 2023-11-25 HISTORY — PX: BREAST LUMPECTOMY WITH RADIOACTIVE SEED AND SENTINEL LYMPH NODE BIOPSY: SHX6550

## 2023-11-25 HISTORY — DX: Other complications of anesthesia, initial encounter: T88.59XA

## 2023-11-25 HISTORY — PX: BREAST BIOPSY: SHX20

## 2023-11-25 SURGERY — BREAST LUMPECTOMY WITH RADIOACTIVE SEED AND SENTINEL LYMPH NODE BIOPSY
Anesthesia: General | Site: Breast | Laterality: Right

## 2023-11-25 MED ORDER — MIDAZOLAM HCL 2 MG/2ML IJ SOLN: 0.5000 mg | Freq: Once | INTRAMUSCULAR | Status: DC | PRN

## 2023-11-25 MED ORDER — DEXAMETHASONE SODIUM PHOSPHATE 10 MG/ML IJ SOLN
INTRAMUSCULAR | Status: AC
  Filled 2023-11-25: qty 1

## 2023-11-25 MED ORDER — FENTANYL CITRATE (PF) 100 MCG/2ML IJ SOLN
INTRAMUSCULAR | Status: DC | PRN
  Administered 2023-11-25: 100 ug via INTRAVENOUS

## 2023-11-25 MED ORDER — CHLORHEXIDINE GLUCONATE CLOTH 2 % EX PADS: 6.0000 | MEDICATED_PAD | Freq: Once | CUTANEOUS | Status: DC

## 2023-11-25 MED ORDER — TRAMADOL HCL 50 MG PO TABS
50.0000 mg | ORAL_TABLET | Freq: Once | ORAL | Status: AC
  Administered 2023-11-25: 50 mg via ORAL

## 2023-11-25 MED ORDER — PROPOFOL 10 MG/ML IV BOLUS
INTRAVENOUS | Status: AC
  Filled 2023-11-25: qty 20

## 2023-11-25 MED ORDER — HYDROMORPHONE HCL 1 MG/ML IJ SOLN: 0.2500 mg | INTRAMUSCULAR | Status: DC | PRN

## 2023-11-25 MED ORDER — ONDANSETRON HCL 4 MG/2ML IJ SOLN
INTRAMUSCULAR | Status: DC | PRN
  Administered 2023-11-25: 4 mg via INTRAVENOUS

## 2023-11-25 MED ORDER — 0.9 % SODIUM CHLORIDE (POUR BTL) OPTIME
TOPICAL | Status: DC | PRN
  Administered 2023-11-25: 50 mL

## 2023-11-25 MED ORDER — SCOPOLAMINE 1 MG/3DAYS TD PT72
1.0000 | MEDICATED_PATCH | TRANSDERMAL | Status: DC
  Administered 2023-11-25: 1 mg via TRANSDERMAL

## 2023-11-25 MED ORDER — ALBUTEROL SULFATE (2.5 MG/3ML) 0.083% IN NEBU: 2.5000 mg | INHALATION_SOLUTION | Freq: Once | RESPIRATORY_TRACT | Status: AC

## 2023-11-25 MED ORDER — CEFAZOLIN SODIUM-DEXTROSE 2-4 GM/100ML-% IV SOLN
2.0000 g | INTRAVENOUS | Status: AC
Start: 2023-11-25 — End: 2023-11-25
  Administered 2023-11-25: 2 g via INTRAVENOUS

## 2023-11-25 MED ORDER — MIDAZOLAM HCL 2 MG/2ML IJ SOLN
2.0000 mg | Freq: Once | INTRAMUSCULAR | Status: AC
  Administered 2023-11-25: 1 mg via INTRAVENOUS

## 2023-11-25 MED ORDER — EPHEDRINE SULFATE (PRESSORS) 50 MG/ML IJ SOLN
INTRAMUSCULAR | Status: DC | PRN
  Administered 2023-11-25 (×2): 10 mg via INTRAVENOUS
  Administered 2023-11-25: 5 mg via INTRAVENOUS

## 2023-11-25 MED ORDER — ALBUTEROL SULFATE (2.5 MG/3ML) 0.083% IN NEBU
2.5000 mg | INHALATION_SOLUTION | Freq: Four times a day (QID) | RESPIRATORY_TRACT | Status: DC | PRN
  Administered 2023-11-25: 2.5 mg via RESPIRATORY_TRACT

## 2023-11-25 MED ORDER — GABAPENTIN 300 MG PO CAPS
ORAL_CAPSULE | ORAL | Status: AC
  Filled 2023-11-25: qty 1

## 2023-11-25 MED ORDER — PHENYLEPHRINE 80 MCG/ML (10ML) SYRINGE FOR IV PUSH (FOR BLOOD PRESSURE SUPPORT)
PREFILLED_SYRINGE | INTRAVENOUS | Status: AC
  Filled 2023-11-25: qty 10

## 2023-11-25 MED ORDER — LIDOCAINE 2% (20 MG/ML) 5 ML SYRINGE
INTRAMUSCULAR | Status: AC
  Filled 2023-11-25: qty 5

## 2023-11-25 MED ORDER — LACTATED RINGERS IV SOLN
INTRAVENOUS | Status: DC
Start: 2023-11-25 — End: 2023-11-25

## 2023-11-25 MED ORDER — ALBUTEROL SULFATE (2.5 MG/3ML) 0.083% IN NEBU
INHALATION_SOLUTION | RESPIRATORY_TRACT | Status: AC
  Filled 2023-11-25: qty 3

## 2023-11-25 MED ORDER — BUPIVACAINE-EPINEPHRINE 0.25% -1:200000 IJ SOLN
INTRAMUSCULAR | Status: DC | PRN
  Administered 2023-11-25: 30 mL

## 2023-11-25 MED ORDER — DEXAMETHASONE SODIUM PHOSPHATE 10 MG/ML IJ SOLN
INTRAMUSCULAR | Status: DC | PRN
  Administered 2023-11-25: 5 mg via INTRAVENOUS

## 2023-11-25 MED ORDER — FENTANYL CITRATE (PF) 100 MCG/2ML IJ SOLN
INTRAMUSCULAR | Status: AC
  Filled 2023-11-25: qty 2

## 2023-11-25 MED ORDER — PHENYLEPHRINE HCL (PRESSORS) 10 MG/ML IV SOLN
INTRAVENOUS | Status: DC | PRN
  Administered 2023-11-25: 40 ug via INTRAVENOUS
  Administered 2023-11-25: 80 ug via INTRAVENOUS

## 2023-11-25 MED ORDER — SUCCINYLCHOLINE CHLORIDE 200 MG/10ML IV SOSY
PREFILLED_SYRINGE | INTRAVENOUS | Status: DC | PRN
  Administered 2023-11-25: 100 mg via INTRAVENOUS

## 2023-11-25 MED ORDER — PHENYLEPHRINE HCL (PRESSORS) 10 MG/ML IV SOLN: INTRAVENOUS | Status: DC | PRN

## 2023-11-25 MED ORDER — ONDANSETRON HCL 4 MG/2ML IJ SOLN
INTRAMUSCULAR | Status: AC
  Filled 2023-11-25: qty 2

## 2023-11-25 MED ORDER — ACETAMINOPHEN 500 MG PO TABS
ORAL_TABLET | ORAL | Status: AC
  Filled 2023-11-25: qty 2

## 2023-11-25 MED ORDER — BUPIVACAINE-EPINEPHRINE (PF) 0.5% -1:200000 IJ SOLN
INTRAMUSCULAR | Status: DC | PRN
Start: 2023-11-25 — End: 2023-11-25
  Administered 2023-11-25: 30 mL

## 2023-11-25 MED ORDER — PROPOFOL 10 MG/ML IV BOLUS
INTRAVENOUS | Status: DC | PRN
  Administered 2023-11-25: 120 mg via INTRAVENOUS

## 2023-11-25 MED ORDER — GABAPENTIN 100 MG PO CAPS
100.0000 mg | ORAL_CAPSULE | ORAL | Status: AC
Start: 2023-11-25 — End: 2023-11-25
  Administered 2023-11-25: 100 mg via ORAL

## 2023-11-25 MED ORDER — TECHNETIUM TC 99M TILMANOCEPT KIT
1.0000 | PACK | Freq: Once | INTRAVENOUS | Status: AC | PRN
Start: 2023-11-25 — End: 2023-11-25
  Administered 2023-11-25: 1.1 via INTRADERMAL

## 2023-11-25 MED ORDER — LIDOCAINE 2% (20 MG/ML) 5 ML SYRINGE
INTRAMUSCULAR | Status: DC | PRN
  Administered 2023-11-25: 20 mg via INTRAVENOUS

## 2023-11-25 MED ORDER — IPRATROPIUM-ALBUTEROL 0.5-2.5 (3) MG/3ML IN SOLN: 3.0000 mL | Freq: Once | RESPIRATORY_TRACT | Status: DC

## 2023-11-25 MED ORDER — TRAMADOL HCL 50 MG PO TABS
ORAL_TABLET | ORAL | Status: AC
  Filled 2023-11-25: qty 1

## 2023-11-25 MED ORDER — MIDAZOLAM HCL 2 MG/2ML IJ SOLN
INTRAMUSCULAR | Status: AC
  Filled 2023-11-25: qty 2

## 2023-11-25 MED ORDER — FENTANYL CITRATE (PF) 100 MCG/2ML IJ SOLN
100.0000 ug | Freq: Once | INTRAMUSCULAR | Status: AC
  Administered 2023-11-25: 100 ug via INTRAVENOUS

## 2023-11-25 MED ORDER — ACETAMINOPHEN 500 MG PO TABS
1000.0000 mg | ORAL_TABLET | Freq: Once | ORAL | Status: AC
  Administered 2023-11-25: 1000 mg via ORAL

## 2023-11-25 MED ORDER — ROCURONIUM BROMIDE 10 MG/ML (PF) SYRINGE
PREFILLED_SYRINGE | INTRAVENOUS | Status: AC
  Filled 2023-11-25: qty 20

## 2023-11-25 MED ORDER — GABAPENTIN 100 MG PO CAPS
ORAL_CAPSULE | ORAL | Status: AC
  Filled 2023-11-25: qty 1

## 2023-11-25 MED ORDER — SUCCINYLCHOLINE CHLORIDE 200 MG/10ML IV SOSY
PREFILLED_SYRINGE | INTRAVENOUS | Status: AC
  Filled 2023-11-25: qty 10

## 2023-11-25 MED ORDER — SCOPOLAMINE 1 MG/3DAYS TD PT72
MEDICATED_PATCH | TRANSDERMAL | Status: AC
  Filled 2023-11-25: qty 1

## 2023-11-25 MED ORDER — TRAMADOL HCL 50 MG PO TABS: 50.0000 mg | ORAL_TABLET | Freq: Four times a day (QID) | ORAL | 0 refills | Status: DC | PRN

## 2023-11-25 MED ORDER — CEFAZOLIN SODIUM-DEXTROSE 2-4 GM/100ML-% IV SOLN
INTRAVENOUS | Status: AC
Start: 2023-11-25 — End: 2023-11-25
  Filled 2023-11-25: qty 100

## 2023-11-25 SURGICAL SUPPLY — 34 items
BLADE SURG 15 STRL LF DISP TIS (BLADE) ×1 IMPLANT
CANISTER SUC SOCK COL 7IN (MISCELLANEOUS) IMPLANT
CANISTER SUCT 1200ML W/VALVE (MISCELLANEOUS) IMPLANT
CHLORAPREP W/TINT 26 (MISCELLANEOUS) ×1 IMPLANT
CLIP APPLIE 9.375 MED OPEN (MISCELLANEOUS) ×1 IMPLANT
COVER BACK TABLE 60X90IN (DRAPES) ×1 IMPLANT
COVER MAYO STAND STRL (DRAPES) ×1 IMPLANT
COVER PROBE CYLINDRICAL 5X96 (MISCELLANEOUS) ×1 IMPLANT
DERMABOND ADVANCED .7 DNX12 (GAUZE/BANDAGES/DRESSINGS) ×1 IMPLANT
DRAPE LAPAROSCOPIC ABDOMINAL (DRAPES) ×1 IMPLANT
DRAPE UTILITY XL STRL (DRAPES) ×1 IMPLANT
ELECT COATED BLADE 2.86 ST (ELECTRODE) ×1 IMPLANT
ELECTRODE REM PT RTRN 9FT ADLT (ELECTROSURGICAL) ×1 IMPLANT
GLOVE BIO SURGEON STRL SZ7.5 (GLOVE) ×1 IMPLANT
GOWN STRL REUS W/ TWL LRG LVL3 (GOWN DISPOSABLE) ×2 IMPLANT
GOWN STRL REUS W/ TWL XL LVL3 (GOWN DISPOSABLE) IMPLANT
KIT MARKER MARGIN INK (KITS) ×1 IMPLANT
NDL HYPO 25X1 1.5 SAFETY (NEEDLE) ×1 IMPLANT
NDL SAFETY ECLIPSE 18X1.5 (NEEDLE) IMPLANT
NEEDLE HYPO 25X1 1.5 SAFETY (NEEDLE) ×1 IMPLANT
NS IRRIG 1000ML POUR BTL (IV SOLUTION) IMPLANT
PACK BASIN DAY SURGERY FS (CUSTOM PROCEDURE TRAY) ×1 IMPLANT
PENCIL SMOKE EVACUATOR (MISCELLANEOUS) ×1 IMPLANT
SLEEVE SCD COMPRESS KNEE MED (STOCKING) ×1 IMPLANT
SPIKE FLUID TRANSFER (MISCELLANEOUS) IMPLANT
SPONGE T-LAP 18X18 ~~LOC~~+RFID (SPONGE) ×1 IMPLANT
SUT MON AB 4-0 PC3 18 (SUTURE) ×2 IMPLANT
SUT SILK 2 0 SH (SUTURE) IMPLANT
SUT VICRYL 3-0 CR8 SH (SUTURE) ×1 IMPLANT
SYR CONTROL 10ML LL (SYRINGE) ×1 IMPLANT
TOWEL GREEN STERILE FF (TOWEL DISPOSABLE) ×1 IMPLANT
TRAY FAXITRON CT DISP (TRAY / TRAY PROCEDURE) ×1 IMPLANT
TUBE CONNECTING 20X1/4 (TUBING) IMPLANT
YANKAUER SUCT BULB TIP NO VENT (SUCTIONS) IMPLANT

## 2023-11-25 NOTE — Anesthesia Preprocedure Evaluation (Addendum)
 Anesthesia Evaluation  Patient identified by MRN, date of birth, ID band Patient awake    Reviewed: Allergy & Precautions, NPO status , Patient's Chart, lab work & pertinent test results  History of Anesthesia Complications Negative for: history of anesthetic complications  Airway Mallampati: II  TM Distance: >3 FB Neck ROM: Full    Dental  (+) Dental Advisory Given   Pulmonary COPD,  COPD inhaler, Patient abstained from smoking., former smoker   breath sounds clear to auscultation       Cardiovascular (-) angina + CAD (calcification on CT)   Rhythm:Regular Rate:Normal  '17 stress: Normal wall motion, EF estimated at 65% No EKG changes concerning for ischemia at peak stress or in recovery. Low risk scan    Neuro/Psych  Headaches  Anxiety Depression    Vertigo Multiple sclerosis    GI/Hepatic Neg liver ROS, PUD,GERD  Medicated and Poorly Controlled,,  Endo/Other  diabetes (diet controlled)  BMI 35  Renal/GU negative Renal ROS     Musculoskeletal   Abdominal   Peds  Hematology negative hematology ROS (+)   Anesthesia Other Findings Breast cancer  Reproductive/Obstetrics                              Anesthesia Physical Anesthesia Plan  ASA: 3  Anesthesia Plan: General   Post-op Pain Management: Tylenol  PO (pre-op)* and Regional block*   Induction: Intravenous  PONV Risk Score and Plan: 3 and Ondansetron , Dexamethasone  and Scopolamine  patch - Pre-op  Airway Management Planned: Oral ETT  Additional Equipment: None  Intra-op Plan:   Post-operative Plan: Extubation in OR  Informed Consent: I have reviewed the patients History and Physical, chart, labs and discussed the procedure including the risks, benefits and alternatives for the proposed anesthesia with the patient or authorized representative who has indicated his/her understanding and acceptance.     Dental advisory  given  Plan Discussed with: CRNA and Surgeon  Anesthesia Plan Comments: (Plan GA with pectoralis block for post op analgesia ETT: poorly controlled GERD)         Anesthesia Quick Evaluation

## 2023-11-25 NOTE — Op Note (Signed)
 11/25/2023  1:32 PM  PATIENT:  Karina [name]  68 y.o. female  PRE-OPERATIVE DIAGNOSIS:  RIGHT BREAST CANCER  POST-OPERATIVE DIAGNOSIS:  RIGHT BREAST CANCER  PROCEDURE:  Procedure(s) with comments: RIGHT BREAST RADIOACTIVE SEED LOCALIZED CENTRAL LUMPECTOMY AND DEEP RIGHT AXILLARY SENTINEL LYMPH NODE BIOPSY  SURGEON:  Surgeons and Role:    * Curvin Deward MOULD, MD - Primary  PHYSICIAN ASSISTANT:   ASSISTANTS: none   ANESTHESIA:   local and general  EBL:  10 mL   BLOOD ADMINISTERED:none  DRAINS: none   LOCAL MEDICATIONS USED:  MARCAINE      SPECIMEN:  Source of Specimen:  right breast tissue and sentinel node  DISPOSITION OF SPECIMEN:  PATHOLOGY  COUNTS:  YES  TOURNIQUET:  * No tourniquets in log *  DICTATION: .Dragon Dictation  After informed consent was obtained the patient was brought to the operating room and placed in the supine position on the operating table.  After adequate induction of general anesthesia the patient's right chest, breast, and axillary area were prepped with ChloraPrep, allowed to dry, and draped in usual sterile manner.  An appropriate timeout was performed.  Previously an I-125 seed was placed in the central retroareolar right breast to mark the posterior extent of the invasive breast cancer.  Also earlier in the day the patient underwent injection of 1 mCi of technetium sulfur colloid in the subareolar position on the right.  Attention was first turned to the axilla.  The neoprobe was set to technetium and a good signal was identified.  The area overlying this was infiltrated with quarter percent Marcaine .  A small transversely oriented incision was made with a 15 blade knife overlying the area of radioactivity.  The incision was carried through the skin and subcutaneous tissue sharply with the electrocautery until the deep right axillary space was entered.  The neoprobe was used to direct blunt hemostat dissection.  I was able to identify the lymph node  with signal.  The lymph node was excised sharply with the electrocautery and the surrounding small vessels and lymphatics were controlled with clips.  Ex vivo counts on this node were approximately 5000.  No other hot or palpable nodes were identified in the right axilla.  Hemostasis was achieved using the Bovie electrocautery.  The deep layer of the axilla was then closed with interrupted 3-0 Vicryl stitches.  The skin was closed with a running 4-0 Monocryl subcuticular stitch.  Attention was then turned to the right breast.  The neoprobe was set to I-125 in the area of radioactivity was readily identified behind the nipple.  An elliptical incision was made around the nipple areolar complex with a 15 blade knife.  The incision was carried through the skin and subcutaneous tissue sharply with the electrocautery.  Dissection was then carried widely around the nipple and areolar complex to incorporate the posterior extent of the seed.  Once this dissection was complete the tissue was removed and oriented with the appropriate painkillers.  A specimen radiograph was obtained that showed the clip and seed to be in the center of the specimen.  The specimen was then sent to pathology for further evaluation.  Hemostasis was achieved using the Bovie electrocautery.  The wound was irrigated with saline and infiltrated with more quarter percent Marcaine .  The cavity was marked with clips.  The deep layer of the incision was closed with layers of interrupted 3-0 Vicryl stitches.  The skin was closed with a running 4-0 Monocryl subcuticular stitch.  Dermabond dressings were applied.  The patient tolerated the procedure well.  At the end of the case all needle sponge and instrument counts were correct.  The patient was then awakened and taken recovery in stable condition.  PLAN OF CARE: Discharge to home after PACU  PATIENT DISPOSITION:  PACU - hemodynamically stable.   Delay start of Pharmacological VTE agent (>24hrs) due  to surgical blood loss or risk of bleeding: not applicable

## 2023-11-25 NOTE — Anesthesia Procedure Notes (Signed)
 Procedure Name: Intubation Date/Time: 11/25/2023 12:32 PM  Performed by: Debarah Chiquita LABOR, CRNAPre-anesthesia Checklist: Patient identified, Emergency Drugs available, Suction available and Patient being monitored Patient Re-evaluated:Patient Re-evaluated prior to induction Oxygen Delivery Method: Circle system utilized Preoxygenation: Pre-oxygenation with 100% oxygen Induction Type: IV induction, Rapid sequence and Cricoid Pressure applied Ventilation: Mask ventilation without difficulty Laryngoscope Size: Mac and 3 Grade View: Grade I Tube type: Oral Tube size: 7.0 mm Number of attempts: 1 Airway Equipment and Method: Stylet and Bite block Placement Confirmation: ETT inserted through vocal cords under direct vision, positive ETCO2 and breath sounds checked- equal and bilateral Secured at: 21 cm Tube secured with: Tape Dental Injury: Teeth and Oropharynx as per pre-operative assessment

## 2023-11-25 NOTE — Transfer of Care (Signed)
 Immediate Anesthesia Transfer of Care Note  Patient: Karina [name]  Procedure(s) Performed: BREAST LUMPECTOMY WITH RADIOACTIVE SEED AND SENTINEL LYMPH NODE BIOPSY (Right: Breast)  Patient Location: PACU  Anesthesia Type:GA combined with regional for post-op pain  Level of Consciousness: drowsy  Airway & Oxygen Therapy: Patient Spontanous Breathing and Patient connected to face mask oxygen  Post-op Assessment: Report given to RN and Post -op Vital signs reviewed and stable  Post vital signs: Reviewed and stable  Last Vitals:  Vitals Value Taken Time  BP 138/67 11/25/23 13:41  Temp    Pulse 81 11/25/23 13:43  Resp 18 11/25/23 13:43  SpO2 99 % 11/25/23 13:43  Vitals shown include unfiled device data.  Last Pain:  Vitals:   11/25/23 1047  TempSrc: Temporal  PainSc: 0-No pain      Patients Stated Pain Goal: 3 (11/25/23 1047)  Complications: No notable events documented.

## 2023-11-25 NOTE — Discharge Instructions (Addendum)
*  You had 1000 mg of Tylenol  at 10:52am   Post Anesthesia Home Care Instructions  Activity: Get plenty of rest for the remainder of the day. A responsible individual must stay with you for 24 hours following the procedure.  For the next 24 hours, DO NOT: -Drive a car -Advertising copywriter -Drink alcoholic beverages -Take any medication unless instructed by your physician -Make any legal decisions or sign important papers.  Meals: Start with liquid foods such as gelatin or soup. Progress to regular foods as tolerated. Avoid greasy, spicy, heavy foods. If nausea and/or vomiting occur, drink only clear liquids until the nausea and/or vomiting subsides. Call your physician if vomiting continues.  Special Instructions/Symptoms: Your throat may feel dry or sore from the anesthesia or the breathing tube placed in your throat during surgery. If this causes discomfort, gargle with warm salt water. The discomfort should disappear within 24 hours.  If you had a scopolamine  patch placed behind your ear for the management of post- operative nausea and/or vomiting:  1. The medication in the patch is effective for 72 hours, after which it should be removed.  Wrap patch in a tissue and discard in the trash. Wash hands thoroughly with soap and water. 2. You may remove the patch earlier than 72 hours if you experience unpleasant side effects which may include dry mouth, dizziness or visual disturbances. 3. Avoid touching the patch. Wash your hands with soap and water after contact with the patch.

## 2023-11-25 NOTE — Anesthesia Postprocedure Evaluation (Signed)
 Anesthesia Post Note  Patient: Tiah M Fawbush  Procedure(s) Performed: BREAST LUMPECTOMY WITH RADIOACTIVE SEED AND SENTINEL LYMPH NODE BIOPSY (Right: Breast)     Patient location during evaluation: PACU Anesthesia Type: General Level of consciousness: awake and alert, oriented and patient cooperative Pain management: pain level controlled Vital Signs Assessment: post-procedure vital signs reviewed and stable Respiratory status: spontaneous breathing, nonlabored ventilation and respiratory function stable Cardiovascular status: blood pressure returned to baseline and stable Postop Assessment: no apparent nausea or vomiting, adequate PO intake and able to ambulate Anesthetic complications: no   No notable events documented.  Last Vitals:  Vitals:   11/25/23 1445 11/25/23 1515  BP:    Pulse:    Resp:    Temp:    SpO2: 92% 92%    Last Pain:  Vitals:   11/25/23 1415  TempSrc:   PainSc: 5                  Jeanene Mena,E. [place] Anagnos

## 2023-11-25 NOTE — Progress Notes (Signed)
Assisted Dr. Annye Asa with right, pectoralis, ultrasound guided block. Side rails up, monitors on throughout procedure. See vital signs in flow sheet. Tolerated Procedure well. ?

## 2023-11-25 NOTE — H&P (Signed)
 REFERRING PHYSICIAN: Lanny Callander, MD PROVIDER: DEWARD GARNETTE NULL, MD MRN: [id number] DOB: 1955/07/07 Subjective   Chief Complaint: Breast Cancer  History of Present Illness: Karina [name] is a 68 y.o. female who is seen today as an office consultation for evaluation of Breast Cancer  We are asked to see the patient in consultation by Dr. Lanny to evaluate her for a new right breast cancer. The patient is a 68 year old white female who first noticed right nipple inversion about 2 months ago with some tenderness. She went to her doctor and was evaluated with imaging. She was found to have a 1.3 cm retroareolar mass on the right. The axilla looked normal. 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 She does have MS and COPD. She quit smoking about 4 years ago.  Review of Systems: A complete review of systems was obtained from the patient. I have reviewed this information and discussed as appropriate with the patient. See HPI as well for other ROS.  ROS   Medical History: Past Medical History:  Diagnosis Date  Anxiety  COPD (chronic obstructive pulmonary disease) (CMS/HHS-HCC)  Diabetes mellitus without complication (CMS/HHS-HCC)  GERD (gastroesophageal reflux disease)  History of cancer  Hyperlipidemia  Seizures (CMS/HHS-HCC)   Patient Active Problem List  Diagnosis  Malignant neoplasm of central portion of right breast in female, estrogen receptor positive (CMS/HHS-HCC)   Past Surgical History:  Procedure Laterality Date  CATARACT EXTRACTION  cervical discectomy  CHOLECYSTECTOMY  DILATION AND CURETTAGE, DIAGNOSTIC / THERAPEUTIC  ESOPHAGOGASTRODUODENOSCOPY, FLEXIBLE, TRANSORAL FOR BARIATRIC BALLOON ADJUSTMENT  foot surgery  LAPAROSCOPIC TUBAL LIGATION  PERCUTANEOUS BIOPSY BREAST VACUUM ASSISTED W/NEEDLE LOCALIZATION  REPAIR ROTATOR CUFF TEAR CHRONIC OPEN    Allergies  Allergen  Reactions  Atorvastatin Other (See Comments)  REACTION: elevated LFT's  elevated LFT's  Dimethyl Fumarate Cough, Palpitations, Rash and Shortness Of Breath  Oxycodone-Acetaminophen  Shortness Of Breath  Felt like going to pass out and sweating  Tylenol  [Acetaminophen ] Shortness Of Breath  Amitriptyline Hcl Itching, Other (See Comments) and Swelling  REACTION: swelling and itching  Duloxetine Other (See Comments)  REACTION: pain  pain  Interferon Beta-1b Other (See Comments)  Headache  Pregabalin Other (See Comments)  REACTION: LE swelling  LE swelling  Sertraline  Hcl Other (See Comments)  Headaches  Rosuvastatin  Other (See Comments)  Muscle aches and cramps to generic crestor  and other statins  Clarithromycin Other (See Comments)  REACTION: reaction not known  reaction not known  Levofloxacin Other (See Comments) and Rash  REACTION: Rash  Pseudoephedrine Other (See Comments)  REACTION: legs hurt  legs hurt   Current Outpatient Medications on File Prior to Visit  Medication Sig Dispense Refill  albuterol  MDI, PROVENTIL , VENTOLIN , PROAIR , HFA 90 mcg/actuation inhaler INHALE 2 PUFFS UP TO EVERY 4 HOURS AS NEEDED FOR WHEEZING  ALPRAZolam  (XANAX ) 0.5 MG tablet Take 0.5 mg by mouth at bedtime as needed for Sleep  aspirin  81 MG EC tablet Take 81 mg by mouth once daily  budesonide -formoteroL  (SYMBICORT ) 160-4.5 mcg/actuation inhaler Inhale 2 inhalations into the lungs 2 (two) times daily  cholecalciferol (VITAMIN D3) 1000 unit tablet Take 1,000 Units by mouth once daily  famotidine  (PEPCID ) 20 MG tablet Take 20 mg by mouth  fluticasone  propionate (FLONASE ) 50 mcg/actuation nasal spray Place 2 sprays into both nostrils once daily  gabapentin  (NEURONTIN ) 300 MG capsule Take 600 mg by mouth at bedtime  ipratropium-albuteroL  (  DUO-NEB) nebulizer solution ipratropium-albuterol  0.5 mg-3 mg(2.5 mg base)/3 mL nebulization soln  methylcellulose, laxative, (CITRUCEL SUGAR FREE) Powd 1 tsp  daily  multivitamin tablet Take 1 tablet by mouth once daily  nicotine (NICODERM CQ) 21 mg/24 hr patch Place 21 mg onto the skin  pantoprazole  (PROTONIX ) 40 MG DR tablet Take 40 mg by mouth every morning before breakfast  rosuvastatin  (CRESTOR ) 10 MG tablet Take 10 mg by mouth once daily   No current facility-administered medications on file prior to visit.   Family History  Problem Relation Age of Onset  Myocardial Infarction (Heart attack) Mother  Skin cancer Father  Myocardial Infarction (Heart attack) Father  Diabetes Father    Social History   Tobacco Use  Smoking Status Former  Types: Cigarettes  Smokeless Tobacco Never    Social History   Socioeconomic History  Marital status: Married  Tobacco Use  Smoking status: Former  Types: Cigarettes  Smokeless tobacco: Never   Social Drivers of Corporate investment banker Strain: Low Risk (09/30/2022)  Received from [facility]  Overall Financial Resource Strain (CARDIA)  Difficulty of Paying Living Expenses: Not hard at all  Food Insecurity: No Food Insecurity (09/30/2022)  Received from [facility]  Hunger Vital Sign  Within the past 12 months, you worried that your food would run out before you got the money to buy more.: Never true  Within the past 12 months, the food you bought just didn't last and you didn't have money to get more.: Never true  Transportation Needs: No Transportation Needs (09/30/2022)  Received from [facility] - Transportation  Lack of Transportation (Medical): No  Lack of Transportation (Non-Medical): No  Physical Activity: Inactive (09/30/2022)  Received from [facility]  Exercise Vital Sign  On average, how many days per week do you engage in moderate to strenuous exercise (like a brisk walk)?: 0 days  On average, how many minutes do you engage in exercise at this level?: 0 min  Stress: Stress Concern Present (09/30/2022)  Received from [facility]  [facility] - Occupational Stress Questionnaire  Feeling of Stress : Very much  Social Connections: Moderately Isolated (09/30/2022)  Received from [facility]  Social Connection and Isolation Panel  In a typical week, how many times do you talk on the phone with family, friends, or neighbors?: Never  How often do you get together with friends or relatives?: Never  How often do you attend church or religious services?: More than 4 times per year  Do you belong to any clubs or organizations such as church groups, unions, fraternal or athletic groups, or school groups?: No  How often do you attend meetings of the clubs or organizations you belong to?: Never  Are you married, widowed, divorced, separated, never married, or living with a partner?: Married   Objective:  There were no vitals filed for this visit.  There is no height or weight on file to calculate BMI.  Physical Exam Vitals reviewed.  Constitutional:  General: She is not in acute distress. Appearance: Normal appearance.  HENT:  Head: Normocephalic and atraumatic.  Right Ear: External ear normal.  Left Ear: External ear normal.  Nose: Nose normal.  Mouth/Throat:  Mouth: Mucous membranes are moist.  Pharynx: Oropharynx is clear.  Eyes:  General: No scleral icterus. Extraocular Movements: Extraocular movements intact.  Conjunctiva/sclera: Conjunctivae normal.  Pupils: Pupils are equal, round, and reactive to light.  Cardiovascular:  Rate and Rhythm: Normal rate  and regular rhythm.  Pulses: Normal pulses.  Heart sounds: Normal heart sounds.  Pulmonary:  Effort: Pulmonary effort is normal. No respiratory distress.  Breath sounds: Normal breath sounds.  Abdominal:  General: Bowel sounds are normal.  Palpations: Abdomen is soft.  Tenderness: There is no abdominal tenderness.  Musculoskeletal:  General: No swelling, tenderness or deformity. Normal range of motion.  Cervical back: Normal range of motion and neck supple.   Skin: General: Skin is warm and dry.  Coloration: Skin is not jaundiced.  Neurological:  General: No focal deficit present.  Mental Status: She is alert and oriented to person, place, and time.  Psychiatric:  Mood and Affect: Mood normal.  Behavior: Behavior normal.     Breast: There is a palpable fullness behind the right nipple. There is no palpable mass in the left breast. There is no palpable axillary, supraclavicular, or cervical lymphadenopathy.  Labs, Imaging and Diagnostic Testing:  Assessment and Plan:   Diagnoses and all orders for this visit:  Malignant neoplasm of central portion of right breast in female, estrogen receptor positive (CMS/HHS-HCC) - [facility] Case Posting Request; Future   The patient appears to have a 1.3 cm retroareolar cancer with normal-appearing lymph nodes and all favorable markers. I have discussed with her in detail the different options for treatment and at this point she favors breast conservation which I feel is very reasonable. She would need a central lumpectomy and lose the nipple and areola. She would be a good candidate for sentinel node mapping at the same time. She is scheduled for an MRI to further define the extent of disease and then we can move forward with surgical scheduling. I have discussed with her in detail the risks and benefits of the operation as well as some of the technical aspects including use of a radioactive seed and the loss of the nipple and areola and she understands and wishes to proceed. We will move forward with surgical scheduling once the MRI is complete.

## 2023-11-25 NOTE — Interval H&P Note (Signed)
 History and Physical Interval Note:  11/25/2023 11:58 AM  Karina [name]  has presented today for surgery, with the diagnosis of RIGHT BREAST CANCER.  The various methods of treatment have been discussed with the patient and family. After consideration of risks, benefits and other options for treatment, the patient has consented to  Procedure(s) with comments: BREAST LUMPECTOMY WITH RADIOACTIVE SEED AND SENTINEL LYMPH NODE BIOPSY (Right) - GEN w/PEC BLOCK RIGHT BREAST RADIOACTIVE SEED LOCALIZED central LUMPECTOMY SENTINEL NODE BIOPSY as a surgical intervention.  The patient's history has been reviewed, patient examined, no change in status, stable for surgery.  I have reviewed the patient's chart and labs.  Questions were answered to the patient's satisfaction.     [name]

## 2023-11-25 NOTE — Anesthesia Procedure Notes (Signed)
 Anesthesia Regional Block: Pectoralis block   Pre-Anesthetic Checklist: , timeout performed,  Correct Patient, Correct Site, Correct Laterality,  Correct Procedure, Correct Position, site marked,  Risks and benefits discussed,  Surgical consent,  Pre-op evaluation,  At surgeon's request and post-op pain management  Laterality: Right and Upper  Prep: chloraprep       Needles:  Injection technique: Single-shot  Needle Type: Echogenic Needle     Needle Length: 9cm  Needle Gauge: 21     Additional Needles:   Procedures:,,,, ultrasound used (permanent image in chart),,    Narrative:  Start time: 11/25/2023 11:10 AM End time: 11/25/2023 11:16 AM Injection made incrementally with aspirations every 5 mL.  Performed by: Personally  Anesthesiologist: Leonce Athens, MD  Additional Notes: Pt identified in Holding room.  Monitors applied. Working IV access confirmed. Timeout, Sterile prep R clavicle and pec.  #21ga ECHOgenic Arrow block needle between ribs 4, 5, between pec minor and serratus, then between pec major and pec minor with US  guidance.  Total 30cc 0.5% Bupivacaine  1:200k epi injected incrementally after negative test dose.  Patient asymptomatic, VSS, no heme aspirated, tolerated well.   JAYSON Leonce, MD

## 2023-11-26 ENCOUNTER — Encounter (HOSPITAL_BASED_OUTPATIENT_CLINIC_OR_DEPARTMENT_OTHER): Payer: Self-pay | Admitting: General Surgery

## 2023-11-29 ENCOUNTER — Telehealth: Payer: Self-pay | Admitting: Family Medicine

## 2023-11-29 LAB — SURGICAL PATHOLOGY

## 2023-11-29 NOTE — Telephone Encounter (Signed)
 Type of forms received: Disability parking placard    Routed to: [name] pool   [employer] received by : Randall RAMAN     Individual made aware of 3-5 business day turn around (Y/N): y   Form completed and patient made aware of charges(Y/N): Y      Form location:  in provider's up front folder

## 2023-11-30 ENCOUNTER — Ambulatory Visit: Payer: Self-pay | Admitting: General Surgery

## 2023-11-30 ENCOUNTER — Telehealth: Payer: Self-pay | Admitting: Family Medicine

## 2023-11-30 NOTE — Telephone Encounter (Signed)
 Spoke with pt notifying her the form is ready to pick up. Pt expresses her thanks and states her husband will pick up today after work.   [Placed form at front office. Made copy to scan.]

## 2023-11-30 NOTE — Telephone Encounter (Signed)
 Done and in IN box

## 2023-11-30 NOTE — Telephone Encounter (Signed)
 Pt's spouse came in stating that paperwork was ready for pickup. Pt was given paperwork at this time.

## 2023-11-30 NOTE — Telephone Encounter (Signed)
Placed form in Dr. Royden Purl box.

## 2023-12-02 ENCOUNTER — Encounter: Payer: Self-pay | Admitting: General Practice

## 2023-12-02 NOTE — Progress Notes (Signed)
 [facility] Spiritual Care Note  Followed up with Karina [name] by phone for post-op check-in. She reports that she is coping with notable pain, does not understand her pathology results in MyChart, and plans to call today to schedule her two-week follow-up.  We plan to follow up for a spiritual/emotional check-in next month, giving her time to learn more from her team and to discern a plan for moving forward with treatment. Will route note to Dr Curvin, Dr Lanny, and nurse navigator, given Karina [name]'s concerns.  [address] Karina [name], [place], [facility] Pager [phone] Voicemail [phone]

## 2023-12-07 ENCOUNTER — Encounter (HOSPITAL_COMMUNITY): Payer: Self-pay

## 2023-12-07 DIAGNOSIS — Z170 Estrogen receptor positive status [ER+]: Secondary | ICD-10-CM | POA: Diagnosis not present

## 2023-12-15 ENCOUNTER — Encounter: Payer: Self-pay | Admitting: *Deleted

## 2023-12-15 DIAGNOSIS — Z170 Estrogen receptor positive status [ER+]: Secondary | ICD-10-CM

## 2023-12-19 ENCOUNTER — Ambulatory Visit: Payer: Self-pay | Attending: General Surgery | Admitting: Rehabilitation

## 2023-12-19 DIAGNOSIS — Z170 Estrogen receptor positive status [ER+]: Secondary | ICD-10-CM | POA: Insufficient documentation

## 2023-12-19 DIAGNOSIS — C50511 Malignant neoplasm of lower-outer quadrant of right female breast: Secondary | ICD-10-CM | POA: Insufficient documentation

## 2023-12-19 DIAGNOSIS — R293 Abnormal posture: Secondary | ICD-10-CM | POA: Insufficient documentation

## 2023-12-19 NOTE — Therapy (Incomplete)
 OUTPATIENT PHYSICAL THERAPY BREAST CANCER POST OP FOLLOW UP   Patient Name: Karina [name] MRN: [id number] DOB:03-Jun-1955, 68 y.o., female Today's Date: 12/19/2023  END OF SESSION:   Past Medical History:  Diagnosis Date   Allergic rhinitis    Allergy    Anxiety    Councelor- Slater Darner, no per pt   Asthma    Cataract    Colon polyps 08/2008   Adenomatous   Complication of anesthesia    violent when waking up   COPD (chronic obstructive pulmonary disease) (HCC)    Depression    no per pt   Diabetes mellitus    Gastritis 08/2008   H. pylori   GERD (gastroesophageal reflux disease)    Hypercholesteremia    Hypercholesteremia    Lung nodule    Menopausal disorder    Multiple sclerosis    Neuromuscular disorder (HCC)    Neuropathy    Plantar fasciitis    Seizures (HCC)    1 seizure in 2001 due to blood sugar dropping to 38, none since   Shingles    Left V1   Ulcerative colitis (HCC)    Urinary incontinence    Vertigo    Past Surgical History:  Procedure Laterality Date   BREAST BIOPSY Right 10/07/2023   US  RT BREAST BX W LOC DEV 1ST LESION IMG BX SPEC US  GUIDE 10/07/2023 [facility]   BREAST BIOPSY  11/25/2023   US  RT RADIOACTIVE SEED LOC 11/25/2023 [facility]   BREAST LUMPECTOMY WITH RADIOACTIVE SEED AND SENTINEL LYMPH NODE BIOPSY Right 11/25/2023   Procedure: BREAST LUMPECTOMY WITH RADIOACTIVE SEED AND SENTINEL LYMPH NODE BIOPSY;  Surgeon: Curvin Mt III, MD;  Location: [facility];  Service: General;  Laterality: Right;  GEN w/PEC BLOCK RIGHT BREAST RADIOACTIVE SEED LOCALIZED central LUMPECTOMY SENTINEL NODE BIOPSY   CATARACT EXTRACTION Bilateral 2023   CERVICAL DISCECTOMY  03/08/2002   x 2, Anterior; Fusion C4-5, C5-6, C6-7, Dr. Arley Helling   CHEST CT  11/06/2008   With small 4 mm nodule L lung base (rec re check in 1 year)   CHEST CT  07/06/2009   Re check chest CT - lung nodule stable   CHOLECYSTECTOMY     COLONOSCOPY  08/06/2008    Polyps/ re check 5 yrs   CT SINUS LTD W/O CM  02/05/2009   negative   DILATION AND CURETTAGE OF UTERUS     ESOPHAGOGASTRODUODENOSCOPY  08/06/2008   Erosive gastritis, h pylori (treated)   FOOT SURGERY     left plantar fascial problem   ROTATOR CUFF REPAIR     right   TONSILLECTOMY     TUBAL LIGATION     UPPER GASTROINTESTINAL ENDOSCOPY     Patient Active Problem List   Diagnosis Date Noted   Malignant neoplasm of lower-outer quadrant of right breast of female, estrogen receptor positive (HCC) 10/17/2023   Lump of breast, right 09/30/2023   Bilateral hip pain 09/30/2023   Depressed mood 10/04/2022   Left flank pain 07/31/2022   Petechiae 07/26/2022   Globus sensation 07/26/2022   Occult blood positive stool 05/25/2021   Colon cancer screening 05/07/2021   Estrogen deficiency 05/07/2021   Rheumatoid arthritis (HCC) 03/31/2021   Coronary artery calcification 03/31/2021   Hypokalemia 08/27/2020   Joint pain in both hands 10/12/2019   Head pain 08/22/2019   Elevated serum creatinine 04/24/2019   Frequent urination 04/20/2019   Decreased appetite 04/20/2019   Encounter for screening mammogram for breast cancer  04/20/2019   Seborrheic keratosis, inflamed 12/15/2017   B12 deficiency 08/24/2017   Left low back pain 08/24/2017   Erosive osteoarthritis of both hands 09/09/2016   Fatigue 09/07/2016   Bilateral hand pain 09/07/2016   Routine general medical examination at a health care facility 05/06/2015   Large breasts 05/06/2015   Lumbar disc disease 11/01/2014   Knee pain, bilateral 03/11/2014   Thoracic back pain 11/30/2013   Encounter for routine gynecological examination 01/31/2013   Encounter for Medicare annual wellness exam 01/23/2013   Chest pain 09/20/2012   Dyspnea 09/13/2012   Asthmatic bronchitis , chronic (HCC) 12/06/2011   Chronic cough 11/22/2011   Anxiety disorder 07/17/2009   HEMORRHOIDS-EXTERNAL 11/07/2008   IRRITABLE BOWEL SYNDROME 11/07/2008   Lung  nodule 10/14/2008   Prediabetes 10/14/2008   History of colonic polyps 10/08/2008   Vitamin D  deficiency 07/23/2008   SYNCOPE, HX OF 06/01/2008   FOOT SURGERY, HX OF 06/01/2008   DILATION AND CURETTAGE, HX OF 06/01/2008   OSTEOARTHRITIS, SHOULDER, RIGHT 03/22/2008   ROTATOR CUFF SYNDROME, RIGHT 03/22/2008   ARTHRALGIA 12/12/2007   Allergic rhinitis 08/01/2007   Former smoker 10/17/2006   Peripheral neuropathy 10/17/2006   FOOT PAIN, BILATERAL 10/17/2006   HYPOGLYCEMIA 09/30/2006   HYPERCHOLESTEROLEMIA 09/30/2006   MULTIPLE SCLEROSIS 09/30/2006   GERD 09/30/2006   FIBROCYSTIC BREAST DISEASE 09/30/2006    PCP: Laine Balls, MD  REFERRING PROVIDER: Deward Null, MD  REFERRING DIAG: Diagnosis C50.511,Z17.0 (ICD-10-CM) - Malignant neoplasm of lower-outer quadrant of right breast of female, estrogen receptor positive (HCC)  THERAPY DIAG:  No diagnosis found.  Rationale for Evaluation and Treatment: Rehabilitation  ONSET DATE: 10/03/23  SUBJECTIVE:                                                                                                                                                                                           SUBJECTIVE STATEMENT: ***  PERTINENT HISTORY:  Patient was diagnosed with right ILC.  It measures 1.3 cm and is located in the lower outer quadrant. It is ER/PR positive with a Ki67 of 1%. Medical hx includes MS, COPD, UC, Rt shoulder replacement, cervical fusion all levels per pt. Rt lumpectomy 11/25/23 with SLNB with 1/2 nodes positive. Will have radiation.    PATIENT GOALS:  Reassess how my recovery is going related to arm function, pain, and swelling.  PAIN:  Are you having pain? {OPRCPAIN:27236}  PRECAUTIONS: Recent Surgery, {RIGHT/LEFT:21944} UE Lymphedema risk, {Therapy precautions:24002}  RED FLAGS: {PT Red Flags:29287}   ACTIVITY LEVEL / LEISURE: ***   OBJECTIVE:   PATIENT SURVEYS:  QUICK DASH: ***  OBSERVATIONS: ***  POSTURE:   ***  LYMPHEDEMA ASSESSMENT:   UPPER EXTREMITY AROM/PROM:   A/PROM RIGHT   eval    Shoulder extension 38- pn  Shoulder flexion 160 - pn  Shoulder abduction 165 - pn  Shoulder internal rotation 65  Shoulder external rotation 80 - pn                          (Blank rows = not tested)   A/PROM LEFT   eval  Shoulder extension 65  Shoulder flexion 155  Shoulder abduction 170  Shoulder internal rotation 65  Shoulder external rotation 85                          (Blank rows = not tested)   CERVICAL AROM: All limited due to cervical fusion but no pain reproduced   Pn reproduced with palpation to the Rt UT - reviewed Rt UT, levator and posterior shoulder stretches which pt reports were part of her HEP previously.  Also self TpR and heat.     UPPER EXTREMITY STRENGTH: WFL    LYMPHEDEMA ASSESSMENTS (in cm):    LANDMARK RIGHT   eval  10 cm proximal to olecranon process 32.4  Olecranon process 26.1  10 cm proximal to ulnar styloid process 20.8  Just proximal to ulnar styloid process 16.6  Across hand at thumb web space 19.3  At base of 2nd digit 6.3  (Blank rows = not tested)   LANDMARK LEFT   eval  10 cm proximal to olecranon process 32.4  Olecranon process 26.5  10 cm proximal to ulnar styloid process 21.6  Just proximal to ulnar styloid process 15.8  Across hand at thumb web space 19.4  At base of 2nd digit 6.4  (Blank rows = not tested)  Surgery type/Date: *** Number of lymph nodes removed: *** Current/past treatment (chemo, radiation, hormone therapy): *** Other symptoms:  Heaviness/tightness {yes/no:20286} Pain {yes/no:20286} Pitting edema {yes/no:20286} Infections {yes/no:20286} Decreased scar mobility {yes/no:20286} Stemmer sign {yes/no:20286}  PATIENT EDUCATION:  Education details: *** Person educated: {Person educated:25204} Education method: {Education Method:25205} Education comprehension: {Education Comprehension:25206}  HOME EXERCISE  PROGRAM: Reviewed previously given post op HEP. ***  ASSESSMENT:  CLINICAL IMPRESSION: ***  Pt will benefit from skilled therapeutic intervention to improve on the following deficits: Decreased knowledge of precautions, impaired UE functional use, pain, decreased ROM, postural dysfunction.   PT treatment/interventions: ADL/Self care home management, {rehab planned interventions:25118::97110-Therapeutic exercises,97530- Therapeutic [phone]- Neuromuscular re-education,97535- Self Rjmz,02859- Manual therapy,Patient/Family education}   GOALS: Goals reviewed with patient? {yes/no:20286}  GOALS MET AT EVAL:  GOALS Name Target Date Goal status  1 Pt will be able to verbalize understanding of pertinent lymphedema risk reduction practices relevant to her dx specifically related to skin care.  Baseline:  No knowledge Eval Achieved at eval  2 Pt will be able to return demo and/or verbalize understanding of the post op HEP related to regaining shoulder ROM. Baseline:  No knowledge Eval Achieved at eval  3 Pt will be able to verbalize understanding of the importance of viewing the post op After Breast CA Class video for further lymphedema risk reduction education and therapeutic exercise.  Baseline:  No knowledge Eval Achieved at eval   LONG TERM GOALS:  (STG=LTG)  GOALS Name Target Date  Goal status  1 Pt will demonstrate she has regained full shoulder ROM and function post operatively compared to baselines.  Baseline: *** INITIAL  2  *** INITIAL  3  ***  INITIAL  4  *** INITIAL     PLAN:  PT FREQUENCY/DURATION: ***  PLAN FOR NEXT SESSION: ***   [facility]  [address]  [place] [address]  [phone]  After Breast Cancer Class Video It is recommended you view the ABC class video to be educated on lymphedema risk reduction. This video lasts for about 30 minutes. It can be viewed on our website here:  [email or web address]  Scar massage You can begin gentle scar massage to you incision sites. Gently place one hand on the incision and move the skin (without sliding on the skin) in various directions. Do this for a few minutes and then you can gently massage either coconut oil or vitamin E cream into the scars.  Compression garment You should continue wearing your compression bra until you feel like you no longer have swelling.  Home exercise Program Continue doing the exercises you were given until you feel like you can do them without feeling any tightness at the end.   Walking Program Studies show that 30 minutes of walking per day (fast enough to elevate your heart rate) can significantly reduce the risk of a cancer recurrence. If you can't walk due to other medical reasons, we encourage you to find another activity you could do (like a stationary bike or water exercise).  Posture After breast cancer surgery, people frequently sit with rounded shoulders posture because it puts their incisions on slack and feels better. If you sit like this and scar tissue forms in that position, you can become very tight and have pain sitting or standing with good posture. Try to be aware of your posture and sit and stand up tall to heal properly.  Follow up PT: It is recommended you return every 3 months for the first 3 years following surgery to be assessed on the SOZO machine for an L-Dex score. This helps prevent clinically significant lymphedema in 95% of patients. These follow up screens are 10 minute appointments that you are not billed for.  Larue Saddie SAUNDERS, PT 12/19/2023, 11:19 AM  .oprcptu

## 2023-12-20 ENCOUNTER — Telehealth: Payer: Self-pay | Admitting: *Deleted

## 2023-12-20 NOTE — Telephone Encounter (Signed)
 Received oncotype results 9/2%. Patient aware.  Referral placed for Dr. Shannon

## 2023-12-22 ENCOUNTER — Encounter: Payer: Self-pay | Admitting: *Deleted

## 2023-12-23 NOTE — Progress Notes (Signed)
 Location of Breast Cancer:right   Histology per Pathology Report:    Receptor Status: ER(100), PR (100), Her2-neu (0), Ki-67(1)  Did patient present with symptoms (if so, please note symptoms) or was this found on screening mammography?: ***  Past/Anticipated interventions by surgeon, if any:   Past/Anticipated interventions by medical oncology, if any: Chemotherapy ***  Lymphedema issues, if any:  {:18581} {t:21944}   Pain issues, if any:  {:18581} {PAIN DESCRIPTION:21022940}  SAFETY ISSUES: Prior radiation? {:18581} Pacemaker/ICD? {:18581} Possible current pregnancy?{:18581} Is the patient on methotrexate? {:18581}  Current Complaints / other details:  ***

## 2023-12-25 NOTE — Progress Notes (Signed)
 Radiation Oncology         [phone] ________________________________  Name: Karina [name] MRN: [id number]  Date: 12/26/2023  DOB: 1956/02/03  Re-Evaluation Note  CC: [name], Laine LABOR, MD  Lanny Callander, MD  No diagnosis found.  Diagnosis: Stage IA (cT1c, cN0, cM0) Right Breast LOQ, Invasive lobular carcinoma with right axillary SLN involvement, ER+ / PR+ / Her2-, Grade 2: s/p right breast lumpectomy with right axillary SLN excisions    Cancer Staging  Malignant neoplasm of lower-outer quadrant of right breast of female, estrogen receptor positive (HCC) Staging form: Breast, AJCC 8th Edition - Clinical stage from 10/07/2023: Stage IA (cT1c, cN0, cM0, G1, ER+, PR+, HER2-) - Signed by Lanny Callander, MD on 10/18/2023  Narrative:  The patient returns today to discuss radiation treatment options. She was seen in the multidisciplinary breast clinic on 10/19/23.   Since her consultation date, she presented for a bilateral breast MRI on 10/25/23 which demonstrated: the biopsy proven invasive lobular carcinoma in the subareolar right breast with nipple involvement, measuring 1.8 cm; an indeterminate 1.6 cm non-mass enhancement in the upper 12 o'clock right breast at a middle depth; and an area of enhancement in the skin of the right breast biopsy site indicating inflammation. Imaging otherwise showed no evidence of malignancy in the left breast and no evidence of lymphadenopathy bilaterally.   A biopsy of the indeterminate upper right breast enhancement was accordingly obtained on 11/02/23. Pathology thankfully showed no evidence of malignancy (with findings consisting of benign breast tissue with fat necrosis).   She opted to proceed with a right breast lumpectomy with right axillary SLN biopsies on 11/25/23 under the care of Dr. Curvin. Pathology from the procedure revealed: tumor the size of 2.2 cm; histology of grade 2 invasive lobular carcinoma; positive for LVI; all margins negative for invasive  carcinoma; margin status to invasive disease of 0.2 cm from the anterior/skin margin; nodal status of 1/2 right axillary SLN excisions positive for metastatic carcinoma, with the positive lymph node measuring 9 mm in the greatest extent (negative for extracapsular extension). Prognostic indicators (from the initial biopsy specimen) significant for: estrogen receptor 100% positive and progesterone receptor 100% positive, both with strong staining intensity; Proliferation marker Ki67 at 1%; Her2 status negative; Grade 2.   Oncotype DX was obtained on the final surgical sample and showed a recurrence score of 9 which predicts a risk of recurrence outside the breast over the next 9 years of 2%, if the patient's only systemic therapy were to be an antiestrogen for 5 years.  It also predicts no significant benefit from chemotherapy.  On review of systems, the patient reports ***. She denies *** and any other symptoms.    Allergies:  is allergic to atorvastatin, percocet [oxycodone-acetaminophen ], tecfidera [dimethyl fumarate], amitriptyline hcl, betaseron [interferon beta-1b], duloxetine, pregabalin, zoloft  [sertraline  hcl], crestor  [rosuvastatin ], clarithromycin, levofloxacin, and pseudoephedrine.  Meds: Current Outpatient Medications  Medication Sig Dispense Refill   albuterol  (VENTOLIN  HFA) 108 (90 Base) MCG/ACT inhaler INHALE 2 PUFFS UP TO EVERY 4 HOURS AS NEEDED FOR WHEEZING 3 each 3   ALPRAZolam  (XANAX ) 0.5 MG tablet Take 1 tablet (0.5 mg total) by mouth 2 (two) times daily as needed. For anxiety. 30 tablet 0   aspirin  EC 81 MG tablet Take 1 tablet (81 mg total) by mouth daily. Swallow whole. 90 tablet 3   budesonide -formoterol  (SYMBICORT ) 160-4.5 MCG/ACT inhaler Inhale 2 puffs into the lungs 2 (two) times daily. (Patient taking differently: Inhale 2 puffs into the lungs  2 (two) times daily as needed.) 3 each 1   cholecalciferol (VITAMIN D3) 25 MCG (1000 UNIT) tablet Take 1,000 Units by mouth daily.      famotidine  (PEPCID ) 20 MG tablet TAKE 1 TABLET (20 MG TOTAL) BY MOUTH DAILY. TAKE BEFORE DINNER 90 tablet 2   fluticasone  (FLONASE ) 50 MCG/ACT nasal spray Place 2 sprays into both nostrils daily. (Patient taking differently: Place 2 sprays into both nostrils daily as needed.) 16 g 11   gabapentin  (NEURONTIN ) 300 MG capsule TAKE 2 CAPSULES BY MOUTH AT BEDTIME. 180 capsule 1   ipratropium-albuterol  (DUONEB) 0.5-2.5 (3) MG/3ML SOLN TAKE 3 MLS BY NEBULIZATION EVERY 6 (SIX) HOURS AS NEEDED. 360 mL 0   methylcellulose (CITRUCEL) oral powder 1 tsp daily     Multiple Vitamin (MULTIVITAMIN) tablet Take 1 tablet by mouth daily.     nicotine (NICODERM CQ - DOSED IN MG/24 HOURS) 21 mg/24hr patch Place 21 mg onto the skin daily.     pantoprazole  (PROTONIX ) 40 MG tablet Take 1 tablet (40 mg total) by mouth daily before breakfast.     rosuvastatin  (CRESTOR ) 10 MG tablet Take 1 tablet (10 mg total) by mouth daily. 90 tablet 3   Spacer/Aero-Holding Chambers (AEROCHAMBER MV) inhaler Use as instructed 1 each 0   traMADol  (ULTRAM ) 50 MG tablet Take 1 tablet (50 mg total) by mouth every 6 (six) hours as needed. 15 tablet 0   No current facility-administered medications for this encounter.    Physical Findings: The patient is in no acute distress. Patient is alert and oriented.  vitals were not taken for this visit.  No significant changes. Lungs are clear to auscultation bilaterally. Heart has regular rate and rhythm. No palpable cervical, supraclavicular, or axillary adenopathy. Abdomen soft, non-tender, normal bowel sounds. Left Breast: no palpable mass, nipple discharge or bleeding. Right Breast: ***  Lab Findings: Lab Results  Component Value Date   WBC 7.3 10/19/2023   HGB 14.3 10/19/2023   HCT 43.7 10/19/2023   MCV 93.6 10/19/2023   PLT 242 10/19/2023    Radiographic Findings: MM BREAST SURGICAL SPECIMEN Result Date: 11/25/2023 CLINICAL DATA:  Status post RIGHT lumpectomy for invasive lobular  carcinoma. EXAM: SPECIMEN RADIOGRAPH OF THE RIGHT BREAST COMPARISON:  Previous exam(s). FINDINGS: Status post excision of the right breast. The radioactive seed and biopsy marker clip are present, completely intact, and were marked for pathology. IMPRESSION: Specimen radiograph of the right breast. Electronically Signed   By: Aliene Lloyd M.D.   On: 11/25/2023 16:03    Impression:  Stage IA (cT1c, cN0, cM0) Right Breast LOQ, Invasive lobular carcinoma with right axillary SLN involvement, ER+ / PR+ / Her2-, Grade 2: s/p right breast lumpectomy with right axillary SLN excisions   ***  Plan:  Patient is scheduled for CT simulation {date/later today}. ***  -----------------------------------  Lynwood CHARM Nasuti, PhD, MD  This document serves as a record of services personally performed by Lynwood Nasuti, MD. It was created on his behalf by Dorthy Fuse, a trained medical scribe. The creation of this record is based on the scribe's personal observations and the provider's statements to them. This document has been checked and approved by the attending provider.

## 2023-12-26 ENCOUNTER — Ambulatory Visit
Admission: RE | Admit: 2023-12-26 | Discharge: 2023-12-26 | Disposition: A | Discharge status: Home / Self Care | Source: Ambulatory Visit | Attending: Radiation Oncology | Admitting: Radiation Oncology

## 2023-12-26 ENCOUNTER — Encounter: Payer: Self-pay | Admitting: Radiation Oncology

## 2023-12-26 VITALS — BP 142/83 | HR 70 | Temp 97.7°F | Resp 20 | Ht 60.0 in | Wt 175.8 lb

## 2023-12-26 DIAGNOSIS — Z170 Estrogen receptor positive status [ER+]: Secondary | ICD-10-CM

## 2023-12-26 DIAGNOSIS — C50511 Malignant neoplasm of lower-outer quadrant of right female breast: Secondary | ICD-10-CM | POA: Diagnosis not present

## 2023-12-28 ENCOUNTER — Encounter: Payer: Self-pay | Admitting: General Practice

## 2023-12-28 NOTE — Progress Notes (Signed)
 [facility] Spiritual Care Note  Followed up with Ms Capobianco by phone as planned. She reports concern about what her radiation experience will be like in light of her severe back/leg pain. Otherwise, she reports that she is managing ok overall.   We plan to follow up by phone in ca two weeks to check in once radiation is underway, and she knows to contact chaplain whenever needed/desired.  [address] [place], [place], [facility] Pager [phone] Voicemail [phone]

## 2023-12-30 ENCOUNTER — Inpatient Hospital Stay: Attending: Hematology
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
## 2023-12-30 NOTE — Progress Notes (Signed)
 I reviewed patient visit with social work Tax inspector. I concur with the treatment plan as documented in the SW intern's note.   Analis Distler E Siaosi Alter, LCSW Clinical Child psychotherapist

## 2024-01-02 ENCOUNTER — Encounter: Payer: Self-pay | Admitting: *Deleted

## 2024-01-02 DIAGNOSIS — C50511 Malignant neoplasm of lower-outer quadrant of right female breast: Secondary | ICD-10-CM

## 2024-01-03 ENCOUNTER — Ambulatory Visit: Admitting: Radiation Oncology

## 2024-01-03 ENCOUNTER — Telehealth: Payer: Self-pay | Admitting: Radiation Oncology

## 2024-01-03 NOTE — Telephone Encounter (Signed)
 Pt called to cancel today's CT SIM appt. She advised at this time she is not sure if she wants to proceed with radiation; she "has had issues with [her] MS since the surgery so [she] is dealing with that right now and [is] not sure she wants potentially more issues with radiation." She was advised she may get another call from Dr. Shannon or his nurse to make sure all questions are answered and concerns are addressed. CT SIM notified via telephone to cx today's appt. Message routed to care team via email.

## 2024-01-09 ENCOUNTER — Encounter: Payer: Self-pay | Admitting: Radiology

## 2024-01-11 ENCOUNTER — Encounter: Payer: Self-pay | Admitting: General Practice

## 2024-01-11 ENCOUNTER — Ambulatory Visit: Admitting: Radiation Oncology

## 2024-01-11 ENCOUNTER — Telehealth: Payer: Self-pay | Admitting: Radiation Oncology

## 2024-01-11 NOTE — Progress Notes (Signed)
 Radiation Oncology         [phone] ________________________________  Name: Karina [name] MRN: [id number]  Date: 01/12/2024  DOB: 08-19-1955  Follow-Up Visit Note  CC: [name], Laine LABOR, MD  Lanny Callander, MD  No diagnosis found.  Diagnosis:  Stage IA (cT1c, cN0, cM0) Right Breast LOQ, Invasive lobular carcinoma with right axillary SLN involvement, ER+ / PR+ / Her2-, Grade 2: s/p right breast lumpectomy with right axillary SLN excisions  pT2, pN1a    Cancer Staging  Malignant neoplasm of lower-outer quadrant of right breast of female, estrogen receptor positive (HCC) Staging form: Breast, AJCC 8th Edition - Clinical stage from 10/07/2023: Stage IA (cT1c, cN0, cM0, G1, ER+, PR+, HER2-) - Signed by Lanny Callander, MD on 10/18/2023   Narrative:  The patient returns today for routine follow-up. She was last seen in office on 12/26/23 by PA Wyatt for a re-evaluation visit.   No significant changes since the past was last seen.                               To recall, surgical pathology does demonstrate carcinoma within a sampled lymph node. Dr. Shannon recommended radiation to reduce the risk of locoregional disease recurrence. Treatment will consist of delivering 50.05 Gy in 28 fractions to the right breast followed by a boost of 12 Gy in 6 fractions to the lumpectomy cavity.   Allergies:  is allergic to atorvastatin, percocet [oxycodone-acetaminophen ], tecfidera [dimethyl fumarate], amitriptyline hcl, betaseron [interferon beta-1b], duloxetine, pregabalin, zoloft  [sertraline  hcl], crestor  [rosuvastatin ], clarithromycin, levofloxacin, and pseudoephedrine.  Meds: Current Outpatient Medications  Medication Sig Dispense Refill   albuterol  (VENTOLIN  HFA) 108 (90 Base) MCG/ACT inhaler INHALE 2 PUFFS UP TO EVERY 4 HOURS AS NEEDED FOR WHEEZING 3 each 3   ALPRAZolam  (XANAX ) 0.5 MG tablet Take 1 tablet (0.5 mg total) by mouth 2 (two) times daily as needed. For anxiety. 30 tablet 0   aspirin  EC 81 MG  tablet Take 1 tablet (81 mg total) by mouth daily. Swallow whole. 90 tablet 3   budesonide -formoterol  (SYMBICORT ) 160-4.5 MCG/ACT inhaler Inhale 2 puffs into the lungs 2 (two) times daily. (Patient taking differently: Inhale 2 puffs into the lungs 2 (two) times daily as needed.) 3 each 1   cholecalciferol (VITAMIN D3) 25 MCG (1000 UNIT) tablet Take 1,000 Units by mouth daily.     famotidine  (PEPCID ) 20 MG tablet TAKE 1 TABLET (20 MG TOTAL) BY MOUTH DAILY. TAKE BEFORE DINNER 90 tablet 2   fluticasone  (FLONASE ) 50 MCG/ACT nasal spray Place 2 sprays into both nostrils daily. (Patient taking differently: Place 2 sprays into both nostrils daily as needed.) 16 g 11   gabapentin  (NEURONTIN ) 300 MG capsule TAKE 2 CAPSULES BY MOUTH AT BEDTIME. 180 capsule 1   ipratropium-albuterol  (DUONEB) 0.5-2.5 (3) MG/3ML SOLN TAKE 3 MLS BY NEBULIZATION EVERY 6 (SIX) HOURS AS NEEDED. 360 mL 0   methylcellulose (CITRUCEL) oral powder 1 tsp daily     Multiple Vitamin (MULTIVITAMIN) tablet Take 1 tablet by mouth daily.     nicotine (NICODERM CQ - DOSED IN MG/24 HOURS) 21 mg/24hr patch Place 21 mg onto the skin daily.     pantoprazole  (PROTONIX ) 40 MG tablet Take 1 tablet (40 mg total) by mouth daily before breakfast.     rosuvastatin  (CRESTOR ) 10 MG tablet Take 1 tablet (10 mg total) by mouth daily. 90 tablet 3   Spacer/Aero-Holding Chambers (AEROCHAMBER MV) inhaler Use as  instructed 1 each 0   traMADol  (ULTRAM ) 50 MG tablet Take 1 tablet (50 mg total) by mouth every 6 (six) hours as needed. 15 tablet 0   No current facility-administered medications for this visit.    Physical Findings: The patient is in no acute distress. Patient is alert and oriented.  vitals were not taken for this visit. .  No significant changes. Lungs are clear to auscultation bilaterally. Heart has regular rate and rhythm. No palpable cervical, supraclavicular, or axillary adenopathy. Abdomen soft, non-tender, normal bowel sounds.   Lab  Findings: Lab Results  Component Value Date   WBC 7.3 10/19/2023   HGB 14.3 10/19/2023   HCT 43.7 10/19/2023   MCV 93.6 10/19/2023   PLT 242 10/19/2023    Radiographic Findings: No results found.  Impression: Stage IA (cT1c, cN0, cM0) Right Breast LOQ, Invasive lobular carcinoma with right axillary SLN involvement, ER+ / PR+ / Her2-, Grade 2: s/p right breast lumpectomy with right axillary SLN excisions   The patient is recovering from the effects of radiation.  ***  Plan:  ***   *** minutes of total time was spent for this patient encounter, including preparation, face-to-face counseling with the patient and coordination of care, physical exam, and documentation of the encounter. ____________________________________  Lynwood CHARM Nasuti, PhD, MD  This document serves as a record of services personally performed by Lynwood Nasuti, MD. It was created on his behalf by Reymundo Cartwright, a trained medical scribe. The creation of this record is based on the scribe's personal observations and the provider's statements to them. This document has been checked and approved by the attending provider.

## 2024-01-11 NOTE — Progress Notes (Signed)
      Location of Breast Cancer:right    Histology per Pathology Report:     Receptor Status: ER(100), PR (100), Her2-neu (0), Ki-67(1)   Did patient present with symptoms (if so, please note symptoms) or was this found on screening mammography?:    Past/Anticipated interventions by surgeon, if any:    Past/Anticipated interventions by medical oncology, if any: Chemotherapy    Lymphedema issues, if any:  Reports swelling to right axilla and feels like knots under the skin. Reports hardness to surgical incision.        Pain issues, if any:  Reports back pain has somewhat improved.    Skin: Intact, reports  SAFETY ISSUES: Prior radiation? no Pacemaker/ICD? no Possible current pregnancy?no Is the patient on methotrexate? no   Current Complaints / other details:

## 2024-01-11 NOTE — Progress Notes (Signed)
 SPIRITUAL CARE AND COUNSELING CONSULT NOTE   VISIT SUMMARY  Referred by Social Work for follow-up regarding Ms Parillo's cancellation of CT SIM appointment due to outstanding questions. Reached Ms Delone by phone. With her permission and appreciation, reported her concerns directly to Dr Colton nurse for assistance. Ms Ricard requests appointment with Dr Shannon as opportunity to ask clarifying questions regarding indications for radiation treatment.   SPIRITUAL ENCOUNTER                                                                                                                                                                      Type of Visit: Follow up Care provided to:: Patient Referral source: Social worker/Care management/TOC Reason for visit: Routine spiritual support  SPIRITUAL FRAMEWORK  Presenting Themes: Goals in life/care Values/beliefs: Patient wants to understand value of radiation treatment before proceeding Needs/Challenges/Barriers: Patient has questions for radiation oncologist Patient Stress Factors: Lack of knowledge   GOALS   Self/Personal Goals: Secure appointment with Dr Shannon re radiation questions Clinical Care Goals: Consult Dr Colton nurse directly for follow-up assistance   INTERVENTIONS   Spiritual Care Interventions Made:  (Nurse consultation on patient's behalf)   INTERVENTION OUTCOMES   Outcomes: Awareness of support  SPIRITUAL CARE PLAN   Spiritual Care Issues Still Outstanding: No further spiritual care needs at this time (see row info) Follow up plan : Patient aware of ongoing chaplain availability, in case future needs arise    Chaplain Olam Filiberto Lemming, [facility] Pager [phone] Voicemail [phone]

## 2024-01-11 NOTE — Telephone Encounter (Signed)
 Called pt to schedule appt with Dr. Shannon at the request of pt via chaplain to nurse [facility]. Pt was contacted and she accepted 11/6 @ 1:30pm for appt.

## 2024-01-12 ENCOUNTER — Ambulatory Visit
Admission: RE | Admit: 2024-01-12 | Discharge: 2024-01-12 | Disposition: A | Discharge status: Home / Self Care | Source: Ambulatory Visit | Attending: Radiation Oncology | Admitting: Radiation Oncology

## 2024-01-12 ENCOUNTER — Encounter: Payer: Self-pay | Admitting: Radiation Oncology

## 2024-01-12 ENCOUNTER — Ambulatory Visit

## 2024-01-12 VITALS — BP 155/79 | HR 75 | Temp 98.2°F | Resp 19 | Wt 174.8 lb

## 2024-01-12 DIAGNOSIS — Z7982 Long term (current) use of aspirin: Secondary | ICD-10-CM | POA: Insufficient documentation

## 2024-01-12 DIAGNOSIS — Z7951 Long term (current) use of inhaled steroids: Secondary | ICD-10-CM | POA: Diagnosis not present

## 2024-01-12 DIAGNOSIS — Z170 Estrogen receptor positive status [ER+]: Secondary | ICD-10-CM | POA: Diagnosis not present

## 2024-01-12 DIAGNOSIS — Z79899 Other long term (current) drug therapy: Secondary | ICD-10-CM | POA: Diagnosis not present

## 2024-01-12 DIAGNOSIS — C50511 Malignant neoplasm of lower-outer quadrant of right female breast: Secondary | ICD-10-CM

## 2024-01-12 DIAGNOSIS — Z1732 Human epidermal growth factor receptor 2 negative status: Secondary | ICD-10-CM | POA: Diagnosis not present

## 2024-01-12 DIAGNOSIS — J449 Chronic obstructive pulmonary disease, unspecified: Secondary | ICD-10-CM | POA: Insufficient documentation

## 2024-01-13 ENCOUNTER — Ambulatory Visit

## 2024-01-16 ENCOUNTER — Telehealth: Payer: Self-pay | Admitting: Adult Health

## 2024-01-16 ENCOUNTER — Ambulatory Visit

## 2024-01-16 NOTE — Telephone Encounter (Addendum)
 Patient said have had breast cancer, have had the surgery and due to start radiation on Tuesday 01/24/24. Would like to discuss with Dr. Vear the effect of radiation on the lympnodes that effects my immune system and my MS. Would like a call back.

## 2024-01-17 ENCOUNTER — Ambulatory Visit

## 2024-01-18 ENCOUNTER — Ambulatory Visit: Attending: Radiology | Admitting: Occupational Therapy

## 2024-01-18 ENCOUNTER — Ambulatory Visit

## 2024-01-18 ENCOUNTER — Encounter: Payer: Self-pay | Admitting: Occupational Therapy

## 2024-01-18 DIAGNOSIS — Z170 Estrogen receptor positive status [ER+]: Secondary | ICD-10-CM | POA: Insufficient documentation

## 2024-01-18 DIAGNOSIS — I972 Postmastectomy lymphedema syndrome: Secondary | ICD-10-CM | POA: Diagnosis not present

## 2024-01-18 DIAGNOSIS — C50511 Malignant neoplasm of lower-outer quadrant of right female breast: Secondary | ICD-10-CM | POA: Insufficient documentation

## 2024-01-18 NOTE — Therapy (Unsigned)
 OUTPATIENT OCCUPATIONAL THERAPY  EVALUATION  RIGHT UPPER EXTREMITY/ UPPER QUADRANT, POST MASTECTOMY LYMPHEDEMA  Patient Name: Karina [name] MRN: [id number] DOB:Jul 30, 1955, 68 y.o., female Today's Date: 01/19/2024  END OF SESSION:   OT End of Session - 01/19/24 1507     Visit Number 1    Number of Visits 36    Date for Recertification  04/17/24    OT Start Time 0200    OT Stop Time 0305    OT Time Calculation (min) 65 min    Activity Tolerance Patient tolerated treatment well;No increased pain    Behavior During Therapy WFL for tasks assessed/performed            Past Medical History:  Diagnosis Date   Allergic rhinitis    Allergy    Anxiety    [place]- [place], no per pt   Asthma    Cataract    Colon polyps 08/2008   Adenomatous   Complication of anesthesia    violent when waking up   COPD (chronic obstructive pulmonary disease) (HCC)    Depression    no per pt   Diabetes mellitus    Gastritis 08/2008   H. pylori   GERD (gastroesophageal reflux disease)    Hypercholesteremia    Hypercholesteremia    Lung nodule    Menopausal disorder    Multiple sclerosis    Neuromuscular disorder (HCC)    Neuropathy    Plantar fasciitis    Seizures (HCC)    1 seizure in 2001 due to blood sugar dropping to 38, none since   Shingles    Left V1   Ulcerative colitis (HCC)    Urinary incontinence    Vertigo    Past Surgical History:  Procedure Laterality Date   BREAST BIOPSY Right 10/07/2023   US  RT BREAST BX W LOC DEV 1ST LESION IMG BX SPEC US  GUIDE 10/07/2023 [facility]   BREAST BIOPSY  11/25/2023   US  RT RADIOACTIVE SEED LOC 11/25/2023 [facility]   BREAST LUMPECTOMY WITH RADIOACTIVE SEED AND SENTINEL LYMPH NODE BIOPSY Right 11/25/2023   Procedure: BREAST LUMPECTOMY WITH RADIOACTIVE SEED AND SENTINEL LYMPH NODE BIOPSY;  Surgeon: Curvin Mt III, MD;  Location: [facility];  Service: General;  Laterality: Right;  GEN w/PEC BLOCK RIGHT  BREAST RADIOACTIVE SEED LOCALIZED central LUMPECTOMY SENTINEL NODE BIOPSY   CATARACT EXTRACTION Bilateral 2023   CERVICAL DISCECTOMY  03/08/2002   x 2, Anterior; Fusion C4-5, C5-6, C6-7, Dr. Arley Helling   CHEST CT  11/06/2008   With small 4 mm nodule L lung base (rec re check in 1 year)   CHEST CT  07/06/2009   Re check chest CT - lung nodule stable   CHOLECYSTECTOMY     COLONOSCOPY  08/06/2008   Polyps/ re check 5 yrs   CT SINUS LTD W/O CM  02/05/2009   negative   DILATION AND CURETTAGE OF UTERUS     ESOPHAGOGASTRODUODENOSCOPY  08/06/2008   Erosive gastritis, h pylori (treated)   FOOT SURGERY     left plantar fascial problem   ROTATOR CUFF REPAIR     right   TONSILLECTOMY     TUBAL LIGATION     UPPER GASTROINTESTINAL ENDOSCOPY     Patient Active Problem List   Diagnosis Date Noted   Malignant neoplasm of lower-outer quadrant of right breast of female, estrogen receptor positive (HCC) 10/17/2023   Lump of breast, right 09/30/2023   Bilateral hip pain 09/30/2023   Depressed mood  10/04/2022   Left flank pain 07/31/2022   Petechiae 07/26/2022   Globus sensation 07/26/2022   Occult blood positive stool 05/25/2021   Colon cancer screening 05/07/2021   Estrogen deficiency 05/07/2021   Rheumatoid arthritis (HCC) 03/31/2021   Coronary artery calcification 03/31/2021   Hypokalemia 08/27/2020   Joint pain in both hands 10/12/2019   Head pain 08/22/2019   Elevated serum creatinine 04/24/2019   Frequent urination 04/20/2019   Decreased appetite 04/20/2019   Encounter for screening mammogram for breast cancer 04/20/2019   Seborrheic keratosis, inflamed 12/15/2017   B12 deficiency 08/24/2017   Left low back pain 08/24/2017   Erosive osteoarthritis of both hands 09/09/2016   Fatigue 09/07/2016   Bilateral hand pain 09/07/2016   Routine general medical examination at a health care facility 05/06/2015   Large breasts 05/06/2015   Lumbar disc disease 11/01/2014   Knee pain,  bilateral 03/11/2014   Thoracic back pain 11/30/2013   Encounter for routine gynecological examination 01/31/2013   Encounter for Medicare annual wellness exam 01/23/2013   Chest pain 09/20/2012   Dyspnea 09/13/2012   Asthmatic bronchitis , chronic (HCC) 12/06/2011   Chronic cough 11/22/2011   Anxiety disorder 07/17/2009   HEMORRHOIDS-EXTERNAL 11/07/2008   IRRITABLE BOWEL SYNDROME 11/07/2008   Lung nodule 10/14/2008   Prediabetes 10/14/2008   History of colonic polyps 10/08/2008   Vitamin D  deficiency 07/23/2008   SYNCOPE, HX OF 06/01/2008   FOOT SURGERY, HX OF 06/01/2008   DILATION AND CURETTAGE, HX OF 06/01/2008   OSTEOARTHRITIS, SHOULDER, RIGHT 03/22/2008   ROTATOR CUFF SYNDROME, RIGHT 03/22/2008   ARTHRALGIA 12/12/2007   Allergic rhinitis 08/01/2007   Former smoker 10/17/2006   Peripheral neuropathy 10/17/2006   FOOT PAIN, BILATERAL 10/17/2006   HYPOGLYCEMIA 09/30/2006   HYPERCHOLESTEROLEMIA 09/30/2006   MULTIPLE SCLEROSIS 09/30/2006   GERD 09/30/2006   FIBROCYSTIC BREAST DISEASE 09/30/2006    PCP: Laine DELENA Balls, MD  REFERRING PROVIDER: Leeroy CHRISTELLA Due, PA-C  REFERRING DIAG: 197.2  THERAPY DIAG:  Post-mastectomy lymphedema syndrome  ONSET DATE: 11/25/23  Rationale for Evaluation and Treatment: Rehabilitation  SUBJECTIVE                                                                                                                                                                                           SUBJECTIVE STATEMENT: Karina [name] is referred to Occupational Therapy by Leeroy Due, PA-C for evaluation and treatment of R postmastectomy lymphedema. Pt reports swelling and discomfort in her breast and axilla have not resolved since surgery. She complains of numbness and tingling in the R breast, axilla and proximal posterior arm.  Pt reports a  sensation that there are knots under her skin along incision scars. She complains of deep axillary pain intermittently.  Pt does not have a prophylactic compression arm sleeve , and she reports she was unable to tolerate a compression bra after surgery because it was too tight. Pt reports she has decided not to take radiation because she is afraid it might kill her because she has Karina. I did a bunch of research on line and it is possible. OT encouraged Pt to speak with her oncology team ASAP to be certain she is making informed choices.  PERTINENT HISTORY:  R BREAST LUMPECTOMY WITH RADIOACTIVE SEED AND SENTINEL LYMPH NODE BIOPSY. 1+/2 LN. 11/25/2023  Asthma,COPD, Depression, Diabetes, Lung nodule Karina, Neuromuscular disorder, neuropathy, Plantar fasciitis, Seizures, Ulcerative colitis, Urinary incontinence, former smoker' anxiety disorder, arthralgia, R rotator cuff syndrome, thoracic back pain, RA,   PAIN:  Are you having pain? No NPRS scale: 0/10 Pain location: RUE/RUQ PAIN TYPE: numbness, stiffness Pain description: fullness, heaviness  Aggravating factors: housecleaning, washing clothes, cooking supper Relieving factors: rest breaks  PRECAUTIONS: Fall  WEIGHT BEARING RESTRICTIONS: No  FALLS:  Has patient fallen in last 6 months? Yes. Number of falls 1 after episode of vertigo fell onto bed My balance has been off more.  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  COGNITION:  Overall cognitive status: Within functional limits for tasks assessed   OBSERVATIONS / OTHER ASSESSMENTS: STAGE 0, Subclinical, RUE/RUQ , Post mastectomy  lymphedema  SENSATION: Light touch: Deficits   Stereognosis: Appears intact Hot/Cold: Appears intact Proprioception: Appears intact  POSTURE: Guarding, shoulder protraction and elevation; head forward w cervical kyphosis-appears flexible  HAND DOMINANCE: Right  UPPER EXTREMITY AROM/PROM: WFL bilaterally, all planes  CERVICAL AROM:     Flexion WFL  Extension WFL  Right lateral flexion WFL  Left lateral flexion WFL  Right rotation WFL  Left rotation WFL    UPPER EXTREMITY STRENGTH: [facility]  SURGERY TYPE/DATE: 11/25/23 R lumpectomy with SLNB and radioactive seed placement  NUMBER OF LYMPH NODES REMOVED: 1+/2  CHEMOTHERAPY: none  RADIATION:pending  HORMONE TREATMENT: pending  INFECTIONS: denies seroma; denies cellulitis  LYMPHEDEMA ASSESSMENTS:  BLE COMPARATIVE LIMB VOLUMETRICS TBA OT Rx 1  LANDMARK RIGHT  RX, Dominant  R circ at MPs   Wrist crease from base of small finger nail   RUE C-G ml  Limb Volume differential (LVD)  %  Volume change since last measured %  Volume change since initial %  (Blank rows = not tested)  LANDMARK LEFT  10/28/21  L circ at MPs   R Wrist crease from base of small finger nail ml  LUE C-G ml  Limb Volume differential (LVD)  %  Volume change since last measured %  Volume change since initial %  (Blank rows = not tested)   GAIT: Distance walked: >500 ft Assistive device utilized: None Level of assistance: Modified independence and extra time- slow pace   PATIENT SURVEYS:  Lymphedema Life Impact Scale (LLIS): TBA Rx visit 1  TREATMENT THIS DATE:  OT initial evaluation Pt  and family edu  PATIENT EDUCATION:  Education details: Eval edu Discussed differential diagnoses for various swelling disorders. Provided basic level education regarding lymphatic structure and function, etiology, onset patterns, stages of progression, and prevention to limit infection risk, worsening condition and further functional decline. Pt edu for aught  interaction between blood circulatory system and lymphatic circulation.Discussed  impact of gravity and co-morbidities on lymphatic function. Outlined Complete Decongestive Therapy (CDT)  as standard of care and provided in depth information regarding 4 primary components of Intensive and Self Management Phases, including Manual Lymph Drainage (MLD), compression wrapping and garments, skin care, and therapeutic exercise. Dempsey discussion with re need for frequent attendance and high burden of care when caregiver is needed, impact of co morbidities. We discussed  the chronic, progressive nature of lymphedema and Importance of daily, ongoing LE self-care essential for limiting progression and infection risk.  Person educated: Patient  Education method: Explanation, Demonstration, and Handouts Education comprehension: verbalized understanding, returned demonstration, verbal cues required, and needs further education  LYMPHEDEMA SELF-CARE HOME PROGRAM: lymphatic pumping there ex- 1 set of 10 each element, in order. Hold 5. 2 x daily  PROPHYLACTIC COMPRESSION ARM SLEEVE ( OFF THE SHELF, MEDICAL GRADE CCL 1 ( 20-30 MMhG)  3. Soft and comfortable compression bra, or camisole, that Pt can add a soft spot compression pad to PRN  By end of Intensive Phase CDT Pt should be fit with appropriate compression garments/ and/or devices to control swelling. In this case Off-the-shelf, medical grade, gradient compression arm sleeve and a compression bra are medically necessary to fit the dimensions of the affected extremity and breast, and to provide appropriate medical grade, graduated compression essential for optimally managing chronic, progressive lymphedema. Multiple compression garments are needed to ensure proper hygiene to limit infection risk. Custom compression garments should be replaced q 3-6 months When worn consistently for optimal lymphedema self-management over time. HOS devices, medically necessary to  limit fibrosis buildup in tissue, should be replaced q 2 years and PRN when worn out.    4. Daily simple self MLD used in conjunction with pneumatic device to engage thoracic duct return and to stimulate the terminus LNs in subclavian region.  5. Daily skin care with low ph lotion matching skin ph to reduce infection risk.  ASSESSMENT:  CLINICAL IMPRESSION: Karina [name] is a 68 yo female presenting with subclinical, stage 0, RUE/RUQ , post mastectomy lymphedema s/p R lumpectomy and SLNB ( 1+/2). Radiation is pending. But Pt reports today that she is planning to decline it because she is afraid it may make her Karina worse, or even cause her death. OT encouraged Pt to speak with her oncology team ASAP to ensure she is making informed decisions re her Rx.  The sentinel node technique is defined as the removal of no more than four lymph nodes, where the risk is about 6%.  If you have more than four lymph nodes removed, the risk rises to 15% to 25%Patients who have  axillary lymph node dissection have a lifetime risk of 15-25% of developing lymphedema. For individuals whose surgery is limited to sentinel node  techniques, without adjuvant radiation, the risk is about 6%. radiation can traumatize the lymphatic system equivalently  to surgery, even if surgery is not done. However, just as all surgery is not alike, not all radiation therapy is the same. In general, radiation therapy confers risk that is roughly equivalent  to axillary lymph node dissection and increases the risk associated with surgery if  both treatments are performed.   [email or web address]  Karina [name] presents with mild swelling of the R breast and R axilla. Palpable, nodular scar tissue is noted along well healed incision lines. Pt expresses concern about numbness of the R breast and posterior R arm and axilla.  Although we did not have time to complete comparative limb volumetrics today during the eval, we will on  Rx visit 1. By visual assessment the upper extremities appear symmetrical and high protein, thickened lymphatic congestion is not palpable in the arm. Swelling is absent in the R trunk and scapula. Pt presents with full, BUE AROM without pain at end ranges. Pt does complain that she doesn't use the R arm much due to shoulder pain, which I suspect is stiffness 2/2 R rotator cuff syndrome. OT encouraged Pt to actively use the R arm in functional activities to avoid frozen shoulder .  Pt will benefit from skilled OT to reduce swelling in the R breast and axilla and to limit lymphedema progression. Reducing lymphatic load will decrease swelling and associated discomfort. Rather than Intensive Phase Complete Decongestive Therapy (CDT), the gold standard for lymphedema care,  which includes compression bandaging, manual lymphatic drainage, skin care and ther ex, I recommend Pt undergo modified CDT 1 x weekly for 8-12 weeks and PRN.  Rather than wrap the extremity we'll fit Pt with a comfortable, off the shelf, prophylactic compression bra, or camisole and a prophylactic, ccl 1 ( 20-30 mmHg) arm sleeve.  If Pt opts out of XRT, we will provide manual therapy and teach Pt how to perform simple self MLD and ther ex as part of lymphedema self care home program.   Radiation therapy is an integral tool for treating certain cancers, as it extends disease-free survival, overall survival and reduces regional failure; however, it is known that Radiation Therapy increases the risk of lymphedema through multiple avenues.  It is therefore very beneficial to provide CDT throughout and after RT to limit lymphatic overload by utilizing alternative pathways, and to train patients and caregivers to perform daily lymphedema self-care. If Karina [name] participates in XRT we can provide MLD to watersheds adjacent to the radiation field to reduce the lymphatic overload to the affected quadrant during and after treatment to reduce risk of  progression and provide relief from discomfort. At her first OT RX visit we'll  complete initial comparative limb volumetric measurements of bilateral upper extremities and teach lymphatic pumping ther ex. We'll monitor shoulder AROM and address PRN. Pt will learn lymphedema precautions and prevention strategies. Without skilled OT for lymphedema care, Pt's condition may progress and further functional decline is likely.    OBJECTIVE IMPAIRMENTS: decreased balance, decreased knowledge of condition, decreased knowledge of use of DME, increased edema, increased fascial restrictions, postural dysfunction, and pain.   ACTIVITY LIMITATIONS: carrying, lifting, pushing, pulling, urinary continence, caring for others  PARTICIPATION LIMITATIONS: cleaning  PERSONAL FACTORS: Behavior pattern, Education, Fitness, Past/current experiences, and 3+ comorbidities: anxiety disorder, arthralgia, R rotator cuff syndrome, thoracic back pain, RA, Karina are also affecting patient's functional outcome.   REHAB POTENTIAL: Good  EVALUATION COMPLEXITY: Moderate  GOALS: Goals reviewed with patient? Yes  SHORT TERM GOALS: Target date: 4th OT Rx visit    1. Pt will demonstrate understanding of lymphedema precautions and prevention strategies with moderate assistance using a printed reference to identify at least 5 precautions and discussing how s/he may implement them into daily life to reduce risk of progression with extra time. Baseline: Dependent Goal status:  INITIAL  2. Pt will obtain recommended compression garments, and be able to don and doff prophylactic compression arm sleeve and compression bra with modified independence using assistive devices PRN for highest level of independence with LE self care home program. Baseline: Dependent\ Goal status: INITIAL  LONG TERM GOALS: Target date: 04/17/24  1.Given this patient's Intake score of TBD % on the Lymphedema Life Impact Scale (LLIS), patient will experience a  reduction of at least 5 points in her perceived level of functional impairment resulting from lymphedema to improve functional performance and quality of life (QOL). Baseline: TBD % Goal status: INITIAL  2. Pt will be able to perform all LYMPHEDEMA SELF CARE HOME PROGRAM components using printed references and extra time (modified independence) by DC to limit LE progression and to retain clinical gains.  .Baseline: Dependent Goal status: INITIAL  3. Pt will achieve at least 85% compliance with all adapted lymphedema self-care home program components, including daily skin care, prophylactic compression,  simple self MLD and lymphatic pumping therex to habituate LE self care protocol  into ADLs for optimal LE self-management over time. Baseline: Dependent Goal status: INITIAL  PLAN:  OT FREQUENCY: 1x/week  PT DURATION: 8- 12 weeks, and PRN  PLANNED INTERVENTIONS: 97110-Therapeutic exercises, 97530- Therapeutic activity, [id number]- Neuromuscular re-education, 97535- Self Care, [id number]- Manual therapy, Patient/Family education, Taping, Manual lymph drainage, Scar mobilization, and DME instructions; skin care,   PLAN FOR NEXT SESSION: Bilateral comparative limb volumetrics; Pt to complete LLIS, MLD  Zebedee Dec, Karina, OTR/L, CLT-LANA 01/19/24 3:12 PM

## 2024-01-19 ENCOUNTER — Ambulatory Visit

## 2024-01-19 ENCOUNTER — Encounter: Payer: Self-pay | Admitting: Occupational Therapy

## 2024-01-19 NOTE — Telephone Encounter (Signed)
 Thank you :)

## 2024-01-19 NOTE — Telephone Encounter (Signed)
 Called Pt , Pt Prefer In-person Visit appt Scheduled

## 2024-01-20 ENCOUNTER — Ambulatory Visit

## 2024-01-20 ENCOUNTER — Inpatient Hospital Stay: Attending: Hematology

## 2024-01-20 NOTE — Progress Notes (Signed)
 [facility] CSW Progress Note  Clinical Social Work Intern contacted patient by phone to follow-up on need for community resources and emotional support. Patient reported last month she may need transportation assistance, today pt denies transportation concerns. Patient very concerned about how radiation treatment may negatively impact her MS due to her own findings online, and has an appointment on Monday to speak with provider.     Interventions: Provided brief mental health counseling with regard to treatment related concerns. Intern affirmed patient's desire to make a fully informed decision regarding next steps for treatment, and advocating for herself by meeting with Dr. Vear who treats her MS before beginning radiation tx.       Follow Up Plan:  Patient will contact CSW with any support or resource needs; patient is still in touch with [employer] peer mentor and knows she can reach out to her as needed.    Thersia KATHEE Daring Clinical Social Work Intern [facility]

## 2024-01-23 ENCOUNTER — Ambulatory Visit (INDEPENDENT_AMBULATORY_CARE_PROVIDER_SITE_OTHER): Admitting: Neurology

## 2024-01-23 ENCOUNTER — Ambulatory Visit

## 2024-01-23 ENCOUNTER — Encounter: Payer: Self-pay | Admitting: Neurology

## 2024-01-23 ENCOUNTER — Ambulatory Visit: Admitting: Occupational Therapy

## 2024-01-23 VITALS — BP 120/75 | HR 82 | Ht 60.0 in | Wt 175.5 lb

## 2024-01-23 DIAGNOSIS — R5383 Other fatigue: Secondary | ICD-10-CM

## 2024-01-23 DIAGNOSIS — G35C0 Secondary progressive multiple sclerosis, unspecified: Secondary | ICD-10-CM | POA: Diagnosis not present

## 2024-01-23 DIAGNOSIS — G4733 Obstructive sleep apnea (adult) (pediatric): Secondary | ICD-10-CM | POA: Diagnosis not present

## 2024-01-23 DIAGNOSIS — R3915 Urgency of urination: Secondary | ICD-10-CM | POA: Diagnosis not present

## 2024-01-23 DIAGNOSIS — R413 Other amnesia: Secondary | ICD-10-CM | POA: Diagnosis not present

## 2024-01-23 DIAGNOSIS — C50911 Malignant neoplasm of unspecified site of right female breast: Secondary | ICD-10-CM | POA: Diagnosis not present

## 2024-01-23 MED ORDER — LAMOTRIGINE 25 MG PO TABS: ORAL_TABLET | ORAL | 3 refills | Status: DC

## 2024-01-23 NOTE — Progress Notes (Signed)
 [facility]  PATIENT: Karina [name] DOB: [birth year]  REFERRING DOCTOR OR PCP: Laine Balls MD SOURCE: Patient, notes from [facility] neurology, imaging and lab reports, MRI images personally reviewed.  _________________________________   HISTORICAL  CHIEF COMPLAINT:  Chief Complaint  Patient presents with   Follow-up    Pt in room 10. Alone.Here for MS follow up. Breast diagnoses with breast cancer in Sept. Wants to discuss radiation.     HISTORY OF PRESENT ILLNESS:  I had the pleasure of seeing Karina [name] at [facility] for her multiple sclerosis.  She is a 68 year old woman who was diagnosed with MS in 1996.  She has not been on any disease modifying therapy since 2012.  Update 01/23/2024: She is not on any DMT.  She denies exacerbation or maor new sympoms.   She was recently diagnosed with invasive lobular carcinoma.   She is concerned her MS may do worse with RadRx (had one positive LN at surgery).  We discussed that RadRx is not ontraindicated in MS.   .  She can walk > 1/2 mile without a rest.  No falls.   She feels slightly weaker than a couple years ago.She feels her muscles tire out quickly.  She has spasms, cramps and myalgias.    She has tingling in her fingertips.  .    She has had reduce vision OD for many years.   She feels new glasses have helped the right vision some but she has a  ' football shape' astigmatism.     She denies difficulty with color vision.   No diplopia.      She has stress incontinence so wears pads.   She has never bene on a medication for her bladder.  Unchanged compared to 2024.  She has fatigue.  Mood is fine.   She feels cognition is mildly impared with mild reduced STM, especially names.   She has insomnia, helped only a bit by gabapentin .    She was diagnosed with OSA but insurance did not cover CPAP as AHI only 4% using 4% rule.     She notes a mild tremor in her hands  She has been unable to do an  MRI.  as a lot of phlegm and has had difficulty laying down for the MRI. The cage coil made it difficult for her to do and she pulle dout quickly before imageing could be done last 2 times.      MS History: She was diagnosed in 1996 after presenting with syncope.   She had an MRI that was c/w MS and then had a LP.   CSF was reportedly c/w MS.  She saw Dr. Maurice initially and was placed on Betaseron.  She had a lot of knots in her skin so went on Tecfidera aroud 2012 but felt weaker and had reduced BP so it was stopped.   She has not been on a DMT since.  Sleep: Sleep study 07/26/2023 showed Moderate OSA with a pAHI 3% of 16.8/hr on ule but only 4/hr on 45 rule       Mild nocturnal hypoxemia with 11.9 minutes below SaO2 of 88%.     REVIEW OF SYSTEMS: Constitutional: No fevers, chills, sweats, or change in appetite Eyes: No visual changes, double vision, eye pain Ear, nose and throat: No hearing loss, ear pain, nasal congestion, sore throat Cardiovascular: No chest pain, palpitations Respiratory:  No shortness of breath at rest or with exertion.   No  wheezes GastrointestinaI: No nausea, vomiting, diarrhea, abdominal pain, fecal incontinence Genitourinary:  No dysuria, urinary retention or frequency.  No nocturia. Musculoskeletal:  No neck pain, back pain Integumentary: No rash, pruritus, skin lesions Neurological: as above Psychiatric: No depression at this time.  No anxiety Endocrine: No palpitations, diaphoresis, change in appetite, change in weigh or increased thirst Hematologic/Lymphatic:  No anemia, purpura, petechiae. Allergic/Immunologic: No itchy/runny eyes, nasal congestion, recent allergic reactions, rashes  ALLERGIES: Allergies  Allergen Reactions   Atorvastatin Other (See Comments)    elevated LFT's   Percocet [Oxycodone-Acetaminophen ] Shortness Of Breath    Felt like going to pass out and sweating   Tecfidera [Dimethyl Fumarate] Shortness Of Breath, Palpitations, Rash and  Cough   Amitriptyline Hcl Itching and Swelling   Betaseron [Interferon Beta-1b] Other (See Comments)    Headache   Duloxetine Other (See Comments)    pain   Pregabalin Other (See Comments)    LE swelling   Zoloft  [Sertraline  Hcl] Other (See Comments)    Headaches    Crestor  [Rosuvastatin ] Other (See Comments)    Muscle aches and cramps to generic crestor  and other statins   Clarithromycin Other (See Comments)    reaction not known   Levofloxacin Rash   Pseudoephedrine Other (See Comments)    legs hurt    HOME MEDICATIONS:  Current Outpatient Medications:    albuterol  (VENTOLIN  HFA) 108 (90 Base) MCG/ACT inhaler, INHALE 2 PUFFS UP TO EVERY 4 HOURS AS NEEDED FOR WHEEZING, Disp: 3 each, Rfl: 3   ALPRAZolam  (XANAX ) 0.5 MG tablet, Take 1 tablet (0.5 mg total) by mouth 2 (two) times daily as needed. For anxiety., Disp: 30 tablet, Rfl: 0   aspirin  EC 81 MG tablet, Take 1 tablet (81 mg total) by mouth daily. Swallow whole., Disp: 90 tablet, Rfl: 3   budesonide -formoterol  (SYMBICORT ) 160-4.5 MCG/ACT inhaler, Inhale 2 puffs into the lungs 2 (two) times daily. (Patient taking differently: Inhale 2 puffs into the lungs 2 (two) times daily as needed.), Disp: 3 each, Rfl: 1   cholecalciferol (VITAMIN D3) 25 MCG (1000 UNIT) tablet, Take 1,000 Units by mouth daily., Disp: , Rfl:    famotidine  (PEPCID ) 20 MG tablet, TAKE 1 TABLET (20 MG TOTAL) BY MOUTH DAILY. TAKE BEFORE DINNER, Disp: 90 tablet, Rfl: 2   fluticasone  (FLONASE ) 50 MCG/ACT nasal spray, Place 2 sprays into both nostrils daily. (Patient taking differently: Place 2 sprays into both nostrils daily as needed.), Disp: 16 g, Rfl: 11   gabapentin  (NEURONTIN ) 300 MG capsule, TAKE 2 CAPSULES BY MOUTH AT BEDTIME., Disp: 180 capsule, Rfl: 1   ipratropium-albuterol  (DUONEB) 0.5-2.5 (3) MG/3ML SOLN, TAKE 3 MLS BY NEBULIZATION EVERY 6 (SIX) HOURS AS NEEDED., Disp: 360 mL, Rfl: 0   lamoTRIgine (LAMICTAL) 25 MG tablet, For one week take one pill po every  day, then for one week take one pill bid, then take one pill po qAM and 2 pills po qHS, Disp: 90 tablet, Rfl: 3   methylcellulose (CITRUCEL) oral powder, 1 tsp daily, Disp: , Rfl:    Multiple Vitamin (MULTIVITAMIN) tablet, Take 1 tablet by mouth daily., Disp: , Rfl:    nicotine (NICODERM CQ - DOSED IN MG/24 HOURS) 21 mg/24hr patch, Place 21 mg onto the skin daily., Disp: , Rfl:    pantoprazole  (PROTONIX ) 40 MG tablet, Take 1 tablet (40 mg total) by mouth daily before breakfast., Disp: , Rfl:    rosuvastatin  (CRESTOR ) 10 MG tablet, Take 1 tablet (10 mg total) by mouth daily., Disp:  90 tablet, Rfl: 3   Spacer/Aero-Holding Chambers (AEROCHAMBER MV) inhaler, Use as instructed, Disp: 1 each, Rfl: 0   traMADol  (ULTRAM ) 50 MG tablet, Take 1 tablet (50 mg total) by mouth every 6 (six) hours as needed., Disp: 15 tablet, Rfl: 0  PAST MEDICAL HISTORY: Past Medical History:  Diagnosis Date   Allergic rhinitis    Allergy    Anxiety    [facility]- [facility], no per pt   Asthma    Breast cancer [facility])    Sept 2025   Cataract    Colon polyps 08/2008   Adenomatous   Complication of anesthesia    violent when waking up   COPD (chronic obstructive pulmonary disease) (HCC)    Depression    no per pt   Diabetes mellitus    Gastritis 08/2008   H. pylori   GERD (gastroesophageal reflux disease)    Hypercholesteremia    Hypercholesteremia    Lung nodule    Menopausal disorder    Multiple sclerosis    Neuromuscular disorder (HCC)    Neuropathy    Plantar fasciitis    Seizures (HCC)    1 seizure in 2001 due to blood sugar dropping to 38, none since   Shingles    Left V1   Ulcerative colitis (HCC)    Urinary incontinence    Vertigo     PAST SURGICAL HISTORY: Past Surgical History:  Procedure Laterality Date   BREAST BIOPSY Right 10/07/2023   US  RT BREAST BX W LOC DEV 1ST LESION IMG BX SPEC US  GUIDE 10/07/2023 [facility]   BREAST BIOPSY  11/25/2023   US  RT RADIOACTIVE SEED LOC 11/25/2023  [facility]   BREAST LUMPECTOMY WITH RADIOACTIVE SEED AND SENTINEL LYMPH NODE BIOPSY Right 11/25/2023   Procedure: BREAST LUMPECTOMY WITH RADIOACTIVE SEED AND SENTINEL LYMPH NODE BIOPSY;  Surgeon: Curvin Mt III, MD;  Location: [facility];  Service: General;  Laterality: Right;  GEN w/PEC BLOCK RIGHT BREAST RADIOACTIVE SEED LOCALIZED central LUMPECTOMY SENTINEL NODE BIOPSY   CATARACT EXTRACTION Bilateral 2023   CERVICAL DISCECTOMY  03/08/2002   x 2, Anterior; Fusion C4-5, C5-6, C6-7, Dr. Arley Helling   CHEST CT  11/06/2008   With small 4 mm nodule L lung base (rec re check in 1 year)   CHEST CT  07/06/2009   Re check chest CT - lung nodule stable   CHOLECYSTECTOMY     COLONOSCOPY  08/06/2008   Polyps/ re check 5 yrs   CT SINUS LTD W/O CM  02/05/2009   negative   DILATION AND CURETTAGE OF UTERUS     ESOPHAGOGASTRODUODENOSCOPY  08/06/2008   Erosive gastritis, h pylori (treated)   FOOT SURGERY     left plantar fascial problem   ROTATOR CUFF REPAIR     right   TONSILLECTOMY     TUBAL LIGATION     UPPER GASTROINTESTINAL ENDOSCOPY      FAMILY HISTORY: Family History  Problem Relation Age of Onset   Migraines Mother    Heart attack Mother    Coronary artery disease Mother    Coronary artery disease Father        6 bypasses    Stroke Father    Diabetes Father    Renal Disease Father    Colon cancer Maternal Grandfather 35   Coronary artery disease Paternal Grandmother    Lung cancer Paternal Aunt    Coronary artery disease Cousin    Multiple sclerosis Neg Hx  Esophageal cancer Neg Hx    Rectal cancer Neg Hx    Stomach cancer Neg Hx    Breast cancer Neg Hx     SOCIAL HISTORY: Social History   Socioeconomic History   Marital status: Married    Spouse name: Not on file   Number of children: 3   Years of education: 9 th   Highest education level: Not on file  Occupational History   Occupation: Disabled     Employer: DISABLED  Tobacco Use    Smoking status: Former    Current packs/day: 0.00    Average packs/day: 2.0 packs/day for 30.0 years (60.0 ttl pk-yrs)    Types: Cigarettes    Start date: 05/07/1991    Quit date: 05/06/2021    Years since quitting: 2.7   Smokeless tobacco: Never  Vaping Use   Vaping status: Never Used  Substance and Sexual Activity   Alcohol use: No    Alcohol/week: 0.0 standard drinks of alcohol   Drug use: No    Comment: last smoked 11/16/23   Sexual activity: Not on file  Other Topics Concern   Not on file  Social History Narrative   Married with 3 children   Disabled.   Education 9th grade    Caffeine one cup of coffee daily.   Patient is right handed.    Social Drivers of Corporate Investment Banker Strain: Low Risk  (09/30/2022)   Overall Financial Resource Strain (CARDIA)    Difficulty of Paying Living Expenses: Not hard at all  Food Insecurity: No Food Insecurity (12/26/2023)   Hunger Vital Sign    Worried About Running Out of Food in the Last Year: Never true    Ran Out of Food in the Last Year: Never true  Transportation Needs: No Transportation Needs (12/26/2023)   PRAPARE - Administrator, Civil Service (Medical): No    Lack of Transportation (Non-Medical): No  Physical Activity: Inactive (09/30/2022)   Exercise Vital Sign    Days of Exercise per Week: 0 days    Minutes of Exercise per Session: 0 min  Stress: Stress Concern Present (09/30/2022)   [employer] of Occupational Health - Occupational Stress Questionnaire    Feeling of Stress : Very much  Social Connections: Moderately Isolated (09/30/2022)   Social Connection and Isolation Panel    Frequency of Communication with Friends and Family: Never    Frequency of Social Gatherings with Friends and Family: Never    Attends Religious Services: More than 4 times per year    Active Member of [employer] or Organizations: No    Attends Banker Meetings: Never    Marital Status: Married  Catering Manager  Violence: Not At Risk (12/26/2023)   Humiliation, Afraid, Rape, and Kick questionnaire    Fear of Current or Ex-Partner: No    Emotionally Abused: No    Physically Abused: No    Sexually Abused: No       PHYSICAL EXAM  Vitals:   01/23/24 1520  BP: 120/75  Pulse: 82  SpO2: 97%  Weight: 175 lb 8 oz (79.6 kg)  Height: 5' (1.524 m)    Body mass index is 34.27 kg/m.   General: The patient is well-developed and well-nourished and in no acute distress  HEENT:  Head is [place]/AT.  Sclera are anicteric.   Neck: No carotid bruits are noted.  The neck is nontender.  Cardiovascular: The heart has a regular rate and rhythm with a  normal S1 and S2. There were no murmurs, gallops or rubs.    Skin: Extremities are without rash or  edema.  Neurologic Exam  Mental status: The patient is alert and oriented x 3 at the time of the examination. The patient has apparent normal recent and remote memory, with an apparently normal attention span and concentration ability.   Speech is normal.  Cranial nerves: Extraocular movements are full.  Color vision was symmetric.  Facial symmetry is present. There is good facial sensation to soft touch bilaterally.Facial strength is normal.  Trapezius and sternocleidomastoid strength is normal. No dysarthria is noted.  The tongue is midline, and the patient has symmetric elevation of the soft palate. No obvious hearing deficits are noted.  Motor:  Muscle bulk is normal.   Tone is mildly increased in the legs. Strength is  5 / 5 in all 4 extremities.   Sensory: She reported mildly reduced sensation to vibration in the right knee relative to the left knee.  Coordination: Cerebellar testing reveals good finger-nose-finger and heel-to-shin bilaterally.  Gait and station: Station is normal.   Her gait is mildly wide with a slightly reduced stride.  The tandem gait is moderately wide. Romberg is negative.   Reflexes: Deep tendon reflexes are symmetric and normal in  the arms and 3 at the knees.  Plantar responses are flexor.    DIAGNOSTIC DATA (LABS, IMAGING, TESTING) - I reviewed patient records, labs, notes, testing and imaging myself where available.  Lab Results  Component Value Date   WBC 7.3 10/19/2023   HGB 14.3 10/19/2023   HCT 43.7 10/19/2023   MCV 93.6 10/19/2023   PLT 242 10/19/2023      Component Value Date/Time   NA 138 10/19/2023 1230   NA 139 07/07/2023 1146   NA 136 07/09/2013 1803   K 4.7 10/19/2023 1230   K 4.2 07/09/2013 1803   CL 101 10/19/2023 1230   CL 105 07/09/2013 1803   CO2 33 (H) 10/19/2023 1230   CO2 22 07/09/2013 1803   GLUCOSE 89 10/19/2023 1230   GLUCOSE 96 07/09/2013 1803   BUN 10 10/19/2023 1230   BUN 10 07/07/2023 1146   BUN 16 07/09/2013 1803   CREATININE 0.98 10/19/2023 1230   CREATININE 1.20 (H) 04/20/2019 1444   CALCIUM  9.7 10/19/2023 1230   CALCIUM  9.6 07/09/2013 1803   PROT 7.3 10/19/2023 1230   PROT 7.3 07/07/2023 1146   ALBUMIN 4.3 10/19/2023 1230   ALBUMIN 4.6 07/07/2023 1146   AST 16 10/19/2023 1230   ALT 12 10/19/2023 1230   ALKPHOS 77 10/19/2023 1230   BILITOT 0.4 10/19/2023 1230   GFRNONAA >60 10/19/2023 1230   GFRNONAA 55 (L) 07/09/2013 1803   GFRAA >60 08/22/2019 1602   GFRAA >60 07/09/2013 1803   Lab Results  Component Value Date   CHOL 267 (H) 07/26/2022   HDL 77.10 07/26/2022   LDLCALC 170 (H) 07/26/2022   LDLDIRECT 206.5 01/24/2013   TRIG 101.0 07/26/2022   CHOLHDL 3 07/26/2022   Lab Results  Component Value Date   HGBA1C 5.8 07/26/2022   Lab Results  Component Value Date   VITAMINB12 340 07/07/2023   Lab Results  Component Value Date   TSH 1.230 07/07/2023       ASSESSMENT AND PLAN  Secondary progressive multiple sclerosis  Memory disturbance  OSA (obstructive sleep apnea)  Other fatigue  Urinary urgency  Malignant neoplasm of right female breast, unspecified estrogen receptor status, unspecified site of breast (  HCC)   She will remain off  of any disease modifying therapy.  She prefers to hold on the MRi as she is claustrophobic and finds it uncomfortable to do the MRI.  She might reconsider next year.and if breakthrough activity is occurring she may need to go back on one of the medications.  Will be following up with oncology for her breast cancer.   Neurologically cleared for RadRx if needed She never took the modafinil  but feels fatigue is milder now than last year. She will return to see the office in 12 months or sooner if there are new or worsening neurologic symptoms.   This visit is part of a comprehensive longitudinal care medical relationship regarding the patients primary diagnosis of multiple sclerosis and related concerns.   Glendal Cassaday A. Vear, MD, [facility] 01/23/2024, 8:27 PM Certified in Neurology, Clinical Neurophysiology, Sleep Medicine and Neuroimaging  [facility] [address] [place], [place] [address] [phone]

## 2024-01-24 ENCOUNTER — Ambulatory Visit

## 2024-01-24 ENCOUNTER — Ambulatory Visit: Admission: RE | Admit: 2024-01-24 | Source: Ambulatory Visit | Admitting: Radiation Oncology

## 2024-01-25 ENCOUNTER — Ambulatory Visit

## 2024-01-26 ENCOUNTER — Ambulatory Visit

## 2024-01-26 ENCOUNTER — Ambulatory Visit: Admitting: Occupational Therapy

## 2024-01-27 ENCOUNTER — Ambulatory Visit

## 2024-01-30 ENCOUNTER — Ambulatory Visit

## 2024-01-30 ENCOUNTER — Encounter: Payer: Self-pay | Admitting: Occupational Therapy

## 2024-01-30 ENCOUNTER — Encounter: Payer: Self-pay | Admitting: Radiology

## 2024-01-30 ENCOUNTER — Ambulatory Visit: Admitting: Occupational Therapy

## 2024-01-30 DIAGNOSIS — I972 Postmastectomy lymphedema syndrome: Secondary | ICD-10-CM

## 2024-01-30 DIAGNOSIS — Z170 Estrogen receptor positive status [ER+]: Secondary | ICD-10-CM | POA: Diagnosis not present

## 2024-01-30 DIAGNOSIS — C50511 Malignant neoplasm of lower-outer quadrant of right female breast: Secondary | ICD-10-CM | POA: Diagnosis not present

## 2024-01-30 NOTE — Therapy (Unsigned)
 OUTPATIENT OCCUPATIONAL THERAPY  EVALUATION  RIGHT UPPER EXTREMITY/ UPPER QUADRANT, POST MASTECTOMY LYMPHEDEMA  Patient Name: Karina [name] MRN: [id number] DOB:27-Dec-1955, 68 y.o., female Today's Date: 01/31/2024  END OF SESSION:   OT End of Session - 01/30/24 1422     Visit Number 2    Number of Visits 36    Date for Recertification  04/17/24    OT Start Time 0200    OT Stop Time 0310    OT Time Calculation (min) 70 min    Activity Tolerance Patient tolerated treatment well;No increased pain    Behavior During Therapy Karina [name] for tasks assessed/performed            Past Medical History:  Diagnosis Date   Allergic rhinitis    Allergy    Anxiety    Councelor- Karina [name], no per pt   Asthma    Breast cancer [name])    Sept 2025   Cataract    Colon polyps 08/2008   Adenomatous   Complication of anesthesia    violent when waking up   COPD (chronic obstructive pulmonary disease) (HCC)    Depression    no per pt   Diabetes mellitus    Gastritis 08/2008   H. pylori   GERD (gastroesophageal reflux disease)    Hypercholesteremia    Hypercholesteremia    Lung nodule    Menopausal disorder    Multiple sclerosis    Neuromuscular disorder (HCC)    Neuropathy    Plantar fasciitis    Seizures (HCC)    1 seizure in 2001 [name] to blood sugar dropping to 38, none since   Shingles    Left V1   Ulcerative colitis (HCC)    Urinary incontinence    Vertigo    Past Surgical History:  Procedure Laterality Date   BREAST BIOPSY Right 10/07/2023   US  RT BREAST BX W LOC DEV 1ST LESION IMG BX SPEC US  GUIDE 10/07/2023 [facility]   BREAST BIOPSY  11/25/2023   US  RT RADIOACTIVE SEED LOC 11/25/2023 [facility]   BREAST LUMPECTOMY WITH RADIOACTIVE SEED AND SENTINEL LYMPH NODE BIOPSY Right 11/25/2023   Procedure: BREAST LUMPECTOMY WITH RADIOACTIVE SEED AND SENTINEL LYMPH NODE BIOPSY;  Surgeon: Karina Mt III, MD;  Location: [name];  Service: General;   Laterality: Right;  GEN w/PEC BLOCK RIGHT BREAST RADIOACTIVE SEED LOCALIZED central LUMPECTOMY SENTINEL NODE BIOPSY   CATARACT EXTRACTION Bilateral 2023   CERVICAL DISCECTOMY  03/08/2002   x 2, Anterior; Fusion C4-5, C5-6, C6-7, Dr. Arley Helling   CHEST CT  11/06/2008   With small 4 mm nodule L lung base (rec re check in 1 year)   CHEST CT  07/06/2009   Re check chest CT - lung nodule stable   CHOLECYSTECTOMY     COLONOSCOPY  08/06/2008   Polyps/ re check 5 yrs   CT SINUS LTD W/O CM  02/05/2009   negative   DILATION AND CURETTAGE OF UTERUS     ESOPHAGOGASTRODUODENOSCOPY  08/06/2008   Erosive gastritis, h pylori (treated)   FOOT SURGERY     left plantar fascial problem   ROTATOR CUFF REPAIR     right   TONSILLECTOMY     TUBAL LIGATION     UPPER GASTROINTESTINAL ENDOSCOPY     Patient Active Problem List   Diagnosis Date Noted   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   Malignant neoplasm of lower-outer quadrant of right breast of female, estrogen receptor positive (HCC) 10/17/2023  Lump of breast, right 09/30/2023   Bilateral hip pain 09/30/2023   Depressed mood 10/04/2022   Left flank pain 07/31/2022   Petechiae 07/26/2022   Globus sensation 07/26/2022   Occult blood positive stool 05/25/2021   Colon cancer screening 05/07/2021   Estrogen deficiency 05/07/2021   Rheumatoid arthritis (HCC) 03/31/2021   Coronary artery calcification 03/31/2021   Hypokalemia 08/27/2020   Joint pain in both hands 10/12/2019   Head pain 08/22/2019   Elevated serum creatinine 04/24/2019   Frequent urination 04/20/2019   Decreased appetite 04/20/2019   Encounter for screening mammogram for breast cancer 04/20/2019   Seborrheic keratosis, inflamed 12/15/2017   B12 deficiency 08/24/2017   Left low back pain 08/24/2017   Erosive osteoarthritis of both hands 09/09/2016   Fatigue 09/07/2016   Bilateral hand pain 09/07/2016   Routine general medical examination at a health care  facility 05/06/2015   Large breasts 05/06/2015   Lumbar disc disease 11/01/2014   Knee pain, bilateral 03/11/2014   Thoracic back pain 11/30/2013   Encounter for routine gynecological examination 01/31/2013   Encounter for Medicare annual wellness exam 01/23/2013   Chest pain 09/20/2012   Dyspnea 09/13/2012   Asthmatic bronchitis , chronic (HCC) 12/06/2011   Chronic cough 11/22/2011   Anxiety disorder 07/17/2009   HEMORRHOIDS-EXTERNAL 11/07/2008   IRRITABLE BOWEL SYNDROME 11/07/2008   Lung nodule 10/14/2008   Prediabetes 10/14/2008   History of colonic polyps 10/08/2008   Vitamin D  deficiency 07/23/2008   SYNCOPE, HX OF 06/01/2008   FOOT SURGERY, HX OF 06/01/2008   DILATION AND CURETTAGE, HX OF 06/01/2008   OSTEOARTHRITIS, SHOULDER, RIGHT 03/22/2008   ROTATOR CUFF SYNDROME, RIGHT 03/22/2008   ARTHRALGIA 12/12/2007   Allergic rhinitis 08/01/2007   Former smoker 10/17/2006   Peripheral neuropathy 10/17/2006   FOOT PAIN, BILATERAL 10/17/2006   HYPOGLYCEMIA 09/30/2006   HYPERCHOLESTEROLEMIA 09/30/2006   MULTIPLE SCLEROSIS 09/30/2006   GERD 09/30/2006   FIBROCYSTIC BREAST DISEASE 09/30/2006    PCP: Karina DELENA Balls, MD  REFERRING PROVIDER: Leeroy MARGHERITA Due, PA-C  REFERRING DIAG: 197.2  THERAPY DIAG:  Post-mastectomy lymphedema syndrome  ONSET DATE: 11/25/23  Rationale for Evaluation and Treatment: Rehabilitation  SUBJECTIVE                                                                                                                                                                                           SUBJECTIVE STATEMENT: Karina [name] returns to OT for  R postmastectomy, lymphedema care. Pt reports during visit interval she saw her neurologist who cleared her for XRT. Pt states she has decided not to take radiation . Pt agreeable to initial  comparative limb volumetrics. She states, I've always had one side bigger than the other, all my life.  (01/20/24 OT Initial Eval:  Karina [name] is referred to Occupational Therapy by Karina Due, PA-C for evaluation and treatment of R postmastectomy lymphedema. Pt reports swelling and discomfort in her breast and axilla have not resolved since surgery. She complains of numbness and tingling in the R breast, axilla and proximal posterior arm.  Pt reports a sensation that there are knots under her skin along incision scars. She complains of deep axillary pain intermittently. Pt does not have a prophylactic compression arm sleeve , and she reports she was unable to tolerate a compression bra after surgery because it was too tight. Pt reports she has decided not to take radiation because she is afraid it might kill her because she has MS. I did a bunch of research on line and it is possible. OT encouraged Pt to speak with her oncology team ASAP to be certain she is making informed choices.)  PERTINENT HISTORY:  R BREAST LUMPECTOMY WITH RADIOACTIVE SEED AND SENTINEL LYMPH NODE BIOPSY. 1+/2 LN. 11/25/2023  Asthma,COPD, Depression, Diabetes, Lung nodule MS, Neuromuscular disorder, neuropathy, Plantar fasciitis, Seizures, Ulcerative colitis, Urinary incontinence, former smoker' anxiety disorder, arthralgia, R rotator cuff syndrome, thoracic back pain, RA,   PAIN:  Are you having pain? No NPRS scale: 0/10 Pain location: RUE/RUQ PAIN TYPE: numbness, stiffness Pain description: fullness, heaviness  Aggravating factors: housecleaning, washing clothes, cooking supper Relieving factors: rest breaks  PRECAUTIONS: Fall  WEIGHT BEARING RESTRICTIONS: No  FALLS:  Has patient fallen in last 6 months? Yes. Number of falls 1 after episode of vertigo fell onto bed My balance has been off more.  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  COGNITION:  Overall cognitive status: Within functional limits for tasks assessed   OBSERVATIONS / OTHER ASSESSMENTS: STAGE 0, Subclinical, RUE/RUQ , Post mastectomy lymphedema  SENSATION: Light touch: Deficits   Stereognosis: Appears intact Hot/Cold: Appears intact Proprioception: Appears intact  POSTURE: Guarding, shoulder protraction and elevation; head forward w cervical kyphosis-appears flexible  HAND DOMINANCE: Right  UPPER EXTREMITY AROM/PROM: WFL bilaterally, all planes  CERVICAL AROM:     Flexion WFL  Extension WFL  Right lateral flexion WFL  Left lateral flexion WFL  Right rotation WFL  Left rotation WFL    UPPER EXTREMITY STRENGTH: WFL  SURGERY TYPE/DATE: 11/25/23 R lumpectomy with SLNB and radioactive seed placement  NUMBER OF LYMPH NODES REMOVED: 1+/2  CHEMOTHERAPY: none  RADIATION:pending  HORMONE TREATMENT: pending  INFECTIONS: denies seroma; denies cellulitis  LYMPHEDEMA ASSESSMENTS:  BLE COMPARATIVE LIMB VOLUMETRICS INITIAL 01/29/24  LANDMARK RIGHT  RX, Dominant  R circ at MPs 19.7 cm  Wrist crease from base of small finger nail 12 cm  RUE C-G 1649.1 ml  Limb Volume differential (LVD)  1.87%, L>R%  Volume change since last measured %  Volume change since initial %  (Blank rows = not tested)  LANDMARK LEFT   L circ at MPs 18.8 cm  R Wrist crease from base of  small finger nail 12 cm  LUE C-G 1680.0 ml  Limb Volume differential (LVD)  %  Volume change since last measured %  Volume change since initial %  (Blank rows = not tested)   GAIT: Distance walked: >500 ft Assistive device utilized: None Level of assistance: Modified independence and extra time- slow  pace   PATIENT SURVEYS:  Lymphedema Life Impact Scale (LLIS): TBA Rx   TREATMENT THIS DATE:  OT initial evaluation Pt and family edu  PATIENT EDUCATION:  Continued Pt/ CG edu for lymphedema self care home program throughout session. Topics include outcome of comparative limb volumetrics- starting limb volume differentials (LVDs), technology and gradient techniques used for short stretch, multilayer compression wrapping, simple self-MLD, therapeutic lymphatic pumping exercises, skin/nail care, LE precautions, compression garment recommendations and specifications, wear and care schedule and compression garment donning / doffing w assistive devices. Discussed progress towards all OT goals since commencing CDT. Discussed detrimental impact of obesity on lower and upper extremity lymphedema over time. Reviewed OT goals for lymphedema care with Pt and discussed progress to date.  All questions answered to the Pt's satisfaction. Good return. Person educated: Patient  Education method: Explanation, Demonstration, and Handouts Education comprehension: verbalized understanding, returned demonstration, verbal cues required, and needs further education   LYMPHEDEMA SELF-CARE HOME PROGRAM: lymphatic pumping there ex- 1 set of 10 each element, in order. Hold 5. 2 x daily  PROPHYLACTIC COMPRESSION ARM SLEEVE ( OFF THE SHELF, MEDICAL GRADE CCL 1 ( 20-30 MMhG)  3. Soft and comfortable compression bra, or camisole, that Pt can add a soft spot compression pad to PRN  By end of Intensive Phase CDT Pt should be fit with appropriate compression garments/ and/or devices to control swelling. In this case Off-the-shelf, medical grade, gradient compression arm sleeve and a compression bra are medically necessary to fit the dimensions of the affected extremity and breast, and to provide appropriate medical grade, graduated compression essential for optimally managing chronic, progressive lymphedema. Multiple  compression garments are needed to ensure proper hygiene to limit infection risk. Custom compression garments should be replaced q 3-6 months When worn consistently for optimal lymphedema self-management over time. HOS devices, medically necessary to limit fibrosis buildup in tissue, should be replaced q 2 years and PRN when worn out.    4. Daily simple self MLD used in conjunction with pneumatic device to engage thoracic duct return and to stimulate the terminus LNs in subclavian region.  5. Daily skin care with low ph lotion matching skin ph to reduce infection risk.  ASSESSMENT: CLINICAL IMPRESSION:  Initial BUE comparative limb volumetrics reveal a limb volume differential (LVD) that is WNL, with the LUE, non-dominant, non-affected limb 1.87% greater in volume than the RUE. (treatment limb, dominant).  Typically the dominant limb is larger, but not in this case. There is no palpable lymphedema in the arm, but tissue does feel fatty and spongy. We'll move forward with fitting a prophylactic arm sleeve e in keeping with lymphedema precautions, and will not utilize compression wraps. We'll measure the breast next session in consideration of a compression bra to improve comfort and support. Hopefully we'll have time for [facility] as well. Cont as per POC.   OTTILIE WIGGLESWORTH is a 68 yo female presenting with subclinical, stage 0, RUE/RUQ , post mastectomy lymphedema s/p R lumpectomy and SLNB ( 1+/2). Radiation is pending. But Pt reports today that she is planning to decline it because she is afraid it may make her MS worse, or  even cause her death. OT encouraged Pt to speak with her oncology team ASAP to ensure she is making informed decisions re her Rx.  The sentinel node technique is defined as the removal of no more than four lymph nodes, where the risk is about 6%.  If you have more than four lymph nodes removed, the risk rises to 15% to 25%Patients who have  axillary lymph node dissection have a lifetime  risk of 15-25% of developing lymphedema. For individuals whose surgery is limited to sentinel node  techniques, without adjuvant radiation, the risk is about 6%. radiation can traumatize the lymphatic system equivalently  to surgery, even if surgery is not done. However, just as all surgery is not alike, not all radiation therapy is the same. In general, radiation therapy confers risk that is roughly equivalent  to axillary lymph node dissection and increases the risk associated with surgery if both treatments are performed.   [email or web address]  Ms Pavlak presents with mild swelling of the R breast and R axilla. Palpable, nodular scar tissue is noted along well healed incision lines. Pt expresses concern about numbness of the R breast and posterior R arm and axilla.  Although we did not have time to complete comparative limb volumetrics today during the eval, we will on Rx visit 1. By visual assessment the upper extremities appear symmetrical and high protein, thickened lymphatic congestion is not palpable in the arm. Swelling is absent in the R trunk and scapula. Pt presents with full, BUE AROM without pain at end ranges. Pt does complain that she doesn't use the R arm much [name] to shoulder pain, which I suspect is stiffness 2/2 R rotator cuff syndrome. OT encouraged Pt to actively use the R arm in functional activities to avoid frozen shoulder .  Pt will benefit from skilled OT to reduce swelling in the R breast and axilla and to limit lymphedema progression. Reducing lymphatic load will decrease swelling and associated discomfort. Rather than Intensive Phase Complete Decongestive Therapy (CDT), the gold standard for lymphedema care,  which includes compression bandaging, manual lymphatic drainage, skin care and ther ex, I recommend Pt undergo modified CDT 1 x weekly for 8-12 weeks and PRN.  Rather than wrap the extremity we'll fit Pt with a comfortable, off the shelf,  prophylactic compression bra, or camisole and a prophylactic, ccl 1 ( 20-30 mmHg) arm sleeve.  If Pt opts out of XRT, we will provide manual therapy and teach Pt how to perform simple self MLD and ther ex as part of lymphedema self care home program.   Radiation therapy is an integral tool for treating certain cancers, as it extends disease-free survival, overall survival and reduces regional failure; however, it is known that Radiation Therapy increases the risk of lymphedema through multiple avenues.  It is therefore very beneficial to provide CDT throughout and after RT to limit lymphatic overload by utilizing alternative pathways, and to train patients and caregivers to perform daily lymphedema self-care. If Ms. Chea participates in XRT we can provide MLD to watersheds adjacent to the radiation field to reduce the lymphatic overload to the affected quadrant during and after treatment to reduce risk of progression and provide relief from discomfort. At her first OT RX visit we'll  complete initial comparative limb volumetric measurements of bilateral upper extremities and teach lymphatic pumping ther ex. We'll monitor shoulder AROM and address PRN. Pt will learn lymphedema precautions and prevention strategies. Without skilled OT for lymphedema care, Pt's condition may progress  and further functional decline is likely.    OBJECTIVE IMPAIRMENTS: decreased balance, decreased knowledge of condition, decreased knowledge of use of DME, increased edema, increased fascial restrictions, postural dysfunction, and pain.   ACTIVITY LIMITATIONS: carrying, lifting, pushing, pulling, urinary continence, caring for others  PARTICIPATION LIMITATIONS: cleaning  PERSONAL FACTORS: Behavior pattern, Education, Fitness, Past/current experiences, and 3+ comorbidities: anxiety disorder, arthralgia, R rotator cuff syndrome, thoracic back pain, RA, MS are also affecting patient's functional outcome.   REHAB POTENTIAL:  Good  EVALUATION COMPLEXITY: Moderate  GOALS: Goals reviewed with patient? Yes  SHORT TERM GOALS: Target date: 4th OT Rx visit    1. Pt will demonstrate understanding of lymphedema precautions and prevention strategies with moderate assistance using a printed reference to identify at least 5 precautions and discussing how s/he may implement them into daily life to reduce risk of progression with extra time. Baseline: Dependent Goal status: INITIAL  2. Pt will obtain recommended compression garments, and be able to don and doff prophylactic compression arm sleeve and compression bra with modified independence using assistive devices PRN for highest level of independence with LE self care home program. Baseline: Dependent\ Goal status: INITIAL  LONG TERM GOALS: Target date: 04/17/24  1.Given this patient's Intake score of TBD % on the Lymphedema Life Impact Scale (LLIS), patient will experience a reduction of at least 5 points in her perceived level of functional impairment resulting from lymphedema to improve functional performance and quality of life (QOL). Baseline: TBD % Goal status: INITIAL  2. Pt will be able to perform all LYMPHEDEMA SELF CARE HOME PROGRAM components using printed references and extra time (modified independence) by DC to limit LE progression and to retain clinical gains.  .Baseline: Dependent Goal status: INITIAL  3. Pt will achieve at least 85% compliance with all adapted lymphedema self-care home program components, including daily skin care, prophylactic compression,  simple self MLD and lymphatic pumping therex to habituate LE self care protocol  into ADLs for optimal LE self-management over time. Baseline: Dependent Goal status: INITIAL  PLAN:  OT FREQUENCY: 1x/week  PT DURATION: 8- 12 weeks, and PRN  PLANNED INTERVENTIONS: 97110-Therapeutic exercises, 97530- Therapeutic activity, [id number]- Neuromuscular re-education, 97535- Self Care, [id number]- Manual  therapy, Patient/Family education, Taping, Manual lymph drainage, Scar mobilization, and DME instructions; skin care,   PLAN FOR NEXT SESSION: Bilateral comparative limb volumetrics; Pt to complete LLIS, MLD  Zebedee Dec, MS, OTR/L, CLT-LANA 01/31/24 1:52 PM

## 2024-01-30 NOTE — Progress Notes (Signed)
 Ms. Karina [name] previously called our department to cancel her scheduled CT simulation. Today, I called the patient who confirmed she would not like to proceed with the recommended radiation due to concerns of possible side effects. She understands that she can call us  with any questions or concerns that may develop. Radiation follow-up PRN.     Leeroy Due, PA-C

## 2024-01-31 ENCOUNTER — Ambulatory Visit

## 2024-02-01 ENCOUNTER — Ambulatory Visit

## 2024-02-06 ENCOUNTER — Ambulatory Visit: Admitting: Radiation Oncology

## 2024-02-06 ENCOUNTER — Ambulatory Visit

## 2024-02-06 ENCOUNTER — Ambulatory Visit: Admitting: Occupational Therapy

## 2024-02-06 DIAGNOSIS — I972 Postmastectomy lymphedema syndrome: Secondary | ICD-10-CM | POA: Diagnosis present

## 2024-02-06 NOTE — Therapy (Signed)
 OUTPATIENT OCCUPATIONAL THERAPY  EVALUATION  RIGHT UPPER EXTREMITY/ UPPER QUADRANT, POST MASTECTOMY LYMPHEDEMA  Patient Name: Karina [name] MRN: [id number] DOB:August 26, 1955, 68 y.o., female Today's Date: 02/06/2024  END OF SESSION:   OT End of Session - 02/06/24 1516     Visit Number 3    Number of Visits 36    Date for Recertification  04/17/24    OT Start Time 0310    Activity Tolerance Patient tolerated treatment well;No increased pain    Behavior During Therapy [facility] for tasks assessed/performed            Past Medical History:  Diagnosis Date   Allergic rhinitis    Allergy    Anxiety    Councelor- Karina [name], no per pt   Asthma    Breast cancer [facility])    Sept 2025   Cataract    Colon polyps 08/2008   Adenomatous   Complication of anesthesia    violent when waking up   COPD (chronic obstructive pulmonary disease) (HCC)    Depression    no per pt   Diabetes mellitus    Gastritis 08/2008   H. pylori   GERD (gastroesophageal reflux disease)    Hypercholesteremia    Hypercholesteremia    Lung nodule    Menopausal disorder    Multiple sclerosis    Neuromuscular disorder (HCC)    Neuropathy    Plantar fasciitis    Seizures (HCC)    1 seizure in 2001 [name] to blood sugar dropping to 38, none since   Shingles    Left V1   Ulcerative colitis (HCC)    Urinary incontinence    Vertigo    Past Surgical History:  Procedure Laterality Date   BREAST BIOPSY Right 10/07/2023   US  RT BREAST BX W LOC DEV 1ST LESION IMG BX SPEC US  GUIDE 10/07/2023 [facility]   BREAST BIOPSY  11/25/2023   US  RT RADIOACTIVE SEED LOC 11/25/2023 [facility]   BREAST LUMPECTOMY WITH RADIOACTIVE SEED AND SENTINEL LYMPH NODE BIOPSY Right 11/25/2023   Procedure: BREAST LUMPECTOMY WITH RADIOACTIVE SEED AND SENTINEL LYMPH NODE BIOPSY;  Surgeon: Curvin Mt III, MD;  Location: [facility];  Service: General;  Laterality: Right;  GEN w/PEC BLOCK RIGHT BREAST RADIOACTIVE SEED  LOCALIZED central LUMPECTOMY SENTINEL NODE BIOPSY   CATARACT EXTRACTION Bilateral 2023   CERVICAL DISCECTOMY  03/08/2002   x 2, Anterior; Fusion C4-5, C5-6, C6-7, Dr. Arley Helling   CHEST CT  11/06/2008   With small 4 mm nodule L lung base (rec re check in 1 year)   CHEST CT  07/06/2009   Re check chest CT - lung nodule stable   CHOLECYSTECTOMY     COLONOSCOPY  08/06/2008   Polyps/ re check 5 yrs   CT SINUS LTD W/O CM  02/05/2009   negative   DILATION AND CURETTAGE OF UTERUS     ESOPHAGOGASTRODUODENOSCOPY  08/06/2008   Erosive gastritis, h pylori (treated)   FOOT SURGERY     left plantar fascial problem   ROTATOR CUFF REPAIR     right   TONSILLECTOMY     TUBAL LIGATION     UPPER GASTROINTESTINAL ENDOSCOPY     Patient Active Problem List   Diagnosis Date Noted   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   Malignant neoplasm of lower-outer quadrant of right breast of female, estrogen receptor positive (HCC) 10/17/2023   Lump of breast, right 09/30/2023   Bilateral hip pain 09/30/2023   Depressed  mood 10/04/2022   Left flank pain 07/31/2022   Petechiae 07/26/2022   Globus sensation 07/26/2022   Occult blood positive stool 05/25/2021   Colon cancer screening 05/07/2021   Estrogen deficiency 05/07/2021   Rheumatoid arthritis (HCC) 03/31/2021   Coronary artery calcification 03/31/2021   Hypokalemia 08/27/2020   Joint pain in both hands 10/12/2019   Head pain 08/22/2019   Elevated serum creatinine 04/24/2019   Frequent urination 04/20/2019   Decreased appetite 04/20/2019   Encounter for screening mammogram for breast cancer 04/20/2019   Seborrheic keratosis, inflamed 12/15/2017   B12 deficiency 08/24/2017   Left low back pain 08/24/2017   Erosive osteoarthritis of both hands 09/09/2016   Fatigue 09/07/2016   Bilateral hand pain 09/07/2016   Routine general medical examination at a health care facility 05/06/2015   Large breasts 05/06/2015   Lumbar disc disease  11/01/2014   Knee pain, bilateral 03/11/2014   Thoracic back pain 11/30/2013   Encounter for routine gynecological examination 01/31/2013   Encounter for Medicare annual wellness exam 01/23/2013   Chest pain 09/20/2012   Dyspnea 09/13/2012   Asthmatic bronchitis , chronic (HCC) 12/06/2011   Chronic cough 11/22/2011   Anxiety disorder 07/17/2009   HEMORRHOIDS-EXTERNAL 11/07/2008   IRRITABLE BOWEL SYNDROME 11/07/2008   Lung nodule 10/14/2008   Prediabetes 10/14/2008   History of colonic polyps 10/08/2008   Vitamin D  deficiency 07/23/2008   SYNCOPE, HX OF 06/01/2008   FOOT SURGERY, HX OF 06/01/2008   DILATION AND CURETTAGE, HX OF 06/01/2008   OSTEOARTHRITIS, SHOULDER, RIGHT 03/22/2008   ROTATOR CUFF SYNDROME, RIGHT 03/22/2008   ARTHRALGIA 12/12/2007   Allergic rhinitis 08/01/2007   Former smoker 10/17/2006   Peripheral neuropathy 10/17/2006   FOOT PAIN, BILATERAL 10/17/2006   HYPOGLYCEMIA 09/30/2006   HYPERCHOLESTEROLEMIA 09/30/2006   MULTIPLE SCLEROSIS 09/30/2006   GERD 09/30/2006   FIBROCYSTIC BREAST DISEASE 09/30/2006    PCP: Karina DELENA Balls, MD  REFERRING PROVIDER: Leeroy MAN Muir, PA-C  REFERRING DIAG: 197.2  THERAPY DIAG:  No diagnosis found.  ONSET DATE: 11/25/23  Rationale for Evaluation and Treatment: Rehabilitation  SUBJECTIVE                                                                                                                                                                                           SUBJECTIVE STATEMENT: Karina [name] returns to OT for  R postmastectomy, lymphedema care. Pt states she has decided not to take radiation . Pt reports pain as 6-7/10. She denies breast or arm swelling on the affected R side. Pt has no new concerns.  (01/20/24 OT Initial Eval: Karina [name] is referred to  Occupational Therapy by Karina Due, PA-C for evaluation and treatment of R postmastectomy lymphedema. Pt reports swelling and discomfort in her breast and  axilla have not resolved since surgery. She complains of numbness and tingling in the R breast, axilla and proximal posterior arm.  Pt reports a sensation that there are knots under her skin along incision scars. She complains of deep axillary pain intermittently. Pt does not have a prophylactic compression arm sleeve , and she reports she was unable to tolerate a compression bra after surgery because it was too tight. Pt reports she has decided not to take radiation because she is afraid it might kill her because she has MS. I did a bunch of research on line and it is possible. OT encouraged Pt to speak with her oncology team ASAP to be certain she is making informed choices.)  PERTINENT HISTORY:  R BREAST LUMPECTOMY WITH RADIOACTIVE SEED AND SENTINEL LYMPH NODE BIOPSY. 1+/2 LN. 11/25/2023  Asthma,COPD, Depression, Diabetes, Lung nodule MS, Neuromuscular disorder, neuropathy, Plantar fasciitis, Seizures, Ulcerative colitis, Urinary incontinence, former smoker' anxiety disorder, arthralgia, R rotator cuff syndrome, thoracic back pain, RA,   PAIN:  Are you having pain? No NPRS scale: 0/10 Pain location: RUE/RUQ PAIN TYPE: numbness, stiffness Pain description: fullness, heaviness  Aggravating factors: housecleaning, washing clothes, cooking supper Relieving factors: rest breaks  PRECAUTIONS: Fall  WEIGHT BEARING RESTRICTIONS: No  FALLS:  Has patient fallen in last 6 months? Yes. Number of falls 1 after episode of vertigo fell onto bed My balance has been off more.  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  COGNITION:  Overall cognitive status: Within functional limits for tasks assessed   OBSERVATIONS / OTHER ASSESSMENTS: STAGE 0, Subclinical, RUE/RUQ , Post mastectomy lymphedema  SENSATION: Light touch: Deficits   Stereognosis: Appears intact Hot/Cold: Appears intact Proprioception: Appears intact  POSTURE: Guarding, shoulder protraction and elevation; head forward w cervical kyphosis-appears flexible  HAND DOMINANCE: Right  UPPER EXTREMITY AROM/PROM: WFL bilaterally, all planes  CERVICAL AROM:     Flexion WFL  Extension WFL  Right lateral flexion WFL  Left lateral flexion WFL  Right rotation WFL  Left rotation WFL    UPPER EXTREMITY STRENGTH: WFL  SURGERY TYPE/DATE: 11/25/23 R lumpectomy with SLNB and radioactive seed placement  NUMBER OF LYMPH NODES REMOVED: 1+/2  CHEMOTHERAPY: none  RADIATION:pending  HORMONE TREATMENT: pending  INFECTIONS: denies seroma; denies cellulitis  LYMPHEDEMA ASSESSMENTS:  BLE COMPARATIVE LIMB VOLUMETRICS INITIAL 01/29/24  LANDMARK RIGHT  RX, Dominant  R circ at MPs 19.7 cm  Wrist crease from base of small finger nail 12 cm  RUE C-G 1649.1 ml  Limb Volume differential (LVD)  1.87%, L>R%  Volume change since last measured %  Volume change since initial %  (Blank rows = not tested)  LANDMARK LEFT   L circ at MPs 18.8 cm  R Wrist crease from base of small finger nail 12 cm  LUE C-G 1680.0 ml  Limb Volume differential (LVD)  %  Volume change since last measured %  Volume change since  initial %  (Blank rows = not tested)    CHEST CIRCUMFERENCES: Nipple line: 114 cm Inframammry fold: 103.5 cm 2 inferior to inframammary fold: 109.5 cm GAIT: Distance walked: >500 ft Assistive device utilized: None Level of assistance: Modified independence and extra time- slow pace   PATIENT SURVEYS:  Lymphedema Life Impact Scale (LLIS):  TBA Rx   TREATMENT THIS DATE:  Anatomical measurements of nipple line, inferior mammary fold, and 2  inferior to fold for baseline chest measurements.MLD to RUQ/ RUE Pt and family edu  PATIENT EDUCATION:  Continued Pt/ CG edu for lymphedema self care home program throughout session. Topics include outcome of comparative limb volumetrics- starting limb volume differentials (LVDs), technology and gradient techniques used for short stretch, multilayer compression wrapping, simple self-MLD, therapeutic lymphatic pumping exercises, skin/nail care, LE precautions, compression garment recommendations and specifications, wear and care schedule and compression garment donning / doffing w assistive devices. Discussed progress towards all OT goals since commencing CDT. Discussed detrimental impact of obesity on lower and upper extremity lymphedema over time. Reviewed OT goals for lymphedema care with Pt and discussed progress to date.  All questions answered to the Pt's satisfaction. Good return. Person educated: Patient  Education method: Explanation, Demonstration, and Handouts Education comprehension: verbalized understanding, returned demonstration, verbal cues required, and needs further education   LYMPHEDEMA SELF-CARE HOME PROGRAM: lymphatic pumping there ex- 1 set of 10 each element, in order. Hold 5. 2 x daily  PROPHYLACTIC COMPRESSION ARM SLEEVE ( OFF THE SHELF, MEDICAL GRADE CCL 1 ( 20-30 MMhG)  3. Soft and comfortable compression bra, or camisole, that Pt can add a soft spot compression pad to PRN  By end of Intensive Phase CDT Pt should be fit with appropriate compression garments/ and/or devices to control swelling. In this case Off-the-shelf, medical grade, gradient compression arm sleeve and a compression bra are medically necessary to fit the dimensions of the affected extremity and breast, and to provide appropriate medical grade, graduated compression essential for optimally managing  chronic, progressive lymphedema. Multiple compression garments are needed to ensure proper hygiene to limit infection risk. Custom compression garments should be replaced q 3-6 months When worn consistently for optimal lymphedema self-management over time. HOS devices, medically necessary to limit fibrosis buildup in tissue, should be replaced q 2 years and PRN when worn out.    4. Daily simple self MLD used in conjunction with pneumatic device to engage thoracic duct return and to stimulate the terminus LNs in subclavian region.  5. Daily skin care with low ph lotion matching skin ph to reduce infection risk.  ASSESSMENT: CLINICAL IMPRESSION:   Provided Pr edu re function of MLD. Pt tolerated MLD to RUQ/ RUE in supine and side lying directing fluid towards adjacent AI anastomosis and thru anterior and posterior axillary anastomoses.  Completed baseline chest circumferential measurements. Cont as per POC.   Karina [name] is a 68 yo female presenting with subclinical, stage 0, RUE/RUQ , post mastectomy lymphedema s/p R lumpectomy and SLNB ( 1+/2). Radiation is pending. But Pt reports today that she is planning to decline it because she is afraid it may make her MS worse, or even cause her death. OT encouraged Pt to speak with her oncology team ASAP to ensure she is making informed decisions re her Rx.  The sentinel node technique is defined as the removal of no more than four lymph nodes, where the risk is about 6%.  If you have more than four lymph nodes removed,  the risk rises to 15% to 25%Patients who have  axillary lymph node dissection have a lifetime risk of 15-25% of developing lymphedema. For individuals whose surgery is limited to sentinel node  techniques, without adjuvant radiation, the risk is about 6%. radiation can traumatize the lymphatic system equivalently  to surgery, even if surgery is not done. However, just as all surgery is not alike, not all radiation therapy is the same. In  general, radiation therapy confers risk that is roughly equivalent  to axillary lymph node dissection and increases the risk associated with surgery if both treatments are performed.   [email or web address]  Ms Tupy presents with mild swelling of the R breast and R axilla. Palpable, nodular scar tissue is noted along well healed incision lines. Pt expresses concern about numbness of the R breast and posterior R arm and axilla.  Although we did not have time to complete comparative limb volumetrics today during the eval, we will on Rx visit 1. By visual assessment the upper extremities appear symmetrical and high protein, thickened lymphatic congestion is not palpable in the arm. Swelling is absent in the R trunk and scapula. Pt presents with full, BUE AROM without pain at end ranges. Pt does complain that she doesn't use the R arm much [name] to shoulder pain, which I suspect is stiffness 2/2 R rotator cuff syndrome. OT encouraged Pt to actively use the R arm in functional activities to avoid frozen shoulder .  Pt will benefit from skilled OT to reduce swelling in the R breast and axilla and to limit lymphedema progression. Reducing lymphatic load will decrease swelling and associated discomfort. Rather than Intensive Phase Complete Decongestive Therapy (CDT), the gold standard for lymphedema care,  which includes compression bandaging, manual lymphatic drainage, skin care and ther ex, I recommend Pt undergo modified CDT 1 x weekly for 8-12 weeks and PRN.  Rather than wrap the extremity we'll fit Pt with a comfortable, off the shelf, prophylactic compression bra, or camisole and a prophylactic, ccl 1 ( 20-30 mmHg) arm sleeve.  If Pt opts out of XRT, we will provide manual therapy and teach Pt how to perform simple self MLD and ther ex as part of lymphedema self care home program.   Radiation therapy is an integral tool for treating certain cancers, as it extends disease-free  survival, overall survival and reduces regional failure; however, it is known that Radiation Therapy increases the risk of lymphedema through multiple avenues.  It is therefore very beneficial to provide CDT throughout and after RT to limit lymphatic overload by utilizing alternative pathways, and to train patients and caregivers to perform daily lymphedema self-care. If Ms. Gonser participates in XRT we can provide MLD to watersheds adjacent to the radiation field to reduce the lymphatic overload to the affected quadrant during and after treatment to reduce risk of progression and provide relief from discomfort. At her first OT RX visit we'll  complete initial comparative limb volumetric measurements of bilateral upper extremities and teach lymphatic pumping ther ex. We'll monitor shoulder AROM and address PRN. Pt will learn lymphedema precautions and prevention strategies. Without skilled OT for lymphedema care, Pt's condition may progress and further functional decline is likely.    OBJECTIVE IMPAIRMENTS: decreased balance, decreased knowledge of condition, decreased knowledge of use of DME, increased edema, increased fascial restrictions, postural dysfunction, and pain.   ACTIVITY LIMITATIONS: carrying, lifting, pushing, pulling, urinary continence, caring for others  PARTICIPATION LIMITATIONS: cleaning  PERSONAL FACTORS: Behavior pattern, Education, Fitness, Past/current  experiences, and 3+ comorbidities: anxiety disorder, arthralgia, R rotator cuff syndrome, thoracic back pain, RA, MS are also affecting patient's functional outcome.   REHAB POTENTIAL: Good  EVALUATION COMPLEXITY: Moderate  GOALS: Goals reviewed with patient? Yes  SHORT TERM GOALS: Target date: 4th OT Rx visit    1. Pt will demonstrate understanding of lymphedema precautions and prevention strategies with moderate assistance using a printed reference to identify at least 5 precautions and discussing how s/he may implement  them into daily life to reduce risk of progression with extra time. Baseline: Dependent Goal status: INITIAL  2. Pt will obtain recommended compression garments, and be able to don and doff prophylactic compression arm sleeve and compression bra with modified independence using assistive devices PRN for highest level of independence with LE self care home program. Baseline: Dependent\ Goal status: INITIAL  LONG TERM GOALS: Target date: 04/17/24  1.Given this patient's Intake score of TBD % on the Lymphedema Life Impact Scale (LLIS), patient will experience a reduction of at least 5 points in her perceived level of functional impairment resulting from lymphedema to improve functional performance and quality of life (QOL). Baseline: TBD % Goal status: INITIAL  2. Pt will be able to perform all LYMPHEDEMA SELF CARE HOME PROGRAM components using printed references and extra time (modified independence) by DC to limit LE progression and to retain clinical gains.  .Baseline: Dependent Goal status: INITIAL  3. Pt will achieve at least 85% compliance with all adapted lymphedema self-care home program components, including daily skin care, prophylactic compression,  simple self MLD and lymphatic pumping therex to habituate LE self care protocol  into ADLs for optimal LE self-management over time. Baseline: Dependent Goal status: INITIAL  PLAN:  OT FREQUENCY: 1x/week  PT DURATION: 8- 12 weeks, and PRN  PLANNED INTERVENTIONS: 97110-Therapeutic exercises, 97530- Therapeutic activity, [id number]- Neuromuscular re-education, 97535- Self Care, [id number]- Manual therapy, Patient/Family education, Taping, Manual lymph drainage, Scar mobilization, and DME instructions; skin care,   PLAN FOR NEXT SESSION: Bilateral comparative limb volumetrics; Pt to complete LLIS, MLD  Zebedee Dec, MS, OTR/L, CLT-LANA 02/06/24 3:17 PM

## 2024-02-07 ENCOUNTER — Ambulatory Visit

## 2024-02-08 ENCOUNTER — Ambulatory Visit

## 2024-02-09 ENCOUNTER — Ambulatory Visit

## 2024-02-09 ENCOUNTER — Ambulatory Visit: Admitting: Occupational Therapy

## 2024-02-10 ENCOUNTER — Ambulatory Visit

## 2024-02-13 ENCOUNTER — Ambulatory Visit

## 2024-02-13 ENCOUNTER — Ambulatory Visit: Admitting: Occupational Therapy

## 2024-02-14 ENCOUNTER — Ambulatory Visit

## 2024-02-14 ENCOUNTER — Ambulatory Visit: Admitting: Radiation Oncology

## 2024-02-15 ENCOUNTER — Ambulatory Visit: Admitting: Occupational Therapy

## 2024-02-15 ENCOUNTER — Ambulatory Visit

## 2024-02-16 ENCOUNTER — Ambulatory Visit

## 2024-02-17 ENCOUNTER — Ambulatory Visit

## 2024-02-20 ENCOUNTER — Ambulatory Visit

## 2024-02-20 ENCOUNTER — Ambulatory Visit: Admitting: Occupational Therapy

## 2024-02-20 ENCOUNTER — Ambulatory Visit: Admitting: Neurology

## 2024-02-21 ENCOUNTER — Ambulatory Visit

## 2024-02-22 ENCOUNTER — Ambulatory Visit: Admitting: Radiation Oncology

## 2024-02-22 ENCOUNTER — Ambulatory Visit

## 2024-02-23 ENCOUNTER — Ambulatory Visit

## 2024-02-23 ENCOUNTER — Ambulatory Visit: Admitting: Occupational Therapy

## 2024-02-24 ENCOUNTER — Ambulatory Visit

## 2024-02-24 ENCOUNTER — Telehealth: Payer: Self-pay

## 2024-02-24 ENCOUNTER — Telehealth: Payer: Self-pay | Admitting: Hematology

## 2024-02-24 NOTE — Telephone Encounter (Signed)
 Attempted to contact the patient @T [phone] to reschedule her 02/27/24 office visit. Unable to reach the patient. LVM to contact facility.

## 2024-02-27 ENCOUNTER — Ambulatory Visit

## 2024-02-27 ENCOUNTER — Inpatient Hospital Stay: Attending: Hematology | Admitting: Hematology

## 2024-02-28 ENCOUNTER — Ambulatory Visit

## 2024-02-29 ENCOUNTER — Ambulatory Visit

## 2024-03-05 ENCOUNTER — Ambulatory Visit

## 2024-03-06 ENCOUNTER — Ambulatory Visit

## 2024-03-07 ENCOUNTER — Ambulatory Visit

## 2024-03-09 ENCOUNTER — Ambulatory Visit

## 2024-03-10 ENCOUNTER — Other Ambulatory Visit: Payer: Self-pay | Admitting: Adult Health

## 2024-03-12 ENCOUNTER — Ambulatory Visit: Attending: Radiology | Admitting: Occupational Therapy

## 2024-03-12 ENCOUNTER — Ambulatory Visit

## 2024-03-13 ENCOUNTER — Ambulatory Visit

## 2024-03-13 ENCOUNTER — Ambulatory Visit: Admitting: Radiation Oncology

## 2024-03-14 ENCOUNTER — Ambulatory Visit

## 2024-03-14 ENCOUNTER — Ambulatory Visit: Admitting: Neurology

## 2024-03-14 ENCOUNTER — Telehealth: Payer: Self-pay | Admitting: Adult Health

## 2024-03-14 NOTE — Telephone Encounter (Signed)
 Pt came into office states she is not and has not taken the lamoTRIgine  (LAMICTAL ) 25 MG tablet  prescribed by Dr. Vear. Pt states she will not take the medication bc it is for bi-polar disorder and she needs something to help the burning in her feet.  Pt would like a refill of  gabapentin  (NEURONTIN ) 300 MG capsule

## 2024-03-14 NOTE — Telephone Encounter (Signed)
 Spoke to patient states picked up gabapentin  today at pharmacy

## 2024-03-15 ENCOUNTER — Ambulatory Visit: Admitting: Occupational Therapy

## 2024-03-15 ENCOUNTER — Ambulatory Visit

## 2024-03-16 ENCOUNTER — Ambulatory Visit

## 2024-03-16 VITALS — Ht 62.0 in | Wt 175.0 lb

## 2024-03-16 DIAGNOSIS — Z0000 Encounter for general adult medical examination without abnormal findings: Secondary | ICD-10-CM

## 2024-03-16 DIAGNOSIS — Z87891 Personal history of nicotine dependence: Secondary | ICD-10-CM

## 2024-03-16 DIAGNOSIS — Z122 Encounter for screening for malignant neoplasm of respiratory organs: Secondary | ICD-10-CM

## 2024-03-16 NOTE — Progress Notes (Signed)
 "  Chief Complaint  Patient presents with   Medicare Wellness     Subjective:   Karina [name] is a 69 y.o. female who presents for a Medicare Annual Wellness Visit.  Visit info / Clinical Intake: Medicare Wellness Visit Type:: Subsequent Annual Wellness Visit Persons participating in visit and providing information:: patient Medicare Wellness Visit Mode:: Telephone If telephone:: video declined Since this visit was completed virtually, some vitals may be partially provided or unavailable. Missing vitals are due to the limitations of the virtual format.: Documented vitals are patient reported If Telephone or Video please confirm:: I connected with patient using audio/video enable telemedicine. I verified patient identity with two identifiers, discussed telehealth limitations, and patient agreed to proceed. Patient Location:: Home Provider Location:: Office Interpreter Needed?: No Pre-visit prep was completed: yes AWV questionnaire completed by patient prior to visit?: no Living arrangements:: lives with spouse/significant other Patient's Overall Health Status Rating: (!) fair Typical amount of pain: none Does pain affect daily life?: no Are you currently prescribed opioids?: no  Dietary Habits and Nutritional Risks How many meals a day?: 2 Eats fruit and vegetables daily?: yes Most meals are obtained by: preparing own meals; eating out Diabetic:: no  Functional Status Activities of Daily Living (to include ambulation/medication): Independent Ambulation: Independent with device- listed below Home Assistive Devices/Equipment: Eyeglasses; Nebulizers Medication Administration: Independent Home Management (perform basic housework or laundry): Independent Manage your own finances?: yes Primary transportation is: driving Concerns about vision?: no *vision screening is required for WTM* Concerns about hearing?: no  Fall Screening Falls in the past year?: 0 Number of falls in  past year: 0 Was there an injury with Fall?: 0 Fall Risk Category Calculator: 0 Patient Fall Risk Level: Low Fall Risk  Fall Risk Patient at Risk for Falls Due to: No Fall Risks Fall risk Follow up: Falls evaluation completed; Falls prevention discussed  Home and Transportation Safety: All rugs have non-skid backing?: N/A, no rugs All stairs or steps have railings?: N/A, no stairs Grab bars in the bathtub or shower?: yes Have non-skid surface in bathtub or shower?: yes Good home lighting?: yes Regular seat belt use?: yes Hospital stays in the last year:: no  Cognitive Assessment Difficulty concentrating, remembering, or making decisions? : no Will 6CIT or Mini Cog be Completed: yes What year is it?: 0 points What month is it?: 0 points Give patient an address phrase to remember (5 components): [address] [place], [place] About what time is it?: 0 points Count backwards from 20 to 1: 0 points Say the months of the year in reverse: 0 points Repeat the address phrase from earlier: 2 points (Drive) 6 CIT Score: 2 points  Advance Directives (For Healthcare) Does Patient Have a Medical Advance Directive?: No Would patient like information on creating a medical advance directive?: No - Patient declined  Reviewed/Updated  Reviewed/Updated: Reviewed All (Medical, Surgical, Family, Medications, Allergies, Care Teams, Patient Goals)    Allergies (verified) Atorvastatin, Percocet [oxycodone-acetaminophen ], Tecfidera [dimethyl fumarate], Amitriptyline hcl, Betaseron [interferon beta-1b], Duloxetine, Pregabalin, Zoloft  [sertraline  hcl], Crestor  [rosuvastatin ], Clarithromycin, Levofloxacin, and Pseudoephedrine   Current Medications (verified) Outpatient Encounter Medications as of 03/16/2024  Medication Sig   albuterol  (VENTOLIN  HFA) 108 (90 Base) MCG/ACT inhaler INHALE 2 PUFFS UP TO EVERY 4 HOURS AS NEEDED FOR WHEEZING   ALPRAZolam  (XANAX ) 0.5 MG tablet Take 1 tablet (0.5 mg total) by  mouth 2 (two) times daily as needed. For anxiety.   aspirin  EC 81 MG tablet Take 1 tablet (  81 mg total) by mouth daily. Swallow whole.   budesonide -formoterol  (SYMBICORT ) 160-4.5 MCG/ACT inhaler Inhale 2 puffs into the lungs 2 (two) times daily. (Patient taking differently: Inhale 2 puffs into the lungs 2 (two) times daily as needed.)   cholecalciferol (VITAMIN D3) 25 MCG (1000 UNIT) tablet Take 1,000 Units by mouth daily.   famotidine  (PEPCID ) 20 MG tablet TAKE 1 TABLET (20 MG TOTAL) BY MOUTH DAILY. TAKE BEFORE DINNER   fluticasone  (FLONASE ) 50 MCG/ACT nasal spray Place 2 sprays into both nostrils daily. (Patient taking differently: Place 2 sprays into both nostrils daily as needed.)   gabapentin  (NEURONTIN ) 300 MG capsule TAKE 2 CAPSULES BY MOUTH AT BEDTIME   ipratropium-albuterol  (DUONEB) 0.5-2.5 (3) MG/3ML SOLN TAKE 3 MLS BY NEBULIZATION EVERY 6 (SIX) HOURS AS NEEDED.   lamoTRIgine  (LAMICTAL ) 25 MG tablet For one week take one pill po every day, then for one week take one pill bid, then take one pill po qAM and 2 pills po qHS   methylcellulose (CITRUCEL) oral powder 1 tsp daily   Multiple Vitamin (MULTIVITAMIN) tablet Take 1 tablet by mouth daily.   pantoprazole  (PROTONIX ) 40 MG tablet Take 1 tablet (40 mg total) by mouth daily before breakfast.   rosuvastatin  (CRESTOR ) 10 MG tablet Take 1 tablet (10 mg total) by mouth daily.   Spacer/Aero-Holding Chambers (AEROCHAMBER MV) inhaler Use as instructed   nicotine (NICODERM CQ - DOSED IN MG/24 HOURS) 21 mg/24hr patch Place 21 mg onto the skin daily. (Patient not taking: Reported on 03/16/2024)   traMADol  (ULTRAM ) 50 MG tablet Take 1 tablet (50 mg total) by mouth every 6 (six) hours as needed. (Patient not taking: Reported on 03/16/2024)   No facility-administered encounter medications on file as of 03/16/2024.    History: Past Medical History:  Diagnosis Date   Allergic rhinitis    Allergy    Anxiety    [facility]- [facility], no per pt   Asthma     Breast cancer [facility])    Sept 2025   Cataract    Colon polyps 08/2008   Adenomatous   Complication of anesthesia    violent when waking up   COPD (chronic obstructive pulmonary disease) (HCC)    Depression    no per pt   Diabetes mellitus    Gastritis 08/2008   H. pylori   GERD (gastroesophageal reflux disease)    Hypercholesteremia    Hypercholesteremia    Lung nodule    Menopausal disorder    Multiple sclerosis    Neuromuscular disorder (HCC)    Neuropathy    Plantar fasciitis    Seizures (HCC)    1 seizure in 2001 due to blood sugar dropping to 38, none since   Shingles    Left V1   Ulcerative colitis (HCC)    Urinary incontinence    Vertigo    Past Surgical History:  Procedure Laterality Date   BREAST BIOPSY Right 10/07/2023   US  RT BREAST BX W LOC DEV 1ST LESION IMG BX SPEC US  GUIDE 10/07/2023 [facility]   BREAST BIOPSY  11/25/2023   US  RT RADIOACTIVE SEED LOC 11/25/2023 [facility]   BREAST LUMPECTOMY WITH RADIOACTIVE SEED AND SENTINEL LYMPH NODE BIOPSY Right 11/25/2023   Procedure: BREAST LUMPECTOMY WITH RADIOACTIVE SEED AND SENTINEL LYMPH NODE BIOPSY;  Surgeon: Curvin Deward MOULD, MD;  Location: [facility];  Service: General;  Laterality: Right;  GEN w/PEC BLOCK RIGHT BREAST RADIOACTIVE SEED LOCALIZED central LUMPECTOMY SENTINEL NODE BIOPSY   CATARACT  EXTRACTION Bilateral 2023   CERVICAL DISCECTOMY  03/08/2002   x 2, Anterior; Fusion C4-5, C5-6, C6-7, Dr. Arley Helling   CHEST CT  11/06/2008   With small 4 mm nodule L lung base (rec re check in 1 year)   CHEST CT  07/06/2009   Re check chest CT - lung nodule stable   CHOLECYSTECTOMY     COLONOSCOPY  08/06/2008   Polyps/ re check 5 yrs   CT SINUS LTD W/O CM  02/05/2009   negative   DILATION AND CURETTAGE OF UTERUS     ESOPHAGOGASTRODUODENOSCOPY  08/06/2008   Erosive gastritis, h pylori (treated)   FOOT SURGERY     left plantar fascial problem   ROTATOR CUFF REPAIR     right    TONSILLECTOMY     TUBAL LIGATION     UPPER GASTROINTESTINAL ENDOSCOPY     Family History  Problem Relation Age of Onset   Migraines Mother    Heart attack Mother    Coronary artery disease Mother    Coronary artery disease Father        6 bypasses    Stroke Father    Diabetes Father    Renal Disease Father    Colon cancer Maternal Grandfather 62   Coronary artery disease Paternal Grandmother    Lung cancer Paternal Aunt    Coronary artery disease Cousin    Multiple sclerosis Neg Hx    Esophageal cancer Neg Hx    Rectal cancer Neg Hx    Stomach cancer Neg Hx    Breast cancer Neg Hx    Social History   Occupational History   Occupation: Disabled     Employer: DISABLED  Tobacco Use   Smoking status: Former    Current packs/day: 0.00    Average packs/day: 2.0 packs/day for 30.0 years (60.0 ttl pk-yrs)    Types: Cigarettes    Start date: 05/07/1991    Quit date: 05/06/2021    Years since quitting: 2.8   Smokeless tobacco: Never  Vaping Use   Vaping status: Never Used  Substance and Sexual Activity   Alcohol use: No    Alcohol/week: 0.0 standard drinks of alcohol   Drug use: No    Comment: last smoked 11/16/23   Sexual activity: Yes   Tobacco Counseling Counseling given: Yes  SDOH Screenings   Food Insecurity: No Food Insecurity (03/16/2024)  Housing: Unknown (03/16/2024)  Transportation Needs: No Transportation Needs (03/16/2024)  Utilities: Not At Risk (03/16/2024)  Alcohol Screen: Low Risk (09/30/2022)  Depression (PHQ2-9): Low Risk (03/16/2024)  Financial Resource Strain: Low Risk (09/30/2022)  Physical Activity: Inactive (03/16/2024)  Social Connections: Moderately Integrated (03/16/2024)  Stress: No Stress Concern Present (03/16/2024)  Tobacco Use: Medium Risk (03/16/2024)  Health Literacy: Adequate Health Literacy (03/16/2024)   See flowsheets for full screening details  Depression Screen PHQ 2 & 9 Depression Scale- Over the past 2 weeks, how often have you been bothered by  any of the following problems? Little interest or pleasure in doing things: 0 Feeling down, depressed, or hopeless (PHQ Adolescent also includes...irritable): 0 PHQ-2 Total Score: 0 Trouble falling or staying asleep, or sleeping too much: 0 Feeling tired or having little energy: 0 Poor appetite or overeating (PHQ Adolescent also includes...weight loss): 0 Feeling bad about yourself - or that you are a failure or have let yourself or your family down: 0 Trouble concentrating on things, such as reading the newspaper or watching television (PHQ Adolescent also includes...like school work):  0 Moving or speaking so slowly that other people could have noticed. Or the opposite - being so fidgety or restless that you have been moving around a lot more than usual: 0 Thoughts that you would be better off dead, or of hurting yourself in some way: 0 PHQ-9 Total Score: 0 If you checked off any problems, how difficult have these problems made it for you to do your work, take care of things at home, or get along with other people?: Not difficult at all  Depression Treatment Depression Interventions/Treatment : Medication; Currently on Treatment; PHQ2-9 Score <4 Follow-up Not Indicated     Goals Addressed               This Visit's Progress     Patient Stated (pt-stated)        Patient stated she plans to continue to live              Objective:    Today's Vitals   03/16/24 1545  Weight: 175 lb (79.4 kg)  Height: 5' 2 (1.575 m)   Body mass index is 32.01 kg/m.  Hearing/Vision screen Hearing Screening - Comments:: Denies hearing difficulties   Vision Screening - Comments:: Wears rx glasses - up to date with routine eye exams with Dr Parke Immunizations and Health Maintenance Health Maintenance  Topic Date Due   Diabetic kidney evaluation - Urine ACR  Never done   Zoster Vaccines- Shingrix (1 of 2) Never done   Pneumococcal Vaccine: 50+ Years (2 of 2 - PCV) 07/28/2004    Lung Cancer Screening  02/26/2022   DTaP/Tdap/Td (3 - Td or Tdap) 02/01/2023   Influenza Vaccine  10/07/2023   COVID-19 Vaccine (1) 09/26/2024 (Originally 07/20/1956)   Diabetic kidney evaluation - eGFR measurement  10/18/2024   Mammogram  10/24/2024   Medicare Annual Wellness (AWV)  03/16/2025   Colonoscopy  07/30/2026   Bone Density Scan  Completed   Hepatitis C Screening  Completed   Meningococcal B Vaccine  Aged Out        Assessment/Plan:  This is a routine wellness examination for Karina [name].  I have recommended that this patient have a immunization for Influenza, Shingles, Pneumonia, and Tdap but she declines at this time. I have discussed the risks and benefits of this procedure with her. The patient verbalizes understanding.   Lung Cancer Screening: ordered today  Patient Care Team: [name], Laine LABOR, MD as PCP - General (Family Medicine) Jenel Carlin POUR, MD (Inactive) as Consulting Physician (Neurology) Perla Evalene PARAS, MD as Consulting Physician (Cardiology) Roz Anes, MD as Consulting Physician (Ophthalmology) Fate Morna SAILOR, [facility] (Inactive) as Pharmacist (Pharmacist) Tyree Nanetta SAILOR, RN as Oncology Nurse Navigator Gerome, Devere HERO, RN as Oncology Nurse Navigator Curvin Deward MOULD, MD as Consulting Physician (General Surgery) Lanny Callander, MD as Consulting Physician (Hematology) Shannon Agent, MD as Consulting Physician (Radiation Oncology) Dingeldein, Elspeth, MD (Ophthalmology)  I have personally reviewed and noted the following in the patients chart:   Medical and social history Use of alcohol, tobacco or illicit drugs  Current medications and supplements including opioid prescriptions. Functional ability and status Nutritional status Physical activity Advanced directives List of other physicians Hospitalizations, surgeries, and ER visits in previous 12 months Vitals Screenings to include cognitive, depression, and falls Referrals and  appointments  Orders Placed This Encounter  Procedures   Ambulatory Referral for Lung Cancer Scre    Referral Priority:   Routine    Referral Type:   Consultation  Referral Reason:   Specialty Services Required    Referred to Provider:   Ruthell Lauraine FALCON, NP    Number of Visits Requested:   1   In addition, I have reviewed and discussed with patient certain preventive protocols, quality metrics, and best practice recommendations. A written personalized care plan for preventive services as well as general preventive health recommendations were provided to patient.   Verdie CHRISTELLA Saba, CMA   03/16/2024   Return in 1 year (on 03/16/2025).  After Visit Summary: (MyChart) Due to this being a telephonic visit, the after visit summary with patients personalized plan was offered to patient via MyChart   Nurse Notes: scheduled 2027 AWV appt "

## 2024-03-16 NOTE — Patient Instructions (Addendum)
 Karina [name],  Thank you for taking the time for your Medicare Wellness Visit. I appreciate your continued commitment to your health goals. Please review the care plan we discussed, and feel free to reach out if I can assist you further.  Please note that Annual Wellness Visits do not include a physical exam. Some assessments may be limited, especially if the visit was conducted virtually. If needed, we may recommend an in-person follow-up with your provider.  Ongoing Care Seeing your primary care provider every 3 to 6 months helps us  monitor your health and provide consistent, personalized care.   Referrals If a referral was made during today's visit and you haven't received any updates within two weeks, please contact the referred provider directly to check on the status.  Recommended Screenings:  Health Maintenance  Topic Date Due   Yearly kidney health urinalysis for diabetes  Never done   Zoster (Shingles) Vaccine (1 of 2) Never done   Pneumococcal Vaccine for age over 19 (2 of 2 - PCV) 07/28/2004   Screening for Lung Cancer  02/26/2022   DTaP/Tdap/Td vaccine (3 - Td or Tdap) 02/01/2023   Flu Shot  10/07/2023   COVID-19 Vaccine (1) 09/26/2024*   Yearly kidney function blood test for diabetes  10/18/2024   Breast Cancer Screening  10/24/2024   Medicare Annual Wellness Visit  03/16/2025   Colon Cancer Screening  07/30/2026   Osteoporosis screening with Bone Density Scan  Completed   Hepatitis C Screening  Completed   Meningitis B Vaccine  Aged Out  *Topic was postponed. The date shown is not the original due date.       03/16/2024    3:47 PM  Advanced Directives  Does Patient Have a Medical Advance Directive? No  Would patient like information on creating a medical advance directive? No - Patient declined    Vision: Annual vision screenings are recommended for early detection of glaucoma, cataracts, and diabetic retinopathy. These exams can also reveal signs of chronic  conditions such as diabetes and high blood pressure.  Dental: Annual dental screenings help detect early signs of oral cancer, gum disease, and other conditions linked to overall health, including heart disease and diabetes.

## 2024-03-19 ENCOUNTER — Ambulatory Visit: Payer: Self-pay | Admitting: Family Medicine

## 2024-03-19 ENCOUNTER — Ambulatory Visit

## 2024-03-19 ENCOUNTER — Ambulatory Visit: Admitting: Occupational Therapy

## 2024-03-19 ENCOUNTER — Ambulatory Visit (INDEPENDENT_AMBULATORY_CARE_PROVIDER_SITE_OTHER): Admitting: Family Medicine

## 2024-03-19 ENCOUNTER — Encounter: Payer: Self-pay | Admitting: Family Medicine

## 2024-03-19 VITALS — BP 124/78 | HR 68 | Temp 97.7°F | Ht 62.0 in | Wt 176.2 lb

## 2024-03-19 DIAGNOSIS — Z79899 Other long term (current) drug therapy: Secondary | ICD-10-CM | POA: Diagnosis not present

## 2024-03-19 DIAGNOSIS — E559 Vitamin D deficiency, unspecified: Secondary | ICD-10-CM | POA: Diagnosis not present

## 2024-03-19 DIAGNOSIS — R5382 Chronic fatigue, unspecified: Secondary | ICD-10-CM

## 2024-03-19 DIAGNOSIS — R1084 Generalized abdominal pain: Secondary | ICD-10-CM | POA: Diagnosis not present

## 2024-03-19 DIAGNOSIS — J0100 Acute maxillary sinusitis, unspecified: Secondary | ICD-10-CM | POA: Diagnosis not present

## 2024-03-19 DIAGNOSIS — E538 Deficiency of other specified B group vitamins: Secondary | ICD-10-CM

## 2024-03-19 DIAGNOSIS — R300 Dysuria: Secondary | ICD-10-CM | POA: Diagnosis not present

## 2024-03-19 LAB — POC URINALSYSI DIPSTICK (AUTOMATED)
Bilirubin, UA: NEGATIVE
Blood, UA: NEGATIVE
Glucose, UA: NEGATIVE
Ketones, UA: NEGATIVE
Leukocytes, UA: NEGATIVE
Nitrite, UA: NEGATIVE
Protein, UA: NEGATIVE
Spec Grav, UA: 1.015
Urobilinogen, UA: 0.2 U/dL
pH, UA: 6

## 2024-03-19 LAB — TSH: TSH: 1.75 u[IU]/mL (ref 0.35–5.50)

## 2024-03-19 LAB — HEPATIC FUNCTION PANEL
ALT: 11 U/L (ref 3–35)
AST: 16 U/L (ref 5–37)
Albumin: 4.3 g/dL (ref 3.5–5.2)
Alkaline Phosphatase: 68 U/L (ref 39–117)
Bilirubin, Direct: 0.1 mg/dL (ref 0.1–0.3)
Total Bilirubin: 0.5 mg/dL (ref 0.2–1.2)
Total Protein: 7 g/dL (ref 6.0–8.3)

## 2024-03-19 LAB — CBC WITH DIFFERENTIAL/PLATELET
Basophils Absolute: 0.1 K/uL (ref 0.0–0.1)
Basophils Relative: 1.3 % (ref 0.0–3.0)
Eosinophils Absolute: 0.2 K/uL (ref 0.0–0.7)
Eosinophils Relative: 2.7 % (ref 0.0–5.0)
HCT: 41.5 % (ref 36.0–46.0)
Hemoglobin: 14 g/dL (ref 12.0–15.0)
Lymphocytes Relative: 28.7 % (ref 12.0–46.0)
Lymphs Abs: 2 K/uL (ref 0.7–4.0)
MCHC: 33.7 g/dL (ref 30.0–36.0)
MCV: 92.4 fl (ref 78.0–100.0)
Monocytes Absolute: 0.5 K/uL (ref 0.1–1.0)
Monocytes Relative: 6.9 % (ref 3.0–12.0)
Neutro Abs: 4.2 K/uL (ref 1.4–7.7)
Neutrophils Relative %: 60.4 % (ref 43.0–77.0)
Platelets: 222 K/uL (ref 150.0–400.0)
RBC: 4.49 Mil/uL (ref 3.87–5.11)
RDW: 13.7 % (ref 11.5–15.5)
WBC: 6.9 K/uL (ref 4.0–10.5)

## 2024-03-19 LAB — BASIC METABOLIC PANEL WITH GFR
BUN: 14 mg/dL (ref 6–23)
CO2: 31 meq/L (ref 19–32)
Calcium: 9.6 mg/dL (ref 8.4–10.5)
Chloride: 101 meq/L (ref 96–112)
Creatinine, Ser: 1.08 mg/dL (ref 0.40–1.20)
GFR: 53.66 mL/min — ABNORMAL LOW
Glucose, Bld: 94 mg/dL (ref 70–99)
Potassium: 4.6 meq/L (ref 3.5–5.1)
Sodium: 139 meq/L (ref 135–145)

## 2024-03-19 LAB — VITAMIN B12: Vitamin B-12: 186 pg/mL — ABNORMAL LOW (ref 211–911)

## 2024-03-19 LAB — VITAMIN D 25 HYDROXY (VIT D DEFICIENCY, FRACTURES): VITD: 44.34 ng/mL (ref 30.00–100.00)

## 2024-03-19 MED ORDER — AMOXICILLIN-POT CLAVULANATE 875-125 MG PO TABS: 1.0000 | ORAL_TABLET | Freq: Two times a day (BID) | ORAL | 0 refills | Status: AC

## 2024-03-19 MED ORDER — PREDNISONE 20 MG PO TABS: 20.0000 mg | ORAL_TABLET | Freq: Every day | ORAL | 0 refills | Status: AC

## 2024-03-19 NOTE — Patient Instructions (Addendum)
 Take the augmentin  and the prednisone  for sinus infection   Take care of yourself   Lab today for fatigue and abdominal pain  When that returns I will work on a referral for CT scan (abdomen and pelvis) with contrast   in [place] area   Urinalysis is clear   Update if not starting to improve in a week or if worsening

## 2024-03-19 NOTE — Assessment & Plan Note (Signed)
 Lab today  May be multi factorial Did improve briefly after breast cancer surgery then returned  Did not have the recommended radiation

## 2024-03-19 NOTE — Assessment & Plan Note (Signed)
 D level with lab today  Noted importance to bone and overall health

## 2024-03-19 NOTE — Assessment & Plan Note (Signed)
 B12 level added to lab

## 2024-03-19 NOTE — Assessment & Plan Note (Signed)
 1 month of increasing low abd pain  Not improved with laxative/ BM on Friday  No triggers  Neg urinalysis today  Tenderness is diffuse -worse on right   Lab today  Likely order Ct abd/pelv

## 2024-03-19 NOTE — Progress Notes (Signed)
 "  Subjective:    Patient ID: Karina [name], female    DOB: February 16, 1956, 69 y.o.   MRN: [id number]  HPI  Wt Readings from Last 3 Encounters:  03/19/24 176 lb 4 oz (79.9 kg)  03/16/24 175 lb (79.4 kg)  01/23/24 175 lb 8 oz (79.6 kg)   32.24 kg/m  Vitals:   03/19/24 1046  BP: 124/78  Pulse: 68  Temp: 97.7 F (36.5 C)  SpO2: 96%     Pt presents for  Sinus symptoms  Abd/urinary c/o  Sores in mouth  Fatigue (since breast cancer surgery in sept) - got better and then worse again   Duana- a month ago-worst was 2 wk ago Sinus pain - above and below eyes/worse on right  Mucous is green  Mouth is sore-some sores on lips/inside of mouth  No fever but very tired  Pnd all day  Coughing -prod Some wheezing    Over the counter  Flonase   Nasal spray  Mucinex  DM    Some pain in low abdomen (almost feels like menstrual cramps)  No bleeding  Constipated  Citrucel did not help like usual  Dulcolax-had one bm - Friday     GERD Protonix  40 mg daily  Pepcid  in past   History of IBS Colonoscopy 2023   Dr Legrand  Was planning EGD   Last CT abd/pelv was 2018 IMPRESSION: 1. Colonic diverticulosis without acute diverticulitis. No bowel obstruction. 2. Posterior calcified uterine fibroid measuring 8 x 5 mm. No adnexal mass or ascites. 3. Status post cholecystectomy. 4. Mild aortoiliac atherosclerosis. 5. Stable subpleural left lower lobe pulmonary nodule dating back to 2010.   Lab Results  Component Value Date   NA 138 10/19/2023   K 4.7 10/19/2023   CO2 33 (H) 10/19/2023   GLUCOSE 89 10/19/2023   BUN 10 10/19/2023   CREATININE 0.98 10/19/2023   CALCIUM  9.7 10/19/2023   GFR 52.89 (L) 02/22/2023   EGFR 53 (L) 07/07/2023   GFRNONAA >60 10/19/2023   Lab Results  Component Value Date   WBC 7.3 10/19/2023   HGB 14.3 10/19/2023   HCT 43.7 10/19/2023   MCV 93.6 10/19/2023   PLT 242 10/19/2023   Lab Results  Component Value Date   ALT 12 10/19/2023   AST 16  10/19/2023   ALKPHOS 77 10/19/2023   BILITOT 0.4 10/19/2023    Had surgery for breast cancer Choose not to do radiation   Results for orders placed or performed in visit on 03/19/24  POCT Urinalysis Dipstick (Automated)   Collection Time: 03/19/24 11:04 AM  Result Value Ref Range   Color, UA Yellow    Clarity, UA Clear    Glucose, UA Negative Negative   Bilirubin, UA Negative    Ketones, UA Negative    Spec Grav, UA 1.015 1.010 - 1.025   Blood, UA Negative    pH, UA 6.0 5.0 - 8.0   Protein, UA Negative Negative   Urobilinogen, UA 0.2 0.2 or 1.0 E.U./dL   Nitrite, UA Negative    Leukocytes, UA Negative Negative     Patient Active Problem List   Diagnosis Date Noted   Current use of proton pump inhibitor 03/19/2024   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   Malignant neoplasm of lower-outer quadrant of right breast of female, estrogen receptor positive (HCC) 10/17/2023   Lump of breast, right 09/30/2023   Bilateral hip pain 09/30/2023   Abdominal pain 02/22/2023   Depressed mood 10/04/2022   Left  flank pain 07/31/2022   Petechiae 07/26/2022   Globus sensation 07/26/2022   Occult blood positive stool 05/25/2021   Colon cancer screening 05/07/2021   Estrogen deficiency 05/07/2021   Rheumatoid arthritis (HCC) 03/31/2021   Coronary artery calcification 03/31/2021   Hypokalemia 08/27/2020   Joint pain in both hands 10/12/2019   Head pain 08/22/2019   Elevated serum creatinine 04/24/2019   Frequent urination 04/20/2019   Decreased appetite 04/20/2019   Encounter for screening mammogram for breast cancer 04/20/2019   Seborrheic keratosis, inflamed 12/15/2017   B12 deficiency 08/24/2017   Left low back pain 08/24/2017   Erosive osteoarthritis of both hands 09/09/2016   Fatigue 09/07/2016   Bilateral hand pain 09/07/2016   Routine general medical examination at a health care facility 05/06/2015   Large breasts 05/06/2015   Lumbar disc disease 11/01/2014    Knee pain, bilateral 03/11/2014   Thoracic back pain 11/30/2013   Encounter for routine gynecological examination 01/31/2013   Acute sinusitis 01/31/2013   Encounter for Medicare annual wellness exam 01/23/2013   Chest pain 09/20/2012   Dyspnea 09/13/2012   Asthmatic bronchitis , chronic (HCC) 12/06/2011   Chronic cough 11/22/2011   Anxiety disorder 07/17/2009   HEMORRHOIDS-EXTERNAL 11/07/2008   IRRITABLE BOWEL SYNDROME 11/07/2008   Lung nodule 10/14/2008   Prediabetes 10/14/2008   History of colonic polyps 10/08/2008   Vitamin D  deficiency 07/23/2008   SYNCOPE, HX OF 06/01/2008   FOOT SURGERY, HX OF 06/01/2008   DILATION AND CURETTAGE, HX OF 06/01/2008   OSTEOARTHRITIS, SHOULDER, RIGHT 03/22/2008   ROTATOR CUFF SYNDROME, RIGHT 03/22/2008   ARTHRALGIA 12/12/2007   Allergic rhinitis 08/01/2007   Former smoker 10/17/2006   Peripheral neuropathy 10/17/2006   FOOT PAIN, BILATERAL 10/17/2006   HYPOGLYCEMIA 09/30/2006   HYPERCHOLESTEROLEMIA 09/30/2006   MULTIPLE SCLEROSIS 09/30/2006   GERD 09/30/2006   FIBROCYSTIC BREAST DISEASE 09/30/2006   Past Medical History:  Diagnosis Date   Allergic rhinitis    Allergy    Anxiety    [facility], no per pt   Asthma    Breast cancer (HCC)    Sept 2025   Cataract    Colon polyps 08/2008   Adenomatous   Complication of anesthesia    violent when waking up   COPD (chronic obstructive pulmonary disease) (HCC)    Depression    no per pt   Diabetes mellitus    Gastritis 08/2008   H. pylori   GERD (gastroesophageal reflux disease)    Hypercholesteremia    Hypercholesteremia    Lung nodule    Menopausal disorder    Multiple sclerosis    Neuromuscular disorder (HCC)    Neuropathy    Plantar fasciitis    Seizures (HCC)    1 seizure in 2001 due to blood sugar dropping to 38, none since   Shingles    Left V1   Ulcerative colitis (HCC)    Urinary incontinence    Vertigo    Past Surgical History:  Procedure Laterality  Date   BREAST BIOPSY Right 10/07/2023   US  RT BREAST BX W LOC DEV 1ST LESION IMG BX SPEC US  GUIDE 10/07/2023 [facility]   BREAST BIOPSY  11/25/2023   US  RT RADIOACTIVE SEED LOC 11/25/2023 [facility]   BREAST LUMPECTOMY WITH RADIOACTIVE SEED AND SENTINEL LYMPH NODE BIOPSY Right 11/25/2023   Procedure: BREAST LUMPECTOMY WITH RADIOACTIVE SEED AND SENTINEL LYMPH NODE BIOPSY;  Surgeon: Curvin Deward MOULD, MD;  Location: [facility];  Service: General;  Laterality: Right;  GEN w/PEC BLOCK RIGHT BREAST RADIOACTIVE SEED LOCALIZED central LUMPECTOMY SENTINEL NODE BIOPSY   CATARACT EXTRACTION Bilateral 2023   CERVICAL DISCECTOMY  03/08/2002   x 2, Anterior; Fusion C4-5, C5-6, C6-7, Dr. Arley Helling   CHEST CT  11/06/2008   With small 4 mm nodule L lung base (rec re check in 1 year)   CHEST CT  07/06/2009   Re check chest CT - lung nodule stable   CHOLECYSTECTOMY     COLONOSCOPY  08/06/2008   Polyps/ re check 5 yrs   CT SINUS LTD W/O CM  02/05/2009   negative   DILATION AND CURETTAGE OF UTERUS     ESOPHAGOGASTRODUODENOSCOPY  08/06/2008   Erosive gastritis, h pylori (treated)   FOOT SURGERY     left plantar fascial problem   ROTATOR CUFF REPAIR     right   TONSILLECTOMY     TUBAL LIGATION     UPPER GASTROINTESTINAL ENDOSCOPY     Social History[1] Family History  Problem Relation Age of Onset   Migraines Mother    Heart attack Mother    Coronary artery disease Mother    Coronary artery disease Father        6 bypasses    Stroke Father    Diabetes Father    Renal Disease Father    Colon cancer Maternal Grandfather 104   Coronary artery disease Paternal Grandmother    Lung cancer Paternal Aunt    Coronary artery disease Cousin    Multiple sclerosis Neg Hx    Esophageal cancer Neg Hx    Rectal cancer Neg Hx    Stomach cancer Neg Hx    Breast cancer Neg Hx    Allergies[2] Medications Ordered Prior to Encounter[3]  Review of Systems  Constitutional:  Positive for  appetite change and fatigue. Negative for fever.  HENT:  Positive for congestion, ear pain, postnasal drip, sinus pressure, sinus pain and sore throat. Negative for nosebleeds and rhinorrhea.   Eyes:  Negative for pain, redness and itching.  Respiratory:  Positive for cough. Negative for shortness of breath and wheezing.   Cardiovascular:  Negative for chest pain.  Gastrointestinal:  Positive for abdominal pain. Negative for diarrhea, nausea and vomiting.  Endocrine: Negative for polyuria.  Genitourinary:  Negative for dysuria, frequency and urgency.  Musculoskeletal:  Negative for arthralgias and myalgias.  Allergic/Immunologic: Negative for immunocompromised state.  Neurological:  Positive for headaches. Negative for dizziness, tremors, syncope, weakness and numbness.       MS is stable  Hematological:  Negative for adenopathy. Does not bruise/bleed easily.  Psychiatric/Behavioral:  Negative for dysphoric mood. The patient is not nervous/anxious.        Objective:   Physical Exam Constitutional:      General: She is not in acute distress.    Appearance: Normal appearance. She is well-developed. She is obese. She is not ill-appearing.  HENT:     Head: Normocephalic and atraumatic.     Comments: Bilateral maxillary and frontal sinus tenderness    Right Ear: Tympanic membrane, ear canal and external ear normal.     Left Ear: Tympanic membrane, ear canal and external ear normal.     Ears:     Comments: Mild cerumen    Nose: Congestion and rhinorrhea present.     Mouth/Throat:     Pharynx: Oropharynx is clear. No oropharyngeal exudate or posterior oropharyngeal erythema.     Comments: Clear pnd Eyes:     General:  Right eye: No discharge.        Left eye: No discharge.     Conjunctiva/sclera: Conjunctivae normal.     Pupils: Pupils are equal, round, and reactive to light.  Cardiovascular:     Rate and Rhythm: Normal rate and regular rhythm.  Pulmonary:     Effort:  Pulmonary effort is normal. No respiratory distress.     Breath sounds: Normal breath sounds. No wheezing or rales.     Comments: Good air exch No rales or rhonchi Abdominal:     General: Abdomen is protuberant. Bowel sounds are normal.     Palpations: There is no fluid wave, hepatomegaly, splenomegaly, mass or pulsatile mass.     Tenderness: There is generalized abdominal tenderness. There is no right CVA tenderness, left CVA tenderness, guarding or rebound.  Musculoskeletal:     Cervical back: Normal range of motion and neck supple.  Lymphadenopathy:     Cervical: No cervical adenopathy.  Skin:    General: Skin is warm and dry.     Findings: No rash.  Neurological:     Mental Status: She is alert.     Cranial Nerves: No cranial nerve deficit.     Coordination: Coordination normal.  Psychiatric:        Mood and Affect: Mood normal.           Assessment & Plan:   Problem List Items Addressed This Visit       Respiratory   Acute sinusitis   S/p uri  Congestion/facial pain Reassuring exam  Prescription Augmentin   Prednisone  taper   Update if not starting to improve in a week or if worsening  Call back and Er precautions noted in detail today        Relevant Medications   amoxicillin -clavulanate (AUGMENTIN ) 875-125 MG tablet   predniSONE  (DELTASONE ) 20 MG tablet     Other   Vitamin D  deficiency   D level with lab today  Noted importance to bone and overall health      Relevant Orders   VITAMIN D  25 Hydroxy (Vit-D Deficiency, Fractures)   Fatigue   Lab today  May be multi factorial Did improve briefly after breast cancer surgery then returned  Did not have the recommended radiation         Relevant Orders   CBC with Differential/Platelet   Basic metabolic panel with GFR   Hepatic function panel   TSH   Current use of proton pump inhibitor   B12 level added to lab      Relevant Orders   Vitamin B12   Abdominal pain - Primary   1 month of  increasing low abd pain  Not improved with laxative/ BM on Friday  No triggers  Neg urinalysis today  Tenderness is diffuse -worse on right   Lab today  Likely order Ct abd/pelv      Relevant Orders   CBC with Differential/Platelet   Basic metabolic panel with GFR   Hepatic function panel   Other Visit Diagnoses       Dysuria       Relevant Orders   POCT Urinalysis Dipstick (Automated) (Completed)         [1]  Social History Tobacco Use   Smoking status: Former    Current packs/day: 0.00    Average packs/day: 2.0 packs/day for 30.0 years (60.0 ttl pk-yrs)    Types: Cigarettes    Start date: 05/07/1991    Quit date: 05/06/2021  Years since quitting: 2.8   Smokeless tobacco: Never  Vaping Use   Vaping status: Never Used  Substance Use Topics   Alcohol use: No    Alcohol/week: 0.0 standard drinks of alcohol   Drug use: No    Comment: last smoked 11/16/23  [2]  Allergies Allergen Reactions   Atorvastatin Other (See Comments)    elevated LFT's   Percocet [Oxycodone-Acetaminophen ] Shortness Of Breath    Felt like going to pass out and sweating   Tecfidera [Dimethyl Fumarate] Shortness Of Breath, Palpitations, Rash and Cough   Amitriptyline Hcl Itching and Swelling   Betaseron [Interferon Beta-1b] Other (See Comments)    Headache   Duloxetine Other (See Comments)    pain   Pregabalin Other (See Comments)    LE swelling   Zoloft  [Sertraline  Hcl] Other (See Comments)    Headaches    Crestor  [Rosuvastatin ] Other (See Comments)    Muscle aches and cramps to generic crestor  and other statins   Clarithromycin Other (See Comments)    reaction not known   Levofloxacin Rash   Pseudoephedrine Other (See Comments)    legs hurt  [3]  Current Outpatient Medications on File Prior to Visit  Medication Sig Dispense Refill   albuterol  (VENTOLIN  HFA) 108 (90 Base) MCG/ACT inhaler INHALE 2 PUFFS UP TO EVERY 4 HOURS AS NEEDED FOR WHEEZING 3 each 3   aspirin  EC 81 MG tablet  Take 1 tablet (81 mg total) by mouth daily. Swallow whole. 90 tablet 3   budesonide -formoterol  (SYMBICORT ) 160-4.5 MCG/ACT inhaler Inhale 2 puffs into the lungs 2 (two) times daily. (Patient taking differently: Inhale 2 puffs into the lungs 2 (two) times daily as needed.) 3 each 1   cholecalciferol (VITAMIN D3) 25 MCG (1000 UNIT) tablet Take 1,000 Units by mouth daily.     fluticasone  (FLONASE ) 50 MCG/ACT nasal spray Place 2 sprays into both nostrils daily. (Patient taking differently: Place 2 sprays into both nostrils daily as needed.) 16 g 11   gabapentin  (NEURONTIN ) 300 MG capsule TAKE 2 CAPSULES BY MOUTH AT BEDTIME 180 capsule 1   ipratropium-albuterol  (DUONEB) 0.5-2.5 (3) MG/3ML SOLN TAKE 3 MLS BY NEBULIZATION EVERY 6 (SIX) HOURS AS NEEDED. 360 mL 0   methylcellulose (CITRUCEL) oral powder 1 tsp daily     Multiple Vitamin (MULTIVITAMIN) tablet Take 1 tablet by mouth daily.     nicotine (NICODERM CQ - DOSED IN MG/24 HOURS) 21 mg/24hr patch Place 21 mg onto the skin daily.     pantoprazole  (PROTONIX ) 40 MG tablet Take 1 tablet (40 mg total) by mouth daily before breakfast.     rosuvastatin  (CRESTOR ) 10 MG tablet Take 1 tablet (10 mg total) by mouth daily. 90 tablet 3   Spacer/Aero-Holding Chambers (AEROCHAMBER MV) inhaler Use as instructed 1 each 0   No current facility-administered medications on file prior to visit.   "

## 2024-03-19 NOTE — Assessment & Plan Note (Addendum)
 S/p uri  Congestion/facial pain Reassuring exam  Prescription Augmentin   Prednisone  taper   Update if not starting to improve in a week or if worsening  Call back and Er precautions noted in detail today

## 2024-03-20 ENCOUNTER — Telehealth: Payer: Self-pay | Admitting: *Deleted

## 2024-03-20 ENCOUNTER — Ambulatory Visit

## 2024-03-20 NOTE — Telephone Encounter (Signed)
 Pt viewed labs via mychart. Per Dr. Randeen pt needs one B12 inj., please schedule a one time nurse visit to get B12 shot when able

## 2024-03-21 ENCOUNTER — Ambulatory Visit

## 2024-03-21 ENCOUNTER — Ambulatory Visit: Admitting: Occupational Therapy

## 2024-03-21 ENCOUNTER — Ambulatory Visit: Payer: Self-pay | Admitting: Family Medicine

## 2024-03-21 ENCOUNTER — Inpatient Hospital Stay: Admission: RE | Admit: 2024-03-21 | Discharge: 2024-03-21 | Attending: Family Medicine | Admitting: Family Medicine

## 2024-03-21 DIAGNOSIS — R1084 Generalized abdominal pain: Secondary | ICD-10-CM

## 2024-03-21 DIAGNOSIS — E538 Deficiency of other specified B group vitamins: Secondary | ICD-10-CM

## 2024-03-21 MED ORDER — CYANOCOBALAMIN 1000 MCG/ML IJ SOLN
1000.0000 ug | Freq: Once | INTRAMUSCULAR | Status: AC
  Administered 2024-03-21: 1000 ug via INTRAMUSCULAR

## 2024-03-21 MED ORDER — IOPAMIDOL (ISOVUE-300) INJECTION 61%
100.0000 mL | Freq: Once | INTRAVENOUS | Status: AC | PRN
  Administered 2024-03-21: 100 mL via INTRAVENOUS

## 2024-03-21 NOTE — Progress Notes (Signed)
 Per orders of Dr. Laine Balls, injection of vitamin b 12 given by Laray Arenas in left deltoid. Patient tolerated injection well. Since pt first vitamin b 12 injection  pt waited after receiving the injection without problem or concern. This was a one time vit b 12 injection. Pt is taking OTC Vit b 12 po 1000mcg daily as instructed. Pt will reck vit b12 lab in 2 months.

## 2024-03-22 ENCOUNTER — Ambulatory Visit

## 2024-03-22 NOTE — Progress Notes (Signed)
 Karina [name]                                          MRN: [id number]   03/22/2024   The [facility] Quality Team Specialist reviewed this patient medical record for the purposes of chart review for care gap closure. The following were reviewed: abstraction for care gap closure-controlling blood pressure.    [facility] Quality Team

## 2024-03-23 ENCOUNTER — Ambulatory Visit

## 2024-03-23 NOTE — Progress Notes (Signed)
 Karina [name]                                          MRN: [id number]   03/23/2024   The [facility] Quality Team Specialist reviewed this patient medical record for the purposes of chart review for care gap closure. The following were reviewed: chart review for care gap closure-glycemic status assessment.    [facility] Quality Team

## 2024-03-26 ENCOUNTER — Ambulatory Visit

## 2024-03-26 ENCOUNTER — Ambulatory Visit: Admitting: Occupational Therapy

## 2024-03-27 ENCOUNTER — Ambulatory Visit (HOSPITAL_BASED_OUTPATIENT_CLINIC_OR_DEPARTMENT_OTHER)
Admission: RE | Admit: 2024-03-27 | Discharge: 2024-03-27 | Disposition: A | Discharge status: Home / Self Care | Source: Ambulatory Visit | Attending: Hematology | Admitting: Hematology

## 2024-03-27 ENCOUNTER — Inpatient Hospital Stay: Attending: Hematology | Admitting: Hematology

## 2024-03-27 ENCOUNTER — Ambulatory Visit

## 2024-03-27 VITALS — BP 130/80 | HR 79 | Temp 97.5°F | Resp 17 | Ht 62.0 in | Wt 176.4 lb

## 2024-03-27 DIAGNOSIS — M810 Age-related osteoporosis without current pathological fracture: Secondary | ICD-10-CM | POA: Insufficient documentation

## 2024-03-27 DIAGNOSIS — C50511 Malignant neoplasm of lower-outer quadrant of right female breast: Secondary | ICD-10-CM | POA: Diagnosis present

## 2024-03-27 DIAGNOSIS — Z170 Estrogen receptor positive status [ER+]: Secondary | ICD-10-CM | POA: Insufficient documentation

## 2024-03-27 DIAGNOSIS — I82492 Acute embolism and thrombosis of other specified deep vein of left lower extremity: Secondary | ICD-10-CM | POA: Insufficient documentation

## 2024-03-27 DIAGNOSIS — Z79811 Long term (current) use of aromatase inhibitors: Secondary | ICD-10-CM | POA: Diagnosis not present

## 2024-03-27 DIAGNOSIS — M816 Localized osteoporosis [Lequesne]: Secondary | ICD-10-CM

## 2024-03-27 MED ORDER — EXEMESTANE 25 MG PO TABS: 25.0000 mg | ORAL_TABLET | Freq: Every day | ORAL | 3 refills | Status: AC

## 2024-03-27 NOTE — Assessment & Plan Note (Addendum)
 invasive lobular carcinoma of right breast, ER/PR positive, HER2 negative, cT1cN0M0, stage IA -Invasive lobular carcinoma of the right breast, measuring 1.3 cm, ER/PR positive, HER2 negative, stage 1, located behind the nipple. the mass measured 1.8cm on MRI -breast MRI showed indeterminate 1.6 cm non-mass enhancement in the upper RIGHT breast at middle depth at the 12 o'clock location, biopsy was benign.  - She underwent a lumpectomy and sentinel lymph node biopsy on November 25, 2023.  Surgical path showed 2.2 cm invasive lobular carcinoma, and a 1 positive sentinel lymph node.  Margins were negative. -She declined adjuvant radiation -I strongly recommend adjuvant antiestrogen therapy.

## 2024-03-27 NOTE — Progress Notes (Signed)
 " Karina [name]   [phone] [phone]   Clinic Follow up Note   Patient Care Team: [name], Laine LABOR, MD as PCP - General (Family Medicine) Jenel Carlin POUR, MD (Inactive) as Consulting Physician (Neurology) Perla Evalene PARAS, MD as Consulting Physician (Cardiology) Roz Anes, MD as Consulting Physician (Ophthalmology) Fate Morna SAILOR, [facility] (Inactive) as Pharmacist (Pharmacist) Tyree Nanetta SAILOR, RN as Oncology Nurse Navigator Gerome, Devere HERO, RN as Oncology Nurse Navigator Curvin Deward MOULD, MD as Consulting Physician (General Surgery) Lanny Callander, MD as Consulting Physician (Hematology) Shannon Agent, MD as Consulting Physician (Radiation Oncology) Lenn Standing, MD (Ophthalmology)  Date of Service:  03/27/2024  CHIEF COMPLAINT: f/u of right breast cancer  CURRENT THERAPY:  Pending exemestane   Oncology History   Malignant neoplasm of lower-outer quadrant of right breast of female, estrogen receptor positive (HCC) invasive lobular carcinoma of right breast, ER/PR positive, HER2 negative, cT1cN0M0, stage IA -Invasive lobular carcinoma of the right breast, measuring 1.3 cm, ER/PR positive, HER2 negative, stage 1, located behind the nipple. the mass measured 1.8cm on MRI -breast MRI showed indeterminate 1.6 cm non-mass enhancement in the upper RIGHT breast at middle depth at the 12 o'clock location, biopsy was benign.  - She underwent a lumpectomy and sentinel lymph node biopsy on November 25, 2023.  Surgical path showed 2.2 cm invasive lobular carcinoma, and a 1 positive sentinel lymph node.  Margins were negative. -She declined adjuvant radiation -I strongly recommend adjuvant antiestrogen therapy.  Assessment & Plan Stage IB estrogen receptor positive right breast cancer, post-lumpectomy, node-positive She is currently cancer free following lumpectomy for node-positive, ER+ breast cancer, but remains at increased risk for recurrence due  to nodal involvement. She did not receive radiation therapy due to concern for exacerbation of multiple sclerosis. Adjuvant endocrine therapy is indicated to reduce recurrence risk. Exemestane  was recommended as first-line due to her risk profile, with tamoxifen discussed as an alternative. Tamoxifen offers bone-protective effects but increases risk for venous thromboembolism and endometrial cancer, whereas exemestane  may cause arthralgias and further bone loss, necessitating concurrent bone-strengthening therapy. - Discussed tamoxifen as an alternative, including its benefits for bone density and risks of venous thromboembolism and endometrial cancer. - Instructed her to report any side effects or intolerance to exemestane ; will consider tamoxifen if exemestane  is not tolerated. - Scheduled follow-up for June 12, 2024, to monitor response and tolerance to therapy. - Encouraged regular breast self-examination.  Osteoporosis She has osteoporosis, most pronounced in the left hip, and is not currently receiving bone-strengthening therapy. She is at increased risk for further bone loss due to planned aromatase inhibitor therapy. Zometa (zoledronic acid) infusion was recommended to counteract bone loss and strengthen bone. She has not had a recent bone density scan since 2023. - Ordered DEXA scan at [facility] in [place]; provided instructions to schedule the scan. - Recommended initiation of Zometa (zoledronic acid) infusion every six months for two years to strengthen bone and counteract bone loss from aromatase inhibitor therapy. - Scheduled Zometa infusion for June 12, 2024, to coincide with next follow-up visit.  Rule out deep vein thrombosis of the lower extremity She reports new onset of significant pain and mild edema in her lower extremity for several days, raising concern for possible DVT. She has no prior DVT. Tamoxifen would increase her risk for DVT, but exemestane  does not carry this  risk. - Ordered lower extremity Doppler ultrasound to evaluate for DVT; attempted to arrange for same-day or next-day study. - Instructed  her to remain in clinic until Doppler ultrasound availability is confirmed.  Plan - I reviewed her surgical pathology findings, and his Oncotype result. - Commend adjuvant exemestane , prescription called in today.  If she does not tolerate it well, we will change to tamoxifen. - I recommend repeating bone density scan - I recommend the infusion every 6 months for 2 years, she has full denture  - Stat Doppler to rule out a left lower extremity DVT today - Follow-up in 3 months -will message Dr. Nandigam for her follow-up for abdominal pain.   SUMMARY OF ONCOLOGIC HISTORY: Oncology History  Malignant neoplasm of lower-outer quadrant of right breast of female, estrogen receptor positive (HCC)  10/07/2023 Cancer Staging   Staging form: Breast, AJCC 8th Edition - Clinical stage from 10/07/2023: Stage IA (cT1c, cN0, cM0, G1, ER+, PR+, HER2-) - Signed by Lanny Callander, MD on 10/18/2023 Stage prefix: Initial diagnosis Histologic grading system: 3 grade system   10/17/2023 Initial Diagnosis   Malignant neoplasm of lower-outer quadrant of right breast of female, estrogen receptor positive (HCC)   11/25/2023 Cancer Staging   Staging form: Breast, AJCC 8th Edition - Pathologic stage from 11/25/2023: Stage IB (pT2, pN1a(sn), cM0, G2, ER+, PR+, HER2-, Oncotype DX score: 9) - Signed by Lanny Callander, MD on 03/27/2024 Method of lymph node assessment: Sentinel lymph node biopsy Multigene prognostic tests performed: Oncotype DX Recurrence score range: Less than 11 Histologic grading system: 3 grade system      Discussed the use of AI scribe software for clinical note transcription with the patient, who gave verbal consent to proceed.  History of Present Illness Karina [name] is a 69 year old woman with ER+ right breast cancer, status post-lumpectomy, who presents for  oncology follow-up and evaluation of new abdominal and right lower extremity pain.  She is about four months post-lumpectomy for right breast cancer, with a 2.2 cm tumor and one of two axillary nodes positive for metastasis. She did not receive adjuvant radiation due to multiple sclerosis and has not started adjuvant endocrine therapy. CT on March 21, 2024 showed no recurrent or metastatic disease.  Over the past month she has had persistent lower abdominal pain, similar to menstrual cramps, up to 9/10 after eating. The pain is constant, worsens with oral intake, and is associated with decreased appetite, weight loss, intermittent distension, and alternating constipation and diarrhea. Dulcolax provides partial relief. She has diverticulosis but feels this pain is different. She denies vaginal bleeding, discharge, pruritus, or pain but notes a sweet vaginal odor. She has not had a recent gynecologic exam or Pap smear.  For several days she has had new severe right lower extremity pain with mild swelling. She is unsure of any prior thromboembolic events. She also has chronic arthralgias in her hands, hips, and knees and osteoporosis affecting the left hip, and she is not on systemic therapy for arthritis or bone-strengthening medication.     All other systems were reviewed with the patient and are negative.  MEDICAL HISTORY:  Past Medical History:  Diagnosis Date   Allergic rhinitis    Allergy    Anxiety    [facility], no per pt   Asthma    Breast cancer [facility])    Sept 2025   Cataract    Colon polyps 08/2008   Adenomatous   Complication of anesthesia    violent when waking up   COPD (chronic obstructive pulmonary disease) (HCC)    Depression    no per pt  Diabetes mellitus    Gastritis 08/2008   H. pylori   GERD (gastroesophageal reflux disease)    Hypercholesteremia    Hypercholesteremia    Lung nodule    Menopausal disorder    Multiple sclerosis    Neuromuscular  disorder (HCC)    Neuropathy    Plantar fasciitis    Seizures (HCC)    1 seizure in 2001 due to blood sugar dropping to 38, none since   Shingles    Left V1   Ulcerative colitis (HCC)    Urinary incontinence    Vertigo     SURGICAL HISTORY: Past Surgical History:  Procedure Laterality Date   BREAST BIOPSY Right 10/07/2023   US  RT BREAST BX W LOC DEV 1ST LESION IMG BX SPEC US  GUIDE 10/07/2023 [facility]   BREAST BIOPSY  11/25/2023   US  RT RADIOACTIVE SEED LOC 11/25/2023 [facility]   BREAST LUMPECTOMY WITH RADIOACTIVE SEED AND SENTINEL LYMPH NODE BIOPSY Right 11/25/2023   Procedure: BREAST LUMPECTOMY WITH RADIOACTIVE SEED AND SENTINEL LYMPH NODE BIOPSY;  Surgeon: Curvin Mt III, MD;  Location: [name];  Service: General;  Laterality: Right;  GEN w/PEC BLOCK RIGHT BREAST RADIOACTIVE SEED LOCALIZED central LUMPECTOMY SENTINEL NODE BIOPSY   CATARACT EXTRACTION Bilateral 2023   CERVICAL DISCECTOMY  03/08/2002   x 2, Anterior; Fusion C4-5, C5-6, C6-7, Dr. Arley Helling   CHEST CT  11/06/2008   With small 4 mm nodule L lung base (rec re check in 1 year)   CHEST CT  07/06/2009   Re check chest CT - lung nodule stable   CHOLECYSTECTOMY     COLONOSCOPY  08/06/2008   Polyps/ re check 5 yrs   CT SINUS LTD W/O CM  02/05/2009   negative   DILATION AND CURETTAGE OF UTERUS     ESOPHAGOGASTRODUODENOSCOPY  08/06/2008   Erosive gastritis, h pylori (treated)   FOOT SURGERY     left plantar fascial problem   ROTATOR CUFF REPAIR     right   TONSILLECTOMY     TUBAL LIGATION     UPPER GASTROINTESTINAL ENDOSCOPY      I have reviewed the social history and family history with the patient and they are unchanged from previous note.  ALLERGIES:  is allergic to atorvastatin, percocet [oxycodone-acetaminophen ], tecfidera [dimethyl fumarate], amitriptyline hcl, betaseron [interferon beta-1b], duloxetine, pregabalin, zoloft  [sertraline  hcl], crestor  [rosuvastatin ],  clarithromycin, levofloxacin, and pseudoephedrine.  MEDICATIONS:  Current Outpatient Medications  Medication Sig Dispense Refill   exemestane  (AROMASIN ) 25 MG tablet Take 1 tablet (25 mg total) by mouth daily after breakfast. 30 tablet 3   albuterol  (VENTOLIN  HFA) 108 (90 Base) MCG/ACT inhaler INHALE 2 PUFFS UP TO EVERY 4 HOURS AS NEEDED FOR WHEEZING 3 each 3   amoxicillin -clavulanate (AUGMENTIN ) 875-125 MG tablet Take 1 tablet by mouth 2 (two) times daily. 20 tablet 0   aspirin  EC 81 MG tablet Take 1 tablet (81 mg total) by mouth daily. Swallow whole. 90 tablet 3   budesonide -formoterol  (SYMBICORT ) 160-4.5 MCG/ACT inhaler Inhale 2 puffs into the lungs 2 (two) times daily. (Patient taking differently: Inhale 2 puffs into the lungs 2 (two) times daily as needed.) 3 each 1   cholecalciferol (VITAMIN D3) 25 MCG (1000 UNIT) tablet Take 1,000 Units by mouth daily.     fluticasone  (FLONASE ) 50 MCG/ACT nasal spray Place 2 sprays into both nostrils daily. (Patient taking differently: Place 2 sprays into both nostrils daily as needed.) 16 g 11   gabapentin  (NEURONTIN ) 300  MG capsule TAKE 2 CAPSULES BY MOUTH AT BEDTIME 180 capsule 1   ipratropium-albuterol  (DUONEB) 0.5-2.5 (3) MG/3ML SOLN TAKE 3 MLS BY NEBULIZATION EVERY 6 (SIX) HOURS AS NEEDED. 360 mL 0   methylcellulose (CITRUCEL) oral powder 1 tsp daily     Multiple Vitamin (MULTIVITAMIN) tablet Take 1 tablet by mouth daily.     nicotine (NICODERM CQ - DOSED IN MG/24 HOURS) 21 mg/24hr patch Place 21 mg onto the skin daily.     pantoprazole  (PROTONIX ) 40 MG tablet Take 1 tablet (40 mg total) by mouth daily before breakfast.     rosuvastatin  (CRESTOR ) 10 MG tablet Take 1 tablet (10 mg total) by mouth daily. 90 tablet 3   Spacer/Aero-Holding Chambers (AEROCHAMBER MV) inhaler Use as instructed 1 each 0   No current facility-administered medications for this visit.    PHYSICAL EXAMINATION: ECOG PERFORMANCE STATUS: 1 - Symptomatic but completely  ambulatory  Vitals:   03/27/24 1017  BP: 130/80  Pulse: 79  Resp: 17  Temp: (!) 97.5 F (36.4 C)  SpO2: 97%   Wt Readings from Last 3 Encounters:  03/27/24 176 lb 6.4 oz (80 kg)  03/19/24 176 lb 4 oz (79.9 kg)  03/16/24 175 lb (79.4 kg)     GENERAL:alert, no distress and comfortable SKIN: skin color, texture, turgor are normal, no rashes or significant lesions EYES: normal, Conjunctiva are pink and non-injected, sclera clear NECK: supple, thyroid  normal size, non-tender, without nodularity LYMPH:  no palpable lymphadenopathy in the cervical, axillary  LUNGS: clear to auscultation and percussion with normal breathing effort HEART: regular rate & rhythm and no murmurs and no lower extremity edema ABDOMEN:abdomen soft, with diffuse tenderness and normal bowel sounds Musculoskeletal:no cyanosis of digits and no clubbing  NEURO: alert & oriented x 3 with fluent speech, no focal motor/sensory deficits  Physical Exam   LABORATORY DATA:  I have reviewed the data as listed    Latest Ref Rng & Units 03/19/2024   11:28 AM 10/19/2023   12:30 PM 07/07/2023   11:46 AM  CBC  WBC 4.0 - 10.5 K/uL 6.9  7.3  7.2   Hemoglobin 12.0 - 15.0 g/dL 85.9  85.6  84.7   Hematocrit 36.0 - 46.0 % 41.5  43.7  46.8   Platelets 150.0 - 400.0 K/uL 222.0  242  235         Latest Ref Rng & Units 03/19/2024   11:28 AM 10/19/2023   12:30 PM 07/07/2023   11:46 AM  CMP  Glucose 70 - 99 mg/dL 94  89  75   BUN 6 - 23 mg/dL 14  10  10    Creatinine 0.40 - 1.20 mg/dL 8.91  9.01  8.84   Sodium 135 - 145 mEq/L 139  138  139   Potassium 3.5 - 5.1 mEq/L 4.6  4.7  5.2   Chloride 96 - 112 mEq/L 101  101  100   CO2 19 - 32 mEq/L 31  33  26   Calcium  8.4 - 10.5 mg/dL 9.6  9.7  9.9   Total Protein 6.0 - 8.3 g/dL 7.0  7.3  7.3   Total Bilirubin 0.2 - 1.2 mg/dL 0.5  0.4  0.3   Alkaline Phos 39 - 117 U/L 68  77  87   AST 5 - 37 U/L 16  16  18    ALT 3 - 35 U/L 11  12  13        RADIOGRAPHIC STUDIES: I have  personally  reviewed the radiological images as listed and agreed with the findings in the report. VAS US  LOWER EXTREMITY VENOUS (DVT) Result Date: 03/27/2024  Lower Venous DVT Study Patient Name:  Karina [name]  Date of Exam:   03/27/2024 Medical Rec #: [id number]       Accession #:    [id number] Date of Birth: [birth year]      Patient Gender: F Patient Age:   69 years Exam Location:  [address] Procedure:      VAS US  LOWER EXTREMITY VENOUS (DVT) Referring Phys: ONITA MATTOCK --------------------------------------------------------------------------------  Indications: Edema.  Comparison Study: No prior study Performing Technologist: Rosaline Fujisawa MHA, RDMS, RVT, RDCS  Examination Guidelines: A complete evaluation includes B-mode imaging, spectral Doppler, color Doppler, and power Doppler as needed of all accessible portions of each vessel. Bilateral testing is considered an integral part of a complete examination. Limited examinations for reoccurring indications may be performed as noted. The reflux portion of the exam is performed with the patient in reverse Trendelenburg.  +-----+---------------+---------+-----------+----------+--------------+ RIGHTCompressibilityPhasicitySpontaneityPropertiesThrombus Aging +-----+---------------+---------+-----------+----------+--------------+ CFV  Full           Yes      Yes                                 +-----+---------------+---------+-----------+----------+--------------+   +---------+---------------+---------+-----------+----------+--------------+ LEFT     CompressibilityPhasicitySpontaneityPropertiesThrombus Aging +---------+---------------+---------+-----------+----------+--------------+ CFV      Full           Yes      Yes                                 +---------+---------------+---------+-----------+----------+--------------+ SFJ      Full                                                         +---------+---------------+---------+-----------+----------+--------------+ FV Prox  Full                                                        +---------+---------------+---------+-----------+----------+--------------+ FV Mid   Full           Yes      Yes                                 +---------+---------------+---------+-----------+----------+--------------+ FV DistalFull                                                        +---------+---------------+---------+-----------+----------+--------------+ PFV      Full                                                        +---------+---------------+---------+-----------+----------+--------------+  POP      Full           Yes      Yes                                 +---------+---------------+---------+-----------+----------+--------------+ PTV      Full                                                        +---------+---------------+---------+-----------+----------+--------------+ PERO     Full                                                        +---------+---------------+---------+-----------+----------+--------------+     Summary: RIGHT: - No evidence of common femoral vein obstruction.  LEFT: - There is no evidence of deep vein thrombosis in the lower extremity.  - No cystic structure found in the popliteal fossa.  *See table(s) above for measurements and observations. Electronically signed by Penne [place] MD on 03/27/2024 at 4:26:30 PM.    Final       Orders Placed This Encounter  Procedures   DG Bone Density    Standing Status:   Future    Expected Date:   04/10/2024    Expiration Date:   03/27/2025    Reason for Exam (SYMPTOM  OR DIAGNOSIS REQUIRED):   screening    Preferred imaging location?:   [facility]   All questions were answered. The patient knows to call the clinic with any problems, questions or concerns. No barriers to learning was detected. The total time spent in the appointment  was 40 minutes, including review of chart and various tests results, discussions about plan of care and coordination of care plan     Onita Mattock, MD 03/27/2024     "

## 2024-03-29 ENCOUNTER — Ambulatory Visit: Admitting: Occupational Therapy

## 2024-04-02 ENCOUNTER — Ambulatory Visit: Admitting: Occupational Therapy

## 2024-04-09 ENCOUNTER — Ambulatory Visit: Admitting: Occupational Therapy

## 2024-04-12 ENCOUNTER — Ambulatory Visit: Admitting: Occupational Therapy

## 2024-04-16 ENCOUNTER — Ambulatory Visit: Admitting: Occupational Therapy

## 2024-04-19 ENCOUNTER — Other Ambulatory Visit

## 2024-04-23 ENCOUNTER — Ambulatory Visit: Admitting: Occupational Therapy

## 2024-04-26 ENCOUNTER — Ambulatory Visit: Admitting: Occupational Therapy

## 2024-06-12 ENCOUNTER — Inpatient Hospital Stay

## 2024-06-12 ENCOUNTER — Inpatient Hospital Stay: Attending: Hematology

## 2024-06-12 ENCOUNTER — Inpatient Hospital Stay: Admitting: Nurse Practitioner

## 2025-01-28 ENCOUNTER — Ambulatory Visit: Admitting: Adult Health
